# Patient Record
Sex: Female | Born: 1940 | Race: White | Hispanic: No | Marital: Married | Smoking: Former smoker
Health system: Southern US, Community
[De-identification: ages and names within clinical notes are randomized; demographics above are authoritative.]

## PROBLEM LIST (undated history)

## (undated) DIAGNOSIS — M810 Age-related osteoporosis without current pathological fracture: Secondary | ICD-10-CM

## (undated) DIAGNOSIS — R64 Cachexia: Secondary | ICD-10-CM

## (undated) DIAGNOSIS — I10 Essential (primary) hypertension: Secondary | ICD-10-CM

## (undated) DIAGNOSIS — E119 Type 2 diabetes mellitus without complications: Secondary | ICD-10-CM

## (undated) DIAGNOSIS — I639 Cerebral infarction, unspecified: Secondary | ICD-10-CM

## (undated) DIAGNOSIS — Z85818 Personal history of malignant neoplasm of other sites of lip, oral cavity, and pharynx: Secondary | ICD-10-CM

## (undated) HISTORY — PX: ABDOMINAL HYSTERECTOMY: SHX81

## (undated) HISTORY — PX: PAROTIDECTOMY: SUR1003

## (undated) HISTORY — PX: PEG PLACEMENT: SHX5437

## (undated) HISTORY — PX: CHOLECYSTECTOMY: SHX55

## (undated) HISTORY — DX: Age-related osteoporosis without current pathological fracture: M81.0

## (undated) HISTORY — DX: Type 2 diabetes mellitus without complications: E11.9

## (undated) HISTORY — DX: Essential (primary) hypertension: I10

---

## 2002-12-17 HISTORY — PX: ANKLE SURGERY: SHX546

## 2003-01-11 ENCOUNTER — Encounter: Payer: Self-pay | Admitting: Emergency Medicine

## 2003-01-11 ENCOUNTER — Encounter: Payer: Self-pay | Admitting: Orthopaedic Surgery

## 2003-01-11 ENCOUNTER — Inpatient Hospital Stay (HOSPITAL_COMMUNITY): Admission: EM | Admit: 2003-01-11 | Discharge: 2003-01-15 | Payer: Self-pay | Admitting: Emergency Medicine

## 2003-01-12 ENCOUNTER — Encounter: Payer: Self-pay | Admitting: Orthopaedic Surgery

## 2003-12-27 ENCOUNTER — Ambulatory Visit (HOSPITAL_COMMUNITY): Admission: RE | Admit: 2003-12-27 | Discharge: 2003-12-27 | Payer: Self-pay | Admitting: Family Medicine

## 2004-09-06 ENCOUNTER — Ambulatory Visit (HOSPITAL_COMMUNITY): Admission: RE | Admit: 2004-09-06 | Discharge: 2004-09-06 | Payer: Self-pay | Admitting: Family Medicine

## 2005-01-16 ENCOUNTER — Ambulatory Visit (HOSPITAL_COMMUNITY): Admission: RE | Admit: 2005-01-16 | Discharge: 2005-01-16 | Payer: Self-pay | Admitting: Family Medicine

## 2005-06-16 ENCOUNTER — Emergency Department (HOSPITAL_COMMUNITY): Admission: EM | Admit: 2005-06-16 | Discharge: 2005-06-16 | Payer: Self-pay | Admitting: *Deleted

## 2005-06-21 ENCOUNTER — Ambulatory Visit (HOSPITAL_COMMUNITY): Admission: RE | Admit: 2005-06-21 | Discharge: 2005-06-21 | Payer: Self-pay | Admitting: Family Medicine

## 2005-08-14 ENCOUNTER — Ambulatory Visit: Payer: Self-pay | Admitting: Family Medicine

## 2006-05-06 ENCOUNTER — Ambulatory Visit (HOSPITAL_COMMUNITY): Admission: RE | Admit: 2006-05-06 | Discharge: 2006-05-06 | Payer: Self-pay | Admitting: Family Medicine

## 2006-06-21 ENCOUNTER — Ambulatory Visit (HOSPITAL_COMMUNITY): Admission: RE | Admit: 2006-06-21 | Discharge: 2006-06-21 | Payer: Self-pay | Admitting: Gastroenterology

## 2006-06-21 ENCOUNTER — Ambulatory Visit: Payer: Self-pay | Admitting: Gastroenterology

## 2006-10-01 ENCOUNTER — Ambulatory Visit: Payer: Self-pay | Admitting: Family Medicine

## 2007-10-16 ENCOUNTER — Ambulatory Visit: Payer: Self-pay | Admitting: Family Medicine

## 2007-12-18 HISTORY — PX: COLONOSCOPY: SHX174

## 2008-06-16 DIAGNOSIS — M810 Age-related osteoporosis without current pathological fracture: Secondary | ICD-10-CM

## 2008-06-16 HISTORY — DX: Age-related osteoporosis without current pathological fracture: M81.0

## 2008-06-25 ENCOUNTER — Ambulatory Visit (HOSPITAL_COMMUNITY): Admission: RE | Admit: 2008-06-25 | Discharge: 2008-06-25 | Payer: Self-pay | Admitting: Family Medicine

## 2008-11-03 ENCOUNTER — Ambulatory Visit (HOSPITAL_COMMUNITY): Admission: RE | Admit: 2008-11-03 | Discharge: 2008-11-03 | Payer: Self-pay | Admitting: Family Medicine

## 2008-12-15 ENCOUNTER — Ambulatory Visit (HOSPITAL_COMMUNITY): Admission: RE | Admit: 2008-12-15 | Discharge: 2008-12-15 | Payer: Self-pay | Admitting: Family Medicine

## 2009-11-04 ENCOUNTER — Ambulatory Visit (HOSPITAL_COMMUNITY): Admission: RE | Admit: 2009-11-04 | Discharge: 2009-11-04 | Payer: Self-pay | Admitting: Family Medicine

## 2010-01-15 ENCOUNTER — Emergency Department (HOSPITAL_COMMUNITY): Admission: EM | Admit: 2010-01-15 | Discharge: 2010-01-15 | Payer: Self-pay | Admitting: Emergency Medicine

## 2010-11-14 ENCOUNTER — Ambulatory Visit (HOSPITAL_COMMUNITY)
Admission: RE | Admit: 2010-11-14 | Discharge: 2010-11-14 | Payer: Self-pay | Source: Home / Self Care | Admitting: Family Medicine

## 2010-11-28 ENCOUNTER — Ambulatory Visit (HOSPITAL_COMMUNITY)
Admission: RE | Admit: 2010-11-28 | Discharge: 2010-11-28 | Payer: Self-pay | Source: Home / Self Care | Attending: Family Medicine | Admitting: Family Medicine

## 2011-01-06 ENCOUNTER — Encounter: Payer: Self-pay | Admitting: Family Medicine

## 2011-05-04 NOTE — H&P (Signed)
NAME:  Laura Melendez, Laura Melendez                         ACCOUNT NO.:  [id number]   MEDICAL RECORD NO.:  [id number]                   PATIENT TYPE:  INP   LOCATION:  A329                                 FACILITY:  [facility]   PHYSICIAN:  J. Darreld Mclean, M.D.              DATE OF BIRTH:  02/08/1941   DATE OF ADMISSION:  01/11/2003  DATE OF DISCHARGE:                                HISTORY & PHYSICAL   CHIEF COMPLAINT:  I fell and hurt my ankle.   HISTORY:  The patient was at home vacuuming and tripped over her vacuum  cleaner hose and fell and twisted her ankle.  She heard a pop.  She is 70 years old.  No other injuries except for her ankle.  X-rays showed a  displaced, dislocated ankle with trimalleolar fracture on the left.   PAST SURGICAL HISTORY:  1. Cholecystectomy.  2. Appendectomy.  3. Breast surgery, biopsy on the right.  4. Partial hysterectomy.   MEDICATIONS:  She a medicine for hypertension, she does not know the name,  and she takes vitamins.   ALLERGIES:  None known.   PAST MEDICAL HISTORY:  Negative except for the history of hypertension.  She  has no serious medical problems, very active.   FAMILY DOCTOR:  Dr. Hyacinth Meeker in [place], [place] - local physician.   SOCIAL HISTORY:  The patient is married and has a son, and both are present.   PHYSICAL EXAMINATION:  GENERAL:  The patient is alert, cooperative and  oriented.  HEENT:  Negative.  NECK:  Supple.  LUNGS:  Clear to P&A.  HEART:  Regular rhythm without murmur.  ABDOMEN:  Soft, nontender, without masses.  EXTREMITIES:  Left ankle now in a posterior splint, previously relocated it  and closed reduction maneuver.  NEUROVASCULAR SYSTEM:  Intact.  LOWER EXTREMITIES:  Within normal limits.  CNS:  Intact.  SKIN:  Intact.   IMPRESSION:  1. Dislocation left ankle, now reduced.  2. Trimalleolar fracture left ankle for open reduction and internal     fixation.  3. History of hypertension.   PLAN:  Open reduction and  internal fixation of the left ankle.  I have  discussed with the patient the planned procedure, risks and imponderables  including infection, pulmonary embolism which leads to death, need for  physical therapy, use of a Johnstone, limited weightbearing, if any, and  anesthesia risks among other.  The patient appears to understand and agrees  to procedure as outlined.  Lab pending.  Will need a consult with  hospitalist.                                               [name], M.D.    JWK/MEDQ  D:  01/11/2003  T:  01/11/2003  Job:  [id number]

## 2011-05-04 NOTE — Op Note (Signed)
NAME:  [name], [name]                         ACCOUNT NO.:  [id number]   MEDICAL RECORD NO.:  [id number]                   PATIENT TYPE:  INP   LOCATION:  A329                                 FACILITY:  [facility]   PHYSICIAN:  J. Darreld Mclean, M.D.              DATE OF BIRTH:  10-May-1941   DATE OF PROCEDURE:  01/12/2003  DATE OF DISCHARGE:                                 OPERATIVE REPORT   PREOPERATIVE DIAGNOSIS:  Trimalleolar fracture of the left ankle.   POSTOPERATIVE DIAGNOSIS:  Trimalleolar fracture of the left ankle.   PROCEDURE:  Open treatment and internal fixation of left ankle trimalleolar  fracture (this patient has previously had relocation and dislocation of an  ankle done yesterday).   ANESTHESIA:  Spinal.   TOURNIQUET TIME:  30 minutes.   DRAINS:  None.   SPLINTS:  A posterior splint applied, plaster type.   SURGEON:  J. Darreld Mclean, M.D.   ASSISTANT:  Candace Cruise, P.A.   INDICATIONS:  The patient is a 70 year old female who fell over her vacuum  cleaner yesterday afternoon and injured her left ankle.  No other injury  sustained.  Risks and imponderables of the procedure have been discussed  with the patient preoperatively.  She and her husband have both had the  opportunity to ask appropriate questions concerning the procedure.  They  appear to understand.   DESCRIPTION OF PROCEDURE:  The patient was given spinal anesthesia and  placed supine on the operating table.  Tourniquet placed deflated on the  left upper thigh.  Sand bag placed under the padding of the table in the  left hip area.  The patient prepped and draped in the usual manner, leg  elevated, wrapped circumferentially with an Esmarch bandage, tourniquet  inflated to 300 mmHg, the Esmarch bandage removed.  We re-verified we were  doing this patient and the left ankle.  An incision was made medially and  the fracture site  was identified.  It was cleaned of hematoma.  The talus  appeared to  have no injury.  The fracture was anatomically reduced, held in  place with a clamp, and a 0.062 smooth Kirschner wire was placed and then a  3.2 drill was used and a 55 mm long malleolar screw was used.  Attention  directed to the lateral side.  The fracture was identified and debrided of  hematoma, held in place with a clamp, and a 2.5 drill bit was used and then  a 3.5 tap.  A 3.5 and 16 mm long cortical screw was then used.  This held  the fracture in good position.  X-rays taken in the [facility] and lateral views,  and these looked very good.  Showed reduction of the fracture and  restoration of the mortise.  The wound was reapproximated using a 2-0  chromic, 2-0 plain, and skin staples in both sides.  Sterile dressing  applied,  bulky dressing applied.  Tourniquet deflated for 30 minutes.  Posterior splint applied.  The patient tolerated the procedure well and went  to recovery in good condition.  She tolerated it well.                                                [name], M.D.    JWK/MEDQ  D:  01/12/2003  T:  01/12/2003  Job:  [id number]

## 2011-05-04 NOTE — Op Note (Signed)
NAMEHARTLYN, Laura Melendez               ACCOUNT NO.:  [id number]   MEDICAL RECORD NO.:  [id number]          PATIENT TYPE:  AMB   LOCATION:  DAY                           FACILITY:  [facility]   PHYSICIAN:  Kassie Mends, M.D.      DATE OF BIRTH:  [birth year]   DATE OF PROCEDURE:  06/21/2006  DATE OF DISCHARGE:                                 OPERATIVE REPORT   PROCEDURE:  Colonoscopy.   MEDICATIONS:  1.  Demerol 50 mg IV.  2.  Versed 3 mg IV.   ENDOSCOPIST:  Kassie Mends, M.D.   INDICATIONS FOR PROCEDURE:  Laura Melendez is a 70 year old female who presents  for average risk colon cancer screening.   FINDINGS:  1.  Sigmoid colon diverticulosis, mild.  Otherwise no evidence of polyps,      masses, inflammatory changes or vascular ectasias.  2.  Normal retroflexed view of the rectum.   RECOMMENDATIONS:  1.  High fiber diet.  2.  Repeat screening colonoscopy in 10 years.  3.  Recommend halving the dose of metoprolol and Norvasc on the day prior to      her colonoscopy because during the procedure she had episodes of      bradycardia into the 30's and systolic blood pressure 77, which      responded to fluid bolus and stimulation.  4.  Follow up with Dr. Lorin Picket A. Luking.   DESCRIPTION OF PROCEDURE:  The physical exam was performed and an informed  consent was obtained from the patient after explaining all risks,  (perforation, bleeding, infection and adverse effects to the medicines),  benefits and alternatives to the procedure, which the patient appeared to  understand and so stated.  The patient was connected to a monitoring device  and placed in the left lateral position.  Continuous oxygen was provided by  nasal cannula and IV medicine administered through an indwelling cannula.  After the administration of sedation as outlined above,  and a rectal exam, the patient was intubated and the scope was advanced  under direct visualization to the cecum.  The scope was subsequently removed  slowly  by careful exam of the colon texture, anatomy and integrity of the  mucosal layout.  The patient was recovered in the endoscopy suite and  discharged to home in satisfactory condition.      Kassie Mends, M.D.  Electronically Signed     SM/MEDQ  D:  06/21/2006  T:  06/21/2006  Job:  [id number]   cc:   Lorin Picket A. Gerda Diss, MD  Fax: [phone]

## 2011-05-04 NOTE — Discharge Summary (Signed)
   NAME:  Laura Melendez, Laura Melendez                         ACCOUNT NO.:  [id number]   MEDICAL RECORD NO.:  [id number]                   PATIENT TYPE:  INP   LOCATION:  A329                                 FACILITY:  [facility]   PHYSICIAN:  J. Darreld Mclean, M.D.              DATE OF BIRTH:  12-03-1941   DATE OF ADMISSION:  01/11/2003  DATE OF DISCHARGE:  01/15/2003                                 DISCHARGE SUMMARY   DISCHARGE DIAGNOSES:  Trimalleolar fracture of the left ankle.   PROCEDURE:  Open treatment, internal fixation left trimalleolar fracture.   OTHER PROCEDURE:  Relocation of dislocated left ankle.   OTHER DIAGNOSIS:  Dislocated left ankle.   DISCHARGE STATUS:  Improved.   PROGNOSIS:  Good.   DISPOSITION:  Home.   DISCHARGE MEDICATIONS:  Tylox, Lopressor, and hydrochlorothiazide.   DISCHARGE INSTRUCTIONS:  The patient to be seen in my office on January 29, 2003 at 10 a.m.  Home health has been arranged; a [facility] has been provided.   HOSPITAL COURSE:  The patient fell and sustained the above-mentioned injury.  She had a dislocated trimalleolar fracture of the ankle.  The ankle was  relocated in the ER.  The patient was admitted, seen in consultation by Dr.  Letitia Libra, found to be candidate for surgery.  She underwent the surgery on  the day after admission and she tolerated it well.  The next day she was  afebrile.  IVs were discontinued and physical therapy was begun.  The  patient did well in therapy on the first day and the second day she resumed  and did even better.  She was able to go home on January 30.  She was  afebrile, doing well, minimal pain, minimal difficulty.  She is to be seen  in the office as stated.   LABORATORY DATA:  Within normal limits.                                               [name], M.D.    JWK/MEDQ  D:  01/26/2003  T:  01/26/2003  Job:  [id number]

## 2011-11-15 ENCOUNTER — Other Ambulatory Visit: Payer: Self-pay | Admitting: Family Medicine

## 2011-11-15 DIAGNOSIS — Z139 Encounter for screening, unspecified: Secondary | ICD-10-CM

## 2011-11-20 ENCOUNTER — Ambulatory Visit (HOSPITAL_COMMUNITY)
Admission: RE | Admit: 2011-11-20 | Discharge: 2011-11-20 | Disposition: A | Discharge status: Home / Self Care | Payer: Medicare HMO | Source: Ambulatory Visit | Attending: Family Medicine | Admitting: Family Medicine

## 2011-11-20 DIAGNOSIS — Z139 Encounter for screening, unspecified: Secondary | ICD-10-CM

## 2011-11-20 DIAGNOSIS — Z1231 Encounter for screening mammogram for malignant neoplasm of breast: Secondary | ICD-10-CM | POA: Insufficient documentation

## 2012-11-04 ENCOUNTER — Other Ambulatory Visit: Payer: Self-pay | Admitting: Family Medicine

## 2012-11-04 DIAGNOSIS — Z139 Encounter for screening, unspecified: Secondary | ICD-10-CM

## 2012-11-25 ENCOUNTER — Ambulatory Visit (HOSPITAL_COMMUNITY)
Admission: RE | Admit: 2012-11-25 | Discharge: 2012-11-25 | Disposition: A | Discharge status: Home / Self Care | Payer: Medicare Other | Source: Ambulatory Visit | Attending: Family Medicine | Admitting: Family Medicine

## 2012-11-25 DIAGNOSIS — Z1231 Encounter for screening mammogram for malignant neoplasm of breast: Secondary | ICD-10-CM | POA: Insufficient documentation

## 2012-11-25 DIAGNOSIS — Z139 Encounter for screening, unspecified: Secondary | ICD-10-CM

## 2012-11-25 LAB — HM MAMMOGRAPHY

## 2013-01-06 ENCOUNTER — Other Ambulatory Visit: Payer: Self-pay | Admitting: Family Medicine

## 2013-01-06 DIAGNOSIS — M810 Age-related osteoporosis without current pathological fracture: Secondary | ICD-10-CM

## 2013-01-08 ENCOUNTER — Ambulatory Visit (HOSPITAL_COMMUNITY)
Admission: RE | Admit: 2013-01-08 | Discharge: 2013-01-08 | Disposition: A | Discharge status: Home / Self Care | Payer: Medicare Other | Source: Ambulatory Visit | Attending: Family Medicine | Admitting: Family Medicine

## 2013-01-08 DIAGNOSIS — M810 Age-related osteoporosis without current pathological fracture: Secondary | ICD-10-CM

## 2013-02-23 ENCOUNTER — Encounter (HOSPITAL_COMMUNITY): Payer: Self-pay

## 2013-03-04 ENCOUNTER — Encounter (HOSPITAL_COMMUNITY): Payer: Self-pay

## 2013-03-04 ENCOUNTER — Encounter (HOSPITAL_COMMUNITY)
Admission: RE | Admit: 2013-03-04 | Discharge: 2013-03-04 | Disposition: A | Discharge status: Home / Self Care | Payer: Medicare Other | Source: Ambulatory Visit | Attending: Obstetrics & Gynecology | Admitting: Obstetrics & Gynecology

## 2013-03-04 ENCOUNTER — Other Ambulatory Visit: Payer: Self-pay

## 2013-03-04 LAB — BASIC METABOLIC PANEL
BUN: 12 mg/dL (ref 6–23)
CO2: 27 mEq/L (ref 19–32)
Chloride: 101 mEq/L (ref 96–112)
GFR calc Af Amer: >90 mL/min (ref >90)

## 2013-03-04 LAB — CBC
HCT: 41.3 % (ref 36.0–46.0)
Hemoglobin: 13.9 g/dL (ref 12.0–15.0)
MCHC: 33.7 g/dL (ref 30.0–36.0)
Platelets: 237 10*3/uL (ref 150–400)

## 2013-03-04 NOTE — Pre-Procedure Instructions (Signed)
EKG of today (03/04/13) reviewed by Dr Rodman Pickle. More cardiac information needed. Called request to Dr Gerda Diss, [facility].

## 2013-03-04 NOTE — Patient Instructions (Addendum)
20 Laura Melendez  03/04/2013   Your procedure is scheduled on:  03/11/13  Enter through the Main Entrance of [facility] at 6 AM.  Pick up the phone at the desk and dial [phone].   Call this number if you have problems the morning of surgery: [phone]   Remember:   Do not eat food:After Midnight.  Do not drink clear liquids: After Midnight.  Take these medicines the morning of surgery with A SIP OF WATER: all blood pressure medications, hold Metformin for 24hrs prior to surgery   Do not wear jewelry, make-up or nail polish.  Do not wear lotions, powders, or perfumes. You may wear deodorant.  Do not shave 48 hours prior to surgery.  Do not bring valuables to the hospital.  Contacts, dentures or bridgework may not be worn into surgery.  Leave suitcase in the car. After surgery it may be brought to your room.  For patients admitted to the hospital, checkout time is 11:00 AM the day of discharge.   Patients discharged the day of surgery will not be allowed to drive home.  Name and phone number of your driver: NA  Special Instructions: Shower using CHG 2 nights before surgery and the night before surgery.  If you shower the day of surgery use CHG.  Use special wash - you have one bottle of CHG for all showers.  You should use approximately 1/3 of the bottle for each shower.   Please read over the following fact sheets that you were given: Surgical Site Infection Prevention

## 2013-03-05 NOTE — Pre-Procedure Instructions (Signed)
Medications list and EKGs of '07 and 03/04/13 reviewed by Cristela Blue, MD. No instructions or orders given.

## 2013-03-10 ENCOUNTER — Encounter (HOSPITAL_COMMUNITY): Payer: Self-pay | Admitting: Anesthesiology

## 2013-03-10 NOTE — Anesthesia Preprocedure Evaluation (Addendum)
Anesthesia Evaluation  Patient identified by MRN, date of birth, ID band Patient awake    Reviewed: Allergy & Precautions, H&P , NPO status , Patient's Chart, lab work & pertinent test results  Airway Mallampati: II TM Distance: >3 FB Neck ROM: Full    Dental no notable dental hx. (+) Upper Dentures and Partial Lower   Pulmonary neg pulmonary ROS, former smoker,  breath sounds clear to auscultation  Pulmonary exam normal       Cardiovascular hypertension, Pt. on medications and Pt. on home beta blockers Rhythm:Regular Rate:Bradycardia     Neuro/Psych negative neurological ROS  negative psych ROS   GI/Hepatic negative GI ROS, Neg liver ROS,   Endo/Other  diabetes, Well Controlled, Type 2, Oral Hypoglycemic Agents  Renal/GU negative Renal ROS Bladder dysfunction Female GU complaint Vaginal Prolapse Cystocele Rectocele    Musculoskeletal negative musculoskeletal ROS (+)   Abdominal   Peds  Hematology negative hematology ROS (+)   Anesthesia Other Findings   Reproductive/Obstetrics negative OB ROS                          Anesthesia Physical Anesthesia Plan  ASA: II  Anesthesia Plan: Spinal and Combined Spinal and Epidural   Post-op Pain Management:    Induction:   Airway Management Planned: Natural Airway  Additional Equipment:   Intra-op Plan:   Post-operative Plan:   Informed Consent: I have reviewed the patients History and Physical, chart, labs and discussed the procedure including the risks, benefits and alternatives for the proposed anesthesia with the patient or authorized representative who has indicated his/her understanding and acceptance.   Dental advisory given  Plan Discussed with: Anesthesiologist, Surgeon and CRNA  Anesthesia Plan Comments:        Anesthesia Quick Evaluation

## 2013-03-10 NOTE — H&P (Signed)
Laura Melendez is an 72 y.o. female with cystocele desiring definitive management.  Patient declines conservative measures.  UDS showed OAB; unable to prove stress incontinence.    Pertinent Gynecological History: Menses: post-menopausal Bleeding: n/a Contraception: status post hysterectomy DES exposure: unknown Blood transfusions: none Sexually transmitted diseases: no past history Previous GYN Procedures: hysterectomy  Last mammogram: normal Date: 2014 Last pap: normal Date: 2013 OB History: G3, P4  Menstrual History: Menarche age: n/a No LMP recorded. Patient has had a hysterectomy.    Past Medical History  Diagnosis Date  . Hypertension   . Diabetes mellitus without complication     Past Surgical History  Procedure Laterality Date  . Cholecystectomy    . Ankle surgery    . Abdominal hysterectomy      No family history on file.  Social History:  reports that she has quit smoking. She does not have any smokeless tobacco history on file. She reports that she does not drink alcohol or use illicit drugs.  Allergies: No Known Allergies  No prescriptions prior to admission    ROS  There were no vitals taken for this visit. Physical Exam  Constitutional: She is oriented to person, place, and time. She appears well-developed and well-nourished.  Cardiovascular: Normal rate and regular rhythm.   Respiratory: Effort normal and breath sounds normal.  GI: Soft. Bowel sounds are normal. There is no tenderness. There is no rebound and no guarding.  Neurological: She is alert and oriented to person, place, and time.  Skin: Skin is warm and dry.  Psychiatric: She has a normal mood and affect. Her behavior is normal.    No results found for this or any previous visit (from the past 24 hour(s)).  No results found.  Assessment/Plan: 71 yo with symptomatic pelvic organ prolapse -Anterior/posterior colporrhaphy, sacrospinous ligament fixation  Laura Melendez 03/10/2013,  7:52 PM

## 2013-03-11 ENCOUNTER — Observation Stay (HOSPITAL_COMMUNITY)
Admission: RE | Admit: 2013-03-11 | Discharge: 2013-03-12 | Disposition: A | Discharge status: Home / Self Care | Payer: Medicare Other | Source: Ambulatory Visit | Attending: Obstetrics & Gynecology | Admitting: Obstetrics & Gynecology

## 2013-03-11 ENCOUNTER — Encounter (HOSPITAL_COMMUNITY): Payer: Self-pay | Admitting: *Deleted

## 2013-03-11 ENCOUNTER — Encounter (HOSPITAL_COMMUNITY)
Admission: RE | Disposition: A | Discharge status: Home / Self Care | Payer: Self-pay | Source: Ambulatory Visit | Attending: Obstetrics & Gynecology

## 2013-03-11 ENCOUNTER — Encounter (HOSPITAL_COMMUNITY): Payer: Self-pay | Admitting: Anesthesiology

## 2013-03-11 ENCOUNTER — Ambulatory Visit (HOSPITAL_COMMUNITY): Payer: Medicare Other | Admitting: Anesthesiology

## 2013-03-11 DIAGNOSIS — I10 Essential (primary) hypertension: Secondary | ICD-10-CM | POA: Insufficient documentation

## 2013-03-11 DIAGNOSIS — N993 Prolapse of vaginal vault after hysterectomy: Principal | ICD-10-CM | POA: Insufficient documentation

## 2013-03-11 DIAGNOSIS — E119 Type 2 diabetes mellitus without complications: Secondary | ICD-10-CM | POA: Insufficient documentation

## 2013-03-11 HISTORY — PX: ANTERIOR AND POSTERIOR REPAIR: SHX5121

## 2013-03-11 LAB — BASIC METABOLIC PANEL
BUN: 9 mg/dL (ref 6–23)
Chloride: 103 mEq/L (ref 96–112)
Creatinine, Ser: 0.49 mg/dL — ABNORMAL LOW (ref 0.50–1.10)
GFR calc Af Amer: >90 mL/min (ref >90)

## 2013-03-11 LAB — CBC
HCT: 34.9 % — ABNORMAL LOW (ref 36.0–46.0)
MCHC: 33 g/dL (ref 30.0–36.0)
MCV: 82.1 fL (ref 78.0–100.0)
RDW: 14.2 % (ref 11.5–15.5)
WBC: 11.5 10*3/uL — ABNORMAL HIGH (ref 4.0–10.5)

## 2013-03-11 LAB — GLUCOSE, CAPILLARY: Glucose-Capillary: 134 mg/dL — ABNORMAL HIGH (ref 70–99)

## 2013-03-11 SURGERY — ANTERIOR (CYSTOCELE) AND POSTERIOR REPAIR (RECTOCELE)
Anesthesia: Spinal | Site: Vagina | Wound class: Clean Contaminated

## 2013-03-11 MED ORDER — NALOXONE HCL 0.4 MG/ML IJ SOLN: 0.4000 mg | INTRAMUSCULAR | Status: DC | PRN

## 2013-03-11 MED ORDER — ONDANSETRON HCL 4 MG/2ML IJ SOLN
4.0000 mg | Freq: Three times a day (TID) | INTRAMUSCULAR | Status: DC | PRN
  Filled 2013-03-11: qty 2

## 2013-03-11 MED ORDER — KCL-LACTATED RINGERS-D5W 20 MEQ/L IV SOLN
INTRAVENOUS | Status: DC
  Administered 2013-03-11 – 2013-03-12 (×3): via INTRAVENOUS
  Filled 2013-03-11 (×6): qty 1000

## 2013-03-11 MED ORDER — DOCUSATE SODIUM 100 MG PO CAPS
100.0000 mg | ORAL_CAPSULE | Freq: Two times a day (BID) | ORAL | Status: DC
  Administered 2013-03-11 – 2013-03-12 (×3): 100 mg via ORAL
  Filled 2013-03-11 (×3): qty 1

## 2013-03-11 MED ORDER — NALBUPHINE HCL 10 MG/ML IJ SOLN
5.0000 mg | INTRAMUSCULAR | Status: DC | PRN
  Filled 2013-03-11: qty 1

## 2013-03-11 MED ORDER — LACTATED RINGERS IV SOLN
INTRAVENOUS | Status: DC | PRN
  Administered 2013-03-11 (×2): via INTRAVENOUS

## 2013-03-11 MED ORDER — LIDOCAINE HCL (CARDIAC) 20 MG/ML IV SOLN
INTRAVENOUS | Status: AC
  Filled 2013-03-11: qty 5

## 2013-03-11 MED ORDER — METOCLOPRAMIDE HCL 5 MG/ML IJ SOLN: 10.0000 mg | Freq: Three times a day (TID) | INTRAMUSCULAR | Status: DC | PRN

## 2013-03-11 MED ORDER — DIPHENHYDRAMINE HCL 25 MG PO CAPS
25.0000 mg | ORAL_CAPSULE | ORAL | Status: DC | PRN
  Filled 2013-03-11: qty 1

## 2013-03-11 MED ORDER — PROMETHAZINE HCL 25 MG/ML IJ SOLN: 12.5000 mg | Freq: Four times a day (QID) | INTRAMUSCULAR | Status: DC | PRN

## 2013-03-11 MED ORDER — FENTANYL CITRATE 0.05 MG/ML IJ SOLN
INTRAMUSCULAR | Status: AC
  Filled 2013-03-11: qty 5

## 2013-03-11 MED ORDER — ONDANSETRON HCL 4 MG/2ML IJ SOLN
INTRAMUSCULAR | Status: AC
  Filled 2013-03-11: qty 2

## 2013-03-11 MED ORDER — MEPERIDINE HCL 25 MG/ML IJ SOLN: 6.2500 mg | INTRAMUSCULAR | Status: DC | PRN

## 2013-03-11 MED ORDER — FENTANYL CITRATE 0.05 MG/ML IJ SOLN: 25.0000 ug | INTRAMUSCULAR | Status: DC | PRN

## 2013-03-11 MED ORDER — NALOXONE HCL 1 MG/ML IJ SOLN
1.0000 ug/kg/h | INTRAVENOUS | Status: DC | PRN
  Filled 2013-03-11: qty 2

## 2013-03-11 MED ORDER — ONDANSETRON HCL 4 MG PO TABS: 4.0000 mg | ORAL_TABLET | Freq: Four times a day (QID) | ORAL | Status: DC | PRN

## 2013-03-11 MED ORDER — LIDOCAINE-EPINEPHRINE (PF) 1 %-1:200000 IJ SOLN
INTRAMUSCULAR | Status: DC | PRN
  Administered 2013-03-11: 6 mL

## 2013-03-11 MED ORDER — MIDAZOLAM HCL 2 MG/2ML IJ SOLN
INTRAMUSCULAR | Status: AC
  Filled 2013-03-11: qty 2

## 2013-03-11 MED ORDER — CEFAZOLIN SODIUM-DEXTROSE 2-3 GM-% IV SOLR
INTRAVENOUS | Status: AC
  Filled 2013-03-11: qty 50

## 2013-03-11 MED ORDER — LIDOCAINE-EPINEPHRINE (PF) 1 %-1:200000 IJ SOLN
INTRAMUSCULAR | Status: AC
  Filled 2013-03-11: qty 10

## 2013-03-11 MED ORDER — SCOPOLAMINE 1 MG/3DAYS TD PT72: 1.0000 | MEDICATED_PATCH | Freq: Once | TRANSDERMAL | Status: DC

## 2013-03-11 MED ORDER — CEFAZOLIN SODIUM-DEXTROSE 2-3 GM-% IV SOLR
2.0000 g | INTRAVENOUS | Status: AC
  Administered 2013-03-11: 2 g via INTRAVENOUS

## 2013-03-11 MED ORDER — ESTRADIOL 0.1 MG/GM VA CREA
TOPICAL_CREAM | VAGINAL | Status: AC
  Filled 2013-03-11: qty 42.5

## 2013-03-11 MED ORDER — 0.9 % SODIUM CHLORIDE (POUR BTL) OPTIME
TOPICAL | Status: DC | PRN
  Administered 2013-03-11: 1000 mL

## 2013-03-11 MED ORDER — MENTHOL 3 MG MT LOZG: 1.0000 | LOZENGE | OROMUCOSAL | Status: DC | PRN

## 2013-03-11 MED ORDER — OXYCODONE-ACETAMINOPHEN 5-325 MG PO TABS
1.0000 | ORAL_TABLET | ORAL | Status: DC | PRN
  Administered 2013-03-12: 1 via ORAL
  Filled 2013-03-11: qty 1

## 2013-03-11 MED ORDER — SODIUM CHLORIDE 0.9 % IJ SOLN: 3.0000 mL | INTRAMUSCULAR | Status: DC | PRN

## 2013-03-11 MED ORDER — METOPROLOL TARTRATE 50 MG PO TABS
50.0000 mg | ORAL_TABLET | Freq: Two times a day (BID) | ORAL | Status: DC
  Administered 2013-03-11 – 2013-03-12 (×2): 50 mg via ORAL
  Filled 2013-03-11 (×4): qty 1

## 2013-03-11 MED ORDER — KETOROLAC TROMETHAMINE 30 MG/ML IJ SOLN
30.0000 mg | Freq: Four times a day (QID) | INTRAMUSCULAR | Status: AC | PRN
  Administered 2013-03-11: 30 mg via INTRAVENOUS
  Filled 2013-03-11: qty 1

## 2013-03-11 MED ORDER — FENTANYL CITRATE 0.05 MG/ML IJ SOLN
INTRAMUSCULAR | Status: DC | PRN
  Administered 2013-03-11: 50 ug via INTRAVENOUS

## 2013-03-11 MED ORDER — DIPHENHYDRAMINE HCL 50 MG/ML IJ SOLN: 25.0000 mg | INTRAMUSCULAR | Status: DC | PRN

## 2013-03-11 MED ORDER — IBUPROFEN 400 MG PO TABS: 400.0000 mg | ORAL_TABLET | Freq: Four times a day (QID) | ORAL | Status: DC | PRN

## 2013-03-11 MED ORDER — GLYCOPYRROLATE 0.2 MG/ML IJ SOLN
INTRAMUSCULAR | Status: DC | PRN
  Administered 2013-03-11: 0.2 mg via INTRAVENOUS

## 2013-03-11 MED ORDER — IBUPROFEN 600 MG PO TABS
600.0000 mg | ORAL_TABLET | Freq: Four times a day (QID) | ORAL | Status: DC | PRN
  Administered 2013-03-12: 600 mg via ORAL
  Filled 2013-03-11: qty 1

## 2013-03-11 MED ORDER — PROPOFOL 10 MG/ML IV EMUL
INTRAVENOUS | Status: AC
  Filled 2013-03-11: qty 20

## 2013-03-11 MED ORDER — DIPHENHYDRAMINE HCL 50 MG/ML IJ SOLN: 12.5000 mg | INTRAMUSCULAR | Status: DC | PRN

## 2013-03-11 MED ORDER — KETOROLAC TROMETHAMINE 60 MG/2ML IM SOLN
60.0000 mg | Freq: Once | INTRAMUSCULAR | Status: AC | PRN
  Filled 2013-03-11: qty 2

## 2013-03-11 MED ORDER — GLYCOPYRROLATE 0.2 MG/ML IJ SOLN
INTRAMUSCULAR | Status: AC
  Filled 2013-03-11: qty 1

## 2013-03-11 MED ORDER — LACTATED RINGERS IV SOLN
INTRAVENOUS | Status: DC
  Administered 2013-03-11: 06:00:00 via INTRAVENOUS

## 2013-03-11 MED ORDER — DEXTROSE IN LACTATED RINGERS 5 % IV SOLN: INTRAVENOUS | Status: DC

## 2013-03-11 MED ORDER — PROPOFOL INFUSION 10 MG/ML OPTIME
INTRAVENOUS | Status: DC | PRN
  Administered 2013-03-11: 100 ug/kg/min via INTRAVENOUS

## 2013-03-11 MED ORDER — SIMETHICONE 80 MG PO CHEW: 80.0000 mg | CHEWABLE_TABLET | Freq: Four times a day (QID) | ORAL | Status: DC | PRN

## 2013-03-11 MED ORDER — KETOROLAC TROMETHAMINE 30 MG/ML IJ SOLN: 30.0000 mg | Freq: Four times a day (QID) | INTRAMUSCULAR | Status: AC | PRN

## 2013-03-11 MED ORDER — HYDROMORPHONE HCL PF 1 MG/ML IJ SOLN: 0.2000 mg | INTRAMUSCULAR | Status: DC | PRN

## 2013-03-11 MED ORDER — MORPHINE SULFATE 0.5 MG/ML IJ SOLN
INTRAMUSCULAR | Status: AC
  Filled 2013-03-11: qty 10

## 2013-03-11 MED ORDER — ONDANSETRON HCL 4 MG/2ML IJ SOLN
4.0000 mg | Freq: Four times a day (QID) | INTRAMUSCULAR | Status: DC | PRN
  Administered 2013-03-11: 4 mg via INTRAVENOUS

## 2013-03-11 MED ORDER — SCOPOLAMINE 1 MG/3DAYS TD PT72
MEDICATED_PATCH | TRANSDERMAL | Status: AC
  Administered 2013-03-11: 1.5 mg via TRANSDERMAL
  Filled 2013-03-11: qty 1

## 2013-03-11 SURGICAL SUPPLY — 32 items
BANDAGE GAUZE ELAST BULKY 4 IN (GAUZE/BANDAGES/DRESSINGS) IMPLANT
BLADE SURG 11 STRL SS (BLADE) ×2 IMPLANT
BLADE SURG 15 STRL LF C SS BP (BLADE) ×1 IMPLANT
BLADE SURG 15 STRL SS (BLADE) ×1
CATH ROBINSON RED A/P 16FR (CATHETERS) ×2 IMPLANT
CLOTH BEACON ORANGE TIMEOUT ST (SAFETY) ×2 IMPLANT
DECANTER SPIKE VIAL GLASS SM (MISCELLANEOUS) ×2 IMPLANT
DERMABOND ADVANCED (GAUZE/BANDAGES/DRESSINGS) ×1
DERMABOND ADVANCED .7 DNX12 (GAUZE/BANDAGES/DRESSINGS) ×1 IMPLANT
DEVICE CAPIO SLIM SINGLE (INSTRUMENTS) ×2 IMPLANT
GLOVE BIO SURGEON STRL SZ 6 (GLOVE) ×4 IMPLANT
GLOVE BIO SURGEON STRL SZ7 (GLOVE) ×2 IMPLANT
GLOVE BIOGEL PI IND STRL 6 (GLOVE) ×2 IMPLANT
GLOVE BIOGEL PI INDICATOR 6 (GLOVE) ×2
GLOVE ECLIPSE 6.5 STRL STRAW (GLOVE) ×2 IMPLANT
GLOVE INDICATOR 7.0 STRL GRN (GLOVE) ×4 IMPLANT
GOWN STRL REIN XL XLG (GOWN DISPOSABLE) ×8 IMPLANT
NEEDLE HYPO 22GX1.5 SAFETY (NEEDLE) ×2 IMPLANT
NEEDLE MAYO 6 CRC TAPER PT (NEEDLE) ×2 IMPLANT
NS IRRIG 1000ML POUR BTL (IV SOLUTION) ×2 IMPLANT
PACK VAGINAL WOMENS (CUSTOM PROCEDURE TRAY) ×2 IMPLANT
PENCIL BUTTON HOLSTER BLD 10FT (ELECTRODE) ×2 IMPLANT
SET CYSTO W/LG BORE CLAMP LF (SET/KITS/TRAYS/PACK) ×2 IMPLANT
SUT CAPIO POLYGLYCOLIC (SUTURE) ×2 IMPLANT
SUT MON AB 3-0 SH 27 (SUTURE)
SUT MON AB 3-0 SH27 (SUTURE) IMPLANT
SUT VIC AB 2-0 CT1 27 (SUTURE) ×3
SUT VIC AB 2-0 CT1 TAPERPNT 27 (SUTURE) ×3 IMPLANT
SUT VIC AB 2-0 CT2 27 (SUTURE) ×20 IMPLANT
TOWEL OR 17X24 6PK STRL BLUE (TOWEL DISPOSABLE) ×4 IMPLANT
TRAY FOLEY CATH 14FR (SET/KITS/TRAYS/PACK) ×2 IMPLANT
WATER STERILE IRR 1000ML POUR (IV SOLUTION) IMPLANT

## 2013-03-11 NOTE — Anesthesia Postprocedure Evaluation (Signed)
Anesthesia Post Note  Patient: Laura Melendez  Procedure(s) Performed: Procedure(s) (LRB): ANTERIOR (CYSTOCELE) AND POSTERIOR REPAIR (RECTOCELE) (N/A)  Anesthesia type: Spinal  Patient location: Womens Unit  Post pain: Pain level controlled  Post assessment: Post-op Vital signs reviewed  Last Vitals:  Filed Vitals:   03/11/13 1230  BP: 138/45  Pulse: 57  Temp: 36.4 C  Resp: 16    Post vital signs: Reviewed  Level of consciousness: awake  Complications: No apparent anesthesia complications

## 2013-03-11 NOTE — Progress Notes (Signed)
No changes to H&P.  Aundra Millet Yamilee Harmes DO

## 2013-03-11 NOTE — Transfer of Care (Signed)
Immediate Anesthesia Transfer of Care Note  Patient: Laura Melendez  Procedure(s) Performed: Procedure(s) with comments: ANTERIOR (CYSTOCELE) AND POSTERIOR REPAIR (RECTOCELE) (N/A) - with sacrospinous ligament fixation bilateral  Patient Location: PACU  Anesthesia Type:Spinal  Level of Consciousness: sedated  Airway & Oxygen Therapy: Patient Spontanous Breathing  Post-op Assessment: Report given to PACU RN  Post vital signs: Reviewed and stable  Complications: No apparent anesthesia complications

## 2013-03-11 NOTE — Preoperative (Signed)
Beta Blockers   Reason not to administer Beta Blockers:Not Applicable 

## 2013-03-11 NOTE — Anesthesia Postprocedure Evaluation (Signed)
  Anesthesia Post-op Note  Patient: Laura Melendez  Procedure(s) Performed: Procedure(s) with comments: ANTERIOR (CYSTOCELE) AND POSTERIOR REPAIR (RECTOCELE) (N/A) - with sacrospinous ligament fixation bilateral   Patient is awake, responsive, moving her legs, and has signs of resolution of her numbness. Pain and nausea are reasonably well controlled. Vital signs are stable and clinically acceptable. Oxygen saturation is clinically acceptable. There are no apparent anesthetic complications at this time. Patient is ready for discharge.

## 2013-03-11 NOTE — Anesthesia Procedure Notes (Signed)
Spinal  Patient location during procedure: OR Preanesthetic Checklist Completed: patient identified, site marked, surgical consent, pre-op evaluation, timeout performed, IV checked, risks and benefits discussed and monitors and equipment checked Spinal Block Patient position: sitting Prep: DuraPrep Patient monitoring: heart rate, cardiac monitor, continuous pulse ox and blood pressure Approach: midline Location: L3-4 Injection technique: single-shot Needle Needle type: Sprotte  Needle gauge: 24 G Needle length: 9 cm Assessment Sensory level: T12 Additional Notes Spinal Dosage in OR  Bupivicaine ml       1.3 PFMS04   mcg        100  (-) asp CSF at end of injection. Dr Rodman Pickle aware of ? Dose received by pt.

## 2013-03-11 NOTE — Progress Notes (Signed)
C/o nausea and vomiting with movement/activity.  No pain.  No CP/SOB.  Requesting eggs and toast. VSS.  AF.  fsbs all <200 Gen: A&Ox3 Abd: soft, ND/NT Ext: no c/c/e, SCDs 72yo POD#0 s/p A&P repair, SSLF -Improve nausea control.  Will allow bedrest until AM.  Add phenergan. -Continue pain control -D/C vag pack, Foley, IV in AM -DM-will monitor blood sugar and tx prn -Will order requested diet

## 2013-03-12 ENCOUNTER — Encounter (HOSPITAL_COMMUNITY): Payer: Self-pay | Admitting: Obstetrics & Gynecology

## 2013-03-12 LAB — BASIC METABOLIC PANEL
CO2: 27 mEq/L (ref 19–32)
Calcium: 8.9 mg/dL (ref 8.4–10.5)
Glucose, Bld: 156 mg/dL — ABNORMAL HIGH (ref 70–99)
Potassium: 4.1 mEq/L (ref 3.5–5.1)
Sodium: 138 mEq/L (ref 135–145)

## 2013-03-12 LAB — CBC
Hemoglobin: 10.8 g/dL — ABNORMAL LOW (ref 12.0–15.0)
MCH: 26.8 pg (ref 26.0–34.0)
RBC: 4.03 MIL/uL (ref 3.87–5.11)

## 2013-03-12 MED ORDER — DSS 100 MG PO CAPS: 100.0000 mg | ORAL_CAPSULE | Freq: Two times a day (BID) | ORAL | Status: DC

## 2013-03-12 MED ORDER — IBUPROFEN 600 MG PO TABS: 600.0000 mg | ORAL_TABLET | Freq: Four times a day (QID) | ORAL | Status: DC | PRN

## 2013-03-12 MED ORDER — OXYCODONE-ACETAMINOPHEN 5-325 MG PO TABS: 1.0000 | ORAL_TABLET | ORAL | Status: DC | PRN

## 2013-03-12 NOTE — Progress Notes (Signed)
Pain and nausea well-controlled. Tol po.  Foley out and voiding well.  Ambulating well.  Wants to go home. VSS.  AF.  Labs stable.   POD 1  -D/C home -F/U in 2 weeks -Encouraged bowel regimen

## 2013-03-12 NOTE — Discharge Summary (Signed)
Physician Discharge Summary  Patient ID: Laura Melendez MRN: [id number] DOB/AGE: 08/29/1941 72 y.o.  Admit date: 03/11/2013 Discharge date: 03/12/2013  Admission Diagnoses: Pelvic organ prolapse  Discharge Diagnoses: SAA Active Problems:   * No active hospital problems. *   Discharged Condition: good  Hospital Course: Admitted for Anterior and posterior colporrhaphy and sacrospinous ligament fixation.  Procedure was performed without complication and the patient tolerated the procedure well.  Postoperatively, the patient had some anesthesia-related nausea and vomiting which resolved by postop day 1.  By postop day1, the patient was meeting all goals and was discharged home in stable condition.   Consults: None  Significant Diagnostic Studies: none  Treatments: IV hydration, analgesia: Percocet and surgery: A&P repair, SSLF  Discharge Exam: Blood pressure 142/50, pulse 62, temperature 97.7 F (36.5 C), temperature source Oral, resp. rate 16, height 5' (1.524 m), weight 104 lb (47.174 kg), SpO2 95.00%. General appearance: alert, cooperative and appears stated age GI: soft, non-tender; bowel sounds normal; no masses,  no organomegaly Pelvic: well-healing repair, scant blood on peripad Extremities: extremities normal, atraumatic, no cyanosis or edema  Disposition:      Medication List    TAKE these medications       CALCIUM 600/VITAMIN D3 600-800 MG-UNIT Tabs  Generic drug:  Calcium Carb-Cholecalciferol  Take 1 tablet by mouth 2 (two) times daily with a meal.     CINNAMON PO  Take 1,000 mg by mouth daily.     CRANBERRY PO  Take 84 mg by mouth daily.     DSS 100 MG Caps  Take 100 mg by mouth 2 (two) times daily.     FISH OIL CONCENTRATE 300 MG Caps  Take 1 capsule by mouth daily.     ibuprofen 600 MG tablet  Commonly known as:  ADVIL,MOTRIN  Take 1 tablet (600 mg total) by mouth every 6 (six) hours as needed (mild pain).     lisinopril 2.5 MG tablet  Commonly  known as:  PRINIVIL,ZESTRIL  Take 2.5 mg by mouth daily.     metFORMIN 500 MG tablet  Commonly known as:  GLUCOPHAGE  Take 500 mg by mouth 2 (two) times daily with a meal.     metoprolol 50 MG tablet  Commonly known as:  LOPRESSOR  Take 50 mg by mouth 2 (two) times daily.     multivitamin with minerals Tabs  Take 1 tablet by mouth daily.     NIFEdipine 60 MG 24 hr tablet  Commonly known as:  PROCARDIA XL/ADALAT-CC  Take 60 mg by mouth daily.     oxyCODONE-acetaminophen 5-325 MG per tablet  Commonly known as:  PERCOCET/ROXICET  Take 1-2 tablets by mouth every 4 (four) hours as needed.     raloxifene 60 MG tablet  Commonly known as:  EVISTA  Take 60 mg by mouth daily.     Vitamin D3 400 UNITS Caps  Take 1 capsule by mouth 3 (three) times daily.         SignedMitchel Honour 03/12/2013, 1:38 PM

## 2013-03-12 NOTE — Op Note (Signed)
PREOPERATIVE DIAGNOSIS:  Pelvic organ prolapse s/p hysterectomy POSTOPERATIVE DIAGNOSIS: The same PROCEDURE: Anterior and posterior colporrhaphy, bilateral sacrospinous ligament fixation SURGEON:  Dr. Mitchel Honour ASSISTANT:  Dr. Richardean Chimera   INDICATIONS: 72 y.o. G3P4 here for scheduled surgery for A/P repair, bilateral sacrospinous ligament fixation.   Risks of surgery were discussed with the patient including but not limited to: bleeding which may require transfusion; infection which may require antibiotics; injury to surrounding organs; need for additional procedures including laparotomy or laparoscopy; failure of repair; and other postoperative/anesthesia complications. Written informed consent was obtained.    FINDINGS:  Stage III cystocele, Stage II rectocele, and poor apical support  ANESTHESIA:   General ESTIMATED BLOOD LOSS:  150cc SPECIMENS: None COMPLICATIONS:  None immediate  PROCEDURE DETAILS:  The patient received intravenous antibiotics while in the preoperative area.  She was then taken to the operating room where spinal anesthesia was administered and was found to be adequate.  After an adequate timeout was performed, she was placed in the dorsal lithotomy position and examined; then prepped and draped in the sterile manner.   Her bladder was catheterized for 75cc of clear, yellow urine. An Allis clamp was placed approximately 1 cm from the distal urethral meatus and a second Allis at the apex.  The vagina was then opened using an 11 blade. The bladder was then sharply dissected free from the overlying vaginal mucosa. Pubovesical fascia was identified and an initial suture at the urethrovesical angle was placed using 0 Monocryl in a transverse position through that fascial plane. The remainder of the cystocele was reduced with several interrupted 0 Monocryl. Redundant vaginal tissue was excised and the vagina closed in midline with running locked 0 Monocryl.   The bladder was then  retracted superiorly. The hymenal ring was grasped at the 4 and 8 o'clock positions. A triangular incision was made to within a centimeter of the anus. The intervening tissue was excised. The vagina was then opened in the midline to within a centimeter of the apex. The perineal body was freed and the rectum freed from the overlying vagina.  Blunt dissection was used to dissect down to the ischial spine bilaterally.  Perirectal fascia was then transversely sutured in the midline with several interrupted 0 Monocryl for support. Next, after palpation of the sacrospinous ligament was ensured bilaterally, the Capio device was opened. The Capio was advanced to the ligament, first on the right side, secured against the ligament, then deployed.  The left side was treated in the same fashion.  Good support was noted bilaterally when traction was applied to the suture.  Closure of the proximal half of the vaginal tissue was performed in a running locked fashion using 0 Monocryl.  Each side was tied down elevating the vaginal apex.  The remainder of the vaginal closure was performed including the perineal repair in an imbricating followed by subcuticular fashion.  Rectal exam was negative. Hemostasis was noted.  The patient tolerated the procedure well. A Foley catheter was placed. Saline soaked Kerlix was packed into the vagina. The patient was sent to recovery in good condition.

## 2013-03-12 NOTE — Progress Notes (Signed)
No nausea.  Tolerating po.  Minimal ambulation.  Poor pain control this am.  ?Flatus. VSS. AF. fsbs all <200 UOP adequate Gen: A&Ox3 Abd: soft, NT/ND Pelvic: vag pack removed; well healing repair 72yo POD#1 s/p A/P repair and bilateral SSLF -Improve pain control -Will d/c foley, IVF and ambulate -Continue to advance diet -DM-well controlled -Reassess midday and possible d/c home

## 2013-09-14 ENCOUNTER — Telehealth: Payer: Self-pay | Admitting: Family Medicine

## 2013-09-14 DIAGNOSIS — R7303 Prediabetes: Secondary | ICD-10-CM

## 2013-09-14 DIAGNOSIS — Z79899 Other long term (current) drug therapy: Secondary | ICD-10-CM

## 2013-09-14 DIAGNOSIS — E782 Mixed hyperlipidemia: Secondary | ICD-10-CM

## 2013-09-14 NOTE — Telephone Encounter (Signed)
Patient needs BW paperwork °

## 2013-09-14 NOTE — Telephone Encounter (Signed)
Please do met 7, HgA1C, lipid and urine micro, thanks

## 2013-09-14 NOTE — Telephone Encounter (Signed)
Per protocol Met 7- patient has hypertension and prediabetes

## 2013-09-15 NOTE — Telephone Encounter (Signed)
Blood work orders placed in Epic. Patient notified. 

## 2013-09-17 LAB — HEPATIC FUNCTION PANEL
ALT: 18 U/L (ref 0–35)
AST: 23 U/L (ref 0–37)
Albumin: 4.3 g/dL (ref 3.5–5.2)

## 2013-09-17 LAB — LIPID PANEL
HDL: 46 mg/dL (ref >39)
LDL Cholesterol: 82 mg/dL (ref 0–99)
Total CHOL/HDL Ratio: 3.7 Ratio
Triglycerides: 214 mg/dL — ABNORMAL HIGH (ref <150)
VLDL: 43 mg/dL — ABNORMAL HIGH (ref 0–40)

## 2013-09-17 LAB — BASIC METABOLIC PANEL
CO2: 27 mEq/L (ref 19–32)
Chloride: 104 mEq/L (ref 96–112)
Glucose, Bld: 119 mg/dL — ABNORMAL HIGH (ref 70–99)
Potassium: 4.2 mEq/L (ref 3.5–5.3)
Sodium: 142 mEq/L (ref 135–145)

## 2013-09-19 ENCOUNTER — Encounter: Payer: Self-pay | Admitting: *Deleted

## 2013-09-24 ENCOUNTER — Ambulatory Visit (INDEPENDENT_AMBULATORY_CARE_PROVIDER_SITE_OTHER): Payer: Medicare Other | Admitting: Family Medicine

## 2013-09-24 ENCOUNTER — Encounter: Payer: Self-pay | Admitting: Family Medicine

## 2013-09-24 VITALS — BP 118/72 | Ht 60.0 in | Wt 103.0 lb

## 2013-09-24 DIAGNOSIS — Z23 Encounter for immunization: Secondary | ICD-10-CM

## 2013-09-24 MED ORDER — RALOXIFENE HCL 60 MG PO TABS: 60.0000 mg | ORAL_TABLET | Freq: Every day | ORAL | Status: DC

## 2013-09-24 MED ORDER — NIFEDIPINE ER OSMOTIC RELEASE 60 MG PO TB24: 60.0000 mg | ORAL_TABLET | Freq: Every day | ORAL | Status: DC

## 2013-09-24 MED ORDER — ALENDRONATE SODIUM 70 MG PO TABS: 70.0000 mg | ORAL_TABLET | ORAL | Status: DC

## 2013-09-24 MED ORDER — LISINOPRIL 2.5 MG PO TABS: 2.5000 mg | ORAL_TABLET | Freq: Every day | ORAL | Status: DC

## 2013-09-24 MED ORDER — METOPROLOL TARTRATE 50 MG PO TABS: 50.0000 mg | ORAL_TABLET | Freq: Two times a day (BID) | ORAL | Status: DC

## 2013-09-24 MED ORDER — METFORMIN HCL 500 MG PO TABS: 500.0000 mg | ORAL_TABLET | Freq: Two times a day (BID) | ORAL | Status: DC

## 2013-09-24 NOTE — Progress Notes (Signed)
  Subjective:    Patient ID: Laura Melendez, female    DOB: 1941-04-16, 72 y.o.   MRN: [id number]  HPI Patient arrives for a medicare wellness exam.  Mini Cog- completed and pass Get up and Go test-completed and passed-no falls in last 6 mths  AWV- Annual Wellness Visit  The patient was seen for their annual wellness visit. The patient's past medical history, surgical history, and family history were reviewed. Pertinent vaccines were reviewed ( tetanus, pneumonia, shingles, flu) The patient's medication list was reviewed and updated. The height and weight were entered. The patient's current BMI is:  Cognitive screening was completed. Outcome of Mini - WUJ:[id number]  Falls within the past 6 months:none Current tobacco usage:none (All patients who use tobacco were given written and verbal information on quitting)  Recent listing of emergency department/hospitalizations over the past year were reviewed.  current specialist the patient sees on a regular basis:   Medicare annual wellness visit patient questionnaire was reviewed.  A written screening schedule for the patient for the next 5-10 years was given. Appropriate discussion of followup regarding next visit was discussed.       Review of Systems  Constitutional: Negative for activity change, appetite change and fatigue.  HENT: Negative for congestion, ear discharge and rhinorrhea.   Eyes: Negative for discharge.  Respiratory: Negative for cough, chest tightness and wheezing.   Cardiovascular: Negative for chest pain.  Gastrointestinal: Negative for vomiting and abdominal pain.  Genitourinary: Negative for frequency and difficulty urinating.  Musculoskeletal: Negative for neck pain.  Allergic/Immunologic: Negative for environmental allergies and food allergies.  Neurological: Negative for weakness and headaches.  Psychiatric/Behavioral: Negative for behavioral problems and agitation.       Objective:   Physical Exam   Constitutional: She is oriented to person, place, and time. She appears well-developed and well-nourished.  HENT:  Head: Normocephalic.  Right Ear: External ear normal.  Left Ear: External ear normal.  Eyes: Pupils are equal, round, and reactive to light.  Neck: Normal range of motion. No thyromegaly present.  Cardiovascular: Normal rate, regular rhythm, normal heart sounds and intact distal pulses.   No murmur heard. Pulmonary/Chest: Effort normal and breath sounds normal. No respiratory distress. She has no wheezes.  Abdominal: Soft. Bowel sounds are normal. She exhibits no distension and no mass. There is no tenderness.  Musculoskeletal: Normal range of motion. She exhibits no edema and no tenderness.  Lymphadenopathy:    She has no cervical adenopathy.  Neurological: She is alert and oriented to person, place, and time. She exhibits normal muscle tone.  Skin: Skin is warm and dry.  Psychiatric: She has a normal mood and affect. Her behavior is normal.          Assessment & Plan:  Need for prophylactic vaccination and inoculation against influenza  Wellness exam  Dm good control  htn good control  Recheck 6 months

## 2013-10-23 ENCOUNTER — Other Ambulatory Visit: Payer: Self-pay | Admitting: Family Medicine

## 2013-10-23 DIAGNOSIS — Z139 Encounter for screening, unspecified: Secondary | ICD-10-CM

## 2013-12-01 ENCOUNTER — Ambulatory Visit (HOSPITAL_COMMUNITY)
Admission: RE | Admit: 2013-12-01 | Discharge: 2013-12-01 | Disposition: A | Discharge status: Home / Self Care | Payer: Medicare Other | Source: Ambulatory Visit | Attending: Family Medicine | Admitting: Family Medicine

## 2013-12-01 DIAGNOSIS — Z139 Encounter for screening, unspecified: Secondary | ICD-10-CM

## 2013-12-01 DIAGNOSIS — Z1231 Encounter for screening mammogram for malignant neoplasm of breast: Secondary | ICD-10-CM | POA: Insufficient documentation

## 2013-12-23 ENCOUNTER — Encounter: Payer: Self-pay | Admitting: Family Medicine

## 2013-12-23 ENCOUNTER — Ambulatory Visit (INDEPENDENT_AMBULATORY_CARE_PROVIDER_SITE_OTHER): Payer: Medicare Other | Admitting: Family Medicine

## 2013-12-23 VITALS — BP 132/84 | HR 72 | Ht 60.0 in | Wt 104.0 lb

## 2013-12-23 DIAGNOSIS — R55 Syncope and collapse: Secondary | ICD-10-CM

## 2013-12-23 DIAGNOSIS — M2550 Pain in unspecified joint: Secondary | ICD-10-CM

## 2013-12-23 MED ORDER — MELOXICAM 15 MG PO TABS: 15.0000 mg | ORAL_TABLET | Freq: Every day | ORAL | Status: DC

## 2013-12-23 NOTE — Progress Notes (Signed)
Subjective:    Patient ID: Laura Melendez, female    DOB: 09-16-1941, 73 y.o.   MRN: [id number]  Hip Pain  The incident occurred more than 1 week ago. There was no injury mechanism. The pain is present in the right hip. The pain is at a severity of 8/10. The pain is moderate. The pain has been constant since onset. Associated symptoms include a loss of motion. Pertinent negatives include no muscle weakness, numbness or tingling. She reports no foreign bodies present. The symptoms are aggravated by movement. She has tried acetaminophen for the symptoms. The treatment provided mild relief.  Shoulder Pain  The pain is present in the right shoulder. This is a new problem. The current episode started 1 to 4 weeks ago. The problem has been unchanged. The pain is at a severity of 6/10. The pain is mild. Pertinent negatives include no fever, numbness or tingling. The symptoms are aggravated by activity. She has tried acetaminophen for the symptoms. The treatment provided mild relief.  Right arm and right hip pain. Started about 3 weeks ago. Taking tylenol.  Hip- worse with movement Patient also relates she has had passing out spells she states these go back to her childhood she relates anywhere between once or twice a year she passes out for no good reason she states recently she was in her bedroom felt lightheaded and suddenly found herself on the floor and found that her skin was sweaty. At that point in time she got up move around felt okay denied any chest pain and went about her weight she denies any seizure activity. She denies any angina but she is not overly physically active.  Past medical history family history review medicine list reviewed.   Review of Systems  Constitutional: Positive for activity change. Negative for fever, appetite change and fatigue.  HENT: Negative for congestion.   Respiratory: Negative for chest tightness.   Cardiovascular: Negative for chest pain.  Gastrointestinal:  Negative for abdominal pain.  Musculoskeletal: Positive for arthralgias and back pain.  Neurological: Negative for tingling and numbness.       Objective:   Physical Exam  Constitutional: She is oriented to person, place, and time. She appears well-developed and well-nourished.  HENT:  Head: Normocephalic.  Neck: Normal range of motion. No thyromegaly present.  Cardiovascular: Normal rate, regular rhythm, normal heart sounds and intact distal pulses.   No murmur heard. Pulmonary/Chest: Effort normal and breath sounds normal. No respiratory distress. She has no wheezes.  Abdominal: Soft. Bowel sounds are normal. She exhibits no distension and no mass. There is no tenderness.  Musculoskeletal: Normal range of motion. She exhibits no edema and no tenderness.  Lymphadenopathy:    She has no cervical adenopathy.  Neurological: She is alert and oriented to person, place, and time. She exhibits normal muscle tone.  Skin: Skin is warm and dry.  Psychiatric: She has a normal mood and affect. Her behavior is normal.   She does have some slight tenderness in the right shoulder with movement but no apparent weakness. Pulses are good strength is good. She also has some tenderness in the lower back region with negative straight leg raise.       Assessment & Plan:  Orthopedic arthralgias range of motion excised shown for the shoulder also gentle range of motion exercises for the hip I don't feel any x-rays indicated currently. If not improving over the next several weeks may need physical therapy or referral try anti-inflammatory it bothers  his stomach stop it  Syncope-no obvious orthostasis EKG overall looks good I believe this patient would benefit from a cardiology consultation is quite possible that no diagnosis will be found for this but I believe underlying arrhythmia needs to be ruled out.  Patient will followup within 3-4 months

## 2013-12-24 LAB — CBC WITH DIFFERENTIAL/PLATELET
BASOS PCT: 1 % (ref 0–1)
Basophils Absolute: 0.1 10*3/uL (ref 0.0–0.1)
EOS ABS: 0.1 10*3/uL (ref 0.0–0.7)
Eosinophils Relative: 1 % (ref 0–5)
HEMATOCRIT: 40.4 % (ref 36.0–46.0)
Hemoglobin: 13.7 g/dL (ref 12.0–15.0)
Lymphocytes Relative: 34 % (ref 12–46)
Lymphs Abs: 2.6 10*3/uL (ref 0.7–4.0)
MCH: 27.1 pg (ref 26.0–34.0)
MCHC: 33.9 g/dL (ref 30.0–36.0)
MCV: 80 fL (ref 78.0–100.0)
MONO ABS: 0.6 10*3/uL (ref 0.1–1.0)
Monocytes Relative: 8 % (ref 3–12)
NEUTROS ABS: 4.2 10*3/uL (ref 1.7–7.7)
NEUTROS PCT: 56 % (ref 43–77)
Platelets: 237 10*3/uL (ref 150–400)
RBC: 5.05 MIL/uL (ref 3.87–5.11)
RDW: 14.9 % (ref 11.5–15.5)
WBC: 7.6 10*3/uL (ref 4.0–10.5)

## 2013-12-24 LAB — BASIC METABOLIC PANEL
BUN: 12 mg/dL (ref 6–23)
CO2: 29 mEq/L (ref 19–32)
Calcium: 9.8 mg/dL (ref 8.4–10.5)
Chloride: 101 mEq/L (ref 96–112)
Creat: 0.57 mg/dL (ref 0.50–1.10)
Glucose, Bld: 140 mg/dL — ABNORMAL HIGH (ref 70–99)
POTASSIUM: 4.1 meq/L (ref 3.5–5.3)
Sodium: 139 mEq/L (ref 135–145)

## 2013-12-27 ENCOUNTER — Encounter: Payer: Self-pay | Admitting: Family Medicine

## 2013-12-28 ENCOUNTER — Ambulatory Visit: Payer: Medicare Other | Admitting: Cardiology

## 2014-01-01 ENCOUNTER — Encounter: Payer: Self-pay | Admitting: Cardiology

## 2014-01-01 ENCOUNTER — Ambulatory Visit (INDEPENDENT_AMBULATORY_CARE_PROVIDER_SITE_OTHER): Payer: Medicare Other | Admitting: Cardiology

## 2014-01-01 VITALS — BP 151/63 | HR 63 | Ht 60.0 in | Wt 102.0 lb

## 2014-01-01 DIAGNOSIS — R55 Syncope and collapse: Secondary | ICD-10-CM | POA: Insufficient documentation

## 2014-01-01 DIAGNOSIS — I10 Essential (primary) hypertension: Secondary | ICD-10-CM | POA: Insufficient documentation

## 2014-01-01 NOTE — Patient Instructions (Addendum)
Your physician wants you to follow-up in: ONE YEAR You will receive a reminder letter in the mail two months in advance. If you don't receive a letter, please call our office to schedule the follow-up appointment.  Your physician has requested that you have an echocardiogram. Echocardiography is a painless test that uses sound waves to create images of your heart. It provides your doctor with information about the size and shape of your heart and how well your heart's chambers and valves are working. This procedure takes approximately one hour. There are no restrictions for this procedure.  WE WILL CALL YOU WITH YOUR TEST RESULTS/INSTRUCTIONS/NEXT STEPS ONCE RECEIVED BY THE PROVIDER   

## 2014-01-01 NOTE — Progress Notes (Signed)
Clinical Summary Ms. Slagel is a 73 y.o.female referred for cardiology consultation by Dr.Luking. She has a history of syncope for many years. She tells me that she first noticed this when she was 73 years old. She has had one or 2 episodes each year, never any major injuries. She is not aware of any definite precipitant, does state that it only occurs when she is standing, most episodes have been when she is in the bathroom on the commode but not always. She has no other sense of chest pain, palpitations, or shortness of breath before it occurs, just feels "weird." After the events she feels nauseous and diaphoretic. Sometimes she is able to abort the episode by putting her head down or lying down, sometimes by using a cold washcloth.  ECG today shows normal sinus rhythm, decreased R wave progression.   No Known Allergies  Current Outpatient Prescriptions  Medication Sig Dispense Refill  . alendronate (FOSAMAX) 70 MG tablet Take 1 tablet (70 mg total) by mouth every 7 (seven) days.  12 tablet  3  . Calcium Carb-Cholecalciferol (CALCIUM 600/VITAMIN D3) 600-800 MG-UNIT TABS Take 1 tablet by mouth 2 (two) times daily with a meal.      . Cholecalciferol (VITAMIN D3) 400 UNITS CAPS Take 1 capsule by mouth 3 (three) times daily.      Marland Kitchen CINNAMON PO Take 1,000 mg by mouth daily.      Marland Kitchen CRANBERRY PO Take 84 mg by mouth daily.      Marland Kitchen lisinopril (PRINIVIL,ZESTRIL) 2.5 MG tablet Take 1 tablet (2.5 mg total) by mouth daily.  90 tablet  3  . metFORMIN (GLUCOPHAGE) 500 MG tablet Take 1 tablet (500 mg total) by mouth 2 (two) times daily with a meal.  180 tablet  3  . metoprolol (LOPRESSOR) 50 MG tablet Take 1 tablet (50 mg total) by mouth 2 (two) times daily.  180 tablet  3  . Multiple Vitamin (MULTIVITAMIN WITH MINERALS) TABS Take 1 tablet by mouth daily.      Marland Kitchen NIFEdipine (PROCARDIA XL/ADALAT-CC) 60 MG 24 hr tablet Take 1 tablet (60 mg total) by mouth daily.  90 tablet  3  . Omega-3 Fatty Acids (FISH OIL  CONCENTRATE) 300 MG CAPS Take 1 capsule by mouth daily.      . raloxifene (EVISTA) 60 MG tablet Take 1 tablet (60 mg total) by mouth daily.  90 tablet  3   No current facility-administered medications for this visit.    Past Medical History  Diagnosis Date  . Essential hypertension, benign   . Type 2 diabetes mellitus   . Osteoporosis 06/2008    Past Surgical History  Procedure Laterality Date  . Cholecystectomy    . Ankle surgery Left 2004  . Abdominal hysterectomy    . Anterior and posterior repair N/A 03/11/2013    Procedure: ANTERIOR (CYSTOCELE) AND POSTERIOR REPAIR (RECTOCELE);  Surgeon: Linda Hedges, DO;  Location: [facility];  Service: Gynecology;  Laterality: N/A;  with sacrospinous ligament fixation bilateral  . Colonoscopy  2009    negative    Family History  Problem Relation Age of Onset  . Cancer Father     Liver  . Cancer Sister     Breast  . Diabetes Brother     Social History Ms. Vanderweele reports that she has been smoking Cigarettes.  She has been smoking about 0.00 packs per day. She does not have any smokeless tobacco history on file. Ms. Mckinny reports that she does  not drink alcohol.  Review of Systems No exertional chest pain, NYHA class II dyspnea that is chronic. No orthopnea or PND. No leg edema.  Physical Examination Filed Vitals:   01/01/14 1020  BP: 151/63  Pulse: 63   Filed Weights   01/01/14 1020  Weight: 102 lb (46.267 kg)   Patient appears comfortable at rest. HEENT: Conjunctiva and lids normal, oropharynx clear. Neck: Supple, no elevated JVP or carotid bruits, no thyromegaly. Lungs: Clear to auscultation, nonlabored breathing at rest. Cardiac: Regular rate and rhythm, no S3 soft systolic murmur, no pericardial rub. Abdomen: Soft, nontender, bowel sounds present, no guarding or rebound. Extremities: No pitting edema, distal pulses 2+. Skin: Warm and dry. Musculoskeletal: No kyphosis. Neuropsychiatric: Alert and oriented x3, affect  grossly appropriate.   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 Longevity with recurrences would argue against a malignant cause, and would doubt arrhythmia at this point although not impossible. Baseline ECG reviewed. Would like to obtain an echocardiogram to rule out any obvious structural abnormalities that might require further workup. If this is not the case, would recommend observation for now given infrequent occurrences. If these escalate, I do think that cardiac monitoring would be a reasonable next step. Probably no strong indication for implantable loop recorder at this time. Followup arranged.  Essential hypertension, benign The patient is on combination therapy with ACE inhibitor, Lopressor, and nifedipine. Keep followup with Dr. Wolfgang Phoenix.    Satira Sark, M.D., F.A.C.C.

## 2014-01-01 NOTE — Assessment & Plan Note (Signed)
The patient is on combination therapy with ACE inhibitor, Lopressor, and nifedipine. Keep followup with Dr. Wolfgang Phoenix.

## 2014-01-01 NOTE — Assessment & Plan Note (Signed)
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 Longevity with recurrences would argue against a malignant cause, and would doubt arrhythmia at this point although not impossible. Baseline ECG reviewed. Would like to obtain an echocardiogram to rule out any obvious structural abnormalities that might require further workup. If this is not the case, would recommend observation for now given infrequent occurrences. If these escalate, I do think that cardiac monitoring would be a reasonable next step. Probably no strong indication for implantable loop recorder at this time. Followup arranged.

## 2014-01-04 ENCOUNTER — Ambulatory Visit (HOSPITAL_COMMUNITY)
Admission: RE | Admit: 2014-01-04 | Discharge: 2014-01-04 | Disposition: A | Discharge status: Home / Self Care | Payer: Medicare Other | Source: Ambulatory Visit | Attending: Cardiology | Admitting: Cardiology

## 2014-01-04 DIAGNOSIS — I517 Cardiomegaly: Secondary | ICD-10-CM

## 2014-01-04 DIAGNOSIS — E119 Type 2 diabetes mellitus without complications: Secondary | ICD-10-CM | POA: Insufficient documentation

## 2014-01-04 DIAGNOSIS — F17200 Nicotine dependence, unspecified, uncomplicated: Secondary | ICD-10-CM | POA: Insufficient documentation

## 2014-01-04 DIAGNOSIS — R55 Syncope and collapse: Secondary | ICD-10-CM | POA: Insufficient documentation

## 2014-01-04 DIAGNOSIS — I10 Essential (primary) hypertension: Secondary | ICD-10-CM | POA: Insufficient documentation

## 2014-01-04 NOTE — Progress Notes (Signed)
*  PRELIMINARY RESULTS* Echocardiogram 2D Echocardiogram has been performed.  Barnwell, [place] 01/04/2014, 3:17 PM

## 2014-01-19 ENCOUNTER — Encounter: Payer: Self-pay | Admitting: Family Medicine

## 2014-02-24 ENCOUNTER — Encounter: Payer: Self-pay | Admitting: Family Medicine

## 2014-02-24 ENCOUNTER — Ambulatory Visit (INDEPENDENT_AMBULATORY_CARE_PROVIDER_SITE_OTHER): Payer: Medicare Other | Admitting: Family Medicine

## 2014-02-24 VITALS — BP 144/82 | Ht 60.0 in | Wt 104.0 lb

## 2014-02-24 DIAGNOSIS — R300 Dysuria: Secondary | ICD-10-CM

## 2014-02-24 LAB — POCT URINALYSIS DIPSTICK
GLUCOSE UA: 50
Protein, UA: 30
Spec Grav, UA: 1.015
pH, UA: 5.5

## 2014-02-24 MED ORDER — CEFPROZIL 500 MG PO TABS: 500.0000 mg | ORAL_TABLET | Freq: Two times a day (BID) | ORAL | Status: DC

## 2014-02-24 NOTE — Progress Notes (Signed)
   Subjective:    Patient ID: Laura Melendez, female    DOB: 1941-11-05, 73 y.o.   MRN: [id number]  Dysuria  This is a new problem. The current episode started in the past 7 days. The problem occurs intermittently. The quality of the pain is described as burning. There has been no fever. Associated symptoms include flank pain, frequency and urgency. She has tried increased fluids for the symptoms. The treatment provided no relief.      Review of Systems  Genitourinary: Positive for dysuria, urgency, frequency and flank pain.       Objective:   Physical Exam  Lungs clear hearts regular flanks nontender abdomen soft UA with some wbc's      Assessment & Plan:  UTI antibiotics prescribed warning signs discussed followup if ongoing trouble

## 2014-03-23 ENCOUNTER — Encounter: Payer: Self-pay | Admitting: Family Medicine

## 2014-03-23 ENCOUNTER — Ambulatory Visit (INDEPENDENT_AMBULATORY_CARE_PROVIDER_SITE_OTHER): Payer: Medicare Other | Admitting: Family Medicine

## 2014-03-23 VITALS — BP 138/80 | Ht 60.0 in | Wt 103.0 lb

## 2014-03-23 DIAGNOSIS — E119 Type 2 diabetes mellitus without complications: Secondary | ICD-10-CM

## 2014-03-23 DIAGNOSIS — M810 Age-related osteoporosis without current pathological fracture: Secondary | ICD-10-CM | POA: Insufficient documentation

## 2014-03-23 DIAGNOSIS — E785 Hyperlipidemia, unspecified: Secondary | ICD-10-CM | POA: Insufficient documentation

## 2014-03-23 DIAGNOSIS — I10 Essential (primary) hypertension: Secondary | ICD-10-CM

## 2014-03-23 DIAGNOSIS — F17200 Nicotine dependence, unspecified, uncomplicated: Secondary | ICD-10-CM

## 2014-03-23 DIAGNOSIS — Z720 Tobacco use: Secondary | ICD-10-CM | POA: Insufficient documentation

## 2014-03-23 LAB — POCT GLYCOSYLATED HEMOGLOBIN (HGB A1C): HEMOGLOBIN A1C: 7.1

## 2014-03-23 NOTE — Progress Notes (Signed)
   Subjective:    Patient ID: Laura Melendez, female    DOB: [birth year], 73 y.o.   MRN: [id number]  Diabetes She presents for her follow-up diabetic visit. She has type 2 diabetes mellitus. There are no hypoglycemic associated symptoms. Pertinent negatives for hypoglycemia include no confusion. There are no diabetic associated symptoms. Pertinent negatives for diabetes include no chest pain, no fatigue, no polydipsia, no polyphagia and no weakness. Symptoms are stable. Current diabetic treatment includes oral agent (monotherapy). She is compliant with treatment all of the time. Frequency home blood tests: 3 x per month. Her overall blood glucose range is 110-130 mg/dl.   The patient was seen today as part of a comprehensive diabetic check up. The patient had the following elements completed: -Review of medication compliance -Review of glucose monitoring results -Review of any complications do to high or low sugars -Diabetic foot exam was completed as part of today's visit. The following was also discussed: -Importance of yearly eye exams -Importance of following diabetic/low sugar-starch diet -Importance of exercise and regular activity -Importance of regular followup visits. -Most recent hemoglobin A1c were reviewed with the patient along with goals regarding diabetes.    Review of Systems  Constitutional: Negative for activity change, appetite change and fatigue.  Respiratory: Negative for cough and chest tightness.   Cardiovascular: Negative for chest pain.  Endocrine: Negative for polydipsia and polyphagia.  Genitourinary: Negative for frequency.  Neurological: Negative for weakness.  Psychiatric/Behavioral: Negative for confusion.       Objective:   Physical Exam  Vitals reviewed. Constitutional: She appears well-nourished. No distress.  Cardiovascular: Normal rate, regular rhythm and normal heart sounds.   No murmur heard. Pulmonary/Chest: Effort normal and breath sounds  normal. No respiratory distress.  Musculoskeletal: She exhibits no edema.  Lymphadenopathy:    She has no cervical adenopathy.  Neurological: She is alert. She exhibits normal muscle tone.  Psychiatric: Her behavior is normal.          Assessment & Plan:  Diabetes good control encouraged dietary measures regular activity recheck A1c in approximately 4 months will need comprehensive lab work and a proximally 4 months as well.  Osteoporosis continue current medication. HTN good control continue current medication Patient counseled to only stay away from smoking

## 2014-07-14 ENCOUNTER — Other Ambulatory Visit: Payer: Self-pay | Admitting: *Deleted

## 2014-08-09 ENCOUNTER — Telehealth: Payer: Self-pay | Admitting: Family Medicine

## 2014-08-09 DIAGNOSIS — I10 Essential (primary) hypertension: Secondary | ICD-10-CM

## 2014-08-09 DIAGNOSIS — Z79899 Other long term (current) drug therapy: Secondary | ICD-10-CM

## 2014-08-09 DIAGNOSIS — M810 Age-related osteoporosis without current pathological fracture: Secondary | ICD-10-CM

## 2014-08-09 DIAGNOSIS — E119 Type 2 diabetes mellitus without complications: Secondary | ICD-10-CM

## 2014-08-09 NOTE — Telephone Encounter (Signed)
Urine Michael protein, hemoglobin P0L, metabolic 7, lipid, liver., Vitamin D

## 2014-08-09 NOTE — Telephone Encounter (Signed)
CBC, MET7 on 12/23/13

## 2014-08-09 NOTE — Telephone Encounter (Signed)
[facility] (bloodwork has been ordered in the system).

## 2014-08-09 NOTE — Telephone Encounter (Signed)
Patient needs order for blood work. °

## 2014-08-10 NOTE — Telephone Encounter (Signed)
Patient notified

## 2014-08-26 ENCOUNTER — Telehealth: Payer: Self-pay | Admitting: Family Medicine

## 2014-08-26 NOTE — Telephone Encounter (Signed)
Patient has appointment for physical 10/13 she wanting lab work done but just had some done 24th of august.

## 2014-08-27 NOTE — Telephone Encounter (Signed)
Please inform the patient that if she has orders in the system she needs to go get her lab work done including urine micro-protein

## 2014-08-27 NOTE — Telephone Encounter (Signed)
Pt's orders were put in on Aug 24th. Pt notified she can go over to [facility] to have blood drawn.

## 2014-09-07 LAB — BASIC METABOLIC PANEL WITH GFR
BUN: 16 mg/dL (ref 6–23)
CO2: 26 meq/L (ref 19–32)
Calcium: 10.2 mg/dL (ref 8.4–10.5)
Chloride: 103 meq/L (ref 96–112)
Creat: 0.66 mg/dL (ref 0.50–1.10)
Glucose, Bld: 125 mg/dL — ABNORMAL HIGH (ref 70–99)
Potassium: 4.1 meq/L (ref 3.5–5.3)
Sodium: 140 meq/L (ref 135–145)

## 2014-09-07 LAB — LIPID PANEL
CHOL/HDL RATIO: 3.2 ratio
Cholesterol: 178 mg/dL (ref 0–200)
HDL: 55 mg/dL (ref >39)
LDL Cholesterol: 86 mg/dL (ref 0–99)
Triglycerides: 185 mg/dL — ABNORMAL HIGH (ref <150)
VLDL: 37 mg/dL (ref 0–40)

## 2014-09-07 LAB — HEMOGLOBIN A1C
Hgb A1c MFr Bld: 7.5 % — ABNORMAL HIGH (ref <5.7)
Mean Plasma Glucose: 169 mg/dL — ABNORMAL HIGH (ref <117)

## 2014-09-07 LAB — MICROALBUMIN, URINE: Microalb, Ur: 1.42 mg/dL (ref 0.00–1.89)

## 2014-09-07 LAB — HEPATIC FUNCTION PANEL
ALT: 17 U/L (ref 0–35)
AST: 21 U/L (ref 0–37)
Albumin: 4.5 g/dL (ref 3.5–5.2)
Alkaline Phosphatase: 45 U/L (ref 39–117)
BILIRUBIN DIRECT: 0.1 mg/dL (ref 0.0–0.3)
BILIRUBIN TOTAL: 0.5 mg/dL (ref 0.2–1.2)
Indirect Bilirubin: 0.4 mg/dL (ref 0.2–1.2)
Total Protein: 7.2 g/dL (ref 6.0–8.3)

## 2014-09-07 LAB — VITAMIN D 25 HYDROXY (VIT D DEFICIENCY, FRACTURES): Vit D, 25-Hydroxy: 52 ng/mL (ref 30–89)

## 2014-09-28 ENCOUNTER — Ambulatory Visit (INDEPENDENT_AMBULATORY_CARE_PROVIDER_SITE_OTHER): Payer: Medicare Other | Admitting: Family Medicine

## 2014-09-28 ENCOUNTER — Encounter: Payer: Self-pay | Admitting: Family Medicine

## 2014-09-28 VITALS — BP 134/80 | Ht 60.0 in | Wt 102.0 lb

## 2014-09-28 DIAGNOSIS — Z0000 Encounter for general adult medical examination without abnormal findings: Secondary | ICD-10-CM

## 2014-09-28 DIAGNOSIS — E785 Hyperlipidemia, unspecified: Secondary | ICD-10-CM

## 2014-09-28 DIAGNOSIS — E119 Type 2 diabetes mellitus without complications: Secondary | ICD-10-CM

## 2014-09-28 DIAGNOSIS — Z23 Encounter for immunization: Secondary | ICD-10-CM

## 2014-09-28 DIAGNOSIS — I10 Essential (primary) hypertension: Secondary | ICD-10-CM

## 2014-09-28 NOTE — Progress Notes (Signed)
Subjective:    Patient ID: Laura Melendez, female    DOB: July 21, 1941, 73 y.o.   MRN: [id number]  HPI Patient here for annual exam & has no other concerns. AWV- Annual Wellness Visit  The patient was seen for their annual wellness visit. The patient's past medical history, surgical history, and family history were reviewed. Pertinent vaccines were reviewed ( tetanus, pneumonia, shingles, flu) The patient's medication list was reviewed and updated.  The height and weight were entered. The patient's current BMI is:19  Cognitive screening was completed. Outcome of Mini - Cog: passed  Falls within the past 6 months:none  Current tobacco usage: none (All patients who use tobacco were given written and verbal information on quitting)  Recent listing of emergency department/hospitalizations over the past year were reviewed.  current specialist the patient sees on a regular basis: none  The patient was seen today as part of a comprehensive diabetic check up. The patient had the following elements completed: -Review of medication compliance -Review of glucose monitoring results -Review of any complications do to high or low sugars -Diabetic foot exam was completed as part of today's visit. The following was also discussed: -Importance of yearly eye exams -Importance of following diabetic/low sugar-starch diet -Importance of exercise and regular activity -Importance of regular followup visits. -Most recent hemoglobin A1c were reviewed with the patient along with goals regarding diabetes.    Medicare annual wellness visit patient questionnaire was reviewed.  A written screening schedule for the patient for the next 5-10 years was given. Appropriate discussion of followup regarding next visit was discussed.      Review of Systems  Constitutional: Negative for activity change, appetite change and fatigue.  HENT: Negative for congestion, ear discharge and rhinorrhea.   Eyes:  Negative for discharge.  Respiratory: Negative for cough, chest tightness and wheezing.   Cardiovascular: Negative for chest pain.  Gastrointestinal: Negative for vomiting and abdominal pain.  Genitourinary: Negative for frequency and difficulty urinating.  Musculoskeletal: Negative for neck pain.  Allergic/Immunologic: Negative for environmental allergies and food allergies.  Neurological: Negative for weakness and headaches.  Psychiatric/Behavioral: Negative for behavioral problems and agitation.       Objective:   Physical Exam  Constitutional: She is oriented to person, place, and time. She appears well-developed and well-nourished.  HENT:  Head: Normocephalic.  Right Ear: External ear normal.  Left Ear: External ear normal.  Eyes: Pupils are equal, round, and reactive to light.  Neck: Normal range of motion. No thyromegaly present.  Cardiovascular: Normal rate, regular rhythm, normal heart sounds and intact distal pulses.   No murmur heard. Pulmonary/Chest: Effort normal and breath sounds normal. No respiratory distress. She has no wheezes.  Abdominal: Soft. Bowel sounds are normal. She exhibits no distension and no mass. There is no tenderness.  Musculoskeletal: Normal range of motion. She exhibits no edema and no tenderness.  Lymphadenopathy:    She has no cervical adenopathy.  Neurological: She is alert and oriented to person, place, and time. She exhibits normal muscle tone.  Skin: Skin is warm and dry.  Psychiatric: She has a normal mood and affect. Her behavior is normal.          Assessment & Plan:  Her overall exam is good. I reviewed over her diabetes is so poor control. She needs to do a better job with diet she does not want to be on additional medicines. She will watch diet recheck A1c in 3-4 months Safety dietary measures discussed.Patient  was also seen for other issues outside of wellness exam Blood pressure good.

## 2014-09-29 ENCOUNTER — Encounter: Payer: Medicare Other | Admitting: Family Medicine

## 2014-10-27 ENCOUNTER — Other Ambulatory Visit: Payer: Self-pay | Admitting: Family Medicine

## 2014-10-27 DIAGNOSIS — Z1231 Encounter for screening mammogram for malignant neoplasm of breast: Secondary | ICD-10-CM

## 2014-11-04 LAB — HM DIABETES EYE EXAM

## 2014-11-10 ENCOUNTER — Encounter: Payer: Self-pay | Admitting: Family Medicine

## 2014-12-03 ENCOUNTER — Ambulatory Visit (HOSPITAL_COMMUNITY)
Admission: RE | Admit: 2014-12-03 | Discharge: 2014-12-03 | Disposition: A | Discharge status: Home / Self Care | Payer: Medicare Other | Source: Ambulatory Visit | Attending: Family Medicine | Admitting: Family Medicine

## 2014-12-03 DIAGNOSIS — Z1231 Encounter for screening mammogram for malignant neoplasm of breast: Secondary | ICD-10-CM | POA: Diagnosis not present

## 2014-12-21 NOTE — Patient Instructions (Signed)
Laura Melendez  12/21/2014   Your procedure is scheduled on:  12/27/2014  Report to [facility] at 10:30 AM.  Call this number if you have problems the morning of surgery: [phone]   Remember:   Do not eat food or drink liquids after midnight.   Take these medicines the morning of surgery with A SIP OF WATER: Lisinopril, Metoprolol, Procardia, Evista   DO NOT TAKE DIABETIC MEDICATIONS AM OF PROCEDURE   Do not wear jewelry, make-up or nail polish.  Do not wear lotions, powders, or perfumes. You may wear deodorant.  Do not shave 48 hours prior to surgery. Men may shave face and neck.  Do not bring valuables to the hospital.  [facility] is not responsible for any belongings or valuables.               Contacts, dentures or bridgework may not be worn into surgery.  Leave suitcase in the car. After surgery it may be brought to your room.  For patients admitted to the hospital, discharge time is determined by your treatment team.               Patients discharged the day of surgery will not be allowed to drive home.  Name and phone number of your driver:   Special Instructions: N/A   Please read over the following fact sheets that you were given: Anesthesia Post-op Instructions   PATIENT INSTRUCTIONS POST-ANESTHESIA  IMMEDIATELY FOLLOWING SURGERY:  Do not drive or operate machinery for the first twenty four hours after surgery.  Do not make any important decisions for twenty four hours after surgery or while taking narcotic pain medications or sedatives.  If you develop intractable nausea and vomiting or a severe headache please notify your doctor immediately.  FOLLOW-UP:  Please make an appointment with your surgeon as instructed. You do not need to follow up with anesthesia unless specifically instructed to do so.  WOUND CARE INSTRUCTIONS (if applicable):  Keep a dry clean dressing on the anesthesia/puncture wound site if there is drainage.  Once the wound has quit draining you may  leave it open to air.  Generally you should leave the bandage intact for twenty four hours unless there is drainage.  If the epidural site drains for more than 36-48 hours please call the anesthesia department.  QUESTIONS?:  Please feel free to call your physician or the hospital operator if you have any questions, and they will be happy to assist you.      Cataract A cataract is a clouding of the lens of the eye. When a lens becomes cloudy, vision is reduced based on the degree and nature of the clouding. Many cataracts reduce vision to some degree. Some cataracts make people more near-sighted as they develop. Other cataracts increase glare. Cataracts that are ignored and become worse can sometimes look white. The white color can be seen through the pupil. CAUSES   Aging. However, cataracts may occur at any age, even in newborns.  Certain drugs.  Trauma to the eye.  Certain diseases such as diabetes.  Specific eye diseases such as chronic inflammation inside the eye or a sudden attack of a rare form of glaucoma.  Inherited or acquired medical problems. SYMPTOMS   Gradual, progressive drop in vision in the affected eye.  Severe, rapid visual loss. This most often happens when trauma is the cause. DIAGNOSIS  To detect a cataract, an eye doctor examines the lens. Cataracts are best diagnosed with an  exam of the eyes with the pupils enlarged (dilated) by drops.  TREATMENT  For an early cataract, vision may improve by using different eyeglasses or stronger lighting. If that does not help your vision, surgery is the only effective treatment. A cataract needs to be surgically removed when vision loss interferes with your everyday activities, such as driving, reading, or watching TV. A cataract may also have to be removed if it prevents examination or treatment of another eye problem. Surgery removes the cloudy lens and usually replaces it with a substitute lens (intraocular lens, IOL).  At a  time when both you and your doctor agree, the cataract will be surgically removed. If you have cataracts in both eyes, only one is usually removed at a time. This allows the operated eye to heal and be out of danger from any possible problems after surgery (such as infection or poor wound healing). In rare cases, a cataract may be doing damage to your eye. In these cases, your caregiver may advise surgical removal right away. The vast majority of people who have cataract surgery have better vision afterward. HOME CARE INSTRUCTIONS  If you are not planning surgery, you may be asked to do the following:  Use different eyeglasses.  Use stronger or brighter lighting.  Ask your eye doctor about reducing your medicine dose or changing medicines if it is thought that a medicine caused your cataract. Changing medicines does not make the cataract go away on its own.  Become familiar with your surroundings. Poor vision can lead to injury. Avoid bumping into things on the affected side. You are at a higher risk for tripping or falling.  Exercise extreme care when driving or operating machinery.  Wear sunglasses if you are sensitive to bright light or experiencing problems with glare. SEEK IMMEDIATE MEDICAL CARE IF:   You have a worsening or sudden vision loss.  You notice redness, swelling, or increasing pain in the eye.  You have a fever. Document Released: 12/03/2005 Document Revised: 02/25/2012 Document Reviewed: 07/27/2011 [facility] Patient Information 2015 [place], [place]. This information is not intended to replace advice given to you by your health care provider. Make sure you discuss any questions you have with your health care provider.

## 2014-12-22 ENCOUNTER — Encounter (HOSPITAL_COMMUNITY)
Admission: RE | Admit: 2014-12-22 | Discharge: 2014-12-22 | Disposition: A | Discharge status: Home / Self Care | Payer: Medicare HMO | Source: Ambulatory Visit | Attending: Ophthalmology | Admitting: Ophthalmology

## 2014-12-22 ENCOUNTER — Encounter (HOSPITAL_COMMUNITY): Payer: Self-pay

## 2014-12-22 ENCOUNTER — Other Ambulatory Visit: Payer: Self-pay

## 2014-12-22 DIAGNOSIS — R001 Bradycardia, unspecified: Secondary | ICD-10-CM | POA: Insufficient documentation

## 2014-12-22 DIAGNOSIS — R9431 Abnormal electrocardiogram [ECG] [EKG]: Secondary | ICD-10-CM | POA: Insufficient documentation

## 2014-12-22 DIAGNOSIS — Z01818 Encounter for other preprocedural examination: Secondary | ICD-10-CM | POA: Diagnosis present

## 2014-12-22 LAB — HEMOGLOBIN AND HEMATOCRIT, BLOOD
HCT: 40.8 % (ref 36.0–46.0)
Hemoglobin: 13 g/dL (ref 12.0–15.0)

## 2014-12-22 LAB — BASIC METABOLIC PANEL
Anion gap: 9 (ref 5–15)
BUN: 15 mg/dL (ref 6–23)
CHLORIDE: 106 meq/L (ref 96–112)
CO2: 25 mmol/L (ref 19–32)
CREATININE: 0.68 mg/dL (ref 0.50–1.10)
Calcium: 9.5 mg/dL (ref 8.4–10.5)
GFR calc non Af Amer: 85 mL/min — ABNORMAL LOW (ref >90)
Glucose, Bld: 143 mg/dL — ABNORMAL HIGH (ref 70–99)
Potassium: 4 mmol/L (ref 3.5–5.1)
Sodium: 140 mmol/L (ref 135–145)

## 2014-12-22 NOTE — Pre-Procedure Instructions (Signed)
Patient given information to sign up for my chart at home. 

## 2014-12-24 MED ORDER — CYCLOPENTOLATE-PHENYLEPHRINE OP SOLN OPTIME - NO CHARGE
OPHTHALMIC | Status: AC
  Filled 2014-12-24: qty 2

## 2014-12-24 MED ORDER — TETRACAINE HCL 0.5 % OP SOLN
OPHTHALMIC | Status: AC
  Filled 2014-12-24: qty 2

## 2014-12-24 MED ORDER — NEOMYCIN-POLYMYXIN-DEXAMETH 3.5-10000-0.1 OP SUSP
OPHTHALMIC | Status: AC
  Filled 2014-12-24: qty 5

## 2014-12-24 MED ORDER — PHENYLEPHRINE HCL 2.5 % OP SOLN
OPHTHALMIC | Status: AC
  Filled 2014-12-24: qty 15

## 2014-12-27 ENCOUNTER — Ambulatory Visit (HOSPITAL_COMMUNITY): Payer: Medicare HMO | Admitting: Anesthesiology

## 2014-12-27 ENCOUNTER — Encounter (HOSPITAL_COMMUNITY)
Admission: RE | Disposition: A | Discharge status: Home / Self Care | Payer: Self-pay | Source: Ambulatory Visit | Attending: Ophthalmology

## 2014-12-27 ENCOUNTER — Ambulatory Visit (HOSPITAL_COMMUNITY)
Admission: RE | Admit: 2014-12-27 | Discharge: 2014-12-27 | Disposition: A | Discharge status: Home / Self Care | Payer: Medicare HMO | Source: Ambulatory Visit | Attending: Ophthalmology | Admitting: Ophthalmology

## 2014-12-27 ENCOUNTER — Encounter (HOSPITAL_COMMUNITY): Payer: Self-pay | Admitting: Anesthesiology

## 2014-12-27 DIAGNOSIS — H2511 Age-related nuclear cataract, right eye: Secondary | ICD-10-CM | POA: Diagnosis not present

## 2014-12-27 HISTORY — PX: CATARACT EXTRACTION W/PHACO: SHX586

## 2014-12-27 LAB — GLUCOSE, CAPILLARY: GLUCOSE-CAPILLARY: 118 mg/dL — AB (ref 70–99)

## 2014-12-27 SURGERY — PHACOEMULSIFICATION, CATARACT, WITH IOL INSERTION
Anesthesia: Monitor Anesthesia Care | Site: Eye | Laterality: Right

## 2014-12-27 MED ORDER — MIDAZOLAM HCL 2 MG/2ML IJ SOLN
INTRAMUSCULAR | Status: AC
  Filled 2014-12-27: qty 2

## 2014-12-27 MED ORDER — FENTANYL CITRATE 0.05 MG/ML IJ SOLN
INTRAMUSCULAR | Status: AC
  Filled 2014-12-27: qty 2

## 2014-12-27 MED ORDER — ONDANSETRON HCL 4 MG/2ML IJ SOLN: 4.0000 mg | Freq: Once | INTRAMUSCULAR | Status: DC | PRN

## 2014-12-27 MED ORDER — CYCLOPENTOLATE-PHENYLEPHRINE 0.2-1 % OP SOLN
1.0000 [drp] | OPHTHALMIC | Status: AC
  Administered 2014-12-27 (×3): 1 [drp] via OPHTHALMIC

## 2014-12-27 MED ORDER — LACTATED RINGERS IV SOLN
INTRAVENOUS | Status: DC
  Administered 2014-12-27: 11:00:00 via INTRAVENOUS

## 2014-12-27 MED ORDER — NEOMYCIN-POLYMYXIN-DEXAMETH 3.5-10000-0.1 OP SUSP
OPHTHALMIC | Status: DC | PRN
  Administered 2014-12-27: 2 [drp] via OPHTHALMIC

## 2014-12-27 MED ORDER — POVIDONE-IODINE 5 % OP SOLN
OPHTHALMIC | Status: DC | PRN
  Administered 2014-12-27: 1 via OPHTHALMIC

## 2014-12-27 MED ORDER — EPINEPHRINE HCL 1 MG/ML IJ SOLN
INTRAMUSCULAR | Status: AC
  Filled 2014-12-27: qty 1

## 2014-12-27 MED ORDER — TETRACAINE HCL 0.5 % OP SOLN
1.0000 [drp] | OPHTHALMIC | Status: AC
  Administered 2014-12-27 (×3): 1 [drp] via OPHTHALMIC

## 2014-12-27 MED ORDER — NA HYALUR & NA CHOND-NA HYALUR 0.55-0.5 ML IO KIT
PACK | INTRAOCULAR | Status: DC | PRN
  Administered 2014-12-27: 1 via OPHTHALMIC

## 2014-12-27 MED ORDER — KETOROLAC TROMETHAMINE 0.5 % OP SOLN: 1.0000 [drp] | OPHTHALMIC | Status: AC

## 2014-12-27 MED ORDER — MIDAZOLAM HCL 2 MG/2ML IJ SOLN
1.0000 mg | INTRAMUSCULAR | Status: DC | PRN
  Administered 2014-12-27: 2 mg via INTRAVENOUS

## 2014-12-27 MED ORDER — EPINEPHRINE HCL 1 MG/ML IJ SOLN
INTRAOCULAR | Status: DC | PRN
  Administered 2014-12-27: 12:00:00

## 2014-12-27 MED ORDER — PHENYLEPHRINE HCL 2.5 % OP SOLN
1.0000 [drp] | OPHTHALMIC | Status: AC
  Administered 2014-12-27 (×3): 1 [drp] via OPHTHALMIC

## 2014-12-27 MED ORDER — FENTANYL CITRATE 0.05 MG/ML IJ SOLN: 25.0000 ug | INTRAMUSCULAR | Status: DC | PRN

## 2014-12-27 MED ORDER — LIDOCAINE HCL (PF) 1 % IJ SOLN
INTRAMUSCULAR | Status: DC | PRN
  Administered 2014-12-27: .5 mL

## 2014-12-27 MED ORDER — LIDOCAINE HCL 3.5 % OP GEL
1.0000 "application " | Freq: Once | OPHTHALMIC | Status: AC
  Administered 2014-12-27: 1 via OPHTHALMIC

## 2014-12-27 MED ORDER — FENTANYL CITRATE 0.05 MG/ML IJ SOLN
25.0000 ug | INTRAMUSCULAR | Status: AC
  Administered 2014-12-27 (×2): 25 ug via INTRAVENOUS

## 2014-12-27 MED ORDER — BSS IO SOLN
INTRAOCULAR | Status: DC | PRN
  Administered 2014-12-27: 15 mL via INTRAOCULAR

## 2014-12-27 SURGICAL SUPPLY — 34 items
CAPSULAR TENSION RING-AMO (OPHTHALMIC RELATED) IMPLANT
CLOTH BEACON ORANGE TIMEOUT ST (SAFETY) ×3 IMPLANT
EYE SHIELD UNIVERSAL CLEAR (GAUZE/BANDAGES/DRESSINGS) ×3 IMPLANT
GLOVE BIO SURGEON STRL SZ 6.5 (GLOVE) IMPLANT
GLOVE BIO SURGEONS STRL SZ 6.5 (GLOVE)
GLOVE BIOGEL PI IND STRL 6.5 (GLOVE) ×1 IMPLANT
GLOVE BIOGEL PI IND STRL 7.0 (GLOVE) IMPLANT
GLOVE BIOGEL PI IND STRL 7.5 (GLOVE) IMPLANT
GLOVE BIOGEL PI INDICATOR 6.5 (GLOVE) ×2
GLOVE BIOGEL PI INDICATOR 7.0 (GLOVE)
GLOVE BIOGEL PI INDICATOR 7.5 (GLOVE)
GLOVE ECLIPSE 6.5 STRL STRAW (GLOVE) IMPLANT
GLOVE ECLIPSE 7.0 STRL STRAW (GLOVE) IMPLANT
GLOVE ECLIPSE 7.5 STRL STRAW (GLOVE) IMPLANT
GLOVE EXAM NITRILE LRG STRL (GLOVE) ×3 IMPLANT
GLOVE EXAM NITRILE MD LF STRL (GLOVE) IMPLANT
GLOVE SKINSENSE NS SZ6.5 (GLOVE)
GLOVE SKINSENSE NS SZ7.0 (GLOVE)
GLOVE SKINSENSE STRL SZ6.5 (GLOVE) IMPLANT
GLOVE SKINSENSE STRL SZ7.0 (GLOVE) IMPLANT
KIT VITRECTOMY (OPHTHALMIC RELATED) IMPLANT
PAD ARMBOARD 7.5X6 YLW CONV (MISCELLANEOUS) ×3 IMPLANT
PROC W NO LENS (INTRAOCULAR LENS)
PROC W SPEC LENS (INTRAOCULAR LENS)
PROCESS W NO LENS (INTRAOCULAR LENS) IMPLANT
PROCESS W SPEC LENS (INTRAOCULAR LENS) IMPLANT
RETRACTOR IRIS SIGHTPATH (OPHTHALMIC RELATED) IMPLANT
RING MALYGIN (MISCELLANEOUS) IMPLANT
SIGHTPATH CAT PROC W REG LENS (Ophthalmic Related) ×3 IMPLANT
SYRINGE LUER LOK 1CC (MISCELLANEOUS) ×3 IMPLANT
TAPE SURG TRANSPARENT 2IN (GAUZE/BANDAGES/DRESSINGS) ×1 IMPLANT
TAPE TRANSPARENT 2IN (GAUZE/BANDAGES/DRESSINGS) ×2
VISCOELASTIC ADDITIONAL (OPHTHALMIC RELATED) IMPLANT
WATER STERILE IRR 250ML POUR (IV SOLUTION) ×3 IMPLANT

## 2014-12-27 NOTE — Op Note (Signed)
Date of Admission: 12/27/2014  Date of Surgery: 12/27/2014   Pre-Op Dx: Cataract Right Eye  Post-Op Dx: Senile Nuclear Cataract Right  Eye,  Dx Code H25.11  Surgeon: Tonny Branch, M.D.  Assistants: None  Anesthesia: Topical with MAC  Indications: Painless, progressive loss of vision with compromise of daily activities.  Surgery: Cataract Extraction with Intraocular lens Implant Right Eye  Discription: The patient had dilating drops and viscous lidocaine placed into the Right eye in the pre-op holding area. After transfer to the operating room, a time out was performed. The patient was then prepped and draped. Beginning with a 78 degree blade a paracentesis port was made at the surgeon's 2 o'clock position. The anterior chamber was then filled with 1% non-preserved lidocaine. This was followed by filling the anterior chamber with Provisc.  A 2.71mm keratome blade was used to make a clear corneal incision at the temporal limbus.  A bent cystatome needle was used to create a continuous tear capsulotomy. Hydrodissection was performed with balanced salt solution on a Fine canula. The lens nucleus was then removed using the phacoemulsification handpiece. Residual cortex was removed with the I&A handpiece. The anterior chamber and capsular bag were refilled with Provisc. A posterior chamber intraocular lens was placed into the capsular bag with it's injector. The implant was positioned with the Kuglan hook. The Provisc was then removed from the anterior chamber and capsular bag with the I&A handpiece. Stromal hydration of the main incision and paracentesis port was performed with BSS on a Fine canula. The wounds were tested for leak which was negative. The patient tolerated the procedure well. There were no operative complications. The patient was then transferred to the recovery room in stable condition.  Complications: None  Specimen: None  EBL: None  Prosthetic device: Hoya iSert 250, power 24.0 D,  SN [id number].

## 2014-12-27 NOTE — Transfer of Care (Signed)
Immediate Anesthesia Transfer of Care Note  Patient: Laura Melendez  Procedure(s) Performed: Procedure(s) with comments: CATARACT EXTRACTION PHACO AND INTRAOCULAR LENS PLACEMENT RIGHT EYE (Right) - CDE:10.63  Patient Location: Short Stay  Anesthesia Type:MAC  Level of Consciousness: awake  Airway & Oxygen Therapy: Patient Spontanous Breathing  Post-op Assessment: Report given to PACU RN  Post vital signs: Reviewed  Complications: No apparent anesthesia complications

## 2014-12-27 NOTE — Anesthesia Postprocedure Evaluation (Signed)
  Anesthesia Post-op Note  Patient: Laura Melendez  Procedure(s) Performed: Procedure(s) with comments: CATARACT EXTRACTION PHACO AND INTRAOCULAR LENS PLACEMENT RIGHT EYE (Right) - CDE:10.63  Patient Location: Short Stay  Anesthesia Type:MAC  Level of Consciousness: awake, alert  and oriented  Airway and Oxygen Therapy: Patient Spontanous Breathing  Post-op Pain: none  Post-op Assessment: Post-op Vital signs reviewed, Patient's Cardiovascular Status Stable, Respiratory Function Stable, Patent Airway and No signs of Nausea or vomiting  Post-op Vital Signs: Reviewed and stable  Last Vitals:  Filed Vitals:   12/27/14 1100  BP: 107/57  Pulse:   Temp:   Resp: 15    Complications: No apparent anesthesia complications

## 2014-12-27 NOTE — Discharge Instructions (Signed)

## 2014-12-27 NOTE — Anesthesia Preprocedure Evaluation (Signed)
Anesthesia Evaluation  Patient identified by MRN, date of birth, ID band Patient awake    Reviewed: Allergy & Precautions, H&P , NPO status , Patient's Chart, lab work & pertinent test results  Airway Mallampati: II  TM Distance: >3 FB Neck ROM: Full    Dental no notable dental hx. (+) Upper Dentures, Partial Lower   Pulmonary neg pulmonary ROS, Current Smoker, former smoker,  breath sounds clear to auscultation  Pulmonary exam normal       Cardiovascular hypertension, Pt. on medications and Pt. on home beta blockers Rhythm:Regular Rate:Bradycardia     Neuro/Psych negative neurological ROS  negative psych ROS   GI/Hepatic negative GI ROS, Neg liver ROS,   Endo/Other  diabetes, Well Controlled, Type 2, Oral Hypoglycemic Agents  Renal/GU negative Renal ROS   Vaginal Prolapse Cystocele Rectocele    Musculoskeletal negative musculoskeletal ROS (+)   Abdominal   Peds  Hematology negative hematology ROS (+)   Anesthesia Other Findings   Reproductive/Obstetrics negative OB ROS                             Anesthesia Physical Anesthesia Plan  ASA: II  Anesthesia Plan: MAC   Post-op Pain Management:    Induction: Intravenous  Airway Management Planned: Nasal Cannula  Additional Equipment:   Intra-op Plan:   Post-operative Plan:   Informed Consent: I have reviewed the patients History and Physical, chart, labs and discussed the procedure including the risks, benefits and alternatives for the proposed anesthesia with the patient or authorized representative who has indicated his/her understanding and acceptance.     Plan Discussed with:   Anesthesia Plan Comments:         Anesthesia Quick Evaluation

## 2014-12-27 NOTE — H&P (Signed)
I have reviewed the H&P, the patient was re-examined, and I have identified no interval changes in medical condition and plan of care since the history and physical of record  

## 2014-12-28 ENCOUNTER — Encounter (HOSPITAL_COMMUNITY): Payer: Self-pay | Admitting: Ophthalmology

## 2015-01-05 ENCOUNTER — Encounter (HOSPITAL_COMMUNITY)
Admission: RE | Admit: 2015-01-05 | Discharge: 2015-01-05 | Disposition: A | Discharge status: Home / Self Care | Payer: Medicare HMO | Source: Ambulatory Visit | Attending: Ophthalmology | Admitting: Ophthalmology

## 2015-01-10 ENCOUNTER — Encounter (HOSPITAL_COMMUNITY): Payer: Self-pay | Admitting: *Deleted

## 2015-01-12 MED ORDER — PHENYLEPHRINE HCL 2.5 % OP SOLN
OPHTHALMIC | Status: AC
  Filled 2015-01-12: qty 15

## 2015-01-12 MED ORDER — CYCLOPENTOLATE-PHENYLEPHRINE OP SOLN OPTIME - NO CHARGE
OPHTHALMIC | Status: AC
  Filled 2015-01-12: qty 2

## 2015-01-12 MED ORDER — LIDOCAINE HCL 3.5 % OP GEL
OPHTHALMIC | Status: AC
  Filled 2015-01-12: qty 1

## 2015-01-12 MED ORDER — LIDOCAINE HCL (PF) 1 % IJ SOLN
INTRAMUSCULAR | Status: AC
  Filled 2015-01-12: qty 2

## 2015-01-12 MED ORDER — NEOMYCIN-POLYMYXIN-DEXAMETH 3.5-10000-0.1 OP SUSP
OPHTHALMIC | Status: AC
Start: 2015-01-12 — End: 2015-01-12
  Filled 2015-01-12: qty 5

## 2015-01-12 MED ORDER — TETRACAINE HCL 0.5 % OP SOLN
OPHTHALMIC | Status: AC
  Filled 2015-01-12: qty 2

## 2015-01-13 ENCOUNTER — Encounter (HOSPITAL_COMMUNITY)
Admission: RE | Disposition: A | Discharge status: Home / Self Care | Payer: Self-pay | Source: Ambulatory Visit | Attending: Ophthalmology

## 2015-01-13 ENCOUNTER — Ambulatory Visit (HOSPITAL_COMMUNITY): Payer: Medicare HMO | Admitting: Anesthesiology

## 2015-01-13 ENCOUNTER — Encounter (HOSPITAL_COMMUNITY): Payer: Self-pay | Admitting: *Deleted

## 2015-01-13 ENCOUNTER — Ambulatory Visit (HOSPITAL_COMMUNITY)
Admission: RE | Admit: 2015-01-13 | Discharge: 2015-01-13 | Disposition: A | Discharge status: Home / Self Care | Payer: Medicare HMO | Source: Ambulatory Visit | Attending: Ophthalmology | Admitting: Ophthalmology

## 2015-01-13 DIAGNOSIS — H2512 Age-related nuclear cataract, left eye: Secondary | ICD-10-CM | POA: Diagnosis not present

## 2015-01-13 HISTORY — PX: CATARACT EXTRACTION W/PHACO: SHX586

## 2015-01-13 LAB — GLUCOSE, CAPILLARY: GLUCOSE-CAPILLARY: 116 mg/dL — AB (ref 70–99)

## 2015-01-13 SURGERY — PHACOEMULSIFICATION, CATARACT, WITH IOL INSERTION
Anesthesia: Monitor Anesthesia Care | Site: Eye | Laterality: Left

## 2015-01-13 MED ORDER — FENTANYL CITRATE 0.05 MG/ML IJ SOLN
25.0000 ug | INTRAMUSCULAR | Status: AC
  Administered 2015-01-13 (×2): 25 ug via INTRAVENOUS

## 2015-01-13 MED ORDER — PHENYLEPHRINE HCL 2.5 % OP SOLN
1.0000 [drp] | OPHTHALMIC | Status: AC | PRN
  Administered 2015-01-13 (×3): 1 [drp] via OPHTHALMIC

## 2015-01-13 MED ORDER — LIDOCAINE HCL 3.5 % OP GEL
1.0000 "application " | Freq: Once | OPHTHALMIC | Status: AC
  Administered 2015-01-13: 1 via OPHTHALMIC

## 2015-01-13 MED ORDER — PROVISC 10 MG/ML IO SOLN
INTRAOCULAR | Status: DC | PRN
  Administered 2015-01-13: 0.85 mL via INTRAOCULAR

## 2015-01-13 MED ORDER — FENTANYL CITRATE 0.05 MG/ML IJ SOLN
INTRAMUSCULAR | Status: AC
  Filled 2015-01-13: qty 2

## 2015-01-13 MED ORDER — NEOMYCIN-POLYMYXIN-DEXAMETH 3.5-10000-0.1 OP SUSP
OPHTHALMIC | Status: DC | PRN
  Administered 2015-01-13: 1 [drp] via OPHTHALMIC

## 2015-01-13 MED ORDER — LACTATED RINGERS IV SOLN
INTRAVENOUS | Status: DC
  Administered 2015-01-13: 1000 mL via INTRAVENOUS

## 2015-01-13 MED ORDER — MIDAZOLAM HCL 2 MG/2ML IJ SOLN
1.0000 mg | INTRAMUSCULAR | Status: DC | PRN
  Administered 2015-01-13: 2 mg via INTRAVENOUS

## 2015-01-13 MED ORDER — POVIDONE-IODINE 5 % OP SOLN
OPHTHALMIC | Status: DC | PRN
  Administered 2015-01-13: 1 via OPHTHALMIC

## 2015-01-13 MED ORDER — BSS IO SOLN
INTRAOCULAR | Status: DC | PRN
  Administered 2015-01-13: 15 mL via INTRAOCULAR

## 2015-01-13 MED ORDER — LIDOCAINE HCL (PF) 1 % IJ SOLN
INTRAMUSCULAR | Status: DC | PRN
  Administered 2015-01-13: .5 mL

## 2015-01-13 MED ORDER — EPINEPHRINE HCL 1 MG/ML IJ SOLN
INTRAMUSCULAR | Status: AC
  Filled 2015-01-13: qty 1

## 2015-01-13 MED ORDER — LIDOCAINE 3.5 % OP GEL OPTIME - NO CHARGE
OPHTHALMIC | Status: DC | PRN
  Administered 2015-01-13: 1 [drp] via OPHTHALMIC

## 2015-01-13 MED ORDER — EPINEPHRINE HCL 1 MG/ML IJ SOLN
INTRAMUSCULAR | Status: DC | PRN
  Administered 2015-01-13: 10:00:00

## 2015-01-13 MED ORDER — MIDAZOLAM HCL 2 MG/2ML IJ SOLN
INTRAMUSCULAR | Status: AC
  Filled 2015-01-13: qty 2

## 2015-01-13 MED ORDER — TETRACAINE HCL 0.5 % OP SOLN
1.0000 [drp] | OPHTHALMIC | Status: AC | PRN
  Administered 2015-01-13 (×3): 1 [drp] via OPHTHALMIC

## 2015-01-13 MED ORDER — CYCLOPENTOLATE-PHENYLEPHRINE 0.2-1 % OP SOLN
1.0000 [drp] | OPHTHALMIC | Status: AC
  Administered 2015-01-13 (×3): 1 [drp] via OPHTHALMIC

## 2015-01-13 SURGICAL SUPPLY — 11 items
CLOTH BEACON ORANGE TIMEOUT ST (SAFETY) ×3 IMPLANT
EYE SHIELD UNIVERSAL CLEAR (GAUZE/BANDAGES/DRESSINGS) ×3 IMPLANT
GLOVE BIOGEL PI IND STRL 7.0 (GLOVE) ×1 IMPLANT
GLOVE BIOGEL PI INDICATOR 7.0 (GLOVE) ×2
GLOVE EXAM NITRILE LRG STRL (GLOVE) ×3 IMPLANT
PAD ARMBOARD 7.5X6 YLW CONV (MISCELLANEOUS) ×3 IMPLANT
SIGHTPATH CAT PROC W REG LENS (Ophthalmic Related) ×3 IMPLANT
SYRINGE LUER LOK 1CC (MISCELLANEOUS) ×3 IMPLANT
TAPE SURG TRANSPORE 1 IN (GAUZE/BANDAGES/DRESSINGS) ×1 IMPLANT
TAPE SURGICAL TRANSPORE 1 IN (GAUZE/BANDAGES/DRESSINGS) ×2
WATER STERILE IRR 250ML POUR (IV SOLUTION) ×3 IMPLANT

## 2015-01-13 NOTE — Anesthesia Postprocedure Evaluation (Signed)
  Anesthesia Post-op Note  Patient: Laura Melendez  Procedure(s) Performed: Procedure(s) with comments: CATARACT EXTRACTION PHACO AND INTRAOCULAR LENS PLACEMENT LEFT EYE (Left) - CDE:18.87  Patient Location: Short Stay  Anesthesia Type:MAC  Level of Consciousness: awake, alert  and oriented  Airway and Oxygen Therapy: Patient Spontanous Breathing  Post-op Pain: none  Post-op Assessment: Post-op Vital signs reviewed  Post-op Vital Signs: Reviewed and stable  Last Vitals:  Filed Vitals:   01/13/15 1010  BP: 128/55  Temp:   Resp: 11    Complications: No apparent anesthesia complications

## 2015-01-13 NOTE — Op Note (Signed)
Date of Admission: 01/13/2015  Date of Surgery: 01/13/2015   Pre-Op Dx: Cataract Left Eye  Post-Op Dx: Senile Nuclear Cataract Left  Eye,  Dx Code H25.12  Surgeon: Tonny Branch, M.D.  Assistants: None  Anesthesia: Topical with MAC  Indications: Painless, progressive loss of vision with compromise of daily activities.  Surgery: Cataract Extraction with Intraocular lens Implant Left Eye  Discription: The patient had dilating drops and viscous lidocaine placed into the Left eye in the pre-op holding area. After transfer to the operating room, a time out was performed. The patient was then prepped and draped. Beginning with a 43 degree blade a paracentesis port was made at the surgeon's 2 o'clock position. The anterior chamber was then filled with 1% non-preserved lidocaine. This was followed by filling the anterior chamber with Provisc.  A 2.10mm keratome blade was used to make a clear corneal incision at the temporal limbus.  A bent cystatome needle was used to create a continuous tear capsulotomy. Hydrodissection was performed with balanced salt solution on a Fine canula. The lens nucleus was then removed using the phacoemulsification handpiece. Residual cortex was removed with the I&A handpiece. The anterior chamber and capsular bag were refilled with Provisc. A posterior chamber intraocular lens was placed into the capsular bag with it's injector. The implant was positioned with the Kuglan hook. The Provisc was then removed from the anterior chamber and capsular bag with the I&A handpiece. Stromal hydration of the main incision and paracentesis port was performed with BSS on a Fine canula. The wounds were tested for leak which was negative. The patient tolerated the procedure well. There were no operative complications. The patient was then transferred to the recovery room in stable condition.  Complications: None  Specimen: None  EBL: None  Prosthetic device: Hoya iSert 250, power 24.0 D, SN  [id number].

## 2015-01-13 NOTE — H&P (Signed)
I have reviewed the H&P, the patient was re-examined, and I have identified no interval changes in medical condition and plan of care since the history and physical of record  

## 2015-01-13 NOTE — Transfer of Care (Signed)
Immediate Anesthesia Transfer of Care Note  Patient: Laura Melendez  Procedure(s) Performed: Procedure(s) with comments: CATARACT EXTRACTION PHACO AND INTRAOCULAR LENS PLACEMENT LEFT EYE (Left) - CDE:18.87  Patient Location: Short Stay  Anesthesia Type:MAC  Level of Consciousness: awake  Airway & Oxygen Therapy: Patient Spontanous Breathing  Post-op Assessment: Report given to PACU RN  Post vital signs: Reviewed  Last Vitals:  Filed Vitals:   01/13/15 1010  BP: 128/55  Temp:   Resp: 11    Complications: No apparent anesthesia complications

## 2015-01-13 NOTE — Anesthesia Preprocedure Evaluation (Signed)
Anesthesia Evaluation  Patient identified by MRN, date of birth, ID band Patient awake    Reviewed: Allergy & Precautions, H&P , NPO status , Patient's Chart, lab work & pertinent test results  Airway Mallampati: II  TM Distance: >3 FB Neck ROM: Full    Dental no notable dental hx. (+) Upper Dentures, Partial Lower   Pulmonary neg pulmonary ROS, Current Smoker, former smoker,  breath sounds clear to auscultation  Pulmonary exam normal       Cardiovascular hypertension, Pt. on medications and Pt. on home beta blockers Rhythm:Regular Rate:Bradycardia     Neuro/Psych negative neurological ROS  negative psych ROS   GI/Hepatic negative GI ROS, Neg liver ROS,   Endo/Other  diabetes, Well Controlled, Type 2, Oral Hypoglycemic Agents  Renal/GU negative Renal ROS   Vaginal Prolapse Cystocele Rectocele    Musculoskeletal negative musculoskeletal ROS (+)   Abdominal   Peds  Hematology negative hematology ROS (+)   Anesthesia Other Findings   Reproductive/Obstetrics negative OB ROS                             Anesthesia Physical Anesthesia Plan  ASA: II  Anesthesia Plan: MAC   Post-op Pain Management:    Induction: Intravenous  Airway Management Planned: Nasal Cannula  Additional Equipment:   Intra-op Plan:   Post-operative Plan:   Informed Consent: I have reviewed the patients History and Physical, chart, labs and discussed the procedure including the risks, benefits and alternatives for the proposed anesthesia with the patient or authorized representative who has indicated his/her understanding and acceptance.     Plan Discussed with:   Anesthesia Plan Comments:         Anesthesia Quick Evaluation

## 2015-01-13 NOTE — Discharge Instructions (Signed)

## 2015-01-14 ENCOUNTER — Encounter (HOSPITAL_COMMUNITY): Payer: Self-pay | Admitting: Ophthalmology

## 2015-01-28 ENCOUNTER — Inpatient Hospital Stay (HOSPITAL_COMMUNITY)
Admission: EM | Admit: 2015-01-28 | Discharge: 2015-02-01 | DRG: 871 | Disposition: A | Discharge status: Home / Self Care | Payer: Medicare HMO | Attending: Internal Medicine | Admitting: Internal Medicine

## 2015-01-28 ENCOUNTER — Emergency Department (HOSPITAL_COMMUNITY): Payer: Medicare HMO

## 2015-01-28 ENCOUNTER — Encounter (HOSPITAL_COMMUNITY): Payer: Self-pay | Admitting: *Deleted

## 2015-01-28 DIAGNOSIS — Z79899 Other long term (current) drug therapy: Secondary | ICD-10-CM

## 2015-01-28 DIAGNOSIS — Z7983 Long term (current) use of bisphosphonates: Secondary | ICD-10-CM

## 2015-01-28 DIAGNOSIS — Z8673 Personal history of transient ischemic attack (TIA), and cerebral infarction without residual deficits: Secondary | ICD-10-CM

## 2015-01-28 DIAGNOSIS — A419 Sepsis, unspecified organism: Secondary | ICD-10-CM | POA: Diagnosis not present

## 2015-01-28 DIAGNOSIS — J189 Pneumonia, unspecified organism: Secondary | ICD-10-CM | POA: Diagnosis not present

## 2015-01-28 DIAGNOSIS — Z720 Tobacco use: Secondary | ICD-10-CM | POA: Diagnosis present

## 2015-01-28 DIAGNOSIS — Z90710 Acquired absence of both cervix and uterus: Secondary | ICD-10-CM

## 2015-01-28 DIAGNOSIS — M810 Age-related osteoporosis without current pathological fracture: Secondary | ICD-10-CM | POA: Diagnosis present

## 2015-01-28 DIAGNOSIS — Z681 Body mass index (BMI) 19 or less, adult: Secondary | ICD-10-CM

## 2015-01-28 DIAGNOSIS — E785 Hyperlipidemia, unspecified: Secondary | ICD-10-CM | POA: Diagnosis present

## 2015-01-28 DIAGNOSIS — R509 Fever, unspecified: Secondary | ICD-10-CM

## 2015-01-28 DIAGNOSIS — F17210 Nicotine dependence, cigarettes, uncomplicated: Secondary | ICD-10-CM | POA: Diagnosis present

## 2015-01-28 DIAGNOSIS — Z9841 Cataract extraction status, right eye: Secondary | ICD-10-CM

## 2015-01-28 DIAGNOSIS — E119 Type 2 diabetes mellitus without complications: Secondary | ICD-10-CM

## 2015-01-28 DIAGNOSIS — G9340 Encephalopathy, unspecified: Secondary | ICD-10-CM | POA: Diagnosis present

## 2015-01-28 DIAGNOSIS — I10 Essential (primary) hypertension: Secondary | ICD-10-CM | POA: Diagnosis present

## 2015-01-28 DIAGNOSIS — Z961 Presence of intraocular lens: Secondary | ICD-10-CM | POA: Diagnosis present

## 2015-01-28 DIAGNOSIS — R109 Unspecified abdominal pain: Secondary | ICD-10-CM

## 2015-01-28 LAB — COMPREHENSIVE METABOLIC PANEL
ALT: 17 U/L (ref 0–35)
AST: 24 U/L (ref 0–37)
Albumin: 4.1 g/dL (ref 3.5–5.2)
Alkaline Phosphatase: 44 U/L (ref 39–117)
Anion gap: 10 (ref 5–15)
BUN: 14 mg/dL (ref 6–23)
CALCIUM: 9.8 mg/dL (ref 8.4–10.5)
CHLORIDE: 102 mmol/L (ref 96–112)
CO2: 25 mmol/L (ref 19–32)
CREATININE: 0.72 mg/dL (ref 0.50–1.10)
GFR, EST NON AFRICAN AMERICAN: 83 mL/min — AB (ref >90)
Glucose, Bld: 141 mg/dL — ABNORMAL HIGH (ref 70–99)
Potassium: 3.8 mmol/L (ref 3.5–5.1)
Sodium: 137 mmol/L (ref 135–145)
Total Bilirubin: 0.9 mg/dL (ref 0.3–1.2)
Total Protein: 7.1 g/dL (ref 6.0–8.3)

## 2015-01-28 LAB — URINE MICROSCOPIC-ADD ON

## 2015-01-28 LAB — URINALYSIS, ROUTINE W REFLEX MICROSCOPIC
Bilirubin Urine: NEGATIVE
Glucose, UA: NEGATIVE mg/dL
HGB URINE DIPSTICK: NEGATIVE
Ketones, ur: 15 mg/dL — AB
Nitrite: NEGATIVE
PROTEIN: NEGATIVE mg/dL
Specific Gravity, Urine: 1.026 (ref 1.005–1.030)
UROBILINOGEN UA: 0.2 mg/dL (ref 0.0–1.0)
pH: 8 (ref 5.0–8.0)

## 2015-01-28 LAB — CBC WITH DIFFERENTIAL/PLATELET
BASOS ABS: 0 10*3/uL (ref 0.0–0.1)
BASOS PCT: 0 % (ref 0–1)
EOS PCT: 0 % (ref 0–5)
Eosinophils Absolute: 0 10*3/uL (ref 0.0–0.7)
HCT: 38.9 % (ref 36.0–46.0)
Hemoglobin: 12.8 g/dL (ref 12.0–15.0)
LYMPHS PCT: 11 % — AB (ref 12–46)
Lymphs Abs: 1.3 10*3/uL (ref 0.7–4.0)
MCH: 27.1 pg (ref 26.0–34.0)
MCHC: 32.9 g/dL (ref 30.0–36.0)
MCV: 82.4 fL (ref 78.0–100.0)
Monocytes Absolute: 0.6 10*3/uL (ref 0.1–1.0)
Monocytes Relative: 5 % (ref 3–12)
Neutro Abs: 10.4 10*3/uL — ABNORMAL HIGH (ref 1.7–7.7)
Neutrophils Relative %: 84 % — ABNORMAL HIGH (ref 43–77)
Platelets: 192 10*3/uL (ref 150–400)
RBC: 4.72 MIL/uL (ref 3.87–5.11)
RDW: 14.6 % (ref 11.5–15.5)
WBC: 12.4 10*3/uL — ABNORMAL HIGH (ref 4.0–10.5)

## 2015-01-28 LAB — CBG MONITORING, ED: GLUCOSE-CAPILLARY: 135 mg/dL — AB (ref 70–99)

## 2015-01-28 MED ORDER — ACETAMINOPHEN 325 MG PO TABS
ORAL_TABLET | ORAL | Status: AC
  Filled 2015-01-28: qty 2

## 2015-01-28 MED ORDER — ACETAMINOPHEN 325 MG PO TABS
650.0000 mg | ORAL_TABLET | Freq: Once | ORAL | Status: AC
  Administered 2015-01-28: 650 mg via ORAL

## 2015-01-28 NOTE — ED Notes (Signed)
The pt was ok yesterday but  Today she has been confused.  At present she knows her name but not her address does not know the day of the week month or year.  She knows the president.  Nom pain anywhere.  Temp now

## 2015-01-28 NOTE — ED Notes (Signed)
Recent cataract surgery

## 2015-01-28 NOTE — ED Notes (Signed)
CBG at triage is 135.

## 2015-01-29 ENCOUNTER — Emergency Department (HOSPITAL_COMMUNITY): Payer: Medicare HMO

## 2015-01-29 ENCOUNTER — Inpatient Hospital Stay (HOSPITAL_COMMUNITY): Payer: Medicare HMO

## 2015-01-29 DIAGNOSIS — E118 Type 2 diabetes mellitus with unspecified complications: Secondary | ICD-10-CM

## 2015-01-29 DIAGNOSIS — J189 Pneumonia, unspecified organism: Secondary | ICD-10-CM | POA: Diagnosis present

## 2015-01-29 DIAGNOSIS — Z681 Body mass index (BMI) 19 or less, adult: Secondary | ICD-10-CM | POA: Diagnosis not present

## 2015-01-29 DIAGNOSIS — E119 Type 2 diabetes mellitus without complications: Secondary | ICD-10-CM | POA: Diagnosis present

## 2015-01-29 DIAGNOSIS — Z9841 Cataract extraction status, right eye: Secondary | ICD-10-CM | POA: Diagnosis not present

## 2015-01-29 DIAGNOSIS — Z90710 Acquired absence of both cervix and uterus: Secondary | ICD-10-CM | POA: Diagnosis not present

## 2015-01-29 DIAGNOSIS — F17210 Nicotine dependence, cigarettes, uncomplicated: Secondary | ICD-10-CM | POA: Diagnosis present

## 2015-01-29 DIAGNOSIS — Z7983 Long term (current) use of bisphosphonates: Secondary | ICD-10-CM | POA: Diagnosis not present

## 2015-01-29 DIAGNOSIS — Z79899 Other long term (current) drug therapy: Secondary | ICD-10-CM | POA: Diagnosis not present

## 2015-01-29 DIAGNOSIS — I10 Essential (primary) hypertension: Secondary | ICD-10-CM | POA: Diagnosis present

## 2015-01-29 DIAGNOSIS — E785 Hyperlipidemia, unspecified: Secondary | ICD-10-CM | POA: Diagnosis present

## 2015-01-29 DIAGNOSIS — G9340 Encephalopathy, unspecified: Secondary | ICD-10-CM | POA: Diagnosis present

## 2015-01-29 DIAGNOSIS — A419 Sepsis, unspecified organism: Secondary | ICD-10-CM | POA: Diagnosis present

## 2015-01-29 DIAGNOSIS — Z8673 Personal history of transient ischemic attack (TIA), and cerebral infarction without residual deficits: Secondary | ICD-10-CM | POA: Diagnosis not present

## 2015-01-29 DIAGNOSIS — M810 Age-related osteoporosis without current pathological fracture: Secondary | ICD-10-CM | POA: Diagnosis present

## 2015-01-29 DIAGNOSIS — R509 Fever, unspecified: Secondary | ICD-10-CM

## 2015-01-29 DIAGNOSIS — Z961 Presence of intraocular lens: Secondary | ICD-10-CM | POA: Diagnosis present

## 2015-01-29 DIAGNOSIS — Z720 Tobacco use: Secondary | ICD-10-CM

## 2015-01-29 LAB — GLUCOSE, CAPILLARY: Glucose-Capillary: 155 mg/dL — ABNORMAL HIGH (ref 70–99)

## 2015-01-29 LAB — CSF CELL COUNT WITH DIFFERENTIAL
EOS CSF: 0 % (ref 0–1)
Eosinophils, CSF: 0 % (ref 0–1)
Lymphs, CSF: 0 % — ABNORMAL LOW (ref 40–80)
Lymphs, CSF: 0 % — ABNORMAL LOW (ref 40–80)
MONOCYTE-MACROPHAGE-SPINAL FLUID: 1 % — AB (ref 15–45)
Monocyte-Macrophage-Spinal Fluid: 0 % — ABNORMAL LOW (ref 15–45)
OTHER CELLS CSF: 0
Other Cells, CSF: 0
RBC COUNT CSF: 1 /mm3 — AB
RBC Count, CSF: 0 /mm3
SEGMENTED NEUTROPHILS-CSF: 0 % (ref 0–6)
SEGMENTED NEUTROPHILS-CSF: 0 % (ref 0–6)
TUBE #: 1
TUBE #: 4
WBC, CSF: 0 /mm3 (ref 0–5)
WBC, CSF: 1 /mm3 (ref 0–5)

## 2015-01-29 LAB — GRAM STAIN: Special Requests: NORMAL

## 2015-01-29 LAB — LACTIC ACID, PLASMA
LACTIC ACID, VENOUS: 1.4 mmol/L (ref 0.5–2.0)
Lactic Acid, Venous: 2.9 mmol/L (ref 0.5–2.0)

## 2015-01-29 LAB — GLUCOSE, CSF: Glucose, CSF: 83 mg/dL — ABNORMAL HIGH (ref 43–76)

## 2015-01-29 LAB — PROTEIN, CSF: Total  Protein, CSF: 21 mg/dL (ref 15–45)

## 2015-01-29 MED ORDER — IOHEXOL 300 MG/ML  SOLN
25.0000 mL | INTRAMUSCULAR | Status: AC
  Administered 2015-01-29 (×2): 25 mL via ORAL

## 2015-01-29 MED ORDER — DEXTROSE 5 % IV SOLN
1.0000 g | Freq: Once | INTRAVENOUS | Status: AC
  Administered 2015-01-29: 1 g via INTRAVENOUS
  Filled 2015-01-29: qty 1

## 2015-01-29 MED ORDER — ONDANSETRON HCL 4 MG PO TABS: 4.0000 mg | ORAL_TABLET | Freq: Four times a day (QID) | ORAL | Status: DC | PRN

## 2015-01-29 MED ORDER — DIFLUPREDNATE 0.05 % OP EMUL
1.0000 [drp] | Freq: Three times a day (TID) | OPHTHALMIC | Status: DC
  Administered 2015-01-29 – 2015-01-31 (×8): 1 [drp] via OPHTHALMIC

## 2015-01-29 MED ORDER — ONDANSETRON HCL 4 MG/2ML IJ SOLN: 4.0000 mg | Freq: Four times a day (QID) | INTRAMUSCULAR | Status: DC | PRN

## 2015-01-29 MED ORDER — OXYCODONE HCL 5 MG PO TABS: 5.0000 mg | ORAL_TABLET | ORAL | Status: DC | PRN

## 2015-01-29 MED ORDER — DEXTROSE 5 % IV SOLN
1.0000 g | INTRAVENOUS | Status: DC
  Administered 2015-01-30 – 2015-02-01 (×3): 1 g via INTRAVENOUS
  Filled 2015-01-29 (×3): qty 1

## 2015-01-29 MED ORDER — SODIUM CHLORIDE 0.9 % IV SOLN
1500.0000 mg | Freq: Once | INTRAVENOUS | Status: AC
  Administered 2015-01-29: 1500 mg via INTRAVENOUS
  Filled 2015-01-29: qty 1500

## 2015-01-29 MED ORDER — HYDROMORPHONE HCL 1 MG/ML IJ SOLN: 0.5000 mg | INTRAMUSCULAR | Status: DC | PRN

## 2015-01-29 MED ORDER — VANCOMYCIN HCL IN DEXTROSE 750-5 MG/150ML-% IV SOLN
750.0000 mg | INTRAVENOUS | Status: DC
  Administered 2015-01-30 – 2015-01-31 (×2): 750 mg via INTRAVENOUS
  Filled 2015-01-29 (×3): qty 150

## 2015-01-29 MED ORDER — ALUM & MAG HYDROXIDE-SIMETH 200-200-20 MG/5ML PO SUSP: 30.0000 mL | Freq: Four times a day (QID) | ORAL | Status: DC | PRN

## 2015-01-29 MED ORDER — ENOXAPARIN SODIUM 40 MG/0.4ML ~~LOC~~ SOLN
40.0000 mg | SUBCUTANEOUS | Status: DC
  Administered 2015-01-29 – 2015-02-01 (×4): 40 mg via SUBCUTANEOUS
  Filled 2015-01-29 (×6): qty 0.4

## 2015-01-29 MED ORDER — DEXTROSE 5 % IV SOLN
1.0000 g | Freq: Two times a day (BID) | INTRAVENOUS | Status: DC
Start: 2015-01-29 — End: 2015-01-29

## 2015-01-29 MED ORDER — SODIUM CHLORIDE 0.9 % IV SOLN
INTRAVENOUS | Status: DC
  Administered 2015-01-29 (×2): via INTRAVENOUS

## 2015-01-29 MED ORDER — BROMFENAC SODIUM 0.07 % OP SOLN
1.0000 [drp] | Freq: Every day | OPHTHALMIC | Status: DC
  Administered 2015-01-29 – 2015-01-31 (×3): 1 [drp] via OPHTHALMIC
  Filled 2015-01-29 (×3): qty 1.6

## 2015-01-29 MED ORDER — ALBUTEROL SULFATE (2.5 MG/3ML) 0.083% IN NEBU
2.5000 mg | INHALATION_SOLUTION | Freq: Four times a day (QID) | RESPIRATORY_TRACT | Status: DC
Start: 2015-01-29 — End: 2015-01-29
  Administered 2015-01-29 (×3): 2.5 mg via RESPIRATORY_TRACT
  Filled 2015-01-29 (×3): qty 3

## 2015-01-29 MED ORDER — SODIUM CHLORIDE 0.9 % IV SOLN
Freq: Once | INTRAVENOUS | Status: AC
  Administered 2015-01-29: via INTRAVENOUS

## 2015-01-29 MED ORDER — ALBUTEROL SULFATE (2.5 MG/3ML) 0.083% IN NEBU: 2.5000 mg | INHALATION_SOLUTION | Freq: Four times a day (QID) | RESPIRATORY_TRACT | Status: DC | PRN

## 2015-01-29 NOTE — Progress Notes (Signed)
New Admission Note:   Arrival Method: Via Stretcher Mental Orientation: Alert and oriented x 4 Telemetry: N/A Assessment:  Skin: Intact IV:  Pain: Denies Tubes: None Safety Measures: Safety Fall Prevention Plan has been discussed  Admission: to be completed [address] [place] Orientation: Patient has been orientated to the room, unit and staff.  Family:   Orders have been reviewed. Will continue to monitor the patient. Call light has been placed within reach and bed alarm has been activated.   Mady Gemma, RN-BC Phone: [phone]

## 2015-01-29 NOTE — Consult Note (Addendum)
ANTIBIOTIC CONSULT NOTE - INITIAL  Pharmacy Consult for Vancomycin Indication: rule out sepsis  No Known Allergies  Patient Measurements: Height: 5' 1.02" (155 cm) Weight: 100 lb 15.5 oz (45.8 kg) IBW/kg (Calculated) : 47.86  Vital Signs: Temp: 103.1 F (39.5 C) (02/12 2221) Temp Source: Oral (02/12 2221) BP: 133/52 mmHg (02/13 0430) Pulse Rate: 81 (02/13 0430) Intake/Output from previous day: 02/12 0701 - 02/13 0700 In: -  Out: 100 [Urine:100] Intake/Output from this shift: Total I/O In: -  Out: 100 [Urine:100]  Labs:  Recent Labs  01/28/15 2229  WBC 12.4*  HGB 12.8  PLT 192  CREATININE 0.72   Estimated Creatinine Clearance: 45.3 mL/min (by C-G formula based on Cr of 0.72).   Microbiology: Recent Results (from the past 720 hour(s))  Gram stain     Status: None   Collection Time: 01/29/15  1:25 AM  Result Value Ref Range Status   Specimen Description CSF  Final   Special Requests Normal  Final   Gram Stain CYTOSPUN NO ORGANISMS SEEN NO WBC SEEN   Final   Report Status 01/29/2015 FINAL  Final    Medical History: Past Medical History  Diagnosis Date  . Essential hypertension, benign   . Type 2 diabetes mellitus   . Osteoporosis 06/2008   Assessment: 73yof presents to the ED with AMS and a fever. She will begin empiric vancomycin and cefepime for possible sepsis. Renal function wnl.  Vancomycin 1.5g and Cefepime 1g to be given in the ED.  Goal of Therapy:  Vancomycin trough level 15-20 mcg/ml  Plan:  1) Vancomycin 750mg  IV q24 2) Change cefepime to 1g IV q24 3) Follow renal function, cultures, LOT, level if needed  Deboraha Sprang 01/29/2015,5:41 AM

## 2015-01-29 NOTE — ED Provider Notes (Signed)
CSN: [id number]     Arrival date & time 01/28/15  2217 History   None    This chart was scribed for Everlene Balls, MD by Laura Melendez, ED Scribe. This patient was seen in room B16C/B16C and the patient's care was started 12:17 AM.   Chief Complaint  Patient presents with  . Altered Mental Status   The history is provided by the patient. No language interpreter was used.    HPI Comments: Laura Melendez here with her husband and son is a 75 y.o. female with a PMHx of DM and osteoporosis who presents to the Emergency Department complaining of altered mental status onset 5 PM this evening. Son states pt has been sluggish, unsteady on her feet, disoriented, stating "i just don't feel well", and confused. Last known well yesterday. Husband states she sustained an unwitnessed fall yesterday after slipping on the porch. [name] admits to hitting her head but denies any LOC. No new rashes. She denies any fever, chills, vomiting, diarrhea, dysuria, HA, or diarrhea. No history of diverticulitis. Pt recently had cataract surgery outpatient 1/28 and went for follow up 2/11. No known allegies to medications.  Past Medical History  Diagnosis Date  . Essential hypertension, benign   . Type 2 diabetes mellitus   . Osteoporosis 06/2008   Past Surgical History  Procedure Laterality Date  . Cholecystectomy    . Ankle surgery Left 2004    otif  . Abdominal hysterectomy    . Anterior and posterior repair N/A 03/11/2013    Procedure: ANTERIOR (CYSTOCELE) AND POSTERIOR REPAIR (RECTOCELE);  Surgeon: Linda Hedges, DO;  Location: [facility];  Service: Gynecology;  Laterality: N/A;  with sacrospinous ligament fixation bilateral  . Colonoscopy  2009    negative  . Cataract extraction w/phaco Right 12/27/2014    Procedure: CATARACT EXTRACTION PHACO AND INTRAOCULAR LENS PLACEMENT RIGHT EYE;  Surgeon: Tonny Branch, MD;  Location: [facility];  Service: Ophthalmology;  Laterality: Right;  CDE:10.63  . Cataract extraction  w/phaco Left 01/13/2015    Procedure: CATARACT EXTRACTION PHACO AND INTRAOCULAR LENS PLACEMENT LEFT EYE;  Surgeon: Tonny Branch, MD;  Location: [facility];  Service: Ophthalmology;  Laterality: Left;  CDE:18.87   Family History  Problem Relation Age of Onset  . Cancer Father     Liver  . Cancer Sister     Breast  . Diabetes Brother    History  Substance Use Topics  . Smoking status: Light Tobacco Smoker -- 0.25 packs/day for 30 years    Types: Cigarettes  . Smokeless tobacco: Not on file  . Alcohol Use: No   OB History    No data available     Review of Systems  Constitutional: Negative for fever and chills.  Gastrointestinal: Negative for nausea, vomiting, abdominal pain and diarrhea.  Musculoskeletal: Positive for neck pain.  Skin: Negative for rash.  Neurological: Negative for headaches.  Psychiatric/Behavioral: Positive for confusion.  All other systems reviewed and are negative.     Allergies  Review of patient's allergies indicates no known allergies.  Home Medications   Prior to Admission medications   Medication Sig Start Date End Date Taking? Authorizing Provider  alendronate (FOSAMAX) 70 MG tablet Take 1 tablet (70 mg total) by mouth every 7 (seven) days. 09/24/13   [name], MD  Calcium Carb-Cholecalciferol (CALCIUM 600/VITAMIN D3) 600-800 MG-UNIT TABS Take 1 tablet by mouth 2 (two) times daily with a meal.    Historical Provider, MD  Cholecalciferol (VITAMIN D3)  400 UNITS CAPS Take 1 capsule by mouth 3 (three) times daily.    Historical Provider, MD  CINNAMON PO Take 1,000 mg by mouth daily.    Historical Provider, MD  CRANBERRY PO Take 84 mg by mouth daily.    Historical Provider, MD  lisinopril (PRINIVIL,ZESTRIL) 2.5 MG tablet Take 1 tablet (2.5 mg total) by mouth daily. 09/24/13   [name], MD  metFORMIN (GLUCOPHAGE) 500 MG tablet Take 1 tablet (500 mg total) by mouth 2 (two) times daily with a meal. 09/24/13   [name], MD  metoprolol  (LOPRESSOR) 50 MG tablet Take 1 tablet (50 mg total) by mouth 2 (two) times daily. 09/24/13   [name], MD  Multiple Vitamin (MULTIVITAMIN WITH MINERALS) TABS Take 1 tablet by mouth daily.    Historical Provider, MD  naproxen sodium (ANAPROX) 220 MG tablet Take 220 mg by mouth daily as needed (pain).    Historical Provider, MD  NIFEdipine (PROCARDIA XL/ADALAT-CC) 60 MG 24 hr tablet Take 1 tablet (60 mg total) by mouth daily. 09/24/13   [name], MD  Omega-3 Fatty Acids (FISH OIL CONCENTRATE) 300 MG CAPS Take 1 capsule by mouth daily.    Historical Provider, MD  raloxifene (EVISTA) 60 MG tablet Take 1 tablet (60 mg total) by mouth daily. 09/24/13   [name], MD   Triage Vitals: BP 139/58 mmHg  Pulse 80  Temp(Src) 103.1 F (39.5 C) (Oral)  Resp 24  SpO2 93%   Physical Exam  Constitutional: She is oriented to person, place, and time. She appears well-developed and well-nourished. No distress.  HENT:  Head: Normocephalic and atraumatic.  Nose: Nose normal.  Mouth/Throat: Oropharynx is clear and moist. No oropharyngeal exudate.  Eyes: Conjunctivae and EOM are normal. Pupils are equal, round, and reactive to light. No scleral icterus.  Neck: Normal range of motion. Neck supple. No JVD present. No tracheal deviation present. No thyromegaly present.  Cardiovascular: Normal rate, regular rhythm and normal heart sounds.  Exam reveals no gallop and no friction rub.   No murmur heard. Pulmonary/Chest: Effort normal and breath sounds normal. No respiratory distress. She has no wheezes. She exhibits no tenderness.  Abdominal: Soft. Bowel sounds are normal. She exhibits no distension and no mass. There is no tenderness. There is no rebound and no guarding.  Musculoskeletal: Normal range of motion. She exhibits no edema or tenderness.  Lymphadenopathy:    She has no cervical adenopathy.  Neurological: She is alert and oriented to person, place, and time. No cranial nerve deficit. She  exhibits normal muscle tone.  Skin: Skin is warm and dry. No rash noted. No erythema. No pallor.  Nursing note and vitals reviewed.   ED Course  LUMBAR PUNCTURE Date/Time: 01/29/2015 4:57 AM Performed by: Everlene Balls Authorized by: Everlene Balls Consent: Verbal consent obtained. Written consent obtained. Risks and benefits: risks, benefits and alternatives were discussed Consent given by: patient Patient understanding: patient states understanding of the procedure being performed Patient consent: the patient's understanding of the procedure matches consent given Procedure consent: procedure consent matches procedure scheduled Relevant documents: relevant documents present and verified Test results: test results available and properly labeled Site marked: the operative site was marked Imaging studies: imaging studies available Patient identity confirmed: arm band, verbally with patient and hospital-assigned identification number Time out: Immediately prior to procedure a "time out" was called to verify the correct patient, procedure, equipment, support staff and site/side marked as required. Indications: evaluation for  infection and evaluation for altered mental status Anesthesia: local infiltration Local anesthetic: lidocaine 1% with epinephrine Anesthetic total: 5 ml Patient sedated: no Preparation: Patient was prepped and draped in the usual sterile fashion. Lumbar space: L3-L4 interspace Patient's position: left lateral decubitus Needle gauge: 20 Needle type: spinal needle - Quincke tip Needle length: 2.5 in Number of attempts: 1 Opening pressure: 12 cm H2O Fluid appearance: clear Tubes of fluid: 4 Total volume: 4 ml Post-procedure: site cleaned, pressure dressing applied and adhesive bandage applied Patient tolerance: Patient tolerated the procedure well with no immediate complications   (including critical care time)  DIAGNOSTIC STUDIES: Oxygen Saturation is 93% on RA,  adequate by my interpretation.    COORDINATION OF CARE: 12:36 AM- Will order blood work, CT head without contrast, urinalysis. Will give fluids, Tylenol, and DG chest 2 view. Discussed treatment plan with pt at bedside and pt agreed to plan.  a   Labs Review Labs Reviewed  CBC WITH DIFFERENTIAL/PLATELET - Abnormal; Notable for the following:    WBC 12.4 (*)    Neutrophils Relative % 84 (*)    Neutro Abs 10.4 (*)    Lymphocytes Relative 11 (*)    All other components within normal limits  COMPREHENSIVE METABOLIC PANEL - Abnormal; Notable for the following:    Glucose, Bld 141 (*)    GFR calc non Af Amer 83 (*)    All other components within normal limits  URINALYSIS, ROUTINE W REFLEX MICROSCOPIC - Abnormal; Notable for the following:    Ketones, ur 15 (*)    Leukocytes, UA MODERATE (*)    All other components within normal limits  URINE MICROSCOPIC-ADD ON - Abnormal; Notable for the following:    Squamous Epithelial / LPF FEW (*)    All other components within normal limits  CSF CELL COUNT WITH DIFFERENTIAL - Abnormal; Notable for the following:    Appearance, CSF CLEAR (*)    RBC Count, CSF 1 (*)    Lymphs, CSF 0 (*)    Monocyte-Macrophage-Spinal Fluid 0 (*)    All other components within normal limits  CSF CELL COUNT WITH DIFFERENTIAL - Abnormal; Notable for the following:    Appearance, CSF CLEAR (*)    Lymphs, CSF 0 (*)    Monocyte-Macrophage-Spinal Fluid 1 (*)    All other components within normal limits  GLUCOSE, CSF - Abnormal; Notable for the following:    Glucose, CSF 83 (*)    All other components within normal limits  CBG MONITORING, ED - Abnormal; Notable for the following:    Glucose-Capillary 135 (*)    All other components within normal limits  GRAM STAIN  URINE CULTURE  CSF CULTURE  HERPES SIMPLEX VIRUS CULTURE  CULTURE, BLOOD (ROUTINE X 2)  CULTURE, BLOOD (ROUTINE X 2)  PROTEIN, CSF  HERPES SIMPLEX VIRUS(HSV) DNA BY PCR  LACTIC ACID, PLASMA  LACTIC  ACID, PLASMA    Imaging Review Dg Chest 2 View  01/28/2015   CLINICAL DATA:  Acute onset of confusion.  Initial encounter.  EXAM: CHEST  2 VIEW  COMPARISON:  Chest radiograph performed 01/15/2010  FINDINGS: The lungs are well-aerated. Minimal left basilar opacity may reflect atelectasis or scarring. There is no evidence of pleural effusion or pneumothorax.  The heart is borderline normal in size. No acute osseous abnormalities are seen. Scattered calcification is seen along the descending thoracic aorta and proximal abdominal aorta.  IMPRESSION: Minimal left basilar opacity again may reflect atelectasis or scarring. Lungs otherwise clear.  Electronically Signed   By: Garald Balding M.D.   On: 01/28/2015 23:18   Ct Head Wo Contrast  01/29/2015   CLINICAL DATA:  Confusion beginning today, dizziness and syncope yesterday. History of hypertension diabetes.  EXAM: CT HEAD WITHOUT CONTRAST  TECHNIQUE: Contiguous axial images were obtained from the base of the skull through the vertex without intravenous contrast.  COMPARISON:  CT of the head June 16, 2005  FINDINGS: The ventricles and sulci are normal for age. No intraparenchymal hemorrhage, mass effect nor midline shift. Confluent supratentorial white matter hypodensities are within normal range for patient's age and though non-specific suggest sequelae of chronic small vessel ischemic disease. No acute large vascular territory infarcts. Chronic appearing RIGHT thalamic infarcts are new from prior study. Remote small LEFT cerebellar infarct.  No abnormal extra-axial fluid collections. Basal cisterns are patent. Moderate calcific atherosclerosis of the carotid siphons.  No skull fracture. The included ocular globes and orbital contents are non-suspicious. Status post bilateral ocular lens implants. Frothy secretions RIGHT posterior ethmoid air cell without air-fluid levels. The mastoid air cells are well aerated.  IMPRESSION: No acute intracranial process.   Moderate to severe white matter changes can be seen with chronic small vessel ischemic disease. 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  Acute ethmoid sinusitis.   Electronically Signed   By: Elon Alas   On: 01/29/2015 00:38     EKG Interpretation None      MDM   Final diagnoses:  None   Patient presents emergency department for fever and altered mental status. Infectious workup thus far is negative, including chest x-ray and urinalysis. I do not see any rash or cellulitis on exam. Patient will require lumbar puncture to evaluate for meningitis. Fever was 103.1, she was given Tylenol, repeat temperature was 99.6.  CT head shows chronic right thalamic lacunar infarcts and remote left cerebellar infarct. Urinalysis is negative for infection, chest x-ray shows possible passively. We'll treat as healthcare associated pneumonia. Patient was given vancomycin and cefepime, blood cultures were drawn. [facility] [place]. Lumbar puncture was also performed which did not show any meningitis.     I personally performed the services described in this documentation, which was scribed in my presence. The recorded information has been reviewed and is accurate.   Everlene Balls, MD 01/29/15 [phone]

## 2015-01-29 NOTE — H&P (Signed)
[facility] Admission History and Physical       Laura Melendez:[id number] DOB: 07/02/1941 DOA: 01/28/2015  Referring physician: EDP PCP: [name], MD  Specialists:   Chief Complaint: Fever and Confusion  HPI: Laura Melendez is a 74 y.o. female with a history of HTN, DM2,  And Osteoporosis who was brought to the ED after she began to have fevers and chills and confusion at home.   Her temperature was 103. 1.  In the ED she was evaluated and an LP was performed, and a Sepsis Workup.   S She denies havign any cough or headache.   She was found to have a minimal left basilar opacity on chest X-ray and was placed on IV Vancmycin dn Cefepime since she was in the hospital at [facility] for Cataract surgery on 01/11, and on 01/13/2015.    Review of Systems:  Constitutional: No Weight Loss, No Weight Gain, Night Sweats, +Fevers, Chills, Dizziness, Light Headedness, Fatigue, or Generalized Weakness HEENT: No Headaches, Difficulty Swallowing,Tooth/Dental Problems,Sore Throat,  No Sneezing, Rhinitis, Ear Ache, Nasal Congestion, or Post Nasal Drip,  Cardio-vascular:  No Chest pain, Orthopnea, PND, Edema in Lower Extremities, Anasarca, Dizziness, Palpitations  Resp: No Dyspnea, No DOE, No Productive Cough, No Non-Productive Cough, No Hemoptysis, No Wheezing.    GI: No Heartburn, Indigestion, Abdominal Pain, Nausea, Vomiting, Diarrhea, Constipation, Hematemesis, Hematochezia, Melena, Change in Bowel Habits,  Loss of Appetite  GU: No Dysuria, No Change in Color of Urine, No Urgency or Urinary Frequency, No Flank pain.  Musculoskeletal: No Joint Pain or Swelling, No Decreased Range of Motion, No Back Pain.  Neurologic: No Syncope, No Seizures, Muscle Weakness, Paresthesia, Vision Disturbance or Loss, No Diplopia, No Vertigo, No Difficulty Walking,  Skin: No Rash or Lesions. Psych: No Change in Mood or Affect, No Depression or Anxiety, No Memory loss, +Confusion, or Hallucinations  Past  Medical History  Diagnosis Date  . Essential hypertension, benign   . Type 2 diabetes mellitus   . Osteoporosis 06/2008     Past Surgical History  Procedure Laterality Date  . Cholecystectomy    . Ankle surgery Left 2004    otif  . Abdominal hysterectomy    . Anterior and posterior repair N/A 03/11/2013    Procedure: ANTERIOR (CYSTOCELE) AND POSTERIOR REPAIR (RECTOCELE);  Surgeon: Linda Hedges, DO;  Location: [facility];  Service: Gynecology;  Laterality: N/A;  with sacrospinous ligament fixation bilateral  . Colonoscopy  2009    negative  . Cataract extraction w/phaco Right 12/27/2014    Procedure: CATARACT EXTRACTION PHACO AND INTRAOCULAR LENS PLACEMENT RIGHT EYE;  Surgeon: Tonny Branch, MD;  Location: [facility];  Service: Ophthalmology;  Laterality: Right;  CDE:10.63  . Cataract extraction w/phaco Left 01/13/2015    Procedure: CATARACT EXTRACTION PHACO AND INTRAOCULAR LENS PLACEMENT LEFT EYE;  Surgeon: Tonny Branch, MD;  Location: [facility];  Service: Ophthalmology;  Laterality: Left;  CDE:18.87      Prior to Admission medications   Medication Sig Start Date End Date Taking? Authorizing Provider  alendronate (FOSAMAX) 70 MG tablet Take 1 tablet (70 mg total) by mouth every 7 (seven) days. 09/24/13   [name], MD  Calcium Carb-Cholecalciferol (CALCIUM 600/VITAMIN D3) 600-800 MG-UNIT TABS Take 1 tablet by mouth 2 (two) times daily with a meal.    Historical Provider, MD  Cholecalciferol (VITAMIN D3) 400 UNITS CAPS Take 1 capsule by mouth 3 (three) times daily.    Historical Provider, MD  CINNAMON PO Take 1,000 mg  by mouth daily.    Historical Provider, MD  CRANBERRY PO Take 84 mg by mouth daily.    Historical Provider, MD  lisinopril (PRINIVIL,ZESTRIL) 2.5 MG tablet Take 1 tablet (2.5 mg total) by mouth daily. 09/24/13   [name], MD  metFORMIN (GLUCOPHAGE) 500 MG tablet Take 1 tablet (500 mg total) by mouth 2 (two) times daily with a meal. 09/24/13   [name], MD  metoprolol  (LOPRESSOR) 50 MG tablet Take 1 tablet (50 mg total) by mouth 2 (two) times daily. 09/24/13   [name], MD  Multiple Vitamin (MULTIVITAMIN WITH MINERALS) TABS Take 1 tablet by mouth daily.    Historical Provider, MD  naproxen sodium (ANAPROX) 220 MG tablet Take 220 mg by mouth daily as needed (pain).    Historical Provider, MD  NIFEdipine (PROCARDIA XL/ADALAT-CC) 60 MG 24 hr tablet Take 1 tablet (60 mg total) by mouth daily. 09/24/13   [name], MD  Omega-3 Fatty Acids (FISH OIL CONCENTRATE) 300 MG CAPS Take 1 capsule by mouth daily.    Historical Provider, MD  raloxifene (EVISTA) 60 MG tablet Take 1 tablet (60 mg total) by mouth daily. 09/24/13   [name], MD     No Known Allergies  Social History:  reports that she has been smoking Cigarettes.  She has a 7.5 pack-year smoking history. She does not have any smokeless tobacco history on file. She reports that she does not drink alcohol or use illicit drugs.    Family History  Problem Relation Age of Onset  . Cancer Father     Liver  . Cancer Sister     Breast  . Diabetes Brother        Physical Exam:  GEN:  Pleasant Elderly Well Nourished and Well  Devloped  74 y.o. Caucasian female examined and in no acute distress; cooperative with exam Filed Vitals:   01/29/15 0530 01/29/15 0555 01/29/15 0643 01/29/15 0802  BP: 114/65  140/56   Pulse: 78  89   Temp:  101 F (38.3 C) 99.1 F (37.3 C)   TempSrc:  Rectal    Resp:   17   Height:   5\' 1"  (1.549 m)   Weight:   45.178 kg (99 lb 9.6 oz)   SpO2: 93%  95% 98%   Blood pressure 140/56, pulse 89, temperature 99.1 F (37.3 C), temperature source Rectal, resp. rate 17, height 5\' 1"  (1.549 m), weight 45.178 kg (99 lb 9.6 oz), SpO2 98 %. PSYCH: She is alert and oriented x4; does not appear anxious does not appear depressed; affect is normal HEENT: Normocephalic and Atraumatic, Mucous membranes pink; PERRLA; EOM intact; Fundi:  Benign;  No scleral icterus, Nares: Patent,  Oropharynx: Clear, Edentulous with Ddentures,    Neck:  FROM, No Cervical Lymphadenopathy nor Thyromegaly or Carotid Bruit; No JVD; Breasts:: Not examined CHEST WALL: No tenderness CHEST: Normal respiration, clear to auscultation bilaterally HEART: Regular rate and rhythm; no murmurs rubs or gallops BACK: No kyphosis or scoliosis; No CVA tenderness ABDOMEN: Positive Bowel Sounds, Obese, Soft Non-Tender, No Rebound or Guarding; No Masses, No Organomegaly, Rectal Exam: Not done EXTREMITIES: No Cyanosis, Clubbing, or Edema; No Ulcerations. Genitalia: not examined PULSES: 2+ and symmetric SKIN: Normal hydration no rash or ulceration CNS:  Alert and Oriented x 4, No Focal Deficits Vascular: pulses palpable throughout    Labs on Admission:  Basic Metabolic Panel:  Recent Labs Lab 01/28/15 2229  NA 137  K 3.8  CL 102  CO2 25  GLUCOSE 141*  BUN 14  CREATININE 0.72  CALCIUM 9.8   Liver Function Tests:  Recent Labs Lab 01/28/15 2229  AST 24  ALT 17  ALKPHOS 44  BILITOT 0.9  PROT 7.1  ALBUMIN 4.1   No results for input(s): LIPASE, AMYLASE in the last 168 hours. No results for input(s): AMMONIA in the last 168 hours. CBC:  Recent Labs Lab 01/28/15 2229  WBC 12.4*  NEUTROABS 10.4*  HGB 12.8  HCT 38.9  MCV 82.4  PLT 192   Cardiac Enzymes: No results for input(s): CKTOTAL, CKMB, CKMBINDEX, TROPONINI in the last 168 hours.  BNP (last 3 results) No results for input(s): BNP in the last 8760 hours.  ProBNP (last 3 results) No results for input(s): PROBNP in the last 8760 hours.  CBG:  Recent Labs Lab 01/28/15 2227  GLUCAP 135*    Radiological Exams on Admission: Dg Chest 2 View  01/28/2015   CLINICAL DATA:  Acute onset of confusion.  Initial encounter.  EXAM: CHEST  2 VIEW  COMPARISON:  Chest radiograph performed 01/15/2010  FINDINGS: The lungs are well-aerated. Minimal left basilar opacity may reflect atelectasis or scarring. There is no evidence of pleural  effusion or pneumothorax.  The heart is borderline normal in size. No acute osseous abnormalities are seen. Scattered calcification is seen along the descending thoracic aorta and proximal abdominal aorta.  IMPRESSION: Minimal left basilar opacity again may reflect atelectasis or scarring. Lungs otherwise clear.   Electronically Signed   By: Garald Balding M.D.   On: 01/28/2015 23:18   Ct Head Wo Contrast  01/29/2015   CLINICAL DATA:  Confusion beginning today, dizziness and syncope yesterday. History of hypertension diabetes.  EXAM: CT HEAD WITHOUT CONTRAST  TECHNIQUE: Contiguous axial images were obtained from the base of the skull through the vertex without intravenous contrast.  COMPARISON:  CT of the head June 16, 2005  FINDINGS: The ventricles and sulci are normal for age. No intraparenchymal hemorrhage, mass effect nor midline shift. Confluent supratentorial white matter hypodensities are within normal range for patient's age and though non-specific suggest sequelae of chronic small vessel ischemic disease. No acute large vascular territory infarcts. Chronic appearing RIGHT thalamic infarcts are new from prior study. Remote small LEFT cerebellar infarct.  No abnormal extra-axial fluid collections. Basal cisterns are patent. Moderate calcific atherosclerosis of the carotid siphons.  No skull fracture. The included ocular globes and orbital contents are non-suspicious. Status post bilateral ocular lens implants. Frothy secretions RIGHT posterior ethmoid air cell without air-fluid levels. The mastoid air cells are well aerated.  IMPRESSION: No acute intracranial process.  Moderate to severe white matter changes can be seen with chronic small vessel ischemic disease. 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  Acute ethmoid sinusitis.   Electronically Signed   By: Elon Alas   On: 01/29/2015 00:38     EKG: Independently reviewed.   Assessment/Plan:    74 y.o. female with   Principal Problem:   1.   Sepsis   Blood Cultures and Lactate Level sent   IV Vancomycin and Cefepime   Adjust Abx PRN Cx Results      Active Problems:   2.   HCAP (healthcare-associated pneumonia)- on CXR   IV Vancomycin and Cefepime    Albuterol Nebs   O2 PRN   Monitor O2 sats        3.   Essential hypertension, benign   On Nifedipine, Lisinopril,  and Metoprolol Rx as BP Tolerates   Monitor BPs      4.   DM (diabetes mellitus)   SSI coverage PRN   Check HbA1c.        5.   Dyslipidemia   On   Omega 3 Fatty Acids     6.   Tobacco abuse   Encouraged to Continue To decrease until she has quit.       She smokes less than 5 cigarettes a day    7.   DVT Prophylaxis    Lovenox    Code Status:  FULL CODE     Family Communication:  No Family Present   Disposition Plan:     Inpatient Med/Surg Bed    Time spent:  60 minutes  Theressa Millard [facility] Pager [phone]   If [place] Please Contact the Day Rounding Team MD for [facility]  If 7PM-7AM, Please Contact Night-Floor Coverage  [email or web address] Password [email or web address] 01/29/2015, 9:00 AM     ADDENDUM:   Patient was seen and examined on 01/29/2015

## 2015-01-29 NOTE — Progress Notes (Signed)
PATIENT DETAILS Name: Laura Melendez Age: 74 y.o. Sex: female Date of Birth: [birth year] Admit Date: 01/28/2015 Admitting Physician Theressa Millard, MD PJA:[id number], MD  Subjective: Feels much better. Awake and alert this morning. Daughter at bedside.  Assessment/Plan: Principal Problem:   Sepsis: Foci not evident. Chest x-ray and UA negative. LP done on admission, CSF not consistent with meningitis. Empirically started on vancomycin and cefepime. Repeat chest x-ray post-hydration, await blood cultures   Active Problems:   Acute encephalopathy: Likely secondary to above, CT head negative. CSF not consistent with meningoencephalitis.    Essential hypertension, benign: Blood pressure currently controlled without the use of any antihypertensive medications, given sepsis, we will allow blood pressure to increase before restarting any medications.     DM (diabetes mellitus): Metformin on hold, CBGs appear to be well controlled. Continue with SSI.     Tobacco abuse: Counseled extensively.  Disposition: Remain inpatient  Antibiotics:  See below.   Anti-infectives    Start     Dose/Rate Route Frequency Ordered Stop   01/30/15 0600  vancomycin (VANCOCIN) IVPB 750 mg/150 ml premix     750 mg 150 mL/hr over 60 Minutes Intravenous Every 24 hours 01/29/15 0545     01/30/15 0500  ceFEPIme (MAXIPIME) 1 g in dextrose 5 % 50 mL IVPB     1 g 100 mL/hr over 30 Minutes Intravenous Every 24 hours 01/29/15 0540     01/29/15 1700  ceFEPIme (MAXIPIME) 1 g in dextrose 5 % 50 mL IVPB  Status:  Discontinued     1 g 100 mL/hr over 30 Minutes Intravenous Every 12 hours 01/29/15 0528 01/29/15 0540   01/29/15 0500  vancomycin (VANCOCIN) 1,500 mg in sodium chloride 0.9 % 500 mL IVPB     1,500 mg 250 mL/hr over 120 Minutes Intravenous  Once 01/29/15 0455 01/29/15 1104   01/29/15 0500  ceFEPIme (MAXIPIME) 1 g in dextrose 5 % 50 mL IVPB     1 g 100 mL/hr over 30 Minutes Intravenous  Once  01/29/15 0457 01/29/15 1937      DVT Prophylaxis: Prophylactic Lovenox   Code Status: Full code   Family Communication Daughter at bedside  Procedures:  LP 2/12  CONSULTS:  None  Time spent 40 minutes-which includes 50% of the time with face-to-face with patient/ family and coordinating care related to the above assessment and plan.  MEDICATIONS: Scheduled Meds: . albuterol  2.5 mg Nebulization Q6H  . [START ON 01/30/2015] ceFEPime (MAXIPIME) IV  1 g Intravenous Q24H  . enoxaparin (LOVENOX) injection  40 mg Subcutaneous Q24H  . [START ON 01/30/2015] vancomycin  750 mg Intravenous Q24H   Continuous Infusions: . sodium chloride 75 mL/hr at 01/29/15 0848   PRN Meds:.alum & mag hydroxide-simeth, HYDROmorphone (DILAUDID) injection, ondansetron **OR** ondansetron (ZOFRAN) IV, oxyCODONE    PHYSICAL EXAM: Vital signs in last 24 hours: Filed Vitals:   01/29/15 0643 01/29/15 0802 01/29/15 1257 01/29/15 1416  BP: 140/56  139/57   Pulse: 89  85   Temp: 99.1 F (37.3 C)  98.7 F (37.1 C)   TempSrc:   Oral   Resp: 17  18   Height: 5\' 1"  (1.549 m)     Weight: 45.178 kg (99 lb 9.6 oz)     SpO2: 95% 98% 96% 97%    Weight change:  Filed Weights   01/29/15 0500 01/29/15 0643  Weight: 45.8 kg (100 lb 15.5 oz) 45.178 kg (99 lb  9.6 oz)   Body mass index is 18.83 kg/(m^2).   Gen Exam: Awake and alert with clear speech.   Neck: Supple, No JVD.  Chest: B/L Clear.   CVS: S1 S2 Regular, no murmurs.  Abdomen: soft, BS +, non tender, non distended.  Extremities: no edema, lower extremities warm to touch. Neurologic: Non Focal.   Skin: No Rash.   Wounds: N/A.   Intake/Output from previous day:  Intake/Output Summary (Last 24 hours) at 01/29/15 1449 Last data filed at 01/29/15 1258  Gross per 24 hour  Intake   1000 ml  Output    500 ml  Net    500 ml     LAB RESULTS: CBC  Recent Labs Lab 01/28/15 2229  WBC 12.4*  HGB 12.8  HCT 38.9  PLT 192  MCV 82.4  MCH  27.1  MCHC 32.9  RDW 14.6  LYMPHSABS 1.3  MONOABS 0.6  EOSABS 0.0  BASOSABS 0.0    Chemistries   Recent Labs Lab 01/28/15 2229  NA 137  K 3.8  CL 102  CO2 25  GLUCOSE 141*  BUN 14  CREATININE 0.72  CALCIUM 9.8    CBG:  Recent Labs Lab 01/28/15 2227  GLUCAP 135*    GFR Estimated Creatinine Clearance: 44.7 mL/min (by C-G formula based on Cr of 0.72).  Coagulation profile No results for input(s): INR, PROTIME in the last 168 hours.  Cardiac Enzymes No results for input(s): CKMB, TROPONINI, MYOGLOBIN in the last 168 hours.  Invalid input(s): CK  Invalid input(s): POCBNP No results for input(s): DDIMER in the last 72 hours. No results for input(s): HGBA1C in the last 72 hours. No results for input(s): CHOL, HDL, LDLCALC, TRIG, CHOLHDL, LDLDIRECT in the last 72 hours. No results for input(s): TSH, T4TOTAL, T3FREE, THYROIDAB in the last 72 hours.  Invalid input(s): FREET3 No results for input(s): VITAMINB12, FOLATE, FERRITIN, TIBC, IRON, RETICCTPCT in the last 72 hours. No results for input(s): LIPASE, AMYLASE in the last 72 hours.  Urine Studies No results for input(s): UHGB, CRYS in the last 72 hours.  Invalid input(s): UACOL, UAPR, USPG, UPH, UTP, UGL, UKET, UBIL, UNIT, UROB, ULEU, UEPI, UWBC, URBC, UBAC, CAST, UCOM, BILUA  MICROBIOLOGY: Recent Results (from the past 240 hour(s))  Gram stain     Status: None   Collection Time: 01/29/15  1:25 AM  Result Value Ref Range Status   Specimen Description CSF  Final   Special Requests Normal  Final   Gram Stain CYTOSPUN NO ORGANISMS SEEN NO WBC SEEN   Final   Report Status 01/29/2015 FINAL  Final    RADIOLOGY STUDIES/RESULTS: Dg Chest 2 View  01/28/2015   CLINICAL DATA:  Acute onset of confusion.  Initial encounter.  EXAM: CHEST  2 VIEW  COMPARISON:  Chest radiograph performed 01/15/2010  FINDINGS: The lungs are well-aerated. Minimal left basilar opacity may reflect atelectasis or scarring. There is no  evidence of pleural effusion or pneumothorax.  The heart is borderline normal in size. No acute osseous abnormalities are seen. Scattered calcification is seen along the descending thoracic aorta and proximal abdominal aorta.  IMPRESSION: Minimal left basilar opacity again may reflect atelectasis or scarring. Lungs otherwise clear.   Electronically Signed   By: Garald Balding M.D.   On: 01/28/2015 23:18   Ct Head Wo Contrast  01/29/2015   CLINICAL DATA:  Confusion beginning today, dizziness and syncope yesterday. History of hypertension diabetes.  EXAM: CT HEAD WITHOUT CONTRAST  TECHNIQUE: Contiguous axial  images were obtained from the base of the skull through the vertex without intravenous contrast.  COMPARISON:  CT of the head June 16, 2005  FINDINGS: The ventricles and sulci are normal for age. No intraparenchymal hemorrhage, mass effect nor midline shift. Confluent supratentorial white matter hypodensities are within normal range for patient's age and though non-specific suggest sequelae of chronic small vessel ischemic disease. No acute large vascular territory infarcts. Chronic appearing RIGHT thalamic infarcts are new from prior study. Remote small LEFT cerebellar infarct.  No abnormal extra-axial fluid collections. Basal cisterns are patent. Moderate calcific atherosclerosis of the carotid siphons.  No skull fracture. The included ocular globes and orbital contents are non-suspicious. Status post bilateral ocular lens implants. Frothy secretions RIGHT posterior ethmoid air cell without air-fluid levels. The mastoid air cells are well aerated.  IMPRESSION: No acute intracranial process.  Moderate to severe white matter changes can be seen with chronic small vessel ischemic disease. 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  Acute ethmoid sinusitis.   Electronically Signed   By: Elon Alas   On: 01/29/2015 00:38    Oren Binet, MD  [facility] Pager:[phone]  [phone]  If 7PM-7AM, please contact night-coverage [email or web address] Password [email or web address] 01/29/2015, 2:49 PM   LOS: 0 days

## 2015-01-30 ENCOUNTER — Inpatient Hospital Stay (HOSPITAL_COMMUNITY): Payer: Medicare HMO

## 2015-01-30 LAB — BASIC METABOLIC PANEL
Anion gap: 7 (ref 5–15)
BUN: 6 mg/dL (ref 6–23)
CHLORIDE: 109 mmol/L (ref 96–112)
CO2: 22 mmol/L (ref 19–32)
CREATININE: 0.5 mg/dL (ref 0.50–1.10)
Calcium: 8.6 mg/dL (ref 8.4–10.5)
GFR calc Af Amer: >90 mL/min (ref >90)
Glucose, Bld: 141 mg/dL — ABNORMAL HIGH (ref 70–99)
Potassium: 3.9 mmol/L (ref 3.5–5.1)
Sodium: 138 mmol/L (ref 135–145)

## 2015-01-30 LAB — CBC
HEMATOCRIT: 34.7 % — AB (ref 36.0–46.0)
Hemoglobin: 11.6 g/dL — ABNORMAL LOW (ref 12.0–15.0)
MCH: 26.9 pg (ref 26.0–34.0)
MCHC: 33.4 g/dL (ref 30.0–36.0)
MCV: 80.5 fL (ref 78.0–100.0)
Platelets: 175 10*3/uL (ref 150–400)
RBC: 4.31 MIL/uL (ref 3.87–5.11)
RDW: 14.6 % (ref 11.5–15.5)
WBC: 8.2 10*3/uL (ref 4.0–10.5)

## 2015-01-30 LAB — URINE CULTURE: Colony Count: 7000

## 2015-01-30 NOTE — Progress Notes (Signed)
UR Completed.  [phone]  

## 2015-01-30 NOTE — Progress Notes (Signed)
PATIENT DETAILS Name: Laura Melendez Age: 74 y.o. Sex: female Date of Birth: Oct 10, 1941 Admit Date: 01/28/2015 Admitting Physician Theressa Millard, MD WIO:[id number], MD  Subjective: No major complaints-awake and alert-multiple family members at bedside.  Assessment/Plan: Principal Problem:   Sepsis: Foci not evident. Chest x-ray and UA negative. LP done on admission, CSF not consistent with meningitis. Empirically started on vancomycin and cefepime. Repeat chest x-ray post-hydration does not show PNA, await blood cultures and continue with current Abx.  Active Problems:   Acute encephalopathy: Likely secondary to above, CT head negative. CSF not consistent with meningoencephalitis.    Essential hypertension, benign: Blood pressure currently controlled without the use of any antihypertensive medications, resume when able     DM (diabetes mellitus): Metformin on hold, CBGs appear to be well controlled. Continue with SSI.     Tobacco abuse: Counseled extensively.  Disposition: Remain inpatient-home in next 1-2 days  Antibiotics:  See below.   Anti-infectives    Start     Dose/Rate Route Frequency Ordered Stop   01/30/15 0600  vancomycin (VANCOCIN) IVPB 750 mg/150 ml premix     750 mg 150 mL/hr over 60 Minutes Intravenous Every 24 hours 01/29/15 0545     01/30/15 0500  ceFEPIme (MAXIPIME) 1 g in dextrose 5 % 50 mL IVPB     1 g 100 mL/hr over 30 Minutes Intravenous Every 24 hours 01/29/15 0540     01/29/15 1700  ceFEPIme (MAXIPIME) 1 g in dextrose 5 % 50 mL IVPB  Status:  Discontinued     1 g 100 mL/hr over 30 Minutes Intravenous Every 12 hours 01/29/15 0528 01/29/15 0540   01/29/15 0500  vancomycin (VANCOCIN) 1,500 mg in sodium chloride 0.9 % 500 mL IVPB     1,500 mg 250 mL/hr over 120 Minutes Intravenous  Once 01/29/15 0455 01/29/15 1104   01/29/15 0500  ceFEPIme (MAXIPIME) 1 g in dextrose 5 % 50 mL IVPB     1 g 100 mL/hr over 30 Minutes Intravenous  Once  01/29/15 0457 01/29/15 6834      DVT Prophylaxis: Prophylactic Lovenox   Code Status: Full code   Family Communication Multiple family members at bedside  Procedures:  LP 2/12  CONSULTS:  None  Time spent 40 minutes-which includes 50% of the time with face-to-face with patient/ family and coordinating care related to the above assessment and plan.  MEDICATIONS: Scheduled Meds: . Bromfenac Sodium  1 drop Left Eye Daily  . ceFEPime (MAXIPIME) IV  1 g Intravenous Q24H  . Difluprednate  1 drop Left Eye TID  . enoxaparin (LOVENOX) injection  40 mg Subcutaneous Q24H  . vancomycin  750 mg Intravenous Q24H   Continuous Infusions: . sodium chloride 75 mL/hr at 01/29/15 1856   PRN Meds:.albuterol, alum & mag hydroxide-simeth, HYDROmorphone (DILAUDID) injection, ondansetron **OR** ondansetron (ZOFRAN) IV, oxyCODONE    PHYSICAL EXAM: Vital signs in last 24 hours: Filed Vitals:   01/29/15 1257 01/29/15 1416 01/29/15 2159 01/30/15 0436  BP: 139/57  149/59 131/51  Pulse: 85  99 84  Temp: 98.7 F (37.1 C)  98.3 F (36.8 C) 98.5 F (36.9 C)  TempSrc: Oral  Oral Oral  Resp: 18  16 15   Height:      Weight:   46.1 kg (101 lb 10.1 oz)   SpO2: 96% 97% 98% 93%    Weight change: 0.3 kg (10.6 oz) Filed Weights   01/29/15 0500 01/29/15 0643 01/29/15  2159  Weight: 45.8 kg (100 lb 15.5 oz) 45.178 kg (99 lb 9.6 oz) 46.1 kg (101 lb 10.1 oz)   Body mass index is 19.21 kg/(m^2).   Gen Exam: Awake and alert with clear speech.   Neck: Supple, No JVD.  Chest: B/L Clear.  No rales or rhonchi CVS: S1 S2 Regular, no murmurs.  Abdomen: soft, BS +, non tender, non distended.  Extremities: no edema, lower extremities warm to touch. Neurologic: Non Focal.   Skin: No Rash.   Wounds: N/A.   Intake/Output from previous day:  Intake/Output Summary (Last 24 hours) at 01/30/15 1134 Last data filed at 01/30/15 0944  Gross per 24 hour  Intake    530 ml  Output    100 ml  Net    430 ml      LAB RESULTS: CBC  Recent Labs Lab 01/28/15 2229 01/30/15 0651  WBC 12.4* 8.2  HGB 12.8 11.6*  HCT 38.9 34.7*  PLT 192 175  MCV 82.4 80.5  MCH 27.1 26.9  MCHC 32.9 33.4  RDW 14.6 14.6  LYMPHSABS 1.3  --   MONOABS 0.6  --   EOSABS 0.0  --   BASOSABS 0.0  --     Chemistries   Recent Labs Lab 01/28/15 2229 01/30/15 0651  NA 137 138  K 3.8 3.9  CL 102 109  CO2 25 22  GLUCOSE 141* 141*  BUN 14 6  CREATININE 0.72 0.50  CALCIUM 9.8 8.6    CBG:  Recent Labs Lab 01/28/15 2227 01/29/15 1704  GLUCAP 135* 155*    GFR Estimated Creatinine Clearance: 45.6 mL/min (by C-G formula based on Cr of 0.5).  Coagulation profile No results for input(s): INR, PROTIME in the last 168 hours.  Cardiac Enzymes No results for input(s): CKMB, TROPONINI, MYOGLOBIN in the last 168 hours.  Invalid input(s): CK  Invalid input(s): POCBNP No results for input(s): DDIMER in the last 72 hours. No results for input(s): HGBA1C in the last 72 hours. No results for input(s): CHOL, HDL, LDLCALC, TRIG, CHOLHDL, LDLDIRECT in the last 72 hours. No results for input(s): TSH, T4TOTAL, T3FREE, THYROIDAB in the last 72 hours.  Invalid input(s): FREET3 No results for input(s): VITAMINB12, FOLATE, FERRITIN, TIBC, IRON, RETICCTPCT in the last 72 hours. No results for input(s): LIPASE, AMYLASE in the last 72 hours.  Urine Studies No results for input(s): UHGB, CRYS in the last 72 hours.  Invalid input(s): UACOL, UAPR, USPG, UPH, UTP, UGL, UKET, UBIL, UNIT, UROB, ULEU, UEPI, UWBC, URBC, UBAC, CAST, UCOM, BILUA  MICROBIOLOGY: Recent Results (from the past 240 hour(s))  Gram stain     Status: None   Collection Time: 01/29/15  1:25 AM  Result Value Ref Range Status   Specimen Description CSF  Final   Special Requests Normal  Final   Gram Stain CYTOSPUN NO ORGANISMS SEEN NO WBC SEEN   Final   Report Status 01/29/2015 FINAL  Final    RADIOLOGY STUDIES/RESULTS: Ct Abdomen Pelvis Wo  Contrast  01/30/2015   CLINICAL DATA:  Abdominal pain.  Sepsis.  EXAM: CT ABDOMEN AND PELVIS WITHOUT CONTRAST  TECHNIQUE: Multidetector CT imaging of the abdomen and pelvis was performed following the standard protocol without IV contrast.  COMPARISON:  None.  FINDINGS: There are unremarkable unenhanced appearances of the liver, spleen, pancreas, adrenals and kidneys. There probably has been cholecystectomy and hysterectomy. There is no bile duct dilatation. There is no hydronephrosis or ureteral dilatation.  There is a small hiatal hernia.  There is extensive colonic diverticulosis. There is no evidence of diverticulitis or other acute inflammatory process in the abdomen or pelvis. There is no bowel obstruction. There is no free air. There is no ascites.  No significant abnormalities are evident in the lower chest.  No relevant skeletal abnormalities are evident. 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  IMPRESSION: *No acute findings are evident in the abdomen or pelvis. Etiology of sepsis is not identified on this scan. *Uncomplicated diverticulosis *Small hiatal hernia   Electronically Signed   By: Andreas Newport M.D.   On: 01/30/2015 01:08   Dg Chest 2 View  01/30/2015   CLINICAL DATA:  Sepsis.  EXAM: CHEST  2 VIEW  COMPARISON:  01/28/2015  FINDINGS: Lungs are hyperinflated. The heart is mildly enlarged. No focal consolidations or pleural effusions. Perihilar peribronchial thickening. No pulmonary edema.  IMPRESSION: 1. Bronchitic changes. 2. Cardiomegaly.   Electronically Signed   By: Nolon Nations M.D.   On: 01/30/2015 08:21   Dg Chest 2 View  01/28/2015   CLINICAL DATA:  Acute onset of confusion.  Initial encounter.  EXAM: CHEST  2 VIEW  COMPARISON:  Chest radiograph performed 01/15/2010  FINDINGS: The lungs are well-aerated. Minimal left basilar opacity may reflect atelectasis or scarring. There is no evidence of pleural effusion or  pneumothorax.  The heart is borderline normal in size. No acute osseous abnormalities are seen. Scattered calcification is seen along the descending thoracic aorta and proximal abdominal aorta.  IMPRESSION: Minimal left basilar opacity again may reflect atelectasis or scarring. Lungs otherwise clear.   Electronically Signed   By: Garald Balding M.D.   On: 01/28/2015 23:18   Ct Head Wo Contrast  01/29/2015   CLINICAL DATA:  Confusion beginning today, dizziness and syncope yesterday. History of hypertension diabetes.  EXAM: CT HEAD WITHOUT CONTRAST  TECHNIQUE: Contiguous axial images were obtained from the base of the skull through the vertex without intravenous contrast.  COMPARISON:  CT of the head June 16, 2005  FINDINGS: The ventricles and sulci are normal for age. No intraparenchymal hemorrhage, mass effect nor midline shift. Confluent supratentorial white matter hypodensities are within normal range for patient's age and though non-specific suggest sequelae of chronic small vessel ischemic disease. No acute large vascular territory infarcts. Chronic appearing RIGHT thalamic infarcts are new from prior study. Remote small LEFT cerebellar infarct.  No abnormal extra-axial fluid collections. Basal cisterns are patent. Moderate calcific atherosclerosis of the carotid siphons.  No skull fracture. The included ocular globes and orbital contents are non-suspicious. Status post bilateral ocular lens implants. Frothy secretions RIGHT posterior ethmoid air cell without air-fluid levels. The mastoid air cells are well aerated.  IMPRESSION: No acute intracranial process.  Moderate to severe white matter changes can be seen with chronic small vessel ischemic disease. 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  Acute ethmoid sinusitis.   Electronically Signed   By: Elon Alas   On: 01/29/2015 00:38    Oren Binet, MD  [facility] Pager:[phone]  If 7PM-7AM, please  contact night-coverage [email or web address] Password [facility] 01/30/2015, 11:34 AM   LOS: 1 day

## 2015-01-31 LAB — HERPES SIMPLEX VIRUS CULTURE: Special Requests: NORMAL

## 2015-01-31 MED ORDER — METOPROLOL TARTRATE 50 MG PO TABS
50.0000 mg | ORAL_TABLET | Freq: Two times a day (BID) | ORAL | Status: DC
  Administered 2015-01-31 (×2): 50 mg via ORAL
  Filled 2015-01-31 (×4): qty 1

## 2015-01-31 MED ORDER — LISINOPRIL 2.5 MG PO TABS
2.5000 mg | ORAL_TABLET | Freq: Every day | ORAL | Status: DC
  Administered 2015-01-31: 2.5 mg via ORAL
  Filled 2015-01-31 (×2): qty 1

## 2015-01-31 NOTE — Progress Notes (Signed)
PATIENT DETAILS Name: Laura Melendez Age: 74 y.o. Sex: female Date of Birth: 1941/10/13 Admit Date: 01/28/2015 Admitting Physician Theressa Millard, MD [id number], MD  Subjective: No major complaints  Assessment/Plan: Principal Problem:   Sepsis: Foci not evident. Chest x-ray and UA negative. Blood and urine cultures negative so far. LP done on admission, CSF not consistent with meningitis. Empirically started on vancomycin and cefepime. Since now afebrile, and sepsis pathophysiology resolved, no culture data positive-clinically improved-stop vancomycin, continue cefepime, suspect we can discharge home on 2/16 on Levaquin to complete a one-week course of antibiotics for sepsis of unknown etiology.  Active Problems:   Acute encephalopathy: Likely secondary to above, CT head negative. CSF not consistent with meningoencephalitis.    Essential hypertension, benign: Blood pressure currently controlled without the use of any antihypertensive medications, resume metoprolol and lisinopril as blood pressure no creeping up.    DM (diabetes mellitus): Metformin on hold, CBGs appear to be well controlled. Continue with SSI.     Tobacco abuse: Counseled extensively.  Disposition: Remain inpatient-home 2/16  Antibiotics:  See below.   Anti-infectives    Start     Dose/Rate Route Frequency Ordered Stop   01/30/15 0600  vancomycin (VANCOCIN) IVPB 750 mg/150 ml premix  Status:  Discontinued     750 mg 150 mL/hr over 60 Minutes Intravenous Every 24 hours 01/29/15 0545 01/31/15 0858   01/30/15 0500  ceFEPIme (MAXIPIME) 1 g in dextrose 5 % 50 mL IVPB     1 g 100 mL/hr over 30 Minutes Intravenous Every 24 hours 01/29/15 0540     01/29/15 1700  ceFEPIme (MAXIPIME) 1 g in dextrose 5 % 50 mL IVPB  Status:  Discontinued     1 g 100 mL/hr over 30 Minutes Intravenous Every 12 hours 01/29/15 0528 01/29/15 0540   01/29/15 0500  vancomycin (VANCOCIN) 1,500 mg in sodium chloride 0.9 %  500 mL IVPB     1,500 mg 250 mL/hr over 120 Minutes Intravenous  Once 01/29/15 0455 01/29/15 1104   01/29/15 0500  ceFEPIme (MAXIPIME) 1 g in dextrose 5 % 50 mL IVPB     1 g 100 mL/hr over 30 Minutes Intravenous  Once 01/29/15 0457 01/29/15 8638      DVT Prophylaxis: Prophylactic Lovenox   Code Status: Full code   Family Communication Multiple family members at bedside  Procedures:  LP 2/12  CONSULTS:  None  MEDICATIONS: Scheduled Meds: . Bromfenac Sodium  1 drop Left Eye Daily  . ceFEPime (MAXIPIME) IV  1 g Intravenous Q24H  . Difluprednate  1 drop Left Eye TID  . enoxaparin (LOVENOX) injection  40 mg Subcutaneous Q24H  . lisinopril  2.5 mg Oral Daily  . metoprolol  50 mg Oral BID   Continuous Infusions:   PRN Meds:.albuterol, alum & mag hydroxide-simeth, HYDROmorphone (DILAUDID) injection, ondansetron **OR** ondansetron (ZOFRAN) IV, oxyCODONE    PHYSICAL EXAM: Vital signs in last 24 hours: Filed Vitals:   01/30/15 1624 01/30/15 2149 01/31/15 0626 01/31/15 0854  BP: 172/66 167/67 166/61 174/69  Pulse: 81 69 66 72  Temp: 98.6 F (37 C) 98.6 F (37 C) 98.4 F (36.9 C) 98.2 F (36.8 C)  TempSrc: Oral Oral Oral Oral  Resp: 16 16 18 18   Height:      Weight:  44.9 kg (98 lb 15.8 oz)    SpO2: 95% 95% 95% 98%    Weight change: -1.2 kg (-2 lb 10.3 oz) Filed  Weights   01/29/15 0643 01/29/15 2159 01/30/15 2149  Weight: 45.178 kg (99 lb 9.6 oz) 46.1 kg (101 lb 10.1 oz) 44.9 kg (98 lb 15.8 oz)   Body mass index is 18.71 kg/(m^2).   Gen Exam: Awake and alert with clear speech.   Neck: Supple, No JVD.  Chest: B/L Clear.  No rales or rhonchi CVS: S1 S2 Regular, no murmurs.  Abdomen: soft, BS +, non tender, non distended.  Extremities: no edema, lower extremities warm to touch. Neurologic: Non Focal.   Skin: No Rash.   Wounds: N/A.   Intake/Output from previous day:  Intake/Output Summary (Last 24 hours) at 01/31/15 1111 Last data filed at 01/31/15 0600   Gross per 24 hour  Intake    440 ml  Output      0 ml  Net    440 ml     LAB RESULTS: CBC  Recent Labs Lab 01/28/15 2229 01/30/15 0651  WBC 12.4* 8.2  HGB 12.8 11.6*  HCT 38.9 34.7*  PLT 192 175  MCV 82.4 80.5  MCH 27.1 26.9  MCHC 32.9 33.4  RDW 14.6 14.6  LYMPHSABS 1.3  --   MONOABS 0.6  --   EOSABS 0.0  --   BASOSABS 0.0  --     Chemistries   Recent Labs Lab 01/28/15 2229 01/30/15 0651  NA 137 138  K 3.8 3.9  CL 102 109  CO2 25 22  GLUCOSE 141* 141*  BUN 14 6  CREATININE 0.72 0.50  CALCIUM 9.8 8.6    CBG:  Recent Labs Lab 01/28/15 2227 01/29/15 1704  GLUCAP 135* 155*    GFR Estimated Creatinine Clearance: 44.4 mL/min (by C-G formula based on Cr of 0.5).  Coagulation profile No results for input(s): INR, PROTIME in the last 168 hours.  Cardiac Enzymes No results for input(s): CKMB, TROPONINI, MYOGLOBIN in the last 168 hours.  Invalid input(s): CK  Invalid input(s): POCBNP No results for input(s): DDIMER in the last 72 hours. No results for input(s): HGBA1C in the last 72 hours. No results for input(s): CHOL, HDL, LDLCALC, TRIG, CHOLHDL, LDLDIRECT in the last 72 hours. No results for input(s): TSH, T4TOTAL, T3FREE, THYROIDAB in the last 72 hours.  Invalid input(s): FREET3 No results for input(s): VITAMINB12, FOLATE, FERRITIN, TIBC, IRON, RETICCTPCT in the last 72 hours. No results for input(s): LIPASE, AMYLASE in the last 72 hours.  Urine Studies No results for input(s): UHGB, CRYS in the last 72 hours.  Invalid input(s): UACOL, UAPR, USPG, UPH, UTP, UGL, UKET, UBIL, UNIT, UROB, ULEU, UEPI, UWBC, URBC, UBAC, CAST, UCOM, BILUA  MICROBIOLOGY: Recent Results (from the past 240 hour(s))  Urine culture     Status: None   Collection Time: 01/28/15 10:51 PM  Result Value Ref Range Status   Specimen Description URINE, RANDOM  Final   Special Requests NONE  Final   Colony Count   Final    7,000 COLONIES/ML Performed at [facility]    Culture   Final    INSIGNIFICANT GROWTH Performed at [facility]    Report Status 01/30/2015 FINAL  Final  CSF culture     Status: None (Preliminary result)   Collection Time: 01/29/15  1:25 AM  Result Value Ref Range Status   Specimen Description BACK  Final   Special Requests Normal  Final   Gram Stain   Final    NO WBC SEEN NO ORGANISMS SEEN CYTOSPIN Performed at [facility] Performed at  [facility]   Final    NO GROWTH 1 DAY Performed at [facility]    Report Status PENDING  Incomplete  Gram stain     Status: None   Collection Time: 01/29/15  1:25 AM  Result Value Ref Range Status   Specimen Description CSF  Final   Special Requests Normal  Final   Gram Stain CYTOSPUN NO ORGANISMS SEEN NO WBC SEEN   Final   Report Status 01/29/2015 FINAL  Final  Herpes simplex virus culture     Status: None   Collection Time: 01/29/15  1:25 AM  Result Value Ref Range Status   Specimen Description BACK  Final   Special Requests Normal  Final   Culture   Final    THIS TEST WAS ORDERED IN ERROR AND HAS BEEN CREDITED.   Report Status 01/31/2015 FINAL  Final  Culture, blood (routine x 2)     Status: None (Preliminary result)   Collection Time: 01/29/15  5:20 AM  Result Value Ref Range Status   Specimen Description BLOOD LEFT ARM  Final   Special Requests BOTTLES DRAWN AEROBIC AND ANAEROBIC 5CC EACH  Final   Culture   Final           BLOOD CULTURE RECEIVED NO GROWTH TO DATE CULTURE WILL BE HELD FOR 5 DAYS BEFORE ISSUING A FINAL NEGATIVE REPORT Performed at [facility]    Report Status PENDING  Incomplete  Culture, blood (routine x 2)     Status: None (Preliminary result)   Collection Time: 01/29/15  5:25 AM  Result Value Ref Range Status   Specimen Description BLOOD LEFT FOREARM  Final   Special Requests BOTTLES DRAWN AEROBIC AND ANAEROBIC 5CC EACH  Final   Culture   Final           BLOOD CULTURE RECEIVED NO  GROWTH TO DATE CULTURE WILL BE HELD FOR 5 DAYS BEFORE ISSUING A FINAL NEGATIVE REPORT Performed at [facility]    Report Status PENDING  Incomplete    RADIOLOGY STUDIES/RESULTS: Ct Abdomen Pelvis Wo Contrast  01/30/2015   CLINICAL DATA:  Abdominal pain.  Sepsis.  EXAM: CT ABDOMEN AND PELVIS WITHOUT CONTRAST  TECHNIQUE: Multidetector CT imaging of the abdomen and pelvis was performed following the standard protocol without IV contrast.  COMPARISON:  None.  FINDINGS: There are unremarkable unenhanced appearances of the liver, spleen, pancreas, adrenals and kidneys. There probably has been cholecystectomy and hysterectomy. There is no bile duct dilatation. There is no hydronephrosis or ureteral dilatation.  There is a small hiatal hernia. There is extensive colonic diverticulosis. There is no evidence of diverticulitis or other acute inflammatory process in the abdomen or pelvis. There is no bowel obstruction. There is no free air. There is no ascites.  No significant abnormalities are evident in the lower chest.  No relevant skeletal abnormalities are evident. 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  IMPRESSION: *No acute findings are evident in the abdomen or pelvis. Etiology of sepsis is not identified on this scan. *Uncomplicated diverticulosis *Small hiatal hernia   Electronically Signed   By: Andreas Newport M.D.   On: 01/30/2015 01:08   Dg Chest 2 View  01/30/2015   CLINICAL DATA:  Sepsis.  EXAM: CHEST  2 VIEW  COMPARISON:  01/28/2015  FINDINGS: Lungs are hyperinflated. The heart is mildly enlarged. No focal consolidations or pleural  effusions. Perihilar peribronchial thickening. No pulmonary edema.  IMPRESSION: 1. Bronchitic changes. 2. Cardiomegaly.   Electronically Signed   By: Nolon Nations M.D.   On: 01/30/2015 08:21   Dg Chest 2 View  01/28/2015   CLINICAL DATA:  Acute onset of confusion.  Initial encounter.  EXAM:  CHEST  2 VIEW  COMPARISON:  Chest radiograph performed 01/15/2010  FINDINGS: The lungs are well-aerated. Minimal left basilar opacity may reflect atelectasis or scarring. There is no evidence of pleural effusion or pneumothorax.  The heart is borderline normal in size. No acute osseous abnormalities are seen. Scattered calcification is seen along the descending thoracic aorta and proximal abdominal aorta.  IMPRESSION: Minimal left basilar opacity again may reflect atelectasis or scarring. Lungs otherwise clear.   Electronically Signed   By: Garald Balding M.D.   On: 01/28/2015 23:18   Ct Head Wo Contrast  01/29/2015   CLINICAL DATA:  Confusion beginning today, dizziness and syncope yesterday. History of hypertension diabetes.  EXAM: CT HEAD WITHOUT CONTRAST  TECHNIQUE: Contiguous axial images were obtained from the base of the skull through the vertex without intravenous contrast.  COMPARISON:  CT of the head June 16, 2005  FINDINGS: The ventricles and sulci are normal for age. No intraparenchymal hemorrhage, mass effect nor midline shift. Confluent supratentorial white matter hypodensities are within normal range for patient's age and though non-specific suggest sequelae of chronic small vessel ischemic disease. No acute large vascular territory infarcts. Chronic appearing RIGHT thalamic infarcts are new from prior study. Remote small LEFT cerebellar infarct.  No abnormal extra-axial fluid collections. Basal cisterns are patent. Moderate calcific atherosclerosis of the carotid siphons.  No skull fracture. The included ocular globes and orbital contents are non-suspicious. Status post bilateral ocular lens implants. Frothy secretions RIGHT posterior ethmoid air cell without air-fluid levels. The mastoid air cells are well aerated.  IMPRESSION: No acute intracranial process.  Moderate to severe white matter changes can be seen with chronic small vessel ischemic disease. 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  Acute ethmoid sinusitis.   Electronically Signed   By: Elon Alas   On: 01/29/2015 00:38    Oren Binet, MD  [facility] Pager:[phone]  If 7PM-7AM, please contact night-coverage [email or web address] Password [email or web address] 01/31/2015, 11:11 AM   LOS: 2 days

## 2015-01-31 NOTE — Evaluation (Signed)
Physical Therapy Evaluation Patient Details Name: GEAN LAURSEN MRN: [id number] DOB: 02-May-1941 Today's Date: 01/31/2015   History of Present Illness  Pt is a 74 y.o. female with a history of HTN, DM2, and Osteoporosis who was brought to the ED after she began to have fevers and chills and confusion at home.Her temperature was 103.1. In the ED she was evaluated and a Sepsis Workup and LP was performed. She was found to have a minimal left basilar opacity on chest X-ray; she was in the hospital at [facility] for Cataract surgery on 01/11, and on 01/13/2015.   Clinical Impression  Pt admitted with above diagnosis. Pt currently with functional limitations due to the deficits listed below (see PT Problem List). At the time of PT eval pt was able to perform transfers and ambulation with mod I. Will keep on PT caseload for further gait training as pt was unsteady at times. Pt will benefit from skilled PT to increase their independence and safety with mobility to allow discharge to the venue listed below. Do not anticipate that pt will require any therapy follow up at d/c.      Follow Up Recommendations No PT follow up    Equipment Recommendations  None recommended by PT    Recommendations for Other Services       Precautions / Restrictions Precautions Precautions: Fall Restrictions Weight Bearing Restrictions: No      Mobility  Bed Mobility Overal bed mobility: Modified Independent Bed Mobility: Supine to Sit           General bed mobility comments: No assist necessary with HOB flat  Transfers Overall transfer level: Modified independent Equipment used: None             General transfer comment: No unsteadiness or LOB noted.   Ambulation/Gait Ambulation/Gait assistance: Supervision Ambulation Distance (Feet): 340 Feet Assistive device: None Gait Pattern/deviations: Step-through pattern;Decreased stride length;Trunk flexed Gait velocity: Decreased slightly Gait  velocity interpretation: Below normal speed for age/gender General Gait Details: Pt ambulating well without assistance. As pt fatigued, appeared somewhat unsteady at times, stepping to the side to regain balance. No assist was required, however supervision for safety was provided. Pt reports she does not have any balance deficits at baseline.   Stairs            Wheelchair Mobility    Modified Rankin (Stroke Patients Only)       Balance Overall balance assessment: Needs assistance Sitting-balance support: Feet supported;No upper extremity supported Sitting balance-Leahy Scale: Good     Standing balance support: No upper extremity supported Standing balance-Leahy Scale: Fair                               Pertinent Vitals/Pain Pain Assessment: No/denies pain    Home Living Family/patient expects to be discharged to:: Private residence Living Arrangements: Spouse/significant other Available Help at Discharge: Family;Available 24 hours/day Type of Home: House Home Access: Level entry     Home Layout: One level Home Equipment: [place] - 2 wheels      Prior Function Level of Independence: Independent         Comments: Still drives, independent with IADL's     Hand Dominance   Dominant Hand: Right    Extremity/Trunk Assessment   Upper Extremity Assessment: Defer to OT evaluation           Lower Extremity Assessment: Overall WFL for tasks assessed  Cervical / Trunk Assessment: Kyphotic  Communication   Communication: No difficulties  Cognition Arousal/Alertness: Awake/alert Behavior During Therapy: WFL for tasks assessed/performed Overall Cognitive Status: Within Functional Limits for tasks assessed                      General Comments      Exercises        Assessment/Plan    PT Assessment Patient needs continued PT services  PT Diagnosis Difficulty walking   PT Problem List Decreased strength;Decreased range of  motion;Decreased activity tolerance;Decreased balance;Decreased mobility;Decreased knowledge of use of DME;Decreased safety awareness;Decreased knowledge of precautions  PT Treatment Interventions DME instruction;Gait training;Functional mobility training;Stair training;Therapeutic activities;Therapeutic exercise;Neuromuscular re-education;Patient/family education   PT Goals (Current goals can be found in the Care Plan section) Acute Rehab PT Goals Patient Stated Goal: Home asap PT Goal Formulation: With patient Time For Goal Achievement: 02/07/15 Potential to Achieve Goals: Good    Frequency Min 3X/week   Barriers to discharge        Co-evaluation               End of Session Equipment Utilized During Treatment: Gait belt Activity Tolerance: Patient tolerated treatment well Patient left: in chair;with chair alarm set;with call bell/phone within reach Nurse Communication: Mobility status         Time: 0803-0830 PT Time Calculation (min) (ACUTE ONLY): 27 min   Charges:   PT Evaluation $Initial PT Evaluation Tier I: 1 Procedure PT Treatments $Gait Training: 8-22 mins   PT G Codes:        Rolinda Roan 02-19-2015, 8:48 AM   Rolinda Roan, PT, DPT Acute Rehabilitation Services Pager: [phone]

## 2015-01-31 NOTE — Progress Notes (Signed)
MD wrote order to schedule follow up appointment with PCP in 1 week. Pt stated she did not want to make appointment today; she has to check her schedule. Will follow up with pt.   Carole Civil, RN

## 2015-01-31 NOTE — Progress Notes (Signed)
Utilization review complete. Dymphna Wadley RN CCM Case Mgmt phone [phone] 

## 2015-02-01 DIAGNOSIS — J189 Pneumonia, unspecified organism: Secondary | ICD-10-CM

## 2015-02-01 LAB — CSF CULTURE W GRAM STAIN: Culture: NO GROWTH

## 2015-02-01 LAB — CSF CULTURE
GRAM STAIN: NONE SEEN
SPECIAL REQUESTS: NORMAL

## 2015-02-01 LAB — HERPES SIMPLEX VIRUS(HSV) DNA BY PCR
HSV 1 DNA: NOT DETECTED
HSV 2 DNA: NOT DETECTED

## 2015-02-01 MED ORDER — LEVOFLOXACIN 500 MG PO TABS: 500.0000 mg | ORAL_TABLET | Freq: Every day | ORAL | Status: DC

## 2015-02-01 NOTE — Progress Notes (Signed)
Pt provided with discharge information including instruction on follow up appointments, medications, activity levels, and diet recommendations. Pt verbalized understanding of all information. IV was dc'd without complication. VSS. Pt wheeled out via volunteer.   Tyna Jaksch, RN

## 2015-02-01 NOTE — Discharge Summary (Signed)
PATIENT DETAILS Name: Laura Melendez Age: 74 y.o. Sex: female Date of Birth: May 21, 1941 MRN: [id number]. Admitting Physician: Theressa Millard, MD GUY:[id number],QVZDG, MD  Admit Date: 01/28/2015 Discharge date: 02/01/2015  Recommendations for Outpatient Follow-up:  1. Please follow Blood/CSF culture till final 2. Repeat CBC and BMET at next visit.  PRIMARY DISCHARGE DIAGNOSIS:  Principal Problem:   Sepsis Active Problems:   Essential hypertension, benign   DM (diabetes mellitus)   Dyslipidemia   Tobacco abuse   HCAP (healthcare-associated pneumonia)      PAST MEDICAL HISTORY: Past Medical History  Diagnosis Date  . Essential hypertension, benign   . Type 2 diabetes mellitus   . Osteoporosis 06/2008    DISCHARGE MEDICATIONS: Current Discharge Medication List    START taking these medications   Details  levofloxacin (LEVAQUIN) 500 MG tablet Take 1 tablet (500 mg total) by mouth daily. Qty: 3 tablet, Refills: 0      CONTINUE these medications which have NOT CHANGED   Details  alendronate (FOSAMAX) 70 MG tablet Take 1 tablet (70 mg total) by mouth every 7 (seven) days. Qty: 12 tablet, Refills: 3    aspirin EC 81 MG tablet Take 81 mg by mouth daily.    Bromfenac Sodium 0.07 % SOLN Place 1 drop into the left eye daily. 1 drop, left eye, To be used until 02-09-2015    Calcium Carb-Cholecalciferol (CALCIUM 600/VITAMIN D3) 600-800 MG-UNIT TABS Take 1 tablet by mouth 2 (two) times daily with a meal.    Cholecalciferol (VITAMIN D3) 400 UNITS CAPS Take 1 capsule by mouth 3 (three) times daily.    CINNAMON PO Take 1,000 mg by mouth daily.    CRANBERRY PO Take 84 mg by mouth daily.    Difluprednate 0.05 % EMUL Place 1 drop into the left eye 3 (three) times daily. 1 drop, left eye, TID, To be used until 02-09-2015    lisinopril (PRINIVIL,ZESTRIL) 2.5 MG tablet Take 1 tablet (2.5 mg total) by mouth daily. Qty: 90 tablet, Refills: 3    metFORMIN (GLUCOPHAGE) 500 MG  tablet Take 1 tablet (500 mg total) by mouth 2 (two) times daily with a meal. Qty: 180 tablet, Refills: 3    metoprolol (LOPRESSOR) 50 MG tablet Take 1 tablet (50 mg total) by mouth 2 (two) times daily. Qty: 180 tablet, Refills: 3    Multiple Vitamin (MULTIVITAMIN WITH MINERALS) TABS Take 1 tablet by mouth daily.    naproxen sodium (ANAPROX) 220 MG tablet Take 220 mg by mouth daily as needed (pain).    NIFEdipine (PROCARDIA XL/ADALAT-CC) 60 MG 24 hr tablet Take 1 tablet (60 mg total) by mouth daily. Qty: 90 tablet, Refills: 3    Omega-3 Fatty Acids (FISH OIL CONCENTRATE) 300 MG CAPS Take 1 capsule by mouth daily.    raloxifene (EVISTA) 60 MG tablet Take 1 tablet (60 mg total) by mouth daily. Qty: 90 tablet, Refills: 3        ALLERGIES:  No Known Allergies  BRIEF HPI:  See H&P, Labs, Consult and Test reports for all details in brief, patient was admitted is a 74 y.o. female with a history of HTN, DM2, And Osteoporosis admitted to the hospital for evaluation of fever and confusion.  CONSULTATIONS:   None  PERTINENT RADIOLOGIC STUDIES: Ct Abdomen Pelvis Wo Contrast  01/30/2015   CLINICAL DATA:  Abdominal pain.  Sepsis.  EXAM: CT ABDOMEN AND PELVIS WITHOUT CONTRAST  TECHNIQUE: Multidetector CT imaging of the abdomen and pelvis was performed following  the standard protocol without IV contrast.  COMPARISON:  None.  FINDINGS: There are unremarkable unenhanced appearances of the liver, spleen, pancreas, adrenals and kidneys. There probably has been cholecystectomy and hysterectomy. There is no bile duct dilatation. There is no hydronephrosis or ureteral dilatation.  There is a small hiatal hernia. There is extensive colonic diverticulosis. There is no evidence of diverticulitis or other acute inflammatory process in the abdomen or pelvis. There is no bowel obstruction. There is no free air. There is no ascites.  No significant abnormalities are evident in the lower chest.  No relevant  skeletal abnormalities are evident. 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  IMPRESSION: *No acute findings are evident in the abdomen or pelvis. Etiology of sepsis is not identified on this scan. *Uncomplicated diverticulosis *Small hiatal hernia   Electronically Signed   By: Andreas Newport M.D.   On: 01/30/2015 01:08   Dg Chest 2 View  01/30/2015   CLINICAL DATA:  Sepsis.  EXAM: CHEST  2 VIEW  COMPARISON:  01/28/2015  FINDINGS: Lungs are hyperinflated. The heart is mildly enlarged. No focal consolidations or pleural effusions. Perihilar peribronchial thickening. No pulmonary edema.  IMPRESSION: 1. Bronchitic changes. 2. Cardiomegaly.   Electronically Signed   By: Nolon Nations M.D.   On: 01/30/2015 08:21   Dg Chest 2 View  01/28/2015   CLINICAL DATA:  Acute onset of confusion.  Initial encounter.  EXAM: CHEST  2 VIEW  COMPARISON:  Chest radiograph performed 01/15/2010  FINDINGS: The lungs are well-aerated. Minimal left basilar opacity may reflect atelectasis or scarring. There is no evidence of pleural effusion or pneumothorax.  The heart is borderline normal in size. No acute osseous abnormalities are seen. Scattered calcification is seen along the descending thoracic aorta and proximal abdominal aorta.  IMPRESSION: Minimal left basilar opacity again may reflect atelectasis or scarring. Lungs otherwise clear.   Electronically Signed   By: Garald Balding M.D.   On: 01/28/2015 23:18   Ct Head Wo Contrast  01/29/2015   CLINICAL DATA:  Confusion beginning today, dizziness and syncope yesterday. History of hypertension diabetes.  EXAM: CT HEAD WITHOUT CONTRAST  TECHNIQUE: Contiguous axial images were obtained from the base of the skull through the vertex without intravenous contrast.  COMPARISON:  CT of the head June 16, 2005  FINDINGS: The ventricles and sulci are normal for age. No intraparenchymal hemorrhage, mass effect nor midline  shift. Confluent supratentorial white matter hypodensities are within normal range for patient's age and though non-specific suggest sequelae of chronic small vessel ischemic disease. No acute large vascular territory infarcts. Chronic appearing RIGHT thalamic infarcts are new from prior study. Remote small LEFT cerebellar infarct.  No abnormal extra-axial fluid collections. Basal cisterns are patent. Moderate calcific atherosclerosis of the carotid siphons.  No skull fracture. The included ocular globes and orbital contents are non-suspicious. Status post bilateral ocular lens implants. Frothy secretions RIGHT posterior ethmoid air cell without air-fluid levels. The mastoid air cells are well aerated.  IMPRESSION: No acute intracranial process.  Moderate to severe white matter changes can be seen with chronic small vessel ischemic disease. 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  Acute ethmoid sinusitis.   Electronically Signed   By: Elon Alas   On: 01/29/2015 00:38     PERTINENT LAB RESULTS: CBC:  Recent Labs  01/30/15 0651  WBC 8.2  HGB 11.6*  HCT 34.7*  PLT  175   CMET CMP     Component Value Date/Time   NA 138 01/30/2015 0651   K 3.9 01/30/2015 0651   CL 109 01/30/2015 0651   CO2 22 01/30/2015 0651   GLUCOSE 141* 01/30/2015 0651   BUN 6 01/30/2015 0651   CREATININE 0.50 01/30/2015 0651   CREATININE 0.66 09/06/2014 0925   CALCIUM 8.6 01/30/2015 0651   PROT 7.1 01/28/2015 2229   ALBUMIN 4.1 01/28/2015 2229   AST 24 01/28/2015 2229   ALT 17 01/28/2015 2229   ALKPHOS 44 01/28/2015 2229   BILITOT 0.9 01/28/2015 2229   GFRNONAA >90 01/30/2015 0651   GFRAA >90 01/30/2015 0651    GFR Estimated Creatinine Clearance: 44.2 mL/min (by C-G formula based on Cr of 0.5). No results for input(s): LIPASE, AMYLASE in the last 72 hours. No results for input(s): CKTOTAL, CKMB, CKMBINDEX, TROPONINI in the last 72 hours. Invalid input(s): POCBNP No  results for input(s): DDIMER in the last 72 hours. No results for input(s): HGBA1C in the last 72 hours. No results for input(s): CHOL, HDL, LDLCALC, TRIG, CHOLHDL, LDLDIRECT in the last 72 hours. No results for input(s): TSH, T4TOTAL, T3FREE, THYROIDAB in the last 72 hours.  Invalid input(s): FREET3 No results for input(s): VITAMINB12, FOLATE, FERRITIN, TIBC, IRON, RETICCTPCT in the last 72 hours. Coags: No results for input(s): INR in the last 72 hours.  Invalid input(s): PT Microbiology: Recent Results (from the past 240 hour(s))  Urine culture     Status: None   Collection Time: 01/28/15 10:51 PM  Result Value Ref Range Status   Specimen Description URINE, RANDOM  Final   Special Requests NONE  Final   Colony Count   Final    7,000 COLONIES/ML Performed at [facility]    Culture   Final    INSIGNIFICANT GROWTH Performed at [facility]    Report Status 01/30/2015 FINAL  Final  CSF culture     Status: None   Collection Time: 01/29/15  1:25 AM  Result Value Ref Range Status   Specimen Description BACK  Final   Special Requests Normal  Final   Gram Stain   Final    NO WBC SEEN NO ORGANISMS SEEN CYTOSPIN Performed at [facility] Performed at [facility]    Culture   Final    NO GROWTH 3 DAYS Performed at [facility]    Report Status 02/01/2015 FINAL  Final  Gram stain     Status: None   Collection Time: 01/29/15  1:25 AM  Result Value Ref Range Status   Specimen Description CSF  Final   Special Requests Normal  Final   Gram Stain CYTOSPUN NO ORGANISMS SEEN NO WBC SEEN   Final   Report Status 01/29/2015 FINAL  Final  Herpes simplex virus culture     Status: None   Collection Time: 01/29/15  1:25 AM  Result Value Ref Range Status   Specimen Description BACK  Final   Special Requests Normal  Final   Culture   Final    THIS TEST WAS ORDERED IN ERROR AND HAS BEEN CREDITED.   Report Status 01/31/2015 FINAL  Final    Culture, blood (routine x 2)     Status: None (Preliminary result)   Collection Time: 01/29/15  5:20 AM  Result Value Ref Range Status   Specimen Description BLOOD LEFT ARM  Final   Special Requests BOTTLES DRAWN AEROBIC AND ANAEROBIC [facility] EACH  Final  Culture   Final           BLOOD CULTURE RECEIVED NO GROWTH TO DATE CULTURE WILL BE HELD FOR 5 DAYS BEFORE ISSUING A FINAL NEGATIVE REPORT Performed at [facility]    Report Status PENDING  Incomplete  Culture, blood (routine x 2)     Status: None (Preliminary result)   Collection Time: 01/29/15  5:25 AM  Result Value Ref Range Status   Specimen Description BLOOD LEFT FOREARM  Final   Special Requests BOTTLES DRAWN AEROBIC AND ANAEROBIC 5CC EACH  Final   Culture   Final           BLOOD CULTURE RECEIVED NO GROWTH TO DATE CULTURE WILL BE HELD FOR 5 DAYS BEFORE ISSUING A FINAL NEGATIVE REPORT Performed at [facility]    Report Status PENDING  Incomplete     BRIEF HOSPITAL COURSE:  Sepsis: Foci not evident. Chest x-ray and UA negative. Blood and urine cultures negative so far. LP done on admission, CSF not consistent with meningitis. Empirically started on vancomycin and cefepime. Improved significantly, with complete resolution of sepsis pathophysiology. All blood and CSF cultures are negative to date. Since patient rapidly improved she has been requesting to be discharged home. She is afebrile and without any leukocytosis. She has a completely nontoxic appearance. She is ambulating in the hallway. We will transition to Levaquin for 3 more days to complete a seven-day course of empiric antibiotics. I asked her to follow-up with her PCP in one week, she will need final CSF and blood cultures follow up at PCPs office.   Active Problems:  Acute encephalopathy: Likely secondary to above, CT head negative. CSF not consistent with meningoencephalitis.   Essential hypertension, benign: Blood pressure initially held because of  soft blood pressure on admission, we'll resume all antihypertensive medications on discharge   DM (diabetes mellitus): Metformin was on hold, CBGs controlledwith SSI. Resume metformin on discharge   Tobacco abuse: Counseled extensively.   TODAY-DAY OF DISCHARGE:  Subjective:   Laura Melendez today has no headache,no chest abdominal pain,no new weakness tingling or numbness, feels much better wants to go home today.   Objective:   Blood pressure 148/71, pulse 59, temperature 98.3 F (36.8 C), temperature source Axillary, resp. rate 16, height 5\' 1"  (1.549 m), weight 44.7 kg (98 lb 8.7 oz), SpO2 94 %.  Intake/Output Summary (Last 24 hours) at 02/01/15 0915 Last data filed at 01/31/15 1700  Gross per 24 hour  Intake    480 ml  Output      0 ml  Net    480 ml   Filed Weights   01/29/15 2159 01/30/15 2149 01/31/15 2141  Weight: 46.1 kg (101 lb 10.1 oz) 44.9 kg (98 lb 15.8 oz) 44.7 kg (98 lb 8.7 oz)    Exam Awake Alert, Oriented *3, No new F.N deficits, Normal affect [place].AT,PERRAL Supple Neck,No JVD, No cervical lymphadenopathy appriciated.  Symmetrical Chest wall movement, Good air movement bilaterally, CTAB RRR,No Gallops,Rubs or new Murmurs, No Parasternal Heave +ve B.Sounds, Abd Soft, Non tender, No organomegaly appriciated, No rebound -guarding or rigidity. No Cyanosis, Clubbing or edema, No new Rash or bruise  DISCHARGE CONDITION: Stable  DISPOSITION: Home  DISCHARGE INSTRUCTIONS:    Activity:  As tolerated   Diet recommendation: Diabetic Diet Heart Healthy diet  Discharge Instructions    Call MD for:  difficulty breathing, headache or visual disturbances    Complete by:  As directed  Call MD for:  temperature >100.4    Complete by:  As directed      Diet - low sodium heart healthy    Complete by:  As directed      Diet Carb Modified    Complete by:  As directed      Increase activity slowly    Complete by:  As directed            Follow-up  Information    Follow up with LUKING,SCOTT, MD. Schedule an appointment as soon as possible for a visit in 1 week.   Specialty:  Family Medicine   Contact information:   [place] [address] [phone]       Total Time spent on discharge equals 45 minutes.  SignedOren Binet 02/01/2015 9:15 AM

## 2015-02-02 NOTE — Progress Notes (Signed)
CARE MANAGEMENT NOTE 02/02/2015  Patient:  Laura Melendez, Laura Melendez   Account Number:  [id number]  Date Initiated:  01/31/2015  Documentation initiated by:  [facility]  Subjective/Objective Assessment:   PNA     Action/Plan:   Anticipated DC Date:  02/01/2015   Anticipated DC Plan:  [place]  CM consult      Choice offered to / List presented to:             Status of service:  Completed, signed off Medicare Important Message given?  YES (If response is "NO", the following Medicare IM given date fields will be blank) Date Medicare IM given:  01/31/2015 Medicare IM given by:  [facility] Date Additional Medicare IM given:   Additional Medicare IM given by:    Discharge Disposition:  HOME/SELF CARE  Per UR Regulation:  Reviewed for med. necessity/level of care/duration of stay  If discussed at [place] of Stay Meetings, dates discussed:    Comments:  late entry  02/01/2015 1100 Chart reviewed and no NCM needs identified. Jonnie Finner RN CCM Case Mgmt phone [phone]

## 2015-02-04 LAB — CULTURE, BLOOD (ROUTINE X 2)
CULTURE: NO GROWTH
Culture: NO GROWTH

## 2015-02-14 DIAGNOSIS — R509 Fever, unspecified: Secondary | ICD-10-CM | POA: Insufficient documentation

## 2015-02-15 ENCOUNTER — Encounter: Payer: Self-pay | Admitting: Family Medicine

## 2015-02-15 ENCOUNTER — Ambulatory Visit (INDEPENDENT_AMBULATORY_CARE_PROVIDER_SITE_OTHER): Payer: Medicare HMO | Admitting: Family Medicine

## 2015-02-15 VITALS — BP 132/76 | Temp 98.3°F | Ht 61.0 in | Wt 101.0 lb

## 2015-02-15 DIAGNOSIS — E119 Type 2 diabetes mellitus without complications: Secondary | ICD-10-CM | POA: Diagnosis not present

## 2015-02-15 DIAGNOSIS — E785 Hyperlipidemia, unspecified: Secondary | ICD-10-CM

## 2015-02-15 DIAGNOSIS — I10 Essential (primary) hypertension: Secondary | ICD-10-CM

## 2015-02-15 DIAGNOSIS — I63239 Cerebral infarction due to unspecified occlusion or stenosis of unspecified carotid arteries: Secondary | ICD-10-CM

## 2015-02-15 DIAGNOSIS — I639 Cerebral infarction, unspecified: Secondary | ICD-10-CM | POA: Insufficient documentation

## 2015-02-15 LAB — POCT GLYCOSYLATED HEMOGLOBIN (HGB A1C): Hemoglobin A1C: 6.7

## 2015-02-15 NOTE — Progress Notes (Signed)
   Subjective:    Patient ID: Laura Melendez, female    DOB: 1941/06/03, 74 y.o.   MRN: [id number]  HPIFollow up hospital stay. Was confused and had high fever. Finished antibiotics.  Pt states she is feeling much better. No concerns today.  A1C today 6.7. Pt states she has not been checking blood sugars at home.   Hospital notes were reviewed Culture final results were reviewed for urine blood and spinal fluid CT was reviewed with family Recommendation of a carotid ultrasound was discussed with family Importance of staying away from smoking and keeping risk factors under good control was discussed. Review of Systems  Constitutional: Negative for activity change, appetite change and fatigue.  HENT: Negative for congestion.   Respiratory: Negative for cough.   Cardiovascular: Negative for chest pain.  Gastrointestinal: Negative for abdominal pain.  Endocrine: Negative for polydipsia and polyphagia.  Neurological: Negative for weakness.  Psychiatric/Behavioral: Negative for confusion.       Objective:   Physical Exam  Lungs clear heart regular no unilateral weakness noted extremities no edema skin warm dry HEENT benign Even though this was under the transitional care aspect her visit falls just inside the 14 day parameter. I did assess her home care needs her husband is caring for her and she does not have any need for home health physical therapy occupational therapy. Additional testing was set up. Results of the testing from the hospitalization was followed up on.     Assessment & Plan:  Diabetes decent control she is taking her medication she is try to watch her diet she is trying to become more active  She recently quit smoking which is great I encouraged her to stay away from smoking  No recent infection since being out of the hospital no source was then a 5 blood cultures urine culture and spinal fluid culture all negative. I did review over hospital findings with the  family  Underlying issues with silent stroke will need carotid ultrasound and check lipid as well.  To take 81 mg aspirin.

## 2015-02-21 ENCOUNTER — Ambulatory Visit (HOSPITAL_COMMUNITY)
Admission: RE | Admit: 2015-02-21 | Discharge: 2015-02-21 | Disposition: A | Discharge status: Home / Self Care | Payer: Medicare HMO | Source: Ambulatory Visit | Attending: Family Medicine | Admitting: Family Medicine

## 2015-02-21 DIAGNOSIS — I63239 Cerebral infarction due to unspecified occlusion or stenosis of unspecified carotid arteries: Secondary | ICD-10-CM | POA: Insufficient documentation

## 2015-02-22 ENCOUNTER — Other Ambulatory Visit: Payer: Self-pay

## 2015-02-22 LAB — LIPID PANEL
CHOL/HDL RATIO: 4.1 ratio (ref 0.0–4.4)
Cholesterol, Total: 211 mg/dL — ABNORMAL HIGH (ref 100–199)
HDL: 52 mg/dL (ref >39)
LDL CALC: 124 mg/dL — AB (ref 0–99)
Triglycerides: 175 mg/dL — ABNORMAL HIGH (ref 0–149)
VLDL Cholesterol Cal: 35 mg/dL (ref 5–40)

## 2015-02-22 LAB — BASIC METABOLIC PANEL
BUN/Creatinine Ratio: 24 (ref 11–26)
BUN: 16 mg/dL (ref 8–27)
CO2: 21 mmol/L (ref 18–29)
Calcium: 10.4 mg/dL — ABNORMAL HIGH (ref 8.7–10.3)
Chloride: 100 mmol/L (ref 97–108)
Creatinine, Ser: 0.66 mg/dL (ref 0.57–1.00)
GFR calc Af Amer: 101 mL/min/{1.73_m2} (ref >59)
GFR, EST NON AFRICAN AMERICAN: 88 mL/min/{1.73_m2} (ref >59)
Glucose: 130 mg/dL — ABNORMAL HIGH (ref 65–99)
POTASSIUM: 4.4 mmol/L (ref 3.5–5.2)
SODIUM: 142 mmol/L (ref 134–144)

## 2015-02-22 MED ORDER — PRAVASTATIN SODIUM 20 MG PO TABS: 20.0000 mg | ORAL_TABLET | Freq: Every day | ORAL | Status: DC

## 2015-03-18 DIAGNOSIS — I639 Cerebral infarction, unspecified: Secondary | ICD-10-CM

## 2015-03-18 HISTORY — DX: Cerebral infarction, unspecified: I63.9

## 2015-03-30 ENCOUNTER — Encounter (HOSPITAL_COMMUNITY): Payer: Self-pay | Admitting: Emergency Medicine

## 2015-03-30 ENCOUNTER — Inpatient Hospital Stay (HOSPITAL_COMMUNITY): Payer: Medicare HMO

## 2015-03-30 ENCOUNTER — Emergency Department (HOSPITAL_COMMUNITY): Payer: Medicare HMO

## 2015-03-30 ENCOUNTER — Inpatient Hospital Stay (HOSPITAL_COMMUNITY)
Admission: EM | Admit: 2015-03-30 | Discharge: 2015-04-02 | DRG: 065 | Payer: Medicare HMO | Attending: Internal Medicine | Admitting: Internal Medicine

## 2015-03-30 DIAGNOSIS — I6789 Other cerebrovascular disease: Secondary | ICD-10-CM | POA: Diagnosis not present

## 2015-03-30 DIAGNOSIS — I6340 Cerebral infarction due to embolism of unspecified cerebral artery: Principal | ICD-10-CM | POA: Diagnosis present

## 2015-03-30 DIAGNOSIS — I63422 Cerebral infarction due to embolism of left anterior cerebral artery: Secondary | ICD-10-CM | POA: Diagnosis not present

## 2015-03-30 DIAGNOSIS — I240 Acute coronary thrombosis not resulting in myocardial infarction: Secondary | ICD-10-CM | POA: Diagnosis present

## 2015-03-30 DIAGNOSIS — Z7982 Long term (current) use of aspirin: Secondary | ICD-10-CM | POA: Diagnosis not present

## 2015-03-30 DIAGNOSIS — I10 Essential (primary) hypertension: Secondary | ICD-10-CM | POA: Diagnosis present

## 2015-03-30 DIAGNOSIS — I639 Cerebral infarction, unspecified: Secondary | ICD-10-CM | POA: Diagnosis not present

## 2015-03-30 DIAGNOSIS — I7410 Embolism and thrombosis of unspecified parts of aorta: Secondary | ICD-10-CM

## 2015-03-30 DIAGNOSIS — G8191 Hemiplegia, unspecified affecting right dominant side: Secondary | ICD-10-CM | POA: Diagnosis present

## 2015-03-30 DIAGNOSIS — E785 Hyperlipidemia, unspecified: Secondary | ICD-10-CM | POA: Diagnosis not present

## 2015-03-30 DIAGNOSIS — Z90710 Acquired absence of both cervix and uterus: Secondary | ICD-10-CM

## 2015-03-30 DIAGNOSIS — I6349 Cerebral infarction due to embolism of other cerebral artery: Secondary | ICD-10-CM | POA: Diagnosis not present

## 2015-03-30 DIAGNOSIS — Z9842 Cataract extraction status, left eye: Secondary | ICD-10-CM | POA: Diagnosis not present

## 2015-03-30 DIAGNOSIS — I63412 Cerebral infarction due to embolism of left middle cerebral artery: Secondary | ICD-10-CM | POA: Diagnosis not present

## 2015-03-30 DIAGNOSIS — R1314 Dysphagia, pharyngoesophageal phase: Secondary | ICD-10-CM | POA: Diagnosis not present

## 2015-03-30 DIAGNOSIS — I700 Atherosclerosis of aorta: Secondary | ICD-10-CM | POA: Diagnosis present

## 2015-03-30 DIAGNOSIS — Z720 Tobacco use: Secondary | ICD-10-CM

## 2015-03-30 DIAGNOSIS — Z961 Presence of intraocular lens: Secondary | ICD-10-CM | POA: Diagnosis present

## 2015-03-30 DIAGNOSIS — Z87891 Personal history of nicotine dependence: Secondary | ICD-10-CM

## 2015-03-30 DIAGNOSIS — M810 Age-related osteoporosis without current pathological fracture: Secondary | ICD-10-CM | POA: Diagnosis present

## 2015-03-30 DIAGNOSIS — E119 Type 2 diabetes mellitus without complications: Secondary | ICD-10-CM | POA: Diagnosis present

## 2015-03-30 DIAGNOSIS — I69351 Hemiplegia and hemiparesis following cerebral infarction affecting right dominant side: Secondary | ICD-10-CM | POA: Diagnosis not present

## 2015-03-30 DIAGNOSIS — E1159 Type 2 diabetes mellitus with other circulatory complications: Secondary | ICD-10-CM | POA: Diagnosis not present

## 2015-03-30 DIAGNOSIS — Z9841 Cataract extraction status, right eye: Secondary | ICD-10-CM

## 2015-03-30 DIAGNOSIS — E118 Type 2 diabetes mellitus with unspecified complications: Secondary | ICD-10-CM

## 2015-03-30 DIAGNOSIS — Z9049 Acquired absence of other specified parts of digestive tract: Secondary | ICD-10-CM | POA: Diagnosis present

## 2015-03-30 DIAGNOSIS — I7411 Embolism and thrombosis of thoracic aorta: Secondary | ICD-10-CM | POA: Diagnosis not present

## 2015-03-30 DIAGNOSIS — F329 Major depressive disorder, single episode, unspecified: Secondary | ICD-10-CM | POA: Diagnosis present

## 2015-03-30 DIAGNOSIS — R1311 Dysphagia, oral phase: Secondary | ICD-10-CM | POA: Diagnosis not present

## 2015-03-30 DIAGNOSIS — R531 Weakness: Secondary | ICD-10-CM | POA: Diagnosis present

## 2015-03-30 DIAGNOSIS — B370 Candidal stomatitis: Secondary | ICD-10-CM | POA: Diagnosis not present

## 2015-03-30 HISTORY — DX: Cerebral infarction, unspecified: I63.9

## 2015-03-30 LAB — COMPREHENSIVE METABOLIC PANEL
ALBUMIN: 4 g/dL (ref 3.5–5.2)
ALK PHOS: 40 U/L (ref 39–117)
ALT: 16 U/L (ref 0–35)
ANION GAP: 11 (ref 5–15)
AST: 22 U/L (ref 0–37)
BUN: 13 mg/dL (ref 6–23)
CO2: 26 mmol/L (ref 19–32)
CREATININE: 0.58 mg/dL (ref 0.50–1.10)
Calcium: 9.7 mg/dL (ref 8.4–10.5)
Chloride: 105 mmol/L (ref 96–112)
GFR, EST NON AFRICAN AMERICAN: 89 mL/min — AB (ref >90)
Glucose, Bld: 133 mg/dL — ABNORMAL HIGH (ref 70–99)
Potassium: 3.9 mmol/L (ref 3.5–5.1)
Sodium: 142 mmol/L (ref 135–145)
TOTAL PROTEIN: 7.4 g/dL (ref 6.0–8.3)
Total Bilirubin: 0.5 mg/dL (ref 0.3–1.2)

## 2015-03-30 LAB — I-STAT CHEM 8, ED
BUN: 15 mg/dL (ref 6–23)
CREATININE: 0.6 mg/dL (ref 0.50–1.10)
Calcium, Ion: 1.12 mmol/L — ABNORMAL LOW (ref 1.13–1.30)
Chloride: 105 mmol/L (ref 96–112)
Glucose, Bld: 132 mg/dL — ABNORMAL HIGH (ref 70–99)
HEMATOCRIT: 43 % (ref 36.0–46.0)
HEMOGLOBIN: 14.6 g/dL (ref 12.0–15.0)
POTASSIUM: 3.9 mmol/L (ref 3.5–5.1)
Sodium: 142 mmol/L (ref 135–145)
TCO2: 21 mmol/L (ref 0–100)

## 2015-03-30 LAB — I-STAT TROPONIN, ED: TROPONIN I, POC: 0 ng/mL (ref 0.00–0.08)

## 2015-03-30 LAB — APTT: APTT: 30 s (ref 24–37)

## 2015-03-30 LAB — CBC
HCT: 40.9 % (ref 36.0–46.0)
HEMOGLOBIN: 13.4 g/dL (ref 12.0–15.0)
MCH: 26.7 pg (ref 26.0–34.0)
MCHC: 32.8 g/dL (ref 30.0–36.0)
MCV: 81.5 fL (ref 78.0–100.0)
PLATELETS: 204 10*3/uL (ref 150–400)
RBC: 5.02 MIL/uL (ref 3.87–5.11)
RDW: 14.4 % (ref 11.5–15.5)
WBC: 6.2 10*3/uL (ref 4.0–10.5)

## 2015-03-30 LAB — RAPID URINE DRUG SCREEN, HOSP PERFORMED
AMPHETAMINES: NOT DETECTED
Barbiturates: NOT DETECTED
Benzodiazepines: NOT DETECTED
Cocaine: NOT DETECTED
Opiates: NOT DETECTED
Tetrahydrocannabinol: NOT DETECTED

## 2015-03-30 LAB — DIFFERENTIAL
BASOS PCT: 1 % (ref 0–1)
Basophils Absolute: 0.1 10*3/uL (ref 0.0–0.1)
EOS ABS: 0.1 10*3/uL (ref 0.0–0.7)
Eosinophils Relative: 1 % (ref 0–5)
Lymphocytes Relative: 33 % (ref 12–46)
Lymphs Abs: 2.1 10*3/uL (ref 0.7–4.0)
Monocytes Absolute: 0.4 10*3/uL (ref 0.1–1.0)
Monocytes Relative: 7 % (ref 3–12)
NEUTROS ABS: 3.6 10*3/uL (ref 1.7–7.7)
Neutrophils Relative %: 58 % (ref 43–77)

## 2015-03-30 LAB — URINALYSIS, ROUTINE W REFLEX MICROSCOPIC
BILIRUBIN URINE: NEGATIVE
Glucose, UA: NEGATIVE mg/dL
HGB URINE DIPSTICK: NEGATIVE
Ketones, ur: NEGATIVE mg/dL
Leukocytes, UA: NEGATIVE
Nitrite: NEGATIVE
Protein, ur: NEGATIVE mg/dL
SPECIFIC GRAVITY, URINE: 1.015 (ref 1.005–1.030)
UROBILINOGEN UA: 0.2 mg/dL (ref 0.0–1.0)
pH: 8 (ref 5.0–8.0)

## 2015-03-30 LAB — GLUCOSE, CAPILLARY: Glucose-Capillary: 124 mg/dL — ABNORMAL HIGH (ref 70–99)

## 2015-03-30 LAB — ETHANOL

## 2015-03-30 LAB — PROTIME-INR
INR: 1.03 (ref 0.00–1.49)
Prothrombin Time: 13.6 seconds (ref 11.6–15.2)

## 2015-03-30 LAB — CBG MONITORING, ED: Glucose-Capillary: 112 mg/dL — ABNORMAL HIGH (ref 70–99)

## 2015-03-30 MED ORDER — NIFEDIPINE ER 60 MG PO TB24
60.0000 mg | ORAL_TABLET | Freq: Every day | ORAL | Status: DC
  Administered 2015-03-31 – 2015-04-02 (×3): 60 mg via ORAL
  Filled 2015-03-30 (×4): qty 1

## 2015-03-30 MED ORDER — ASPIRIN 325 MG PO TABS
325.0000 mg | ORAL_TABLET | Freq: Every day | ORAL | Status: DC
  Administered 2015-03-30: 325 mg via ORAL
  Filled 2015-03-30: qty 1

## 2015-03-30 MED ORDER — LISINOPRIL 2.5 MG PO TABS
2.5000 mg | ORAL_TABLET | Freq: Every day | ORAL | Status: DC
  Administered 2015-03-31 – 2015-04-02 (×3): 2.5 mg via ORAL
  Filled 2015-03-30 (×3): qty 1

## 2015-03-30 MED ORDER — CALCIUM CARB-CHOLECALCIFEROL 600-800 MG-UNIT PO TABS: 1.0000 | ORAL_TABLET | Freq: Two times a day (BID) | ORAL | Status: DC

## 2015-03-30 MED ORDER — IOHEXOL 350 MG/ML SOLN
80.0000 mL | Freq: Once | INTRAVENOUS | Status: AC | PRN
Start: 2015-03-30 — End: 2015-03-30
  Administered 2015-03-30: 80 mL via INTRAVENOUS

## 2015-03-30 MED ORDER — STROKE: EARLY STAGES OF RECOVERY BOOK: Freq: Once | Status: DC

## 2015-03-30 MED ORDER — METOPROLOL TARTRATE 50 MG PO TABS
50.0000 mg | ORAL_TABLET | Freq: Two times a day (BID) | ORAL | Status: DC
  Administered 2015-03-31 – 2015-04-02 (×5): 50 mg via ORAL
  Filled 2015-03-30 (×6): qty 1

## 2015-03-30 MED ORDER — INSULIN ASPART 100 UNIT/ML ~~LOC~~ SOLN: 0.0000 [IU] | SUBCUTANEOUS | Status: DC

## 2015-03-30 MED ORDER — ASPIRIN EC 325 MG PO TBEC
325.0000 mg | DELAYED_RELEASE_TABLET | Freq: Every day | ORAL | Status: DC
  Administered 2015-03-31 – 2015-04-02 (×3): 325 mg via ORAL
  Filled 2015-03-30 (×3): qty 1

## 2015-03-30 MED ORDER — CALCIUM CARBONATE-VITAMIN D 500-200 MG-UNIT PO TABS
1.0000 | ORAL_TABLET | Freq: Two times a day (BID) | ORAL | Status: DC
  Administered 2015-03-31 – 2015-04-02 (×5): 1 via ORAL
  Filled 2015-03-30 (×5): qty 1

## 2015-03-30 MED ORDER — ADULT MULTIVITAMIN W/MINERALS CH
1.0000 | ORAL_TABLET | Freq: Every day | ORAL | Status: DC
  Administered 2015-03-31 – 2015-04-02 (×3): 1 via ORAL
  Filled 2015-03-30 (×3): qty 1

## 2015-03-30 MED ORDER — SENNOSIDES-DOCUSATE SODIUM 8.6-50 MG PO TABS: 1.0000 | ORAL_TABLET | Freq: Every evening | ORAL | Status: DC | PRN

## 2015-03-30 MED ORDER — ASPIRIN 300 MG RE SUPP: 300.0000 mg | Freq: Every day | RECTAL | Status: DC

## 2015-03-30 MED ORDER — RALOXIFENE HCL 60 MG PO TABS
60.0000 mg | ORAL_TABLET | Freq: Every day | ORAL | Status: DC
  Administered 2015-03-30 – 2015-04-02 (×4): 60 mg via ORAL
  Filled 2015-03-30 (×5): qty 1

## 2015-03-30 MED ORDER — OMEGA-3-ACID ETHYL ESTERS 1 G PO CAPS
1.0000 g | ORAL_CAPSULE | Freq: Two times a day (BID) | ORAL | Status: DC
  Administered 2015-03-30 – 2015-04-02 (×5): 1 g via ORAL
  Filled 2015-03-30 (×6): qty 1

## 2015-03-30 MED ORDER — ASPIRIN EC 81 MG PO TBEC: 81.0000 mg | DELAYED_RELEASE_TABLET | Freq: Every day | ORAL | Status: DC

## 2015-03-30 MED ORDER — PRAVASTATIN SODIUM 20 MG PO TABS: 20.0000 mg | ORAL_TABLET | Freq: Every day | ORAL | Status: DC

## 2015-03-30 MED ORDER — HEPARIN SODIUM (PORCINE) 5000 UNIT/ML IJ SOLN
5000.0000 [IU] | Freq: Three times a day (TID) | INTRAMUSCULAR | Status: DC
  Administered 2015-03-30 – 2015-04-02 (×9): 5000 [IU] via SUBCUTANEOUS
  Filled 2015-03-30 (×9): qty 1

## 2015-03-30 NOTE — H&P (Signed)
[facility] History and Physical                                                                                    Laura Melendez, is a 74 y.o. female  MRN: [id number]   DOB - 05-26-1941  Admit Date - 03/30/2015  Outpatient Primary MD for the patient is [name], MD  With History of -  Past Medical History  Diagnosis Date  . Essential hypertension, benign   . Type 2 diabetes mellitus   . Osteoporosis 06/2008      Past Surgical History  Procedure Laterality Date  . Cholecystectomy    . Ankle surgery Left 2004    otif  . Abdominal hysterectomy    . Anterior and posterior repair N/A 03/11/2013    Procedure: ANTERIOR (CYSTOCELE) AND POSTERIOR REPAIR (RECTOCELE);  Surgeon: Linda Hedges, DO;  Location: [facility];  Service: Gynecology;  Laterality: N/A;  with sacrospinous ligament fixation bilateral  . Colonoscopy  2009    negative  . Cataract extraction w/phaco Right 12/27/2014    Procedure: CATARACT EXTRACTION PHACO AND INTRAOCULAR LENS PLACEMENT RIGHT EYE;  Surgeon: Tonny Branch, MD;  Location: [facility];  Service: Ophthalmology;  Laterality: Right;  CDE:10.63  . Cataract extraction w/phaco Left 01/13/2015    Procedure: CATARACT EXTRACTION PHACO AND INTRAOCULAR LENS PLACEMENT LEFT EYE;  Surgeon: Tonny Branch, MD;  Location: [facility];  Service: Ophthalmology;  Laterality: Left;  CDE:18.87    in for   Chief Complaint  Patient presents with  . Extremity Weakness     HPI This is a 74 year old female patient with known history of diabetes, hypertension, osteoporosis who at baseline is ambulatory and active. On 4/12 family noticed the patient was not acting like herself. This morning her husband was awakened noticing the patient had urinated in the bed. The husband had the patient stand up by the side of the bed when he changed she eats noting no apparent neurological deficits at that time. They subsequently went back to sleep and when they awaken around 9 AM the patient was noticed to  have weakness in her right leg more than usual. EMS was called and the patient was transported to the ER.  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 On exam in the ER the only neurological deficit the patient had was distant weakness in the right lower extremity. Her vital signs were stable she was afebrile. She is maintaining O2 sats of 98% on room air. Her electrolyte panel was normal except for mildly elevated glucose of 132. Urinalysis was within normal limits except for cloudy appearance. Alcohol level was less than 5, urine drug screen was negative. Patient has been evaluated by the neurologist and a MRI of the brain is pending.  During our exam pt noted to be mildly confused and somewhat slow to respond to questions asked. Pt thought it was 1964. She was having difficulty following commands and since her earlier exam with the neurologist seemed to have new RUE weakness with not using right arm (neglect). RN made aware and has paged  Dr. Aram Beecham. No family at bedside to assist with history. She did not receive sedation for her MRI.  Review of Systems   Pt confused therefore ROS unreliable  *A full 10 point Review of Systems was done, except as stated above, all other Review of Systems were negative.  Social History History  Substance Use Topics  . Smoking status: Former Smoker -- 0.25 packs/day for 30 years    Types: Cigarettes    Quit date: 02/27/2015  . Smokeless tobacco: Not on file  . Alcohol Use: No    Family History Family History  Problem Relation Age of Onset  . Cancer Father     Liver  . Cancer Sister     Breast  . Diabetes Brother     Prior to Admission medications   Medication Sig Start Date End Date Taking? Authorizing Provider  aspirin EC 81 MG tablet Take 81 mg by mouth daily.   Yes Historical Provider, MD  Calcium Carb-Cholecalciferol (CALCIUM 600/VITAMIN D3) 600-800  MG-UNIT TABS Take 1 tablet by mouth 2 (two) times daily with a meal.   Yes Historical Provider, MD  Cholecalciferol (VITAMIN D3) 400 UNITS CAPS Take 1 capsule by mouth 3 (three) times daily.   Yes Historical Provider, MD  CINNAMON PO Take 1,000 mg by mouth daily.   Yes Historical Provider, MD  CRANBERRY PO Take 84 mg by mouth daily.   Yes Historical Provider, MD  Multiple Vitamin (MULTIVITAMIN WITH MINERALS) TABS Take 1 tablet by mouth daily.   Yes Historical Provider, MD  Omega-3 Fatty Acids (FISH OIL CONCENTRATE) 300 MG CAPS Take 1 capsule by mouth daily.   Yes Historical Provider, MD  pravastatin (PRAVACHOL) 20 MG tablet Take 1 tablet (20 mg total) by mouth daily. 02/22/15  Yes [name], MD  alendronate (FOSAMAX) 70 MG tablet Take 1 tablet (70 mg total) by mouth every 7 (seven) days. 09/24/13   [name], MD  lisinopril (PRINIVIL,ZESTRIL) 2.5 MG tablet Take 1 tablet (2.5 mg total) by mouth daily. 09/24/13   [name], MD  metFORMIN (GLUCOPHAGE) 500 MG tablet Take 1 tablet (500 mg total) by mouth 2 (two) times daily with a meal. 09/24/13   [name], MD  metoprolol (LOPRESSOR) 50 MG tablet Take 1 tablet (50 mg total) by mouth 2 (two) times daily. 09/24/13   [name], MD  naproxen sodium (ANAPROX) 220 MG tablet Take 220 mg by mouth daily as needed (pain).    Historical Provider, MD  NIFEdipine (PROCARDIA XL/ADALAT-CC) 60 MG 24 hr tablet Take 1 tablet (60 mg total) by mouth daily. 09/24/13   [name], MD  raloxifene (EVISTA) 60 MG tablet Take 1 tablet (60 mg total) by mouth daily. 09/24/13   [name], MD    No Known Allergies  Physical Exam  Vitals  Blood pressure 121/56, pulse 66, temperature 98.7 F (37.1 C), temperature source Oral, resp. rate 17, height 5\' 1"  (1.549 m), weight 100 lb (45.36 kg), SpO2 96 %.   General:  In no acute distress, appears healthy and well nourished  Psych:  Flat affect, Awake , Oriented X name only. Speech soft spoken  quality  Neuro:  CN II through XII intact, Strength 5/5 all left side extremities, right arm and leg quite weak at 2-3/5, seems to demonstrate right side neglect, Sensation intact all 4 extremities. No apparent hemianopsia  ENT:  Ears and Eyes appear Normal, Conjunctivae clear, PER. Moist oral mucosa without erythema or  exudates.  Neck:  Supple, No lymphadenopathy appreciated  Respiratory:  Symmetrical chest wall movement, Good air movement bilaterally, CTAB. Room Air  Cardiac:  RRR, No Murmurs, no LE edema noted, no JVD, No carotid bruits, peripheral pulses palpable at 2+  Abdomen:  Positive bowel sounds, Soft, Non tender, Non distended,  No masses appreciated, no obvious hepatosplenomegaly  Skin:  No Cyanosis, Normal Skin Turgor, No Skin Rash or Bruise.  Extremities: Symmetrical without obvious trauma or injury,  no effusions.  Data Review  CBC  Recent Labs Lab 03/30/15 1212 03/30/15 1217  WBC 6.2  --   HGB 13.4 14.6  HCT 40.9 43.0  PLT 204  --   MCV 81.5  --   MCH 26.7  --   MCHC 32.8  --   RDW 14.4  --   LYMPHSABS 2.1  --   MONOABS 0.4  --   EOSABS 0.1  --   BASOSABS 0.1  --     Chemistries   Recent Labs Lab 03/30/15 1212 03/30/15 1217  NA 142 142  K 3.9 3.9  CL 105 105  CO2 26  --   GLUCOSE 133* 132*  BUN 13 15  CREATININE 0.58 0.60  CALCIUM 9.7  --   AST 22  --   ALT 16  --   ALKPHOS 40  --   BILITOT 0.5  --     estimated creatinine clearance is 44.2 mL/min (by C-G formula based on Cr of 0.6).  No results for input(s): TSH, T4TOTAL, T3FREE, THYROIDAB in the last 72 hours.  Invalid input(s): FREET3  Coagulation profile  Recent Labs Lab 03/30/15 1212  INR 1.03    No results for input(s): DDIMER in the last 72 hours.  Cardiac Enzymes No results for input(s): CKMB, TROPONINI, MYOGLOBIN in the last 168 hours.  Invalid input(s): CK  Invalid input(s): POCBNP  Urinalysis    Component Value Date/Time   COLORURINE YELLOW 03/30/2015  1520   APPEARANCEUR CLOUDY* 03/30/2015 1520   LABSPEC 1.015 03/30/2015 1520   PHURINE 8.0 03/30/2015 1520   GLUCOSEU NEGATIVE 03/30/2015 1520   HGBUR NEGATIVE 03/30/2015 1520   [place] 03/30/2015 1520   KETONESUR NEGATIVE 03/30/2015 1520   PROTEINUR NEGATIVE 03/30/2015 1520   PROTEINUR 30 02/24/2014 1000   UROBILINOGEN 0.2 03/30/2015 1520   NITRITE NEGATIVE 03/30/2015 1520   LEUKOCYTESUR NEGATIVE 03/30/2015 1520    Imaging results:   Ct Head Wo Contrast  03/30/2015   ADDENDUM REPORT: 03/30/2015 15:35  ADDENDUM: Critical Value/emergent results were called by telephone at the time of interpretation on 03/30/2015 at 3:35 pm to Dr. Aram Beecham, neurology , who verbally acknowledged these results.   Electronically Signed   By: Lowella Grip III M.D.   On: 03/30/2015 15:35   03/30/2015   CLINICAL DATA:  Acute onset right lower extremity weakness  EXAM: CT HEAD WITHOUT CONTRAST  TECHNIQUE: Contiguous axial images were obtained from the base of the skull through the vertex without intravenous contrast.  COMPARISON:  January 29, 2015  FINDINGS: There is mild diffuse atrophy. There is no intracranial mass hemorrhage, extra-axial fluid collection, or midline shift. There is evidence of a prior small infarct in the mid left cerebellum, stable. There is small vessel disease throughout the periventricular white matter bilaterally. There is a new area of decreased attenuation in the posterior right corona radiata which may represent an acute/recent small white matter infarct in this area. No other new gray-white compartment lesion identified. There is small vessel disease  in the anterior limb of the left internal capsule, stable. There is also a prior small infarct in the right thalamus, stable. Bony calvarium appears intact. The mastoid air cells are clear. The middle cerebral arteries have symmetric attenuation and appear stable bilaterally.  IMPRESSION: Suspect new small white matter infarct in  the right corona radiata. Extensive supratentorial/periventricular small vessel disease remains. Prior small infarct in the anterior limb of the left internal capsule as well as in the right thalamus and left cerebellum remains stable. No hemorrhage or mass effect.  Electronically Signed: By: Lowella Grip III M.D. On: 03/30/2015 15:25     EKG: Sinus rhythm, old antero-septal infarct   Assessment & Plan  Principal Problem:   CVA (cerebral infarction) -Admit to telemetry -Appreciate neurology assistance -Follow up on stat MRI/MRA of brain -Check 2-D echocardiogram and carotid duplex -Check Hemoglobin A1c and lipid panel -No evidence of acute hemorrhagic infarct on CT so appropriate to utilize pharmacological DVT prophylaxis -PT/OT evaluation -Continue pravastatin -Advance diet after bedside RN swallow evaluation  ** Dr Aram Beecham made aware of neuro changes-tiny corona radiata CVA -ok to admit to telemetry floor  Active Problems:   Essential hypertension, benign -Continue home medications of Lopressor, Procardia and lisinopril but avoid overcorrection of blood pressure percent ischemic stroke    DM (diabetes mellitus) -Hold home Glucophage in acute setting  -Check CBGs and provide sliding scale insulin    Dyslipidemia -Statin as above    Former smoker    DVT Prophylaxis: Subcutaneous Heparin  Family Communication: No family at bedside  Code Status: Previously full code-no family at bedside to confirm if status has changed   Condition: Guarded  Time spent in minutes : 60   ELLIS,ALLISON L. ANP on 03/30/2015 at 4:01 PM  Between 7am to 7pm - Pager - [phone]  After 7pm go to [email or web address] - password [email or web address]  And look for the night coverage person covering me after hours  [facility]

## 2015-03-30 NOTE — ED Notes (Signed)
Patient transported to CT 

## 2015-03-30 NOTE — Consult Note (Addendum)
Referring Physician: Jeneen Rinks    Chief Complaint: Code stroke  HPI:                                                                                                                                         Laura Melendez is an 74 y.o. female with HTN, DM, who was noted yesterday to be acting different than usual (more quite and not very active).  This AM she was noted to have urinated in her bed. Her husband had her stand up and changed the sheets.  They went back to sleep.  She awoke around 0900 and was noted to have weakness in her right leg more than usual. EMS was called and she was brought to ED. While in ED she continued to show only right leg weakness. 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   Denies HA, vertigo, double vision, difficulty swallowing, slurred speech, language or vision impairment.  Date last known well: Date: 03/30/2015 Time last known well: Time: 03:00 tPA Given: No: out of window Modified Rankin: Rankin Score=1    Past Medical History  Diagnosis Date  . Essential hypertension, benign   . Type 2 diabetes mellitus   . Osteoporosis 06/2008    Past Surgical History  Procedure Laterality Date  . Cholecystectomy    . Ankle surgery Left 2004    otif  . Abdominal hysterectomy    . Anterior and posterior repair N/A 03/11/2013    Procedure: ANTERIOR (CYSTOCELE) AND POSTERIOR REPAIR (RECTOCELE);  Surgeon: Linda Hedges, DO;  Location: [facility];  Service: Gynecology;  Laterality: N/A;  with sacrospinous ligament fixation bilateral  . Colonoscopy  2009    negative  . Cataract extraction w/phaco Right 12/27/2014    Procedure: CATARACT EXTRACTION PHACO AND INTRAOCULAR LENS PLACEMENT RIGHT EYE;  Surgeon: Tonny Branch, MD;  Location: [facility];  Service: Ophthalmology;  Laterality: Right;  CDE:10.63  . Cataract extraction w/phaco Left 01/13/2015    Procedure: CATARACT EXTRACTION PHACO AND INTRAOCULAR LENS  PLACEMENT LEFT EYE;  Surgeon: Tonny Branch, MD;  Location: [facility];  Service: Ophthalmology;  Laterality: Left;  CDE:18.87    Family History  Problem Relation Age of Onset  . Cancer Father     Liver  . Cancer Sister     Breast  . Diabetes Brother    Social History:  reports that she quit smoking about 4 weeks ago. Her smoking use included Cigarettes. She has a 7.5 pack-year smoking history. She does not have any smokeless tobacco history on file. She reports that she does not drink alcohol or use illicit drugs. Family history: no epilepsy, brain tumors, or brain aneurysms. Allergies: No Known Allergies  Medications:  No current facility-administered medications for this encounter.   Current Outpatient Prescriptions  Medication Sig Dispense Refill  . alendronate (FOSAMAX) 70 MG tablet Take 1 tablet (70 mg total) by mouth every 7 (seven) days. 12 tablet 3  . aspirin EC 81 MG tablet Take 81 mg by mouth daily.    . Calcium Carb-Cholecalciferol (CALCIUM 600/VITAMIN D3) 600-800 MG-UNIT TABS Take 1 tablet by mouth 2 (two) times daily with a meal.    . Cholecalciferol (VITAMIN D3) 400 UNITS CAPS Take 1 capsule by mouth 3 (three) times daily.    Marland Kitchen CINNAMON PO Take 1,000 mg by mouth daily.    Marland Kitchen CRANBERRY PO Take 84 mg by mouth daily.    Marland Kitchen lisinopril (PRINIVIL,ZESTRIL) 2.5 MG tablet Take 1 tablet (2.5 mg total) by mouth daily. 90 tablet 3  . metFORMIN (GLUCOPHAGE) 500 MG tablet Take 1 tablet (500 mg total) by mouth 2 (two) times daily with a meal. 180 tablet 3  . metoprolol (LOPRESSOR) 50 MG tablet Take 1 tablet (50 mg total) by mouth 2 (two) times daily. 180 tablet 3  . Multiple Vitamin (MULTIVITAMIN WITH MINERALS) TABS Take 1 tablet by mouth daily.    . naproxen sodium (ANAPROX) 220 MG tablet Take 220 mg by mouth daily as needed (pain).    Marland Kitchen NIFEdipine (PROCARDIA XL/ADALAT-CC)  60 MG 24 hr tablet Take 1 tablet (60 mg total) by mouth daily. 90 tablet 3  . Omega-3 Fatty Acids (FISH OIL CONCENTRATE) 300 MG CAPS Take 1 capsule by mouth daily.    . pravastatin (PRAVACHOL) 20 MG tablet Take 1 tablet (20 mg total) by mouth daily. 30 tablet 2  . raloxifene (EVISTA) 60 MG tablet Take 1 tablet (60 mg total) by mouth daily. 90 tablet 3     ROS:                                                                                                                                       History obtained from the patient  General ROS: negative for - chills, fatigue, fever, night sweats, weight gain or weight loss Psychological ROS: negative for - behavioral disorder, hallucinations, memory difficulties, mood swings or suicidal ideation Ophthalmic ROS: negative for - blurry vision, double vision, eye pain or loss of vision ENT ROS: negative for - epistaxis, nasal discharge, oral lesions, sore throat, tinnitus or vertigo Allergy and Immunology ROS: negative for - hives or itchy/watery eyes Hematological and Lymphatic ROS: negative for - bleeding problems, bruising or swollen lymph nodes Endocrine ROS: negative for - galactorrhea, hair pattern changes, polydipsia/polyuria or temperature intolerance Respiratory ROS: negative for - cough, hemoptysis, shortness of breath or wheezing Cardiovascular ROS: negative for - chest pain, dyspnea on exertion, edema or irregular heartbeat Gastrointestinal ROS: negative for - abdominal pain, diarrhea, hematemesis, nausea/vomiting or stool incontinence Genito-Urinary ROS: negative for - dysuria, hematuria, incontinence or urinary frequency/urgency Musculoskeletal ROS: negative for -  joint swelling or muscular weakness Neurological ROS: as noted in HPI Dermatological ROS: negative for rash and skin lesion changes  Physical Examination:                                                                                                      Blood pressure  143/58, pulse 76, temperature 98.7 F (37.1 C), temperature source Oral, resp. rate 14, height 5\' 1"  (1.549 m), weight 45.36 kg (100 lb), SpO2 98 %.  HEENT-  Normocephalic, no lesions, without obvious abnormality.  Normal external eye and conjunctiva.  Normal TM's bilaterally.  Normal auditory canals and external ears. Normal external nose, mucus membranes and septum.  Normal pharynx. Cardiovascular- S1, S2 normal, pulses palpable throughout   Lungs- chest clear, no wheezing, rales, normal symmetric air entry, Heart exam - S1, S2 normal, no murmur, no gallop, rate regular Abdomen- normal findings: bowel sounds normal Extremities- no edema Lymph-no adenopathy palpable Musculoskeletal-no joint tenderness, deformity or swelling Skin-warm and dry, no hyperpigmentation, vitiligo, or suspicious lesions  Neurological Examination Mental Status: Alert, oriented, thought content appropriate.  Speech fluent without evidence of aphasia.  Able to follow 3 step commands without difficulty. Cranial Nerves: II: Discs flat bilaterally; Visual fields grossly normal, pupils equal, round, reactive to light and accommodation III,IV, VI: ptosis not present, extra-ocular motions intact bilaterally V,VII: smile symmetric, facial light touch sensation normal bilaterally VIII: hearing normal bilaterally IX,X: uvula rises symmetrically XI: bilateral shoulder shrug XII: midline tongue extension Motor: Right : Upper extremity   5/5    Left:     Upper extremity   5/5  Lower extremity   3/5     Lower extremity   5/5 Tone and bulk:normal tone throughout; no atrophy noted Sensory: Pinprick and light touch intact throughout, bilaterally Deep Tendon Reflexes: 2+ and symmetric throughout Plantars: Right: downgoing   Left: downgoing Cerebellar: normal finger-to-nose,  and normal heel-to-shin test on the left --right unable to obtain due to weakness.  Gait: not tested.       Lab Results: Basic Metabolic  Panel:  Recent Labs Lab 03/30/15 1217  NA 142  K 3.9  CL 105  GLUCOSE 132*  BUN 15  CREATININE 0.60    Liver Function Tests: No results for input(s): AST, ALT, ALKPHOS, BILITOT, PROT, ALBUMIN in the last 168 hours. No results for input(s): LIPASE, AMYLASE in the last 168 hours. No results for input(s): AMMONIA in the last 168 hours.  CBC:  Recent Labs Lab 03/30/15 1212 03/30/15 1217  WBC 6.2  --   NEUTROABS 3.6  --   HGB 13.4 14.6  HCT 40.9 43.0  MCV 81.5  --   PLT 204  --     Cardiac Enzymes: No results for input(s): CKTOTAL, CKMB, CKMBINDEX, TROPONINI in the last 168 hours.  Lipid Panel: No results for input(s): CHOL, TRIG, HDL, CHOLHDL, VLDL, LDLCALC in the last 168 hours.  CBG: No results for input(s): GLUCAP in the last 168 hours.  Microbiology: Results for orders placed or performed during the hospital encounter of 01/28/15  Urine culture  Status: None   Collection Time: 01/28/15 10:51 PM  Result Value Ref Range Status   Specimen Description URINE, RANDOM  Final   Special Requests NONE  Final   Colony Count   Final    7,000 COLONIES/ML Performed at [facility]    Culture   Final    INSIGNIFICANT GROWTH Performed at [facility]    Report Status 01/30/2015 FINAL  Final  CSF culture     Status: None   Collection Time: 01/29/15  1:25 AM  Result Value Ref Range Status   Specimen Description BACK  Final   Special Requests Normal  Final   Gram Stain   Final    NO WBC SEEN NO ORGANISMS SEEN CYTOSPIN Performed at [facility] Performed at [facility]    Culture   Final    NO GROWTH 3 DAYS Performed at [facility]    Report Status 02/01/2015 FINAL  Final  Gram stain     Status: None   Collection Time: 01/29/15  1:25 AM  Result Value Ref Range Status   Specimen Description CSF  Final   Special Requests Normal  Final   Gram Stain CYTOSPUN NO ORGANISMS SEEN NO WBC SEEN   Final   Report Status  01/29/2015 FINAL  Final  Herpes simplex virus culture     Status: None   Collection Time: 01/29/15  1:25 AM  Result Value Ref Range Status   Specimen Description BACK  Final   Special Requests Normal  Final   Culture   Final    THIS TEST WAS ORDERED IN ERROR AND HAS BEEN CREDITED.   Report Status 01/31/2015 FINAL  Final  Culture, blood (routine x 2)     Status: None   Collection Time: 01/29/15  5:20 AM  Result Value Ref Range Status   Specimen Description BLOOD LEFT ARM  Final   Special Requests BOTTLES DRAWN AEROBIC AND ANAEROBIC 5CC EACH  Final   Culture   Final    NO GROWTH 5 DAYS Performed at [facility]    Report Status 02/04/2015 FINAL  Final  Culture, blood (routine x 2)     Status: None   Collection Time: 01/29/15  5:25 AM  Result Value Ref Range Status   Specimen Description BLOOD LEFT FOREARM  Final   Special Requests BOTTLES DRAWN AEROBIC AND ANAEROBIC 5CC EACH  Final   Culture   Final    NO GROWTH 5 DAYS Performed at [facility]    Report Status 02/04/2015 FINAL  Final    Coagulation Studies: No results for input(s): LABPROT, INR in the last 72 hours.  Imaging: No results found.     Assessment and plan discussed with with attending physician and they are in agreement.    Etta Quill PA-C [facility] [phone]  03/30/2015, 12:45 PM   Assessment: 74 y.o. female presenting with right leg weakness however LNW was 0300 hours and patient is out of window for tPA.  Cannot rule out a left brain ischemic CVA in the ACA territory, unlikely a cord syndrome.    Stroke Risk Factors - diabetes mellitus  Recommend: 1. HgbA1c, fasting lipid panel 2. MRI, MRA  of the brain without contrast 3. PT consult, OT consult, Speech consult 4. Echocardiogram 5. Carotid dopplers 6. Prophylactic therapy-Antiplatelet med: Aspirin - dose 325 daily 7. Risk factor modification 8. Telemetry monitoring 9. Frequent neuro checks 10 NPO until passes  stroke swallow screen  Patient  seen and examined together with physician assistant and I concur with the assessment and plan.  Dorian Pod, MD

## 2015-03-30 NOTE — ED Notes (Signed)
Pt placed in gown and in bed. Pt monitored by pulse ox, bp cuff, and 12-lead. 

## 2015-03-30 NOTE — ED Notes (Signed)
CBG 112.  

## 2015-03-30 NOTE — Progress Notes (Signed)
Pt arrived to 4N11 via sdtretcher.  A/O x 3, VSS, telemetry applied( BOx 09).  No c/o pain or discomfort noted.  Pt family at bedside.  Passed swallow screen in ED, diet placed.  Will continue to monitor. Cori Razor, RN

## 2015-03-30 NOTE — ED Notes (Signed)
Per EMS- pt was at home lying in bed when she noticed that she could not raise her right leg at 1000. Pt reports another episode of this in the past- for which she was prescribed antibiotics. Pt is a/o x 4 with notable right lg drift and weakness. Plantar and dorsiflexion notably weaker on right side. Face symmetrical, minor right arm drift noted.

## 2015-03-31 ENCOUNTER — Inpatient Hospital Stay (HOSPITAL_COMMUNITY): Payer: Medicare HMO

## 2015-03-31 DIAGNOSIS — E1159 Type 2 diabetes mellitus with other circulatory complications: Secondary | ICD-10-CM

## 2015-03-31 DIAGNOSIS — I7410 Embolism and thrombosis of unspecified parts of aorta: Secondary | ICD-10-CM | POA: Insufficient documentation

## 2015-03-31 DIAGNOSIS — I6789 Other cerebrovascular disease: Secondary | ICD-10-CM

## 2015-03-31 DIAGNOSIS — I639 Cerebral infarction, unspecified: Secondary | ICD-10-CM | POA: Insufficient documentation

## 2015-03-31 LAB — GLUCOSE, CAPILLARY
GLUCOSE-CAPILLARY: 112 mg/dL — AB (ref 70–99)
Glucose-Capillary: 109 mg/dL — ABNORMAL HIGH (ref 70–99)
Glucose-Capillary: 96 mg/dL (ref 70–99)
Glucose-Capillary: 122 mg/dL — ABNORMAL HIGH (ref 70–99)

## 2015-03-31 LAB — LIPID PANEL
CHOLESTEROL: 166 mg/dL (ref 0–200)
HDL: 41 mg/dL (ref >39)
LDL Cholesterol: 91 mg/dL (ref 0–99)
Total CHOL/HDL Ratio: 4 RATIO
Triglycerides: 170 mg/dL — ABNORMAL HIGH (ref <150)
VLDL: 34 mg/dL (ref 0–40)

## 2015-03-31 LAB — PROTIME-INR
INR: 1.07 (ref 0.00–1.49)
Prothrombin Time: 14 seconds (ref 11.6–15.2)

## 2015-03-31 MED ORDER — PRAVASTATIN SODIUM 40 MG PO TABS
40.0000 mg | ORAL_TABLET | Freq: Every day | ORAL | Status: DC
  Administered 2015-03-31 – 2015-04-01 (×2): 40 mg via ORAL
  Filled 2015-03-31 (×2): qty 1

## 2015-03-31 MED ORDER — INSULIN ASPART 100 UNIT/ML ~~LOC~~ SOLN
0.0000 [IU] | Freq: Three times a day (TID) | SUBCUTANEOUS | Status: DC
  Administered 2015-04-01: 1 [IU] via SUBCUTANEOUS

## 2015-03-31 MED ORDER — IOHEXOL 300 MG/ML  SOLN
100.0000 mL | Freq: Once | INTRAMUSCULAR | Status: AC | PRN
  Administered 2015-03-31: 100 mL via INTRAVENOUS

## 2015-03-31 NOTE — Progress Notes (Signed)
PT Cancellation Note  Patient Details Name: Laura Melendez MRN: [id number] DOB: 07/23/1941   Cancelled Treatment:    Reason Eval/Treat Not Completed: Patient at procedure or test/unavailable Patient off unit for ECHO. Will follow up as schedule/time allows.  Ellouise Newer 03/31/2015, 3:43 PM Camille Bal [place], [place]

## 2015-03-31 NOTE — Progress Notes (Signed)
STROKE TEAM PROGRESS NOTE   HISTORY Laura Melendez is an 74 y.o. female with HTN, DM, who was noted yesterday to be acting different than usual (more quite and not very active). This AM she was noted to have urinated in her bed. Her husband had her stand up and changed the sheets (LKW 03/30/2015 at 64). They went back to sleep. She awoke around 0900 and was noted to have weakness in her right leg more than usual. EMS was called and she was brought to ED. While in ED she continued to show only right leg weakness. 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 Denies HA, vertigo, double vision, difficulty swallowing, slurred speech, language or vision impairment. Patient was not administered TPA secondary to delay in arrival. She was admitted for further evaluation and treatment.   SUBJECTIVE (INTERVAL HISTORY) Her husband and daughter are at the bedside.  Overall she feels her condition is unchanged. Husband reports that Tues she was laying around a lot, walking real slow Tues afternoon arount 7p. Afterwards, was able to get a bath and go to bed, just took longer than normal. Patient tearful, upset with her new diagnosis. Pt still smoking.   OBJECTIVE Temp:  [97.6 F (36.4 C)-99 F (37.2 C)] 98.6 F (37 C) (04/14 1345) Pulse Rate:  [61-83] 83 (04/14 1345) Cardiac Rhythm:  [-] Normal sinus rhythm (04/13 1959) Resp:  [16-18] 18 (04/14 1345) BP: (127-171)/(48-96) 142/60 mmHg (04/14 1345) SpO2:  [95 %-98 %] 96 % (04/14 1345)   Recent Labs Lab 03/30/15 1646 03/30/15 2133 03/31/15 0736 03/31/15 1205 03/31/15 1705  GLUCAP 112* 124* 109* 112* 96    Recent Labs Lab 03/30/15 1212 03/30/15 1217  NA 142 142  K 3.9 3.9  CL 105 105  CO2 26  --   GLUCOSE 133* 132*  BUN 13 15  CREATININE 0.58 0.60  CALCIUM 9.7  --     Recent Labs Lab 03/30/15 1212  AST 22  ALT 16  ALKPHOS 40  BILITOT 0.5  PROT 7.4   ALBUMIN 4.0    Recent Labs Lab 03/30/15 1212 03/30/15 1217  WBC 6.2  --   NEUTROABS 3.6  --   HGB 13.4 14.6  HCT 40.9 43.0  MCV 81.5  --   PLT 204  --    No results for input(s): CKTOTAL, CKMB, CKMBINDEX, TROPONINI in the last 168 hours.  Recent Labs  03/30/15 1212 03/31/15 1224  LABPROT 13.6 14.0  INR 1.03 1.07    Recent Labs  03/30/15 1520  COLORURINE YELLOW  LABSPEC 1.015  PHURINE 8.0  GLUCOSEU NEGATIVE  HGBUR NEGATIVE  BILIRUBINUR NEGATIVE  KETONESUR NEGATIVE  PROTEINUR NEGATIVE  UROBILINOGEN 0.2  NITRITE NEGATIVE  LEUKOCYTESUR NEGATIVE       Component Value Date/Time   CHOL 166 03/31/2015 0625   CHOL 211* 02/21/2015 0810   TRIG 170* 03/31/2015 0625   HDL 41 03/31/2015 0625   HDL 52 02/21/2015 0810   CHOLHDL 4.0 03/31/2015 0625   CHOLHDL 4.1 02/21/2015 0810   VLDL 34 03/31/2015 0625   LDLCALC 91 03/31/2015 0625   LDLCALC 124* 02/21/2015 0810   Lab Results  Component Value Date   HGBA1C 6.7 02/15/2015      Component Value Date/Time   LABOPIA NONE DETECTED 03/30/2015 1520   COCAINSCRNUR NONE DETECTED 03/30/2015 1520   LABBENZ NONE DETECTED 03/30/2015 1520   AMPHETMU NONE DETECTED 03/30/2015 1520   THCU  NONE DETECTED 03/30/2015 1520   LABBARB NONE DETECTED 03/30/2015 1520     Recent Labs Lab 03/30/15 1212  [place] <5   I have personally reviewed the radiological images below and agree with the radiology interpretations.  Ct Head Wo Contrast 03/30/2015   Suspect new small white matter infarct in the right corona radiata. Extensive supratentorial/periventricular small vessel disease remains. Prior small infarct in the anterior limb of the left internal capsule as well as in the right thalamus and left cerebellum remains stable. No hemorrhage or mass effect.    Ct Angio Neck W/cm &/or Wo/cm 03/31/2015    1. 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 No  other Hemodynamically significant stenosis of the carotid or vertebral arteries in the neck. 2. 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     Mr Brain Wo Contrast 03/30/2015    1. Punctate acute left frontal lobe infarcts. 2. Moderate chronic small vessel ischemic disease and chronic lacunar infarcts.  Mr Jodene Nam Head Wo Contrast 03/30/2015   3. No major intracranial arterial occlusion or significant proximal anterior circulation stenosis. Moderate proximal right PCA stenosis.     2D echo - pending  TEE - pending  CTA chest - pending   PHYSICAL EXAM  Temp:  [97.6 F (36.4 C)-99 F (37.2 C)] 98.6 F (37 C) (04/14 1345) Pulse Rate:  [61-83] 83 (04/14 1345) Resp:  [16-18] 18 (04/14 1345) BP: (127-171)/(48-96) 142/60 mmHg (04/14 1345) SpO2:  [95 %-98 %] 96 % (04/14 1345)  General - Well nourished, well developed, depressed mood, flat affect, tearing.  Ophthalmologic - not cooperative on exam.  Cardiovascular - Regular rate and rhythm.  Mental Status -  Level of arousal and orientation to time, place, and person were intact. Language including expression, naming, repetition, comprehension was assessed and found intact. However, pt demonstrated psychomotor slowing.  Cranial Nerves II - XII - II - Visual field intact OU. III, IV, VI - Extraocular movements intact. V - Facial sensation intact bilaterally. VII - Facial movement intact bilaterally. VIII - Hearing & vestibular intact bilaterally. X - Palate elevates symmetrically. XI - Chin turning & shoulder shrug intact bilaterally. XII - Tongue protrusion intact.  Motor Strength - The patient's strength was RUE 3/5 and RLE 2/5 and pronator drift was present on the right. LUE and LLE 4/5. Bulk was normal and fasciculations were absent.   Motor Tone - Muscle tone was assessed at the neck and appendages and was normal.  Reflexes - The patient's reflexes were 1+ in all extremities and she had no pathological  reflexes.  Sensory - Light touch, temperature/pinprick were assessed and were symmetrical.    Coordination - The patient had normal movements in the right hand with no ataxia or dysmetria.  Tremor was absent.  Gait and Station - not tested due to weakness.   ASSESSMENT/PLAN Laura Melendez is a 74 y.o. female with history of HTN, DM presenting who woke after urinating in the bed with right leg weakness. She did not receive IV t-PA due to delay in arrival.   Stroke:  Dominant left punctate ACA territory infarcts, etiology unclear, possibly embolic, in setting of recent admission for ? Sepsis and descending aortic thrombus/plaque  Resultant  Right hemiparesis, depression/sadness  MRI  puctate L ACA acute infarcts  MRA  No anterior circulation significant stenosis; mod R PCA stenosis  CTA  neck no significant large vessel stenosis, however found to have possible descending aorta thrombus.  2D Echo  pending   TEE to rule out embolic source, eval aortic thrombus  LDL 124 in 02/2015, not at goal  HgbA1c 6.7 in March 2016  Heparin 5000 units sq tid for VTE prophylaxis Diet heart healthy/carb modified Room service appropriate?: Yes; Fluid consistency:: Thin Diet NPO time specified  aspirin 81 mg orally every day prior to admission, increased to  aspirin 325 mg orally every day. Pending test results, may consider dual antiplatelets x 3 months.  Ongoing aggressive stroke risk factor management  Therapy recommendations:  pending   Disposition:  pending   ? 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 sepsis one week ago as per husband.  May consider anticoagulation if indicated.  Hypertension  Home meds:   Lisinopril, lopressor  Stable  Permissive hypertension (OK if < 220/120) but gradually  normalize in 5-7 days   Hyperlipidemia  Home meds:  Omega 3 fish oil, pravachol 20 (newly added since March by primary care), resumed in hospital  LDL 124, goal < 70  Continue statin at discharge  Diabetes  HgbA1c 6.21 February 2015, at goal < 7.0  Controlled  Other Stroke Risk Factors  Advanced age  Cigarette smoker, patient reports she quit smoking 4 weeks ago but husband said he had caught her sneaking cigarettes  Other Pertinent History  osteoporois  recent adm for toxic metabolic encephalopathy, ? Sepsis  Dr. Erlinda Hong discussed diagnosis, prognosis,  treatment options and plan of care with patient and family as well as with Dr. Candiss Norse.  Hospital day # [place] for Pager information 03/31/2015 11:12 AM   I, the attending vascular neurologist, have personally obtained a history, examined the patient, evaluated laboratory data, individually viewed imaging studies and agree with radiology interpretations. I also obtained additional history from pt's husband and daughter at bedside. I also discussed with Dr. Candiss Norse regarding her care plan. Together with the NP/PA, we formulated the assessment and plan of care which reflects our mutual decision.  I have made any additions or clarifications directly to the above note and agree with the findings and plan as currently documented.   74 yo F with history of hypertension, diabetes, smoker presented with left ACA punctate infarcts. MRA head unremarkable, however CTA of the neck showed possible descending aortic thrombus. Vascular surgery consulted, recommended CTA chest to evaluate aorta. We also recommend TEE to evaluate aortic thrombus, and to rule out endocarditis as source of possible stroke. LDL 124, continue statin. Currently on aspirin, depending on results of workup, may consider dural antiplatelet versus anticoagulation.  Rosalin Hawking, MD PhD Stroke Neurology 03/31/2015 5:50 PM       To  contact Stroke Continuity provider, please refer to [email or web address] After hours, contact General Neurology

## 2015-03-31 NOTE — Progress Notes (Signed)
    [facility] has been requested to perform a transesophageal echocardiogram on Laura Melendez for CVA.  After careful review of history and examination, the risks and benefits of transesophageal echocardiogram have been explained including risks of esophageal damage, perforation (1:10,000 risk), bleeding, pharyngeal hematoma as well as other potential complications associated with conscious sedation including aspiration, arrhythmia, respiratory failure and death. Alternatives to treatment were discussed, questions were answered. Patient is willing to proceed.   Erlene Quan, PA 03/31/2015 5:10 PM

## 2015-03-31 NOTE — Progress Notes (Signed)
UR complete.  Jonnell Hentges RN, MSN 

## 2015-03-31 NOTE — Evaluation (Signed)
Physical Therapy Evaluation Patient Details Name: Laura Melendez MRN: [id number] DOB: Dec 22, 1940 Today's Date: 03/31/2015   History of Present Illness  This 74 y.o. female amitted with MS changes incontinence and Rt LE weakness.  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  MRI of head showed subcortical punctate acute infarcts high medial Lt frontal lobe.  PMH includes:  DM; HTN; DDD lumbar spine  Clinical Impression  Pt admitted with the above diagnosis. Pt currently with functional limitations due to the deficits listed below (see PT Problem List). Demonstrating apraxic type movement disorder, especially with tasks involving Rt side. Delayed response with all motor commands requiring frequent facilitation.Tolerated transfer with +2 mod assist. Bears weight through RLE without buckling however, requires physical assist to advance RLE for pivotal steps during transfer. Very supportive family. Would greatly benefit from CIR to improve functional independence prior to returning home. Pt will benefit from skilled PT to increase their independence and safety with mobility to allow discharge to the venue listed below.       Follow Up Recommendations CIR    Equipment Recommendations   (TBD by next venue of care)    Recommendations for Other Services Rehab consult     Precautions / Restrictions Precautions Precautions: Fall Restrictions Weight Bearing Restrictions: No      Mobility  Bed Mobility Overal bed mobility: Needs Assistance Bed Mobility: Supine to Sit     Supine to sit: Max assist;HOB elevated     General bed mobility comments: Max assist for sequencing. Able to move RLE with facilatory techniques. Poor ability to follow commands with use of LUE. Requires signifcant assistance for truncal support  and to scoot to edge of bed with bed pad. Loses balance posteriorly at times.  Transfers Overall transfer level: Needs assistance Equipment used: 2 person  hand held assist Transfers: Sit to/from [facility] Sit to Stand: Mod assist Stand pivot transfers: Mod assist;+2 physical assistance       General transfer comment: Mod assist for boost with tactile facilitation to rise and bring hips anteriorly upon standing. Tolerated this position for 2 minutes while practicing weight shift. Rt knee block initially until pt standing fully upright then able to bend Rt knee and bear full weight. Mod assist +2 for small pivotal steps to chair. Requires assistance to advance RLE, Max cues for sequencing, and moderately delayed but capable of brining LLE forward and backwards.  Ambulation/Gait                Stairs            Wheelchair Mobility    Modified Rankin (Stroke Patients Only) Modified Rankin (Stroke Patients Only) Pre-Morbid Rankin Score: No symptoms Modified Rankin: Moderately severe disability     Balance Overall balance assessment: Needs assistance Sitting-balance support: Feet supported;Single extremity supported Sitting balance-Leahy Scale: Poor Sitting balance - Comments: Sat edge of bed for several minutes focusing on trunk control with intermittent loss of balance posteriorly requiring assist to correct. Also tipping towards left at times but may be due to attempt at using LUE for support. Postural control: Posterior lean;Left lateral lean Standing balance support: Single extremity supported Standing balance-Leahy Scale: Poor                               Pertinent Vitals/Pain Pain Assessment: No/denies pain    Home Living Family/patient expects to be discharged to:: Private residence Living  Arrangements: Spouse/significant other Available Help at Discharge: Family;Available 24 hours/day Type of Home: House Home Access: Stairs to enter Entrance Stairs-Rails: Chemical engineer of Steps: 5 Home Layout: One level Home Equipment: [place] - 2 wheels;Grab bars -  toilet;Grab bars - tub/shower      Prior Function Level of Independence: Independent         Comments: Still drives, Independent     Hand Dominance   Dominant Hand: Right    Extremity/Trunk Assessment   Upper Extremity Assessment: Defer to OT evaluation           Lower Extremity Assessment: RLE deficits/detail RLE Deficits / Details: Limited assessment due to apraxia and decreased ability to follow commands. Rt knee extension 3/5 with MMT, but held weight against gravity functionally.       Communication   Communication: No difficulties  Cognition Arousal/Alertness: Awake/alert Behavior During Therapy: Flat affect Overall Cognitive Status: Impaired/Different from baseline Area of Impairment: Attention;Following commands;Problem solving;Awareness;Safety/judgement   Current Attention Level: Sustained   Following Commands: Follows one step commands with increased time;Follows one step commands inconsistently Safety/Judgement: Decreased awareness of safety;Decreased awareness of deficits Awareness: Intellectual Problem Solving: Decreased initiation;Slow processing;Difficulty sequencing;Requires verbal cues;Requires tactile cues General Comments: Pt is very slow to initiate movement and conversation    General Comments General comments (skin integrity, edema, etc.): husband present during evaluation. Daughter and grandchilden arrived at end and patient became more engaged with with speaking.    Exercises        Assessment/Plan    PT Assessment Patient needs continued PT services  PT Diagnosis Difficulty walking;Hemiplegia dominant side;Altered mental status   PT Problem List Decreased strength;Decreased range of motion;Decreased activity tolerance;Decreased balance;Decreased mobility;Decreased coordination;Decreased cognition;Decreased knowledge of use of DME;Decreased safety awareness;Decreased knowledge of precautions  PT Treatment Interventions DME  instruction;Gait training;Functional mobility training;Therapeutic activities;Therapeutic exercise;Balance training;Neuromuscular re-education;Cognitive remediation;Patient/family education   PT Goals (Current goals can be found in the Care Plan section) Acute Rehab PT Goals Patient Stated Goal: Go home PT Goal Formulation: With patient/family Time For Goal Achievement: 04/14/15 Potential to Achieve Goals: Good    Frequency Min 4X/week   Barriers to discharge        Co-evaluation               End of Session Equipment Utilized During Treatment: Gait belt Activity Tolerance: Patient tolerated treatment well Patient left: in chair;with call bell/phone within reach;with chair alarm set;with family/visitor present Nurse Communication: Mobility status         Time: 8453-6468 PT Time Calculation (min) (ACUTE ONLY): 19 min   Charges:   PT Evaluation $Initial PT Evaluation Tier I: 1 Procedure     PT G CodesEllouise Newer 03/31/2015, 7:21 PM Camille Bal [place], [place]

## 2015-03-31 NOTE — Consult Note (Signed)
Physical Medicine and Rehabilitation Consult   Reason for Consult: Right sided weakness Referring Physician: Dr. Candiss Norse   HPI: Laura Melendez is a 74 y.o. RH-female with history of DM type 2, HTN, DDD lumbar spine, recent febrile illness; who was admitted on 03/30/15 with MS changes, incontinence and weakness RLE. 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 MRI brain done revealing subcortical punctate acute infarcts in the high, medial left frontal lobe and MRA without major intracranial occlusion. Dr. Aram Beecham consulted and stroke workup ongoing. Patient with resultant right hemiparesis with impairments in mobility. CIR recommended by MD in anticipation for extensive rehab needs.     Review of Systems  HENT: Negative for hearing loss.   Eyes: Negative for blurred vision and double vision.  Respiratory: Negative for cough and hemoptysis.   Cardiovascular: Negative for chest pain and palpitations.  Gastrointestinal: Negative for heartburn and nausea.  Genitourinary: Positive for urgency and frequency.  Musculoskeletal: Positive for joint pain (left shoulder with limited ROM).  Neurological: Positive for sensory change and focal weakness. Negative for headaches.  Psychiatric/Behavioral: The patient is nervous/anxious.       Past Medical History  Diagnosis Date  . Essential hypertension, benign   . Type 2 diabetes mellitus   . Osteoporosis 06/2008    Past Surgical History  Procedure Laterality Date  . Cholecystectomy    . Ankle surgery Left 2004    otif  . Abdominal hysterectomy    . Anterior and posterior repair N/A 03/11/2013    Procedure: ANTERIOR (CYSTOCELE) AND POSTERIOR REPAIR (RECTOCELE);  Surgeon: Linda Hedges, DO;  Location: [facility];  Service: Gynecology;  Laterality: N/A;  with sacrospinous ligament fixation bilateral  . Colonoscopy  2009    negative  . Cataract extraction w/phaco Right 12/27/2014    Procedure: CATARACT EXTRACTION PHACO  AND INTRAOCULAR LENS PLACEMENT RIGHT EYE;  Surgeon: Tonny Branch, MD;  Location: [facility];  Service: Ophthalmology;  Laterality: Right;  CDE:10.63  . Cataract extraction w/phaco Left 01/13/2015    Procedure: CATARACT EXTRACTION PHACO AND INTRAOCULAR LENS PLACEMENT LEFT EYE;  Surgeon: Tonny Branch, MD;  Location: [facility];  Service: Ophthalmology;  Laterality: Left;  CDE:18.87    Family History  Problem Relation Age of Onset  . Cancer Father     Liver  . Cancer Sister     Breast  . Diabetes Brother     Social History:   Married--husband retired and supportive.  Independent PTA without AD.  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 She reports that she quit smoking about 4 weeks ago. Her smoking use included Cigarettes. She has a 7.5 pack-year smoking history. She does not have any smokeless tobacco history on file. She reports that she does not drink alcohol or use illicit drugs.    Allergies: No Known Allergies    Medications Prior to Admission  Medication Sig Dispense Refill  . aspirin EC 81 MG tablet Take 81 mg by mouth daily.    . Calcium Carb-Cholecalciferol (CALCIUM 600/VITAMIN D3) 600-800 MG-UNIT TABS Take 1 tablet by mouth 2 (two) times daily with a meal.    . CINNAMON PO Take 1,000 mg by mouth daily.    Marland Kitchen CRANBERRY PO Take 84 mg by mouth daily.    Marland Kitchen lisinopril (PRINIVIL,ZESTRIL) 2.5 MG tablet Take 1 tablet (2.5 mg total) by mouth daily. 90 tablet 3  . metFORMIN (GLUCOPHAGE) 500 MG tablet Take 1 tablet (500 mg total) by mouth  2 (two) times daily with a meal. 180 tablet 3  . metoprolol (LOPRESSOR) 50 MG tablet Take 1 tablet (50 mg total) by mouth 2 (two) times daily. 180 tablet 3  . Multiple Vitamin (MULTIVITAMIN WITH MINERALS) TABS Take 1 tablet by mouth daily.    . naproxen sodium (ANAPROX) 220 MG tablet Take 220 mg by mouth daily as needed (pain).    Marland Kitchen NIFEdipine (PROCARDIA XL/ADALAT-CC) 60 MG 24 hr tablet Take 1 tablet (60 mg total) by mouth daily. 90 tablet 3  . Omega-3  Fatty Acids (FISH OIL CONCENTRATE) 300 MG CAPS Take 1 capsule by mouth daily.    . pravastatin (PRAVACHOL) 20 MG tablet Take 1 tablet (20 mg total) by mouth daily. (Patient taking differently: Take 20 mg by mouth at bedtime. ) 30 tablet 2  . raloxifene (EVISTA) 60 MG tablet Take 1 tablet (60 mg total) by mouth daily. 90 tablet 3  . alendronate (FOSAMAX) 70 MG tablet Take 1 tablet (70 mg total) by mouth every 7 (seven) days. 12 tablet 3    Home:    Functional History:   Functional Status:  Mobility:          ADL:    Cognition: Cognition Orientation Level: Oriented to person, Oriented to place, Oriented to time, Disoriented to situation    Blood pressure 147/54, pulse 61, temperature 98.2 F (36.8 C), temperature source Oral, resp. rate 18, height 5\' 1"  (1.549 m), weight 45.36 kg (100 lb), SpO2 95 %. Physical Exam  Nursing note and vitals reviewed. Constitutional: She is oriented to person, place, and time. She appears well-developed and well-nourished.  Thin elderly female, distraught and labile throughout the exam.   HENT:  Head: Normocephalic and atraumatic.  Eyes: Conjunctivae are normal. Pupils are equal, round, and reactive to light.  Neck: Normal range of motion. Neck supple.  Cardiovascular: Normal rate and regular rhythm.   Respiratory: Effort normal and breath sounds normal.  GI: Soft. Bowel sounds are normal. She exhibits no distension. There is no tenderness.  Musculoskeletal: She exhibits no edema or tenderness.  Limited ROM left shoulder with pain with active and PROM.   Neurological: She is alert and oriented to person, place, and time.  Soft voice but able to answer questions without difficulty. Inattention to RUE.  Right hemiparesis with ataxic movements.   Skin: Skin is warm and dry.  Psychiatric: Her mood appears anxious.    Results for orders placed or performed during the hospital encounter of 03/30/15 (from the past 24 hour(s))  Ethanol     Status:  None   Collection Time: 03/30/15 12:12 PM  Result Value Ref Range   Alcohol, Ethyl (B) <5 0 - 9 mg/dL  Protime-INR     Status: None   Collection Time: 03/30/15 12:12 PM  Result Value Ref Range   Prothrombin Time 13.6 11.6 - 15.2 seconds   INR 1.03 0.00 - 1.49  APTT     Status: None   Collection Time: 03/30/15 12:12 PM  Result Value Ref Range   aPTT 30 24 - 37 seconds  CBC     Status: None   Collection Time: 03/30/15 12:12 PM  Result Value Ref Range   WBC 6.2 4.0 - 10.5 K/uL   RBC 5.02 3.87 - 5.11 MIL/uL   Hemoglobin 13.4 12.0 - 15.0 g/dL   HCT 40.9 36.0 - 46.0 %   MCV 81.5 78.0 - 100.0 fL   MCH 26.7 26.0 - 34.0 pg   MCHC 32.8  30.0 - 36.0 g/dL   RDW 14.4 11.5 - 15.5 %   Platelets 204 150 - 400 K/uL  Differential     Status: None   Collection Time: 03/30/15 12:12 PM  Result Value Ref Range   Neutrophils Relative % 58 43 - 77 %   Neutro Abs 3.6 1.7 - 7.7 K/uL   Lymphocytes Relative 33 12 - 46 %   Lymphs Abs 2.1 0.7 - 4.0 K/uL   Monocytes Relative 7 3 - 12 %   Monocytes Absolute 0.4 0.1 - 1.0 K/uL   Eosinophils Relative 1 0 - 5 %   Eosinophils Absolute 0.1 0.0 - 0.7 K/uL   Basophils Relative 1 0 - 1 %   Basophils Absolute 0.1 0.0 - 0.1 K/uL  Comprehensive metabolic panel     Status: Abnormal   Collection Time: 03/30/15 12:12 PM  Result Value Ref Range   Sodium 142 135 - 145 mmol/L   Potassium 3.9 3.5 - 5.1 mmol/L   Chloride 105 96 - 112 mmol/L   CO2 26 19 - 32 mmol/L   Glucose, Bld 133 (H) 70 - 99 mg/dL   BUN 13 6 - 23 mg/dL   Creatinine, Ser 0.58 0.50 - 1.10 mg/dL   Calcium 9.7 8.4 - 10.5 mg/dL   Total Protein 7.4 6.0 - 8.3 g/dL   Albumin 4.0 3.5 - 5.2 g/dL   AST 22 0 - 37 U/L   ALT 16 0 - 35 U/L   Alkaline Phosphatase 40 39 - 117 U/L   Total Bilirubin 0.5 0.3 - 1.2 mg/dL   GFR calc non Af Amer 89 (L) >90 mL/min   GFR calc Af Amer >90 >90 mL/min   Anion gap 11 5 - 15  I-stat troponin, ED (not at [facility], [facility])     Status: None   Collection Time: 03/30/15 12:15 PM    Result Value Ref Range   Troponin i, poc 0.00 0.00 - 0.08 ng/mL   Comment 3          I-Stat Chem 8, ED     Status: Abnormal   Collection Time: 03/30/15 12:17 PM  Result Value Ref Range   Sodium 142 135 - 145 mmol/L   Potassium 3.9 3.5 - 5.1 mmol/L   Chloride 105 96 - 112 mmol/L   BUN 15 6 - 23 mg/dL   Creatinine, Ser 0.60 0.50 - 1.10 mg/dL   Glucose, Bld 132 (H) 70 - 99 mg/dL   Calcium, Ion 1.12 (L) 1.13 - 1.30 mmol/L   TCO2 21 0 - 100 mmol/L   Hemoglobin 14.6 12.0 - 15.0 g/dL   HCT 43.0 36.0 - 46.0 %  Urine Drug Screen     Status: None   Collection Time: 03/30/15  3:20 PM  Result Value Ref Range   Opiates NONE DETECTED NONE DETECTED   Cocaine NONE DETECTED NONE DETECTED   Benzodiazepines NONE DETECTED NONE DETECTED   Amphetamines NONE DETECTED NONE DETECTED   Tetrahydrocannabinol NONE DETECTED NONE DETECTED   Barbiturates NONE DETECTED NONE DETECTED  Urinalysis, Routine w reflex microscopic     Status: Abnormal   Collection Time: 03/30/15  3:20 PM  Result Value Ref Range   Color, Urine YELLOW YELLOW   APPearance CLOUDY (A) CLEAR   Specific Gravity, Urine 1.015 1.005 - 1.030   pH 8.0 5.0 - 8.0   Glucose, UA NEGATIVE NEGATIVE mg/dL   Hgb urine dipstick NEGATIVE NEGATIVE   Bilirubin Urine NEGATIVE NEGATIVE   Ketones, ur NEGATIVE  NEGATIVE mg/dL   Protein, ur NEGATIVE NEGATIVE mg/dL   Urobilinogen, UA 0.2 0.0 - 1.0 mg/dL   Nitrite NEGATIVE NEGATIVE   Leukocytes, UA NEGATIVE NEGATIVE  CBG monitoring, ED     Status: Abnormal   Collection Time: 03/30/15  4:46 PM  Result Value Ref Range   Glucose-Capillary 112 (H) 70 - 99 mg/dL  Glucose, capillary     Status: Abnormal   Collection Time: 03/30/15  9:33 PM  Result Value Ref Range   Glucose-Capillary 124 (H) 70 - 99 mg/dL  Glucose, capillary     Status: Abnormal   Collection Time: 03/31/15  7:36 AM  Result Value Ref Range   Glucose-Capillary 109 (H) 70 - 99 mg/dL   Ct Head Wo Contrast  03/30/2015   ADDENDUM REPORT:  03/30/2015 15:35  ADDENDUM: Critical Value/emergent results were called by telephone at the time of interpretation on 03/30/2015 at 3:35 pm to Dr. Aram Beecham, neurology , who verbally acknowledged these results.   Electronically Signed   By: Lowella Grip III M.D.   On: 03/30/2015 15:35   03/30/2015   CLINICAL DATA:  Acute onset right lower extremity weakness  EXAM: CT HEAD WITHOUT CONTRAST  TECHNIQUE: Contiguous axial images were obtained from the base of the skull through the vertex without intravenous contrast.  COMPARISON:  January 29, 2015  FINDINGS: There is mild diffuse atrophy. There is no intracranial mass hemorrhage, extra-axial fluid collection, or midline shift. There is evidence of a prior small infarct in the mid left cerebellum, stable. There is small vessel disease throughout the periventricular white matter bilaterally. There is a new area of decreased attenuation in the posterior right corona radiata which may represent an acute/recent small white matter infarct in this area. No other new gray-white compartment lesion identified. There is small vessel disease in the anterior limb of the left internal capsule, stable. There is also a prior small infarct in the right thalamus, stable. Bony calvarium appears intact. The mastoid air cells are clear. The middle cerebral arteries have symmetric attenuation and appear stable bilaterally.  IMPRESSION: Suspect new small white matter infarct in the right corona radiata. Extensive supratentorial/periventricular small vessel disease remains. Prior small infarct in the anterior limb of the left internal capsule as well as in the right thalamus and left cerebellum remains stable. No hemorrhage or mass effect.  Electronically Signed: By: Lowella Grip III M.D. On: 03/30/2015 15:25   Ct Angio Neck W/cm &/or Wo/cm  03/31/2015   CLINICAL DATA:  74 year old female with small acute left frontal lobe infarcts diagnosed on MRI during evaluation of right lower  extremity weakness. Initial encounter.  EXAM: CT ANGIOGRAPHY NECK  TECHNIQUE: Multidetector CT imaging of the neck was performed using the standard protocol during bolus administration of intravenous contrast. Multiplanar CT image reconstructions and MIPs were obtained to evaluate the vascular anatomy. Carotid stenosis measurements (when applicable) are obtained utilizing NASCET criteria, using the distal internal carotid diameter as the denominator.  CONTRAST:  92mL OMNIPAQUE IOHEXOL 350 MG/ML SOLN  COMPARISON:  Brain MRI and intracranial MRA 03/30/2015, and earlier  FINDINGS: Aortic arch: The aortic arch is not entirely included on these images. There is a bovine arch configuration. There is moderate to severe arch atherosclerosis including mural plaque or thrombus up to 8 mm in the distal arch.  Right carotid system: No brachiocephalic or right CCA origin stenosis. Mildly tortuous right CCA with a partially retropharyngeal course. Soft and calcified plaque at the right carotid bifurcation but no right  ICA origin stenosis. Tortuous right ICA with a partially retropharyngeal course, otherwise negative to the skullbase. Visible right ICA siphon is patent with calcified plaque.  Left carotid system: Calcified plaque at the left CCA origin resulting in up to 50 % stenosis with respect to the distal vessel (coronal image 144). Beyond its origin the left CCA is mildly tortuous. There is calcified plaque at the left carotid bifurcation and involving the posterior left ICA bulb but not resulting in left ICA origin stenosis. Tortuous left ICA just beyond the bulb with a mildly kinked appearance. Otherwise negative to the skullbase. Visible left ICA siphon is patent with calcified plaque.  Vertebral arteries:  Calcified plaque at the right subclavian artery origin resulting in stenosis of less than 50 % with respect to the distal vessel . Calcified plaque at the right vertebral artery origin with mild stenosis. Otherwise  negative right vertebral artery to the vertebrobasilar junction. Normal right PICA origin.  Calcified plaque in the left subclavian artery origin with stenosis up to 60 % with respect to the distal vessel. Mild calcified plaque at the left vertebral artery origin with mild stenosis. The proximal left vertebral artery is tortuous. The vertebral arteries are codominant. The left is otherwise negative to the vertebrobasilar junction. Dominant appearing left AICA.  Skeleton: Largely absent dentition. Degenerative changes in the cervical spine. Thoracic scoliosis. No acute osseous abnormality identified. Mild bubbly opacity in the right posterior ethmoid air cells. Other Visualized paranasal sinuses and mastoids are clear.  Other neck: Thyromegaly with bilateral less than 15 mm hypodense thyroid nodules. Larynx, pharynx, parapharyngeal spaces and sublingual space are within normal limits. Negative retropharyngeal space aside from a retropharyngeal course of the carotids, more so the right. Submandibular and parotid glands are within normal limits. Negative orbits soft tissues.  No cervical lymphadenopathy.  Dependent atelectasis in the left upper lobe. No superior mediastinal lymphadenopathy.  IMPRESSION: 1. 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 No other Hemodynamically significant stenosis of the carotid or vertebral arteries in the neck. 2. 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   Electronically Signed   By: Genevie Ann M.D.   On: 03/31/2015 07:21   Mr Jodene Nam Head Wo Contrast  03/30/2015   CLINICAL DATA:  Right leg weakness.  EXAM: MRI HEAD WITHOUT CONTRAST  MRA HEAD WITHOUT CONTRAST  TECHNIQUE: Multiplanar, multiecho pulse sequences of the brain and surrounding structures were obtained without intravenous contrast. Angiographic images of the head were obtained using MRA technique without contrast.  COMPARISON:  Head  CT 03/30/2015 and MRI 06/21/2005  FINDINGS: MRI HEAD FINDINGS  There are several punctate acute infarcts in the high, medial left frontal lobe which are predominantly subcortical in location. There is no evidence of intracranial hemorrhage, mass, midline shift, or extra-axial fluid collection. There is mild generalized cerebral atrophy. Patchy and confluent T2 hyperintensities in the periventricular greater than subcortical cerebral white matter have increased from the prior MRI and are nonspecific but compatible with moderate chronic small vessel ischemic disease. Chronic lacunar infarcts are noted in the left corona radiata and right greater than left thalami. A small, chronic left cerebellar infarct is also noted.  Prior bilateral cataract extraction is noted. Paranasal sinuses mastoid air cells are clear. Major intracranial vascular flow voids are preserved.  MRA HEAD FINDINGS  Images are mildly to moderately motion degraded. Visualized distal vertebral arteries are patent  and codominant without stenosis. Right PICA origin is patent. Left PICA is not identified. AICA and SCA origins are patent. Basilar artery is patent without stenosis. There is a fetal origin of the left PCA. There is moderate right P1 segment stenosis.  Internal carotid arteries are patent from skullbase to carotid termini without evidence of significant stenosis. M1 and A1 segments are patent without stenosis. A2 segments and MCA branch vessels are suboptimally evaluated due to motion artifact but appear patent without significant proximal stenosis identified. No intracranial aneurysm is identified.  IMPRESSION: 1. Punctate acute left frontal lobe infarcts. 2. Moderate chronic small vessel ischemic disease and chronic lacunar infarcts as above. 3. No major intracranial arterial occlusion or significant proximal anterior circulation stenosis. Moderate proximal right PCA stenosis.   Electronically Signed   By: Logan Bores   On: 03/30/2015  16:52   Mr Brain Wo Contrast  03/30/2015   CLINICAL DATA:  Right leg weakness.  EXAM: MRI HEAD WITHOUT CONTRAST  MRA HEAD WITHOUT CONTRAST  TECHNIQUE: Multiplanar, multiecho pulse sequences of the brain and surrounding structures were obtained without intravenous contrast. Angiographic images of the head were obtained using MRA technique without contrast.  COMPARISON:  Head CT 03/30/2015 and MRI 06/21/2005  FINDINGS: MRI HEAD FINDINGS  There are several punctate acute infarcts in the high, medial left frontal lobe which are predominantly subcortical in location. There is no evidence of intracranial hemorrhage, mass, midline shift, or extra-axial fluid collection. There is mild generalized cerebral atrophy. Patchy and confluent T2 hyperintensities in the periventricular greater than subcortical cerebral white matter have increased from the prior MRI and are nonspecific but compatible with moderate chronic small vessel ischemic disease. Chronic lacunar infarcts are noted in the left corona radiata and right greater than left thalami. A small, chronic left cerebellar infarct is also noted.  Prior bilateral cataract extraction is noted. Paranasal sinuses mastoid air cells are clear. Major intracranial vascular flow voids are preserved.  MRA HEAD FINDINGS  Images are mildly to moderately motion degraded. Visualized distal vertebral arteries are patent and codominant without stenosis. Right PICA origin is patent. Left PICA is not identified. AICA and SCA origins are patent. Basilar artery is patent without stenosis. There is a fetal origin of the left PCA. There is moderate right P1 segment stenosis.  Internal carotid arteries are patent from skullbase to carotid termini without evidence of significant stenosis. M1 and A1 segments are patent without stenosis. A2 segments and MCA branch vessels are suboptimally evaluated due to motion artifact but appear patent without significant proximal stenosis identified. No  intracranial aneurysm is identified.  IMPRESSION: 1. Punctate acute left frontal lobe infarcts. 2. Moderate chronic small vessel ischemic disease and chronic lacunar infarcts as above. 3. No major intracranial arterial occlusion or significant proximal anterior circulation stenosis. Moderate proximal right PCA stenosis.   Electronically Signed   By: Logan Bores   On: 03/30/2015 16:52    Assessment/Plan: Diagnosis: left frontal infarct 1. Does the need for close, 24 hr/day medical supervision in concert with the patient's rehab needs make it unreasonable for this patient to be served in a less intensive setting? Yes 2. Co-Morbidities requiring supervision/potential complications: htn, dm,  3. Due to bladder management, bowel management, safety, skin/wound care, disease management, medication administration, pain management and patient education, does the patient require 24 hr/day rehab nursing? Yes 4. Does the patient require coordinated care of a physician, rehab nurse, PT (1-2 hrs/day, 5 days/week), OT (1-2 hrs/day, 5 days/week) and SLP (  1-2 hrs/day, 5 days/week) to address physical and functional deficits in the context of the above medical diagnosis(es)? Yes Addressing deficits in the following areas: balance, endurance, locomotion, strength, transferring, bowel/bladder control, bathing, dressing, feeding, grooming, toileting, cognition and psychosocial support 5. Can the patient actively participate in an intensive therapy program of at least 3 hrs of therapy per day at least 5 days per week? Yes 6. The potential for patient to make measurable gains while on inpatient rehab is good 7. Anticipated functional outcomes upon discharge from inpatient rehab are supervision and min assist  with PT, supervision and min assist with OT, modified independent, supervision and min assist with SLP. 8. Estimated rehab length of stay to reach the above functional goals is: potentially 10-20 days 9. Does the  patient have adequate social supports and living environment to accommodate these discharge functional goals? Yes 10. Anticipated D/C setting: Home 11. Anticipated post D/C treatments: HH therapy and Outpatient therapy 12. Overall Rehab/Functional Prognosis: excellent  RECOMMENDATIONS: This patient's condition is appropriate for continued rehabilitative care in the following setting: CIR Patient has agreed to participate in recommended program. Yes Note that insurance prior authorization may be required for reimbursement for recommended care.  Comment: We are still early in admission, but given neuro deficits, it is likely the patient would benefit from an inpatient rehab stay. Rehab Admissions Coordinator to follow up.  Thanks,  Meredith Staggers, MD, Mellody Drown     03/31/2015

## 2015-03-31 NOTE — Evaluation (Signed)
Occupational Therapy Evaluation Patient Details Name: Laura Melendez MRN: [id number] DOB: [birth year] Today's Date: 03/31/2015    History of Present Illness This 74 y.o. female amitted with MS changes incontinence and Rt LE weakness.  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  MRI of head showed subcortical punctate acute infarcts high medial Lt frontal lobe.  PMH includes:  DM; HTN; DDD lumbar spine   Clinical Impression   Pt admitted with above. She demonstrates the below listed deficits and will benefit from continued OT to maximize safety and independence with BADLs.    Pt presents with Rt hemiplegia, motor planning deficits, cognitive deficits, impaired balance and decreased trunk control.  She currently, requires mod A overall for ADLs and mod A for functional transfers.  Recommend CIR to allow her to regain max independence with ADLs and improve Rt UE functional use       Follow Up Recommendations  CIR    Equipment Recommendations  None recommended by OT (defer to CIR )    Recommendations for Other Services       Precautions / Restrictions Precautions Precautions: Fall      Mobility Bed Mobility Overal bed mobility: Needs Assistance Bed Mobility: Supine to Sit;Sit to Supine     Supine to sit: Max assist Sit to supine: Max assist   General bed mobility comments: Pt requires assist with moving Rt LE off bed and with lifting trunk from bed.  Assist to lower trunk and move LEs onto bed   Transfers Overall transfer level: Needs assistance   Transfers: Sit to/from [employer] Transfers Sit to Stand: Mod assist   Squat pivot transfers: Mod assist     General transfer comment: facilitation at pelvis and assist to pivot Rt LE     Balance Overall balance assessment: Needs assistance Sitting-balance support: Feet supported Sitting balance-Leahy Scale: Poor Sitting balance - Comments: requires min A for static sitting balance.  Loses  balance to Rt  Postural control: Right lateral lean Standing balance support: Single extremity supported Standing balance-Leahy Scale: Poor                              ADL Overall ADL's : Needs assistance/impaired Eating/Feeding: Sitting;Bed level   Grooming: Wash/dry hands;Wash/dry face;Oral care;Brushing hair;Minimal assistance;Sitting   Upper Body Bathing: Moderate assistance;Sitting   Lower Body Bathing: Maximal assistance;Sit to/from stand   Upper Body Dressing : Maximal assistance;Sitting   Lower Body Dressing: Total assistance;Sit to/from stand   Toilet Transfer: Moderate assistance;Stand-pivot;BSC   Toileting- Clothing Manipulation and Hygiene: Total assistance;Sit to/from stand       Functional mobility during ADLs: Moderate assistance General ADL Comments: Pt incontinent of urine.  Assisted her with peri care     Vision Additional Comments: Pt reports she used to wear glasses until she had cataracts removed.  Unable to complete visual assessment due to eval abbreviated due to MD interruptions and pt taken for testing    Perception     Praxis Praxis Praxis tested?: Deficits Deficits: Initiation;Ideomotor    Pertinent Vitals/Pain Pain Assessment: No/denies pain     Hand Dominance Right   Extremity/Trunk Assessment Upper Extremity Assessment Upper Extremity Assessment: RUE deficits/detail;LUE deficits/detail RUE Deficits / Details: Pt with minimal ability to activate movement all muscle groups, but unable to sustain.   Able to perform ~80* elbow flexion with increased time.  Appears to have motor planning deficits  RUE Coordination: decreased  fine motor;decreased gross motor LUE Deficits / Details: Pt reports pain at times Lt shoulder.  AROM WFL, but delayed responses when asking her to move    Lower Extremity Assessment Lower Extremity Assessment: Defer to PT evaluation   Cervical / Trunk Assessment Cervical / Trunk Assessment: Other  exceptions Cervical / Trunk Exceptions: Pt with trunk weakness,  Maintains posterior pelvic tilt with thoracic flexion    Communication Communication Communication: No difficulties   Cognition Arousal/Alertness: Awake/alert Behavior During Therapy: Flat affect Overall Cognitive Status: Impaired/Different from baseline Area of Impairment: Attention;Following commands;Problem solving;Awareness;Safety/judgement   Current Attention Level: Sustained   Following Commands: Follows one step commands consistently;Follows one step commands with increased time Safety/Judgement: Decreased awareness of safety;Decreased awareness of deficits Awareness: Intellectual Problem Solving: Decreased initiation;Slow processing;Difficulty sequencing;Requires verbal cues;Requires tactile cues General Comments: Pt is very slow to initiate movement and conversation   General Comments       Exercises       Shoulder Instructions      Home Living Family/patient expects to be discharged to:: Private residence Living Arrangements: Spouse/significant other Available Help at Discharge: Family;Available 24 hours/day Type of Home: House Home Access: Stairs to enter [employer] of Steps: 2 Entrance Stairs-Rails: Left;Right Home Layout: One level     Bathroom Shower/Tub: Occupational psychologist: Handicapped height     Home Equipment: Environmental consultant - 2 wheels;Grab bars - toilet;Grab bars - tub/shower      Lives With: Spouse    Prior Functioning/Environment Level of Independence: Independent        Comments: Still drives, independent with IADL's    OT Diagnosis: Generalized weakness;Cognitive deficits;Hemiplegia dominant side;Apraxia   OT Problem List: Decreased strength;Decreased activity tolerance;Impaired balance (sitting and/or standing);Decreased coordination;Decreased cognition;Decreased safety awareness;Decreased knowledge of use of DME or AE;Impaired tone;Impaired UE functional  use   OT Treatment/Interventions: Self-care/ADL training;Neuromuscular education;DME and/or AE instruction;Therapeutic activities;Cognitive remediation/compensation;Patient/family education;Balance training    OT Goals(Current goals can be found in the care plan section) Acute Rehab OT Goals Patient Stated Goal: to go home  OT Goal Formulation: With patient/family Time For Goal Achievement: 04/14/15 Potential to Achieve Goals: Good ADL Goals Pt Will Perform Eating: with modified independence;sitting Pt Will Perform Grooming: with set-up;sitting Pt Will Perform Upper Body Bathing: with supervision;sitting Pt Will Perform Lower Body Bathing: with min assist;sit to/from stand Pt Will Transfer to Toilet: with min assist;ambulating;regular height toilet;grab bars Pt Will Perform Toileting - Clothing Manipulation and hygiene: with min assist;sit to/from stand Pt/caregiver will Perform Home Exercise Program: Increased ROM;Right Upper extremity;With Supervision;With written HEP provided Additional ADL Goal #1: Pt will maintain EOB sitting with supervision while performing simple ADL tasks  Additional ADL Goal #2: Pt will participate in further visual assessment   OT Frequency: Min 3X/week   Barriers to D/C:            Co-evaluation              End of Session Nurse Communication: Mobility status  Activity Tolerance: Patient tolerated treatment well Patient left: in bed;with call bell/phone within reach;Other (comment) (transporters in room )   Time: 1438-1510 OT Time Calculation (min): 32 min Charges:  OT General Charges $OT Visit: 1 Procedure OT Evaluation $Initial OT Evaluation Tier I: 1 Procedure OT Treatments $Self Care/Home Management : 8-22 mins G-Codes:    Destini Cambre M 2015/04/18, 3:32 PM

## 2015-03-31 NOTE — Progress Notes (Signed)
Rehab admissions - I met with pt and her dtr in follow up to rehab MD consult to explain the possibility of inpatient rehab. Pt was quiet throughout the discussion but agreeable to consider inpatient rehab. Pt's dtr is in support of CIR as well. I explained that pt has [employer] and we would need insurance authorization. I also shared that there will be copays associated with inpatient rehab. Pt was hesitant about the financial aspect of this and I will share the specific details of her insurance plan later. Further questions were answered and informational brochures were given.  I noted that therapy evals have been ordered. I will need completed OT and PT evaluations to submit to insurance for pre-approval.  I will open pt's case with [facility] and will need therapy evaluations to submit as well.  I will share update tomorrow. Thanks.  Nanetta Batty, PT Rehabilitation Admissions Coordinator [phone]

## 2015-03-31 NOTE — Progress Notes (Signed)
  Echocardiogram 2D Echocardiogram has been performed.  Milica Gully FRANCES 03/31/2015, 4:07 PM

## 2015-03-31 NOTE — Consult Note (Addendum)
CONSULT NOTE   MRN : [id number]  Reason for Consult: stroke   History of Present Illness: 74 y/o female symptoms started 03/29/2015 family stated "she wasn't acting like herself being more quite.  He husband noted she urinated in the bed.  When she got up the next morning she noticed right leg and arm weakness.   On 02/01/2015 she was discharged on Levaquin PO s/p acute encephalopathy with negative blood cultures and negative CSF.  Past medical history includes: DM, hypertension, and osteoporosis.   She takes a daily statin, beta blocker and 81 mg aspirin.       Current Facility-Administered Medications  Medication Dose Route Frequency Provider Last Rate Last Dose  .  stroke: mapping our early stages of recovery book   Does not apply Once Samella Parr, NP      . aspirin EC tablet 325 mg  325 mg Oral Daily Nita Sells, MD   325 mg at 03/31/15 1119  . calcium-vitamin D (OSCAL WITH D) 500-200 MG-UNIT per tablet 1 tablet  1 tablet Oral BID WC Samella Parr, NP   1 tablet at 03/31/15 0759  . heparin injection 5,000 Units  5,000 Units Subcutaneous 3 times per day Samella Parr, NP   5,000 Units at 03/31/15 0520  . insulin aspart (novoLOG) injection 0-9 Units  0-9 Units Subcutaneous TID WC Jeryl Columbia, NP   0 Units at 03/31/15 0754  . lisinopril (PRINIVIL,ZESTRIL) tablet 2.5 mg  2.5 mg Oral Daily Samella Parr, NP   2.5 mg at 03/31/15 1119  . metoprolol (LOPRESSOR) tablet 50 mg  50 mg Oral BID Samella Parr, NP   50 mg at 03/31/15 1118  . multivitamin with minerals tablet 1 tablet  1 tablet Oral Daily Samella Parr, NP   1 tablet at 03/31/15 1118  . NIFEdipine (PROCARDIA-XL/ADALAT CC) 24 hr tablet 60 mg  60 mg Oral Daily Samella Parr, NP   60 mg at 03/31/15 1118  . omega-3 acid ethyl esters (LOVAZA) capsule 1 g  1 g Oral BID Samella Parr, NP   1 g at 03/31/15 1118  . pravastatin (PRAVACHOL) tablet 40 mg  40 mg Oral q1800 Thurnell Lose, MD      . raloxifene  (EVISTA) tablet 60 mg  60 mg Oral Daily Samella Parr, NP   60 mg at 03/31/15 1118  . senna-docusate (Senokot-S) tablet 1 tablet  1 tablet Oral QHS PRN Samella Parr, NP        Pt meds include: Statin :Yes Betablocker: Yes ASA: Yes Other anticoagulants/antiplatelets: none  Past Medical History  Diagnosis Date  . Essential hypertension, benign   . Type 2 diabetes mellitus   . Osteoporosis 06/2008    Past Surgical History  Procedure Laterality Date  . Cholecystectomy    . Ankle surgery Left 2004    otif  . Abdominal hysterectomy    . Anterior and posterior repair N/A 03/11/2013    Procedure: ANTERIOR (CYSTOCELE) AND POSTERIOR REPAIR (RECTOCELE);  Surgeon: Linda Hedges, DO;  Location: [facility];  Service: Gynecology;  Laterality: N/A;  with sacrospinous ligament fixation bilateral  . Colonoscopy  2009    negative  . Cataract extraction w/phaco Right 12/27/2014    Procedure: CATARACT EXTRACTION PHACO AND INTRAOCULAR LENS PLACEMENT RIGHT EYE;  Surgeon: Tonny Branch, MD;  Location: [facility];  Service: Ophthalmology;  Laterality: Right;  CDE:10.63  . Cataract extraction w/phaco Left 01/13/2015  Procedure: CATARACT EXTRACTION PHACO AND INTRAOCULAR LENS PLACEMENT LEFT EYE;  Surgeon: Tonny Branch, MD;  Location: [facility];  Service: Ophthalmology;  Laterality: Left;  CDE:18.87    Social History History  Substance Use Topics  . Smoking status: Former Smoker -- 0.25 packs/day for 30 years    Types: Cigarettes    Quit date: 02/27/2015  . Smokeless tobacco: Not on file  . Alcohol Use: No    Family History Family History  Problem Relation Age of Onset  . Cancer Father     Liver  . Cancer Sister     Breast  . Diabetes Brother     No Known Allergies   REVIEW OF SYSTEMS  General: [ ]  Weight loss, [ ]  Fever, [ ]  chills Neurologic: [ ]  Dizziness, [ ]  Blackouts, [ ]  Seizure [ ]  Stroke, [ ]  "Mini stroke", [ ]  Slurred speech, [ ]  Temporary blindness; [x ] weakness in arms or legs, [ ]   Hoarseness [ ]  Dysphagia Cardiac: [ ]  Chest pain/pressure, [ ]  Shortness of breath at rest [ ]  Shortness of breath with exertion, [ ]  Atrial fibrillation or irregular heartbeat  Vascular: [ ]  Pain in legs with walking, [ ]  Pain in legs at rest, [ ]  Pain in legs at night,  [ ]  Non-healing ulcer, [ ]  Blood clot in vein/DVT,   Pulmonary: [ ]  Home oxygen, [ ]  Productive cough, [ ]  Coughing up blood, [ ]  Asthma,  [ ]  Wheezing [ ]  COPD Musculoskeletal:  [ ]  Arthritis, [ ]  Low back pain, [ ]  Joint pain Hematologic: [ ]  Easy Bruising, [ ]  Anemia; [ ]  Hepatitis Gastrointestinal: [ ]  Blood in stool, [ ]  Gastroesophageal Reflux/heartburn, Urinary: [ ]  chronic Kidney disease, [ ]  on HD - [ ]  MWF or [ ]  TTHS, [ ]  Burning with urination, [ ]  Difficulty urinating Skin: [ ]  Rashes, [ ]  Wounds Psychological: [ ]  Anxiety, [x ] Depression  Physical Examination Filed Vitals:   03/31/15 0225 03/31/15 0400 03/31/15 0600 03/31/15 1000  BP: 156/51 145/71 147/54 127/62  Pulse: 61 62 61 80  Temp: 97.9 F (36.6 C) 97.9 F (36.6 C) 98.2 F (36.8 C) 99 F (37.2 C)  TempSrc: Oral Oral Oral Oral  Resp: 18 18 18 18   Height:      Weight:      SpO2: 95% 98% 95% 96%   Body mass index is 18.9 kg/(m^2).  General:  WDWN in NAD Gait: not tested as pt can't stand unassisted HENT: WNL, dentures Eyes: Pupils equal Pulmonary: normal non-labored breathing , without Rales, rhonchi,  wheezing Cardiac: RRR, without  Murmurs, rubs or gallops; No carotid bruits Abdomen: soft, NT, no masses Skin: no rashes, ulcers noted;  no Gangrene , no cellulitis; no open wounds;  Vascular Exam/Pulses:Palpable radial, femoral, popliteal and DP/PT pulses Musculoskeletal: no muscle wasting or atrophy; no edema  Neurologic: A&O X 3; Appropriate Affect ; SENSATION: normal; MOTOR FUNCTION: 5/5 Left upper and lower extremity. Trace right upper and lower extremity muscle activity; Speech is fluent/normal Psychiatric: Judgment intact, Mood &  affect appropriate for pt's clinical situation Lymph : No Axillary, or Inguinal lymphadenopathy , mild L cerv LAD   Significant Diagnostic Studies: CBC Lab Results  Component Value Date   WBC 6.2 03/30/2015   HGB 14.6 03/30/2015   HCT 43.0 03/30/2015   MCV 81.5 03/30/2015   PLT 204 03/30/2015    BMET    Component Value Date/Time   NA 142 03/30/2015 1217  NA 142 02/21/2015 0810   K 3.9 03/30/2015 1217   CL 105 03/30/2015 1217   CO2 26 03/30/2015 1212   GLUCOSE 132* 03/30/2015 1217   GLUCOSE 130* 02/21/2015 0810   BUN 15 03/30/2015 1217   BUN 16 02/21/2015 0810   CREATININE 0.60 03/30/2015 1217   CREATININE 0.66 09/06/2014 0925   CALCIUM 9.7 03/30/2015 1212   GFRNONAA 89* 03/30/2015 1212   GFRAA >90 03/30/2015 1212   Estimated Creatinine Clearance: 44.2 mL/min (by C-G formula based on Cr of 0.6).  COAG Lab Results  Component Value Date   INR 1.07 03/31/2015   INR 1.03 03/30/2015   Radiology: Ct Head Wo Contrast  03/30/2015   ADDENDUM REPORT: 03/30/2015 15:35  ADDENDUM: Critical Value/emergent results were called by telephone at the time of interpretation on 03/30/2015 at 3:35 pm to Dr. Aram Beecham, neurology , who verbally acknowledged these results.   Electronically Signed   By: Lowella Grip III M.D.   On: 03/30/2015 15:35   03/30/2015   CLINICAL DATA:  Acute onset right lower extremity weakness  EXAM: CT HEAD WITHOUT CONTRAST  TECHNIQUE: Contiguous axial images were obtained from the base of the skull through the vertex without intravenous contrast.  COMPARISON:  January 29, 2015  FINDINGS: There is mild diffuse atrophy. There is no intracranial mass hemorrhage, extra-axial fluid collection, or midline shift. There is evidence of a prior small infarct in the mid left cerebellum, stable. There is small vessel disease throughout the periventricular white matter bilaterally. There is a new area of decreased attenuation in the posterior right corona radiata which may  represent an acute/recent small white matter infarct in this area. No other new gray-white compartment lesion identified. There is small vessel disease in the anterior limb of the left internal capsule, stable. There is also a prior small infarct in the right thalamus, stable. Bony calvarium appears intact. The mastoid air cells are clear. The middle cerebral arteries have symmetric attenuation and appear stable bilaterally.  IMPRESSION: Suspect new small white matter infarct in the right corona radiata. Extensive supratentorial/periventricular small vessel disease remains. Prior small infarct in the anterior limb of the left internal capsule as well as in the right thalamus and left cerebellum remains stable. No hemorrhage or mass effect.  Electronically Signed: By: Lowella Grip III M.D. On: 03/30/2015 15:25   Ct Angio Neck W/cm &/or Wo/cm  03/31/2015   CLINICAL DATA:  74 year old female with small acute left frontal lobe infarcts diagnosed on MRI during evaluation of right lower extremity weakness. Initial encounter.  EXAM: CT ANGIOGRAPHY NECK  TECHNIQUE: Multidetector CT imaging of the neck was performed using the standard protocol during bolus administration of intravenous contrast. Multiplanar CT image reconstructions and MIPs were obtained to evaluate the vascular anatomy. Carotid stenosis measurements (when applicable) are obtained utilizing NASCET criteria, using the distal internal carotid diameter as the denominator.  CONTRAST:  59mL OMNIPAQUE IOHEXOL 350 MG/ML SOLN  COMPARISON:  Brain MRI and intracranial MRA 03/30/2015, and earlier  FINDINGS: Aortic arch: The aortic arch is not entirely included on these images. There is a bovine arch configuration. There is moderate to severe arch atherosclerosis including mural plaque or thrombus up to 8 mm in the distal arch.  Right carotid system: No brachiocephalic or right CCA origin stenosis. Mildly tortuous right CCA with a partially retropharyngeal  course. Soft and calcified plaque at the right carotid bifurcation but no right ICA origin stenosis. Tortuous right ICA with a partially retropharyngeal course, otherwise negative  to the skullbase. Visible right ICA siphon is patent with calcified plaque.  Left carotid system: Calcified plaque at the left CCA origin resulting in up to 50 % stenosis with respect to the distal vessel (coronal image 144). Beyond its origin the left CCA is mildly tortuous. There is calcified plaque at the left carotid bifurcation and involving the posterior left ICA bulb but not resulting in left ICA origin stenosis. Tortuous left ICA just beyond the bulb with a mildly kinked appearance. Otherwise negative to the skullbase. Visible left ICA siphon is patent with calcified plaque.  Vertebral arteries:  Calcified plaque at the right subclavian artery origin resulting in stenosis of less than 50 % with respect to the distal vessel . Calcified plaque at the right vertebral artery origin with mild stenosis. Otherwise negative right vertebral artery to the vertebrobasilar junction. Normal right PICA origin.  Calcified plaque in the left subclavian artery origin with stenosis up to 60 % with respect to the distal vessel. Mild calcified plaque at the left vertebral artery origin with mild stenosis. The proximal left vertebral artery is tortuous. The vertebral arteries are codominant. The left is otherwise negative to the vertebrobasilar junction. Dominant appearing left AICA.  Skeleton: Largely absent dentition. Degenerative changes in the cervical spine. Thoracic scoliosis. No acute osseous abnormality identified. Mild bubbly opacity in the right posterior ethmoid air cells. Other Visualized paranasal sinuses and mastoids are clear.  Other neck: Thyromegaly with bilateral less than 15 mm hypodense thyroid nodules. Larynx, pharynx, parapharyngeal spaces and sublingual space are within normal limits. Negative retropharyngeal space aside from a  retropharyngeal course of the carotids, more so the right. Submandibular and parotid glands are within normal limits. Negative orbits soft tissues.  No cervical lymphadenopathy.  Dependent atelectasis in the left upper lobe. No superior mediastinal lymphadenopathy.  IMPRESSION: 1. 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 No other Hemodynamically significant stenosis of the carotid or vertebral arteries in the neck. 2. 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   Electronically Signed   By: Genevie Ann M.D.   On: 03/31/2015 07:21   Mr Jodene Nam Head Wo Contrast  03/30/2015   CLINICAL DATA:  Right leg weakness.  EXAM: MRI HEAD WITHOUT CONTRAST  MRA HEAD WITHOUT CONTRAST  TECHNIQUE: Multiplanar, multiecho pulse sequences of the brain and surrounding structures were obtained without intravenous contrast. Angiographic images of the head were obtained using MRA technique without contrast.  COMPARISON:  Head CT 03/30/2015 and MRI 06/21/2005  FINDINGS: MRI HEAD FINDINGS  There are several punctate acute infarcts in the high, medial left frontal lobe which are predominantly subcortical in location. There is no evidence of intracranial hemorrhage, mass, midline shift, or extra-axial fluid collection. There is mild generalized cerebral atrophy. Patchy and confluent T2 hyperintensities in the periventricular greater than subcortical cerebral white matter have increased from the prior MRI and are nonspecific but compatible with moderate chronic small vessel ischemic disease. Chronic lacunar infarcts are noted in the left corona radiata and right greater than left thalami. A small, chronic left cerebellar infarct is also noted.  Prior bilateral cataract extraction is noted. Paranasal sinuses mastoid air cells are clear. Major intracranial vascular flow voids are preserved.  MRA HEAD FINDINGS  Images are mildly to moderately motion  degraded. Visualized distal vertebral arteries are patent and codominant without stenosis. Right PICA origin is patent. Left PICA is not  identified. AICA and SCA origins are patent. Basilar artery is patent without stenosis. There is a fetal origin of the left PCA. There is moderate right P1 segment stenosis.  Internal carotid arteries are patent from skullbase to carotid termini without evidence of significant stenosis. M1 and A1 segments are patent without stenosis. A2 segments and MCA branch vessels are suboptimally evaluated due to motion artifact but appear patent without significant proximal stenosis identified. No intracranial aneurysm is identified.  IMPRESSION: 1. Punctate acute left frontal lobe infarcts. 2. Moderate chronic small vessel ischemic disease and chronic lacunar infarcts as above. 3. No major intracranial arterial occlusion or significant proximal anterior circulation stenosis. Moderate proximal right PCA stenosis.   Electronically Signed   By: Logan Bores   On: 03/30/2015 16:52   Mr Brain Wo Contrast  03/30/2015   CLINICAL DATA:  Right leg weakness.  EXAM: MRI HEAD WITHOUT CONTRAST  MRA HEAD WITHOUT CONTRAST  TECHNIQUE: Multiplanar, multiecho pulse sequences of the brain and surrounding structures were obtained without intravenous contrast. Angiographic images of the head were obtained using MRA technique without contrast.  COMPARISON:  Head CT 03/30/2015 and MRI 06/21/2005  FINDINGS: MRI HEAD FINDINGS  There are several punctate acute infarcts in the high, medial left frontal lobe which are predominantly subcortical in location. There is no evidence of intracranial hemorrhage, mass, midline shift, or extra-axial fluid collection. There is mild generalized cerebral atrophy. Patchy and confluent T2 hyperintensities in the periventricular greater than subcortical cerebral white matter have increased from the prior MRI and are nonspecific but compatible with moderate chronic small vessel  ischemic disease. Chronic lacunar infarcts are noted in the left corona radiata and right greater than left thalami. A small, chronic left cerebellar infarct is also noted.  Prior bilateral cataract extraction is noted. Paranasal sinuses mastoid air cells are clear. Major intracranial vascular flow voids are preserved.  MRA HEAD FINDINGS  Images are mildly to moderately motion degraded. Visualized distal vertebral arteries are patent and codominant without stenosis. Right PICA origin is patent. Left PICA is not identified. AICA and SCA origins are patent. Basilar artery is patent without stenosis. There is a fetal origin of the left PCA. There is moderate right P1 segment stenosis.  Internal carotid arteries are patent from skullbase to carotid termini without evidence of significant stenosis. M1 and A1 segments are patent without stenosis. A2 segments and MCA branch vessels are suboptimally evaluated due to motion artifact but appear patent without significant proximal stenosis identified. No intracranial aneurysm is identified.  IMPRESSION: 1. Punctate acute left frontal lobe infarcts. 2. Moderate chronic small vessel ischemic disease and chronic lacunar infarcts as above. 3. No major intracranial arterial occlusion or significant proximal anterior circulation stenosis. Moderate proximal right PCA stenosis.   Electronically Signed   By: Logan Bores   On: 03/30/2015 16:52    Non-Invasive Vascular Imaging: 02/21/2015 Carotid duplex  Syncopal episode about a month previously was the reason for ordering Carotid duplex.  IMPRESSION: Scattered atherosclerotic plaque throughout the carotid arteries without significant stenosis. Estimated degree of stenosis in the internal carotid arteries is less than 50% bilaterally. Concern for plaque ulcerations, particularly at the left carotid bulb.  Patent vertebral arteries.  ASSESSMENT/PLAN:  1.  CVA with residual neurologic deficits: left punctate frontal lobe  infarcts  2.  Atherosclerotic aortic arch  - TEE pending to r/o embolic event - Maximal medical mgmt: pravachol, aspirin - PT/OT/ST  Theda Sers, EMMA [facility] 03/31/2015 1:42 PM   Addendum  I have  independently interviewed and examined the patient, and I agree with the physician assistant's findings.  Pt continues to have neurologic losses from her CVA.  Her CTA Neck does NOT extend enough into the chest to make any comment on the aortic arch's overall atherosclerosis.  I also don't see any obvious evidence of thrombus.  Regardless, plaque or thrombus there is no indication for aortic arch thrombectomy.  If there is concern for arch thrombus, I would obtain a full CTA chest.  If there is thrombus, I would try to manage with anticoagulation.  I doubt CT surgery would offer her any surgical intervention in this setting.  In regards to the L carotid bulb, there is evidence of some atherosclerosis in that bulb, whether or not this is ulcerated is another question.  At <50% stenosis, I doubt anyone would offer L CEA as the risk-benefit analysis would NOT favor such.  At this point, I would continue the work-up for the etiology.  Maximal medical mgmt would be best for now.  Adele Barthel, MD [facility] Office: [phone] Pager: [phone]  03/31/2015, 3:23 PM

## 2015-03-31 NOTE — Evaluation (Signed)
Speech Language Pathology Evaluation Patient Details Name: Laura Melendez MRN: [id number] DOB: Mar 30, 1941 Today's Date: 03/31/2015 Time: 1207-1222 SLP Time Calculation (min) (ACUTE ONLY): 15 min  Problem List:  Patient Active Problem List   Diagnosis Date Noted  . CVA (cerebral infarction) 03/30/2015  . 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 Fever   . HCAP (healthcare-associated pneumonia) 01/29/2015  . Sepsis 01/29/2015  . DM (diabetes mellitus) 03/23/2014  . Osteoporosis, unspecified 03/23/2014  . Dyslipidemia 03/23/2014  . Tobacco abuse 03/23/2014  . Syncope 01/01/2014  . Essential hypertension, benign 01/01/2014   Past Medical History:  Past Medical History  Diagnosis Date  . Essential hypertension, benign   . Type 2 diabetes mellitus   . Osteoporosis 06/2008   Past Surgical History:  Past Surgical History  Procedure Laterality Date  . Cholecystectomy    . Ankle surgery Left 2004    otif  . Abdominal hysterectomy    . Anterior and posterior repair N/A 03/11/2013    Procedure: ANTERIOR (CYSTOCELE) AND POSTERIOR REPAIR (RECTOCELE);  Surgeon: Laura Hedges, DO;  Location: [facility];  Service: Gynecology;  Laterality: N/A;  with sacrospinous ligament fixation bilateral  . Colonoscopy  2009    negative  . Cataract extraction w/phaco Right 12/27/2014    Procedure: CATARACT EXTRACTION PHACO AND INTRAOCULAR LENS PLACEMENT RIGHT EYE;  Surgeon: Laura Branch, MD;  Location: [facility];  Service: Ophthalmology;  Laterality: Right;  CDE:10.63  . Cataract extraction w/phaco Left 01/13/2015    Procedure: CATARACT EXTRACTION PHACO AND INTRAOCULAR LENS PLACEMENT LEFT EYE;  Surgeon: Laura Branch, MD;  Location: [facility];  Service: Ophthalmology;  Laterality: Left;  CDE:18.87   HPI:  74 y.o. female with HTN, DM admitted with altered mental status and weakness in her right leg more than usual. MRI punctate acute left frontal lobe infarcts, moderate chronic small vessel ischemic  disease and chronic lacunar infarcts (left cerebellar and right thalamic)   Assessment / Plan / Recommendation Clinical Impression  Speech-language-cognitve assessment brief due to pt initiating lunch meal (daugther present). Pt exhibited intelligible speech, however decreasd vocal intesity present. Basic expression and comprehension appeared functional. Overall awareness of deficits decreased; suspect difficulty in complex problem solving and executive functioning. Recommend continued ST with further diagnostic treatment of cognitive-linguistic abilities.     SLP Assessment  Patient needs continued Speech Lanaguage Pathology Services    Follow Up Recommendations  Inpatient Rehab    Frequency and Duration min 2x/week  2 weeks   Pertinent Vitals/Pain Pain Assessment: No/denies pain   SLP Goals  Potential to Achieve Goals (ACUTE ONLY): Good  SLP Evaluation Prior Functioning  Cognitive/Linguistic Baseline: Within functional limits Type of Home: House  Lives With: Spouse Available Help at Discharge: Family;Available 24 hours/day   Cognition  Overall Cognitive Status: Impaired/Different from baseline Arousal/Alertness: Awake/alert Orientation Level: Oriented to person;Oriented to place;Oriented to time Attention: Sustained Sustained Attention: Appears intact Awareness: Impaired Awareness Impairment: Intellectual impairment;Emergent impairment;Anticipatory impairment Problem Solving:  (intact for basic) Safety/Judgment:  (to be further determined)    Comprehension  Auditory Comprehension Overall Auditory Comprehension: Appears within functional limits for tasks assessed (may have difficulty with higher level, ? if cognitive based) Yes/No Questions: Within Functional Limits Commands: Impaired Two Step Basic Commands: 75-100% accurate Interfering Components:  (decreased motivation/depressed?) Visual Recognition/Discrimination Discrimination: Not tested Reading  Comprehension Reading Status:  (TBA)    Expression Expression Primary Mode of Expression: Verbal Verbal Expression Overall Verbal Expression: Appears within functional limits for tasks assessed Initiation: No impairment  Level of Generative/Spontaneous Verbalization: Sentence Repetition: No impairment Naming:  (not present at sentence level) Pragmatics: No impairment Written Expression Dominant Hand: Right Written Expression:  (TBA)   Oral / Motor Oral Motor/Sensory Function Overall Oral Motor/Sensory Function:  (TBA) Motor Speech Overall Motor Speech: Impaired Respiration: Impaired Level of Impairment: Conversation Phonation: Low vocal intensity Resonance: Within functional limits Articulation: Within functional limitis Intelligibility: Intelligible Motor Planning: Witnin functional limits   GO     Laura Melendez 03/31/2015, 2:04 PM  Laura Melendez M.Ed [employer] [phone]

## 2015-03-31 NOTE — PMR Pre-admission (Signed)
PMR Admission Coordinator Pre-Admission Assessment  Patient: Laura Melendez is an 74 y.o., female MRN: [id number] DOB: [birth year] Height: 5\' 1"  (154.9 cm) Weight: 45.36 kg (100 lb)              Insurance Information HMO:     PPO: yes     PCP:      IPA:      80/20:      OTHER:  PRIMARY: [employer] Medicare HMO      Policy#: [id number]      Subscriber: self CM Name: Su Hoff, RN      Phone#: [phone], ext. [phone]     Fax#: [phone] Approval given on 04-01-15 and pt was admitted to [facility] on 04-02-15 due to medical issues. Authorization given for seven days from 04-02-15 through 04-08-15 with updates due to [place] on 04-07-15 to above fax.  Pre-Cert#: [id number]      Employer: retired Benefits:  Phone #: [phone]      Name: automated Eff. Date: 12-17-14     Deduct: $1288      Out of Pocket Max: none      Life Max: unlimited CIR: 100%, pre-auth needed      SNF: 100% days 1-20; 80% days 21-100 (100 days visit limit) Outpatient: 80%     Co-Pay: 20% Home Health: 100%      Co-Pay: none DME: 80%     Co-Pay: 20% Providers: in network  Emergency Contact Information Contact Information    Name Relation Home Work Mobile   [place] Spouse [phone]  [phone]   Peters,April Daughter   [phone]     Current Medical History  Patient Admitting Diagnosis: left frontal infarct  History of Present Illness: Laura Melendez is a 74 y.o. RH-female with history of DM type 2, HTN, DDD lumbar spine, recent febrile illness; who was admitted on 03/30/15 with MS changes, incontinence and weakness RLE. 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 MRI brain done revealing subcortical punctate acute infarcts in the high, medial left frontal lobe and MRA without major intracranial occlusion. Dr. Aram Beecham consulted and stroke workup ongoing. Patient with resultant right hemiparesis with impairments in mobility. CIR recommended by MD in anticipation for extensive rehab  needs.  NIH Total: 9  Past Medical History  Past Medical History  Diagnosis Date  . Essential hypertension, benign   . Type 2 diabetes mellitus   . Osteoporosis 06/2008  . Stroke     Family History  family history includes Cancer in her father and sister; Diabetes in her brother.  Prior Rehab/Hospitalizations: pt had L ankle fx and received outpt therapy.   Current Medications   Current facility-administered medications:  .   stroke: mapping our early stages of recovery book, , Does not apply, Once, Samella Parr, NP .  0.9 %  sodium chloride infusion, , Intravenous, Continuous, Mihai Croitoru, MD, Last Rate: 20 mL/hr at 04/01/15 1246, 500 mL at 04/01/15 1246 .  aspirin EC tablet 325 mg, 325 mg, Oral, Daily, Nita Sells, MD, 325 mg at 04/01/15 0923 .  calcium-vitamin D (OSCAL WITH D) 500-200 MG-UNIT per tablet 1 tablet, 1 tablet, Oral, BID WC, Samella Parr, NP, 1 tablet at 04/01/15 [phone] .  coumadin book, , Does not apply, Once, ADALEA HANDLER, RPH .  heparin injection 5,000 Units, 5,000 Units, Subcutaneous, 3 times per day, Samella Parr, NP, 5,000 Units at 04/01/15 1434 .  insulin aspart (novoLOG) injection 0-9 Units, 0-9 Units, Subcutaneous, TID  WC, Rhetta Mura Schorr, NP, 1 Units at 04/01/15 [phone] .  lisinopril (PRINIVIL,ZESTRIL) tablet 2.5 mg, 2.5 mg, Oral, Daily, Samella Parr, NP, 2.5 mg at 04/01/15 0924 .  metoprolol (LOPRESSOR) tablet 50 mg, 50 mg, Oral, BID, Samella Parr, NP, 50 mg at 04/01/15 4818 .  multivitamin with minerals tablet 1 tablet, 1 tablet, Oral, Daily, Samella Parr, NP, 1 tablet at 04/01/15 [phone] .  NIFEdipine (PROCARDIA-XL/ADALAT CC) 24 hr tablet 60 mg, 60 mg, Oral, Daily, Samella Parr, NP, 60 mg at 04/01/15 0923 .  omega-3 acid ethyl esters (LOVAZA) capsule 1 g, 1 g, Oral, BID, Samella Parr, NP, 1 g at 04/01/15 4970 .  pravastatin (PRAVACHOL) tablet 40 mg, 40 mg, Oral, q1800, Thurnell Lose, MD, 40 mg at 03/31/15 1752 .   raloxifene (EVISTA) tablet 60 mg, 60 mg, Oral, Daily, Samella Parr, NP, 60 mg at 04/01/15 1204 .  senna-docusate (Senokot-S) tablet 1 tablet, 1 tablet, Oral, QHS PRN, Samella Parr, NP .  warfarin (COUMADIN) tablet 5 mg, 5 mg, Oral, ONCE-1800, Arleen L Rumbarger, RPH .  warfarin (COUMADIN) video, , Does not apply, Once, SHERAN NEWSTROM, RPH .  Warfarin - Pharmacist Dosing Inpatient, , Does not apply, q1800, Para March, RPH  Patients Current Diet: Diet NPO time specified for TEE  Precautions / Restrictions Precautions Precautions: Fall Restrictions Weight Bearing Restrictions: No   Prior Activity Level Limited Community (1-2x/wk): Pt got out on limited errands and mainly only drove to the grocery store. Her husband does most of the driving and she accompanies him on some errands. She enjoys her soap operas ([employer] and the Restless) and enjoys cleaning the house and being with her grandkids.   Home Assistive Devices / Equipment Home Assistive Devices/Equipment: None Home Equipment: Darin - 2 wheels, Grab bars - toilet, Grab bars - tub/shower  Prior Functional Level Prior Function Level of Independence: Independent Comments: Still drives, Independent  Current Functional Level Cognition  Arousal/Alertness: Awake/alert Overall Cognitive Status: Impaired/Different from baseline Current Attention Level: Sustained Orientation Level: Oriented to person Following Commands: Follows one step commands with increased time, Follows one step commands inconsistently Safety/Judgement: Decreased awareness of safety, Decreased awareness of deficits General Comments: Pt requires increased time to initiate movement and does not follow all commands Attention: Sustained Sustained Attention: Appears intact Awareness: Impaired Awareness Impairment: Intellectual impairment, Emergent impairment, Anticipatory impairment Problem Solving:  (intact for basic) Safety/Judgment:  (to be further  determined)    Extremity Assessment (includes Sensation/Coordination)  Upper Extremity Assessment: Defer to OT evaluation RUE Deficits / Details: Pt with minimal ability to activate movement all muscle groups, but unable to sustain.   Able to perform ~80* elbow flexion with increased time.  Appears to have motor planning deficits  RUE Coordination: decreased fine motor, decreased gross motor LUE Deficits / Details: Pt reports pain at times Lt shoulder.  AROM WFL, but delayed responses when asking her to move   Lower Extremity Assessment: RLE deficits/detail RLE Deficits / Details: Limited assessment due to apraxia and decreased ability to follow commands. Rt knee extension 3/5 with MMT, but held weight against gravity functionally. RLE:  (poor cognition/apraxia)    ADLs  Overall ADL's : Needs assistance/impaired Eating/Feeding: Sitting, Bed level Grooming: Wash/dry hands, Wash/dry face, Oral care, Brushing hair, Minimal assistance, Sitting Upper Body Bathing: Moderate assistance, Sitting Lower Body Bathing: Maximal assistance, Sit to/from stand Upper Body Dressing : Maximal assistance, Sitting Lower Body Dressing: Total assistance, Sit to/from stand  Toilet Transfer: Moderate assistance, Stand-pivot, BSC Toileting- Clothing Manipulation and Hygiene: Total assistance, Sit to/from stand Functional mobility during ADLs: Moderate assistance General ADL Comments: Pt incontinent of urine.  Assisted her with peri care    Mobility  Overal bed mobility: Needs Assistance, +2 for physical assistance Bed Mobility: Sidelying to Sit, Rolling Rolling: Mod assist, +2 for physical assistance Sidelying to sit: Max assist, +2 for physical assistance, HOB elevated Supine to sit: Max assist, HOB elevated Sit to supine: Max assist General bed mobility comments: Max assist for sequencing and tactile cues for hand placement.  Pt requires assist using bed pad to roll to R side and max assist w/ BLEs and trunk  to sit upright EOB.    Transfers  Overall transfer level: Needs assistance Equipment used: None Transfers: Sit to/from Stand, Stand Pivot Transfers Sit to Stand: Max assist, +2 safety/equipment Stand pivot transfers: Max assist, +2 safety/equipment Squat pivot transfers: Mod assist General transfer comment: Max assist w/ R knee block for sit>stand and stand pivot.  Pt leaning posteriorly sitting EOB and standing.  No evidence of RLE muscle recruitment thorughout session but shuffles LLE during stand pivot.  Max cues for hand placement for safety.    Ambulation / Gait / Stairs / Wheelchair Mobility  Ambulation/Gait General Gait Details: Pivotal shuffle w/ max assist only    Posture / Balance Dynamic Sitting Balance Sitting balance - Comments: Sat EOB for several minutes and performed L LAQ w/ max assist w/ trunk posteriorly.  Pt unable to maintain balance despite verbal and tactile cues for hand placement and cues to lean anteriorly. Balance Overall balance assessment: Needs assistance Sitting-balance support: Bilateral upper extremity supported, Feet supported Sitting balance-Leahy Scale: Zero Sitting balance - Comments: Sat EOB for several minutes and performed L LAQ w/ max assist w/ trunk posteriorly.  Pt unable to maintain balance despite verbal and tactile cues for hand placement and cues to lean anteriorly. Postural control: Posterior lean, Right lateral lean Standing balance support: Bilateral upper extremity supported, During functional activity Standing balance-Leahy Scale: Zero Standing balance comment: Posterior lean and requires max assist to maintain upright    Special needs/care consideration BiPAP/CPAP no  CPM no  Continuous Drip IV no  Dialysis no         Life Vest no  Oxygen no  Special Bed no Trach Size no Wound Vac (area) no       Skin - no current skin issues                               Bowel mgmt: last BM on 03-29-15 Bladder mgmt: urinary  incontinence Diabetic mgmt - yes, managed at home with medications Pt has been emotionally labile.   Previous Home Environment Living Arrangements: Spouse/significant other  Lives With: Spouse Available Help at Discharge: Family Type of Home: House Home Layout: One level Home Access: Stairs to enter Entrance Stairs-Rails: Left, Right Entrance Stairs-Number of Steps: 5 Bathroom Shower/Tub: Multimedia programmer: Handicapped height [place]: No  Discharge Living Setting Plans for Discharge Living Setting: Patient's home Type of Home at Discharge: House Discharge Home Layout: One level Discharge Home Access: Stairs to enter Entrance Stairs-Rails: Right, Left Entrance Stairs-Number of Steps: 2 Discharge Bathroom Shower/Tub: Walk-in shower Discharge Bathroom Toilet: Handicapped height Does the patient have any problems obtaining your medications?: No  Social/Family/Support Systems Patient Roles: Spouse Contact Information: husband and dtr April are primary contacts  Anticipated Caregiver: husband and supportive family Anticipated Caregiver's Contact Information: see above Ability/Limitations of Caregiver: no limitations Caregiver Availability: 24/7 Discharge Plan Discussed with Primary Caregiver: Yes (discussed with husband, dtr and other family) Is Caregiver In Agreement with Plan?: Yes Does Caregiver/Family have Issues with Lodging/Transportation while Pt is in Rehab?: No (husband may spend the night w/pt)  Goals/Additional Needs Patient/Family Goal for Rehab: Supervision to [place] assist with PT/OT; Mod ind to supervision to [place] assist with SLP Expected length of stay: 10-20 days Cultural Considerations: [employer] Dietary Needs: currently NPO for TEE Equipment Needs: to be determined Pt/Family Agrees to Admission and willing to participate: Yes Program Orientation Provided & Reviewed with Pt/Caregiver Including Roles  & Responsibilities: Yes   Decrease  burden of Care through IP rehab admission: NA   Possible need for SNF placement upon discharge: not anticipated   Patient Condition: This patient's medical and functional status has changed since the consult dated: 03-31-15 in which the Rehabilitation Physician determined and documented that the patient's condition is appropriate for intensive rehabilitative care in an inpatient rehabilitation facility. See "History of Present Illness" (above) for medical update. Functional changes are: max assist x 2 for limited stand pivot transfers and moderate to maximal assistance with self care skills. Patient's medical and functional status update has been discussed with the Rehabilitation physician and patient remains appropriate for inpatient rehabilitation. Will admit to inpatient rehab tomorrow, Saturday 04-02-15.   Preadmission Screen Completed By:  Nanetta Batty, PT, 04/01/2015 3:19 PM ______________________________________________________________________   Discussed status with Dr. Naaman Plummer on 04-01-15 at 1520 and received telephone approval for admission tomorrow.  Admission Coordinator:  Nanetta Batty PT, time 1520/Date 04-01-15

## 2015-03-31 NOTE — Progress Notes (Signed)
CARE MANAGEMENT NOTE 03/31/2015  Patient:  Laura Melendez, Laura Melendez   Account Number:  [id number]  Date Initiated:  03/31/2015  Documentation initiated by:  Lorne Skeens  Subjective/Objective Assessment:   Patient was admitted with CVA.     Action/Plan:   Will follow for discharge needs pending PT/OT evals and physician orders.   Anticipated DC Date:  04/01/2015   Anticipated DC Plan:  IP REHAB FACILITY  In-house referral  Clinical Social Worker         Choice offered to / List presented to:             Status of service:  In process, will continue to follow Medicare Important Message given?   (If response is "NO", the following Medicare IM given date fields will be blank) Date Medicare IM given:   Medicare IM given by:   Date Additional Medicare IM given:   Additional Medicare IM given by:    Discharge Disposition:    Per UR Regulation:  Reviewed for med. necessity/level of care/duration of stay  If discussed at [place] of Stay Meetings, dates discussed:    Comments:

## 2015-03-31 NOTE — Progress Notes (Addendum)
Patient Demographics  Laura Melendez, is a 74 y.o. female, DOB - 1941-02-17, KXF:[id number]  Admit date - 03/30/2015   Admitting Physician Costin Karlyne Greenspan, MD  Outpatient Primary MD for the patient is LUKING,SCOTT, MD  LOS - 1   Chief Complaint  Patient presents with  . Extremity Weakness        Subjective:   Laura Melendez today has, No headache, No chest pain, No abdominal pain - No Nausea, No new weakness tingling or numbness, No Cough - SOB.  Continues to have extreme right-sided weakness. Speech has improved a little bit.  Assessment & Plan    1. Left frontal lobe multiple infarcts with dense right-sided weakness and some aphasia/dysarthria. Currently on full dose aspirin along with increased statin dose. Full stroke workup underway. Neurology following. Will be seen by PT-OT and speech. A1c is pending. Echo pending. TEE requested as well by Neuro likely in am.   2. CT angiogram of the neck - showing possible plaque or thrombus in the aortic arch worse is some left CCA stenosis. Discussed with neurology continue aspirin for now, have requested vascular surgery to give formal opinion. Continue on full dose aspirin along with statin dose which has been increased for better LDL control. Counseled to quit smoking.   3. Dyslipidemia. Increased statin for better LDL control.   4. Hypertension on beta blocker and low-dose ACE inhibitor continue.   5. Smoking. Counseled to quit. She apparently stopped around 6-7 months ago.   6.DM2 - latest A1c pending, on sliding scale for now  CBG (last 3)   Recent Labs  03/30/15 1646 03/30/15 2133 03/31/15 0736  GLUCAP 112* 124* 109*     Lab Results  Component Value Date   HGBA1C 6.7 02/15/2015    Code Status: Full  Family Communication:  husband  Disposition Plan: SNF/CIR   Procedures   MRI/MRA brain, CT brain. Echogram. CTA Neck , TEE   Consults  Neuro, vascular surgery   Medications  Scheduled Meds: .  stroke: mapping our early stages of recovery book   Does not apply Once  . aspirin EC  325 mg Oral Daily  . calcium-vitamin D  1 tablet Oral BID WC  . heparin  5,000 Units Subcutaneous 3 times per day  . insulin aspart  0-9 Units Subcutaneous TID WC  . lisinopril  2.5 mg Oral Daily  . metoprolol  50 mg Oral BID  . multivitamin with minerals  1 tablet Oral Daily  . NIFEdipine  60 mg Oral Daily  . omega-3 acid ethyl esters  1 g Oral BID  . pravastatin  40 mg Oral q1800  . raloxifene  60 mg Oral Daily   Continuous Infusions:  PRN Meds:.senna-docusate  DVT Prophylaxis    Heparin   Lab Results  Component Value Date   PLT 204 03/30/2015    Antibiotics    Anti-infectives    None          Objective:   Filed Vitals:   03/31/15 0225 03/31/15 0400 03/31/15 0600 03/31/15 1000  BP: 156/51 145/71 147/54 127/62  Pulse: 61 62 61 80  Temp: 97.9 F (36.6 C) 97.9 F (36.6 C) 98.2 F (36.8 C) 99 F (37.2 C)  TempSrc: Oral  Oral Oral Oral  Resp: 18 18 18 18   Height:      Weight:      SpO2: 95% 98% 95% 96%    Wt Readings from Last 3 Encounters:  03/30/15 45.36 kg (100 lb)  02/15/15 45.813 kg (101 lb)  01/31/15 44.7 kg (98 lb 8.7 oz)     Intake/Output Summary (Last 24 hours) at 03/31/15 1048 Last data filed at 03/31/15 0758  Gross per 24 hour  Intake    360 ml  Output      0 ml  Net    360 ml     Physical Exam  Awake Alert, Oriented X 3, No new F.N deficits, Normal affect, Dense right-sided hemiparesis with mild expressive aphasia [place].AT,PERRAL Supple Neck,No JVD, No cervical lymphadenopathy appriciated.  Symmetrical Chest wall movement, Good air movement bilaterally, CTAB RRR,No Gallops,Rubs or new Murmurs, No Parasternal Heave +ve B.Sounds, Abd Soft, No tenderness, No organomegaly  appriciated, No rebound - guarding or rigidity. No Cyanosis, Clubbing or edema, No new Rash or bruise      Data Review   Micro Results No results found for this or any previous visit (from the past 240 hour(s)).  Radiology Reports Ct Head Wo Contrast  03/30/2015   ADDENDUM REPORT: 03/30/2015 15:35  ADDENDUM: Critical Value/emergent results were called by telephone at the time of interpretation on 03/30/2015 at 3:35 pm to Dr. Aram Beecham, neurology , who verbally acknowledged these results.   Electronically Signed   By: Lowella Grip III M.D.   On: 03/30/2015 15:35   03/30/2015   CLINICAL DATA:  Acute onset right lower extremity weakness  EXAM: CT HEAD WITHOUT CONTRAST  TECHNIQUE: Contiguous axial images were obtained from the base of the skull through the vertex without intravenous contrast.  COMPARISON:  January 29, 2015  FINDINGS: There is mild diffuse atrophy. There is no intracranial mass hemorrhage, extra-axial fluid collection, or midline shift. There is evidence of a prior small infarct in the mid left cerebellum, stable. There is small vessel disease throughout the periventricular white matter bilaterally. There is a new area of decreased attenuation in the posterior right corona radiata which may represent an acute/recent small white matter infarct in this area. No other new gray-white compartment lesion identified. There is small vessel disease in the anterior limb of the left internal capsule, stable. There is also a prior small infarct in the right thalamus, stable. Bony calvarium appears intact. The mastoid air cells are clear. The middle cerebral arteries have symmetric attenuation and appear stable bilaterally.  IMPRESSION: Suspect new small white matter infarct in the right corona radiata. Extensive supratentorial/periventricular small vessel disease remains. Prior small infarct in the anterior limb of the left internal capsule as well as in the right thalamus and left cerebellum remains  stable. No hemorrhage or mass effect.  Electronically Signed: By: Lowella Grip III M.D. On: 03/30/2015 15:25   Ct Angio Neck W/cm &/or Wo/cm  03/31/2015   CLINICAL DATA:  74 year old female with small acute left frontal lobe infarcts diagnosed on MRI during evaluation of right lower extremity weakness. Initial encounter.  EXAM: CT ANGIOGRAPHY NECK  TECHNIQUE: Multidetector CT imaging of the neck was performed using the standard protocol during bolus administration of intravenous contrast. Multiplanar CT image reconstructions and MIPs were obtained to evaluate the vascular anatomy. Carotid stenosis measurements (when applicable) are obtained utilizing NASCET criteria, using the distal internal carotid diameter as the denominator.  CONTRAST:  10mL OMNIPAQUE IOHEXOL 350 MG/ML SOLN  COMPARISON:  Brain MRI and intracranial MRA 03/30/2015, and earlier  FINDINGS: Aortic arch: The aortic arch is not entirely included on these images. There is a bovine arch configuration. There is moderate to severe arch atherosclerosis including mural plaque or thrombus up to 8 mm in the distal arch.  Right carotid system: No brachiocephalic or right CCA origin stenosis. Mildly tortuous right CCA with a partially retropharyngeal course. Soft and calcified plaque at the right carotid bifurcation but no right ICA origin stenosis. Tortuous right ICA with a partially retropharyngeal course, otherwise negative to the skullbase. Visible right ICA siphon is patent with calcified plaque.  Left carotid system: Calcified plaque at the left CCA origin resulting in up to 50 % stenosis with respect to the distal vessel (coronal image 144). Beyond its origin the left CCA is mildly tortuous. There is calcified plaque at the left carotid bifurcation and involving the posterior left ICA bulb but not resulting in left ICA origin stenosis. Tortuous left ICA just beyond the bulb with a mildly kinked appearance. Otherwise negative to the skullbase.  Visible left ICA siphon is patent with calcified plaque.  Vertebral arteries:  Calcified plaque at the right subclavian artery origin resulting in stenosis of less than 50 % with respect to the distal vessel . Calcified plaque at the right vertebral artery origin with mild stenosis. Otherwise negative right vertebral artery to the vertebrobasilar junction. Normal right PICA origin.  Calcified plaque in the left subclavian artery origin with stenosis up to 60 % with respect to the distal vessel. Mild calcified plaque at the left vertebral artery origin with mild stenosis. The proximal left vertebral artery is tortuous. The vertebral arteries are codominant. The left is otherwise negative to the vertebrobasilar junction. Dominant appearing left AICA.  Skeleton: Largely absent dentition. Degenerative changes in the cervical spine. Thoracic scoliosis. No acute osseous abnormality identified. Mild bubbly opacity in the right posterior ethmoid air cells. Other Visualized paranasal sinuses and mastoids are clear.  Other neck: Thyromegaly with bilateral less than 15 mm hypodense thyroid nodules. Larynx, pharynx, parapharyngeal spaces and sublingual space are within normal limits. Negative retropharyngeal space aside from a retropharyngeal course of the carotids, more so the right. Submandibular and parotid glands are within normal limits. Negative orbits soft tissues.  No cervical lymphadenopathy.  Dependent atelectasis in the left upper lobe. No superior mediastinal lymphadenopathy.  IMPRESSION: 1. 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 No other Hemodynamically significant stenosis of the carotid or vertebral arteries in the neck. 2. 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   Electronically Signed   By: Genevie Ann M.D.   On: 03/31/2015 07:21   Mr Jodene Nam Head Wo Contrast  03/30/2015   CLINICAL DATA:  Right leg weakness.   EXAM: MRI HEAD WITHOUT CONTRAST  MRA HEAD WITHOUT CONTRAST  TECHNIQUE: Multiplanar, multiecho pulse sequences of the brain and surrounding structures were obtained without intravenous contrast. Angiographic images of the head were obtained using MRA technique without contrast.  COMPARISON:  Head CT 03/30/2015 and MRI 06/21/2005  FINDINGS: MRI HEAD FINDINGS  There are several punctate acute infarcts in the high, medial left frontal lobe which are predominantly subcortical in location. There is no evidence of intracranial hemorrhage, mass, midline shift, or extra-axial fluid collection. There is mild generalized cerebral atrophy. Patchy and confluent T2 hyperintensities in the periventricular greater than subcortical cerebral white matter have increased  from the prior MRI and are nonspecific but compatible with moderate chronic small vessel ischemic disease. Chronic lacunar infarcts are noted in the left corona radiata and right greater than left thalami. A small, chronic left cerebellar infarct is also noted.  Prior bilateral cataract extraction is noted. Paranasal sinuses mastoid air cells are clear. Major intracranial vascular flow voids are preserved.  MRA HEAD FINDINGS  Images are mildly to moderately motion degraded. Visualized distal vertebral arteries are patent and codominant without stenosis. Right PICA origin is patent. Left PICA is not identified. AICA and SCA origins are patent. Basilar artery is patent without stenosis. There is a fetal origin of the left PCA. There is moderate right P1 segment stenosis.  Internal carotid arteries are patent from skullbase to carotid termini without evidence of significant stenosis. M1 and A1 segments are patent without stenosis. A2 segments and MCA branch vessels are suboptimally evaluated due to motion artifact but appear patent without significant proximal stenosis identified. No intracranial aneurysm is identified.  IMPRESSION: 1. Punctate acute left frontal lobe  infarcts. 2. Moderate chronic small vessel ischemic disease and chronic lacunar infarcts as above. 3. No major intracranial arterial occlusion or significant proximal anterior circulation stenosis. Moderate proximal right PCA stenosis.   Electronically Signed   By: Logan Bores   On: 03/30/2015 16:52   Mr Brain Wo Contrast  03/30/2015   CLINICAL DATA:  Right leg weakness.  EXAM: MRI HEAD WITHOUT CONTRAST  MRA HEAD WITHOUT CONTRAST  TECHNIQUE: Multiplanar, multiecho pulse sequences of the brain and surrounding structures were obtained without intravenous contrast. Angiographic images of the head were obtained using MRA technique without contrast.  COMPARISON:  Head CT 03/30/2015 and MRI 06/21/2005  FINDINGS: MRI HEAD FINDINGS  There are several punctate acute infarcts in the high, medial left frontal lobe which are predominantly subcortical in location. There is no evidence of intracranial hemorrhage, mass, midline shift, or extra-axial fluid collection. There is mild generalized cerebral atrophy. Patchy and confluent T2 hyperintensities in the periventricular greater than subcortical cerebral white matter have increased from the prior MRI and are nonspecific but compatible with moderate chronic small vessel ischemic disease. Chronic lacunar infarcts are noted in the left corona radiata and right greater than left thalami. A small, chronic left cerebellar infarct is also noted.  Prior bilateral cataract extraction is noted. Paranasal sinuses mastoid air cells are clear. Major intracranial vascular flow voids are preserved.  MRA HEAD FINDINGS  Images are mildly to moderately motion degraded. Visualized distal vertebral arteries are patent and codominant without stenosis. Right PICA origin is patent. Left PICA is not identified. AICA and SCA origins are patent. Basilar artery is patent without stenosis. There is a fetal origin of the left PCA. There is moderate right P1 segment stenosis.  Internal carotid arteries  are patent from skullbase to carotid termini without evidence of significant stenosis. M1 and A1 segments are patent without stenosis. A2 segments and MCA branch vessels are suboptimally evaluated due to motion artifact but appear patent without significant proximal stenosis identified. No intracranial aneurysm is identified.  IMPRESSION: 1. Punctate acute left frontal lobe infarcts. 2. Moderate chronic small vessel ischemic disease and chronic lacunar infarcts as above. 3. No major intracranial arterial occlusion or significant proximal anterior circulation stenosis. Moderate proximal right PCA stenosis.   Electronically Signed   By: Logan Bores   On: 03/30/2015 16:52     CBC  Recent Labs Lab 03/30/15 1212 03/30/15 1217  WBC 6.2  --  HGB 13.4 14.6  HCT 40.9 43.0  PLT 204  --   MCV 81.5  --   MCH 26.7  --   MCHC 32.8  --   RDW 14.4  --   LYMPHSABS 2.1  --   MONOABS 0.4  --   EOSABS 0.1  --   BASOSABS 0.1  --     Chemistries   Recent Labs Lab 03/30/15 1212 03/30/15 1217  NA 142 142  K 3.9 3.9  CL 105 105  CO2 26  --   GLUCOSE 133* 132*  BUN 13 15  CREATININE 0.58 0.60  CALCIUM 9.7  --   AST 22  --   ALT 16  --   ALKPHOS 40  --   BILITOT 0.5  --    ------------------------------------------------------------------------------------------------------------------ estimated creatinine clearance is 44.2 mL/min (by C-G formula based on Cr of 0.6). ------------------------------------------------------------------------------------------------------------------ No results for input(s): HGBA1C in the last 72 hours. ------------------------------------------------------------------------------------------------------------------  Recent Labs  03/31/15 0625  CHOL 166  HDL 41  LDLCALC 91  TRIG 170*  CHOLHDL 4.0   ------------------------------------------------------------------------------------------------------------------ No results for input(s): TSH, T4TOTAL,  T3FREE, THYROIDAB in the last 72 hours.  Invalid input(s): FREET3 ------------------------------------------------------------------------------------------------------------------ No results for input(s): VITAMINB12, FOLATE, FERRITIN, TIBC, IRON, RETICCTPCT in the last 72 hours.  Coagulation profile  Recent Labs Lab 03/30/15 1212  INR 1.03    No results for input(s): DDIMER in the last 72 hours.  Cardiac Enzymes No results for input(s): CKMB, TROPONINI, MYOGLOBIN in the last 168 hours.  Invalid input(s): CK ------------------------------------------------------------------------------------------------------------------ Invalid input(s): POCBNP     Time Spent in minutes  35   Lala Lund K M.D on 03/31/2015 at 10:48 AM  Between 7am to 7pm - Pager - [phone]  After 7pm go to [email or web address] - password [email or web address]  [facility]   Office  [phone]

## 2015-04-01 ENCOUNTER — Encounter (HOSPITAL_COMMUNITY)
Admission: EM | Disposition: A | Discharge status: Other Acute Inpatient Hospital | Payer: Self-pay | Source: Home / Self Care | Attending: Internal Medicine

## 2015-04-01 ENCOUNTER — Encounter (HOSPITAL_COMMUNITY): Payer: Self-pay | Admitting: *Deleted

## 2015-04-01 DIAGNOSIS — I63412 Cerebral infarction due to embolism of left middle cerebral artery: Secondary | ICD-10-CM | POA: Insufficient documentation

## 2015-04-01 DIAGNOSIS — I63422 Cerebral infarction due to embolism of left anterior cerebral artery: Secondary | ICD-10-CM

## 2015-04-01 HISTORY — PX: TEE WITHOUT CARDIOVERSION: SHX5443

## 2015-04-01 LAB — HEMOGLOBIN A1C
HEMOGLOBIN A1C: 6.9 % — AB (ref 4.8–5.6)
Hgb A1c MFr Bld: 7 % — ABNORMAL HIGH (ref 4.8–5.6)
Mean Plasma Glucose: 154 mg/dL
Mean Plasma Glucose: 151 mg/dL

## 2015-04-01 LAB — GLUCOSE, CAPILLARY
GLUCOSE-CAPILLARY: 107 mg/dL — AB (ref 70–99)
Glucose-Capillary: 129 mg/dL — ABNORMAL HIGH (ref 70–99)
Glucose-Capillary: 124 mg/dL — ABNORMAL HIGH (ref 70–99)
Glucose-Capillary: 93 mg/dL (ref 70–99)

## 2015-04-01 SURGERY — ECHOCARDIOGRAM, TRANSESOPHAGEAL
Anesthesia: Moderate Sedation

## 2015-04-01 MED ORDER — SODIUM CHLORIDE 0.9 % IV SOLN
INTRAVENOUS | Status: DC
  Administered 2015-04-01: 500 mL via INTRAVENOUS

## 2015-04-01 MED ORDER — WARFARIN - PHARMACIST DOSING INPATIENT
Freq: Every day | Status: DC
  Administered 2015-04-01: 18:00:00

## 2015-04-01 MED ORDER — WARFARIN VIDEO
Freq: Once | Status: AC
  Administered 2015-04-01: 18:00:00

## 2015-04-01 MED ORDER — MIDAZOLAM HCL 10 MG/2ML IJ SOLN
INTRAMUSCULAR | Status: DC | PRN
  Administered 2015-04-01 (×2): 1 mg via INTRAVENOUS

## 2015-04-01 MED ORDER — BUTAMBEN-TETRACAINE-BENZOCAINE 2-2-14 % EX AERO
INHALATION_SPRAY | CUTANEOUS | Status: DC | PRN
  Administered 2015-04-01: 2 via TOPICAL

## 2015-04-01 MED ORDER — FENTANYL CITRATE (PF) 100 MCG/2ML IJ SOLN
INTRAMUSCULAR | Status: DC | PRN
  Administered 2015-04-01: 25 ug via INTRAVENOUS

## 2015-04-01 MED ORDER — FENTANYL CITRATE (PF) 100 MCG/2ML IJ SOLN
INTRAMUSCULAR | Status: AC
  Filled 2015-04-01: qty 2

## 2015-04-01 MED ORDER — MIDAZOLAM HCL 5 MG/ML IJ SOLN
INTRAMUSCULAR | Status: AC
  Filled 2015-04-01: qty 2

## 2015-04-01 MED ORDER — COUMADIN BOOK
Freq: Once | Status: AC
  Administered 2015-04-01: 16:00:00
  Filled 2015-04-01: qty 1

## 2015-04-01 MED ORDER — WARFARIN SODIUM 5 MG PO TABS
5.0000 mg | ORAL_TABLET | Freq: Once | ORAL | Status: AC
Start: 2015-04-01 — End: 2015-04-01
  Administered 2015-04-01: 5 mg via ORAL
  Filled 2015-04-01: qty 1

## 2015-04-01 NOTE — Interval H&P Note (Signed)
History and Physical Interval Note:  04/01/2015 12:51 PM  Laura Melendez  has presented today for surgery, with the diagnosis of stroke  The various methods of treatment have been discussed with the patient and family. After consideration of risks, benefits and other options for treatment, the patient has consented to  Procedure(s): TRANSESOPHAGEAL ECHOCARDIOGRAM (TEE) (N/A) as a surgical intervention .  The patient's history has been reviewed, patient examined, no change in status, stable for surgery.  I have reviewed the patient's chart and labs.  Questions were answered to the patient's satisfaction.     Wilhelm Ganaway

## 2015-04-01 NOTE — Progress Notes (Signed)
  Echocardiogram Echocardiogram Transesophageal has been performed.  Nayara Taplin FRANCES 04/01/2015, 1:51 PM

## 2015-04-01 NOTE — H&P (View-Only) (Signed)
Referring Physician: Jeneen Rinks    Chief Complaint: Code stroke  HPI:                                                                                                                                         Laura Melendez is an 74 y.o. female with HTN, DM, who was noted yesterday to be acting different than usual (more quite and not very active).  This AM she was noted to have urinated in her bed. Her husband had her stand up and changed the sheets.  They went back to sleep.  She awoke around 0900 and was noted to have weakness in her right leg more than usual. EMS was called and she was brought to ED. While in ED she continued to show only right leg weakness. 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   Denies HA, vertigo, double vision, difficulty swallowing, slurred speech, language or vision impairment.  Date last known well: Date: 03/30/2015 Time last known well: Time: 03:00 tPA Given: No: out of window Modified Rankin: Rankin Score=1    Past Medical History  Diagnosis Date  . Essential hypertension, benign   . Type 2 diabetes mellitus   . Osteoporosis 06/2008    Past Surgical History  Procedure Laterality Date  . Cholecystectomy    . Ankle surgery Left 2004    otif  . Abdominal hysterectomy    . Anterior and posterior repair N/A 03/11/2013    Procedure: ANTERIOR (CYSTOCELE) AND POSTERIOR REPAIR (RECTOCELE);  Surgeon: Linda Hedges, DO;  Location: [facility];  Service: Gynecology;  Laterality: N/A;  with sacrospinous ligament fixation bilateral  . Colonoscopy  2009    negative  . Cataract extraction w/phaco Right 12/27/2014    Procedure: CATARACT EXTRACTION PHACO AND INTRAOCULAR LENS PLACEMENT RIGHT EYE;  Surgeon: Tonny Branch, MD;  Location: [facility];  Service: Ophthalmology;  Laterality: Right;  CDE:10.63  . Cataract extraction w/phaco Left 01/13/2015    Procedure: CATARACT EXTRACTION PHACO AND INTRAOCULAR LENS  PLACEMENT LEFT EYE;  Surgeon: Tonny Branch, MD;  Location: [facility];  Service: Ophthalmology;  Laterality: Left;  CDE:18.87    Family History  Problem Relation Age of Onset  . Cancer Father     Liver  . Cancer Sister     Breast  . Diabetes Brother    Social History:  reports that she quit smoking about 4 weeks ago. Her smoking use included Cigarettes. She has a 7.5 pack-year smoking history. She does not have any smokeless tobacco history on file. She reports that she does not drink alcohol or use illicit drugs. Family history: no epilepsy, brain tumors, or brain aneurysms. Allergies: No Known Allergies  Medications:  No current facility-administered medications for this encounter.   Current Outpatient Prescriptions  Medication Sig Dispense Refill  . alendronate (FOSAMAX) 70 MG tablet Take 1 tablet (70 mg total) by mouth every 7 (seven) days. 12 tablet 3  . aspirin EC 81 MG tablet Take 81 mg by mouth daily.    . Calcium Carb-Cholecalciferol (CALCIUM 600/VITAMIN D3) 600-800 MG-UNIT TABS Take 1 tablet by mouth 2 (two) times daily with a meal.    . Cholecalciferol (VITAMIN D3) 400 UNITS CAPS Take 1 capsule by mouth 3 (three) times daily.    Marland Kitchen CINNAMON PO Take 1,000 mg by mouth daily.    Marland Kitchen CRANBERRY PO Take 84 mg by mouth daily.    Marland Kitchen lisinopril (PRINIVIL,ZESTRIL) 2.5 MG tablet Take 1 tablet (2.5 mg total) by mouth daily. 90 tablet 3  . metFORMIN (GLUCOPHAGE) 500 MG tablet Take 1 tablet (500 mg total) by mouth 2 (two) times daily with a meal. 180 tablet 3  . metoprolol (LOPRESSOR) 50 MG tablet Take 1 tablet (50 mg total) by mouth 2 (two) times daily. 180 tablet 3  . Multiple Vitamin (MULTIVITAMIN WITH MINERALS) TABS Take 1 tablet by mouth daily.    . naproxen sodium (ANAPROX) 220 MG tablet Take 220 mg by mouth daily as needed (pain).    Marland Kitchen NIFEdipine (PROCARDIA XL/ADALAT-CC)  60 MG 24 hr tablet Take 1 tablet (60 mg total) by mouth daily. 90 tablet 3  . Omega-3 Fatty Acids (FISH OIL CONCENTRATE) 300 MG CAPS Take 1 capsule by mouth daily.    . pravastatin (PRAVACHOL) 20 MG tablet Take 1 tablet (20 mg total) by mouth daily. 30 tablet 2  . raloxifene (EVISTA) 60 MG tablet Take 1 tablet (60 mg total) by mouth daily. 90 tablet 3     ROS:                                                                                                                                       History obtained from the patient  General ROS: negative for - chills, fatigue, fever, night sweats, weight gain or weight loss Psychological ROS: negative for - behavioral disorder, hallucinations, memory difficulties, mood swings or suicidal ideation Ophthalmic ROS: negative for - blurry vision, double vision, eye pain or loss of vision ENT ROS: negative for - epistaxis, nasal discharge, oral lesions, sore throat, tinnitus or vertigo Allergy and Immunology ROS: negative for - hives or itchy/watery eyes Hematological and Lymphatic ROS: negative for - bleeding problems, bruising or swollen lymph nodes Endocrine ROS: negative for - galactorrhea, hair pattern changes, polydipsia/polyuria or temperature intolerance Respiratory ROS: negative for - cough, hemoptysis, shortness of breath or wheezing Cardiovascular ROS: negative for - chest pain, dyspnea on exertion, edema or irregular heartbeat Gastrointestinal ROS: negative for - abdominal pain, diarrhea, hematemesis, nausea/vomiting or stool incontinence Genito-Urinary ROS: negative for - dysuria, hematuria, incontinence or urinary frequency/urgency Musculoskeletal ROS: negative for -  joint swelling or muscular weakness Neurological ROS: as noted in HPI Dermatological ROS: negative for rash and skin lesion changes  Physical Examination:                                                                                                      Blood pressure  143/58, pulse 76, temperature 98.7 F (37.1 C), temperature source Oral, resp. rate 14, height 5\' 1"  (1.549 m), weight 45.36 kg (100 lb), SpO2 98 %.  HEENT-  Normocephalic, no lesions, without obvious abnormality.  Normal external eye and conjunctiva.  Normal TM's bilaterally.  Normal auditory canals and external ears. Normal external nose, mucus membranes and septum.  Normal pharynx. Cardiovascular- S1, S2 normal, pulses palpable throughout   Lungs- chest clear, no wheezing, rales, normal symmetric air entry, Heart exam - S1, S2 normal, no murmur, no gallop, rate regular Abdomen- normal findings: bowel sounds normal Extremities- no edema Lymph-no adenopathy palpable Musculoskeletal-no joint tenderness, deformity or swelling Skin-warm and dry, no hyperpigmentation, vitiligo, or suspicious lesions  Neurological Examination Mental Status: Alert, oriented, thought content appropriate.  Speech fluent without evidence of aphasia.  Able to follow 3 step commands without difficulty. Cranial Nerves: II: Discs flat bilaterally; Visual fields grossly normal, pupils equal, round, reactive to light and accommodation III,IV, VI: ptosis not present, extra-ocular motions intact bilaterally V,VII: smile symmetric, facial light touch sensation normal bilaterally VIII: hearing normal bilaterally IX,X: uvula rises symmetrically XI: bilateral shoulder shrug XII: midline tongue extension Motor: Right : Upper extremity   5/5    Left:     Upper extremity   5/5  Lower extremity   3/5     Lower extremity   5/5 Tone and bulk:normal tone throughout; no atrophy noted Sensory: Pinprick and light touch intact throughout, bilaterally Deep Tendon Reflexes: 2+ and symmetric throughout Plantars: Right: downgoing   Left: downgoing Cerebellar: normal finger-to-nose,  and normal heel-to-shin test on the left --right unable to obtain due to weakness.  Gait: not tested.       Lab Results: Basic Metabolic  Panel:  Recent Labs Lab 03/30/15 1217  NA 142  K 3.9  CL 105  GLUCOSE 132*  BUN 15  CREATININE 0.60    Liver Function Tests: No results for input(s): AST, ALT, ALKPHOS, BILITOT, PROT, ALBUMIN in the last 168 hours. No results for input(s): LIPASE, AMYLASE in the last 168 hours. No results for input(s): AMMONIA in the last 168 hours.  CBC:  Recent Labs Lab 03/30/15 1212 03/30/15 1217  WBC 6.2  --   NEUTROABS 3.6  --   HGB 13.4 14.6  HCT 40.9 43.0  MCV 81.5  --   PLT 204  --     Cardiac Enzymes: No results for input(s): CKTOTAL, CKMB, CKMBINDEX, TROPONINI in the last 168 hours.  Lipid Panel: No results for input(s): CHOL, TRIG, HDL, CHOLHDL, VLDL, LDLCALC in the last 168 hours.  CBG: No results for input(s): GLUCAP in the last 168 hours.  Microbiology: Results for orders placed or performed during the hospital encounter of 01/28/15  Urine culture  Status: None   Collection Time: 01/28/15 10:51 PM  Result Value Ref Range Status   Specimen Description URINE, RANDOM  Final   Special Requests NONE  Final   Colony Count   Final    7,000 COLONIES/ML Performed at [facility]    Culture   Final    INSIGNIFICANT GROWTH Performed at [facility]    Report Status 01/30/2015 FINAL  Final  CSF culture     Status: None   Collection Time: 01/29/15  1:25 AM  Result Value Ref Range Status   Specimen Description BACK  Final   Special Requests Normal  Final   Gram Stain   Final    NO WBC SEEN NO ORGANISMS SEEN CYTOSPIN Performed at [facility] Performed at [facility]    Culture   Final    NO GROWTH 3 DAYS Performed at [facility]    Report Status 02/01/2015 FINAL  Final  Gram stain     Status: None   Collection Time: 01/29/15  1:25 AM  Result Value Ref Range Status   Specimen Description CSF  Final   Special Requests Normal  Final   Gram Stain CYTOSPUN NO ORGANISMS SEEN NO WBC SEEN   Final   Report Status  01/29/2015 FINAL  Final  Herpes simplex virus culture     Status: None   Collection Time: 01/29/15  1:25 AM  Result Value Ref Range Status   Specimen Description BACK  Final   Special Requests Normal  Final   Culture   Final    THIS TEST WAS ORDERED IN ERROR AND HAS BEEN CREDITED.   Report Status 01/31/2015 FINAL  Final  Culture, blood (routine x 2)     Status: None   Collection Time: 01/29/15  5:20 AM  Result Value Ref Range Status   Specimen Description BLOOD LEFT ARM  Final   Special Requests BOTTLES DRAWN AEROBIC AND ANAEROBIC 5CC EACH  Final   Culture   Final    NO GROWTH 5 DAYS Performed at [facility]    Report Status 02/04/2015 FINAL  Final  Culture, blood (routine x 2)     Status: None   Collection Time: 01/29/15  5:25 AM  Result Value Ref Range Status   Specimen Description BLOOD LEFT FOREARM  Final   Special Requests BOTTLES DRAWN AEROBIC AND ANAEROBIC 5CC EACH  Final   Culture   Final    NO GROWTH 5 DAYS Performed at [facility]    Report Status 02/04/2015 FINAL  Final    Coagulation Studies: No results for input(s): LABPROT, INR in the last 72 hours.  Imaging: No results found.     Assessment and plan discussed with with attending physician and they are in agreement.    Etta Quill PA-C [facility] [phone]  03/30/2015, 12:45 PM   Assessment: 74 y.o. female presenting with right leg weakness however LNW was 0300 hours and patient is out of window for tPA.  Cannot rule out a left brain ischemic CVA in the ACA territory, unlikely a cord syndrome.    Stroke Risk Factors - diabetes mellitus  Recommend: 1. HgbA1c, fasting lipid panel 2. MRI, MRA  of the brain without contrast 3. PT consult, OT consult, Speech consult 4. Echocardiogram 5. Carotid dopplers 6. Prophylactic therapy-Antiplatelet med: Aspirin - dose 325 daily 7. Risk factor modification 8. Telemetry monitoring 9. Frequent neuro checks 10 NPO until passes  stroke swallow screen  Patient  seen and examined together with physician assistant and I concur with the assessment and plan.  Dorian Pod, MD

## 2015-04-01 NOTE — Progress Notes (Signed)
Rehab admissions - I am following pt's case and have submitted PT/OT evaluations to [facility] for their review. I met with pt's family this am (pt was busy working with PT at that time) to share that we are waiting on response from insurance. Pt's husband and family gave further baseline details about pt's function and they are in full support of pt coming to CIR. Husband is aware of copays and in support of pt coming to CIR.  I spoke with Dr. Candiss Norse and he stated pt is planned to have TEE today and may be medically for CIR tomorrow (Saturday) pending insurance approval. Family is aware of the possible plan.  I will share update later today as able. Please call me with any questions. Thanks.  Nanetta Batty, PT Rehabilitation Admissions Coordinator [phone]

## 2015-04-01 NOTE — Op Note (Signed)
INDICATIONS: aortic atherosclerosis and embolic stroke  PROCEDURE:   Informed consent was obtained prior to the procedure. The risks, benefits and alternatives for the procedure were discussed and the patient comprehended these risks.  Risks include, but are not limited to, cough, sore throat, vomiting, nausea, somnolence, esophageal and stomach trauma or perforation, bleeding, low blood pressure, aspiration, pneumonia, infection, trauma to the teeth and death.    After a procedural time-out, the oropharynx was anesthetized with 20% benzocaine spray. The patient was given 2 mg versed and 25 mcg fentanyl for moderate sedation.   The transesophageal probe was inserted in the esophagus and stomach without difficulty and multiple views were obtained.  The patient was kept under observation until the patient left the procedure room.  The patient left the procedure room in stable condition.   Agitated microbubble saline contrast was administered.  COMPLICATIONS:    There were no immediate complications.  FINDINGS:  No cardiac source of embolism. 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  RECOMMENDATIONS:    Statin and antiplatelet therapy  Time Spent Directly with the Patient:  60 minutes   Vrinda Heckstall 04/01/2015, 1:51 PM

## 2015-04-01 NOTE — Progress Notes (Signed)
CARE MANAGEMENT NOTE 04/01/2015  Patient:  Laura Melendez, Laura Melendez   Account Number:  [id number]  Date Initiated:  03/31/2015  Documentation initiated by:  Lorne Skeens  Subjective/Objective Assessment:   Patient was admitted with CVA.     Action/Plan:   Will follow for discharge needs pending PT/OT evals and physician orders.   Anticipated DC Date:  04/01/2015   Anticipated DC Plan:  IP REHAB FACILITY  In-house referral  Clinical Social Worker         Choice offered to / List presented to:             Status of service:  In process, will continue to follow Medicare Important Message given?  YES (If response is "NO", the following Medicare IM given date fields will be blank) Date Medicare IM given:  04/01/2015 Medicare IM given by:  Lorne Skeens Date Additional Medicare IM given:   Additional Medicare IM given by:    Discharge Disposition:    Per UR Regulation:  Reviewed for med. necessity/level of care/duration of stay  If discussed at [place] of Stay Meetings, dates discussed:    Comments:  04/01/15 [place], MSN, CM- Medicare IM letter provided

## 2015-04-01 NOTE — Clinical Social Work Note (Signed)
Clinical Social Worker noted patient has been accepted and received [facility] [facility] approval by CIR/ IP REHAB. Patient to be admitted into IP [facility] tomorrow- Saturday, 4/16.  Clinical Social Worker will sign off for now as social work intervention is no longer needed. Please consult [place] again if new need arises.  Glendon Axe, MSW, LCSWA [phone] 04/01/2015 3:53 PM

## 2015-04-01 NOTE — Progress Notes (Signed)
Physical Therapy Treatment Patient Details Name: Laura Melendez MRN: [id number] DOB: [birth year] Today's Date: 04/01/2015    History of Present Illness This 74 y.o. female amitted with MS changes incontinence and Rt LE weakness.  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  MRI of head showed subcortical punctate acute infarcts high medial Lt frontal lobe.  PMH includes:  DM; HTN; DDD lumbar spine    PT Comments    No evidence of activation of RUE or RLE throughout session.  Pt w/ minimal verbalization and appears fatigued toward end of session.  Max assist required for all mobility for safety and physical assist.  PT recommending CIR placement 2/2 pt's willingness to participate in therapy and pt will benefit from intensive physical therapy to increase functional independence.   Follow Up Recommendations  CIR;Supervision/Assistance - 24 hour     Equipment Recommendations   (TBD by next venue of care)    Recommendations for Other Services       Precautions / Restrictions Precautions Precautions: Fall Restrictions Weight Bearing Restrictions: No    Mobility  Bed Mobility Overal bed mobility: Needs Assistance;+2 for physical assistance Bed Mobility: Sidelying to Sit;Rolling Rolling: Mod assist;+2 for physical assistance Sidelying to sit: Max assist;+2 for physical assistance;HOB elevated       General bed mobility comments: Max assist for sequencing and tactile cues for hand placement.  Pt requires assist using bed pad to roll to R side and max assist w/ BLEs and trunk to sit upright EOB.  Transfers Overall transfer level: Needs assistance Equipment used: None Transfers: Sit to/from [facility] Sit to Stand: Max assist;+2 safety/equipment Stand pivot transfers: Max assist;+2 safety/equipment       General transfer comment: Max assist w/ R knee block for sit>stand and stand pivot.  Pt leaning posteriorly sitting EOB and standing.  No  evidence of RLE muscle recruitment thorughout session but shuffles LLE during stand pivot.  Max cues for hand placement for safety.  Ambulation/Gait             General Gait Details: Pivotal shuffle w/ max assist only   Stairs            Wheelchair Mobility    Modified Rankin (Stroke Patients Only) Modified Rankin (Stroke Patients Only) Pre-Morbid Rankin Score: No symptoms Modified Rankin: Severe disability     Balance Overall balance assessment: Needs assistance Sitting-balance support: Bilateral upper extremity supported;Feet supported Sitting balance-Leahy Scale: Zero Sitting balance - Comments: Sat EOB for several minutes and performed L LAQ w/ max assist w/ trunk posteriorly.  Pt unable to maintain balance despite verbal and tactile cues for hand placement and cues to lean anteriorly. Postural control: Posterior lean;Right lateral lean Standing balance support: Bilateral upper extremity supported;During functional activity Standing balance-Leahy Scale: Zero Standing balance comment: Posterior lean and requires max assist to maintain upright                    Cognition Arousal/Alertness: Lethargic Behavior During Therapy: Flat affect Overall Cognitive Status: Impaired/Different from baseline Area of Impairment: Attention;Following commands;Problem solving;Awareness;Safety/judgement   Current Attention Level: Sustained   Following Commands: Follows one step commands with increased time;Follows one step commands inconsistently Safety/Judgement: Decreased awareness of safety;Decreased awareness of deficits Awareness: Intellectual Problem Solving: Slow processing;Decreased initiation;Difficulty sequencing;Requires verbal cues;Requires tactile cues General Comments: Pt requires increased time to initiate movement and does not follow all commands    Exercises General Exercises - Upper Extremity Elbow Flexion:  AAROM;Left;Seated;Limitations Elbow Flexion  Limitations: total assist w/ bringing RUE to rest on PT's shoulders, Max assist to brining LUE to rest on PT's shoulders w/ shoulder flexion.  Pt demonstrated ability to lightly push LUE into PT's shoulder w/ verbal instruction.  No evidence of muscle contraction on RUE throughout session. General Exercises - Lower Extremity Long Arc Quad: Left;AROM;5 reps;Seated Toe Raises: AROM;Left;5 reps;Seated    General Comments General comments (skin integrity, edema, etc.): Pt w/ minimal vocalization throughout session but rather acknowledges yes and no questions w/ nodding/shaking head.      Pertinent Vitals/Pain Pain Assessment: No/denies pain    Home Living Family/patient expects to be discharged to:: Private residence Living Arrangements: Spouse/significant other Available Help at Discharge: Family                Prior Function            PT Goals (current goals can now be found in the care plan section) Acute Rehab PT Goals Patient Stated Goal: none stated Progress towards PT goals: Progressing toward goals    Frequency  Min 4X/week    PT Plan Current plan remains appropriate    Co-evaluation             End of Session Equipment Utilized During Treatment: Gait belt Activity Tolerance: Patient limited by fatigue;Patient limited by lethargy Patient left: in chair;with call bell/phone within reach;with chair alarm set     Time: 1572-6203 PT Time Calculation (min) (ACUTE ONLY): 37 min  Charges:  $Therapeutic Exercise: 8-22 mins $Therapeutic Activity: 8-22 mins                    G Codes:      Joslyn Hy PT, DPT [phone] Pager: [phone]  04/01/2015, 12:43 PM

## 2015-04-01 NOTE — Progress Notes (Addendum)
Patient Demographics  Laura Melendez, is a 74 y.o. female, DOB - 10-26-1941, BJY:[id number]  Admit date - 03/30/2015   Admitting Physician Costin Karlyne Greenspan, MD  Outpatient Primary MD for the patient is LUKING,SCOTT, MD  LOS - 2   Chief Complaint  Patient presents with  . Extremity Weakness      Summary  74 year old Caucasian female with history of smoking, dyslipidemia, type 2 diabetes and essential hypertension admitted to the hospital with dense right-sided hemiparesis which is her dominant side along with specific aphasia. Secondary to left frontal lobe multiple infarcts. There is a question of descending aorta clot, TEE has been ordered. Neurology-vascular surgery has been consulted. Cardiothoracic surgery has been called away to hear back their opinion. She most likely will require placement. Continues to have dense right-sided hemiparesis. For now on aspirin and statin.   Subjective:   Laura Melendez today has, No headache, No chest pain, No abdominal pain - No Nausea, No new weakness tingling or numbness, No Cough - SOB.  Continues to have extreme right-sided weakness. Speech has improved a little bit.  Assessment & Plan    1. Left frontal lobe multiple infarcts with dense right-sided weakness and some aphasia/dysarthria. Currently on full dose aspirin along with increased statin dose. Full stroke workup underway. Neurology following. Currently on aspirin full dose and statin which was increased for better LDL control. Kindly see #2 below. TTE is stable. TEE pending.  Addendum. Was called by a vascular surgeon Dr. Roxan Hockey. Patient likely does have a clot in her aorta, recommended to start on Coumadin. Discussed with neurology. Okay for Coumadin. Pharmacy requested to dose Coumadin. Stop  aspirin once INR 2.   2. CT angiogram of the neck and chest noted- showing possible plaque or thrombus in the descending aorta close to the arch along with some left CCA stenosis. Discussed with neurology continue aspirin for now full dose along with statin dose which has been increased for better LDL control. Counseled to quit smoking.  Vascular surgery has been consulted, and no surgical intervention or changes suggested by vascular surgery. Have also requested cardiac thoracic surgery to evaluate her CT chest and call back. Dr. Roxan Hockey in over Will call.   3. Dyslipidemia. Increased statin for better LDL control.   4. Hypertension on beta blocker and low-dose ACE inhibitor continue.   5. Smoking. Counseled to quit. She apparently stopped around 6-7 months ago.   6.DM2 - A1c 6.9, on sliding scale for now, was on metformin only at home will likely require addition of oral hypoglycemic agent upon discharge to home. For now continue sliding scale  CBG (last 3)   Recent Labs  03/31/15 1705 03/31/15 2223 04/01/15 0549  GLUCAP 96 122* 129*     Lab Results  Component Value Date   HGBA1C 6.9* 03/31/2015    Code Status: Full  Family Communication: husband  Disposition Plan: SNF/CIR   Procedures   MRI/MRA brain, CT brain. Echogram. CTA Neck , TEE pending  TTE  - Left ventricle: The cavity size was normal. Wall thickness was normal. Systolic function was normal. The estimated ejection fraction was in the range of 50% to 55%. Doppler parameters are consistent with abnormal left ventricular relaxation (grade 1 diastolic dysfunction). 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   Consults  Neuro, vascular surgery, cardiology for TEE, consulted cardiothoracic surgeon Dr. Roxan Hockey over the phone he will call back   Medications  Scheduled Meds: .  stroke: mapping our early stages of recovery book   Does not apply Once  .  aspirin EC  325 mg Oral Daily  . calcium-vitamin D  1 tablet Oral BID WC  . heparin  5,000 Units Subcutaneous 3 times per day  . insulin aspart  0-9 Units Subcutaneous TID WC  . lisinopril  2.5 mg Oral Daily  . metoprolol  50 mg Oral BID  . multivitamin with minerals  1 tablet Oral Daily  . NIFEdipine  60 mg Oral Daily  . omega-3 acid ethyl esters  1 g Oral BID  . pravastatin  40 mg Oral q1800  . raloxifene  60 mg Oral Daily   Continuous Infusions:  PRN Meds:.senna-docusate  DVT Prophylaxis    Heparin   Lab Results  Component Value Date   PLT 204 03/30/2015    Antibiotics    Anti-infectives    None          Objective:   Filed Vitals:   03/31/15 2134 04/01/15 0058 04/01/15 0550 04/01/15 0943  BP: 146/57 127/76 152/56 143/55  Pulse: 81 61 60 70  Temp: 98.5 F (36.9 C) 97.7 F (36.5 C) 97.9 F (36.6 C) 98.3 F (36.8 C)  TempSrc: Oral Oral Oral Oral  Resp: 20 18 16 16   Height:      Weight:      SpO2: 96% 97% 98% 97%    Wt Readings from Last 3 Encounters:  03/30/15 45.36 kg (100 lb)  02/15/15 45.813 kg (101 lb)  01/31/15 44.7 kg (98 lb 8.7 oz)     Intake/Output Summary (Last 24 hours) at 04/01/15 1054 Last data filed at 03/31/15 1700  Gross per 24 hour  Intake    440 ml  Output      0 ml  Net    440 ml     Physical Exam  Awake Alert, Oriented X 3, No new F.N deficits, Normal affect, Dense right-sided hemiparesis with mild expressive aphasia Nash.AT,PERRAL Supple Neck,No JVD, No cervical lymphadenopathy appriciated.  Symmetrical Chest wall movement, Good air movement bilaterally, CTAB RRR,No Gallops,Rubs or new Murmurs, No Parasternal Heave +ve B.Sounds, Abd Soft, No tenderness, No organomegaly appriciated, No rebound - guarding or rigidity. No Cyanosis, Clubbing or edema, No new Rash or bruise      Data Review   Micro Results No results found for this or any previous visit (from the past 240 hour(s)).  Radiology Reports Ct Head Wo  Contrast  03/30/2015   ADDENDUM REPORT: 03/30/2015 15:35  ADDENDUM: Critical Value/emergent results were called by telephone at the time of interpretation on 03/30/2015 at 3:35 pm to Dr. Aram Beecham, neurology , who verbally acknowledged these results.   Electronically Signed   By: Lowella Grip III M.D.   On: 03/30/2015 15:35   03/30/2015   CLINICAL DATA:  Acute onset right lower extremity weakness  EXAM: CT HEAD WITHOUT CONTRAST  TECHNIQUE: Contiguous axial images were obtained from the base of the skull through the vertex without intravenous contrast.  COMPARISON:  January 29, 2015  FINDINGS: There is mild diffuse atrophy. There is no intracranial mass hemorrhage, extra-axial fluid collection, or midline shift. There is evidence of a prior small infarct in the mid left cerebellum, stable. There is small vessel  disease throughout the periventricular white matter bilaterally. There is a new area of decreased attenuation in the posterior right corona radiata which may represent an acute/recent small white matter infarct in this area. No other new gray-white compartment lesion identified. There is small vessel disease in the anterior limb of the left internal capsule, stable. There is also a prior small infarct in the right thalamus, stable. Bony calvarium appears intact. The mastoid air cells are clear. The middle cerebral arteries have symmetric attenuation and appear stable bilaterally.  IMPRESSION: Suspect new small white matter infarct in the right corona radiata. Extensive supratentorial/periventricular small vessel disease remains. Prior small infarct in the anterior limb of the left internal capsule as well as in the right thalamus and left cerebellum remains stable. No hemorrhage or mass effect.  Electronically Signed: By: Lowella Grip III M.D. On: 03/30/2015 15:25   Ct Angio Neck W/cm &/or Wo/cm  03/31/2015   CLINICAL DATA:  74 year old female with small acute left frontal lobe infarcts diagnosed  on MRI during evaluation of right lower extremity weakness. Initial encounter.  EXAM: CT ANGIOGRAPHY NECK  TECHNIQUE: Multidetector CT imaging of the neck was performed using the standard protocol during bolus administration of intravenous contrast. Multiplanar CT image reconstructions and MIPs were obtained to evaluate the vascular anatomy. Carotid stenosis measurements (when applicable) are obtained utilizing NASCET criteria, using the distal internal carotid diameter as the denominator.  CONTRAST:  15mL OMNIPAQUE IOHEXOL 350 MG/ML SOLN  COMPARISON:  Brain MRI and intracranial MRA 03/30/2015, and earlier  FINDINGS: Aortic arch: The aortic arch is not entirely included on these images. There is a bovine arch configuration. There is moderate to severe arch atherosclerosis including mural plaque or thrombus up to 8 mm in the distal arch.  Right carotid system: No brachiocephalic or right CCA origin stenosis. Mildly tortuous right CCA with a partially retropharyngeal course. Soft and calcified plaque at the right carotid bifurcation but no right ICA origin stenosis. Tortuous right ICA with a partially retropharyngeal course, otherwise negative to the skullbase. Visible right ICA siphon is patent with calcified plaque.  Left carotid system: Calcified plaque at the left CCA origin resulting in up to 50 % stenosis with respect to the distal vessel (coronal image 144). Beyond its origin the left CCA is mildly tortuous. There is calcified plaque at the left carotid bifurcation and involving the posterior left ICA bulb but not resulting in left ICA origin stenosis. Tortuous left ICA just beyond the bulb with a mildly kinked appearance. Otherwise negative to the skullbase. Visible left ICA siphon is patent with calcified plaque.  Vertebral arteries:  Calcified plaque at the right subclavian artery origin resulting in stenosis of less than 50 % with respect to the distal vessel . Calcified plaque at the right vertebral artery  origin with mild stenosis. Otherwise negative right vertebral artery to the vertebrobasilar junction. Normal right PICA origin.  Calcified plaque in the left subclavian artery origin with stenosis up to 60 % with respect to the distal vessel. Mild calcified plaque at the left vertebral artery origin with mild stenosis. The proximal left vertebral artery is tortuous. The vertebral arteries are codominant. The left is otherwise negative to the vertebrobasilar junction. Dominant appearing left AICA.  Skeleton: Largely absent dentition. Degenerative changes in the cervical spine. Thoracic scoliosis. No acute osseous abnormality identified. Mild bubbly opacity in the right posterior ethmoid air cells. Other Visualized paranasal sinuses and mastoids are clear.  Other neck: Thyromegaly with bilateral less than 15  mm hypodense thyroid nodules. Larynx, pharynx, parapharyngeal spaces and sublingual space are within normal limits. Negative retropharyngeal space aside from a retropharyngeal course of the carotids, more so the right. Submandibular and parotid glands are within normal limits. Negative orbits soft tissues.  No cervical lymphadenopathy.  Dependent atelectasis in the left upper lobe. No superior mediastinal lymphadenopathy.  IMPRESSION: 1. 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 No other Hemodynamically significant stenosis of the carotid or vertebral arteries in the neck. 2. 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   Electronically Signed   By: Genevie Ann M.D.   On: 03/31/2015 07:21   Mr Jodene Nam Head Wo Contrast  03/30/2015   CLINICAL DATA:  Right leg weakness.  EXAM: MRI HEAD WITHOUT CONTRAST  MRA HEAD WITHOUT CONTRAST  TECHNIQUE: Multiplanar, multiecho pulse sequences of the brain and surrounding structures were obtained without intravenous contrast. Angiographic images of the head were obtained using MRA technique  without contrast.  COMPARISON:  Head CT 03/30/2015 and MRI 06/21/2005  FINDINGS: MRI HEAD FINDINGS  There are several punctate acute infarcts in the high, medial left frontal lobe which are predominantly subcortical in location. There is no evidence of intracranial hemorrhage, mass, midline shift, or extra-axial fluid collection. There is mild generalized cerebral atrophy. Patchy and confluent T2 hyperintensities in the periventricular greater than subcortical cerebral white matter have increased from the prior MRI and are nonspecific but compatible with moderate chronic small vessel ischemic disease. Chronic lacunar infarcts are noted in the left corona radiata and right greater than left thalami. A small, chronic left cerebellar infarct is also noted.  Prior bilateral cataract extraction is noted. Paranasal sinuses mastoid air cells are clear. Major intracranial vascular flow voids are preserved.  MRA HEAD FINDINGS  Images are mildly to moderately motion degraded. Visualized distal vertebral arteries are patent and codominant without stenosis. Right PICA origin is patent. Left PICA is not identified. AICA and SCA origins are patent. Basilar artery is patent without stenosis. There is a fetal origin of the left PCA. There is moderate right P1 segment stenosis.  Internal carotid arteries are patent from skullbase to carotid termini without evidence of significant stenosis. M1 and A1 segments are patent without stenosis. A2 segments and MCA branch vessels are suboptimally evaluated due to motion artifact but appear patent without significant proximal stenosis identified. No intracranial aneurysm is identified.  IMPRESSION: 1. Punctate acute left frontal lobe infarcts. 2. Moderate chronic small vessel ischemic disease and chronic lacunar infarcts as above. 3. No major intracranial arterial occlusion or significant proximal anterior circulation stenosis. Moderate proximal right PCA stenosis.   Electronically Signed    By: Logan Bores   On: 03/30/2015 16:52   Mr Brain Wo Contrast  03/30/2015   CLINICAL DATA:  Right leg weakness.  EXAM: MRI HEAD WITHOUT CONTRAST  MRA HEAD WITHOUT CONTRAST  TECHNIQUE: Multiplanar, multiecho pulse sequences of the brain and surrounding structures were obtained without intravenous contrast. Angiographic images of the head were obtained using MRA technique without contrast.  COMPARISON:  Head CT 03/30/2015 and MRI 06/21/2005  FINDINGS: MRI HEAD FINDINGS  There are several punctate acute infarcts in the high, medial left frontal lobe which are predominantly subcortical in location. There is no evidence of intracranial hemorrhage, mass, midline shift, or extra-axial fluid collection. There is mild generalized cerebral atrophy. Patchy and confluent T2 hyperintensities in the periventricular greater  than subcortical cerebral white matter have increased from the prior MRI and are nonspecific but compatible with moderate chronic small vessel ischemic disease. Chronic lacunar infarcts are noted in the left corona radiata and right greater than left thalami. A small, chronic left cerebellar infarct is also noted.  Prior bilateral cataract extraction is noted. Paranasal sinuses mastoid air cells are clear. Major intracranial vascular flow voids are preserved.  MRA HEAD FINDINGS  Images are mildly to moderately motion degraded. Visualized distal vertebral arteries are patent and codominant without stenosis. Right PICA origin is patent. Left PICA is not identified. AICA and SCA origins are patent. Basilar artery is patent without stenosis. There is a fetal origin of the left PCA. There is moderate right P1 segment stenosis.  Internal carotid arteries are patent from skullbase to carotid termini without evidence of significant stenosis. M1 and A1 segments are patent without stenosis. A2 segments and MCA branch vessels are suboptimally evaluated due to motion artifact but appear patent without significant  proximal stenosis identified. No intracranial aneurysm is identified.  IMPRESSION: 1. Punctate acute left frontal lobe infarcts. 2. Moderate chronic small vessel ischemic disease and chronic lacunar infarcts as above. 3. No major intracranial arterial occlusion or significant proximal anterior circulation stenosis. Moderate proximal right PCA stenosis.   Electronically Signed   By: Logan Bores   On: 03/30/2015 16:52   Ct Angio Chest Aorta W/cm &/or Wo/cm  03/31/2015   CLINICAL DATA:  Altered mental status, evaluate for aortic thrombus  EXAM: CT ANGIOGRAPHY CHEST WITH CONTRAST  TECHNIQUE: Multidetector CT imaging of the chest was performed using the standard protocol during bolus administration of intravenous contrast. Multiplanar CT image reconstructions and MIPs were obtained to evaluate the vascular anatomy.  CONTRAST:  161mL OMNIPAQUE IOHEXOL 300 MG/ML  SOLN  COMPARISON:  None.  FINDINGS: Unenhanced CT, there is no evidence of mediastinal hematoma.  No evidence of thoracic aortic aneurysm or dissection.  Atherosclerotic calcifications of the aortic arch/descending thoracic aorta. Eccentric mural thrombus along the proximal descending thoracic aorta (series 4/image 23) and along the distal descending thoracic aorta (series 4/ image 78).  Although not tailored for evaluation of the pulmonary arteries, there is no evidence of pulmonary embolism to the segmental level.  Mediastinum/Nodes: Heart is top-normal in size. No pericardial effusion.  No suspicious mediastinal, hilar, or axillary lymphadenopathy.  Visualized thyroid is enlarged/ nodular.  Lungs/Pleura: Mild dependent atelectasis in the bilateral upper lobes.  No suspicious pulmonary nodules.  No focal consolidation.  No pleural effusion or pneumothorax.  Upper abdomen: Visualized upper abdomen is within normal limits.  Musculoskeletal: Mild thoracic dextroscoliosis.  Review of the MIP images confirms the above findings.  IMPRESSION: No overt cardioembolic  source identified. 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  No evidence of thoracic aortic aneurysm or dissection.  No evidence of pulmonary embolism.   Electronically Signed   By: Julian Hy M.D.   On: 03/31/2015 21:35     CBC  Recent Labs Lab 03/30/15 1212 03/30/15 1217  WBC 6.2  --   HGB 13.4 14.6  HCT 40.9 43.0  PLT 204  --   MCV 81.5  --   MCH 26.7  --   MCHC 32.8  --   RDW 14.4  --   LYMPHSABS 2.1  --   MONOABS 0.4  --   EOSABS 0.1  --   BASOSABS 0.1  --  Chemistries   Recent Labs Lab 03/30/15 1212 03/30/15 1217  NA 142 142  K 3.9 3.9  CL 105 105  CO2 26  --   GLUCOSE 133* 132*  BUN 13 15  CREATININE 0.58 0.60  CALCIUM 9.7  --   AST 22  --   ALT 16  --   ALKPHOS 40  --   BILITOT 0.5  --    ------------------------------------------------------------------------------------------------------------------ estimated creatinine clearance is 44.2 mL/min (by C-G formula based on Cr of 0.6). ------------------------------------------------------------------------------------------------------------------  Recent Labs  03/30/15 1805 03/31/15 0625  HGBA1C 7.0* 6.9*   ------------------------------------------------------------------------------------------------------------------  Recent Labs  03/31/15 0625  CHOL 166  HDL 41  LDLCALC 91  TRIG 170*  CHOLHDL 4.0   ------------------------------------------------------------------------------------------------------------------ No results for input(s): TSH, T4TOTAL, T3FREE, THYROIDAB in the last 72 hours.  Invalid input(s): FREET3 ------------------------------------------------------------------------------------------------------------------ No results for input(s): VITAMINB12, FOLATE, FERRITIN, TIBC, IRON, RETICCTPCT in the last 72 hours.  Coagulation profile  Recent Labs Lab 03/30/15 1212 03/31/15 1224   INR 1.03 1.07    No results for input(s): DDIMER in the last 72 hours.  Cardiac Enzymes No results for input(s): CKMB, TROPONINI, MYOGLOBIN in the last 168 hours.  Invalid input(s): CK ------------------------------------------------------------------------------------------------------------------ Invalid input(s): POCBNP     Time Spent in minutes  35   Lala Lund K M.D on 04/01/2015 at 10:54 AM  Between 7am to 7pm - Pager - [phone]  After 7pm go to [email or web address] - password [facility]  [facility]   Office  [phone]

## 2015-04-01 NOTE — Progress Notes (Signed)
Rehab admissions - I am following pt's case and we did receive insurance approval from [facility] for inpatient rehab. I had spoken with Dr. Candiss Norse earlier today and he stated pt would be medically ready for CIR tomorrow. We have an available rehab bed tomorrow and will plan to admit pt tomorrow (Saturday 04-02-15) pending her medical clearance from MD tomorrow.  I called and updated pt's dtr and will meet with pt/husband shortly to complete admission paperwork soon. I updated Loma Sousa, case Freight forwarder and she will update Teacher, English as a foreign language, Education officer, museum.  Please call me with any questions. Thanks.  Nanetta Batty, PT Rehabilitation Admissions Coordinator [phone]

## 2015-04-01 NOTE — Progress Notes (Signed)
ANTICOAGULATION CONSULT NOTE - Initial Consult  Pharmacy Consult for warfarin Indication: aortic clot  No Known Allergies  Patient Measurements: Height: 5\' 1"  (154.9 cm) Weight: 100 lb (45.36 kg) IBW/kg (Calculated) : 47.8 Heparin Dosing Weight:   Vital Signs: Temp: 99.7 F (37.6 C) (04/15 1234) Temp Source: Oral (04/15 1234) BP: 159/90 mmHg (04/15 1340) Pulse Rate: 56 (04/15 1340)  Labs:  Recent Labs  03/30/15 1212 03/30/15 1217 03/31/15 1224  HGB 13.4 14.6  --   HCT 40.9 43.0  --   PLT 204  --   --   APTT 30  --   --   LABPROT 13.6  --  14.0  INR 1.03  --  1.07  CREATININE 0.58 0.60  --     Estimated Creatinine Clearance: 44.2 mL/min (by C-G formula based on Cr of 0.6).   Medical History: Past Medical History  Diagnosis Date  . Essential hypertension, benign   . Type 2 diabetes mellitus   . Osteoporosis 06/2008  . Stroke     Medications:  Prescriptions prior to admission  Medication Sig Dispense Refill Last Dose  . aspirin EC 81 MG tablet Take 81 mg by mouth daily.   03/29/2015 at Unknown time  . Calcium Carb-Cholecalciferol (CALCIUM 600/VITAMIN D3) 600-800 MG-UNIT TABS Take 1 tablet by mouth 2 (two) times daily with a meal.   03/29/2015 at Unknown time  . CINNAMON PO Take 1,000 mg by mouth daily.   03/29/2015 at Unknown time  . CRANBERRY PO Take 84 mg by mouth daily.   03/29/2015 at Unknown time  . lisinopril (PRINIVIL,ZESTRIL) 2.5 MG tablet Take 1 tablet (2.5 mg total) by mouth daily. 90 tablet 3 03/29/2015 at Unknown time  . metFORMIN (GLUCOPHAGE) 500 MG tablet Take 1 tablet (500 mg total) by mouth 2 (two) times daily with a meal. 180 tablet 3 03/29/2015 at Unknown time  . metoprolol (LOPRESSOR) 50 MG tablet Take 1 tablet (50 mg total) by mouth 2 (two) times daily. 180 tablet 3 03/29/2015 at 0900  . Multiple Vitamin (MULTIVITAMIN WITH MINERALS) TABS Take 1 tablet by mouth daily.   03/29/2015 at Unknown time  . naproxen sodium (ANAPROX) 220 MG tablet Take 220 mg  by mouth daily as needed (pain).   Past Month at Unknown time  . NIFEdipine (PROCARDIA XL/ADALAT-CC) 60 MG 24 hr tablet Take 1 tablet (60 mg total) by mouth daily. 90 tablet 3 03/29/2015 at Unknown time  . Omega-3 Fatty Acids (FISH OIL CONCENTRATE) 300 MG CAPS Take 1 capsule by mouth daily.   03/29/2015 at Unknown time  . pravastatin (PRAVACHOL) 20 MG tablet Take 1 tablet (20 mg total) by mouth daily. (Patient taking differently: Take 20 mg by mouth at bedtime. ) 30 tablet 2 03/29/2015 at Unknown time  . raloxifene (EVISTA) 60 MG tablet Take 1 tablet (60 mg total) by mouth daily. 90 tablet 3 03/29/2015 at Unknown time  . alendronate (FOSAMAX) 70 MG tablet Take 1 tablet (70 mg total) by mouth every 7 (seven) days. 12 tablet 3 03/22/2015    Assessment: 24 yof presented to the hospital with extremity weakness. Found to have multiple L frontal lobe infarcts. Also likely an aortic clot. To start warfarin for anticoagulation. Baseline INR and CBC are WNL. She is also on full dose aspirin and heparin SQ.  Goal of Therapy:  INR 2-3 Monitor platelets by anticoagulation protocol: Yes   Plan:  - Warfarin 5mg  PO x 1 tonight - Daily INR - Warfarin book +  video to patient - DC heparin SQ and aspirin when INR is therapeutic  Altair Appenzeller, Rande Lawman 04/01/2015,1:46 PM

## 2015-04-01 NOTE — Progress Notes (Signed)
   I reviewed the CTA chest ordered over night.  Demonstrates bovine arch with no evidence of thrombus until distal to the L SCA takeover.  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  There is also atherosclerosis and thrombus in the supraceliac aorta.  - In general, I recommend anticoagulation in patient with extensive atherosclerosis with associated thrombus to try to assist the patient's innate thrombolytic system.  - If she develops acute embolism from any of her thrombus, that will need to be managed as indicated.  - The only question is whether the patient is at acceptable risk for intracranial hemorrhage on anticoagulation given her residual weakness in her right side.  I will defer to the primary team and neurology that question.  - There is no surgical indication at this point.  - Patient can follow up me in the office after 6 months of anticoagulation for a repeat CTA chest/abd/pelvis to check the progress of the thrombus in her aorta.   Adele Barthel, MD [facility] Office: [phone] Pager: [phone]  04/01/2015, 3:03 PM

## 2015-04-01 NOTE — ED Provider Notes (Signed)
CSN: [id number]     Arrival date & time 03/30/15  1137 History   First MD Initiated Contact with Patient 03/30/15 1153     Chief Complaint  Patient presents with  . Extremity Weakness      HPI  Asian presents for evaluation with right leg weakness. She waking this morning and realized she could not lift her leg from the bed. Has some dementia. Ultimately were able to talk with her husband. She was last seen upright last night. Awakened with the symptoms this morning.  Prior history of stroke. Does have history of hypertension diabetes.    Denies headache. Denies upper extremity weakness. No episodes of coughing or choking per her report her husband.  Past Medical History  Diagnosis Date  . Essential hypertension, benign   . Type 2 diabetes mellitus   . Osteoporosis 06/2008   Past Surgical History  Procedure Laterality Date  . Cholecystectomy    . Ankle surgery Left 2004    otif  . Abdominal hysterectomy    . Anterior and posterior repair N/A 03/11/2013    Procedure: ANTERIOR (CYSTOCELE) AND POSTERIOR REPAIR (RECTOCELE);  Surgeon: Linda Hedges, DO;  Location: [facility];  Service: Gynecology;  Laterality: N/A;  with sacrospinous ligament fixation bilateral  . Colonoscopy  2009    negative  . Cataract extraction w/phaco Right 12/27/2014    Procedure: CATARACT EXTRACTION PHACO AND INTRAOCULAR LENS PLACEMENT RIGHT EYE;  Surgeon: Tonny Branch, MD;  Location: [facility];  Service: Ophthalmology;  Laterality: Right;  CDE:10.63  . Cataract extraction w/phaco Left 01/13/2015    Procedure: CATARACT EXTRACTION PHACO AND INTRAOCULAR LENS PLACEMENT LEFT EYE;  Surgeon: Tonny Branch, MD;  Location: [facility];  Service: Ophthalmology;  Laterality: Left;  CDE:18.87   Family History  Problem Relation Age of Onset  . Cancer Father     Liver  . Cancer Sister     Breast  . Diabetes Brother    History  Substance Use Topics  . Smoking status: Former Smoker -- 0.25 packs/day for 30 years    Types: Cigarettes     Quit date: 02/27/2015  . Smokeless tobacco: Not on file  . Alcohol Use: No   OB History    No data available     Review of Systems  Constitutional: Negative for fever, chills, diaphoresis, appetite change and fatigue.  HENT: Negative for mouth sores, sore throat and trouble swallowing.   Eyes: Negative for visual disturbance.  Respiratory: Negative for cough, chest tightness, shortness of breath and wheezing.   Cardiovascular: Negative for chest pain.  Gastrointestinal: Negative for nausea, vomiting, abdominal pain, diarrhea and abdominal distention.  Endocrine: Negative for polydipsia, polyphagia and polyuria.  Genitourinary: Negative for dysuria, frequency and hematuria.  Musculoskeletal: Negative for gait problem.  Skin: Negative for color change, pallor and rash.  Neurological: Positive for weakness. Negative for dizziness, syncope, light-headedness and headaches.  Hematological: Does not bruise/bleed easily.  Psychiatric/Behavioral: Negative for behavioral problems and confusion.      Allergies  Review of patient's allergies indicates no known allergies.  Home Medications   Prior to Admission medications   Medication Sig Start Date End Date Taking? Authorizing Provider  aspirin EC 81 MG tablet Take 81 mg by mouth daily.   Yes Historical Provider, MD  Calcium Carb-Cholecalciferol (CALCIUM 600/VITAMIN D3) 600-800 MG-UNIT TABS Take 1 tablet by mouth 2 (two) times daily with a meal.   Yes Historical Provider, MD  CINNAMON PO Take 1,000 mg by mouth daily.  Yes Historical Provider, MD  CRANBERRY PO Take 84 mg by mouth daily.   Yes Historical Provider, MD  lisinopril (PRINIVIL,ZESTRIL) 2.5 MG tablet Take 1 tablet (2.5 mg total) by mouth daily. 09/24/13  Yes [name], MD  metFORMIN (GLUCOPHAGE) 500 MG tablet Take 1 tablet (500 mg total) by mouth 2 (two) times daily with a meal. 09/24/13  Yes [name], MD  metoprolol (LOPRESSOR) 50 MG tablet Take 1 tablet (50 mg  total) by mouth 2 (two) times daily. 09/24/13  Yes [name], MD  Multiple Vitamin (MULTIVITAMIN WITH MINERALS) TABS Take 1 tablet by mouth daily.   Yes Historical Provider, MD  naproxen sodium (ANAPROX) 220 MG tablet Take 220 mg by mouth daily as needed (pain).   Yes Historical Provider, MD  NIFEdipine (PROCARDIA XL/ADALAT-CC) 60 MG 24 hr tablet Take 1 tablet (60 mg total) by mouth daily. 09/24/13  Yes [name], MD  Omega-3 Fatty Acids (FISH OIL CONCENTRATE) 300 MG CAPS Take 1 capsule by mouth daily.   Yes Historical Provider, MD  pravastatin (PRAVACHOL) 20 MG tablet Take 1 tablet (20 mg total) by mouth daily. Patient taking differently: Take 20 mg by mouth at bedtime.  02/22/15  Yes [name], MD  raloxifene (EVISTA) 60 MG tablet Take 1 tablet (60 mg total) by mouth daily. 09/24/13  Yes [name], MD  alendronate (FOSAMAX) 70 MG tablet Take 1 tablet (70 mg total) by mouth every 7 (seven) days. 09/24/13   [name], MD   BP 152/56 mmHg  Pulse 60  Temp(Src) 97.9 F (36.6 C) (Oral)  Resp 16  Ht 5\' 1"  (1.549 m)  Wt 100 lb (45.36 kg)  BMI 18.90 kg/m2  SpO2 98% Physical Exam  Constitutional: She is oriented to person, place, and time. She appears well-developed and well-nourished. No distress.  HENT:  Head: Normocephalic.  Eyes: Conjunctivae are normal. Pupils are equal, round, and reactive to light. No scleral icterus.  Neck: Normal range of motion. Neck supple. No thyromegaly present.  Cardiovascular: Normal rate and regular rhythm.  Exam reveals no gallop and no friction rub.   No murmur heard. Pulmonary/Chest: Effort normal and breath sounds normal. No respiratory distress. She has no wheezes. She has no rales.  Abdominal: Soft. Bowel sounds are normal. She exhibits no distension. There is no tenderness. There is no rebound.  Musculoskeletal: Normal range of motion.  Neurological: She is alert and oriented to person, place, and time.  Minimally verbal, liver no  slurring. No drift of her upper extremities. No facial droop no drooling. No abnormalities on swallow screen. Right leg weakness 1 out of 5. Normal left leg strength.  Skin: Skin is warm and dry. No rash noted.  Psychiatric: She has a normal mood and affect. Her behavior is normal.    ED Course  Procedures (including critical care time) Labs Review Labs Reviewed  COMPREHENSIVE METABOLIC PANEL - Abnormal; Notable for the following:    Glucose, Bld 133 (*)    GFR calc non Af Amer 89 (*)    All other components within normal limits  URINALYSIS, ROUTINE W REFLEX MICROSCOPIC - Abnormal; Notable for the following:    APPearance CLOUDY (*)    All other components within normal limits  HEMOGLOBIN A1C - Abnormal; Notable for the following:    Hgb A1c MFr Bld 7.0 (*)    All other components within normal limits  HEMOGLOBIN A1C - Abnormal; Notable for the following:  Hgb A1c MFr Bld 6.9 (*)    All other components within normal limits  LIPID PANEL - Abnormal; Notable for the following:    Triglycerides 170 (*)    All other components within normal limits  GLUCOSE, CAPILLARY - Abnormal; Notable for the following:    Glucose-Capillary 124 (*)    All other components within normal limits  GLUCOSE, CAPILLARY - Abnormal; Notable for the following:    Glucose-Capillary 109 (*)    All other components within normal limits  GLUCOSE, CAPILLARY - Abnormal; Notable for the following:    Glucose-Capillary 112 (*)    All other components within normal limits  GLUCOSE, CAPILLARY - Abnormal; Notable for the following:    Glucose-Capillary 122 (*)    All other components within normal limits  GLUCOSE, CAPILLARY - Abnormal; Notable for the following:    Glucose-Capillary 129 (*)    All other components within normal limits  I-STAT CHEM 8, ED - Abnormal; Notable for the following:    Glucose, Bld 132 (*)    Calcium, Ion 1.12 (*)    All other components within normal limits  CBG MONITORING, ED -  Abnormal; Notable for the following:    Glucose-Capillary 112 (*)    All other components within normal limits  ETHANOL  PROTIME-INR  APTT  CBC  DIFFERENTIAL  URINE RAPID DRUG SCREEN (HOSP PERFORMED)  PROTIME-INR  GLUCOSE, CAPILLARY  I-STAT TROPOININ, ED  I-STAT TROPOININ, ED    Imaging Review Ct Head Wo Contrast  03/30/2015   ADDENDUM REPORT: 03/30/2015 15:35  ADDENDUM: Critical Value/emergent results were called by telephone at the time of interpretation on 03/30/2015 at 3:35 pm to Dr. Aram Beecham, neurology , who verbally acknowledged these results.   Electronically Signed   By: Lowella Grip III M.D.   On: 03/30/2015 15:35   03/30/2015   CLINICAL DATA:  Acute onset right lower extremity weakness  EXAM: CT HEAD WITHOUT CONTRAST  TECHNIQUE: Contiguous axial images were obtained from the base of the skull through the vertex without intravenous contrast.  COMPARISON:  January 29, 2015  FINDINGS: There is mild diffuse atrophy. There is no intracranial mass hemorrhage, extra-axial fluid collection, or midline shift. There is evidence of a prior small infarct in the mid left cerebellum, stable. There is small vessel disease throughout the periventricular white matter bilaterally. There is a new area of decreased attenuation in the posterior right corona radiata which may represent an acute/recent small white matter infarct in this area. No other new gray-white compartment lesion identified. There is small vessel disease in the anterior limb of the left internal capsule, stable. There is also a prior small infarct in the right thalamus, stable. Bony calvarium appears intact. The mastoid air cells are clear. The middle cerebral arteries have symmetric attenuation and appear stable bilaterally.  IMPRESSION: Suspect new small white matter infarct in the right corona radiata. Extensive supratentorial/periventricular small vessel disease remains. Prior small infarct in the anterior limb of the left internal  capsule as well as in the right thalamus and left cerebellum remains stable. No hemorrhage or mass effect.  Electronically Signed: By: Lowella Grip III M.D. On: 03/30/2015 15:25   Ct Angio Neck W/cm &/or Wo/cm  03/31/2015   CLINICAL DATA:  75 year old female with small acute left frontal lobe infarcts diagnosed on MRI during evaluation of right lower extremity weakness. Initial encounter.  EXAM: CT ANGIOGRAPHY NECK  TECHNIQUE: Multidetector CT imaging of the neck was performed using the standard protocol during bolus  administration of intravenous contrast. Multiplanar CT image reconstructions and MIPs were obtained to evaluate the vascular anatomy. Carotid stenosis measurements (when applicable) are obtained utilizing NASCET criteria, using the distal internal carotid diameter as the denominator.  CONTRAST:  35mL OMNIPAQUE IOHEXOL 350 MG/ML SOLN  COMPARISON:  Brain MRI and intracranial MRA 03/30/2015, and earlier  FINDINGS: Aortic arch: The aortic arch is not entirely included on these images. There is a bovine arch configuration. There is moderate to severe arch atherosclerosis including mural plaque or thrombus up to 8 mm in the distal arch.  Right carotid system: No brachiocephalic or right CCA origin stenosis. Mildly tortuous right CCA with a partially retropharyngeal course. Soft and calcified plaque at the right carotid bifurcation but no right ICA origin stenosis. Tortuous right ICA with a partially retropharyngeal course, otherwise negative to the skullbase. Visible right ICA siphon is patent with calcified plaque.  Left carotid system: Calcified plaque at the left CCA origin resulting in up to 50 % stenosis with respect to the distal vessel (coronal image 144). Beyond its origin the left CCA is mildly tortuous. There is calcified plaque at the left carotid bifurcation and involving the posterior left ICA bulb but not resulting in left ICA origin stenosis. Tortuous left ICA just beyond the bulb with  a mildly kinked appearance. Otherwise negative to the skullbase. Visible left ICA siphon is patent with calcified plaque.  Vertebral arteries:  Calcified plaque at the right subclavian artery origin resulting in stenosis of less than 50 % with respect to the distal vessel . Calcified plaque at the right vertebral artery origin with mild stenosis. Otherwise negative right vertebral artery to the vertebrobasilar junction. Normal right PICA origin.  Calcified plaque in the left subclavian artery origin with stenosis up to 60 % with respect to the distal vessel. Mild calcified plaque at the left vertebral artery origin with mild stenosis. The proximal left vertebral artery is tortuous. The vertebral arteries are codominant. The left is otherwise negative to the vertebrobasilar junction. Dominant appearing left AICA.  Skeleton: Largely absent dentition. Degenerative changes in the cervical spine. Thoracic scoliosis. No acute osseous abnormality identified. Mild bubbly opacity in the right posterior ethmoid air cells. Other Visualized paranasal sinuses and mastoids are clear.  Other neck: Thyromegaly with bilateral less than 15 mm hypodense thyroid nodules. Larynx, pharynx, parapharyngeal spaces and sublingual space are within normal limits. Negative retropharyngeal space aside from a retropharyngeal course of the carotids, more so the right. Submandibular and parotid glands are within normal limits. Negative orbits soft tissues.  No cervical lymphadenopathy.  Dependent atelectasis in the left upper lobe. No superior mediastinal lymphadenopathy.  IMPRESSION: 1. 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 No other Hemodynamically significant stenosis of the carotid or vertebral arteries in the neck. 2. 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   Electronically Signed   By: Genevie Ann M.D.   On: 03/31/2015 07:21   Mr Jodene Nam Head Wo  Contrast  03/30/2015   CLINICAL DATA:  Right leg weakness.  EXAM: MRI HEAD WITHOUT CONTRAST  MRA HEAD WITHOUT CONTRAST  TECHNIQUE: Multiplanar, multiecho pulse sequences of the brain and surrounding structures were obtained without intravenous contrast. Angiographic images of the head were obtained using MRA technique without contrast.  COMPARISON:  Head CT 03/30/2015 and MRI 06/21/2005  FINDINGS: MRI HEAD FINDINGS  There are several punctate acute infarcts in  the high, medial left frontal lobe which are predominantly subcortical in location. There is no evidence of intracranial hemorrhage, mass, midline shift, or extra-axial fluid collection. There is mild generalized cerebral atrophy. Patchy and confluent T2 hyperintensities in the periventricular greater than subcortical cerebral white matter have increased from the prior MRI and are nonspecific but compatible with moderate chronic small vessel ischemic disease. Chronic lacunar infarcts are noted in the left corona radiata and right greater than left thalami. A small, chronic left cerebellar infarct is also noted.  Prior bilateral cataract extraction is noted. Paranasal sinuses mastoid air cells are clear. Major intracranial vascular flow voids are preserved.  MRA HEAD FINDINGS  Images are mildly to moderately motion degraded. Visualized distal vertebral arteries are patent and codominant without stenosis. Right PICA origin is patent. Left PICA is not identified. AICA and SCA origins are patent. Basilar artery is patent without stenosis. There is a fetal origin of the left PCA. There is moderate right P1 segment stenosis.  Internal carotid arteries are patent from skullbase to carotid termini without evidence of significant stenosis. M1 and A1 segments are patent without stenosis. A2 segments and MCA branch vessels are suboptimally evaluated due to motion artifact but appear patent without significant proximal stenosis identified. No intracranial aneurysm is  identified.  IMPRESSION: 1. Punctate acute left frontal lobe infarcts. 2. Moderate chronic small vessel ischemic disease and chronic lacunar infarcts as above. 3. No major intracranial arterial occlusion or significant proximal anterior circulation stenosis. Moderate proximal right PCA stenosis.   Electronically Signed   By: Logan Bores   On: 03/30/2015 16:52   Mr Brain Wo Contrast  03/30/2015   CLINICAL DATA:  Right leg weakness.  EXAM: MRI HEAD WITHOUT CONTRAST  MRA HEAD WITHOUT CONTRAST  TECHNIQUE: Multiplanar, multiecho pulse sequences of the brain and surrounding structures were obtained without intravenous contrast. Angiographic images of the head were obtained using MRA technique without contrast.  COMPARISON:  Head CT 03/30/2015 and MRI 06/21/2005  FINDINGS: MRI HEAD FINDINGS  There are several punctate acute infarcts in the high, medial left frontal lobe which are predominantly subcortical in location. There is no evidence of intracranial hemorrhage, mass, midline shift, or extra-axial fluid collection. There is mild generalized cerebral atrophy. Patchy and confluent T2 hyperintensities in the periventricular greater than subcortical cerebral white matter have increased from the prior MRI and are nonspecific but compatible with moderate chronic small vessel ischemic disease. Chronic lacunar infarcts are noted in the left corona radiata and right greater than left thalami. A small, chronic left cerebellar infarct is also noted.  Prior bilateral cataract extraction is noted. Paranasal sinuses mastoid air cells are clear. Major intracranial vascular flow voids are preserved.  MRA HEAD FINDINGS  Images are mildly to moderately motion degraded. Visualized distal vertebral arteries are patent and codominant without stenosis. Right PICA origin is patent. Left PICA is not identified. AICA and SCA origins are patent. Basilar artery is patent without stenosis. There is a fetal origin of the left PCA. There is  moderate right P1 segment stenosis.  Internal carotid arteries are patent from skullbase to carotid termini without evidence of significant stenosis. M1 and A1 segments are patent without stenosis. A2 segments and MCA branch vessels are suboptimally evaluated due to motion artifact but appear patent without significant proximal stenosis identified. No intracranial aneurysm is identified.  IMPRESSION: 1. Punctate acute left frontal lobe infarcts. 2. Moderate chronic small vessel ischemic disease and chronic lacunar infarcts as above. 3. No major intracranial  arterial occlusion or significant proximal anterior circulation stenosis. Moderate proximal right PCA stenosis.   Electronically Signed   By: Logan Bores   On: 03/30/2015 16:52   Ct Angio Chest Aorta W/cm &/or Wo/cm  03/31/2015   CLINICAL DATA:  Altered mental status, evaluate for aortic thrombus  EXAM: CT ANGIOGRAPHY CHEST WITH CONTRAST  TECHNIQUE: Multidetector CT imaging of the chest was performed using the standard protocol during bolus administration of intravenous contrast. Multiplanar CT image reconstructions and MIPs were obtained to evaluate the vascular anatomy.  CONTRAST:  161mL OMNIPAQUE IOHEXOL 300 MG/ML  SOLN  COMPARISON:  None.  FINDINGS: Unenhanced CT, there is no evidence of mediastinal hematoma.  No evidence of thoracic aortic aneurysm or dissection.  Atherosclerotic calcifications of the aortic arch/descending thoracic aorta. Eccentric mural thrombus along the proximal descending thoracic aorta (series 4/image 23) and along the distal descending thoracic aorta (series 4/ image 78).  Although not tailored for evaluation of the pulmonary arteries, there is no evidence of pulmonary embolism to the segmental level.  Mediastinum/Nodes: Heart is top-normal in size. No pericardial effusion.  No suspicious mediastinal, hilar, or axillary lymphadenopathy.  Visualized thyroid is enlarged/ nodular.  Lungs/Pleura: Mild dependent atelectasis in the  bilateral upper lobes.  No suspicious pulmonary nodules.  No focal consolidation.  No pleural effusion or pneumothorax.  Upper abdomen: Visualized upper abdomen is within normal limits.  Musculoskeletal: Mild thoracic dextroscoliosis.  Review of the MIP images confirms the above findings.  IMPRESSION: No overt cardioembolic source identified. 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  No evidence of thoracic aortic aneurysm or dissection.  No evidence of pulmonary embolism.   Electronically Signed   By: Julian Hy M.D.   On: 03/31/2015 21:35     EKG Interpretation None      MDM   Final diagnoses:  Cerebral infarction due to unspecified mechanism    Patient admitted by hospitalist. CTs shows suspect new white matter infarct right corona radiata. Left internal capsule prior small infarct. MRI pending.    Tanna Furry, MD 04/01/15 [phone]

## 2015-04-01 NOTE — Progress Notes (Signed)
STROKE TEAM PROGRESS NOTE   HISTORY Laura Melendez is an 74 y.o. female with HTN, DM, who was noted yesterday to be acting different than usual (more quite and not very active). This AM she was noted to have urinated in her bed. Her husband had her stand up and changed the sheets (LKW 03/30/2015 at 49). They went back to sleep. She awoke around 0900 and was noted to have weakness in her right leg more than usual. EMS was called and she was brought to ED. While in ED she continued to show only right leg weakness. 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 Denies HA, vertigo, double vision, difficulty swallowing, slurred speech, language or vision impairment. Patient was not administered TPA secondary to delay in arrival. She was admitted for further evaluation and treatment.   SUBJECTIVE (INTERVAL HISTORY) Patient's husband is at the bedside. Patient still has depressed mood and crying spells. Vascular surgery and cardio vessel surgery consulted. TEE today.   OBJECTIVE Temp:  [97.7 F (36.5 C)-99.7 F (37.6 C)] 98.5 F (36.9 C) (04/15 1446) Pulse Rate:  [53-81] 57 (04/15 1446) Cardiac Rhythm:  [-] Normal sinus rhythm (04/15 1215) Resp:  [13-20] 18 (04/15 1446) BP: (127-176)/(48-90) 151/52 mmHg (04/15 1446) SpO2:  [91 %-100 %] 96 % (04/15 1446)   Recent Labs Lab 03/31/15 1205 03/31/15 1705 03/31/15 2223 04/01/15 0549 04/01/15 1134  GLUCAP 112* 96 122* 129* 124*    Recent Labs Lab 03/30/15 1212 03/30/15 1217  NA 142 142  K 3.9 3.9  CL 105 105  CO2 26  --   GLUCOSE 133* 132*  BUN 13 15  CREATININE 0.58 0.60  CALCIUM 9.7  --     Recent Labs Lab 03/30/15 1212  AST 22  ALT 16  ALKPHOS 40  BILITOT 0.5  PROT 7.4  ALBUMIN 4.0    Recent Labs Lab 03/30/15 1212 03/30/15 1217  WBC 6.2  --   NEUTROABS 3.6  --   HGB 13.4 14.6  HCT 40.9 43.0  MCV 81.5  --   PLT 204  --    No results  for input(s): CKTOTAL, CKMB, CKMBINDEX, TROPONINI in the last 168 hours.  Recent Labs  03/30/15 1212 03/31/15 1224  LABPROT 13.6 14.0  INR 1.03 1.07    Recent Labs  03/30/15 1520  COLORURINE YELLOW  LABSPEC 1.015  PHURINE 8.0  GLUCOSEU NEGATIVE  HGBUR NEGATIVE  BILIRUBINUR NEGATIVE  KETONESUR NEGATIVE  PROTEINUR NEGATIVE  UROBILINOGEN 0.2  NITRITE NEGATIVE  LEUKOCYTESUR NEGATIVE       Component Value Date/Time   CHOL 166 03/31/2015 0625   CHOL 211* 02/21/2015 0810   TRIG 170* 03/31/2015 0625   HDL 41 03/31/2015 0625   HDL 52 02/21/2015 0810   CHOLHDL 4.0 03/31/2015 0625   CHOLHDL 4.1 02/21/2015 0810   VLDL 34 03/31/2015 0625   LDLCALC 91 03/31/2015 0625   LDLCALC 124* 02/21/2015 0810   Lab Results  Component Value Date   HGBA1C 6.9* 03/31/2015      Component Value Date/Time   LABOPIA NONE DETECTED 03/30/2015 1520   COCAINSCRNUR NONE DETECTED 03/30/2015 1520   LABBENZ NONE DETECTED 03/30/2015 1520   AMPHETMU NONE DETECTED 03/30/2015 1520   THCU NONE DETECTED 03/30/2015 1520   LABBARB NONE DETECTED 03/30/2015 1520     Recent Labs Lab 03/30/15 1212  ETH <5   I have personally reviewed the radiological images below and agree with  the radiology interpretations.  Ct Head Wo Contrast 03/30/2015   Suspect new small white matter infarct in the right corona radiata. Extensive supratentorial/periventricular small vessel disease remains. Prior small infarct in the anterior limb of the left internal capsule as well as in the right thalamus and left cerebellum remains stable. No hemorrhage or mass effect.    Ct Angio Neck W/cm &/or Wo/cm 03/31/2015    1. 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 No other Hemodynamically significant stenosis of the carotid or vertebral arteries in the neck. 2. 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     Mr Brain Wo Contrast 03/30/2015     1. Punctate acute left frontal lobe infarcts. 2. Moderate chronic small vessel ischemic disease and chronic lacunar infarcts.  Mr Jodene Nam Head Wo Contrast 03/30/2015   3. No major intracranial arterial occlusion or significant proximal anterior circulation stenosis. Moderate proximal right PCA stenosis.     2D echo  03/31/2015 Study Conclusions - Left ventricle: The cavity size was normal. Wall thickness was normal. Systolic function was normal. The estimated ejection fraction was in the range of 50% to 55%. Doppler parameters are consistent with abnormal left ventricular relaxation (grade 1 diastolic dysfunction). 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  TEE  04/01/2015 No cardiac source of embolism. 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  CTA chest  03/31/2015 No overt cardioembolic source identified.  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 No evidence of thoracic aortic aneurysm or dissection. No evidence of pulmonary embolism.   PHYSICAL EXAM  Temp:  [97.7 F (36.5 C)-99.7 F (37.6 C)] 98.5 F (36.9 C) (04/15 1446) Pulse Rate:  [53-81] 57 (04/15 1446) Resp:  [13-20] 18 (04/15 1446) BP: (127-176)/(48-90) 151/52 mmHg (04/15 1446) SpO2:  [91 %-100 %] 96 % (04/15 1446)  General - Well nourished, well developed, depressed mood, flat affect, crying spells.  Ophthalmologic - not cooperative on exam.  Cardiovascular - Regular rate and rhythm.  Mental Status -  Level of arousal and orientation to time, place, and person were intact. Language including expression, naming, repetition, comprehension was assessed and found intact. However, pt demonstrated psychomotor slowing.  Cranial Nerves II - XII - II - Visual field intact OU. III, IV, VI - Extraocular movements intact. V - Facial sensation intact  bilaterally. VII - Facial movement intact bilaterally. VIII - Hearing & vestibular intact bilaterally. X - Palate elevates symmetrically. XI - Chin turning & shoulder shrug intact bilaterally. XII - Tongue protrusion intact.  Motor Strength - The patient's strength was RUE 3/5 and RLE 2/5 and pronator drift was present on the right. LUE and LLE 4/5. Bulk was normal and fasciculations were absent.   Motor Tone - Muscle tone was assessed at the neck and appendages and was normal.  Reflexes - The patient's reflexes were 1+ in all extremities and she had no pathological reflexes.  Sensory - Light touch, temperature/pinprick were assessed and were symmetrical.    Coordination - The patient had normal movements in the right hand with no ataxia or dysmetria.  Tremor was absent.  Gait and Station - not tested due to weakness.   ASSESSMENT/PLAN Laura Melendez is a 74 y.o. female with history of HTN, DM presenting who woke  after urinating in the bed with right leg weakness. She did not receive IV t-PA due to delay in arrival.   Stroke:  Dominant left punctate ACA territory infarcts, etiology unclear, possibly embolic, in setting of recent admission for proximal descending aortic thrombus. Endocarditis ruled out by TEE, malignancy less likely due to severe atherosclerosis throughout big vessels and patient has no symptoms and signs of malignancy.  Resultant  Right hemiparesis, depression/sadness  MRI  puctate L ACA acute infarcts  MRA  No anterior circulation significant stenosis; mod R PCA stenosis  CTA neck no significant large vessel stenosis, however found to have possible descending aorta thrombus.  2D Echo unremarkable as above.  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  LDL 124 in 02/2015, not at goal  HgbA1c 6.9  Heparin 5000 units sq tid for VTE  prophylaxis Diet NPO time specified  aspirin 81 mg orally every day prior to admission, currently on Coumadin due to mural thrombus. Once INR 2-3, DC aspirin.    Ongoing aggressive stroke risk factor management  Therapy recommendations:  CIR planned.  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  On Coumadin bridged with aspirin. As per Dr. Bridgett Larsson, repeat CTA chest/abdomen/pelvis in 6 months  Hypertension  Home meds:   Lisinopril, lopressor  Stable  Permissive hypertension (OK if < 220/120) but gradually normalize in 5-7 days   Hyperlipidemia  Home meds:  Omega 3 fish oil, pravachol 20 (newly added since March by primary care), resumed in hospital  LDL 124, goal < 70  Continue statin at discharge  Diabetes  HgbA1c 6.9, at goal < 7.0  Controlled  Tobacco abuse  Current smoker  patient reports she quit smoking 4 weeks ago but husband said he had caught her sneaking cigarettes  Smoking cessation counseling provided  Pt is willing to quit  Other Stroke Risk Factors  Advanced age  Other Pertinent History  osteoporois  recent adm for toxic metabolic encephalopathy, ? Sepsis   Hospital day # 2  Mikey Bussing PA-C [facility] Pager [phone] 04/01/2015, 2:55 PM    I, the attending vascular neurologist, have personally obtained a history, examined the patient, evaluated laboratory data, individually viewed imaging studies and agree with radiology interpretations. I also obtained additional history from pt's husband and daughter at bedside. I also discussed with Dr. Candiss Norse regarding her care plan. Together with the NP/PA, we formulated the assessment and plan of care which reflects our mutual decision.   I have made any additions or clarifications directly to the above note and agree with the findings and plan as currently documented.   74 yo F with history of hypertension, diabetes, smoker presented with left ACA punctate infarcts. MRA head unremarkable, however CTA of the neck showed possible descending aortic thrombus. CTA chest to evaluate aorta showed mural thrombus in the descending aorta. TEEshowed no endocarditis but aortic atheroma. LDL 124, continue statin. Agree with anticoagulation with Coumadin bridged with aspirin.  Neurology will sign off. Please call with questions. Pt will follow up with Dr. Erlinda Hong at [facility] in about 2 months. Thanks for the consult.  Rosalin Hawking, MD PhD Stroke Neurology 04/01/2015  5:02 PM    To contact Stroke Continuity provider, please refer to [email or web address] After hours, contact General Neurology

## 2015-04-02 ENCOUNTER — Inpatient Hospital Stay (HOSPITAL_COMMUNITY)
Admission: RE | Admit: 2015-04-02 | Discharge: 2015-04-22 | DRG: 057 | Disposition: A | Discharge status: Home Health | Payer: Medicare HMO | Source: Intra-hospital | Attending: Physical Medicine & Rehabilitation | Admitting: Physical Medicine & Rehabilitation

## 2015-04-02 ENCOUNTER — Encounter (HOSPITAL_COMMUNITY): Payer: Self-pay | Admitting: *Deleted

## 2015-04-02 DIAGNOSIS — I63412 Cerebral infarction due to embolism of left middle cerebral artery: Secondary | ICD-10-CM | POA: Diagnosis present

## 2015-04-02 DIAGNOSIS — I69351 Hemiplegia and hemiparesis following cerebral infarction affecting right dominant side: Principal | ICD-10-CM

## 2015-04-02 DIAGNOSIS — I6349 Cerebral infarction due to embolism of other cerebral artery: Secondary | ICD-10-CM

## 2015-04-02 DIAGNOSIS — F329 Major depressive disorder, single episode, unspecified: Secondary | ICD-10-CM | POA: Diagnosis present

## 2015-04-02 DIAGNOSIS — I69391 Dysphagia following cerebral infarction: Secondary | ICD-10-CM | POA: Diagnosis not present

## 2015-04-02 DIAGNOSIS — M810 Age-related osteoporosis without current pathological fracture: Secondary | ICD-10-CM | POA: Diagnosis present

## 2015-04-02 DIAGNOSIS — R1311 Dysphagia, oral phase: Secondary | ICD-10-CM | POA: Diagnosis present

## 2015-04-02 DIAGNOSIS — E119 Type 2 diabetes mellitus without complications: Secondary | ICD-10-CM

## 2015-04-02 DIAGNOSIS — I69320 Aphasia following cerebral infarction: Secondary | ICD-10-CM | POA: Diagnosis not present

## 2015-04-02 DIAGNOSIS — Z87891 Personal history of nicotine dependence: Secondary | ICD-10-CM

## 2015-04-02 DIAGNOSIS — I10 Essential (primary) hypertension: Secondary | ICD-10-CM | POA: Diagnosis present

## 2015-04-02 DIAGNOSIS — I7410 Embolism and thrombosis of unspecified parts of aorta: Secondary | ICD-10-CM | POA: Diagnosis present

## 2015-04-02 DIAGNOSIS — IMO0002 Reserved for concepts with insufficient information to code with codable children: Secondary | ICD-10-CM | POA: Diagnosis present

## 2015-04-02 DIAGNOSIS — I69921 Dysphasia following unspecified cerebrovascular disease: Secondary | ICD-10-CM | POA: Diagnosis not present

## 2015-04-02 DIAGNOSIS — I7411 Embolism and thrombosis of thoracic aorta: Secondary | ICD-10-CM | POA: Diagnosis present

## 2015-04-02 DIAGNOSIS — K5900 Constipation, unspecified: Secondary | ICD-10-CM | POA: Diagnosis not present

## 2015-04-02 DIAGNOSIS — B370 Candidal stomatitis: Secondary | ICD-10-CM | POA: Diagnosis not present

## 2015-04-02 DIAGNOSIS — R32 Unspecified urinary incontinence: Secondary | ICD-10-CM | POA: Diagnosis present

## 2015-04-02 DIAGNOSIS — I69328 Other speech and language deficits following cerebral infarction: Secondary | ICD-10-CM | POA: Diagnosis not present

## 2015-04-02 DIAGNOSIS — R1314 Dysphagia, pharyngoesophageal phase: Secondary | ICD-10-CM | POA: Diagnosis not present

## 2015-04-02 DIAGNOSIS — E08311 Diabetes mellitus due to underlying condition with unspecified diabetic retinopathy with macular edema: Secondary | ICD-10-CM | POA: Diagnosis not present

## 2015-04-02 DIAGNOSIS — G8190 Hemiplegia, unspecified affecting unspecified side: Secondary | ICD-10-CM | POA: Diagnosis not present

## 2015-04-02 DIAGNOSIS — E785 Hyperlipidemia, unspecified: Secondary | ICD-10-CM | POA: Diagnosis present

## 2015-04-02 DIAGNOSIS — G8191 Hemiplegia, unspecified affecting right dominant side: Secondary | ICD-10-CM

## 2015-04-02 LAB — GLUCOSE, CAPILLARY
GLUCOSE-CAPILLARY: 100 mg/dL — AB (ref 70–99)
GLUCOSE-CAPILLARY: 114 mg/dL — AB (ref 70–99)
Glucose-Capillary: 111 mg/dL — ABNORMAL HIGH (ref 70–99)
Glucose-Capillary: 121 mg/dL — ABNORMAL HIGH (ref 70–99)

## 2015-04-02 LAB — CBC WITH DIFFERENTIAL/PLATELET
Basophils Absolute: 0.1 10*3/uL (ref 0.0–0.1)
Basophils Relative: 1 % (ref 0–1)
Eosinophils Absolute: 0.1 10*3/uL (ref 0.0–0.7)
Eosinophils Relative: 1 % (ref 0–5)
HEMATOCRIT: 40 % (ref 36.0–46.0)
HEMOGLOBIN: 13.3 g/dL (ref 12.0–15.0)
LYMPHS ABS: 2.4 10*3/uL (ref 0.7–4.0)
Lymphocytes Relative: 32 % (ref 12–46)
MCH: 26.8 pg (ref 26.0–34.0)
MCHC: 33.3 g/dL (ref 30.0–36.0)
MCV: 80.5 fL (ref 78.0–100.0)
MONO ABS: 0.5 10*3/uL (ref 0.1–1.0)
Monocytes Relative: 7 % (ref 3–12)
Neutro Abs: 4.6 10*3/uL (ref 1.7–7.7)
Neutrophils Relative %: 59 % (ref 43–77)
Platelets: 200 10*3/uL (ref 150–400)
RBC: 4.97 MIL/uL (ref 3.87–5.11)
RDW: 14.3 % (ref 11.5–15.5)
WBC: 7.7 10*3/uL (ref 4.0–10.5)

## 2015-04-02 LAB — COMPREHENSIVE METABOLIC PANEL
ALK PHOS: 38 U/L — AB (ref 39–117)
ALT: 21 U/L (ref 0–35)
AST: 24 U/L (ref 0–37)
Albumin: 3.5 g/dL (ref 3.5–5.2)
Anion gap: 14 (ref 5–15)
BILIRUBIN TOTAL: 0.5 mg/dL (ref 0.3–1.2)
BUN: 20 mg/dL (ref 6–23)
CALCIUM: 9.4 mg/dL (ref 8.4–10.5)
CO2: 21 mmol/L (ref 19–32)
Chloride: 103 mmol/L (ref 96–112)
Creatinine, Ser: 0.59 mg/dL (ref 0.50–1.10)
GFR calc Af Amer: >90 mL/min (ref >90)
GFR calc non Af Amer: 88 mL/min — ABNORMAL LOW (ref >90)
Glucose, Bld: 107 mg/dL — ABNORMAL HIGH (ref 70–99)
Potassium: 4.1 mmol/L (ref 3.5–5.1)
Sodium: 138 mmol/L (ref 135–145)
Total Protein: 6.9 g/dL (ref 6.0–8.3)

## 2015-04-02 LAB — PROTIME-INR
INR: 1.13 (ref 0.00–1.49)
PROTHROMBIN TIME: 14.6 s (ref 11.6–15.2)

## 2015-04-02 MED ORDER — ADULT MULTIVITAMIN W/MINERALS CH
1.0000 | ORAL_TABLET | Freq: Every day | ORAL | Status: DC
  Administered 2015-04-03 – 2015-04-22 (×18): 1 via ORAL
  Filled 2015-04-02 (×24): qty 1

## 2015-04-02 MED ORDER — FLEET ENEMA 7-19 GM/118ML RE ENEM: 1.0000 | ENEMA | Freq: Once | RECTAL | Status: AC | PRN

## 2015-04-02 MED ORDER — BISACODYL 10 MG RE SUPP
10.0000 mg | Freq: Every day | RECTAL | Status: DC | PRN
  Administered 2015-04-14: 10 mg via RECTAL
  Filled 2015-04-02: qty 1

## 2015-04-02 MED ORDER — PRAVASTATIN SODIUM 40 MG PO TABS
40.0000 mg | ORAL_TABLET | Freq: Every day | ORAL | Status: DC
  Administered 2015-04-02 – 2015-04-21 (×20): 40 mg via ORAL
  Filled 2015-04-02 (×22): qty 1

## 2015-04-02 MED ORDER — TRAZODONE HCL 50 MG PO TABS
25.0000 mg | ORAL_TABLET | Freq: Every evening | ORAL | Status: DC | PRN
Start: 2015-04-02 — End: 2015-04-22

## 2015-04-02 MED ORDER — SENNOSIDES-DOCUSATE SODIUM 8.6-50 MG PO TABS: 1.0000 | ORAL_TABLET | Freq: Every evening | ORAL | Status: DC | PRN

## 2015-04-02 MED ORDER — WARFARIN SODIUM 5 MG PO TABS: 5.0000 mg | ORAL_TABLET | Freq: Once | ORAL | Status: DC

## 2015-04-02 MED ORDER — ASPIRIN EC 325 MG PO TBEC
325.0000 mg | DELAYED_RELEASE_TABLET | Freq: Every day | ORAL | Status: DC
  Administered 2015-04-03: 325 mg via ORAL
  Filled 2015-04-02 (×3): qty 1

## 2015-04-02 MED ORDER — METOPROLOL TARTRATE 50 MG PO TABS
50.0000 mg | ORAL_TABLET | Freq: Two times a day (BID) | ORAL | Status: DC
  Administered 2015-04-02 – 2015-04-18 (×31): 50 mg via ORAL
  Filled 2015-04-02 (×35): qty 1

## 2015-04-02 MED ORDER — WARFARIN - PHARMACIST DOSING INPATIENT
Freq: Every day | Status: DC
Start: 2015-04-02 — End: 2015-04-22
  Administered 2015-04-03 – 2015-04-20 (×4)

## 2015-04-02 MED ORDER — METFORMIN HCL 500 MG PO TABS
500.0000 mg | ORAL_TABLET | Freq: Two times a day (BID) | ORAL | Status: DC
  Administered 2015-04-02 – 2015-04-03 (×2): 500 mg via ORAL
  Filled 2015-04-02 (×4): qty 1

## 2015-04-02 MED ORDER — PROCHLORPERAZINE EDISYLATE 5 MG/ML IJ SOLN: 5.0000 mg | Freq: Four times a day (QID) | INTRAMUSCULAR | Status: DC | PRN

## 2015-04-02 MED ORDER — ASPIRIN 325 MG PO TBEC: 325.0000 mg | DELAYED_RELEASE_TABLET | Freq: Every day | ORAL | Status: DC

## 2015-04-02 MED ORDER — INSULIN ASPART 100 UNIT/ML ~~LOC~~ SOLN: SUBCUTANEOUS | Status: DC

## 2015-04-02 MED ORDER — RALOXIFENE HCL 60 MG PO TABS
60.0000 mg | ORAL_TABLET | Freq: Every day | ORAL | Status: DC
  Administered 2015-04-03 – 2015-04-22 (×20): 60 mg via ORAL
  Filled 2015-04-02 (×24): qty 1

## 2015-04-02 MED ORDER — PRAVASTATIN SODIUM 20 MG PO TABS: 40.0000 mg | ORAL_TABLET | Freq: Every day | ORAL | Status: DC

## 2015-04-02 MED ORDER — GUAIFENESIN-DM 100-10 MG/5ML PO SYRP: 5.0000 mL | ORAL_SOLUTION | Freq: Four times a day (QID) | ORAL | Status: DC | PRN

## 2015-04-02 MED ORDER — INSULIN ASPART 100 UNIT/ML ~~LOC~~ SOLN
0.0000 [IU] | Freq: Three times a day (TID) | SUBCUTANEOUS | Status: DC
  Administered 2015-04-03 – 2015-04-05 (×3): 1 [IU] via SUBCUTANEOUS
  Administered 2015-04-06: 2 [IU] via SUBCUTANEOUS
  Administered 2015-04-08: 1 [IU] via SUBCUTANEOUS
  Administered 2015-04-09: 2 [IU] via SUBCUTANEOUS
  Administered 2015-04-09: 1 [IU] via SUBCUTANEOUS
  Administered 2015-04-10: 2 [IU] via SUBCUTANEOUS
  Administered 2015-04-10: 1 [IU] via SUBCUTANEOUS
  Administered 2015-04-11: 2 [IU] via SUBCUTANEOUS
  Administered 2015-04-11 – 2015-04-13 (×2): 1 [IU] via SUBCUTANEOUS
  Administered 2015-04-13: 2 [IU] via SUBCUTANEOUS
  Administered 2015-04-14: 1 [IU] via SUBCUTANEOUS
  Administered 2015-04-15: 2 [IU] via SUBCUTANEOUS
  Administered 2015-04-16: 1 [IU] via SUBCUTANEOUS
  Administered 2015-04-17: 2 [IU] via SUBCUTANEOUS
  Administered 2015-04-17 – 2015-04-21 (×6): 1 [IU] via SUBCUTANEOUS

## 2015-04-02 MED ORDER — SENNOSIDES-DOCUSATE SODIUM 8.6-50 MG PO TABS
1.0000 | ORAL_TABLET | Freq: Every evening | ORAL | Status: DC | PRN
  Administered 2015-04-14: 1 via ORAL
  Filled 2015-04-02: qty 1

## 2015-04-02 MED ORDER — ENOXAPARIN SODIUM 40 MG/0.4ML ~~LOC~~ SOLN
40.0000 mg | SUBCUTANEOUS | Status: DC
  Administered 2015-04-02 – 2015-04-03 (×2): 40 mg via SUBCUTANEOUS
  Filled 2015-04-02 (×3): qty 0.4

## 2015-04-02 MED ORDER — NIFEDIPINE ER 60 MG PO TB24
60.0000 mg | ORAL_TABLET | Freq: Every day | ORAL | Status: DC
  Administered 2015-04-04 – 2015-04-22 (×19): 60 mg via ORAL
  Filled 2015-04-02 (×25): qty 1

## 2015-04-02 MED ORDER — ALUM & MAG HYDROXIDE-SIMETH 200-200-20 MG/5ML PO SUSP: 30.0000 mL | ORAL | Status: DC | PRN

## 2015-04-02 MED ORDER — PROCHLORPERAZINE 25 MG RE SUPP: 12.5000 mg | Freq: Four times a day (QID) | RECTAL | Status: DC | PRN

## 2015-04-02 MED ORDER — LISINOPRIL 2.5 MG PO TABS
2.5000 mg | ORAL_TABLET | Freq: Every day | ORAL | Status: DC
  Administered 2015-04-03 – 2015-04-22 (×20): 2.5 mg via ORAL
  Filled 2015-04-02 (×24): qty 1

## 2015-04-02 MED ORDER — CALCIUM CARBONATE-VITAMIN D 500-200 MG-UNIT PO TABS
1.0000 | ORAL_TABLET | Freq: Two times a day (BID) | ORAL | Status: DC
  Administered 2015-04-03 – 2015-04-22 (×34): 1 via ORAL
  Filled 2015-04-02 (×44): qty 1

## 2015-04-02 MED ORDER — OMEGA-3-ACID ETHYL ESTERS 1 G PO CAPS
1.0000 g | ORAL_CAPSULE | Freq: Two times a day (BID) | ORAL | Status: DC
  Administered 2015-04-02 – 2015-04-12 (×13): 1 g via ORAL
  Filled 2015-04-02 (×25): qty 1

## 2015-04-02 MED ORDER — WARFARIN SODIUM 5 MG PO TABS: 5.0000 mg | ORAL_TABLET | Freq: Every day | ORAL | Status: DC

## 2015-04-02 MED ORDER — FLUOXETINE HCL 10 MG PO CAPS
10.0000 mg | ORAL_CAPSULE | Freq: Every day | ORAL | Status: DC
  Administered 2015-04-03 – 2015-04-22 (×20): 10 mg via ORAL
  Filled 2015-04-02 (×24): qty 1

## 2015-04-02 MED ORDER — WARFARIN SODIUM 5 MG PO TABS
5.0000 mg | ORAL_TABLET | Freq: Once | ORAL | Status: AC
  Administered 2015-04-02: 5 mg via ORAL
  Filled 2015-04-02: qty 1

## 2015-04-02 MED ORDER — ACETAMINOPHEN 325 MG PO TABS
325.0000 mg | ORAL_TABLET | ORAL | Status: DC | PRN
  Administered 2015-04-14: 650 mg via ORAL
  Filled 2015-04-02: qty 2

## 2015-04-02 MED ORDER — PROCHLORPERAZINE MALEATE 5 MG PO TABS
5.0000 mg | ORAL_TABLET | Freq: Four times a day (QID) | ORAL | Status: DC | PRN
  Filled 2015-04-02: qty 2

## 2015-04-02 NOTE — H&P (Signed)
Expand All Collapse All      Physical Medicine and Rehabilitation Admission H&P   Chief Complaint  Patient presents with  . Right sided weakness, slurred speech,    HPI: Laura Melendez is a 74 y.o. RH-female with history of DM type 2, HTN, DDD lumbar spine, recent febrile illness with negative workup; who was admitted on 03/30/15 with MS changes, incontinence and weakness RLE. 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 MRI brain done revealing subcortical punctate acute infarcts in the high, medial left frontal lobe and MRA without major intracranial occlusion. 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 CTA chest recommended for work up per Dr. Bridgett Larsson and anticoagulation if positive for thrombus. CTA chest revealed eccentric mural thrombus along proximal and descending aorta and patient was cleared to initiate coumadin for likely embolic stroke. TEE done revealing EF 55-60% with grade 1 diastolic dysfunction, severe atheromatous plaque in aortic arch with extensive and deep ulceration but no thrombus. Patient with resultant right hemiparesis, decreased vocal intensity with decreased awareness of deficits and delayed processing. CIR recommended by MD and Rehab team and patient cleared for admission today.    Review of Systems  Unable to perform ROS: other  Respiratory: Negative for shortness of breath.  Cardiovascular: Negative for chest pain.  Genitourinary:   Gets up 3-4 times at night.  Musculoskeletal: Positive for joint pain (Pain with limited ROM left shoulder ).  Neurological: Positive for speech change and focal weakness.  Psychiatric/Behavioral: Positive for depression. The patient is nervous/anxious.      Past Medical History  Diagnosis Date  . Essential hypertension, benign   . Type 2 diabetes mellitus   . Osteoporosis 06/2008  . Stroke     Past  Surgical History  Procedure Laterality Date  . Cholecystectomy    . Ankle surgery Left 2004    otif  . Abdominal hysterectomy    . Anterior and posterior repair N/A 03/11/2013    Procedure: ANTERIOR (CYSTOCELE) AND POSTERIOR REPAIR (RECTOCELE); Surgeon: Linda Hedges, DO; Location: [facility]; Service: Gynecology; Laterality: N/A; with sacrospinous ligament fixation bilateral  . Colonoscopy  2009    negative  . Cataract extraction w/phaco Right 12/27/2014    Procedure: CATARACT EXTRACTION PHACO AND INTRAOCULAR LENS PLACEMENT RIGHT EYE; Surgeon: Tonny Branch, MD; Location: [facility]; Service: Ophthalmology; Laterality: Right; CDE:10.63  . Cataract extraction w/phaco Left 01/13/2015    Procedure: CATARACT EXTRACTION PHACO AND INTRAOCULAR LENS PLACEMENT LEFT EYE; Surgeon: Tonny Branch, MD; Location: [facility]; Service: Ophthalmology; Laterality: Left; CDE:18.87    Family History  Problem Relation Age of Onset  . Cancer Father     Liver  . Cancer Sister     Breast  . Diabetes Brother     Social History: Married--husband retired and supportive. Independent PTA without AD. 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 She reports that she quit smoking about 4 weeks ago. Her smoking use included Cigarettes. She has a 7.5 pack-year smoking history. She does not have any smokeless tobacco history on file. She reports that she does not drink alcohol or use illicit drugs.    Allergies: No Known Allergies    Medications Prior to Admission  Medication Sig Dispense Refill  . aspirin EC 81 MG tablet Take 81 mg by mouth daily.    . Calcium Carb-Cholecalciferol (CALCIUM 600/VITAMIN D3) 600-800 MG-UNIT TABS Take 1 tablet by mouth 2 (two)  times daily with a meal.    . CINNAMON PO Take 1,000 mg by mouth daily.    Marland Kitchen CRANBERRY PO Take 84 mg by mouth daily.    Marland Kitchen lisinopril (PRINIVIL,ZESTRIL) 2.5  MG tablet Take 1 tablet (2.5 mg total) by mouth daily. 90 tablet 3  . metFORMIN (GLUCOPHAGE) 500 MG tablet Take 1 tablet (500 mg total) by mouth 2 (two) times daily with a meal. 180 tablet 3  . metoprolol (LOPRESSOR) 50 MG tablet Take 1 tablet (50 mg total) by mouth 2 (two) times daily. 180 tablet 3  . Multiple Vitamin (MULTIVITAMIN WITH MINERALS) TABS Take 1 tablet by mouth daily.    . naproxen sodium (ANAPROX) 220 MG tablet Take 220 mg by mouth daily as needed (pain).    Marland Kitchen NIFEdipine (PROCARDIA XL/ADALAT-CC) 60 MG 24 hr tablet Take 1 tablet (60 mg total) by mouth daily. 90 tablet 3  . Omega-3 Fatty Acids (FISH OIL CONCENTRATE) 300 MG CAPS Take 1 capsule by mouth daily.    . pravastatin (PRAVACHOL) 20 MG tablet Take 1 tablet (20 mg total) by mouth daily. (Patient taking differently: Take 20 mg by mouth at bedtime. ) 30 tablet 2  . raloxifene (EVISTA) 60 MG tablet Take 1 tablet (60 mg total) by mouth daily. 90 tablet 3  . alendronate (FOSAMAX) 70 MG tablet Take 1 tablet (70 mg total) by mouth every 7 (seven) days. 12 tablet 3    Home: Home Living Family/patient expects to be discharged to:: Private residence Living Arrangements: Spouse/significant other Available Help at Discharge: Family Type of Home: House Home Access: Stairs to enter Technical brewer of Steps: 5 Entrance Stairs-Rails: Left, Right Home Layout: One level Home Equipment: Environmental consultant - 2 wheels, Grab bars - toilet, Grab bars - tub/shower Lives With: Spouse  Functional History: Prior Function Level of Independence: Independent Comments: Still drives, Independent  Functional Status:  Mobility: Bed Mobility Overal bed mobility: Needs Assistance, +2 for physical assistance Bed Mobility: Sidelying to Sit, Rolling Rolling: Mod assist, +2 for physical assistance Sidelying to sit: Max assist, +2 for physical assistance, HOB elevated Supine to sit: Max assist, HOB  elevated Sit to supine: Max assist General bed mobility comments: Max assist for sequencing and tactile cues for hand placement. Pt requires assist using bed pad to roll to R side and max assist w/ BLEs and trunk to sit upright EOB. Transfers Overall transfer level: Needs assistance Equipment used: None Transfers: Sit to/from Stand, Stand Pivot Transfers Sit to Stand: Max assist, +2 safety/equipment Stand pivot transfers: Max assist, +2 safety/equipment Squat pivot transfers: Mod assist General transfer comment: Max assist w/ R knee block for sit>stand and stand pivot. Pt leaning posteriorly sitting EOB and standing. No evidence of RLE muscle recruitment thorughout session but shuffles LLE during stand pivot. Max cues for hand placement for safety. Ambulation/Gait General Gait Details: Pivotal shuffle w/ max assist only    ADL: ADL Overall ADL's : Needs assistance/impaired Eating/Feeding: Sitting, Bed level Grooming: Wash/dry hands, Wash/dry face, Oral care, Brushing hair, Minimal assistance, Sitting Upper Body Bathing: Moderate assistance, Sitting Lower Body Bathing: Maximal assistance, Sit to/from stand Upper Body Dressing : Maximal assistance, Sitting Lower Body Dressing: Total assistance, Sit to/from stand Toilet Transfer: Moderate assistance, Stand-pivot, BSC Toileting- Clothing Manipulation and Hygiene: Total assistance, Sit to/from stand Functional mobility during ADLs: Moderate assistance General ADL Comments: Pt incontinent of urine. Assisted her with peri care  Cognition: Cognition Overall Cognitive Status: Impaired/Different from baseline Arousal/Alertness: Awake/alert Orientation Level: Oriented to  person, Oriented to place, Oriented to time, Disoriented to situation Attention: Sustained Sustained Attention: Appears intact Awareness: Impaired Awareness Impairment: Intellectual impairment, Emergent impairment, Anticipatory impairment Problem Solving: (intact for  basic) Safety/Judgment: (to be further determined) Cognition Arousal/Alertness: Lethargic Behavior During Therapy: Flat affect Overall Cognitive Status: Impaired/Different from baseline Area of Impairment: Attention, Following commands, Problem solving, Awareness, Safety/judgement Current Attention Level: Sustained Following Commands: Follows one step commands with increased time, Follows one step commands inconsistently Safety/Judgement: Decreased awareness of safety, Decreased awareness of deficits Awareness: Intellectual Problem Solving: Slow processing, Decreased initiation, Difficulty sequencing, Requires verbal cues, Requires tactile cues General Comments: Pt requires increased time to initiate movement and does not follow all commands    Blood pressure 151/52, pulse 57, temperature 98.5 F (36.9 C), temperature source Oral, resp. rate 18, height 5\' 1"  (1.549 m), weight 45.36 kg (100 lb), SpO2 96 %. Physical Exam  Nursing note and vitals reviewed. Constitutional: She appears alert, no distress. HENT: oral mucosa moist/pink Head: Normocephalic and atraumatic.  Eyes: Conjunctivae are normal. Pupils are equal, round, and reactive to light.  Neck: Normal range of motion. Neck supple.  Cardiovascular: Normal rate and regular rhythm.  Respiratory: Effort normal and breath sounds normal. No respiratory distress. She has no wheezes.  GI: Soft. Bowel sounds are normal.  Musculoskeletal: She exhibits no edema or tenderness.  Neurological: She is alert. Aphasic/expressive. RUE: trace movement bicep, wrist in flexor synergy patter. RUE in extensor pattern--no volitional movement seen in leg. DTR's 3+. Right central 7 and tongue deviation. Slow to initiate movements of tongue. No withdraw to pain RUE and RLE.  M/S: left shoulder tender with active and PROM.  Psych: flat Skin: Skin is warm and dry.      Lab Results Last 48 Hours    Results for orders placed or performed during the  hospital encounter of 03/30/15 (from the past 48 hour(s))  CBG monitoring, ED Status: Abnormal   Collection Time: 03/30/15 4:46 PM  Result Value Ref Range   Glucose-Capillary 112 (H) 70 - 99 mg/dL  Hemoglobin A1c Status: Abnormal   Collection Time: 03/30/15 6:05 PM  Result Value Ref Range   Hgb A1c MFr Bld 7.0 (H) 4.8 - 5.6 %    Comment: (NOTE)  Pre-diabetes: 5.7 - 6.4  Diabetes: >6.4  Glycemic control for adults with diabetes: <7.0    Mean Plasma Glucose 154 mg/dL    Comment: (NOTE) Performed At: [facility] [place], [place] [id number] Lindon Romp MD VQ:[id number]   Glucose, capillary Status: Abnormal   Collection Time: 03/30/15 9:33 PM  Result Value Ref Range   Glucose-Capillary 124 (H) 70 - 99 mg/dL  Hemoglobin A1c Status: Abnormal   Collection Time: 03/31/15 6:25 AM  Result Value Ref Range   Hgb A1c MFr Bld 6.9 (H) 4.8 - 5.6 %    Comment: (NOTE)  Pre-diabetes: 5.7 - 6.4  Diabetes: >6.4  Glycemic control for adults with diabetes: <7.0    Mean Plasma Glucose 151 mg/dL    Comment: (NOTE) Performed At: [facility] [place], [place] [id number] Lindon Romp MD YK:[id number]   Lipid panel Status: Abnormal   Collection Time: 03/31/15 6:25 AM  Result Value Ref Range   Cholesterol 166 0 - 200 mg/dL   Triglycerides 170 (H) <150 mg/dL   HDL 41 >39 mg/dL   Total CHOL/HDL Ratio 4.0 RATIO   VLDL 34 0 - 40 mg/dL   LDL Cholesterol 91 0 - 99 mg/dL  Comment:   Total Cholesterol/HDL:CHD Risk Coronary Heart Disease Risk Table  Men Women 1/2 Average Risk 3.4 3.3 Average Risk 5.0 4.4 2 X Average Risk 9.6 7.1 3 X Average Risk 23.4 11.0   Use the calculated Patient Ratio above and the CHD Risk Table to  determine the patient's CHD Risk.   ATP III CLASSIFICATION (LDL): <100 mg/dL Optimal 100-129 mg/dL Near or Above  Optimal 130-159 mg/dL Borderline 160-189 mg/dL High >190 mg/dL Very High   Glucose, capillary Status: Abnormal   Collection Time: 03/31/15 7:36 AM  Result Value Ref Range   Glucose-Capillary 109 (H) 70 - 99 mg/dL  Glucose, capillary Status: Abnormal   Collection Time: 03/31/15 12:05 PM  Result Value Ref Range   Glucose-Capillary 112 (H) 70 - 99 mg/dL  Protime-INR Status: None   Collection Time: 03/31/15 12:24 PM  Result Value Ref Range   Prothrombin Time 14.0 11.6 - 15.2 seconds   INR 1.07 0.00 - 1.49  Glucose, capillary Status: None   Collection Time: 03/31/15 5:05 PM  Result Value Ref Range   Glucose-Capillary 96 70 - 99 mg/dL  Glucose, capillary Status: Abnormal   Collection Time: 03/31/15 10:23 PM  Result Value Ref Range   Glucose-Capillary 122 (H) 70 - 99 mg/dL   Comment 1 Notify RN    Comment 2 Document in Chart   Glucose, capillary Status: Abnormal   Collection Time: 04/01/15 5:49 AM  Result Value Ref Range   Glucose-Capillary 129 (H) 70 - 99 mg/dL   Comment 1 Notify RN   Glucose, capillary Status: Abnormal   Collection Time: 04/01/15 11:34 AM  Result Value Ref Range   Glucose-Capillary 124 (H) 70 - 99 mg/dL  Glucose, capillary Status: None   Collection Time: 04/01/15 4:25 PM  Result Value Ref Range   Glucose-Capillary 93 70 - 99 mg/dL       Ct Angio Neck W/cm &/or Wo/cm  03/31/2015 CLINICAL DATA: 74 year old female with small acute left frontal lobe infarcts diagnosed on MRI during evaluation of right lower extremity weakness. Initial encounter. EXAM: CT ANGIOGRAPHY NECK TECHNIQUE: Multidetector CT imaging of the neck was performed using the standard protocol  during bolus administration of intravenous contrast. Multiplanar CT image reconstructions and MIPs were obtained to evaluate the vascular anatomy. Carotid stenosis measurements (when applicable) are obtained utilizing NASCET criteria, using the distal internal carotid diameter as the denominator. CONTRAST: 89mL OMNIPAQUE IOHEXOL 350 MG/ML SOLN COMPARISON: Brain MRI and intracranial MRA 03/30/2015, and earlier FINDINGS: Aortic arch: The aortic arch is not entirely included on these images. There is a bovine arch configuration. There is moderate to severe arch atherosclerosis including mural plaque or thrombus up to 8 mm in the distal arch. Right carotid system: No brachiocephalic or right CCA origin stenosis. Mildly tortuous right CCA with a partially retropharyngeal course. Soft and calcified plaque at the right carotid bifurcation but no right ICA origin stenosis. Tortuous right ICA with a partially retropharyngeal course, otherwise negative to the skullbase. Visible right ICA siphon is patent with calcified plaque. Left carotid system: Calcified plaque at the left CCA origin resulting in up to 50 % stenosis with respect to the distal vessel (coronal image 144). Beyond its origin the left CCA is mildly tortuous. There is calcified plaque at the left carotid bifurcation and involving the posterior left ICA bulb but not resulting in left ICA origin stenosis. Tortuous left ICA just beyond the bulb with a mildly kinked appearance. Otherwise negative to the skullbase. Visible left ICA siphon  is patent with calcified plaque. Vertebral arteries: Calcified plaque at the right subclavian artery origin resulting in stenosis of less than 50 % with respect to the distal vessel . Calcified plaque at the right vertebral artery origin with mild stenosis. Otherwise negative right vertebral artery to the vertebrobasilar junction. Normal right PICA origin. Calcified plaque in the left subclavian artery origin with stenosis  up to 60 % with respect to the distal vessel. Mild calcified plaque at the left vertebral artery origin with mild stenosis. The proximal left vertebral artery is tortuous. The vertebral arteries are codominant. The left is otherwise negative to the vertebrobasilar junction. Dominant appearing left AICA. Skeleton: Largely absent dentition. Degenerative changes in the cervical spine. Thoracic scoliosis. No acute osseous abnormality identified. Mild bubbly opacity in the right posterior ethmoid air cells. Other Visualized paranasal sinuses and mastoids are clear. Other neck: Thyromegaly with bilateral less than 15 mm hypodense thyroid nodules. Larynx, pharynx, parapharyngeal spaces and sublingual space are within normal limits. Negative retropharyngeal space aside from a retropharyngeal course of the carotids, more so the right. Submandibular and parotid glands are within normal limits. Negative orbits soft tissues. No cervical lymphadenopathy. Dependent atelectasis in the left upper lobe. No superior mediastinal lymphadenopathy. IMPRESSION: 1. 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 No other Hemodynamically significant stenosis of the carotid or vertebral arteries in the neck. 2. 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 Electronically Signed By: Genevie Ann M.D. On: 03/31/2015 07:21    Mr Brain Wo Contrast  03/30/2015 CLINICAL DATA: Right leg weakness. EXAM: MRI HEAD WITHOUT CONTRAST MRA HEAD WITHOUT CONTRAST TECHNIQUE: Multiplanar, multiecho pulse sequences of the brain and surrounding structures were obtained without intravenous contrast. Angiographic images of the head were obtained using MRA technique without contrast. COMPARISON: Head CT 03/30/2015 and MRI 06/21/2005 FINDINGS: MRI HEAD FINDINGS There are several punctate acute infarcts in the high, medial left frontal lobe which are  predominantly subcortical in location. There is no evidence of intracranial hemorrhage, mass, midline shift, or extra-axial fluid collection. There is mild generalized cerebral atrophy. Patchy and confluent T2 hyperintensities in the periventricular greater than subcortical cerebral white matter have increased from the prior MRI and are nonspecific but compatible with moderate chronic small vessel ischemic disease. Chronic lacunar infarcts are noted in the left corona radiata and right greater than left thalami. A small, chronic left cerebellar infarct is also noted. Prior bilateral cataract extraction is noted. Paranasal sinuses mastoid air cells are clear. Major intracranial vascular flow voids are preserved. MRA HEAD FINDINGS Images are mildly to moderately motion degraded. Visualized distal vertebral arteries are patent and codominant without stenosis. Right PICA origin is patent. Left PICA is not identified. AICA and SCA origins are patent. Basilar artery is patent without stenosis. There is a fetal origin of the left PCA. There is moderate right P1 segment stenosis. Internal carotid arteries are patent from skullbase to carotid termini without evidence of significant stenosis. M1 and A1 segments are patent without stenosis. A2 segments and MCA branch vessels are suboptimally evaluated due to motion artifact but appear patent without significant proximal stenosis identified. No intracranial aneurysm is identified. IMPRESSION: 1. Punctate acute left frontal lobe infarcts. 2. Moderate chronic small vessel ischemic disease and chronic lacunar infarcts as above. 3. No major intracranial arterial occlusion or significant proximal anterior circulation stenosis. Moderate proximal right PCA stenosis. Electronically Signed By: Logan Bores  On: 03/30/2015 16:52    Ct Angio Chest Aorta W/cm &/or Wo/cm  03/31/2015 CLINICAL DATA: Altered mental status, evaluate for aortic thrombus EXAM: CT ANGIOGRAPHY  CHEST WITH CONTRAST TECHNIQUE: Multidetector CT imaging of the chest was performed using the standard protocol during bolus administration of intravenous contrast. Multiplanar CT image reconstructions and MIPs were obtained to evaluate the vascular anatomy. CONTRAST: 158mL OMNIPAQUE IOHEXOL 300 MG/ML SOLN COMPARISON: None. FINDINGS: Unenhanced CT, there is no evidence of mediastinal hematoma. No evidence of thoracic aortic aneurysm or dissection. Atherosclerotic calcifications of the aortic arch/descending thoracic aorta. Eccentric mural thrombus along the proximal descending thoracic aorta (series 4/image 23) and along the distal descending thoracic aorta (series 4/ image 78). Although not tailored for evaluation of the pulmonary arteries, there is no evidence of pulmonary embolism to the segmental level. Mediastinum/Nodes: Heart is top-normal in size. No pericardial effusion. No suspicious mediastinal, hilar, or axillary lymphadenopathy. Visualized thyroid is enlarged/ nodular. Lungs/Pleura: Mild dependent atelectasis in the bilateral upper lobes. No suspicious pulmonary nodules. No focal consolidation. No pleural effusion or pneumothorax. Upper abdomen: Visualized upper abdomen is within normal limits. Musculoskeletal: Mild thoracic dextroscoliosis. Review of the MIP images confirms the above findings. IMPRESSION: No overt cardioembolic source identified. 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 No evidence of thoracic aortic aneurysm or dissection. No evidence of pulmonary embolism. Electronically Signed By: Julian Hy M.D. On: 03/31/2015 21:35       Medical Problem List and Plan: 1. 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 Thoracic aortic and supraceliac aortic thrombus/Anticoagulation: Pharmaceutical: Coumadin. To follow up with  Dr. Bridgett Larsson in 6 month for repeat CTA chest/abd/pelvis.  3. Pain Management: Tylenol prn 4. Mood: Will start antidepressant to help with mood. Team to provide ego support. LCSW to follow for evaluation and support.  5. Neuropsych: This patient is capable of making decisions on his own behalf. 6. Skin/Wound Care: Routine pressure relief measures. Maintain adequate nutrition and hydration status.  7. Fluids/Electrolytes/Nutrition: Monitor I/O. Check lytes in am and serially. Offer supplement prn po intake.  8. HTN: Monitor BP every 8 hours. Continue Lopressor bid, and procardia XL. Avoid hypotension.  9. Dyslipidemia: On lovaza and Pravachol.  10 DM type 2: Hgb A1c- 6.9. Was on metformin at home. Will resume as 48 hours post procedure. Monitor BS ac/hs and use SSI for elevated BS.  11. Osteoporosis: Continue Evista. Resume fosamax at discharge.      Post Admission Physician Evaluation: 1. Functional deficits secondary to embolic Left frontal infarct.  2. Patient is admitted to receive collaborative, interdisciplinary care between the physiatrist, rehab nursing staff, and therapy team. 3. Patient's level of medical complexity and substantial therapy needs in context of that medical necessity cannot be provided at a lesser intensity of care such as a SNF. 4. Patient has experienced substantial functional loss from his/her baseline which was documented above under the "Functional History" and "Functional Status" headings. Judging by the patient's diagnosis, physical exam, and functional history, the patient has potential for functional progress which will result in measurable gains while on inpatient rehab. These gains will be of substantial and practical use upon discharge in facilitating mobility and self-care at the household level. 5. Physiatrist will provide 24 hour management of medical needs as well as oversight of the therapy plan/treatment and provide guidance as appropriate  regarding the interaction of the two. 6. 24 hour  rehab nursing will assist with bladder management, bowel management, safety, skin/wound care, disease management, medication administration, pain management and patient education and help integrate therapy concepts, techniques,education, etc. 7. PT will assess and treat for/with: Lower extremity strength, range of motion, stamina, balance, functional mobility, safety, adaptive techniques and equipment, NMR, cognitive perceptual rx, stroke education, family education. Goals are: min to mod assist. 8. OT will assess and treat for/with: ADL's, functional mobility, safety, upper extremity strength, adaptive techniques and equipment, NMR, cognitive perceptual rx, family ed, ego support. Goals are: min to max assist. Therapy may not yet proceed with showering this patient. 9. SLP will assess and treat for/with: language, swallowing, cognition, communication. Goals are: mod assist. 10. Case Management and Social Worker will assess and treat for psychological issues and discharge planning. 11. Team conference will be held weekly to assess progress toward goals and to determine barriers to discharge. 12. Patient will receive at least 3 hours of therapy per day at least 5 days per week. 13. ELOS: 20-30 dayus  14. Prognosis: good     Meredith Staggers, MD, [facility] 04/02/2015

## 2015-04-02 NOTE — Progress Notes (Signed)
ANTICOAGULATION CONSULT NOTE - Follow-up  Pharmacy Consult for warfarin Indication: aortic clot  No Known Allergies  Patient Measurements: Height: 5\' 1"  (154.9 cm) Weight: 100 lb (45.36 kg) IBW/kg (Calculated) : 47.8 Heparin Dosing Weight:   Vital Signs: Temp: 98 F (36.7 C) (04/16 0515) Temp Source: Oral (04/16 0515) BP: 145/63 mmHg (04/16 0515) Pulse Rate: 71 (04/16 0515)  Labs:  Recent Labs  03/30/15 1212 03/30/15 1217 03/31/15 1224 04/02/15 0649  HGB 13.4 14.6  --   --   HCT 40.9 43.0  --   --   PLT 204  --   --   --   APTT 30  --   --   --   LABPROT 13.6  --  14.0 14.6  INR 1.03  --  1.07 1.13  CREATININE 0.58 0.60  --   --     Estimated Creatinine Clearance: 44.2 mL/min (by C-G formula based on Cr of 0.6).  Assessment: 38 yof presented to the hospital with extremity weakness. Found to have multiple L frontal lobe infarcts. Also likely an aortic clot. She continues on warfarin for anticoagulation. INR is subtherapeutic as expected after only one dose of warfarin. She is also on full dose aspirin and heparin SQ. These will be discontinued when INR is therapeutic. Pt and husband were educated today.   Goal of Therapy:  INR 2-3 Monitor platelets by anticoagulation protocol: Yes   Plan:  - Repeat Warfarin 5mg  PO x 1 tonight - Daily INR - DC heparin SQ and aspirin when INR is therapeutic  Sharayah Renfrow, Rande Lawman 04/02/2015,9:33 AM

## 2015-04-02 NOTE — Discharge Instructions (Signed)

## 2015-04-02 NOTE — Discharge Summary (Signed)
PATIENT DETAILS Name: Laura Melendez Age: 74 y.o. Sex: female Date of Birth: [birth year] MRN: [id number]. Admitting Physician: Caren Griffins, MD XBM:[id number],MWNUU, MD  Admit Date: 03/30/2015 Discharge date: 04/02/2015  Recommendations for Outpatient Follow-up:  1. Please stop Aspirin once INR is >2 and just maintain on coumadin. 2. New Medication:coumadin for mural aortic thrombus 3. Recheck lipid panel in 3-6 months.    PRIMARY DISCHARGE DIAGNOSIS:  Principal Problem:   CVA (cerebral infarction) Active Problems:   Essential hypertension, benign   DM (diabetes mellitus)   Dyslipidemia   Tobacco abuse   Aortic thrombus   Stroke with cerebral ischemia   Cerebral infarction due to unspecified mechanism      PAST MEDICAL HISTORY: Past Medical History  Diagnosis Date  . Essential hypertension, benign   . Type 2 diabetes mellitus   . Osteoporosis 06/2008  . Stroke     DISCHARGE MEDICATIONS: Current Discharge Medication List    START taking these medications   Details  insulin aspart (NOVOLOG) 100 UNIT/ML injection PrescribeDon't Prescribe    insulin aspart (novoLOG) injection 0-9 Units 0-9 Units, Subcutaneous, 3 times daily with meals CBG < 70: implement hypoglycemia protocol CBG 70 - 120: 0 units CBG 121 - 150: 1 unit CBG 151 - 200: 2 units CBG 201 - 250: 3 units CBG 251 - 300: 5 units CBG 301 - 350: 7 units CBG 351 - 400: 9 units CBG > 400: call MD Qty: 10 mL, Refills: 11    warfarin (COUMADIN) 5 MG tablet Take 1 tablet (5 mg total) by mouth daily.      CONTINUE these medications which have CHANGED   Details  aspirin EC 325 MG EC tablet Take 1 tablet (325 mg total) by mouth daily. STOP ONCE INR IS MORE THAN 2 Qty: 30 tablet, Refills: 0    pravastatin (PRAVACHOL) 20 MG tablet Take 2 tablets (40 mg total) by mouth daily. Qty: 30 tablet, Refills: 2      CONTINUE these medications which have NOT CHANGED   Details  Calcium Carb-Cholecalciferol (CALCIUM  600/VITAMIN D3) 600-800 MG-UNIT TABS Take 1 tablet by mouth 2 (two) times daily with a meal.    CINNAMON PO Take 1,000 mg by mouth daily.    CRANBERRY PO Take 84 mg by mouth daily.    lisinopril (PRINIVIL,ZESTRIL) 2.5 MG tablet Take 1 tablet (2.5 mg total) by mouth daily. Qty: 90 tablet, Refills: 3    metFORMIN (GLUCOPHAGE) 500 MG tablet Take 1 tablet (500 mg total) by mouth 2 (two) times daily with a meal. Qty: 180 tablet, Refills: 3    metoprolol (LOPRESSOR) 50 MG tablet Take 1 tablet (50 mg total) by mouth 2 (two) times daily. Qty: 180 tablet, Refills: 3    Multiple Vitamin (MULTIVITAMIN WITH MINERALS) TABS Take 1 tablet by mouth daily.    naproxen sodium (ANAPROX) 220 MG tablet Take 220 mg by mouth daily as needed (pain).    NIFEdipine (PROCARDIA XL/ADALAT-CC) 60 MG 24 hr tablet Take 1 tablet (60 mg total) by mouth daily. Qty: 90 tablet, Refills: 3    Omega-3 Fatty Acids (FISH OIL CONCENTRATE) 300 MG CAPS Take 1 capsule by mouth daily.    raloxifene (EVISTA) 60 MG tablet Take 1 tablet (60 mg total) by mouth daily. Qty: 90 tablet, Refills: 3    alendronate (FOSAMAX) 70 MG tablet Take 1 tablet (70 mg total) by mouth every 7 (seven) days. Qty: 12 tablet, Refills: 3  ALLERGIES:  No Known Allergies  BRIEF HPI:  See H&P, Labs, Consult and Test reports for all details in brief, patient is a 74 year old Caucasian female with history of smoking, dyslipidemia, type 2 diabetes and essential hypertension admitted to the hospital with dense right-sided hemiparesis.MRI brain demonstrated acute left frontal lobe infarcts.   CONSULTATIONS:   cardiology, neurology, rehabilitation medicine and vascular surgery  PERTINENT RADIOLOGIC STUDIES: Ct Head Wo Contrast  03/30/2015   ADDENDUM REPORT: 03/30/2015 15:35  ADDENDUM: Critical Value/emergent results were called by telephone at the time of interpretation on 03/30/2015 at 3:35 pm to Dr. Aram Beecham, neurology , who verbally acknowledged  these results.   Electronically Signed   By: Lowella Grip III M.D.   On: 03/30/2015 15:35   03/30/2015   CLINICAL DATA:  Acute onset right lower extremity weakness  EXAM: CT HEAD WITHOUT CONTRAST  TECHNIQUE: Contiguous axial images were obtained from the base of the skull through the vertex without intravenous contrast.  COMPARISON:  January 29, 2015  FINDINGS: There is mild diffuse atrophy. There is no intracranial mass hemorrhage, extra-axial fluid collection, or midline shift. There is evidence of a prior small infarct in the mid left cerebellum, stable. There is small vessel disease throughout the periventricular white matter bilaterally. There is a new area of decreased attenuation in the posterior right corona radiata which may represent an acute/recent small white matter infarct in this area. No other new gray-white compartment lesion identified. There is small vessel disease in the anterior limb of the left internal capsule, stable. There is also a prior small infarct in the right thalamus, stable. Bony calvarium appears intact. The mastoid air cells are clear. The middle cerebral arteries have symmetric attenuation and appear stable bilaterally.  IMPRESSION: Suspect new small white matter infarct in the right corona radiata. Extensive supratentorial/periventricular small vessel disease remains. Prior small infarct in the anterior limb of the left internal capsule as well as in the right thalamus and left cerebellum remains stable. No hemorrhage or mass effect.  Electronically Signed: By: Lowella Grip III M.D. On: 03/30/2015 15:25   Ct Angio Neck W/cm &/or Wo/cm  03/31/2015   CLINICAL DATA:  74 year old female with small acute left frontal lobe infarcts diagnosed on MRI during evaluation of right lower extremity weakness. Initial encounter.  EXAM: CT ANGIOGRAPHY NECK  TECHNIQUE: Multidetector CT imaging of the neck was performed using the standard protocol during bolus administration of  intravenous contrast. Multiplanar CT image reconstructions and MIPs were obtained to evaluate the vascular anatomy. Carotid stenosis measurements (when applicable) are obtained utilizing NASCET criteria, using the distal internal carotid diameter as the denominator.  CONTRAST:  105mL OMNIPAQUE IOHEXOL 350 MG/ML SOLN  COMPARISON:  Brain MRI and intracranial MRA 03/30/2015, and earlier  FINDINGS: Aortic arch: The aortic arch is not entirely included on these images. There is a bovine arch configuration. There is moderate to severe arch atherosclerosis including mural plaque or thrombus up to 8 mm in the distal arch.  Right carotid system: No brachiocephalic or right CCA origin stenosis. Mildly tortuous right CCA with a partially retropharyngeal course. Soft and calcified plaque at the right carotid bifurcation but no right ICA origin stenosis. Tortuous right ICA with a partially retropharyngeal course, otherwise negative to the skullbase. Visible right ICA siphon is patent with calcified plaque.  Left carotid system: Calcified plaque at the left CCA origin resulting in up to 50 % stenosis with respect to the distal vessel (coronal image 144). Beyond its origin the  left CCA is mildly tortuous. There is calcified plaque at the left carotid bifurcation and involving the posterior left ICA bulb but not resulting in left ICA origin stenosis. Tortuous left ICA just beyond the bulb with a mildly kinked appearance. Otherwise negative to the skullbase. Visible left ICA siphon is patent with calcified plaque.  Vertebral arteries:  Calcified plaque at the right subclavian artery origin resulting in stenosis of less than 50 % with respect to the distal vessel . Calcified plaque at the right vertebral artery origin with mild stenosis. Otherwise negative right vertebral artery to the vertebrobasilar junction. Normal right PICA origin.  Calcified plaque in the left subclavian artery origin with stenosis up to 60 % with respect to the  distal vessel. Mild calcified plaque at the left vertebral artery origin with mild stenosis. The proximal left vertebral artery is tortuous. The vertebral arteries are codominant. The left is otherwise negative to the vertebrobasilar junction. Dominant appearing left AICA.  Skeleton: Largely absent dentition. Degenerative changes in the cervical spine. Thoracic scoliosis. No acute osseous abnormality identified. Mild bubbly opacity in the right posterior ethmoid air cells. Other Visualized paranasal sinuses and mastoids are clear.  Other neck: Thyromegaly with bilateral less than 15 mm hypodense thyroid nodules. Larynx, pharynx, parapharyngeal spaces and sublingual space are within normal limits. Negative retropharyngeal space aside from a retropharyngeal course of the carotids, more so the right. Submandibular and parotid glands are within normal limits. Negative orbits soft tissues.  No cervical lymphadenopathy.  Dependent atelectasis in the left upper lobe. No superior mediastinal lymphadenopathy.  IMPRESSION: 1. 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 No other Hemodynamically significant stenosis of the carotid or vertebral arteries in the neck. 2. 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   Electronically Signed   By: Genevie Ann M.D.   On: 03/31/2015 07:21   Mr Jodene Nam Head Wo Contrast  03/30/2015   CLINICAL DATA:  Right leg weakness.  EXAM: MRI HEAD WITHOUT CONTRAST  MRA HEAD WITHOUT CONTRAST  TECHNIQUE: Multiplanar, multiecho pulse sequences of the brain and surrounding structures were obtained without intravenous contrast. Angiographic images of the head were obtained using MRA technique without contrast.  COMPARISON:  Head CT 03/30/2015 and MRI 06/21/2005  FINDINGS: MRI HEAD FINDINGS  There are several punctate acute infarcts in the high, medial left frontal lobe which are predominantly subcortical in location.  There is no evidence of intracranial hemorrhage, mass, midline shift, or extra-axial fluid collection. There is mild generalized cerebral atrophy. Patchy and confluent T2 hyperintensities in the periventricular greater than subcortical cerebral white matter have increased from the prior MRI and are nonspecific but compatible with moderate chronic small vessel ischemic disease. Chronic lacunar infarcts are noted in the left corona radiata and right greater than left thalami. A small, chronic left cerebellar infarct is also noted.  Prior bilateral cataract extraction is noted. Paranasal sinuses mastoid air cells are clear. Major intracranial vascular flow voids are preserved.  MRA HEAD FINDINGS  Images are mildly to moderately motion degraded. Visualized distal vertebral arteries are patent and codominant without stenosis. Right PICA origin is patent. Left PICA is not identified. AICA and SCA origins are patent. Basilar artery is patent without stenosis. There is a fetal origin of the left PCA. There is moderate right P1 segment stenosis.  Internal carotid arteries are patent from skullbase to carotid termini without evidence of  significant stenosis. M1 and A1 segments are patent without stenosis. A2 segments and MCA branch vessels are suboptimally evaluated due to motion artifact but appear patent without significant proximal stenosis identified. No intracranial aneurysm is identified.  IMPRESSION: 1. Punctate acute left frontal lobe infarcts. 2. Moderate chronic small vessel ischemic disease and chronic lacunar infarcts as above. 3. No major intracranial arterial occlusion or significant proximal anterior circulation stenosis. Moderate proximal right PCA stenosis.   Electronically Signed   By: Logan Bores   On: 03/30/2015 16:52   Mr Brain Wo Contrast  03/30/2015   CLINICAL DATA:  Right leg weakness.  EXAM: MRI HEAD WITHOUT CONTRAST  MRA HEAD WITHOUT CONTRAST  TECHNIQUE: Multiplanar, multiecho pulse sequences of  the brain and surrounding structures were obtained without intravenous contrast. Angiographic images of the head were obtained using MRA technique without contrast.  COMPARISON:  Head CT 03/30/2015 and MRI 06/21/2005  FINDINGS: MRI HEAD FINDINGS  There are several punctate acute infarcts in the high, medial left frontal lobe which are predominantly subcortical in location. There is no evidence of intracranial hemorrhage, mass, midline shift, or extra-axial fluid collection. There is mild generalized cerebral atrophy. Patchy and confluent T2 hyperintensities in the periventricular greater than subcortical cerebral white matter have increased from the prior MRI and are nonspecific but compatible with moderate chronic small vessel ischemic disease. Chronic lacunar infarcts are noted in the left corona radiata and right greater than left thalami. A small, chronic left cerebellar infarct is also noted.  Prior bilateral cataract extraction is noted. Paranasal sinuses mastoid air cells are clear. Major intracranial vascular flow voids are preserved.  MRA HEAD FINDINGS  Images are mildly to moderately motion degraded. Visualized distal vertebral arteries are patent and codominant without stenosis. Right PICA origin is patent. Left PICA is not identified. AICA and SCA origins are patent. Basilar artery is patent without stenosis. There is a fetal origin of the left PCA. There is moderate right P1 segment stenosis.  Internal carotid arteries are patent from skullbase to carotid termini without evidence of significant stenosis. M1 and A1 segments are patent without stenosis. A2 segments and MCA branch vessels are suboptimally evaluated due to motion artifact but appear patent without significant proximal stenosis identified. No intracranial aneurysm is identified.  IMPRESSION: 1. Punctate acute left frontal lobe infarcts. 2. Moderate chronic small vessel ischemic disease and chronic lacunar infarcts as above. 3. No major  intracranial arterial occlusion or significant proximal anterior circulation stenosis. Moderate proximal right PCA stenosis.   Electronically Signed   By: Logan Bores   On: 03/30/2015 16:52   Ct Angio Chest Aorta W/cm &/or Wo/cm  03/31/2015   CLINICAL DATA:  Altered mental status, evaluate for aortic thrombus  EXAM: CT ANGIOGRAPHY CHEST WITH CONTRAST  TECHNIQUE: Multidetector CT imaging of the chest was performed using the standard protocol during bolus administration of intravenous contrast. Multiplanar CT image reconstructions and MIPs were obtained to evaluate the vascular anatomy.  CONTRAST:  127mL OMNIPAQUE IOHEXOL 300 MG/ML  SOLN  COMPARISON:  None.  FINDINGS: Unenhanced CT, there is no evidence of mediastinal hematoma.  No evidence of thoracic aortic aneurysm or dissection.  Atherosclerotic calcifications of the aortic arch/descending thoracic aorta. Eccentric mural thrombus along the proximal descending thoracic aorta (series 4/image 23) and along the distal descending thoracic aorta (series 4/ image 78).  Although not tailored for evaluation of the pulmonary arteries, there is no evidence of pulmonary embolism to the segmental level.  Mediastinum/Nodes: Heart is top-normal in  size. No pericardial effusion.  No suspicious mediastinal, hilar, or axillary lymphadenopathy.  Visualized thyroid is enlarged/ nodular.  Lungs/Pleura: Mild dependent atelectasis in the bilateral upper lobes.  No suspicious pulmonary nodules.  No focal consolidation.  No pleural effusion or pneumothorax.  Upper abdomen: Visualized upper abdomen is within normal limits.  Musculoskeletal: Mild thoracic dextroscoliosis.  Review of the MIP images confirms the above findings.  IMPRESSION: No overt cardioembolic source identified. 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  No evidence of thoracic aortic aneurysm or dissection.  No evidence of pulmonary  embolism.   Electronically Signed   By: Julian Hy M.D.   On: 03/31/2015 21:35     PERTINENT LAB RESULTS: CBC:  Recent Labs  03/30/15 1212 03/30/15 1217  WBC 6.2  --   HGB 13.4 14.6  HCT 40.9 43.0  PLT 204  --    CMET CMP     Component Value Date/Time   NA 142 03/30/2015 1217   NA 142 02/21/2015 0810   K 3.9 03/30/2015 1217   CL 105 03/30/2015 1217   CO2 26 03/30/2015 1212   GLUCOSE 132* 03/30/2015 1217   GLUCOSE 130* 02/21/2015 0810   BUN 15 03/30/2015 1217   BUN 16 02/21/2015 0810   CREATININE 0.60 03/30/2015 1217   CREATININE 0.66 09/06/2014 0925   CALCIUM 9.7 03/30/2015 1212   PROT 7.4 03/30/2015 1212   ALBUMIN 4.0 03/30/2015 1212   AST 22 03/30/2015 1212   ALT 16 03/30/2015 1212   ALKPHOS 40 03/30/2015 1212   BILITOT 0.5 03/30/2015 1212   GFRNONAA 89* 03/30/2015 1212   GFRAA >90 03/30/2015 1212    GFR Estimated Creatinine Clearance: 44.2 mL/min (by C-G formula based on Cr of 0.6). No results for input(s): LIPASE, AMYLASE in the last 72 hours. No results for input(s): CKTOTAL, CKMB, CKMBINDEX, TROPONINI in the last 72 hours. Invalid input(s): POCBNP No results for input(s): DDIMER in the last 72 hours.  Recent Labs  03/30/15 1805 03/31/15 0625  HGBA1C 7.0* 6.9*    Recent Labs  03/31/15 0625  CHOL 166  HDL 41  LDLCALC 91  TRIG 170*  CHOLHDL 4.0   No results for input(s): TSH, T4TOTAL, T3FREE, THYROIDAB in the last 72 hours.  Invalid input(s): FREET3 No results for input(s): VITAMINB12, FOLATE, FERRITIN, TIBC, IRON, RETICCTPCT in the last 72 hours. Coags:  Recent Labs  03/31/15 1224 04/02/15 0649  INR 1.07 1.13   Microbiology: No results found for this or any previous visit (from the past 240 hour(s)).   BRIEF HOSPITAL COURSE:   Principal Problem:   Acute CVA (cerebral infarction):admitted with right sided weakness. Further work up with a MRI Brain demon started multiple left punctate ACA territory infarcts, suspected to be  embolic. Underwent CTA neck which showed no large vessel stenosis, however found to have possible descending aorta thrombus. 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 LDL not at goal, hence Pravachol increased to 40 mg.  Recommendations from Neurology are to continue Aspirin, till INR is >2 and then stop.  Active Problems:   Aortic mural thrombus:see above. As per Dr. Bridgett Larsson, repeat CTA chest/abdomen/pelvis in 6 months. Please ensure follow up with Dr Bridgett Larsson on discharge.     Dyslipidemia: Increased statin for better LDL control.Recheck lipid panel in 3-6 months.     Hypertension:controlled,  continue Nifedipine,  beta blocker and low-dose ACE inhibitor continue.    Smoking: Counseled to quit    DM2: A1c 6.9,resume Metformin on discharge.    TODAY-DAY OF DISCHARGE:  Subjective:   Fatina Sprankle today has no headache,no chest abdominal pain,no new weakness tingling or numbness.Continues to have right sided weakness.   Objective:   Blood pressure 145/63, pulse 71, temperature 98 F (36.7 C), temperature source Oral, resp. rate 18, height 5\' 1"  (1.549 m), weight 45.36 kg (100 lb), SpO2 98 %. No intake or output data in the 24 hours ending 04/02/15 0838 Filed Weights   03/30/15 1144  Weight: 45.36 kg (100 lb)    Exam Awake Alert, Oriented *3 [place].AT,PERRAL Supple Neck,No JVD, No cervical lymphadenopathy appriciated.  Symmetrical Chest wall movement, Good air movement bilaterally, CTAB RRR,No Gallops,Rubs or new Murmurs, No Parasternal Heave Dense Right sided weakness  DISCHARGE CONDITION: Stable  DISPOSITION: CIR  DISCHARGE INSTRUCTIONS:    Activity:  As tolerated with Full fall precautions use Cake/cane & assistance as needed  Diet recommendation: Diabetic Diet Heart Healthy diet Dysphagia 3 diet  Discharge Instructions    Ambulatory referral  to Neurology    Complete by:  As directed   Pt will follow up with Dr. Erlinda Hong at [facility] in about 2 months. Thanks.     Diet - low sodium heart healthy    Complete by:  As directed   Dysphagia 3 diet with thin liquids     Diet Carb Modified    Complete by:  As directed      Increase activity slowly    Complete by:  As directed            Follow-up Information    Follow up with Xu,Jindong, MD. Schedule an appointment as soon as possible for a visit in 2 months.   Specialty:  Neurology   Why:  stroke clinic   Contact information:   [place] [id number] [phone]       Follow up with [name], MD. Schedule an appointment as soon as possible for a visit in 1 week.   Specialty:  Family Medicine   Why:  after discharge from [place] information:   [place] [id number] [phone]       Follow up with Hinda Lenis, MD. Schedule an appointment as soon as possible for a visit in 3 months.   Specialty:  Vascular Surgery   Contact information:   [address] [place] [address] [phone]       Total Time spent on discharge equals 45 minutes.  SignedOren Binet 04/02/2015 8:38 AM

## 2015-04-03 ENCOUNTER — Inpatient Hospital Stay (HOSPITAL_COMMUNITY): Payer: Medicare HMO | Admitting: Occupational Therapy

## 2015-04-03 ENCOUNTER — Inpatient Hospital Stay (HOSPITAL_COMMUNITY): Payer: Medicare HMO

## 2015-04-03 LAB — GLUCOSE, CAPILLARY
GLUCOSE-CAPILLARY: 133 mg/dL — AB (ref 70–99)
Glucose-Capillary: 123 mg/dL — ABNORMAL HIGH (ref 70–99)
Glucose-Capillary: 149 mg/dL — ABNORMAL HIGH (ref 70–99)
Glucose-Capillary: 120 mg/dL — ABNORMAL HIGH (ref 70–99)

## 2015-04-03 LAB — PROTIME-INR
INR: 1.71 — ABNORMAL HIGH (ref 0.00–1.49)
Prothrombin Time: 20.2 seconds — ABNORMAL HIGH (ref 11.6–15.2)

## 2015-04-03 MED ORDER — WARFARIN SODIUM 2.5 MG PO TABS
2.5000 mg | ORAL_TABLET | Freq: Once | ORAL | Status: AC
  Administered 2015-04-03: 2.5 mg via ORAL
  Filled 2015-04-03: qty 1

## 2015-04-03 MED ORDER — SODIUM CHLORIDE 0.45 % IV SOLN
Freq: Two times a day (BID) | INTRAVENOUS | Status: DC
  Administered 2015-04-03: 19:00:00 via INTRAVENOUS

## 2015-04-03 NOTE — Progress Notes (Signed)
Nursing Note: Pt holds both solids and liquids in her mouth for extended lengths of time,despite repeated cues to swallow.Crushed meds and gave in puree.It took a very long time to get meds in as pt held each bite and required many cues to swalllow.Pt did fine when she did swallow her meds but was just very delayed.It took a very long time just to get a few meds in.Attempted to give applesauce for a bedtime snack but pt only took 1/3 of container due to [employer] bolus in her mouth.wbb

## 2015-04-03 NOTE — Progress Notes (Signed)
ANTICOAGULATION CONSULT NOTE - Follow-up  Pharmacy Consult for warfarin Indication: aortic clot  No Known Allergies  Patient Measurements:   Heparin Dosing Weight:   Vital Signs: Temp: 98.2 F (36.8 C) (04/17 0515) Temp Source: Oral (04/17 0515) BP: 163/71 mmHg (04/17 0515) Pulse Rate: 61 (04/17 0515)  Labs:  Recent Labs  04/02/15 0649 04/02/15 1810 04/03/15 1142  HGB  --  13.3  --   HCT  --  40.0  --   PLT  --  200  --   LABPROT 14.6  --  20.2*  INR 1.13  --  1.71*  CREATININE  --  0.59  --     Estimated Creatinine Clearance: 44.2 mL/min (by C-G formula based on Cr of 0.59).  Assessment: 45 yof presented to the hospital with extremity weakness. Found to have multiple L frontal lobe infarcts. Also likely an aortic clot. She continues on warfarin for anticoagulation. INR is subtherapeutic as expected after only two doses of warfarin but with a large increase. She is also on full dose aspirin and enoxaparin. These will be discontinued when INR is therapeutic.   Goal of Therapy:  INR 2-3 Monitor platelets by anticoagulation protocol: Yes   Plan:  - Decrease Warfarin to 2.5mg  PO x 1 tonight - Daily INR - DC enoxaparin and aspirin when INR is therapeutic  Legrand Como, Pharm.D., BCPS, AAHIVP Clinical Pharmacist Phone: [phone] or [phone] 04/03/2015, 1:03 PM

## 2015-04-03 NOTE — Evaluation (Signed)
Physical Therapy Assessment and Plan  Patient Details  Name: Laura Melendez MRN: [id number] Date of Birth: 08/04/1941  PT Diagnosis: Abnormal posture, Abnormality of gait, Cognitive deficits, Coordination disorder, Difficulty walking, Hemiparesis dominant, Hypotonia and Impaired sensation Rehab Potential: Fair ELOS: 3 weeks   Today's Date: 04/03/2015 PT Individual Time: 1520-1620 PT Individual Time Calculation (min): 60 min    Problem List:  Patient Active Problem List   Diagnosis Date Noted  . 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 Aortic thrombus   . Stroke with cerebral ischemia   . CVA (cerebral infarction) 03/30/2015  . 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 Fever   . HCAP (healthcare-associated pneumonia) 01/29/2015  . Sepsis 01/29/2015  . DM (diabetes mellitus) 03/23/2014  . Osteoporosis, unspecified 03/23/2014  . Dyslipidemia 03/23/2014  . Tobacco abuse 03/23/2014  . Syncope 01/01/2014  . Essential hypertension, benign 01/01/2014    Past Medical History:  Past Medical History  Diagnosis Date  . Essential hypertension, benign   . Type 2 diabetes mellitus   . Osteoporosis 06/2008  . Stroke    Past Surgical History:  Past Surgical History  Procedure Laterality Date  . Ankle surgery Left 2004    otif  . Abdominal hysterectomy    . Anterior and posterior repair N/A 03/11/2013    Procedure: ANTERIOR (CYSTOCELE) AND POSTERIOR REPAIR (RECTOCELE);  Surgeon: Linda Hedges, DO;  Location: [facility];  Service: Gynecology;  Laterality: N/A;  with sacrospinous ligament fixation bilateral  . Colonoscopy  2009    negative  . Cataract extraction w/phaco Right 12/27/2014    Procedure: CATARACT EXTRACTION PHACO AND INTRAOCULAR LENS PLACEMENT RIGHT EYE;  Surgeon: Tonny Branch, MD;  Location: [facility];  Service: Ophthalmology;  Laterality: Right;  CDE:10.63  . Cataract extraction w/phaco Left 01/13/2015    Procedure: CATARACT EXTRACTION PHACO AND  INTRAOCULAR LENS PLACEMENT LEFT EYE;  Surgeon: Tonny Branch, MD;  Location: [facility];  Service: Ophthalmology;  Laterality: Left;  CDE:18.87  . Cholecystectomy      Assessment & Plan Clinical Impression: Laura Melendez is a 74 y.o. RH-female with history of DM type 2, HTN, DDD lumbar spine, recent febrile illness with negative workup; who was admitted on 03/30/15 with MS changes, incontinence and weakness RLE. 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 MRI brain done revealing subcortical punctate acute infarcts in the high, medial left frontal lobe and MRA without major intracranial occlusion. 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 CTA chest recommended for work up per Dr. Bridgett Larsson and anticoagulation if positive for thrombus. CTA chest revealed eccentric mural thrombus along proximal and descending aorta and patient was cleared to initiate coumadin for likely embolic stroke. TEE done revealing EF 55-60% with grade 1 diastolic dysfunction, severe atheromatous plaque in aortic arch with extensive and deep ulceration but no thrombus. Patient with resultant right hemiparesis, decreased vocal intensity with decreased awareness of deficits and delayed processing. CIR recommended by MD and Rehab team and patient cleared for admission today.  Patient transferred to CIR on 04/02/2015 .   Patient currently requires total with mobility secondary to muscle weakness, decreased cardiorespiratoy endurance, impaired timing and sequencing, abnormal tone, motor apraxia and decreased coordination and decreased motor planning and ideational apraxia.  Prior to hospitalization, patient was independent  with mobility and lived with Spouse in a House home.  Home access is 2Stairs to enter.  Patient will benefit from skilled PT intervention to maximize safe functional mobility, minimize fall risk and decrease caregiver burden for planned  discharge home with 24 hour assist vs SNF pending progress.  Anticipate patient will benefit from follow up [place] at discharge.     Skilled Therapeutic Intervention PT eval completed. Pt overall max-total for all mobility due to significantly decreased initiation, decreased balance reactions, motor perseverations, and flaccid RUE/RLE. Only initiation pt demonstrated was pulling up on rail to move sit>stand and stepping bilaterally when up and walking. Anticipate that pt may be SNF candidate at discharge, pending progress with functional mobility and self care.   PT Evaluation Precautions/Restrictions Precautions Precautions: Fall General Chart Reviewed: Yes Vital SignsTherapy Vitals Temp: 97.9 F (36.6 C) Temp Source: Oral Pulse Rate: 69 Resp: 18 BP: (!) 155/61 mmHg (reported to [employer]) Patient Position (if appropriate): Lying Oxygen Therapy SpO2: 95 % O2 Device: Not Delivered Pain   Home Living/Prior Functioning Home Living Available Help at Discharge: Family;Available 24 hours/day Type of Home: House Home Access: Stairs to enter [employer] of Steps: 2 Entrance Stairs-Rails: Left Home Layout: One level  Lives With: Spouse Prior Function Level of Independence: Independent with basic ADLs;Independent with homemaking with ambulation;Independent with gait  Able to Take Stairs?: Yes Driving: Yes Vocation: Retired Comments: Still drives, Independent Vision/Perception     Cognition Overall Cognitive Status: Impaired/Different from baseline Arousal/Alertness: Lethargic Orientation Level: Oriented to person;Oriented to place Awareness: Impaired Awareness Impairment: Intellectual impairment;Emergent impairment;Anticipatory impairment Behaviors: Lability Sensation Sensation Light Touch: Impaired by gross assessment Coordination Gross Motor Movements are Fluid and Coordinated: No Coordination and Movement Description: decreased initiation  Motor   Motor Motor: Hemiplegia Motor - Skilled Clinical Observations: flaccid RUE/RLE  Mobility Bed Mobility Bed Mobility: Supine to Sit;Sit to Supine Supine to Sit: 1: +1 Total assist Sit to Supine: 1: +1 Total assist Transfers Sit to Stand: 2: Max assist (pulling on rail) Squat Pivot Transfers: 1: +1 Total assist;2: Max assist Locomotion  Ambulation Ambulation: Yes Ambulation/Gait Assistance: 1: +2 Total assist;2: Max assist (+2 for w/c follow) Ambulation Distance (Feet): 15 Feet Assistive device:  (rail on L) Ambulation/Gait Assistance Details: Tactile cues for placement;Tactile cues for weight shifting;Manual facilitation for weight shifting;Manual facilitation for placement Stairs / Additional Locomotion Stairs: No  Trunk/Postural Assessment  Cervical Assessment Cervical Assessment: Within Functional Limits Thoracic Assessment Thoracic Assessment: Within Functional Limits Lumbar Assessment Lumbar Assessment: Within Functional Limits Postural Control Postural Control: Deficits on evaluation Righting Reactions: required HOH assist to correct seated LOB  Balance Balance Balance Assessed: Yes Static Sitting Balance Static Sitting - Level of Assistance: 2: Max assist Dynamic Sitting Balance Dynamic Sitting - Level of Assistance: 1: +1 Total assist Sitting balance - Comments: unable to maintain static seated balance due to constant posterior LOB Extremity Assessment      RLE Assessment RLE Assessment: Exceptions to [facility] RLE Tone RLE Tone: Flaccid LLE Assessment LLE Assessment: Within Functional Limits  FIM:  FIM - Bed/Chair Transfer Bed/Chair Transfer: 1: Supine > Sit: Total A (helper does all/Pt. < 25%);1: Sit > Supine: Total A (helper does all/Pt. < 25%);1: Bed > Chair or W/C: Total A (helper does all/Pt. < 25%);1: Chair or W/C > Bed: Total A (helper does all/Pt. < 25%) FIM - Locomotion: Wheelchair Locomotion: Wheelchair: 1: Total Assistance/staff pushes wheelchair  (Pt<25%) FIM - Locomotion: Ambulation Locomotion: Ambulation Assistive Devices:  (rail on L) Ambulation/Gait Assistance: 1: +2 Total assist;2: Max assist (+2 for w/c follow) Locomotion: Ambulation: 1: Two helpers  FIM - Locomotion: Stairs Locomotion: Stairs: 0: Activity did not occur   Refer to Care Plan for Long Term Goals  Recommendations for other services: Neuropsych  Discharge Criteria: Patient will be discharged from PT if patient refuses treatment 3 consecutive times without medical reason, if treatment goals not met, if there is a change in medical status, if patient makes no progress towards goals or if patient is discharged from hospital.  The above assessment, treatment plan, treatment alternatives and goals were discussed and mutually agreed upon: by patient and by family  Rada Hay 04/03/2015, 7:09 PM

## 2015-04-03 NOTE — Progress Notes (Signed)
Nursing Note: Pt resting quietly in bed.Pt slow to respond and requires multiple cues to get her to  verbally respond.Pt just looks at you and eventually nods after multiple requests.Pt finally spoke when asked what her name was and was able to state it clearly.Pt did not remember her date of birth.Oriented pt's husband to Rehabilitation  Process and obtained health hx information from Husband.Stroke booklet,Pt Handbook and Rehab.booklet given to Husband.Reviewed with Husband-stroke,warning signs,tia and will need further education.Husband asked to read booklet and that he will be given more education.All questions answered for now.wbb

## 2015-04-03 NOTE — Evaluation (Signed)
Occupational Therapy Assessment and Plan  Patient Details  Name: Laura Melendez MRN: [id number] Date of Birth: [birth year]  OT Diagnosis: abnormal posture, altered mental status, flaccid hemiplegia and hemiparesis, hemiplegia affecting dominant side and muscle weakness (generalized) Rehab Potential: Rehab Potential (ACUTE ONLY): Good ELOS: 3 weeks   Today's Date: 04/03/2015 OT Individual Time: 1100-1230 OT Individual Time Calculation (min): 90 min     Problem List:  Patient Active Problem List   Diagnosis Date Noted  . 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 Aortic thrombus   . Stroke with cerebral ischemia   . CVA (cerebral infarction) 03/30/2015  . 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 Fever   . HCAP (healthcare-associated pneumonia) 01/29/2015  . Sepsis 01/29/2015  . DM (diabetes mellitus) 03/23/2014  . Osteoporosis, unspecified 03/23/2014  . Dyslipidemia 03/23/2014  . Tobacco abuse 03/23/2014  . Syncope 01/01/2014  . Essential hypertension, benign 01/01/2014    Past Medical History:  Past Medical History  Diagnosis Date  . Essential hypertension, benign   . Type 2 diabetes mellitus   . Osteoporosis 06/2008  . Stroke    Past Surgical History:  Past Surgical History  Procedure Laterality Date  . Ankle surgery Left 2004    otif  . Abdominal hysterectomy    . Anterior and posterior repair N/A 03/11/2013    Procedure: ANTERIOR (CYSTOCELE) AND POSTERIOR REPAIR (RECTOCELE);  Surgeon: Linda Hedges, DO;  Location: [facility];  Service: Gynecology;  Laterality: N/A;  with sacrospinous ligament fixation bilateral  . Colonoscopy  2009    negative  . Cataract extraction w/phaco Right 12/27/2014    Procedure: CATARACT EXTRACTION PHACO AND INTRAOCULAR LENS PLACEMENT RIGHT EYE;  Surgeon: Tonny Branch, MD;  Location: [facility];  Service: Ophthalmology;  Laterality: Right;  CDE:10.63  . Cataract extraction w/phaco Left 01/13/2015    Procedure: CATARACT  EXTRACTION PHACO AND INTRAOCULAR LENS PLACEMENT LEFT EYE;  Surgeon: Tonny Branch, MD;  Location: [facility];  Service: Ophthalmology;  Laterality: Left;  CDE:18.87  . Cholecystectomy      Assessment & Plan Clinical Impression:C Tolsma is a 74 y.o. RH-female with history of DM type 2, HTN, DDD lumbar spine, recent febrile illness with negative workup; who was admitted on 03/30/15 with MS changes, incontinence and weakness RLE. 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 MRI brain done revealing subcortical punctate acute infarcts in the high, medial left frontal lobe and MRA without major intracranial occlusion. 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 CTA chest recommended for work up per Dr. Bridgett Larsson and anticoagulation if positive for thrombus. CTA chest revealed eccentric mural thrombus along proximal and descending aorta and patient was cleared to initiate coumadin for likely embolic stroke. TEE done revealing EF 55-60% with grade 1 diastolic dysfunction, severe atheromatous plaque in aortic arch with extensive and deep ulceration but no thrombus. Patient with resultant right hemiparesis, decreased vocal intensity with decreased awareness of deficits and delayed processing. CIR recommended by MD and Rehab team and patient cleared for admission today.    Patient transferred to CIR on 04/02/2015 .    Patient currently requires total with basic self-care skills secondary to muscle weakness and muscle paralysis, impaired timing and sequencing, abnormal tone, unbalanced muscle activation, ataxia and decreased motor planning, decreased midline orientation and decreased initiation, decreased awareness, decreased problem solving, decreased safety awareness, decreased memory and delayed processing.  Prior to  hospitalization, patient could complete  BADL and IADL with independent .  Patient will benefit from skilled intervention to  decrease level of assist with basic self-care skills and increase independence with basic self-care skills prior to discharge home with care partner.  Anticipate patient will require 24 hour supervision and follow up home health.  OT - End of Session Activity Tolerance: Tolerates 30+ min activity with multiple rests Endurance Deficit: Yes OT Assessment Rehab Potential (ACUTE ONLY): Good Barriers to Discharge: Inaccessible home environment (5 steps to enter) OT Patient demonstrates impairments in the following area(s): Balance;Cognition;Edema;Endurance;Motor;Pain;Perception;Safety OT Basic ADL's Functional Problem(s): Eating;Grooming;Bathing;Dressing;Toileting OT Advanced ADL's Functional Problem(s): Simple Meal Preparation;Laundry;Light Housekeeping OT Transfers Functional Problem(s): Toilet;Tub/Shower OT Additional Impairment(s): Fuctional Use of Upper Extremity OT Plan OT Intensity: Minimum of 1-2 x/day, 45 to 90 minutes OT Frequency: 5 out of 7 days OT Duration/Estimated Length of Stay: 3 weeks OT Treatment/Interventions: Balance/vestibular training;Cognitive remediation/compensation;Discharge planning;DME/adaptive equipment instruction;Functional electrical stimulation;Functional mobility training;Neuromuscular re-education;Pain management;Patient/family education;Self Care/advanced ADL retraining;Splinting/orthotics;Therapeutic Activities;Therapeutic Exercise;UE/LE Strength taining/ROM;UE/LE Coordination activities;Wheelchair propulsion/positioning OT Self Feeding Anticipated Outcome(s): SBA  OT Basic Self-Care Anticipated Outcome(s): minimal assist OT Toileting Anticipated Outcome(s): minimal assist OT Bathroom Transfers Anticipated Outcome(s): minimal assist OT Recommendation Recommendations for Other Services: Speech consult Patient destination: Home Follow Up Recommendations: Home health OT Equipment Recommended: Tub/shower bench;3 in 1 bedside comode   Skilled Therapeutic  Intervention OT addressed bed mobility, Sitting balance, transfers, sit to stand during functional tasks.  Pt. Was total assist with rolling to left and supine to sit.  Sat EOB with max assist.  Transferred from bed to [facility] with total +2.  Pt very quiet during session.  She responded to 50 % of yes/no questions and told her son "Loved him".    Husband not present but son reports he is in good health.     OT Evaluation Precautions/Restrictions  Precautions Precautions: Fall Restrictions Weight Bearing Restrictions: No      Pain   PMH of Both rotator cuff pain in shoulders with left being worse than right.     Home Living/Prior Functioning Home Living Available Help at Discharge: Family Type of Home: House Home Access: Stairs to enter [employer] of Steps: 5 Entrance Stairs-Rails: Left, Right Home Layout: One level  Lives With: Spouse IADL History Homemaking Responsibilities: Yes Meal Prep Responsibility: Primary Laundry Responsibility: Primary Cleaning Responsibility: Primary Bill Paying/Finance Responsibility: No Shopping Responsibility: Secondary Current License: Yes Mode of Transportation: Car Leisure and Hobbies:  (takes care of Roxie, dog.  PPt feeds her; husb walks) Prior Function Level of Independence: Independent with basic ADLs, Independent with homemaking with ambulation, Independent with gait  Able to Take Stairs?: Reciprically Driving: Yes Vocation: Retired Comments:  (husband does all driving; pt was independent) ADL   Vision/Perception  Vision- Assessment Eye Alignment: Within Functional Limits Perception Perception: Within Functional Limits Praxis Praxis: Impaired Praxis Impairment Details: Initiation  Cognition Overall Cognitive Status: Impaired/Different from baseline Arousal/Alertness: Lethargic Orientation Level: Oriented to person;Oriented to place Attention: Sustained Sustained Attention: Appears intact Awareness:  Impaired Awareness Impairment: Intellectual impairment;Emergent impairment;Anticipatory impairment Behaviors: Lability Safety/Judgment: Impaired Sensation Sensation Light Touch: Impaired by gross assessment Coordination Gross Motor Movements are Fluid and Coordinated: No Fine Motor Movements are Fluid and Coordinated: No Coordination and Movement Description: decreased initiation  Motor  Motor Motor: Hemiplegia Motor - Skilled Clinical Observations: flaccid RUE/RLE Mobility  Bed Mobility Bed Mobility: Rolling Left;Left Sidelying to Sit Transfers Sit to Stand: From bed;From chair/3-in-1;1: +1 Total assist Sit to Stand Details: Manual  facilitation for weight shifting;Manual facilitation for weight bearing  Trunk/Postural Assessment  Cervical Assessment Cervical Assessment: Within Functional Limits Thoracic Assessment Thoracic Assessment: Within Functional Limits Lumbar Assessment Lumbar Assessment: Within Functional Limits Postural Control Postural Control: Deficits on evaluation Righting Reactions: slow to respond  Balance Balance Balance Assessed: Yes Dynamic Sitting Balance Sitting balance - Comments: Sat EOB for several minutes and performed L LAQ w/ max assist w/ trunk posteriorly.  Pt unable to maintain balance despite verbal and tactile cues for hand placement and cues to lean anteriorly. Extremity/Trunk Assessment RUE Assessment RUE Assessment: Exceptions to [facility] RUE Tone RUE Tone: Flaccid (rotator cuff pain with Left worse than right) LUE Assessment LUE Assessment: Exceptions to WFL (hx of Rotator cuff pain with left worse than right) LUE AROM (degrees) Overall AROM Left Upper Extremity: Due to pain (hx of left rotator cuff pain and issues)  FIM:  FIM - Grooming Grooming Steps: Wash, rinse, dry face;Wash, rinse, dry hands Grooming: 3: Patient completes 2 of 4 or 3 of 5 steps FIM - Bathing Bathing Steps Patient Completed: Chest;Left Arm Bathing: 2: Max-Patient  completes 3-4 35f10 parts or 25-49% FIM - Upper Body Dressing/Undressing Upper body dressing/undressing: 1: Total-Patient completed less than 25% of tasks FIM - Lower Body Dressing/Undressing Lower body dressing/undressing: 1: Two helpers FIM - Toileting Toileting: 1: Two helpers FIM - BIT sales professionalTransfer: 1: Supine > Sit: Total A (helper does all/Pt. < 25%);1: Sit > Supine: Total A (helper does all/Pt. < 25%);1: Bed > Chair or W/C: Total A (helper does all/Pt. < 25%);1: Chair or W/C > Bed: Total A (helper does all/Pt. < 25%) FIM - TRadio producerDevices: Bedside commode Toilet Transfers: 1-Two helpers FIM - TCamera operatorTransfers: 0-Activity did not occur or was simulated   Refer to Care Plan for Long Term Goals  Recommendations for other services: Neuropsych  Discharge Criteria: Patient will be discharged from OT if patient refuses treatment 3 consecutive times without medical reason, if treatment goals not met, if there is a change in medical status, if patient makes no progress towards goals or if patient is discharged from hospital.  The above assessment, treatment plan, treatment alternatives and goals were discussed and mutually agreed upon: by family  ELisa Roca4/17/2016, 7:59 PM

## 2015-04-03 NOTE — Progress Notes (Signed)
  [place] PHYSICAL MEDICINE & REHABILITATION     PROGRESS NOTE    Subjective/Complaints: Had a good night. No new issues. Ate a little yesterday. Appears comfortable. ROS limited due to language. Husband at bedside  Objective: Vital Signs: Blood pressure 163/71, pulse 61, temperature 98.2 F (36.8 C), temperature source Oral, resp. rate 16, SpO2 98 %. No results found.  Recent Labs  04/02/15 1810  WBC 7.7  HGB 13.3  HCT 40.0  PLT 200    Recent Labs  04/02/15 1810  NA 138  K 4.1  CL 103  GLUCOSE 107*  BUN 20  CREATININE 0.59  CALCIUM 9.4   CBG (last 3)   Recent Labs  04/02/15 1613 04/02/15 2042 04/03/15 0716  GLUCAP 114* 121* 123*    Wt Readings from Last 3 Encounters:  02/15/15 45.813 kg (101 lb)  01/31/15 44.7 kg (98 lb 8.7 oz)  12/27/14 45.813 kg (101 lb)    Physical Exam:  Constitutional: She appears alert, no distress. HENT: oral mucosa moist/pink Head: Normocephalic and atraumatic.  Eyes: Conjunctivae are normal. Pupils are equal, round, and reactive to light.  Neck: Normal range of motion. Neck supple.  Cardiovascular: Normal rate and regular rhythm.  Respiratory: Effort normal and breath sounds normal. No respiratory distress. She has no wheezes.  GI: Soft. Bowel sounds are normal.  Musculoskeletal: She exhibits no edema or tenderness.  Neurological: She is alert. Aphasic/expressive. RUE: trace movement bicep, wrist in flexor synergy patter. RUE in extensor pattern--no volitional movement seen in leg. DTR's 3+. Right central 7 and tongue deviation. Slow to initiate movements of tongue. No withdraw to pain RUE and RLE.  M/S: left shoulder tender with active and PROM.  Psych: flat Skin: Skin is warm and dry.   Assessment/Plan: 1. 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 Physiatrist is providing close team supervision  and 24 hour management of active medical problems listed below. Physiatrist and rehab team continue to assess barriers to discharge/monitor patient progress toward functional and medical goals. FIM:                                  Medical Problem List and Plan: 1. 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 Thoracic aortic and supraceliac aortic thrombus/Anticoagulation: Pharmaceutical: Coumadin. To follow up with Dr. Bridgett Larsson in 6 month for repeat CTA chest/abd/pelvis.  3. Pain Management: Tylenol prn 4. Mood: Will start antidepressant to help with mood. Team to provide ego support. LCSW to follow for evaluation and support.  5. Neuropsych: This patient is capable of making decisions on his own behalf. 6. Skin/Wound Care: Routine pressure relief measures. Maintain adequate nutrition and hydration status.  7. Fluids/Electrolytes/Nutrition: Monitor I/O. Check lytes again tomorrow. Offer supplement prn po intake.   -encourage po fluids 8. HTN: Monitor BP every 8 hours. Continue Lopressor bid, and procardia XL. Avoid hypotension.  9. Dyslipidemia: On lovaza and Pravachol.  10 DM type 2: Hgb A1c- 6.9. Was on metformin  500mg  bid at home. will hold until po intake better.  - Monitor BS ac/hs and use SSI for elevated BS.  11. Osteoporosis: Continue Evista. Resume fosamax at discharge.    LOS (Days) 1 A FACE TO FACE EVALUATION WAS PERFORMED  Bobie Caris T 04/03/2015 8:50 AM

## 2015-04-03 NOTE — Plan of Care (Signed)
Problem: RH BOWEL ELIMINATION Goal: RH STG MANAGE BOWEL W/MEDICATION W/ASSISTANCE STG Manage Bowel with Medication independent[Husband able to ]  Outcome: Not Progressing Per report in acute floor last bowel movement 4/12

## 2015-04-03 NOTE — Progress Notes (Signed)
Nursing Note: Pt admitted last evening.Pt cva w/ R side flaccid.Recieved report that r side was immobile.Assessed pt to be immobile,flaccid on r arm and r  leg.Assesed chart and noted previous notes.A: Paged on-call and talked w/ Dr.Swartz who confirms that pt's R side has been flaccid as pt's Husband who was at the bedside also states that she has been flaccid.Pt resting quietly in bed.wbb

## 2015-04-04 ENCOUNTER — Inpatient Hospital Stay (HOSPITAL_COMMUNITY): Payer: Medicare HMO

## 2015-04-04 ENCOUNTER — Inpatient Hospital Stay (HOSPITAL_COMMUNITY): Payer: Medicare HMO | Admitting: Occupational Therapy

## 2015-04-04 ENCOUNTER — Inpatient Hospital Stay (HOSPITAL_COMMUNITY): Payer: Medicare HMO | Admitting: Speech Pathology

## 2015-04-04 ENCOUNTER — Encounter (HOSPITAL_COMMUNITY): Payer: Self-pay | Admitting: Cardiovascular Disease

## 2015-04-04 DIAGNOSIS — R1314 Dysphagia, pharyngoesophageal phase: Secondary | ICD-10-CM

## 2015-04-04 DIAGNOSIS — G8191 Hemiplegia, unspecified affecting right dominant side: Secondary | ICD-10-CM

## 2015-04-04 DIAGNOSIS — I69391 Dysphagia following cerebral infarction: Secondary | ICD-10-CM

## 2015-04-04 LAB — BASIC METABOLIC PANEL
ANION GAP: 13 (ref 5–15)
BUN: 19 mg/dL (ref 6–23)
CHLORIDE: 106 mmol/L (ref 96–112)
CO2: 21 mmol/L (ref 19–32)
Calcium: 9.1 mg/dL (ref 8.4–10.5)
Creatinine, Ser: 0.49 mg/dL — ABNORMAL LOW (ref 0.50–1.10)
GFR calc Af Amer: >90 mL/min (ref >90)
GFR calc non Af Amer: >90 mL/min (ref >90)
Glucose, Bld: 112 mg/dL — ABNORMAL HIGH (ref 70–99)
Potassium: 4 mmol/L (ref 3.5–5.1)
SODIUM: 140 mmol/L (ref 135–145)

## 2015-04-04 LAB — GLUCOSE, CAPILLARY
GLUCOSE-CAPILLARY: 118 mg/dL — AB (ref 70–99)
Glucose-Capillary: 108 mg/dL — ABNORMAL HIGH (ref 70–99)
Glucose-Capillary: 98 mg/dL (ref 70–99)

## 2015-04-04 LAB — PROTIME-INR
INR: 2.65 — AB (ref 0.00–1.49)
Prothrombin Time: 28.5 seconds — ABNORMAL HIGH (ref 11.6–15.2)

## 2015-04-04 MED ORDER — WARFARIN SODIUM 1 MG PO TABS
1.0000 mg | ORAL_TABLET | Freq: Once | ORAL | Status: AC
  Administered 2015-04-04: 1 mg via ORAL
  Filled 2015-04-04: qty 1

## 2015-04-04 MED ORDER — SODIUM CHLORIDE 0.45 % IV SOLN
INTRAVENOUS | Status: DC
  Administered 2015-04-04 – 2015-04-05 (×2): via INTRAVENOUS

## 2015-04-04 NOTE — Progress Notes (Signed)
Occupational Therapy Session Note  Patient Details  Name: Laura Melendez MRN: [id number] Date of Birth: 1941-07-16  Today's Date: 04/04/2015 OT Individual Time: 1400-1430 OT Individual Time Calculation (min): 30 min    Short Term Goals: Week 1:  OT Short Term Goal 1 (Week 1): Pt will feed self with LUE with moderate assist OT Short Term Goal 2 (Week 1): Pt. will sit EOB for 10 minutes with no LOB OT Short Term Goal 3 (Week 1): Pt will bathe UB/LB with mod assist OT Short Term Goal 4 (Week 1): Pt. will dress UB/:LB with mod assist OT Short Term Goal 5 (Week 1): Pt. will dress UB with mod assist  Skilled Therapeutic Interventions/Progress Updates:    Pt seen for OT session focusing on self- feeding, initiation to functional tasks, and cognitive retraining. Pt in supine upon arrival, agreeable to tx. Pt set-up with meal tray. Verbal and tactile cues provided for self-feeding with non-dominant hand. Pt able to self feed with significantly increased time, eating ~10% of meal and declining to eat anymore. Pt observed to be easily distracted by external environment, requiring cues to maintain attention to task. Pt required increased cuing to verbalize needs and communicate, however, was able to have simple conversation about family with therapist. Pt then completed table top task of sorting playing cards. Pt demonstrated ability to sort cards by color with 100% accuracy. She was asked to verbalize the number on each card prior to sorting, however, required max cues to initiate verbalization with each trial. At end of session, pt left in supine with all needs in reach and bed alarm on.   Therapy Documentation Precautions:  Precautions Precautions: Fall Restrictions Weight Bearing Restrictions: No Pain: Pain Assessment Pain Assessment: No/denies pain Pain Score: 0-No pain  See FIM for current functional status  Therapy/Group: Individual Therapy  Lewis, Pansey Pinheiro C 04/04/2015, 3:46 PM

## 2015-04-04 NOTE — Progress Notes (Signed)
[facility] PHYSICAL MEDICINE & REHABILITATION     PROGRESS NOTE    Subjective/Complaints: No difficulties overnight. Struggled with d3 and thins yesterday per RN ROS limited due to language. Husband at bedside  Objective: Vital Signs: Blood pressure 172/56, pulse 59, temperature 97.8 F (36.6 C), temperature source Oral, resp. rate 19, SpO2 95 %. No results found.  Recent Labs  04/02/15 1810  WBC 7.7  HGB 13.3  HCT 40.0  PLT 200    Recent Labs  04/02/15 1810  NA 138  K 4.1  CL 103  GLUCOSE 107*  BUN 20  CREATININE 0.59  CALCIUM 9.4   CBG (last 3)   Recent Labs  04/03/15 1630 04/03/15 2140 04/04/15 0657  GLUCAP 120* 133* 108*    Wt Readings from Last 3 Encounters:  02/15/15 45.813 kg (101 lb)  01/31/15 44.7 kg (98 lb 8.7 oz)  12/27/14 45.813 kg (101 lb)    Physical Exam:  Constitutional: She appears alert, no distress. HENT: oral mucosa moist/pink Head: Normocephalic and atraumatic.  Eyes: Conjunctivae are normal. Pupils are equal, round, and reactive to light.  Neck: Normal range of motion. Neck supple.  Cardiovascular: Normal rate and regular rhythm.  Respiratory: Effort normal and breath sounds normal. No respiratory distress. She has no wheezes.  GI: Soft. Bowel sounds are normal.  Musculoskeletal: She exhibits no edema or tenderness.  Neurological: She is alert. Aphasic/expressive. RUE: trace movement bicep, wrist in flexor synergy patter. RUE in extensor pattern--no volitional movement seen in leg. DTR's 3+. Right central 7 and tongue deviation. Slow to initiate movements of tongue. No withdraw to pain RUE and RLE.  M/S: left shoulder tender with active and PROM.  Psych: flat Skin: Skin is warm and dry.   Assessment/Plan: 1. 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 Physiatrist is providing close team supervision and 24  hour management of active medical problems listed below. Physiatrist and rehab team continue to assess barriers to discharge/monitor patient progress toward functional and medical goals. FIM: FIM - Bathing Bathing Steps Patient Completed: Chest, Left Arm Bathing: 2: Max-Patient completes 3-4 18f 10 parts or 25-49%  FIM - Upper Body Dressing/Undressing Upper body dressing/undressing: 1: Total-Patient completed less than 25% of tasks FIM - Lower Body Dressing/Undressing Lower body dressing/undressing: 1: Two helpers  FIM - Toileting Toileting: 1: Two helpers  FIM - Radio producer Devices: Recruitment consultant Transfers: 1-Two helpers  FIM - IT sales professional Transfer: 1: Supine > Sit: Total A (helper does all/Pt. < 25%), 1: Sit > Supine: Total A (helper does all/Pt. < 25%), 1: Bed > Chair or W/C: Total A (helper does all/Pt. < 25%), 1: Chair or W/C > Bed: Total A (helper does all/Pt. < 25%)  FIM - Locomotion: Wheelchair Locomotion: Wheelchair: 1: Total Assistance/staff pushes wheelchair (Pt<25%) FIM - Locomotion: Ambulation Locomotion: Ambulation Assistive Devices:  (rail on L) Ambulation/Gait Assistance: 1: +2 Total assist, 2: Max assist (+2 for w/c follow) Locomotion: Ambulation: 1: Two helpers  Comprehension Comprehension Mode: Auditory Comprehension: 3-Understands basic 50 - 74% of the time/requires cueing 25 - 50%  of the time  Expression Expression Mode: Verbal Expression: 1-Expresses basis less than 25% of the time/requires cueing greater than 75% of the time.  Social Interaction Social Interaction: 2-Interacts appropriately 25 - 49% of time - Needs frequent redirection.  Problem Solving Problem Solving: 1-Solves basic less than 25% of the  time - needs direction nearly all the time or does not effectively solve problems and may need a restraint for safety  Memory Memory: 2-Recognizes or recalls 25 - 49% of the time/requires  cueing 51 - 75% of the time  Medical Problem List and Plan: 1. 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 Thoracic aortic and supraceliac aortic thrombus/Anticoagulation: Pharmaceutical: Coumadin. To follow up with Dr. Bridgett Larsson in 6 month for repeat CTA chest/abd/pelvis.  3. Pain Management: Tylenol prn 4. Mood: Will start antidepressant to help with mood. Team to provide ego support. LCSW to follow for evaluation and support.  5. Neuropsych: This patient is capable of making decisions on his own behalf. 6. Skin/Wound Care: Routine pressure relief measures. Maintain adequate nutrition and hydration status.  7. Fluids/Electrolytes/Nutrition: down graded to D2/honeys yesterday after speaking with nurse.   -encourage po fluids as possible  -added HS IVF  -follow up labs today 8. HTN: Monitor BP every 8 hours. Continue Lopressor bid, and procardia XL. Avoid hypotension.  9. Dyslipidemia: On lovaza and Pravachol.  10 DM type 2: Hgb A1c- 6.9. Was on metformin  500mg  bid at home. will hold until po intake better.  - Monitor BS ac/hs and use SSI for elevated BS.  11. Osteoporosis: Continue Evista. Resume fosamax at discharge.  12. Dysphagia: swallowing evaluation per SLP.  -D1/honey for now  LOS (Days) 2 A FACE TO FACE EVALUATION WAS PERFORMED  Gio Janoski T 04/04/2015 7:01 AM

## 2015-04-04 NOTE — Progress Notes (Signed)
Patient information reviewed and entered into eRehab system by Quinlee Sciarra, RN, CRRN, PPS Coordinator.  Information including medical coding and functional independence measure will be reviewed and updated through discharge.     Per nursing patient was given "Data Collection Information Summary for Patients in Inpatient Rehabilitation Facilities with attached "Privacy Act Statement-Health Care Records" upon admission.  

## 2015-04-04 NOTE — Progress Notes (Signed)
ANTICOAGULATION CONSULT NOTE - Follow Up Consult  Pharmacy Consult for coumadin Indication: aortic clot  No Known Allergies  Patient Measurements:   Heparin Dosing Weight:   Vital Signs: Temp: 97.8 F (36.6 C) (04/18 0636) Temp Source: Oral (04/18 0636) BP: 172/56 mmHg (04/18 0636) Pulse Rate: 59 (04/18 0636)  Labs:  Recent Labs  04/02/15 0649 04/02/15 1810 04/03/15 1142 04/04/15 0620  HGB  --  13.3  --   --   HCT  --  40.0  --   --   PLT  --  200  --   --   LABPROT 14.6  --  20.2* 28.5*  INR 1.13  --  1.71* 2.65*  CREATININE  --  0.59  --  0.49*    Estimated Creatinine Clearance: 44.2 mL/min (by C-G formula based on Cr of 0.49).   Medications:  Scheduled:  . sodium chloride   Intravenous BID  . aspirin EC  325 mg Oral Daily  . calcium-vitamin D  1 tablet Oral BID WC  . enoxaparin (LOVENOX) injection  40 mg Subcutaneous Q24H  . FLUoxetine  10 mg Oral Daily  . insulin aspart  0-9 Units Subcutaneous TID WC  . lisinopril  2.5 mg Oral Daily  . metoprolol  50 mg Oral BID  . multivitamin with minerals  1 tablet Oral Daily  . NIFEdipine  60 mg Oral Daily  . omega-3 acid ethyl esters  1 g Oral BID  . pravastatin  40 mg Oral q1800  . raloxifene  60 mg Oral Daily  . 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  INR, however, jumped from 1.71 to 2.65.  Patient is on lovenox and aspirin.  Goal of Therapy:  INR 2-3 Monitor platelets by anticoagulation protocol: Yes   Plan:  - coumadin 1 mg po x1 - Daily INR - DC Lovenox and aspirin since INR is therapeutic  Laquana Villari, [name] 04/04/2015,8:21 AM

## 2015-04-04 NOTE — Progress Notes (Signed)
Social Work Assessment and Plan  Patient Details  Name: Laura Melendez MRN: [id number] Date of Birth: [birth year]  Today's Date: 04/04/2015  Problem List:  Patient Active Problem List   Diagnosis Date Noted  . 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 Right hemiparesis 04/04/2015  . 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 Aortic thrombus   . Stroke with cerebral ischemia   . CVA (cerebral infarction) 03/30/2015  . 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 Fever   . HCAP (healthcare-associated pneumonia) 01/29/2015  . Sepsis 01/29/2015  . DM (diabetes mellitus) 03/23/2014  . Osteoporosis, unspecified 03/23/2014  . Dyslipidemia 03/23/2014  . Tobacco abuse 03/23/2014  . Syncope 01/01/2014  . Essential hypertension, benign 01/01/2014   Past Medical History:  Past Medical History  Diagnosis Date  . Essential hypertension, benign   . Type 2 diabetes mellitus   . Osteoporosis 06/2008  . Stroke    Past Surgical History:  Past Surgical History  Procedure Laterality Date  . Ankle surgery Left 2004    otif  . Abdominal hysterectomy    . Anterior and posterior repair N/A 03/11/2013    Procedure: ANTERIOR (CYSTOCELE) AND POSTERIOR REPAIR (RECTOCELE);  Surgeon: Linda Hedges, DO;  Location: [facility];  Service: Gynecology;  Laterality: N/A;  with sacrospinous ligament fixation bilateral  . Colonoscopy  2009    negative  . Cataract extraction w/phaco Right 12/27/2014    Procedure: CATARACT EXTRACTION PHACO AND INTRAOCULAR LENS PLACEMENT RIGHT EYE;  Surgeon: Tonny Branch, MD;  Location: [facility];  Service: Ophthalmology;  Laterality: Right;  CDE:10.63  . Cataract extraction w/phaco Left 01/13/2015    Procedure: CATARACT EXTRACTION PHACO AND INTRAOCULAR LENS PLACEMENT LEFT EYE;  Surgeon: Tonny Branch, MD;  Location: [facility];  Service: Ophthalmology;  Laterality: Left;  CDE:18.87  . Cholecystectomy    . Tee without cardioversion N/A 04/01/2015    Procedure:  TRANSESOPHAGEAL ECHOCARDIOGRAM (TEE);  Surgeon: Sanda Klein, MD;  Location: [facility];  Service: Cardiovascular;  Laterality: N/A;   Social History:  reports that she quit smoking about 5 weeks ago. Her smoking use included Cigarettes. She has a 7.5 pack-year smoking history. She does not have any smokeless tobacco history on file. She reports that she does not drink alcohol or use illicit drugs.  Family / Support Systems Marital Status: Married How Long?: 41 years Patient Roles: Spouse Spouse/Significant Other: Shaylie Eklund - husband - [phone] (h);  [phone] (c) Children: April Peters - dtr - lives nearby - [phone] Anticipated Caregiver: husband and supportive family Ability/Limitations of Caregiver: no limitations Caregiver Availability: 24/7 Family Dynamics: close, supportive family  Social History Preferred language: English Religion: Baptist Read: Yes Write: Yes Employment Status: Retired Date Retired/Disabled/Unemployed: 2000 Age Retired: 89 Public relations account executive Issues: none reported Guardian/Conservator: N/A   Abuse/Neglect Physical Abuse: Denies Verbal Abuse: Denies Sexual Abuse: Denies Exploitation of patient/patient's resources: Denies Self-Neglect: Denies  Emotional Status Pt's affect, behavior and adjustment status: Pt was listening to [place] and husband talk, but did not initiate any conversation.  She would nod at times and was politely engaged, but did not fully participate.  Husband answered CSW's questions.  He feels she is more down than she normally would be, but feels it is at level you would expect given what she's going through.  He will let CSW know if he has concerns. Recent Psychosocial Issues: none reported Psychiatric History: none reported Substance Abuse History: none reported  Patient /  Family Perceptions, Expectations & Goals Pt/Family understanding of illness & functional limitations: Pt's husband seems to have  a good understanding of pt's condition and limitaitons. Premorbid pt/family roles/activities: Pt likes to clean her home and spend time with her family, especially her grandchildren. Anticipated changes in roles/activities/participation: Pt will need someone to assist with housecleaning initially upon returning home.   Pt/family expectations/goals: Pt's husband wants her to get stronger and be able to manage her care at home.  Community Resources [employer]: None Premorbid Home Care/DME Agencies: None (has a rolling [employer] at home from her ankle fracture recovery) Transportation available at discharge: husband Resource referrals recommended: Neuropsychology, Support group (specify)  Discharge Planning Living Arrangements: Spouse/significant other Support Systems: Spouse/significant other, Children, Other relatives, Friends/neighbors Type of Residence: Private residence Insurance Resources: Multimedia programmer (specify) Scientist, clinical (histocompatibility and immunogenetics) Medicare) Financial Resources: Radio broadcast assistant Screen Referred: No Money Management: Spouse Does the patient have any problems obtaining your medications?: No Home Management: Pt was taking care of the inside of their home.  Husband would run errands. Patient/Family Preliminary Plans: Pt's husband plans to take pt home and provide 24/7 care at d/c.  He will attend family education sessions with therapists. Barriers to Discharge: Steps Social Work Anticipated Follow Up Needs: HH/OP, Support Group Expected length of stay: 3 weeks  Clinical Impression CSW met with pt and her husband to introduce self and role of CSW, as well as to complete assessment.  Pt was quiet during visit and it was difficult to determine if she was processing CSW's questions/conversation.  She allowed husband to answer for her.  She was engaged and nodded at times in agreement.  Pt has good family support.  Husband plans to care for pt at home and their children are involved and  will support him as they are able.  He is retired and does not have any limitations.  CSW explained team conference and will update them afterwards.  No current questions/concerns/needs.  CSW will continue to follow and assist as needed.  Elis Sauber, Silvestre Mesi 04/04/2015, 1:09 PM

## 2015-04-04 NOTE — Progress Notes (Signed)
Meredith Staggers, MD Physician Signed Physical Medicine and Rehabilitation Consult Note 03/31/2015 8:32 AM  Related encounter: ED to Hosp-Admission (Discharged) from 03/30/2015 in [facility]        Physical Medicine and Rehabilitation Consult   Reason for Consult: Right sided weakness Referring Physician: Dr. Candiss Norse   HPI: Laura Melendez is a 74 y.o. RH-female with history of DM type 2, HTN, DDD lumbar spine, recent febrile illness; who was admitted on 03/30/15 with MS changes, incontinence and weakness RLE. 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 MRI brain done revealing subcortical punctate acute infarcts in the high, medial left frontal lobe and MRA without major intracranial occlusion. Dr. Aram Beecham consulted and stroke workup ongoing. Patient with resultant right hemiparesis with impairments in mobility. CIR recommended by MD in anticipation for extensive rehab needs.     Review of Systems  HENT: Negative for hearing loss.  Eyes: Negative for blurred vision and double vision.  Respiratory: Negative for cough and hemoptysis.  Cardiovascular: Negative for chest pain and palpitations.  Gastrointestinal: Negative for heartburn and nausea.  Genitourinary: Positive for urgency and frequency.  Musculoskeletal: Positive for joint pain (left shoulder with limited ROM).  Neurological: Positive for sensory change and focal weakness. Negative for headaches.  Psychiatric/Behavioral: The patient is nervous/anxious.      Past Medical History  Diagnosis Date  . Essential hypertension, benign   . Type 2 diabetes mellitus   . Osteoporosis 06/2008    Past Surgical History  Procedure Laterality Date  . Cholecystectomy    . Ankle surgery Left 2004    otif  . Abdominal hysterectomy    . Anterior and posterior repair N/A 03/11/2013    Procedure: ANTERIOR  (CYSTOCELE) AND POSTERIOR REPAIR (RECTOCELE); Surgeon: Linda Hedges, DO; Location: [facility]; Service: Gynecology; Laterality: N/A; with sacrospinous ligament fixation bilateral  . Colonoscopy  2009    negative  . Cataract extraction w/phaco Right 12/27/2014    Procedure: CATARACT EXTRACTION PHACO AND INTRAOCULAR LENS PLACEMENT RIGHT EYE; Surgeon: Tonny Branch, MD; Location: [facility]; Service: Ophthalmology; Laterality: Right; CDE:10.63  . Cataract extraction w/phaco Left 01/13/2015    Procedure: CATARACT EXTRACTION PHACO AND INTRAOCULAR LENS PLACEMENT LEFT EYE; Surgeon: Tonny Branch, MD; Location: [facility]; Service: Ophthalmology; Laterality: Left; CDE:18.87    Family History  Problem Relation Age of Onset  . Cancer Father     Liver  . Cancer Sister     Breast  . Diabetes Brother     Social History: Married--husband retired and supportive. Independent PTA without AD. 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 She reports that she quit smoking about 4 weeks ago. Her smoking use included Cigarettes. She has a 7.5 pack-year smoking history. She does not have any smokeless tobacco history on file. She reports that she does not drink alcohol or use illicit drugs.    Allergies: No Known Allergies    Medications Prior to Admission  Medication Sig Dispense Refill  . aspirin EC 81 MG tablet Take 81 mg by mouth daily.    . Calcium Carb-Cholecalciferol (CALCIUM 600/VITAMIN D3) 600-800 MG-UNIT TABS Take 1 tablet by mouth 2 (two) times daily with a meal.    . CINNAMON PO Take 1,000 mg by mouth daily.    Marland Kitchen CRANBERRY PO Take 84 mg by mouth daily.    Marland Kitchen lisinopril (PRINIVIL,ZESTRIL) 2.5 MG tablet Take 1 tablet (2.5 mg total) by  mouth daily. 90 tablet 3  . metFORMIN (GLUCOPHAGE) 500 MG tablet Take 1 tablet (500 mg total) by mouth 2 (two) times daily with a meal. 180 tablet 3  . metoprolol (LOPRESSOR)  50 MG tablet Take 1 tablet (50 mg total) by mouth 2 (two) times daily. 180 tablet 3  . Multiple Vitamin (MULTIVITAMIN WITH MINERALS) TABS Take 1 tablet by mouth daily.    . naproxen sodium (ANAPROX) 220 MG tablet Take 220 mg by mouth daily as needed (pain).    Marland Kitchen NIFEdipine (PROCARDIA XL/ADALAT-CC) 60 MG 24 hr tablet Take 1 tablet (60 mg total) by mouth daily. 90 tablet 3  . Omega-3 Fatty Acids (FISH OIL CONCENTRATE) 300 MG CAPS Take 1 capsule by mouth daily.    . pravastatin (PRAVACHOL) 20 MG tablet Take 1 tablet (20 mg total) by mouth daily. (Patient taking differently: Take 20 mg by mouth at bedtime. ) 30 tablet 2  . raloxifene (EVISTA) 60 MG tablet Take 1 tablet (60 mg total) by mouth daily. 90 tablet 3  . alendronate (FOSAMAX) 70 MG tablet Take 1 tablet (70 mg total) by mouth every 7 (seven) days. 12 tablet 3    Home:    Functional History:   Functional Status:  Mobility:          ADL:    Cognition: Cognition Orientation Level: Oriented to person, Oriented to place, Oriented to time, Disoriented to situation    Blood pressure 147/54, pulse 61, temperature 98.2 F (36.8 C), temperature source Oral, resp. rate 18, height 5\' 1"  (1.549 m), weight 45.36 kg (100 lb), SpO2 95 %. Physical Exam  Nursing note and vitals reviewed. Constitutional: She is oriented to person, place, and time. She appears well-developed and well-nourished.  Thin elderly female, distraught and labile throughout the exam.  HENT:  Head: Normocephalic and atraumatic.  Eyes: Conjunctivae are normal. Pupils are equal, round, and reactive to light.  Neck: Normal range of motion. Neck supple.  Cardiovascular: Normal rate and regular rhythm.  Respiratory: Effort normal and breath sounds normal.  GI: Soft. Bowel sounds are normal. She exhibits no distension. There is no tenderness.  Musculoskeletal: She exhibits no edema or tenderness.  Limited ROM left shoulder with  pain with active and PROM.  Neurological: She is alert and oriented to person, place, and time.  Soft voice but able to answer questions without difficulty. Inattention to RUE. Right hemiparesis with ataxic movements.  Skin: Skin is warm and dry.  Psychiatric: Her mood appears anxious.     Lab Results Last 24 Hours    Results for orders placed or performed during the hospital encounter of 03/30/15 (from the past 24 hour(s))  Ethanol Status: None   Collection Time: 03/30/15 12:12 PM  Result Value Ref Range   Alcohol, Ethyl (B) <5 0 - 9 mg/dL  Protime-INR Status: None   Collection Time: 03/30/15 12:12 PM  Result Value Ref Range   Prothrombin Time 13.6 11.6 - 15.2 seconds   INR 1.03 0.00 - 1.49  APTT Status: None   Collection Time: 03/30/15 12:12 PM  Result Value Ref Range   aPTT 30 24 - 37 seconds  CBC Status: None   Collection Time: 03/30/15 12:12 PM  Result Value Ref Range   WBC 6.2 4.0 - 10.5 K/uL   RBC 5.02 3.87 - 5.11 MIL/uL   Hemoglobin 13.4 12.0 - 15.0 g/dL   HCT 40.9 36.0 - 46.0 %   MCV 81.5 78.0 - 100.0 fL   MCH 26.7  26.0 - 34.0 pg   MCHC 32.8 30.0 - 36.0 g/dL   RDW 14.4 11.5 - 15.5 %   Platelets 204 150 - 400 K/uL  Differential Status: None   Collection Time: 03/30/15 12:12 PM  Result Value Ref Range   Neutrophils Relative % 58 43 - 77 %   Neutro Abs 3.6 1.7 - 7.7 K/uL   Lymphocytes Relative 33 12 - 46 %   Lymphs Abs 2.1 0.7 - 4.0 K/uL   Monocytes Relative 7 3 - 12 %   Monocytes Absolute 0.4 0.1 - 1.0 K/uL   Eosinophils Relative 1 0 - 5 %   Eosinophils Absolute 0.1 0.0 - 0.7 K/uL   Basophils Relative 1 0 - 1 %   Basophils Absolute 0.1 0.0 - 0.1 K/uL  Comprehensive metabolic panel Status: Abnormal   Collection Time: 03/30/15 12:12 PM  Result Value Ref Range   Sodium 142 135 - 145 mmol/L   Potassium  3.9 3.5 - 5.1 mmol/L   Chloride 105 96 - 112 mmol/L   CO2 26 19 - 32 mmol/L   Glucose, Bld 133 (H) 70 - 99 mg/dL   BUN 13 6 - 23 mg/dL   Creatinine, Ser 0.58 0.50 - 1.10 mg/dL   Calcium 9.7 8.4 - 10.5 mg/dL   Total Protein 7.4 6.0 - 8.3 g/dL   Albumin 4.0 3.5 - 5.2 g/dL   AST 22 0 - 37 U/L   ALT 16 0 - 35 U/L   Alkaline Phosphatase 40 39 - 117 U/L   Total Bilirubin 0.5 0.3 - 1.2 mg/dL   GFR calc non Af Amer 89 (L) >90 mL/min   GFR calc Af Amer >90 >90 mL/min   Anion gap 11 5 - 15  I-stat troponin, ED (not at [facility], [facility]) Status: None   Collection Time: 03/30/15 12:15 PM  Result Value Ref Range   Troponin i, poc 0.00 0.00 - 0.08 ng/mL   Comment 3     I-Stat Chem 8, ED Status: Abnormal   Collection Time: 03/30/15 12:17 PM  Result Value Ref Range   Sodium 142 135 - 145 mmol/L   Potassium 3.9 3.5 - 5.1 mmol/L   Chloride 105 96 - 112 mmol/L   BUN 15 6 - 23 mg/dL   Creatinine, Ser 0.60 0.50 - 1.10 mg/dL   Glucose, Bld 132 (H) 70 - 99 mg/dL   Calcium, Ion 1.12 (L) 1.13 - 1.30 mmol/L   TCO2 21 0 - 100 mmol/L   Hemoglobin 14.6 12.0 - 15.0 g/dL   HCT 43.0 36.0 - 46.0 %  Urine Drug Screen Status: None   Collection Time: 03/30/15 3:20 PM  Result Value Ref Range   Opiates NONE DETECTED NONE DETECTED   Cocaine NONE DETECTED NONE DETECTED   Benzodiazepines NONE DETECTED NONE DETECTED   Amphetamines NONE DETECTED NONE DETECTED   Tetrahydrocannabinol NONE DETECTED NONE DETECTED   Barbiturates NONE DETECTED NONE DETECTED  Urinalysis, Routine w reflex microscopic Status: Abnormal   Collection Time: 03/30/15 3:20 PM  Result Value Ref Range   Color, Urine YELLOW YELLOW   APPearance CLOUDY (A) CLEAR   Specific Gravity, Urine 1.015 1.005 - 1.030   pH 8.0 5.0 - 8.0   Glucose, UA NEGATIVE NEGATIVE  mg/dL   Hgb urine dipstick NEGATIVE NEGATIVE   Bilirubin Urine NEGATIVE NEGATIVE   Ketones, ur NEGATIVE NEGATIVE mg/dL   Protein, ur NEGATIVE NEGATIVE mg/dL   Urobilinogen, UA 0.2 0.0 - 1.0 mg/dL   Nitrite NEGATIVE NEGATIVE  Leukocytes, UA NEGATIVE NEGATIVE  CBG monitoring, ED Status: Abnormal   Collection Time: 03/30/15 4:46 PM  Result Value Ref Range   Glucose-Capillary 112 (H) 70 - 99 mg/dL  Glucose, capillary Status: Abnormal   Collection Time: 03/30/15 9:33 PM  Result Value Ref Range   Glucose-Capillary 124 (H) 70 - 99 mg/dL  Glucose, capillary Status: Abnormal   Collection Time: 03/31/15 7:36 AM  Result Value Ref Range   Glucose-Capillary 109 (H) 70 - 99 mg/dL      Imaging Results (Last 48 hours)    Ct Head Wo Contrast  03/30/2015 ADDENDUM REPORT: 03/30/2015 15:35 ADDENDUM: Critical Value/emergent results were called by telephone at the time of interpretation on 03/30/2015 at 3:35 pm to Dr. Aram Beecham, neurology , who verbally acknowledged these results. Electronically Signed By: Lowella Grip III M.D. On: 03/30/2015 15:35   03/30/2015 CLINICAL DATA: Acute onset right lower extremity weakness EXAM: CT HEAD WITHOUT CONTRAST TECHNIQUE: Contiguous axial images were obtained from the base of the skull through the vertex without intravenous contrast. COMPARISON: January 29, 2015 FINDINGS: There is mild diffuse atrophy. There is no intracranial mass hemorrhage, extra-axial fluid collection, or midline shift. There is evidence of a prior small infarct in the mid left cerebellum, stable. There is small vessel disease throughout the periventricular white matter bilaterally. There is a new area of decreased attenuation in the posterior right corona radiata which may represent an acute/recent small white matter infarct in this area. No other new gray-white compartment lesion identified. There is small  vessel disease in the anterior limb of the left internal capsule, stable. There is also a prior small infarct in the right thalamus, stable. Bony calvarium appears intact. The mastoid air cells are clear. The middle cerebral arteries have symmetric attenuation and appear stable bilaterally. IMPRESSION: Suspect new small white matter infarct in the right corona radiata. Extensive supratentorial/periventricular small vessel disease remains. Prior small infarct in the anterior limb of the left internal capsule as well as in the right thalamus and left cerebellum remains stable. No hemorrhage or mass effect. Electronically Signed: By: Lowella Grip III M.D. On: 03/30/2015 15:25   Ct Angio Neck W/cm &/or Wo/cm  03/31/2015 CLINICAL DATA: 74 year old female with small acute left frontal lobe infarcts diagnosed on MRI during evaluation of right lower extremity weakness. Initial encounter. EXAM: CT ANGIOGRAPHY NECK TECHNIQUE: Multidetector CT imaging of the neck was performed using the standard protocol during bolus administration of intravenous contrast. Multiplanar CT image reconstructions and MIPs were obtained to evaluate the vascular anatomy. Carotid stenosis measurements (when applicable) are obtained utilizing NASCET criteria, using the distal internal carotid diameter as the denominator. CONTRAST: 59mL OMNIPAQUE IOHEXOL 350 MG/ML SOLN COMPARISON: Brain MRI and intracranial MRA 03/30/2015, and earlier FINDINGS: Aortic arch: The aortic arch is not entirely included on these images. There is a bovine arch configuration. There is moderate to severe arch atherosclerosis including mural plaque or thrombus up to 8 mm in the distal arch. Right carotid system: No brachiocephalic or right CCA origin stenosis. Mildly tortuous right CCA with a partially retropharyngeal course. Soft and calcified plaque at the right carotid bifurcation but no right ICA origin stenosis. Tortuous right ICA with a partially  retropharyngeal course, otherwise negative to the skullbase. Visible right ICA siphon is patent with calcified plaque. Left carotid system: Calcified plaque at the left CCA origin resulting in up to 50 % stenosis with respect to the distal vessel (coronal image 144). Beyond its origin the left CCA is mildly tortuous. There  is calcified plaque at the left carotid bifurcation and involving the posterior left ICA bulb but not resulting in left ICA origin stenosis. Tortuous left ICA just beyond the bulb with a mildly kinked appearance. Otherwise negative to the skullbase. Visible left ICA siphon is patent with calcified plaque. Vertebral arteries: Calcified plaque at the right subclavian artery origin resulting in stenosis of less than 50 % with respect to the distal vessel . Calcified plaque at the right vertebral artery origin with mild stenosis. Otherwise negative right vertebral artery to the vertebrobasilar junction. Normal right PICA origin. Calcified plaque in the left subclavian artery origin with stenosis up to 60 % with respect to the distal vessel. Mild calcified plaque at the left vertebral artery origin with mild stenosis. The proximal left vertebral artery is tortuous. The vertebral arteries are codominant. The left is otherwise negative to the vertebrobasilar junction. Dominant appearing left AICA. Skeleton: Largely absent dentition. Degenerative changes in the cervical spine. Thoracic scoliosis. No acute osseous abnormality identified. Mild bubbly opacity in the right posterior ethmoid air cells. Other Visualized paranasal sinuses and mastoids are clear. Other neck: Thyromegaly with bilateral less than 15 mm hypodense thyroid nodules. Larynx, pharynx, parapharyngeal spaces and sublingual space are within normal limits. Negative retropharyngeal space aside from a retropharyngeal course of the carotids, more so the right. Submandibular and parotid glands are within normal limits. Negative orbits soft  tissues. No cervical lymphadenopathy. Dependent atelectasis in the left upper lobe. No superior mediastinal lymphadenopathy. IMPRESSION: 1. 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 No other Hemodynamically significant stenosis of the carotid or vertebral arteries in the neck. 2. 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 Electronically Signed By: Genevie Ann M.D. On: 03/31/2015 07:21   Mr Jodene Nam Head Wo Contrast  03/30/2015 CLINICAL DATA: Right leg weakness. EXAM: MRI HEAD WITHOUT CONTRAST MRA HEAD WITHOUT CONTRAST TECHNIQUE: Multiplanar, multiecho pulse sequences of the brain and surrounding structures were obtained without intravenous contrast. Angiographic images of the head were obtained using MRA technique without contrast. COMPARISON: Head CT 03/30/2015 and MRI 06/21/2005 FINDINGS: MRI HEAD FINDINGS There are several punctate acute infarcts in the high, medial left frontal lobe which are predominantly subcortical in location. There is no evidence of intracranial hemorrhage, mass, midline shift, or extra-axial fluid collection. There is mild generalized cerebral atrophy. Patchy and confluent T2 hyperintensities in the periventricular greater than subcortical cerebral white matter have increased from the prior MRI and are nonspecific but compatible with moderate chronic small vessel ischemic disease. Chronic lacunar infarcts are noted in the left corona radiata and right greater than left thalami. A small, chronic left cerebellar infarct is also noted. Prior bilateral cataract extraction is noted. Paranasal sinuses mastoid air cells are clear. Major intracranial vascular flow voids are preserved. MRA HEAD FINDINGS Images are mildly to moderately motion degraded. Visualized distal vertebral arteries are patent and codominant without stenosis. Right PICA origin is patent. Left PICA is not  identified. AICA and SCA origins are patent. Basilar artery is patent without stenosis. There is a fetal origin of the left PCA. There is moderate right P1 segment stenosis. Internal carotid arteries are patent from skullbase to carotid termini without evidence of significant stenosis. M1 and A1 segments are patent without stenosis. A2 segments and MCA branch vessels are suboptimally evaluated due to motion artifact but appear patent without significant proximal stenosis identified. No intracranial aneurysm is  identified. IMPRESSION: 1. Punctate acute left frontal lobe infarcts. 2. Moderate chronic small vessel ischemic disease and chronic lacunar infarcts as above. 3. No major intracranial arterial occlusion or significant proximal anterior circulation stenosis. Moderate proximal right PCA stenosis. Electronically Signed By: Logan Bores On: 03/30/2015 16:52   Mr Brain Wo Contrast  03/30/2015 CLINICAL DATA: Right leg weakness. EXAM: MRI HEAD WITHOUT CONTRAST MRA HEAD WITHOUT CONTRAST TECHNIQUE: Multiplanar, multiecho pulse sequences of the brain and surrounding structures were obtained without intravenous contrast. Angiographic images of the head were obtained using MRA technique without contrast. COMPARISON: Head CT 03/30/2015 and MRI 06/21/2005 FINDINGS: MRI HEAD FINDINGS There are several punctate acute infarcts in the high, medial left frontal lobe which are predominantly subcortical in location. There is no evidence of intracranial hemorrhage, mass, midline shift, or extra-axial fluid collection. There is mild generalized cerebral atrophy. Patchy and confluent T2 hyperintensities in the periventricular greater than subcortical cerebral white matter have increased from the prior MRI and are nonspecific but compatible with moderate chronic small vessel ischemic disease. Chronic lacunar infarcts are noted in the left corona radiata and right greater than left thalami. A small, chronic left  cerebellar infarct is also noted. Prior bilateral cataract extraction is noted. Paranasal sinuses mastoid air cells are clear. Major intracranial vascular flow voids are preserved. MRA HEAD FINDINGS Images are mildly to moderately motion degraded. Visualized distal vertebral arteries are patent and codominant without stenosis. Right PICA origin is patent. Left PICA is not identified. AICA and SCA origins are patent. Basilar artery is patent without stenosis. There is a fetal origin of the left PCA. There is moderate right P1 segment stenosis. Internal carotid arteries are patent from skullbase to carotid termini without evidence of significant stenosis. M1 and A1 segments are patent without stenosis. A2 segments and MCA branch vessels are suboptimally evaluated due to motion artifact but appear patent without significant proximal stenosis identified. No intracranial aneurysm is identified. IMPRESSION: 1. Punctate acute left frontal lobe infarcts. 2. Moderate chronic small vessel ischemic disease and chronic lacunar infarcts as above. 3. No major intracranial arterial occlusion or significant proximal anterior circulation stenosis. Moderate proximal right PCA stenosis. Electronically Signed By: Logan Bores On: 03/30/2015 16:52     Assessment/Plan: Diagnosis: left frontal infarct 1. Does the need for close, 24 hr/day medical supervision in concert with the patient's rehab needs make it unreasonable for this patient to be served in a less intensive setting? Yes 2. Co-Morbidities requiring supervision/potential complications: htn, dm,  3. Due to bladder management, bowel management, safety, skin/wound care, disease management, medication administration, pain management and patient education, does the patient require 24 hr/day rehab nursing? Yes 4. Does the patient require coordinated care of a physician, rehab nurse, PT (1-2 hrs/day, 5 days/week), OT (1-2 hrs/day, 5 days/week) and SLP (1-2 hrs/day,  5 days/week) to address physical and functional deficits in the context of the above medical diagnosis(es)? Yes Addressing deficits in the following areas: balance, endurance, locomotion, strength, transferring, bowel/bladder control, bathing, dressing, feeding, grooming, toileting, cognition and psychosocial support 5. Can the patient actively participate in an intensive therapy program of at least 3 hrs of therapy per day at least 5 days per week? Yes 6. The potential for patient to make measurable gains while on inpatient rehab is good 7. Anticipated functional outcomes upon discharge from inpatient rehab are supervision and min assist with PT, supervision and min assist with OT, modified independent, supervision and min assist with SLP. 8. Estimated rehab length of stay to  reach the above functional goals is: potentially 10-20 days 9. Does the patient have adequate social supports and living environment to accommodate these discharge functional goals? Yes 10. Anticipated D/C setting: Home 11. Anticipated post D/C treatments: HH therapy and Outpatient therapy 12. Overall Rehab/Functional Prognosis: excellent  RECOMMENDATIONS: This patient's condition is appropriate for continued rehabilitative care in the following setting: CIR Patient has agreed to participate in recommended program. Yes Note that insurance prior authorization may be required for reimbursement for recommended care.  Comment: We are still early in admission, but given neuro deficits, it is likely the patient would benefit from an inpatient rehab stay. Rehab Admissions Coordinator to follow up.  Thanks,  Meredith Staggers, MD, [facility]     03/31/2015       Revision History     Date/Time User Provider Type Action   03/31/2015 11:06 AM Meredith Staggers, MD Physician Sign   03/31/2015 9:13 AM Bary Leriche, PA-C Physician Assistant Share   View Details Report       Routing History     Date/Time From To Method     03/31/2015 11:06 AM Meredith Staggers, MD Meredith Staggers, MD In Basket   03/31/2015 11:06 AM Meredith Staggers, MD [name], MD In Basket

## 2015-04-04 NOTE — Progress Notes (Signed)
[facility] Individual Statement of Services  Patient Name:  LIBBI TOWNER  Date:  04/04/2015  Welcome to the [facility].  Our goal is to provide you with an individualized program based on your diagnosis and situation, designed to meet your specific needs.  With this comprehensive rehabilitation program, you will be expected to participate in at least 3 hours of rehabilitation therapies Monday-Friday, with modified therapy programming on the weekends.  Your rehabilitation program will include the following services:  Physical Therapy (PT), Occupational Therapy (OT), Speech Therapy (ST), 24 hour per day rehabilitation nursing, Neuropsychology, Case Management (Social Worker), Rehabilitation Medicine, Nutrition Services and Pharmacy Services  Weekly team conferences will be held on Tuesdays to discuss your progress.  Your Social Worker will talk with you frequently to get your input and to update you on team discussions.  Team conferences with you and your family in attendance may also be held.  Expected length of stay:  3 weeks  Overall anticipated outcome:  Minimal Assistance  Depending on your progress and recovery, your program may change. Your Social Worker will coordinate services and will keep you informed of any changes. Your Social Worker's name and contact numbers are listed  below.  The following services may also be recommended but are not provided by the [facility] will be made to provide these services after discharge if needed.  Arrangements include referral to agencies that provide these services.  Your insurance has been verified to be:  [employer] Your primary doctor is:  Dr. [name]  Pertinent information will be shared with your doctor and your insurance company.  Social Worker:  Alfonse Alpers, LCSW  [phone] or [phone]  Information discussed with and copy given to patient by: Trey Sailors, 04/04/2015, 1:14 PM

## 2015-04-04 NOTE — Progress Notes (Signed)
[facility] Admission Coordinator Signed Physical Medicine and Rehabilitation PMR Pre-admission 03/31/2015 3:51 PM  Related encounter: ED to Hosp-Admission (Discharged) from 03/30/2015 in [facility]   PMR Admission Coordinator Pre-Admission Assessment  Patient: Laura Melendez is an 74 y.o., female MRN: [id number] DOB: [birth year] Height: 5\' 1"  (154.9 cm) Weight: 45.36 kg (100 lb)  Insurance Information HMO: PPO: yes PCP: IPA: 80/20: OTHER:  PRIMARY: [employer] Medicare HMO Policy#: [id number] Subscriber: self CM Name: Su Hoff, RN Phone#: [phone], ext. [phone] Fax#: [phone] Approval given on 04-01-15 and pt was admitted to [facility] on 04-02-15 due to medical issues. Authorization given for seven days from 04-02-15 through 04-08-15 with updates due to [facility] on 04-07-15 to above fax.  Pre-Cert#: [id number] Employer: retired Benefits: Phone #: [phone] Name: automated Eff. Date: 12-17-14 Deduct: $1288 Out of Pocket Max: none Life Max: unlimited CIR: 100%, pre-auth needed SNF: 100% days 1-20; 80% days 21-100 (100 days visit limit) Outpatient: 80% Co-Pay: 20% Home Health: 100% Co-Pay: none DME: 80% Co-Pay: 20% Providers: in network  Emergency Contact Information Contact Information    Name Relation Home Work Mobile   [place] Spouse [phone]  [phone]   Peters,April Daughter   [phone]     Current Medical History  Patient Admitting Diagnosis: left frontal infarct  History of Present Illness: Laura Melendez is a 74 y.o. RH-female with history of DM type 2, HTN, DDD lumbar spine, recent febrile illness; who was admitted on 03/30/15 with MS  changes, incontinence and weakness RLE. 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 MRI brain done revealing subcortical punctate acute infarcts in the high, medial left frontal lobe and MRA without major intracranial occlusion. Dr. Aram Beecham consulted and stroke workup ongoing. Patient with resultant right hemiparesis with impairments in mobility. CIR recommended by MD in anticipation for extensive rehab needs.  NIH Total: 9  Past Medical History  Past Medical History  Diagnosis Date  . Essential hypertension, benign   . Type 2 diabetes mellitus   . Osteoporosis 06/2008  . Stroke     Family History  family history includes Cancer in her father and sister; Diabetes in her brother.  Prior Rehab/Hospitalizations: pt had L ankle fx and received outpt therapy.  Current Medications   Current facility-administered medications:  . stroke: mapping our early stages of recovery book, , Does not apply, Once, Samella Parr, NP . 0.9 % sodium chloride infusion, , Intravenous, Continuous, Mihai Croitoru, MD, Last Rate: 20 mL/hr at 04/01/15 1246, 500 mL at 04/01/15 1246 . aspirin EC tablet 325 mg, 325 mg, Oral, Daily, Nita Sells, MD, 325 mg at 04/01/15 0923 . calcium-vitamin D (OSCAL WITH D) 500-200 MG-UNIT per tablet 1 tablet, 1 tablet, Oral, BID WC, Samella Parr, NP, 1 tablet at 04/01/15 [phone] . coumadin book, , Does not apply, Once, JASKIRAN PATA, RPH . heparin injection 5,000 Units, 5,000 Units, Subcutaneous, 3 times per day, Samella Parr, NP, 5,000 Units at 04/01/15 1434 . insulin aspart (novoLOG) injection 0-9 Units, 0-9 Units, Subcutaneous, TID WC, Jeryl Columbia, NP, 1 Units at 04/01/15 0631 . lisinopril (PRINIVIL,ZESTRIL) tablet 2.5 mg, 2.5 mg, Oral, Daily, Samella Parr, NP, 2.5 mg at 04/01/15 0924 . metoprolol (LOPRESSOR) tablet 50 mg, 50 mg, Oral, BID, Samella Parr, NP, 50 mg at 04/01/15 1607 .  multivitamin with minerals tablet 1 tablet, 1 tablet, Oral, Daily, Samella Parr, NP, 1  tablet at 04/01/15 0924 . NIFEdipine (PROCARDIA-XL/ADALAT CC) 24 hr tablet 60 mg, 60 mg, Oral, Daily, Samella Parr, NP, 60 mg at 04/01/15 0923 . omega-3 acid ethyl esters (LOVAZA) capsule 1 g, 1 g, Oral, BID, Samella Parr, NP, 1 g at 04/01/15 3329 . pravastatin (PRAVACHOL) tablet 40 mg, 40 mg, Oral, q1800, Thurnell Lose, MD, 40 mg at 03/31/15 1752 . raloxifene (EVISTA) tablet 60 mg, 60 mg, Oral, Daily, Samella Parr, NP, 60 mg at 04/01/15 1204 . senna-docusate (Senokot-S) tablet 1 tablet, 1 tablet, Oral, QHS PRN, Samella Parr, NP . warfarin (COUMADIN) tablet 5 mg, 5 mg, Oral, ONCE-1800, Sherlene L Rumbarger, RPH . warfarin (COUMADIN) video, , Does not apply, Once, EARNIE ROCKHOLD, RPH . Warfarin - Pharmacist Dosing Inpatient, , Does not apply, q1800, Para March, RPH  Patients Current Diet: Diet NPO time specified for TEE  Precautions / Restrictions Precautions Precautions: Fall Restrictions Weight Bearing Restrictions: No   Prior Activity Level Limited Community (1-2x/wk): Pt got out on limited errands and mainly only drove to the grocery store. Her husband does most of the driving and she accompanies him on some errands. She enjoys her soap operas (Young and the Restless) and enjoys cleaning the house and being with her grandkids.   Home Assistive Devices / Equipment Home Assistive Devices/Equipment: None Home Equipment: Beach - 2 wheels, Grab bars - toilet, Grab bars - tub/shower  Prior Functional Level Prior Function Level of Independence: Independent Comments: Still drives, Independent  Current Functional Level Cognition  Arousal/Alertness: Awake/alert Overall Cognitive Status: Impaired/Different from baseline Current Attention Level: Sustained Orientation Level: Oriented to person Following Commands: Follows one step commands with increased time, Follows  one step commands inconsistently Safety/Judgement: Decreased awareness of safety, Decreased awareness of deficits General Comments: Pt requires increased time to initiate movement and does not follow all commands Attention: Sustained Sustained Attention: Appears intact Awareness: Impaired Awareness Impairment: Intellectual impairment, Emergent impairment, Anticipatory impairment Problem Solving: (intact for basic) Safety/Judgment: (to be further determined)   Extremity Assessment (includes Sensation/Coordination)  Upper Extremity Assessment: Defer to OT evaluation RUE Deficits / Details: Pt with minimal ability to activate movement all muscle groups, but unable to sustain. Able to perform ~80* elbow flexion with increased time. Appears to have motor planning deficits  RUE Coordination: decreased fine motor, decreased gross motor LUE Deficits / Details: Pt reports pain at times Lt shoulder. AROM WFL, but delayed responses when asking her to move   Lower Extremity Assessment: RLE deficits/detail RLE Deficits / Details: Limited assessment due to apraxia and decreased ability to follow commands. Rt knee extension 3/5 with MMT, but held weight against gravity functionally. RLE: (poor cognition/apraxia)    ADLs  Overall ADL's : Needs assistance/impaired Eating/Feeding: Sitting, Bed level Grooming: Wash/dry hands, Wash/dry face, Oral care, Brushing hair, Minimal assistance, Sitting Upper Body Bathing: Moderate assistance, Sitting Lower Body Bathing: Maximal assistance, Sit to/from stand Upper Body Dressing : Maximal assistance, Sitting Lower Body Dressing: Total assistance, Sit to/from stand Toilet Transfer: Moderate assistance, Stand-pivot, BSC Toileting- Clothing Manipulation and Hygiene: Total assistance, Sit to/from stand Functional mobility during ADLs: Moderate assistance General ADL Comments: Pt incontinent of urine. Assisted her with peri care    Mobility  Overal  bed mobility: Needs Assistance, +2 for physical assistance Bed Mobility: Sidelying to Sit, Rolling Rolling: Mod assist, +2 for physical assistance Sidelying to sit: Max assist, +2 for physical assistance, HOB elevated Supine to sit: Max assist, HOB elevated Sit to supine: Max  assist General bed mobility comments: Max assist for sequencing and tactile cues for hand placement. Pt requires assist using bed pad to roll to R side and max assist w/ BLEs and trunk to sit upright EOB.    Transfers  Overall transfer level: Needs assistance Equipment used: None Transfers: Sit to/from Stand, Stand Pivot Transfers Sit to Stand: Max assist, +2 safety/equipment Stand pivot transfers: Max assist, +2 safety/equipment Squat pivot transfers: Mod assist General transfer comment: Max assist w/ R knee block for sit>stand and stand pivot. Pt leaning posteriorly sitting EOB and standing. No evidence of RLE muscle recruitment thorughout session but shuffles LLE during stand pivot. Max cues for hand placement for safety.    Ambulation / Gait / Stairs / Wheelchair Mobility  Ambulation/Gait General Gait Details: Pivotal shuffle w/ max assist only    Posture / Balance Dynamic Sitting Balance Sitting balance - Comments: Sat EOB for several minutes and performed L LAQ w/ max assist w/ trunk posteriorly. Pt unable to maintain balance despite verbal and tactile cues for hand placement and cues to lean anteriorly. Balance Overall balance assessment: Needs assistance Sitting-balance support: Bilateral upper extremity supported, Feet supported Sitting balance-Leahy Scale: Zero Sitting balance - Comments: Sat EOB for several minutes and performed L LAQ w/ max assist w/ trunk posteriorly. Pt unable to maintain balance despite verbal and tactile cues for hand placement and cues to lean anteriorly. Postural control: Posterior lean, Right lateral lean Standing balance support: Bilateral upper extremity  supported, During functional activity Standing balance-Leahy Scale: Zero Standing balance comment: Posterior lean and requires max assist to maintain upright    Special needs/care consideration BiPAP/CPAP no  CPM no  Continuous Drip IV no  Dialysis no  Life Vest no  Oxygen no  Special Bed no Trach Size no Wound Vac (area) no  Skin - no current skin issues  Bowel mgmt: last BM on 03-29-15 Bladder mgmt: urinary incontinence Diabetic mgmt - yes, managed at home with medications Pt has been emotionally labile.   Previous Home Environment Living Arrangements: Spouse/significant other Lives With: Spouse Available Help at Discharge: Family Type of Home: House Home Layout: One level Home Access: Stairs to enter Entrance Stairs-Rails: Left, Right Entrance Stairs-Number of Steps: 5 Bathroom Shower/Tub: Multimedia programmer: Handicapped height Irena: No  Discharge Living Setting Plans for Discharge Living Setting: Patient's home Type of Home at Discharge: House Discharge Home Layout: One level Discharge Home Access: Stairs to enter Entrance Stairs-Rails: Right, Left Entrance Stairs-Number of Steps: 2 Discharge Bathroom Shower/Tub: Walk-in shower Discharge Bathroom Toilet: Handicapped height Does the patient have any problems obtaining your medications?: No  Social/Family/Support Systems Patient Roles: Spouse Contact Information: husband and dtr April are primary contacts Anticipated Caregiver: husband and supportive family Anticipated Ambulance person Information: see above Ability/Limitations of Caregiver: no limitations Caregiver Availability: 24/7 Discharge Plan Discussed with Primary Caregiver: Yes (discussed with husband, dtr and other family) Is Caregiver In Agreement with Plan?: Yes Does Caregiver/Family have Issues with Lodging/Transportation while Pt is in Rehab?: No (husband may spend the  night w/pt)  Goals/Additional Needs Patient/Family Goal for Rehab: Supervision to [place] assist with PT/OT; Mod ind to supervision to [place] assist with SLP Expected length of stay: 10-20 days Cultural Considerations: [facility] Dietary Needs: currently NPO for TEE Equipment Needs: to be determined Pt/Family Agrees to Admission and willing to participate: Yes Program Orientation Provided & Reviewed with Pt/Caregiver Including Roles & Responsibilities: Yes   Decrease burden of Care through IP rehab admission:  NA   Possible need for SNF placement upon discharge: not anticipated   Patient Condition: This patient's medical and functional status has changed since the consult dated: 03-31-15 in which the Rehabilitation Physician determined and documented that the patient's condition is appropriate for intensive rehabilitative care in an inpatient rehabilitation facility. See "History of Present Illness" (above) for medical update. Functional changes are: max assist x 2 for limited stand pivot transfers and moderate to maximal assistance with self care skills. Patient's medical and functional status update has been discussed with the Rehabilitation physician and patient remains appropriate for inpatient rehabilitation. Will admit to inpatient rehab tomorrow, Saturday 04-02-15.   Preadmission Screen Completed By: Nanetta Batty, PT, 04/01/2015 3:19 PM ______________________________________________________________________  Discussed status with Dr. Naaman Plummer on 04-01-15 at 1520 and received telephone approval for admission tomorrow.  Admission Coordinator: Nanetta Batty PT, time 1520/Date 04-01-15          Cosigned by: Meredith Staggers, MD at 04/01/2015 3:29 PM  Revision History     Date/Time User Provider Type Action   04/01/2015 3:29 PM Meredith Staggers, MD Physician Cosign   04/01/2015 3:27 PM [facility] Admission Coordinator Sign

## 2015-04-04 NOTE — IPOC Note (Addendum)
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   Right hemiparesis     Functional Problem List: Nursing Bladder, Bowel, Endurance, Medication Management, Nutrition, Perception, Safety, Motor  PT Balance, Behavior, Endurance, Motor, Perception, Pain, Safety, Sensory  OT Balance, Cognition, Edema, Endurance, Motor, Pain, Perception, Safety  SLP    TR Activity tolerance, functional mobility, balance, cognition, safety, pain, anxiety/stress        Basic ADL's: OT Eating, Grooming, Bathing, Dressing, Toileting     Advanced  ADL's: OT Simple Meal Preparation, Laundry, Light Housekeeping     Transfers: PT Bed Mobility, Bed to Chair, Car, Manufacturing systems engineer, Metallurgist: PT Ambulation, Emergency planning/management officer, Stairs     Additional Impairments: OT Fuctional Use of Upper Extremity  SLP        TR      Anticipated Outcomes Item Anticipated Outcome  Self Feeding SBA   Swallowing      Basic self-care  minimal assist  Toileting  minimal assist   Bathroom Transfers minimal assist  Bowel/Bladder  decresed incont.,regular BM Q1-2 days,managed by caregiver by d/c.  Transfers  MinA  Locomotion  S from w/c level, MinA short distance ambulation  Communication     Cognition     Pain     Safety/Judgment  no injury,safe mobility,d/c to safe environment by d/c.,improved safety awareness   Therapy Plan: PT Intensity: Minimum of 1-2 x/day ,45 to 90 minutes PT Frequency: 5 out of 7 days PT Duration Estimated Length of Stay: 3 weeks OT Intensity: Minimum of 1-2 x/day, 45 to 90 minutes OT Frequency: 5 out of 7 days OT Duration/Estimated Length of Stay: 3 weeks  TR Duration/ELOS:  3 weeks TR Frequency:   Min 1 time per week >20 minutes      Team Interventions: Nursing Interventions Patient/Family Education, Bladder Management, Bowel Management, Medication Management, Disease Management/Prevention, Psychosocial Support, Skin Care/Wound Management, Discharge Planning  PT interventions Ambulation/gait training, Cognitive remediation/compensation, Balance/vestibular training, Discharge planning, DME/adaptive equipment instruction, Functional electrical stimulation, Disease management/prevention, Neuromuscular re-education, Functional mobility training, Pain management, Patient/family education, Skin care/wound management, Psychosocial support, Splinting/orthotics, Stair training, Therapeutic Exercise, Therapeutic Activities, UE/LE Strength taining/ROM, UE/LE Coordination activities, Wheelchair propulsion/positioning, Visual/perceptual remediation/compensation  OT Interventions Balance/vestibular training, Cognitive remediation/compensation, Discharge planning, DME/adaptive equipment instruction, Functional electrical stimulation, Functional mobility training, Neuromuscular re-education, Pain management, Patient/family education, Self Care/advanced ADL retraining, Splinting/orthotics, Therapeutic Activities, Therapeutic Exercise, UE/LE Strength taining/ROM, UE/LE Coordination activities, Wheelchair propulsion/positioning  SLP Interventions    TR Interventions Recreation/leisure participation, Balance/Vestibular training, functional mobility, therapeutic activities, UE/LE strength/coordination, w/c mobility, cognitive retraining/compensation, community reintegration, pt/family education, adaptive equipment instruction/use, discharge planning, psychosocial support   SW/CM Interventions Discharge Planning, Psychosocial Support, Patient/Family Education    Team Discharge Planning: Destination: PT-Home (vs SNF, pending progress) ,OT- Home , SLP-  Projected Follow-up: PT-Home health PT, Skilled nursing facility  (pending progress), OT-  Home health OT, SLP-  Projected Equipment Needs: PT-Wheelchair cushion (measurements), Wheelchair (measurements), Rolling Grieser with 5" wheels, OT- Tub/shower bench, 3 in 1 bedside comode, SLP-  Equipment Details: PT- , OT-  Patient/family involved in discharge planning: PT- Family member/caregiver,  OT-Family member/caregiver, Patient, SLP-   MD ELOS: 21 days Medical Rehab Prognosis:  Excellent Assessment: The patient has been admitted for  CIR therapies with the diagnosis of left MCA infarct. The team will be addressing functional mobility, strength, stamina, balance, safety, adaptive techniques and equipment, self-care, bowel and bladder mgt, patient and caregiver education, NMR, swallowing, language, cognition, communication. Goals have been set at min assist for basic mobility and self-care, and min to mod + assist with swallowing, language. Husband is very supportive.    Meredith Staggers, MD, FAAPMR      See Team Conference Notes for weekly updates to the plan of care

## 2015-04-04 NOTE — Progress Notes (Signed)
Occupational Therapy Session Note  Patient Details  Name: Laura Melendez MRN: [id number] Date of Birth: 1941/12/15  Today's Date: 04/04/2015 OT Individual Time: 1100-1200 OT Individual Time Calculation (min): 60 min   Short Term Goals: Week 1:  OT Short Term Goal 1 (Week 1): Pt will feed self with LUE with moderate assist OT Short Term Goal 2 (Week 1): Pt. will sit EOB for 10 minutes with no LOB OT Short Term Goal 3 (Week 1): Pt will bathe UB/LB with mod assist OT Short Term Goal 4 (Week 1): Pt. will dress UB/:LB with mod assist OT Short Term Goal 5 (Week 1): Pt. will dress UB with mod assist  Skilled Therapeutic Interventions/Progress Updates: ADL-retraining with focus on transfers at shower level, adapted bathing/dressing skills, dynamic sitting balance, and family ed with husband present.   Pt received seated in w/c, tearful but receptive for shower level bathing to wash her hair.   Pt minimally expressive but able to follow 1-step commands consistently.   With facilitation to weight-shift and hand guidance for placement of hands and feet, pt completed SPT transfer from w/c to tub bench with overall max A x1 (pt = 50% of lift and 70% of lower).   Pt remained seated for showering and attempted use of wash mit as directed during bathing, with intermittent mod assist to maintain postural control d/t right lateral leaning.   Pt returned to w/c and dressed at sink side (see FIM for details).    Pt's husband maintained engagement with assisting pt throughout task with min vc required to inhibit his assistance to allow pt to attempt task.   Pt was returned to bed at end of session with overall max assist X1 to transfer and bring both legs into bed.     Therapy Documentation Precautions:  Precautions Precautions: Fall Restrictions Weight Bearing Restrictions: No  Vital Signs: Therapy Vitals Pulse Rate: 65 BP: (!) 155/57 mmHg  Pain: Pain Assessment Pain Assessment: No/denies pain  See  FIM for current functional status  Therapy/Group: Individual Therapy  [place] 04/04/2015, 12:23 PM

## 2015-04-04 NOTE — Progress Notes (Signed)
Physical Therapy Session Note  Patient Details  Name: Laura Melendez MRN: [id number] Date of Birth: 08/01/1941  Today's Date: 04/04/2015 PT Individual Time: 0800-0900 PT Individual Time Calculation (min): 60 min   Session 2 Time: 1600-1700 Time Calculation (min): 60 min  Short Term Goals: Week 1:  PT Short Term Goal 1 (Week 1): Pt will move supine<>sit w/ ModA consistently PT Short Term Goal 2 (Week 1): Pt will transfer bed<>w/c w/ ModA consistently PT Short Term Goal 3 (Week 1): Pt will initiate w/c propulsion w/ hemi technique PT Short Term Goal 4 (Week 1): Pt will sit<>stand w/ ModA PT Short Term Goal 5 (Week 1): Pt will ambulate w/ assist of 1  Skilled Therapeutic Interventions/Progress Updates:  Session 1: Pt received supine in bed, agreeable to participate in therapy. Session focused on gait training, w/c propulsion, bed mobility, stand pivot transfers. Pt continues to demonstrate poor initiation, significantly delayed processing impacting ability to assist with functional mobility. Pt moved supine>sit w/ TotalA to roll to L/R and MaxA to move from SL<>sit. Instructed pt in gait training w/ rail on L and +2 for close w/c follow, 10' then 25'. O nsecond bout pt with improved reciprocal gait pattern w/ wash cloth under hand on L to improve attention to L hand sliding forward on rail and shoe cover on R foot to improve advancement of R foot. Instructed pt in multiple stand pivot transfers w/ overall MaxA w/ max cueing for weight shifting and sequencing, though noted initiation of reciprocal stepping while in standing. Adjusted pt's w/c to hemi-height to initiate w/c propulsion w/ hemi technique. Pt propelled 15' w/ Mod HOH A to use LUE/LLE for propulsion. Session ended in pt's room, where pt was left seated in w/c w/ husband present w/ all needs within reach.    Session 2: Pt received supine in bed, agreeable to participate in therapy. Session focused on orientation, sustained attention,  w/c propulsion. Upon arrival to room therapist limited distracting stimuli (turned off TV, closed door) and pt noted to be much more verbal than during AM session, oriented to person, place, situation, and time (with external aid of calendar). Additionally, pt given two items of information to recall later in session, and pt recalled information with 100% accuracy 40 minutes later and 60 minutes later. Noted that pt very accurate with recall of biographical facts or information given to her (time of PT session tomorrow), but pt struggled with higher level cognitive tasks when asked to form abstract thought (i.e., "what did you think about that?"). Instructed pt in LUE functional task with clothespins to address sustained attention, LUE motor perseveration. Pt required mod-max cueing to maintain focus on task and continue to next clothespin. Therapist placed apple board in pt's wheelchair, noted improved posture though pt's pelvis still significantly posteriorly tilted, will continue to address positioning and posture in future sessions. Pt's husband present for latter part of session, educated pt and husband on setting up environment to limit distractions to improve pt's attention and focus to tasks. Session ended in pt's room, where pt was left seated in w/c w/ husband present w/ all needs within reach.     Therapy Documentation Precautions:  Precautions Precautions: Fall Restrictions Weight Bearing Restrictions: No Pain:  No/denies pain    See FIM for current functional status  Therapy/Group: Individual Therapy  Rada Hay 04/04/2015, 7:17 AM

## 2015-04-04 NOTE — Evaluation (Signed)
Speech Language Pathology Assessment and Plan  Patient Details  Name: Laura Melendez MRN: [id number] Date of Birth: 01-20-1941  SLP Diagnosis: Cognitive Impairments;Dysphagia;Apraxia;Speech and Language deficits  Rehab Potential: Good ELOS: 3 weeks    Today's Date: 04/04/2015 SLP Individual Time: 0900-1000 SLP Time Calculated (Minutes): 60 minutes   Problem List:  Patient Active Problem List   Diagnosis Date Noted  . 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 Right hemiparesis 04/04/2015  . 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 Aortic thrombus   . Stroke with cerebral ischemia   . CVA (cerebral infarction) 03/30/2015  . 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 Fever   . HCAP (healthcare-associated pneumonia) 01/29/2015  . Sepsis 01/29/2015  . DM (diabetes mellitus) 03/23/2014  . Osteoporosis, unspecified 03/23/2014  . Dyslipidemia 03/23/2014  . Tobacco abuse 03/23/2014  . Syncope 01/01/2014  . Essential hypertension, benign 01/01/2014   Past Medical History:  Past Medical History  Diagnosis Date  . Essential hypertension, benign   . Type 2 diabetes mellitus   . Osteoporosis 06/2008  . Stroke    Past Surgical History:  Past Surgical History  Procedure Laterality Date  . Ankle surgery Left 2004    otif  . Abdominal hysterectomy    . Anterior and posterior repair N/A 03/11/2013    Procedure: ANTERIOR (CYSTOCELE) AND POSTERIOR REPAIR (RECTOCELE);  Surgeon: Linda Hedges, DO;  Location: [facility];  Service: Gynecology;  Laterality: N/A;  with sacrospinous ligament fixation bilateral  . Colonoscopy  2009    negative  . Cataract extraction w/phaco Right 12/27/2014    Procedure: CATARACT EXTRACTION PHACO AND INTRAOCULAR LENS PLACEMENT RIGHT EYE;  Surgeon: Tonny Branch, MD;  Location: [facility];  Service: Ophthalmology;  Laterality: Right;  CDE:10.63  . Cataract extraction w/phaco Left 01/13/2015    Procedure: CATARACT EXTRACTION PHACO AND  INTRAOCULAR LENS PLACEMENT LEFT EYE;  Surgeon: Tonny Branch, MD;  Location: [facility];  Service: Ophthalmology;  Laterality: Left;  CDE:18.87  . Cholecystectomy    . Tee without cardioversion N/A 04/01/2015    Procedure: TRANSESOPHAGEAL ECHOCARDIOGRAM (TEE);  Surgeon: Sanda Klein, MD;  Location: [facility];  Service: Cardiovascular;  Laterality: N/A;    Assessment / Plan / Recommendation Clinical Impression Patient is a 74 y.o. RH-female with history of DM type 2, HTN, DDD lumbar spine, recent febrile illness with negative workup; who was admitted on 03/30/15 with MS changes, incontinence and weakness RLE. 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 MRI brain done revealing subcortical punctate acute infarcts in the high, medial left frontal lobe and MRA without major intracranial occlusion. 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 CTA chest recommended for work up per Dr. Bridgett Larsson and anticoagulation if positive for thrombus. CTA chest revealed eccentric mural thrombus along proximal and descending aorta and patient was cleared to initiate coumadin for likely embolic stroke. TEE done revealing EF 55-60% with grade 1 diastolic dysfunction, severe atheromatous plaque in aortic arch with extensive and deep ulceration but no thrombus. Patient with resultant right hemiparesis, decreased vocal intensity with decreased awareness of deficits and delayed processing. CIR recommended by MD and Rehab team and patient cleared for admission 04/02/15.  Patient was administered a BSE and cognitive-linguistic assessment. Patient presents with a moderate cognitive-based oral dysphagia characterized by increased oral transit time, oral holding, and suspected delayed swallow initiation. Patient consumed trials of ice chips via  spoon with overt s/s of aspiration x1 characterized by very subtle immediate throat clear, trials of thin liquid via  spoon, cup, and straw with overt s/s of aspiration characterized by wet vocal quality x1, and consumed additional trials of Dys. 1 textures with no overt s/s of aspiration. Patient was intermittently unable to initiate a swallow despite Max A verbal, visual, and tactile, most notedly with thin liquids via straw and cup. Recommend MBS to further objectively assess patient's swallow function. Patient furthermore presents with severe cognitive deficits characterized by deficits in initiation, sustained attention, intellectual awareness, functional problem-solving, and recall, with episodes of lability noted throughout evaluation. Patient intermittently followed 1- and 2-step directions given Max A cueing; difficult to determine if due to apraxia or initiation deficits; further assessment is ongoing. Due to limited patient verbal output despite Max A verbal and visual cues, assessment of patient's expressive language abilities is ongoing, however suspect limited verbal expression is due to cognitive deficits. The aforementioned deficits in swallowing, cognition, and functional communication impact patient's ability to complete familiar and functional tasks safely, therefore recommend patient receive skilled SLP intervention to increase swallowing and cognitive-linguistic function to enhance functional independence and reduce caregiver burden prior to discharge.  Skilled Therapeutic Interventions          Patient was administered a BSE and cognitive-linguistic evaluation; see above for further information. At this time recommend patient continue with Dys. 1 textures and honey-thick liquids until further objective swallow assessment can be completed.  SLP Assessment  Patient will need skilled Speech Lanaguage Pathology Services during CIR admission    Recommendations  Recommended Consults: MBS Diet Recommendations: Honey-thick liquid;Dysphagia 1 (Puree) Liquid Administration via: Cup Medication Administration:  Crushed with puree Supervision: Full supervision/cueing for compensatory strategies;Staff to assist with self feeding Compensations: Slow rate;Small sips/bites;Check for pocketing Postural Changes and/or Swallow Maneuvers: Seated upright 90 degrees;Upright 30-60 min after meal Oral Care Recommendations: Oral care BID Recommendations for Other Services: Neuropsych consult Patient destination: Home (TBD) Follow up Recommendations: 24 hour supervision/assistance;Home Health SLP (TBD) Equipment Recommended: To be determined    SLP Frequency 3 to 5 out of 7 days   SLP Treatment/Interventions Environmental controls;Dysphagia/aspiration precaution training;Cueing hierarchy;Cognitive remediation/compensation;Functional tasks;Internal/external aids;Therapeutic Exercise;Therapeutic Activities;Speech/Language facilitation;Patient/family education    Pain Pain Assessment Pain Assessment: No/denies pain   Short Term Goals: Week 1: SLP Short Term Goal 1 (Week 1): Patient will consume least restrictive diet without overt s/s of aspiration and while demonstrating a timely swallow initiation given Mod A verbal, visual, and tactile in 75% of opportunities. SLP Short Term Goal 2 (Week 1): Patient will verbally express wants and needs in functional communication opportunities given Mod A verbal cues in 75% of opportunities. SLP Short Term Goal 3 (Week 1): Patient will demonstrate basic problem-solving with functional tasks given Max A verbal, visual, and tactile cues in 75% of opportunities. SLP Short Term Goal 4 (Week 1): Patient will demonstrate selective attention in a mildly distracting environment for ~5 minutes given Max A verbal, visual, and tactile cues. SLP Short Term Goal 5 (Week 1): Patient will identify 2 new cognitive deficits and 2 new physical deficits given Mod A question cues. SLP Short Term Goal 6 (Week 1): Patient will demonstrate initiation with functional tasks given Mod A verbal, visual,  and tactile cues in 75% of opportunities.  See FIM for current functional status Refer to Care Plan for Long Term Goals  Recommendations for other services: Neuropsych  Discharge Criteria: Patient will be discharged from SLP if patient  refuses treatment 3 consecutive times without medical reason, if treatment goals not met, if there is a change in medical status, if patient makes no progress towards goals or if patient is discharged from hospital.  The above assessment, treatment plan, treatment alternatives and goals were discussed and mutually agreed upon: by patient and by family  Servando Snare 04/04/2015, 4:28 PM

## 2015-04-05 ENCOUNTER — Inpatient Hospital Stay (HOSPITAL_COMMUNITY): Payer: Medicare HMO

## 2015-04-05 ENCOUNTER — Inpatient Hospital Stay (HOSPITAL_COMMUNITY): Payer: Medicare HMO | Admitting: Occupational Therapy

## 2015-04-05 ENCOUNTER — Inpatient Hospital Stay (HOSPITAL_COMMUNITY): Payer: Medicare HMO | Admitting: *Deleted

## 2015-04-05 ENCOUNTER — Inpatient Hospital Stay (HOSPITAL_COMMUNITY): Payer: Medicare HMO | Admitting: Speech Pathology

## 2015-04-05 DIAGNOSIS — I69921 Dysphasia following unspecified cerebrovascular disease: Secondary | ICD-10-CM

## 2015-04-05 DIAGNOSIS — G8190 Hemiplegia, unspecified affecting unspecified side: Secondary | ICD-10-CM

## 2015-04-05 LAB — GLUCOSE, CAPILLARY
GLUCOSE-CAPILLARY: 87 mg/dL (ref 70–99)
GLUCOSE-CAPILLARY: 147 mg/dL — AB (ref 70–99)
Glucose-Capillary: 107 mg/dL — ABNORMAL HIGH (ref 70–99)
Glucose-Capillary: 136 mg/dL — ABNORMAL HIGH (ref 70–99)

## 2015-04-05 LAB — PROTIME-INR
INR: 2.62 — ABNORMAL HIGH (ref 0.00–1.49)
Prothrombin Time: 28.2 seconds — ABNORMAL HIGH (ref 11.6–15.2)

## 2015-04-05 MED ORDER — WARFARIN SODIUM 2.5 MG PO TABS
2.5000 mg | ORAL_TABLET | Freq: Once | ORAL | Status: AC
  Administered 2015-04-05: 2.5 mg via ORAL
  Filled 2015-04-05 (×2): qty 1

## 2015-04-05 NOTE — Progress Notes (Signed)
[facility]     PROGRESS NOTE    Subjective/Complaints: Had a good day yesterday. Improved activity tolerance.  ROS limited due to language. Husband remains at bedside  Objective: Vital Signs: Blood pressure 146/68, pulse 61, temperature 97.6 F (36.4 C), temperature source Oral, resp. rate 17, weight 46.1 kg (101 lb 10.1 oz), SpO2 98 %. No results found.  Recent Labs  04/02/15 1810  WBC 7.7  HGB 13.3  HCT 40.0  PLT 200    Recent Labs  04/02/15 1810 04/04/15 0620  NA 138 140  K 4.1 4.0  CL 103 106  GLUCOSE 107* 112*  BUN 20 19  CREATININE 0.59 0.49*  CALCIUM 9.4 9.1   CBG (last 3)   Recent Labs  04/04/15 1228 04/04/15 2104 04/05/15 0703  GLUCAP 98 118* 107*    Wt Readings from Last 3 Encounters:  04/05/15 46.1 kg (101 lb 10.1 oz)  02/15/15 45.813 kg (101 lb)  01/31/15 44.7 kg (98 lb 8.7 oz)    Physical Exam:  Constitutional: She appears alert, no distress. HENT: oral mucosa moist/pink Head: Normocephalic and atraumatic.  Eyes: Conjunctivae are normal. Pupils are equal, round, and reactive to light.  Neck: Normal range of motion. Neck supple.  Cardiovascular: Normal rate and regular rhythm.  Respiratory: Effort normal and breath sounds normal. No respiratory distress. She has no wheezes.  GI: Soft. Bowel sounds are normal.  Musculoskeletal: She exhibits no edema or tenderness.  Neurological: She is alert. Language improving---identified "watch", "ring". Told me it was April followed simple commands. Speech clearer. RUE: trace movement bicep, wrist in flexor synergy patter. RUE in extensor pattern--no volitional movement seen in leg. DTR's 3+. Right central 7 and tongue deviation.   No withdraw to pain RUE and RLE.  M/S: left shoulder tender with active and PROM.  Psych: flat Skin: Skin is warm and dry.   Assessment/Plan: 1. Functional deficits secondary to left frontal infarct which require 3+ hours per day  of interdisciplinary therapy in a comprehensive inpatient rehab setting. Physiatrist is providing close team supervision and 24 hour management of active medical problems listed below. Physiatrist and rehab team continue to assess barriers to discharge/monitor patient progress toward functional and medical goals. FIM: FIM - Bathing Bathing Steps Patient Completed: Chest, Right upper leg, Left upper leg, Abdomen Bathing: 2: Max-Patient completes 3-4 70f 10 parts or 25-49%  FIM - Upper Body Dressing/Undressing Upper body dressing/undressing steps patient completed: Thread/unthread left sleeve of pullover shirt/dress Upper body dressing/undressing: 1: Total-Patient completed less than 25% of tasks FIM - Lower Body Dressing/Undressing Lower body dressing/undressing: 1: Total-Patient completed less than 25% of tasks  FIM - Toileting Toileting: 1: Two helpers  FIM - Radio producer Devices: Recruitment consultant Transfers: 1-Two helpers  FIM - Control and instrumentation engineer Devices: Arm rests Bed/Chair Transfer: 1: Supine > Sit: Total A (helper does all/Pt. < 25%), 1: Sit > Supine: Total A (helper does all/Pt. < 25%), 2: Bed > Chair or W/C: Max A (lift and lower assist), 2: Chair or W/C > Bed: Max A (lift and lower assist)  FIM - Locomotion: Wheelchair Locomotion: Wheelchair: 1: Travels less than 50 ft with moderate assistance (Pt: 50 - 74%) FIM - Locomotion: Ambulation Locomotion: Ambulation Assistive Devices: Other (comment) (rail on L) Ambulation/Gait Assistance: 1: +2 Total assist, 2: Max assist (+2 for w/c follow) Locomotion: Ambulation: 1: Two helpers  Comprehension Comprehension Mode: Auditory Comprehension: 3-Understands basic 50 - 74%  of the time/requires cueing 25 - 50%  of the time  Expression Expression Mode: Verbal Expression: 2-Expresses basic 25 - 49% of the time/requires cueing 50 - 75% of the time. Uses single  words/gestures.  Social Interaction Social Interaction: 2-Interacts appropriately 25 - 49% of time - Needs frequent redirection.  Problem Solving Problem Solving: 1-Solves basic less than 25% of the time - needs direction nearly all the time or does not effectively solve problems and may need a restraint for safety  Memory Memory: 2-Recognizes or recalls 25 - 49% of the time/requires cueing 51 - 75% of the time  Medical Problem List and Plan: 1. 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 Thoracic aortic and supraceliac aortic thrombus/Anticoagulation: Pharmaceutical: Coumadin. To follow up with Dr. Bridgett Larsson in 6 month for repeat CTA chest/abd/pelvis.  3. Pain Management: Tylenol prn 4. Mood: Will start antidepressant to help with mood. Team to provide ego support. LCSW to follow for evaluation and support.  5. Neuropsych: This patient is capable of making decisions on his own behalf. 6. Skin/Wound Care: Routine pressure relief measures. Maintain adequate nutrition and hydration status.  7. Fluids/Electrolytes/Nutrition:     -encourage po fluids as possible  -continue HS IVF while on honeys 8. HTN: Monitor BP every 8 hours. Continue Lopressor bid, and procardia XL. Avoid hypotension.  9. Dyslipidemia: On lovaza and Pravachol.  10 DM type 2: Hgb A1c- 6.9. Was on metformin  500mg  bid at home. will hold until po intake better.  - Monitor BS ac/hs and use SSI for elevated BS.  11. Osteoporosis: Continue Evista. Resume fosamax at discharge.  12. Dysphagia: swallowing evaluation per SLP.  -D1/honey for now--MBS per SLP  LOS (Days) 3 A FACE TO FACE EVALUATION WAS PERFORMED  SWARTZ,ZACHARY T 04/05/2015 7:55 AM

## 2015-04-05 NOTE — Progress Notes (Signed)
Occupational Therapy Session Note  Patient Details  Name: Laura Melendez MRN: [id number] Date of Birth: 1941/05/30  Today's Date: 04/05/2015 OT Individual Time: 1445-1515 OT Individual Time Calculation (min): 30 min    Short Term Goals: Week 1:  OT Short Term Goal 1 (Week 1): Pt will feed self with LUE with moderate assist OT Short Term Goal 2 (Week 1): Pt. will sit EOB for 10 minutes with no LOB OT Short Term Goal 3 (Week 1): Pt will bathe UB/LB with mod assist OT Short Term Goal 4 (Week 1): Pt. will dress UB/:LB with mod assist OT Short Term Goal 5 (Week 1): Pt. will dress UB with mod assist  Skilled Therapeutic Interventions/Progress Updates:    Pt seen for OT session focusing on functional sitting balance and cognitive retraining. Pt asleep in supine upon arrival, agreeable to tx. Pt agreed to transfer to EOB, requiring total A with HOB elevated and cues for sequencing. Pt able to tolerate sitting EOB for 30 minutes session with B LEs supported on floor and overall min A with occasional SBA and mod cues to return to midline as pt has R leaning tendencies. Seated EOB, pt completed cognitive task of finding and naming objects amongst stimulating back ground. With max cuing, pt able to verbalize needs and name common objects found in picture. Pt requires min A with word finding for naming complex items such as "anchor". Pt became tearful during session when talking about her pet dog. Information provided regarding ability to bring personal pet onto unit. At end of session, pt transferred EOB> supine with max A for management of B Les and total A for posterior scooting in bed. Pt's R arm positioned on pillow. Pt left in supine with all needs in reach, educated on use of call bell and bed alarm on.   Therapy Documentation Precautions:  Precautions Precautions: Fall Restrictions Weight Bearing Restrictions: No Pain: Pain Assessment Pain Assessment: No/denies pain  See FIM for current  functional status  Therapy/Group: Individual Therapy  Lewis, Elizer Bostic C 04/05/2015, 3:24 PM

## 2015-04-05 NOTE — Progress Notes (Signed)
Occupational Therapy Session Note  Patient Details  Name: Laura Melendez MRN: [id number] Date of Birth: July 10, 1941  Today's Date: 04/05/2015 OT Individual Time: 1030-1200 OT Individual Time Calculation (min): 90 min    Short Term Goals: Week 1:  OT Short Term Goal 1 (Week 1): Pt will feed self with LUE with moderate assist OT Short Term Goal 2 (Week 1): Pt. will sit EOB for 10 minutes with no LOB OT Short Term Goal 3 (Week 1): Pt will bathe UB/LB with mod assist OT Short Term Goal 4 (Week 1): Pt. will dress UB/:LB with mod assist OT Short Term Goal 5 (Week 1): Pt. will dress UB with mod assist  Skilled Therapeutic Interventions/Progress Updates: ADL-retraining (60 min) with focus on improved sustained attention and awareness, functional transfers, and adapted bathing and dressing skills.   Pt received supine in bed with husband and son present.  Pt was received supine in bed with gown and brief on.   With max vc and mod assist to lift trunk of bed (HOB elevated), pt rose to sit at edge of bed and maintained postural control using bed rail.   Pt then required  manual facilitation to weight shift to scoot EOB and max assist for SPT to w/c.   Pt requires continuous vc, 1-step commnads with occasional hand-over-hand guidance, to progress through transfers and bathing session with intermittent manual facilitation for improved postural control d/t right anterior-lateral lean.   Pt able to perform sit>stand during bath but requires max assist to achieve full extension during static standing.    Pt used wash mit initially during bath but became distracted by mit and improved attention to bathing after mit was replaced with wash cloth, feasible this date d/t change in IV access to right hand.   Pt required total assist to dress at sink while seated in w/c and was unable to initiate any lower body dressing.   Pt was able to lace her left hand through arm hole and lift arm to place shirt over her head but  could not manage control of shirt d/t right hemiplegia and chronic bil RTC pain.    After dressing, pt was escorted to gym for therapeutic activity (30 min) to facilitate improved reach across midline and weight shifting from left to right hip using AROM arc.   Pt sustained attention to task but reported pain in left shoulder as restricting movements despite repeated readjustment of surfaces to reduce shoulder flexion to less than 90 deg.   Pt was returned to her husband and son who escorted her back to her room for continued visit.    Family alerted to safety precaution with use of safety belt if they leave room.     Therapy Documentation Precautions:  Precautions Precautions: Fall Restrictions Weight Bearing Restrictions: No  Pain: Pain Assessment Pain Assessment: Faces Faces Pain Scale: Hurts even more Pain Type: Chronic pain Pain Location: Shoulder Pain Orientation: Left Pain Descriptors / Indicators: Guarding Pain Frequency: Intermittent Pain Onset: With Activity Patients Stated Pain Goal: 3 Pain Intervention(s): Repositioned;Therapeutic touch Multiple Pain Sites: No  See FIM for current functional status  Therapy/Group: Individual Therapy  [place] 04/05/2015, 12:19 PM

## 2015-04-05 NOTE — Progress Notes (Signed)
Speech Language Pathology Note  Patient Details  Name: Laura Melendez MRN: [id number] Date of Birth: 11/04/1941 Today's Date: 04/05/2015  MBSS complete. Full report located under chart review in imaging section.    [name] 04/05/2015, 10:29 AM

## 2015-04-05 NOTE — Progress Notes (Signed)
ANTICOAGULATION CONSULT NOTE - Follow Up Consult  Pharmacy Consult for coumadin Indication: aortic clot  No Known Allergies  Patient Measurements: Weight: 101 lb 10.1 oz (46.1 kg) Heparin Dosing Weight:   Vital Signs: Temp: 97.6 F (36.4 C) (04/19 0552) Temp Source: Oral (04/19 0552) BP: 146/68 mmHg (04/19 0552) Pulse Rate: 61 (04/19 0552)  Labs:  Recent Labs  04/02/15 1810 04/03/15 1142 04/04/15 0620 04/05/15 0610  HGB 13.3  --   --   --   HCT 40.0  --   --   --   PLT 200  --   --   --   LABPROT  --  20.2* 28.5* 28.2*  INR  --  1.71* 2.65* 2.62*  CREATININE 0.59  --  0.49*  --     Estimated Creatinine Clearance: 44.9 mL/min (by C-G formula based on Cr of 0.49).   Medications:  Scheduled:  . sodium chloride   Intravenous UD  . calcium-vitamin D  1 tablet Oral BID WC  . FLUoxetine  10 mg Oral Daily  . insulin aspart  0-9 Units Subcutaneous TID WC  . lisinopril  2.5 mg Oral Daily  . metoprolol  50 mg Oral BID  . multivitamin with minerals  1 tablet Oral Daily  . NIFEdipine  60 mg Oral Daily  . omega-3 acid ethyl esters  1 g Oral BID  . pravastatin  40 mg Oral q1800  . raloxifene  60 mg Oral Daily  . 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  INR has stabilized @ 2.62 after 1 mg dose yesterday.  Goal of Therapy:  INR 2-3 Monitor platelets by anticoagulation protocol: Yes   Plan:  - coumadin 2.5 mg po x1 - INR in am  Kean Gautreau, [name] 04/05/2015,8:22 AM

## 2015-04-05 NOTE — Progress Notes (Signed)
Physical Therapy Session Note  Patient Details  Name: Laura Melendez MRN: [id number] Date of Birth: 07/24/1941  Today's Date: 04/05/2015 PT Individual Time: 1315-1400 45 min  Short Term Goals: Week 1:  PT Short Term Goal 1 (Week 1): Pt will move supine<>sit w/ ModA consistently PT Short Term Goal 2 (Week 1): Pt will transfer bed<>w/c w/ ModA consistently PT Short Term Goal 3 (Week 1): Pt will initiate w/c propulsion w/ hemi technique PT Short Term Goal 4 (Week 1): Pt will sit<>stand w/ ModA PT Short Term Goal 5 (Week 1): Pt will ambulate w/ assist of 1  Skilled Therapeutic Interventions/Progress Updates:    Pt received seated in w/c, agreeable to participate in therapy after finishing lunch. Pt allowed 15 minutes to finish lunch as SLP had just upgraded diet. On therapist return NT was preparing to assist pt back to bed, as pt was reporting significant fatigue. Pt agreed to stay up for PT session and get back to bed at end of session. Pt transported to/from rehab gym w/ [employer] for time management. Transferred w/c<>mat table w/ stand pivot and Max-TotalA. Worked on postural control with pt straddling bench while sitting on firm foam mat to increase anterior pelvic tilt, thoracic/lumbar extension, and hip flexion with reaching activity with LUE to anterior and L. Pt initially able to attend to task with min cueing, however with fatigue pt unable to initiate movement with LUE. Instead worked on facilitated weight shifting to anterior with therapist providing 80% of facilitation. Pt tolerated session fair, fatigued at end. Session ended in pt's room, where pt was left supine in bed w/ all needs within reach.   Therapy Documentation Precautions:  Precautions Precautions: Fall Restrictions Weight Bearing Restrictions: No General:   Vital Signs: Therapy Vitals Temp: 97.6 F (36.4 C) Temp Source: Oral Pulse Rate: 61 Resp: 17 BP: (!) 146/68 mmHg Patient Position (if appropriate):  Lying Oxygen Therapy SpO2: 98 % O2 Device: Not Delivered Pain:   Mobility:   Locomotion :    Trunk/Postural Assessment :    Balance:   Exercises:   Other Treatments:    See FIM for current functional status  Therapy/Group: Individual Therapy  Rada Hay  Rada Hay, PT, DPT 04/05/2015, 7:44 AM

## 2015-04-06 ENCOUNTER — Inpatient Hospital Stay (HOSPITAL_COMMUNITY): Payer: Medicare HMO | Admitting: Speech Pathology

## 2015-04-06 ENCOUNTER — Inpatient Hospital Stay (HOSPITAL_COMMUNITY): Payer: Medicare HMO | Admitting: Physical Therapy

## 2015-04-06 ENCOUNTER — Inpatient Hospital Stay (HOSPITAL_COMMUNITY): Payer: Medicare HMO | Admitting: Occupational Therapy

## 2015-04-06 LAB — GLUCOSE, CAPILLARY
GLUCOSE-CAPILLARY: 112 mg/dL — AB (ref 70–99)
GLUCOSE-CAPILLARY: 117 mg/dL — AB (ref 70–99)
GLUCOSE-CAPILLARY: 194 mg/dL — AB (ref 70–99)
Glucose-Capillary: 156 mg/dL — ABNORMAL HIGH (ref 70–99)
Glucose-Capillary: 101 mg/dL — ABNORMAL HIGH (ref 70–99)

## 2015-04-06 LAB — PROTIME-INR
INR: 2.52 — ABNORMAL HIGH (ref 0.00–1.49)
PROTHROMBIN TIME: 27.4 s — AB (ref 11.6–15.2)

## 2015-04-06 MED ORDER — WARFARIN SODIUM 2.5 MG PO TABS
2.5000 mg | ORAL_TABLET | Freq: Once | ORAL | Status: AC
  Administered 2015-04-06: 2.5 mg via ORAL
  Filled 2015-04-06 (×2): qty 1

## 2015-04-06 NOTE — Progress Notes (Signed)
Speech Language Pathology Daily Session Note  Patient Details  Name: Laura Melendez MRN: [id number] Date of Birth: Jul 31, 1941  Today's Date: 04/06/2015 SLP Individual Time: 0800-0900 SLP Individual Time Calculation (min): 60 min  Short Term Goals: Week 1: SLP Short Term Goal 1 (Week 1): Patient will consume least restrictive diet without overt s/s of aspiration and while demonstrating a timely swallow initiation given Mod A verbal, visual, and tactile in 75% of opportunities. SLP Short Term Goal 2 (Week 1): Patient will verbally express wants and needs in functional communication opportunities given Mod A verbal cues in 75% of opportunities. SLP Short Term Goal 3 (Week 1): Patient will demonstrate basic problem-solving with functional tasks given Max A verbal, visual, and tactile cues in 75% of opportunities. SLP Short Term Goal 4 (Week 1): Patient will demonstrate selective attention in a mildly distracting environment for ~5 minutes given Max A verbal, visual, and tactile cues. SLP Short Term Goal 5 (Week 1): Patient will identify 2 new cognitive deficits and 2 new physical deficits given Mod A question cues. SLP Short Term Goal 6 (Week 1): Patient will demonstrate initiation with functional tasks given Mod A verbal, visual, and tactile cues in 75% of opportunities.  Skilled Therapeutic Interventions: Skilled treatment session focused on dysphagia and cognitive-linguistic goals. Upon arrival, patient was upright in bed, with husband assisting with consumption of breakfast meal of Dys. 1 textures and thin liquids. Student educated husband on benefits of patient self-feeding; he verbalized understanding but will likely require reinforcement. Patient consumed breakfast meal with no overt s/s of aspiration and markedly decreased holding of boluses this session. Patient independently utilized small bites/sips and a slow rate of feeding and required Supervision A verbal cues for swallowing boluses  before taking another bite/sip. Patient continues to present with decreased initiation in regards to functional tasks (i.e. eating) but initiation was noted to be greater this session as compared to previous sessions. Student further facilitated session with simple verbal description task, which patient required Supervision A verbal cues for recall of rules to task and Mod A verbal and visual cues for initiation and verbal expression associated with task. Patient required Min A question cues to demonstrate use of callbell and Max A verbal cues for recall of mildly complex biographical information and of previous therapy sessions. Patient left upright in bed with all needs within reach and husband present. Continue with current plan of care.   FIM:  Comprehension Comprehension Mode: Auditory Comprehension: 3-Understands basic 50 - 74% of the time/requires cueing 25 - 50%  of the time Expression Expression Mode: Verbal Expression: 3-Expresses basic 50 - 74% of the time/requires cueing 25 - 50% of the time. Needs to repeat parts of sentences. Social Interaction Social Interaction: 3-Interacts appropriately 50 - 74% of the time - May be physically or verbally inappropriate. Problem Solving Problem Solving: 1-Solves basic less than 25% of the time - needs direction nearly all the time or does not effectively solve problems and may need a restraint for safety Memory Memory: 2-Recognizes or recalls 25 - 49% of the time/requires cueing 51 - 75% of the time FIM - Eating Eating Activity: 5: Supervision/cues;6: More than reasonable amount of time  Pain Pain Assessment Pain Assessment: No/denies pain  Therapy/Group: Individual Therapy  Servando Snare 04/06/2015, 10:35 AM

## 2015-04-06 NOTE — Progress Notes (Signed)
Husband very upset that wife was left at nurses station after therapy. Explained the importance of sitting up for a while and that she needed to be at the desk for safety. Husband stated "he won't be leaving again that's for sure". Will continue to educate. Jimmie Molly, RN

## 2015-04-06 NOTE — Progress Notes (Signed)
Physical Therapy Session Note  Patient Details  Name: Laura Melendez MRN: [id number] Date of Birth: 1941-05-19  Today's Date: 04/06/2015 PT Individual Time: 1000-1100 PT Individual Time Calculation (min): 60 min   Short Term Goals: Week 1:  PT Short Term Goal 1 (Week 1): Pt will move supine<>sit w/ ModA consistently PT Short Term Goal 2 (Week 1): Pt will transfer bed<>w/c w/ ModA consistently PT Short Term Goal 3 (Week 1): Pt will initiate w/c propulsion w/ hemi technique PT Short Term Goal 4 (Week 1): Pt will sit<>stand w/ ModA PT Short Term Goal 5 (Week 1): Pt will ambulate w/ assist of 1  Skilled Therapeutic Interventions/Progress Updates:    Therapeutic Activity: Pt received in bed with HOB elevated and alarm on - leaning strongly to L in bed. PT lays bed flat and instructs pt in rolling L in bed req max-tot A, L side lie to sit transfer req max A.  PT instructs pt in sit to stand with L bedrail req mod-max A and +2 assist stand-step transfer with second assist providing SBA for safety and mod-max A for balance.   Neuromuscular Reeducation: PT instructs pt in static sit balance eob - able to achieve and maintain SBA for only a few seconds, then becomes retropulsive to the L and req max-tot A for sit balance. Pt req +2 assist for dynamic sit balance while donning second hospital gown around back to provide modesty.   Gait Training: PT instructs pt in ambulation with L wall rail, progressing to L HHA of PT req mod-max A x 70' total - PT initially facilitates R foot clearance, then changes facilitation technique to emphasizing weight shift side to side and pt demonstrates ability to progress R LE to at least a step-to gait pattern, 2nd assist for w/c follow from husband for safety.  PT instructs pt in ascending/descending a single 7" height stair with L rail req mod-max A x 2 reps, x 2 sets - one step sequencing verbal cues and physical assist to place/remove R foot during stairs (up with  L, down backwards with R).   W/C Management: PT instructs pt in w/c propulsion with L UE req max-tot A x 75' - trying single step cues and emphasis of achieving end point - pt has significant difficulty translating these commands to w/c propulsion.   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 PT suggests to husband to start thinking about having a ramp installed, if not for now, for the future. Continue with all aspects of functional mobility training.   Therapy Documentation Precautions:  Precautions Precautions: Fall Restrictions Weight Bearing Restrictions: No Pain: Pain Assessment Pain Assessment: No/denies pain  See FIM for current functional status  Therapy/Group: Individual Therapy  Darin Redmann M 04/06/2015, 8:56 AM

## 2015-04-06 NOTE — Progress Notes (Signed)
PHYSICAL MEDICINE & REHABILITATION     PROGRESS NOTE    Subjective/Complaints: Had a good day yesterday although she wore out in the afternoon. Had a very good evening.  ROS limited due to language. Husband remains at bedside  Objective: Vital Signs: Blood pressure 127/53, pulse 64, temperature 98.1 F (36.7 C), temperature source Oral, resp. rate 17, weight 46.1 kg (101 lb 10.1 oz), SpO2 93 %. Dg Swallowing Func-speech Pathology  04/05/2015    Objective Swallowing Evaluation:    Patient Details  Name: Laura Melendez MRN: [id number] Date of Birth: 1941-12-06  Today's Date: 04/05/2015 Time: SLP Start Time (ACUTE ONLY): 0900-SLP Stop Time (ACUTE ONLY): 0930 SLP Time Calculation (min) (ACUTE ONLY): 30 min  Past Medical History:  Past Medical History  Diagnosis Date  . Essential hypertension, benign   . Type 2 diabetes mellitus   . Osteoporosis 06/2008  . Stroke    Past Surgical History:  Past Surgical History  Procedure Laterality Date  . Ankle surgery Left 2004    otif  . Abdominal hysterectomy    . Anterior and posterior repair N/A 03/11/2013    Procedure: ANTERIOR (CYSTOCELE) AND POSTERIOR REPAIR (RECTOCELE);   Surgeon: Linda Hedges, DO;  Location: [facility];  Service: Gynecology;   Laterality: N/A;  with sacrospinous ligament fixation bilateral  . Colonoscopy  2009    negative  . Cataract extraction w/phaco Right 12/27/2014    Procedure: CATARACT EXTRACTION PHACO AND INTRAOCULAR LENS PLACEMENT  RIGHT EYE;  Surgeon: Tonny Branch, MD;  Location: [facility];  Service:  Ophthalmology;  Laterality: Right;  CDE:10.63  . Cataract extraction w/phaco Left 01/13/2015    Procedure: CATARACT EXTRACTION PHACO AND INTRAOCULAR LENS PLACEMENT LEFT  EYE;  Surgeon: Tonny Branch, MD;  Location: [facility];  Service: Ophthalmology;   Laterality: Left;  CDE:18.87  . Cholecystectomy    . Tee without cardioversion N/A 04/01/2015    Procedure: TRANSESOPHAGEAL ECHOCARDIOGRAM (TEE);  Surgeon: Sanda Klein, MD;  Location: [facility];  Service: Cardiovascular;   Laterality: N/A;   HPI:  HPI: 74 y.o. female with HTN, DM admitted with altered mental status and  weakness in her right leg more than usual. 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 Patient has on a  Carb Modified diet with thin liquids, however, patient downgraded on 4/17  to Dys. 1 textures with honey-thick liquids due to oral holding. MBS today  to assess dysphagia and possible upgrade.   No Data Recorded  Assessment / Plan / Recommendation CHL IP CLINICAL IMPRESSIONS 04/05/2015  Dysphagia Diagnosis Mild oral phase dysphagia  Clinical impression Patient demonstrates an oral dysphagia due to severe  cognitive impairments characterized by decreased initiation and attention  causing oral holding across all consistencies, however, patient  demonstrates containment of all liquids and textures within the oral  cavity. Oral holding of consistencies is consistent despite modality of  administration. Patient's overall pharyngeal function appears grossly  intact but patient demonstrates an intermittent delayed swallow initiation  to the pyiform sinuses with adequate airway protection with all  liquids/solids.  No penetration or aspiration was noted throughout the  study. Recommend Dys. 1 textures with thin liquids with full supervision  from staff. RN made aware of recommendations. Continue with current plan  of care.       CHL IP TREATMENT RECOMMENDATION 04/05/2015  Treatment Plan Recommendations Therapy as outlined in treatment plan  below      CHL IP DIET RECOMMENDATION 04/05/2015  Diet Recommendations Dysphagia 1 (Puree);Thin liquid  Liquid Administration via Cup;Straw  Medication Administration Crushed with puree  Compensations Slow rate;Small sips/bites;Check for pocketing  Postural Changes and/or Swallow Maneuvers Seated upright 90  degrees;Upright 30-60 min  after meal     CHL IP OTHER RECOMMENDATIONS 04/05/2015  Recommended Consults (None)  Oral Care Recommendations Oral care BID  Other Recommendations (None)     CHL IP FOLLOW UP RECOMMENDATIONS 04/05/2015  Follow up Recommendations Home health SLP;24 hour supervision/assistance     CHL IP FREQUENCY AND DURATION 03/31/2015  Speech Therapy Frequency (ACUTE ONLY) min 2x/week  Treatment Duration (None)     Pertinent Vitals/Pain N/A    SLP Swallow Goals No flowsheet data found.  No flowsheet data found.    No flowsheet data found.   CHL IP ORAL PHASE 04/05/2015  Lips (None)  Tongue (None)  Mucous membranes (None)  Nutritional status (None)  Other (None)  Oxygen therapy (None)  Oral Phase Impaired  Oral - Pudding Teaspoon (None)  Oral - Pudding Cup (None)  Oral - Honey Teaspoon (None)  Oral - Honey Cup (None)  Oral - Honey Syringe (None)  Oral - Nectar Teaspoon Holding of bolus  Oral - Nectar Cup Holding of bolus  Oral - Nectar Straw (None)  Oral - Nectar Syringe (None)  Oral - Ice Chips (None)  Oral - Thin Teaspoon Holding of bolus  Oral - Thin Cup Holding of bolus  Oral - Thin Straw Holding of bolus  Oral - Thin Syringe (None)  Oral - Puree Holding of bolus  Oral - Mechanical Soft (None)  Oral - Regular (None)  Oral - Multi-consistency (None)  Oral - Pill (None)  Oral Phase - Comment (None)      CHL IP PHARYNGEAL PHASE 04/05/2015  Pharyngeal Phase Impaired  Pharyngeal - Pudding Teaspoon (None)  Penetration/Aspiration details (pudding teaspoon) (None)  Pharyngeal - Pudding Cup (None)  Penetration/Aspiration details (pudding cup) (None)  Pharyngeal - Honey Teaspoon (None)  Penetration/Aspiration details (honey teaspoon) (None)  Pharyngeal - Honey Cup (None)  Penetration/Aspiration details (honey cup) (None)  Pharyngeal - Honey Syringe (None)  Penetration/Aspiration details (honey syringe) (None)  Pharyngeal - Nectar Teaspoon Delayed swallow initiation  Penetration/Aspiration details (nectar teaspoon) (None)  Pharyngeal - Nectar  Cup Delayed swallow initiation  Penetration/Aspiration details (nectar cup) (None)  Pharyngeal - Nectar Straw (None)  Penetration/Aspiration details (nectar straw) (None)  Pharyngeal - Nectar Syringe (None)  Penetration/Aspiration details (nectar syringe) (None)  Pharyngeal - Ice Chips (None)  Penetration/Aspiration details (ice chips) (None)  Pharyngeal - Thin Teaspoon Delayed swallow initiation  Penetration/Aspiration details (thin teaspoon) (None)  Pharyngeal - Thin Cup Delayed swallow initiation  Penetration/Aspiration details (thin cup) (None)  Pharyngeal - Thin Straw Delayed swallow initiation  Penetration/Aspiration details (thin straw) (None)  Pharyngeal - Thin Syringe (None)  Penetration/Aspiration details (thin syringe') (None)  Pharyngeal - Puree Delayed swallow initiation  Penetration/Aspiration details (puree) (None)  Pharyngeal - Mechanical Soft (None)  Penetration/Aspiration details (mechanical soft) (None)  Pharyngeal - Regular (None)  Penetration/Aspiration details (regular) (None)  Pharyngeal - Multi-consistency (None)  Penetration/Aspiration details (multi-consistency) (None)  Pharyngeal - Pill (None)  Penetration/Aspiration details (pill) (None)  Pharyngeal Comment (None)     CHL IP CERVICAL ESOPHAGEAL PHASE 04/05/2015  Cervical Esophageal Phase WFL  Pudding Teaspoon (None)  Pudding Cup (None)  Honey Teaspoon (None)  Honey Cup (None)  Honey Syringe (None)  Nectar Teaspoon (None)  Nectar Cup (None)  Nectar Straw (None)  Nectar Syringe (None)  Thin Teaspoon (None)  Thin Cup (None)  Thin Straw (None)  Thin Syringe (None)  Cervical Esophageal Comment (None)    No flowsheet data found.         PAYNE, [place] 04/05/2015, 10:26 AM    No results for input(s): WBC, HGB, HCT, PLT in the last 72 hours.  Recent Labs  04/04/15 0620  NA 140  K 4.0  CL 106  GLUCOSE 112*  BUN 19  CREATININE 0.49*  CALCIUM 9.1   CBG (last 3)   Recent Labs  04/05/15 1634 04/05/15 2049 04/06/15 0645  GLUCAP 87  147* 112*    Wt Readings from Last 3 Encounters:  04/05/15 46.1 kg (101 lb 10.1 oz)  02/15/15 45.813 kg (101 lb)  01/31/15 44.7 kg (98 lb 8.7 oz)    Physical Exam:  Constitutional: She appears alert, no distress. HENT: oral mucosa moist/pink Head: Normocephalic and atraumatic.  Eyes: Conjunctivae are normal. Pupils are equal, round, and reactive to light.  Neck: Normal range of motion. Neck supple.  Cardiovascular: Normal rate and regular rhythm.  Respiratory: Effort normal and breath sounds normal. No respiratory distress. She has no wheezes.  GI: Soft. Bowel sounds are normal.  Musculoskeletal: She exhibits no edema or tenderness.  Neurological: She is alert. RUE: trace movement bicep, wrist in flexor synergy patter. RUE in extensor pattern--no volitional movement seen in leg. DTR's 3+. Right central 7 and tongue deviation.   No withdraw to pain RUE and RLE.  M/S: left shoulder tender with active and PROM.  Psych: flat Skin: Skin is warm and dry.   Assessment/Plan: 1. 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 Physiatrist is providing close team supervision and 24 hour management of active medical problems listed below. Physiatrist and rehab team continue to assess barriers to discharge/monitor patient progress toward functional and medical goals. FIM: FIM - Bathing Bathing Steps Patient Completed: Chest, Right upper leg, Left upper leg, Abdomen Bathing: 2: Max-Patient completes 3-4 62f 10 parts or 25-49%  FIM - Upper Body Dressing/Undressing Upper body dressing/undressing steps patient completed: Thread/unthread left sleeve of pullover shirt/dress Upper body dressing/undressing: 1: Total-Patient completed less than 25% of tasks FIM - Lower Body Dressing/Undressing Lower body dressing/undressing: 1: Total-Patient completed less than 25% of tasks  FIM -  Toileting Toileting: 1: Two helpers  FIM - Radio producer Devices: Recruitment consultant Transfers: 1-Two helpers  FIM - Control and instrumentation engineer Devices: HOB elevated Bed/Chair Transfer: 1: Supine > Sit: Total A (helper does all/Pt. < 25%), 1: Sit > Supine: Total A (helper does all/Pt. < 25%)  FIM - Locomotion: Wheelchair Locomotion: Wheelchair: 1: Travels less than 50 ft with moderate assistance (Pt: 50 - 74%) FIM - Locomotion: Ambulation Locomotion: Ambulation Assistive Devices: Other (comment) (rail on L) Ambulation/Gait Assistance: 1: +2 Total assist, 2: Max assist (+2 for w/c follow) Locomotion: Ambulation: 1: Two helpers  Comprehension Comprehension Mode: Auditory Comprehension: 3-Understands basic 50 - 74% of the time/requires cueing 25 - 50%  of the time  Expression Expression Mode: Verbal Expression: 2-Expresses basic 25 - 49% of the time/requires cueing 50 - 75% of the time. Uses single words/gestures.  Social Interaction Social Interaction: 2-Interacts appropriately 25 - 49% of time - Needs frequent redirection.  Problem Solving Problem Solving: 1-Solves basic less than 25% of the time - needs direction nearly all  the time or does not effectively solve problems and may need a restraint for safety  Memory Memory: 2-Recognizes or recalls 25 - 49% of the time/requires cueing 51 - 75% of the time  Medical Problem List and Plan: 1. 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 Thoracic aortic and supraceliac aortic thrombus/Anticoagulation: Pharmaceutical: Coumadin. To follow up with Dr. Bridgett Larsson in 6 month for repeat CTA chest/abd/pelvis.  3. Pain Management: Tylenol prn 4. Mood: Will start antidepressant to help with mood. Team to provide ego support. LCSW to follow for evaluation and support.  5. Neuropsych: This patient is capable of making decisions on his own  behalf. 6. Skin/Wound Care: Routine pressure relief measures. Maintain adequate nutrition and hydration status.  7. Fluids/Electrolytes/Nutrition:     -encourage po fluids as possible  -dc IVF as on D1 now---encourage PO 8. HTN: Monitor BP every 8 hours. Continue Lopressor bid, and procardia XL. Avoid hypotension.  9. Dyslipidemia: On lovaza and Pravachol.  10 DM type 2: Hgb A1c- 6.9. Was on metformin  500mg  bid at home. will hold until po intake better.  - Monitor BS ac/hs and use SSI for elevated BS.  11. Osteoporosis: Continue Evista. Resume fosamax at discharge.  12. Dysphagia:  .  -D1/thins per SLP  LOS (Days) 4 A FACE TO FACE EVALUATION WAS PERFORMED  SWARTZ,ZACHARY T 04/06/2015 8:12 AM

## 2015-04-06 NOTE — Progress Notes (Signed)
Occupational Therapy Session Note  Patient Details  Name: Laura Melendez MRN: [id number] Date of Birth: 12-14-1941  Today's Date: 04/06/2015 OT Individual Time: 1115-1200 OT Individual Time Calculation (min): 45 min    Short Term Goals: Week 1:  OT Short Term Goal 1 (Week 1): Pt will feed self with LUE with moderate assist OT Short Term Goal 2 (Week 1): Pt. will sit EOB for 10 minutes with no LOB OT Short Term Goal 3 (Week 1): Pt will bathe UB/LB with mod assist OT Short Term Goal 4 (Week 1): Pt. will dress UB/:LB with mod assist OT Short Term Goal 5 (Week 1): Pt. will dress UB with mod assist  Skilled Therapeutic Interventions/Progress Updates:    Pt performed bathing and dressing during session.  Husband was present and eager to assist as well.  He was able to provide verbal encouragement to help pt maintain attention and at times attempted to help pt.  Therapist gave him instruction to let the pt try first and to help cue her along but not to do any tasks for her as it could impede her motor learning.  Pt needed mod facilitation to maintain static sitting EOC during bathing.  Max demonstrational cueing to maintain sustained attention and sequence through bathing.  Decreased initiation to verbal instruction, majority of the time she needed demonstrational hand over hand cueing to wash a part of her body and sequence to the next.  Increased lean to the left with posterior pelvic tilt and thoracic rounding.  Max assist for sit to stand and standing balance during peri care and pulling pants over hips.  Pt was able to help with all aspects of dressing but still needs overall total assist.  Pt left in wheelchair with 1/2 lap tray and husband present.  Educated her husband on applying the safety belt if he is not in direct sight of her.    Therapy Documentation Precautions:  Precautions Precautions: Fall Precaution Comments: right hemiparesis, delayed initiation Restrictions Weight Bearing  Restrictions: No  Pain: Pain Assessment Pain Assessment: No/denies pain ADL: See FIM for current functional status  Therapy/Group: Individual Therapy  Karlisha Mathena OTR/L 04/06/2015, 12:18 PM

## 2015-04-06 NOTE — Progress Notes (Signed)
Occupational Therapy Session Note  Patient Details  Name: Laura Melendez MRN: [id number] Date of Birth: 02-24-1941  Today's Date: 04/06/2015 OT Individual Time: 0017-4944 OT Individual Time Calculation (min): 31 min    Skilled Therapeutic Interventions/Progress Updates:    Pt worked on supine to sit EOB with max demonstrational cueing for all sequencing.  Pt with extremely delayed initiation with any of the tasks to assist with rolling.  Once in sitting she was able to maintain balance initially with min assist.  She demonstrates increased trunk tightness and tightness in her pelvis when attempting PROM in the trunk.  Balance decreased after ROM with pt needing mod facilitation to keep from falling to the right side in sitting.  Progressed session with focus on rotational movements in sitting with forward trunk flexion for reaching items on her bedside table, with the LUE.  She needed max demonstrational cueing to initiate reaching for objects placed on the right side and then repositioning them on the bedside table in front of her.  Attempted to keep pt moving to place objects in hopes of increasing her initiation.  By end of session her ability to initiate and complete the task had improved, but she still needed mod facilitation for initiation.  Performed stand pivot transfer to the wheelchair with max assist.  She needed max facilitation to advance the RLE but then she was able to initiate moving the LLE with slight weightshift facilitation to the right side to unweight.  Pt placed at nurses station for safety.    Therapy Documentation Precautions:  Precautions Precautions: Fall Precaution Comments: right hemiparesis, delayed initiation Restrictions Weight Bearing Restrictions: No  Pain: Pain Assessment Pain Assessment: No/denies pain ADL: See FIM for current functional status  Therapy/Group: Individual Therapy  Mareli Antunes OTR/L 04/06/2015, 4:22 PM

## 2015-04-06 NOTE — Progress Notes (Signed)
ANTICOAGULATION CONSULT NOTE - Follow Up Consult  Pharmacy Consult for coumadin Indication: aortic clot  No Known Allergies  Patient Measurements: Weight: 101 lb 10.1 oz (46.1 kg) Heparin Dosing Weight:   Vital Signs: Temp: 98.1 F (36.7 C) (04/20 0557) Temp Source: Oral (04/20 0557) BP: 127/53 mmHg (04/20 0557) Pulse Rate: 64 (04/20 0557)  Labs:  Recent Labs  04/04/15 0620 04/05/15 0610 04/06/15 0702  LABPROT 28.5* 28.2* 27.4*  INR 2.65* 2.62* 2.52*  CREATININE 0.49*  --   --     Estimated Creatinine Clearance: 44.9 mL/min (by C-G formula based on Cr of 0.49).   Medications:  Scheduled:  . calcium-vitamin D  1 tablet Oral BID WC  . FLUoxetine  10 mg Oral Daily  . insulin aspart  0-9 Units Subcutaneous TID WC  . lisinopril  2.5 mg Oral Daily  . metoprolol  50 mg Oral BID  . multivitamin with minerals  1 tablet Oral Daily  . NIFEdipine  60 mg Oral Daily  . omega-3 acid ethyl esters  1 g Oral BID  . pravastatin  40 mg Oral q1800  . raloxifene  60 mg Oral Daily  . 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  INR today is 2.52  Goal of Therapy:  INR 2-3 Monitor platelets by anticoagulation protocol: Yes   Plan:  - coumadin 2.5 mg po x1 - INR in am  Earnestine Shipp, [name] 04/06/2015,8:23 AM

## 2015-04-07 ENCOUNTER — Inpatient Hospital Stay (HOSPITAL_COMMUNITY): Payer: Medicare HMO | Admitting: Occupational Therapy

## 2015-04-07 ENCOUNTER — Inpatient Hospital Stay (HOSPITAL_COMMUNITY): Payer: Medicare HMO | Admitting: Physical Therapy

## 2015-04-07 ENCOUNTER — Inpatient Hospital Stay (HOSPITAL_COMMUNITY): Payer: Medicare HMO | Admitting: Speech Pathology

## 2015-04-07 ENCOUNTER — Inpatient Hospital Stay (HOSPITAL_COMMUNITY): Payer: Medicare HMO

## 2015-04-07 LAB — BASIC METABOLIC PANEL
Anion gap: 11 (ref 5–15)
BUN: 12 mg/dL (ref 6–23)
CALCIUM: 9.3 mg/dL (ref 8.4–10.5)
CO2: 26 mmol/L (ref 19–32)
CREATININE: 0.55 mg/dL (ref 0.50–1.10)
Chloride: 104 mmol/L (ref 96–112)
GFR calc Af Amer: >90 mL/min (ref >90)
GFR calc non Af Amer: >90 mL/min (ref >90)
GLUCOSE: 125 mg/dL — AB (ref 70–99)
Potassium: 4.2 mmol/L (ref 3.5–5.1)
Sodium: 141 mmol/L (ref 135–145)

## 2015-04-07 LAB — GLUCOSE, CAPILLARY
GLUCOSE-CAPILLARY: 123 mg/dL — AB (ref 70–99)
Glucose-Capillary: 118 mg/dL — ABNORMAL HIGH (ref 70–99)
Glucose-Capillary: 116 mg/dL — ABNORMAL HIGH (ref 70–99)
Glucose-Capillary: 117 mg/dL — ABNORMAL HIGH (ref 70–99)

## 2015-04-07 LAB — PROTIME-INR
INR: 3.36 — ABNORMAL HIGH (ref 0.00–1.49)
PROTHROMBIN TIME: 34.3 s — AB (ref 11.6–15.2)

## 2015-04-07 MED ORDER — WARFARIN 0.5 MG HALF TABLET
0.5000 mg | ORAL_TABLET | Freq: Once | ORAL | Status: AC
  Administered 2015-04-07: 0.5 mg via ORAL
  Filled 2015-04-07: qty 1

## 2015-04-07 NOTE — Progress Notes (Signed)
Occupational Therapy Session Note  Patient Details  Name: Laura Melendez MRN: [id number] Date of Birth: January 25, 1941  Today's Date: 04/07/2015 OT Individual Time: 1100-1200 OT Individual Time Calculation (min): 60 min    Short Term Goals: Week 1:  OT Short Term Goal 1 (Week 1): Pt will feed self with LUE with moderate assist OT Short Term Goal 2 (Week 1): Pt. will sit EOB for 10 minutes with no LOB OT Short Term Goal 3 (Week 1): Pt will bathe UB/LB with mod assist OT Short Term Goal 4 (Week 1): Pt. will dress UB/:LB with mod assist OT Short Term Goal 5 (Week 1): Pt. will dress UB with mod assist  Skilled Therapeutic Interventions/Progress Updates:  Pt transitioning easily from PT session with no c/o pain. Her husband and daughter present to observe session. Pt agreeable to washing at sink side and dressing per family request. Pt required [facility] assistance to incorporate R UE into functional tasks. Pt required mod verbal cues to look and and pick up R UE during session. Perseveration on washing face while seated at sink. STS with max A and manual assist for safety awareness of R UE when standing. Total A for LB clothing management and pt able to clean peri area while in stand position but with R lateral lean noted. Manual facilitation for upright posture while in standing. Pt working on forward trunk flexion while seated on EOB to reach for various items with L hand on sink. Pt required HOH assist to comb hair and brush teeth this session. Call bell within reach with pt demonstrating ability to press button. Caregivers remain present in room.   Therapy Documentation Precautions:  Precautions Precautions: Fall Precaution Comments: right hemiparesis, delayed initiation Restrictions Weight Bearing Restrictions: No Vital Signs: Therapy Vitals Temp: 97.5 F (36.4 C) Temp Source: Oral Pulse Rate: 72 Resp: 16 BP: (!) 142/62 mmHg Patient Position (if appropriate): Lying Oxygen Therapy SpO2: 98  % O2 Device: Not Delivered  See FIM for current functional status  Therapy/Group: Individual Therapy  Phineas Semen 04/07/2015, 5:32 PM

## 2015-04-07 NOTE — Progress Notes (Signed)
Social Work Patient ID: Laura Melendez, female   DOB: September 05, 1941, 74 y.o.   MRN: [id number]   CSW met with pt and her husband to update them on team conference discussion.  Husband seemed comfortable with d/c date and feels he can provide minimal assistance required at home.  CSW explained that he would receive family education prior to d/c and he was appreciative of this, although he often times attends therapies with pt at this time.  CSW will continue to follow and assist as needed.

## 2015-04-07 NOTE — Progress Notes (Signed)
ANTICOAGULATION CONSULT NOTE - Follow Up Consult  Pharmacy Consult for coumadin Indication: aortic clot  No Known Allergies  Patient Measurements: Weight: 101 lb 10.1 oz (46.1 kg) Heparin Dosing Weight:   Vital Signs: Temp: 97.9 F (36.6 C) (04/21 0506) Temp Source: Oral (04/21 0506) BP: 156/54 mmHg (04/21 0506) Pulse Rate: 64 (04/21 0506)  Labs:  Recent Labs  04/05/15 0610 04/06/15 0702 04/07/15 0718  LABPROT 28.2* 27.4* 34.3*  INR 2.62* 2.52* 3.36*    Estimated Creatinine Clearance: 44.9 mL/min (by C-G formula based on Cr of 0.49).   Medications:  Scheduled:  . calcium-vitamin D  1 tablet Oral BID WC  . FLUoxetine  10 mg Oral Daily  . insulin aspart  0-9 Units Subcutaneous TID WC  . lisinopril  2.5 mg Oral Daily  . metoprolol  50 mg Oral BID  . multivitamin with minerals  1 tablet Oral Daily  . NIFEdipine  60 mg Oral Daily  . omega-3 acid ethyl esters  1 g Oral BID  . pravastatin  40 mg Oral q1800  . raloxifene  60 mg Oral Daily  . warfarin  0.5 mg Oral ONCE-1800  . 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  INR today is 3.36 from 2.52 (don't know why the big jump)  Goal of Therapy:  INR 2-3 Monitor platelets by anticoagulation protocol: Yes   Plan:  - coumadin 0.5 mg po x - INR in am  Nylani Michetti, [name] 04/07/2015,8:24 AM

## 2015-04-07 NOTE — Progress Notes (Signed)
Physical Therapy Session Note  Patient Details  Name: Laura Melendez MRN: [id number] Date of Birth: 10-05-1941  Today's Date: 04/07/2015 PT Individual Time: 1000-1100 PT Individual Time Calculation (min): 60 min   Short Term Goals: Week 1:  PT Short Term Goal 1 (Week 1): Pt will move supine<>sit w/ ModA consistently PT Short Term Goal 2 (Week 1): Pt will transfer bed<>w/c w/ ModA consistently PT Short Term Goal 3 (Week 1): Pt will initiate w/c propulsion w/ hemi technique PT Short Term Goal 4 (Week 1): Pt will sit<>stand w/ ModA PT Short Term Goal 5 (Week 1): Pt will ambulate w/ assist of 1  Skilled Therapeutic Interventions/Progress Updates:    Therapeutic Activity: Pt received in bed with alarm on - PT instructs pt in rolling L in bed without rail req max A, L side lie to sit transfer req max A. PT instructs pt in sit to stand req mod-max A and mod A for balance stand-step transfer bed to w/c.   Gait training: PT instructs pt in ambulation with HHA and physical assist for lateral weight shifting x 180' req up to mod A for balance with w/c follow for safety.  PT instructs pt in ascending 2 standard steps (7") forward/descending backwards with L rail req mod A for balance, single step sequencing cues, and assist with R foot placement.  PT instructs pt in ascending/descending 5 steps (5-7" height) with L rail req mod-max A for sequencing and R foot placement.   W/C Management: PT instructs pt in w/c propulsion with B UEs req hand over hand assist with R UE x 100' req mod A overall and verbal cues for large stroke with L UE.   Pt ended session up in w/c with husband present, who agrees to ask RN to either place quick release safety belt of help pt back to bed when he leaves. Husband and daughter present for tx, providing encouragement throughout PT session. Pt continues to have significant difficulty with sequencing, as well as balance. 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 Continue per PT POC.   Therapy Documentation Precautions:  Precautions Precautions: Fall Precaution Comments: right hemiparesis, delayed initiation Restrictions Weight Bearing Restrictions: No Pain: Pain Assessment Pain Assessment: 0-10 Pain Score: 10-Worst pain ever Faces Pain Scale: No hurt Pain Type: Chronic pain Pain Location: Shoulder Pain Orientation: Left Pain Descriptors / Indicators: Aching Pain Onset: With Activity Pain Intervention(s): Rest Multiple Pain Sites: No See FIM for current functional status  Therapy/Group: Individual Therapy  [name] M 04/07/2015, 8:49 AM

## 2015-04-07 NOTE — Progress Notes (Signed)
Speech Language Pathology Daily Session Note  Patient Details  Name: Laura Melendez MRN: [id number] Date of Birth: 28-Feb-1941  Today's Date: 04/07/2015 SLP Individual Time: 0800-0900 SLP Individual Treatment Time: 60 min  Short Term Goals: Week 1: SLP Short Term Goal 1 (Week 1): Patient will consume least restrictive diet without overt s/s of aspiration and while demonstrating a timely swallow initiation given Mod A verbal, visual, and tactile in 75% of opportunities. SLP Short Term Goal 2 (Week 1): Patient will verbally express wants and needs in functional communication opportunities given Mod A verbal cues in 75% of opportunities. SLP Short Term Goal 3 (Week 1): Patient will demonstrate basic problem-solving with functional tasks given Max A verbal, visual, and tactile cues in 75% of opportunities. SLP Short Term Goal 4 (Week 1): Patient will demonstrate selective attention in a mildly distracting environment for ~5 minutes given Max A verbal, visual, and tactile cues. SLP Short Term Goal 5 (Week 1): Patient will identify 2 new cognitive deficits and 2 new physical deficits given Mod A question cues. SLP Short Term Goal 6 (Week 1): Patient will demonstrate initiation with functional tasks given Mod A verbal, visual, and tactile cues in 75% of opportunities.  Skilled Therapeutic Interventions: Skilled treatment session focused on dysphagia and cognitive goals. SLP facilitated session by providing Min A verbal cues for initiation with self-feeding with breakfast meal of Dys. 1 textures with thin liquids.  Patient demonstrated increased oral holding for an extraordinary amount of time and Max A multimodal cues to initiate swallow, however, patient did not demonstrate any overt s/s of aspiration.  Patient also demonstrated decreased verbal expression and required Mod A question cues for utilization and reading comprehension of her daily schedule.  Patient left supine in bed with all needs within  reach. Continue with current plan of care.    Pain No/Denies Pain   Therapy/Group: Individual Therapy  Daphnie Venturini 04/07/2015, 4:40 PM

## 2015-04-07 NOTE — Progress Notes (Addendum)
[facility]     PROGRESS NOTE    Subjective/Complaints: Wife in bed. No complaints. Saw that husband was upset about wife being left up at RN station  ROS limited due to language. Husband remains at bedside  Objective: Vital Signs: Blood pressure 156/54, pulse 64, temperature 97.9 F (36.6 C), temperature source Oral, resp. rate 16, weight 46.1 kg (101 lb 10.1 oz), SpO2 97 %. Dg Swallowing Func-speech Pathology  04/05/2015    Objective Swallowing Evaluation:    Patient Details  Name: Laura Melendez MRN: [id number] Date of Birth: Aug 12, 1941  Today's Date: 04/05/2015 Time: SLP Start Time (ACUTE ONLY): 0900-SLP Stop Time (ACUTE ONLY): 0930 SLP Time Calculation (min) (ACUTE ONLY): 30 min  Past Medical History:  Past Medical History  Diagnosis Date  . Essential hypertension, benign   . Type 2 diabetes mellitus   . Osteoporosis 06/2008  . Stroke    Past Surgical History:  Past Surgical History  Procedure Laterality Date  . Ankle surgery Left 2004    otif  . Abdominal hysterectomy    . Anterior and posterior repair N/A 03/11/2013    Procedure: ANTERIOR (CYSTOCELE) AND POSTERIOR REPAIR (RECTOCELE);   Surgeon: Linda Hedges, DO;  Location: [facility];  Service: Gynecology;   Laterality: N/A;  with sacrospinous ligament fixation bilateral  . Colonoscopy  2009    negative  . Cataract extraction w/phaco Right 12/27/2014    Procedure: CATARACT EXTRACTION PHACO AND INTRAOCULAR LENS PLACEMENT  RIGHT EYE;  Surgeon: Tonny Branch, MD;  Location: [facility];  Service:  Ophthalmology;  Laterality: Right;  CDE:10.63  . Cataract extraction w/phaco Left 01/13/2015    Procedure: CATARACT EXTRACTION PHACO AND INTRAOCULAR LENS PLACEMENT LEFT  EYE;  Surgeon: Tonny Branch, MD;  Location: [facility];  Service: Ophthalmology;   Laterality: Left;  CDE:18.87  . Cholecystectomy    . Tee without cardioversion N/A 04/01/2015    Procedure: TRANSESOPHAGEAL ECHOCARDIOGRAM (TEE);  Surgeon: Sanda Klein, MD;  Location: [facility]  ENDOSCOPY;  Service: Cardiovascular;   Laterality: N/A;   HPI:  HPI: 74 y.o. female with HTN, DM admitted with altered mental status and  weakness in her right leg more than usual. 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 Patient has on a  Carb Modified diet with thin liquids, however, patient downgraded on 4/17  to Dys. 1 textures with honey-thick liquids due to oral holding. MBS today  to assess dysphagia and possible upgrade.   No Data Recorded  Assessment / Plan / Recommendation CHL IP CLINICAL IMPRESSIONS 04/05/2015  Dysphagia Diagnosis Mild oral phase dysphagia  Clinical impression Patient demonstrates an oral dysphagia due to severe  cognitive impairments characterized by decreased initiation and attention  causing oral holding across all consistencies, however, patient  demonstrates containment of all liquids and textures within the oral  cavity. Oral holding of consistencies is consistent despite modality of  administration. Patient's overall pharyngeal function appears grossly  intact but patient demonstrates an intermittent delayed swallow initiation  to the pyiform sinuses with adequate airway protection with all  liquids/solids.  No penetration or aspiration was noted throughout the  study. Recommend Dys. 1 textures with thin liquids with full supervision  from staff. RN made aware of recommendations. Continue with current plan  of care.       CHL IP TREATMENT RECOMMENDATION 04/05/2015  Treatment Plan Recommendations Therapy as outlined in treatment  plan below      CHL IP DIET RECOMMENDATION 04/05/2015  Diet Recommendations Dysphagia 1 (Puree);Thin liquid  Liquid Administration via Cup;Straw  Medication Administration Crushed with puree  Compensations Slow rate;Small sips/bites;Check for pocketing  Postural Changes and/or Swallow Maneuvers Seated upright 90  degrees;Upright 30-60 min  after meal     CHL IP OTHER RECOMMENDATIONS 04/05/2015  Recommended Consults (None)  Oral Care Recommendations Oral care BID  Other Recommendations (None)     CHL IP FOLLOW UP RECOMMENDATIONS 04/05/2015  Follow up Recommendations Home health SLP;24 hour supervision/assistance     CHL IP FREQUENCY AND DURATION 03/31/2015  Speech Therapy Frequency (ACUTE ONLY) min 2x/week  Treatment Duration (None)     Pertinent Vitals/Pain N/A    SLP Swallow Goals No flowsheet data found.  No flowsheet data found.    No flowsheet data found.   CHL IP ORAL PHASE 04/05/2015  Lips (None)  Tongue (None)  Mucous membranes (None)  Nutritional status (None)  Other (None)  Oxygen therapy (None)  Oral Phase Impaired  Oral - Pudding Teaspoon (None)  Oral - Pudding Cup (None)  Oral - Honey Teaspoon (None)  Oral - Honey Cup (None)  Oral - Honey Syringe (None)  Oral - Nectar Teaspoon Holding of bolus  Oral - Nectar Cup Holding of bolus  Oral - Nectar Straw (None)  Oral - Nectar Syringe (None)  Oral - Ice Chips (None)  Oral - Thin Teaspoon Holding of bolus  Oral - Thin Cup Holding of bolus  Oral - Thin Straw Holding of bolus  Oral - Thin Syringe (None)  Oral - Puree Holding of bolus  Oral - Mechanical Soft (None)  Oral - Regular (None)  Oral - Multi-consistency (None)  Oral - Pill (None)  Oral Phase - Comment (None)      CHL IP PHARYNGEAL PHASE 04/05/2015  Pharyngeal Phase Impaired  Pharyngeal - Pudding Teaspoon (None)  Penetration/Aspiration details (pudding teaspoon) (None)  Pharyngeal - Pudding Cup (None)  Penetration/Aspiration details (pudding cup) (None)  Pharyngeal - Honey Teaspoon (None)  Penetration/Aspiration details (honey teaspoon) (None)  Pharyngeal - Honey Cup (None)  Penetration/Aspiration details (honey cup) (None)  Pharyngeal - Honey Syringe (None)  Penetration/Aspiration details (honey syringe) (None)  Pharyngeal - Nectar Teaspoon Delayed swallow initiation  Penetration/Aspiration details (nectar teaspoon) (None)  Pharyngeal - Nectar  Cup Delayed swallow initiation  Penetration/Aspiration details (nectar cup) (None)  Pharyngeal - Nectar Straw (None)  Penetration/Aspiration details (nectar straw) (None)  Pharyngeal - Nectar Syringe (None)  Penetration/Aspiration details (nectar syringe) (None)  Pharyngeal - Ice Chips (None)  Penetration/Aspiration details (ice chips) (None)  Pharyngeal - Thin Teaspoon Delayed swallow initiation  Penetration/Aspiration details (thin teaspoon) (None)  Pharyngeal - Thin Cup Delayed swallow initiation  Penetration/Aspiration details (thin cup) (None)  Pharyngeal - Thin Straw Delayed swallow initiation  Penetration/Aspiration details (thin straw) (None)  Pharyngeal - Thin Syringe (None)  Penetration/Aspiration details (thin syringe') (None)  Pharyngeal - Puree Delayed swallow initiation  Penetration/Aspiration details (puree) (None)  Pharyngeal - Mechanical Soft (None)  Penetration/Aspiration details (mechanical soft) (None)  Pharyngeal - Regular (None)  Penetration/Aspiration details (regular) (None)  Pharyngeal - Multi-consistency (None)  Penetration/Aspiration details (multi-consistency) (None)  Pharyngeal - Pill (None)  Penetration/Aspiration details (pill) (None)  Pharyngeal Comment (None)     CHL IP CERVICAL ESOPHAGEAL PHASE 04/05/2015  Cervical Esophageal Phase WFL  Pudding Teaspoon (None)  Pudding Cup (None)  Honey Teaspoon (None)  Honey Cup (None)  Honey Syringe (None)  Nectar Teaspoon (None)  Nectar Cup (  None)  Nectar Straw (None)  Nectar Syringe (None)  Thin Teaspoon (None)  Thin Cup (None)  Thin Straw (None)  Thin Syringe (None)  Cervical Esophageal Comment (None)    No flowsheet data found.         PAYNE, [place] 04/05/2015, 10:26 AM    No results for input(s): WBC, HGB, HCT, PLT in the last 72 hours. No results for input(s): NA, K, CL, GLUCOSE, BUN, CREATININE, CALCIUM in the last 72 hours.  Invalid input(s): CO CBG (last 3)   Recent Labs  04/06/15 1948 04/06/15 2100 04/07/15 0644  GLUCAP 101*  194* 118*    Wt Readings from Last 3 Encounters:  04/05/15 46.1 kg (101 lb 10.1 oz)  02/15/15 45.813 kg (101 lb)  01/31/15 44.7 kg (98 lb 8.7 oz)    Physical Exam:  Constitutional: She appears alert, no distress. HENT: oral mucosa moist/pink Head: Normocephalic and atraumatic.  Eyes: Conjunctivae are normal. Pupils are equal, round, and reactive to light.  Neck: Normal range of motion. Neck supple.  Cardiovascular: Normal rate and regular rhythm.  Respiratory: Effort normal and breath sounds normal. No respiratory distress. She has no wheezes.  GI: Soft. Bowel sounds are normal.  Musculoskeletal: She exhibits no edema or tenderness.  Neurological: She is alert. RUE: trace movement bicep, wrist in flexor synergy patter. RUE in extensor pattern--no volitional movement seen in leg. DTR's 3+. Right central 7 and tongue deviation.   No withdraw to pain RUE and RLE.  M/S: left shoulder tender with active and PROM.  Psych: flat Skin: Skin is warm and dry.   Assessment/Plan: 1. 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 Physiatrist is providing close team supervision and 24 hour management of active medical problems listed below. Physiatrist and rehab team continue to assess barriers to discharge/monitor patient progress toward functional and medical goals. FIM: FIM - Bathing Bathing Steps Patient Completed: Chest, Right upper leg, Left upper leg, Abdomen Bathing: 2: Max-Patient completes 3-4 47f 10 parts or 25-49%  FIM - Upper Body Dressing/Undressing Upper body dressing/undressing steps patient completed: Thread/unthread left sleeve of pullover shirt/dress Upper body dressing/undressing: 2: Max-Patient completed 25-49% of tasks FIM - Lower Body Dressing/Undressing Lower body dressing/undressing: 1: Total-Patient completed less than 25% of tasks  FIM -  Toileting Toileting: 1: Two helpers  FIM - Radio producer Devices: Recruitment consultant Transfers: 1-Two helpers  FIM - Control and instrumentation engineer Devices: Bed rails Bed/Chair Transfer: 2: Supine > Sit: Max A (lifting assist/Pt. 25-49%), 2: Bed > Chair or W/C: Max A (lift and lower assist)  FIM - Locomotion: Wheelchair Distance: 75 Locomotion: Wheelchair: 1: Total Assistance/staff pushes wheelchair (Pt<25%) (pt pushes less than 25%) FIM - Locomotion: Ambulation Locomotion: Ambulation Assistive Devices: Other (comment) (L wall rail progressing to hand hold assist) Ambulation/Gait Assistance: 1: +2 Total assist (mod-max A from PT; w/c follow from 2nd assist) Locomotion: Ambulation: 1: Two helpers  Comprehension Comprehension Mode: Auditory Comprehension: 3-Understands basic 50 - 74% of the time/requires cueing 25 - 50%  of the time  Expression Expression Mode: Verbal Expression: 3-Expresses basic 50 - 74% of the time/requires cueing 25 - 50% of the time. Needs to repeat parts of sentences.  Social Interaction Social Interaction: 3-Interacts appropriately 50 - 74% of the time - May be physically or verbally inappropriate.  Problem Solving Problem Solving: 1-Solves basic less than 25% of the time -  needs direction nearly all the time or does not effectively solve problems and may need a restraint for safety  Memory Memory: 2-Recognizes or recalls 25 - 49% of the time/requires cueing 51 - 75% of the time  Medical Problem List and Plan: 1. 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 Thoracic aortic and supraceliac aortic thrombus/Anticoagulation: Pharmaceutical: Coumadin. To follow up with Dr. Bridgett Larsson in 6 month for repeat CTA chest/abd/pelvis.  3. Pain Management: Tylenol prn 4. Mood: Will start antidepressant to help with mood. Team to provide ego support. LCSW to follow for  evaluation and support.  5. Neuropsych: This patient is capable of making decisions on his own behalf. 6. Skin/Wound Care: Routine pressure relief measures. Maintain adequate nutrition and hydration status.  7. Fluids/Electrolytes/Nutrition:     -encourage po fluids as possible  -dc'ed IVF as on D1 now---encourage PO  -bmet pending today 8. HTN: Monitor BP every 8 hours. Continue Lopressor bid, and procardia XL. Avoid hypotension.  9. Dyslipidemia: On lovaza and Pravachol.  10 DM type 2: Hgb A1c- 6.9. Was on metformin  500mg  bid at home. will hold until po intake better.  - Monitor BS ac/hs and use SSI for elevated BS.  11. Osteoporosis: Continue Evista. Resume fosamax at discharge.  12. Dysphagia:  .  -D1/thins per SLP  LOS (Days) 5 A FACE TO FACE EVALUATION WAS PERFORMED  SWARTZ,ZACHARY T 04/07/2015 8:10 AM

## 2015-04-07 NOTE — Progress Notes (Signed)
Physical Therapy Session Note  Patient Details  Name: Laura Melendez MRN: [id number] Date of Birth: [birth year]  Today's Date: 04/07/2015 PT Individual Time: 1430-1500 PT Individual Time Calculation (min): 30 min   Short Term Goals: Week 1:  PT Short Term Goal 1 (Week 1): Pt will move supine<>sit w/ ModA consistently PT Short Term Goal 2 (Week 1): Pt will transfer bed<>w/c w/ ModA consistently PT Short Term Goal 3 (Week 1): Pt will initiate w/c propulsion w/ hemi technique PT Short Term Goal 4 (Week 1): Pt will sit<>stand w/ ModA PT Short Term Goal 5 (Week 1): Pt will ambulate w/ assist of 1  Skilled Therapeutic Interventions/Progress Updates:   Session focused on neuro re-ed to address bed mobility (supine <-> sit, scooting up, and rolling), postural control and staticsitting balance seated EOB with manual facilitation and verbal cueing by therapist, and sit to stands/standing balance x 2 reps. Pt required up to max A for bed mobility and balance. Pt able to sit with S x ~ 48min with pt's L hand on therapist's shoulder for balance statically but otherwise required mod to max A to maintain upright posture with posterior lean. Limited ROM at pelvis in sitting and will benefit from further stretching to increase ability for upright posture. Bridging used to assist with scooting up in the bed. Repositioned in supine at end of session with RUE elevated on pillow for positioning and edema control.   Therapy Documentation Precautions:  Precautions Precautions: Fall Precaution Comments: right hemiparesis, delayed initiation Restrictions Weight Bearing Restrictions: No   Pain:  Denies pain.   See FIM for current functional status  Therapy/Group: Individual Therapy  Canary Brim Ivory Broad, PT, DPT  04/07/2015, 3:40 PM

## 2015-04-07 NOTE — Patient Care Conference (Addendum)
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 Right hemiparesis 04/04/2015  . 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 Aortic thrombus   . Stroke with cerebral ischemia   . CVA (cerebral infarction) 03/30/2015  . 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 Fever   . HCAP (healthcare-associated pneumonia) 01/29/2015  . Sepsis 01/29/2015  . DM (diabetes mellitus) 03/23/2014  . Osteoporosis, unspecified 03/23/2014  . Dyslipidemia 03/23/2014  . Tobacco abuse 03/23/2014  . Syncope 01/01/2014  . Essential hypertension, benign 01/01/2014    Expected Discharge Date: Expected Discharge Date: 04/22/15  Team Members Present: Physician leading conference: Dr. Alger Simons Social Worker Present: Alfonse Alpers, LCSW Nurse Present: Dorien Chihuahua, RN PT Present: Canary Brim, PT OT Present: Salome Spotted, OT;Pharrell Ledford Tamala Julian, OT;Other (comment) Napoleon Form, OT) SLP Present: Weston Anna, SLP Other (Discipline and Name): Danne Baxter, RN [facility]) PPS Coordinator present : Daiva Nakayama, RN, CRRN     Current Status/Progress Goal Weekly Team Focus  Medical   large left mca infarct, right hemiparesis, aphasia  improve swallowing, attention  swallowing, increasing activity tolerance   Bowel/Bladder   Patient is  incontinent of both bowel and bladder.   Manage bowel and bladder with max assistance.   Place patient on timed tolieting    Swallow/Nutrition/ Hydration   Dys. 1 textures; upgarded to thin liquids per MBS on 4/19; requires Max A verbal cues for oral holding  Min A for use of swallowing strategies  oral holding, possible trials of upgraded textures   ADL's   Overall Mod-Max A for B & D, min-mod self-feeding/grooming, Sup-Min A cognitive (awareness, attention)  Min A dynamic standing, B & D: Supervision sitting balance, grooming and self-feeding  Adapted bathing/dressing, cognitive remediation, DME/AE training, family education, dynamic sitting/standing balance   Mobility   Mod-Max for bed mobility and transfers, Max for w/c propulsion and ambulation  overall supervision for bed mobility and w/c propulsion, MinA for short distance ambulation and transfers  functional use of LUE, cognitive remediation, initiation, postural control, balance   Communication   Max A for initation of utterances  Min A  initiation of verbal expression,   Safety/Cognition/ Behavioral Observations  Max A  Min A  initiation, sustained attention, safety awareness, functional problem solving   Pain   currently no complaints of pain  Patients pain with stay at or below a 3  contine to monitor patients pain every 4 hours and prn.   Skin   Currently skin is free from breakdown.   Remain free from skin breakdown while at rehab  contine to monitor patients skin every shift and prn.    Rehab Goals Patient on target to meet rehab goals: Yes Rehab Goals Revised: none, as this is pt's first conference *See Care Plan and progress notes  for long and short-term goals.  Barriers to Discharge: right hemiparesis, poor initiation    Possible Resolutions to Barriers:  ?stimulant, cognitive behavioral rx,     Discharge Planning/Teaching Needs:  Pt plans to return to her home with her husband to provide minimal assistance.   Husband will participate in family education closer to d/c.   Team Discussion:  Pt with left sided weakness, but with increased alertness.  Focus is on swallowing.  PT feels pt's cognition varies within the day and based on fatigue.  OT reports that pt can do one step commands, but it is hard for her to get started.  ST performed MBS on day of conference.  Although pt is orally holding food/liquids, she is not aspirated them, so pt upgraded to thin liquids.  Initiation and attention are impaired and this is what is affecting her swallowing and communication.  Goals are set for minimal assistance.  Revisions to Treatment Plan:  Therapies will try to be scheduled in the morning when pt is more rested.   Continued Need for Acute Rehabilitation Level of Care: The patient requires daily medical management by a physician with specialized training in physical medicine and rehabilitation for the following conditions: Daily direction of a multidisciplinary physical rehabilitation program to ensure safe treatment while eliciting the highest outcome that is of practical value to the patient.: Yes Daily medical management of patient stability for increased activity during participation in an intensive rehabilitation regime.: Yes Daily analysis of laboratory values and/or radiology reports with any subsequent need for medication adjustment of medical intervention for : Neurological problems;Post surgical problems  Areal Cochrane, Silvestre Mesi 04/07/2015, 11:07 AM

## 2015-04-08 ENCOUNTER — Inpatient Hospital Stay (HOSPITAL_COMMUNITY): Payer: Medicare HMO | Admitting: Physical Therapy

## 2015-04-08 ENCOUNTER — Inpatient Hospital Stay (HOSPITAL_COMMUNITY): Payer: Medicare HMO

## 2015-04-08 ENCOUNTER — Inpatient Hospital Stay (HOSPITAL_COMMUNITY): Payer: Medicare HMO | Admitting: Speech Pathology

## 2015-04-08 LAB — GLUCOSE, CAPILLARY
GLUCOSE-CAPILLARY: 113 mg/dL — AB (ref 70–99)
Glucose-Capillary: 126 mg/dL — ABNORMAL HIGH (ref 70–99)
Glucose-Capillary: 119 mg/dL — ABNORMAL HIGH (ref 70–99)
Glucose-Capillary: 128 mg/dL — ABNORMAL HIGH (ref 70–99)

## 2015-04-08 LAB — PROTIME-INR
INR: 3.01 — AB (ref 0.00–1.49)
Prothrombin Time: 31.4 seconds — ABNORMAL HIGH (ref 11.6–15.2)

## 2015-04-08 MED ORDER — WARFARIN SODIUM 1 MG PO TABS
1.0000 mg | ORAL_TABLET | Freq: Once | ORAL | Status: AC
  Administered 2015-04-08: 1 mg via ORAL
  Filled 2015-04-08: qty 1

## 2015-04-08 NOTE — Progress Notes (Signed)
ANTICOAGULATION CONSULT NOTE - Follow Up Consult  Pharmacy Consult for coumadin Indication: arotic clot  No Known Allergies  Patient Measurements: Weight: 101 lb 10.1 oz (46.1 kg) Heparin Dosing Weight:   Vital Signs: Temp: 97.9 F (36.6 C) (04/22 0522) Temp Source: Oral (04/22 0522) BP: 159/67 mmHg (04/22 0522) Pulse Rate: 60 (04/22 0522)  Labs:  Recent Labs  04/06/15 0702 04/07/15 0718  LABPROT 27.4* 34.3*  INR 2.52* 3.36*  CREATININE  --  0.55    Estimated Creatinine Clearance: 44.9 mL/min (by C-G formula based on Cr of 0.55).   Medications:  Scheduled:  . calcium-vitamin D  1 tablet Oral BID WC  . FLUoxetine  10 mg Oral Daily  . insulin aspart  0-9 Units Subcutaneous TID WC  . lisinopril  2.5 mg Oral Daily  . metoprolol  50 mg Oral BID  . multivitamin with minerals  1 tablet Oral Daily  . NIFEdipine  60 mg Oral Daily  . omega-3 acid ethyl esters  1 g Oral BID  . pravastatin  40 mg Oral q1800  . raloxifene  60 mg Oral Daily  . 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  INR today is 3.01.  Goal of Therapy:  INR 2-3 Monitor platelets by anticoagulation protocol: Yes   Plan:  - coumadin 1mg  po x1 - INR in am  Desirae Mancusi, [name] 04/08/2015,8:34 AM

## 2015-04-08 NOTE — Progress Notes (Signed)
[place] PHYSICAL MEDICINE & REHABILITATION     PROGRESS NOTE    Subjective/Complaints: No new complaints. Slept well. Denies pain.  ROS limited due to language. Husband at bedside  Objective: Vital Signs: Blood pressure 159/67, pulse 60, temperature 97.9 F (36.6 C), temperature source Oral, resp. rate 16, weight 46.1 kg (101 lb 10.1 oz), SpO2 97 %. No results found. No results for input(s): WBC, HGB, HCT, PLT in the last 72 hours.  Recent Labs  04/07/15 0718  NA 141  K 4.2  CL 104  GLUCOSE 125*  BUN 12  CREATININE 0.55  CALCIUM 9.3   CBG (last 3)   Recent Labs  04/07/15 1628 04/07/15 2117 04/08/15 0650  GLUCAP 117* 123* 126*    Wt Readings from Last 3 Encounters:  04/05/15 46.1 kg (101 lb 10.1 oz)  02/15/15 45.813 kg (101 lb)  01/31/15 44.7 kg (98 lb 8.7 oz)    Physical Exam:  Constitutional: She appears alert, no distress. HENT: oral mucosa moist/pink Head: Normocephalic and atraumatic.  Eyes: Conjunctivae are normal. Pupils are equal, round, and reactive to light.  Neck: Normal range of motion. Neck supple.  Cardiovascular: Normal rate and regular rhythm.  Respiratory: Effort normal and breath sounds normal. No respiratory distress. She has no wheezes.  GI: Soft. Bowel sounds are normal.  Musculoskeletal: She exhibits no edema or tenderness.  Neurological: She is alert. RUE: trace movement bicep, wrist in flexor synergy patter. RUE in extensor pattern--no volitional movement seen in leg. DTR's 3+. Right central 7 and tongue deviation.   No withdraw to pain RUE and RLE.  M/S: left shoulder tender with active and PROM.  Psych: flat Skin: Skin is warm and dry. No breakdown  Assessment/Plan: 1. 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 Physiatrist is providing close team supervision and 24 hour management of active medical problems  listed below. Physiatrist and rehab team continue to assess barriers to discharge/monitor patient progress toward functional and medical goals.  FIM: FIM - Bathing Bathing Steps Patient Completed: Chest, Right upper leg, Left upper leg, Abdomen, Front perineal area Bathing: 3: Mod-Patient completes 5-7 78f 10 parts or 50-74%  FIM - Upper Body Dressing/Undressing Upper body dressing/undressing steps patient completed: Thread/unthread left sleeve of pullover shirt/dress Upper body dressing/undressing: 2: Max-Patient completed 25-49% of tasks FIM - Lower Body Dressing/Undressing Lower body dressing/undressing: 1: Total-Patient completed less than 25% of tasks  FIM - Toileting Toileting: 1: Two helpers  FIM - Radio producer Devices: Recruitment consultant Transfers: 1-Two helpers  FIM - Control and instrumentation engineer Devices: Bed rails Bed/Chair Transfer: 2: Supine > Sit: Max A (lifting assist/Pt. 25-49%), 2: Sit > Supine: Max A (lifting assist/Pt. 25-49%)  FIM - Locomotion: Wheelchair Distance: 100 Locomotion: Wheelchair: 2: Travels 50 - 149 ft with moderate assistance (Pt: 50 - 74%) FIM - Locomotion: Ambulation Locomotion: Ambulation Assistive Devices: Other (comment) (hand hold assist) Ambulation/Gait Assistance: 3: Mod assist Locomotion: Ambulation: 3: Travels 150 ft or more with moderate assistance (Pt: 50 - 74%)  Comprehension Comprehension Mode: Auditory Comprehension: 3-Understands basic 50 - 74% of the time/requires cueing 25 - 50%  of the time  Expression Expression Mode: Verbal Expression: 3-Expresses basic 50 - 74% of the time/requires cueing 25 - 50% of the time. Needs to repeat parts of sentences.  Social Interaction Social Interaction: 3-Interacts appropriately 50 - 74% of the time - May  be physically or verbally inappropriate.  Problem Solving Problem Solving: 1-Solves basic less than 25% of the time - needs  direction nearly all the time or does not effectively solve problems and may need a restraint for safety  Memory Memory: 2-Recognizes or recalls 25 - 49% of the time/requires cueing 51 - 75% of the time  Medical Problem List and Plan: 1. 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 Thoracic aortic and supraceliac aortic thrombus/Anticoagulation: Pharmaceutical: Coumadin. To follow up with Dr. Bridgett Larsson in 6 month for repeat CTA chest/abd/pelvis.  3. Pain Management: Tylenol prn 4. Mood: Will start antidepressant to help with mood. Team to provide ego support. LCSW to follow for evaluation and support.  5. Neuropsych: This patient is capable of making decisions on his own behalf. 6. Skin/Wound Care: Routine pressure relief measures. Maintain adequate nutrition and hydration status  -allevyn dressing for sacrum.  7. Fluids/Electrolytes/Nutrition:     -encourage po fluids as possible  -dc'ed IVF as on D1 now---encourage PO  -bmet normal yesterday 8. HTN: Monitor BP every 8 hours. Continue Lopressor bid, and procardia XL. Avoid hypotension.  9. Dyslipidemia: On lovaza and Pravachol.  10 DM type 2: Hgb A1c- 6.9. Was on metformin  500mg  bid at home. will hold until po intake better.  - Monitor BS ac/hs and use SSI for elevated BS.  11. Osteoporosis: Continue Evista. Resume fosamax at discharge.  12. Dysphagia:  .  -D1/thins per SLP  LOS (Days) 6 A FACE TO FACE EVALUATION WAS PERFORMED  Nikoli Nasser T 04/08/2015 7:34 AM

## 2015-04-08 NOTE — Progress Notes (Signed)
Physical Therapy Session Note  Patient Details  Name: Laura Melendez MRN: [id number] Date of Birth: [birth year]  Today's Date: 04/08/2015 PT Individual Time: 1100-1130 PT Individual Time Calculation (min): 30 min   Short Term Goals: Week 1:  PT Short Term Goal 1 (Week 1): Pt will move supine<>sit w/ ModA consistently PT Short Term Goal 2 (Week 1): Pt will transfer bed<>w/c w/ ModA consistently PT Short Term Goal 3 (Week 1): Pt will initiate w/c propulsion w/ hemi technique PT Short Term Goal 4 (Week 1): Pt will sit<>stand w/ ModA PT Short Term Goal 5 (Week 1): Pt will ambulate w/ assist of 1  Skilled Therapeutic Interventions/Progress Updates:   Pt seen for skilled PT session with focus on initiation and motor planning during functional tasks.  Pt requesting to have a BM; pt transferred w/c <> toilet with raised seat with max A stand pivot with max-total verbal and tactile cues to initiate scooting, leaning and pivoting once standing but only min-mod to initiate sit <> stand.  While toileting engaged pt in therapeutic conversation, assessment of orientation and awareness to work on initiation of speech.  Pt able to have BM on toilet and able to perform hygiene and pull pants up in mod-max A supported standing with mod verbal cues for sequencing.  Once back in w/c pt brought to sink and given cue to wash her hands.  Pt required increased time, max verbal, visual and tactile cues for initiation and sequencing of task and to cross midline with LUE to reach cold water controls and soap.  At end of session pt's husband was asking why her RUE is so swollen.  Discussed with husband reasons for edema in hemi-plegic UE and importance of positioning and elevation when in bed or w/c.  Demonstrated positioning in w/c with RUE supported on tray and pillow.  Pt left with husband with all items within reach.  Therapy Documentation Precautions:  Precautions Precautions: Fall Precaution Comments: right  hemiparesis, delayed initiation Restrictions Weight Bearing Restrictions: No Pain: Pain Assessment Pain Assessment: No/denies pain  See FIM for current functional status  Therapy/Group: Individual Therapy  Raylene Everts [facility] 04/08/2015, 12:18 PM

## 2015-04-08 NOTE — Progress Notes (Signed)
Speech Language Pathology Daily Session Note  Patient Details  Name: Laura Melendez MRN: [id number] Date of Birth: October 01, 1941  Today's Date: 04/08/2015 SLP Individual Time: 0800-0900 SLP Individual Time Calculation (min): 60 min  Short Term Goals: Week 1: SLP Short Term Goal 1 (Week 1): Patient will consume least restrictive diet without overt s/s of aspiration and while demonstrating a timely swallow initiation given Mod A verbal, visual, and tactile in 75% of opportunities. SLP Short Term Goal 1 - Progress (Week 1): Progressing toward goal SLP Short Term Goal 2 (Week 1): Patient will verbally express wants and needs in functional communication opportunities given Mod A verbal cues in 75% of opportunities. SLP Short Term Goal 2 - Progress (Week 1): Progressing toward goal SLP Short Term Goal 3 (Week 1): Patient will demonstrate basic problem-solving with functional tasks given Max A verbal, visual, and tactile cues in 75% of opportunities. SLP Short Term Goal 4 (Week 1): Patient will demonstrate selective attention in a mildly distracting environment for ~5 minutes given Max A verbal, visual, and tactile cues. SLP Short Term Goal 5 (Week 1): Patient will identify 2 new cognitive deficits and 2 new physical deficits given Mod A question cues. SLP Short Term Goal 6 (Week 1): Patient will demonstrate initiation with functional tasks given Mod A verbal, visual, and tactile cues in 75% of opportunities.  Skilled Therapeutic Interventions: Skilled treatment focused on addressing dysphgia and cognitive goals with breakfast meal tray. Husband present in room for the session. Patient initially required maximal verbal, tactile cues to initiate swallow of puree solids, however when SLP prompted her to take a sip of liquids, she swallowed puree solids more efficiently. Patient improved with initiating swallow with solids as session progressed, requiring moderate frequency of verba/tactile cues. Patient did  not initiate verbally indicating when she was finished with a food item, but would hover her spoon over the bowl and not bring it to mouth. When SLP asked her if she was finished, she would respond "yes" or "I'm done". No overt s/s aspiration and oral cavity was clear post swallows and at end of meal. Patient's spouse asked if she could eat a small tangerine, and SLP observed her consuming 5 sections of the tangerine, which she masticated and swallowed in a timely manner. Patient was able to respond accurately (husband confirmed) to biographical questions, mainly with single word responses or 2-3 word responses. She initiated verbal communication 3 times during session, once to her daughter on the phone, when her nurse left and when SLP left, "nice to meet you too". Husband felt that she ate a lot better today as compared to yesterday. (amount and efficiency). Continue with current plan of care.   FIM:  Comprehension Comprehension Mode: Auditory Comprehension: 4-Understands basic 75 - 89% of the time/requires cueing 10 - 24% of the time Expression Expression Mode: Verbal Expression: 3-Expresses basic 50 - 74% of the time/requires cueing 25 - 50% of the time. Needs to repeat parts of sentences. Social Interaction Social Interaction: 3-Interacts appropriately 50 - 74% of the time - May be physically or verbally inappropriate. Problem Solving Problem Solving: 1-Solves basic less than 25% of the time - needs direction nearly all the time or does not effectively solve problems and may need a restraint for safety Memory Memory: 2-Recognizes or recalls 25 - 49% of the time/requires cueing 51 - 75% of the time FIM - Eating Eating Activity: 6: More than reasonable amount of time;5: Supervision/cues;5: Set-up assist for open containers;5: Needs  verbal cues/supervision  Pain Pain Assessment Pain Assessment: No/denies pain Pain Score: 0-No pain  Therapy/Group: Individual Therapy  Dannial Monarch 04/08/2015, 11:42 AM   Sonia Baller, MA, CCC-SLP 04/08/2015 11:42 AM

## 2015-04-08 NOTE — Progress Notes (Signed)
Physical Therapy Session Note  Patient Details  Name: Laura Melendez MRN: [id number] Date of Birth: 08-30-1941  Today's Date: 04/08/2015 PT Individual Time: 1400-1500 PT Individual Time Calculation (min): 60 min   Short Term Goals: Week 1:  PT Short Term Goal 1 (Week 1): Pt will move supine<>sit w/ ModA consistently PT Short Term Goal 2 (Week 1): Pt will transfer bed<>w/c w/ ModA consistently PT Short Term Goal 3 (Week 1): Pt will initiate w/c propulsion w/ hemi technique PT Short Term Goal 4 (Week 1): Pt will sit<>stand w/ ModA PT Short Term Goal 5 (Week 1): Pt will ambulate w/ assist of 1  Skilled Therapeutic Interventions/Progress Updates:    Gait Training: PT instructs pt in ambulation with mod HHA, PT facilitating weight shift - pt walks in a R LE crouched gait with shuffle step and smaller step length - unable to follow verbal cues to correct the above pattern.  PT instructs pt in ascending 4 (4" height) stairs with L rail req mod A, then pt removes L hand from rail due to chronic shoulder pain (? Rotator cuff tendinopathy) and req max A to descend 4 stairs backwards with minimal L arm support.  PT instructs pt in BWSTT with harness for safety and min-mod body weight support x 10 minutes with PT facilitating weight shift and improved R foot clearance with gait, up to 0.5 speed.   Therapeutic Exercise: PT instructs pt in L arm ER req significant AAROM to attempt to strengthen rotator cuff x 8 reps - pt unable to fully grasp the exercise.  PT instructs pt in lacing fingers together and trying to squeeze hands together/curl fingers x 5 reps x 5 second holds in an attempt to achieve neurologic overflow and get R UE distal muscle activation - no overt success.   Therapeutic Activity: PT instructs pt in repeated sit to stands with L arm on // bar x 5 reps req min A - initiation improves with this hand hold.   Pt's initiation with sit to stand improves when L hand is placed on // bar for  a cue to stand up. Pt may be ready to transition to ambulation using a cane in L hand, while therapist continues to provide weight shifting/balance assist. Chronic L rotator cuff issues limit pt's ability to use L rail on stairs. Small movement in R thumb noted during PT session, today. Continue per PT pOC.   Therapy Documentation Precautions:  Precautions Precautions: Fall Precaution Comments: right hemiparesis, delayed initiation Restrictions Weight Bearing Restrictions: No Pain: Pain Assessment Pain Assessment: 0-10 Pain Score:  (unable to rate) Pain Type: Chronic pain Pain Location: Shoulder Pain Orientation: Left Pain Descriptors / Indicators: Aching Pain Onset: With Activity  See FIM for current functional status  Therapy/Group: Individual Therapy  Nedra Mcinnis M 04/08/2015, 8:38 AM

## 2015-04-08 NOTE — Progress Notes (Signed)
Occupational Therapy Session Note  Patient Details  Name: Laura Melendez MRN: [id number] Date of Birth: 1941-10-16  Today's Date: 04/08/2015 OT Individual Time: 0930-1030 OT Individual Time Calculation (min): 60 min    Short Term Goals: Week 1:  OT Short Term Goal 1 (Week 1): Pt will feed self with LUE with moderate assist OT Short Term Goal 2 (Week 1): Pt. will sit EOB for 10 minutes with no LOB OT Short Term Goal 3 (Week 1): Pt will bathe UB/LB with mod assist OT Short Term Goal 4 (Week 1): Pt. will dress UB/:LB with mod assist OT Short Term Goal 5 (Week 1): Pt. will dress UB with mod assist  Skilled Therapeutic Interventions/Progress Updates: ADL-retraining with focus on improved transfers, weight-shifting, attention to right, static/dynamic sitting balance, sit<>stand, and family ed.   Pt received supine in bed, alert and attentive to therapist with improved communication as evidenced by expanded responses to questions and improved eye-contact w/therapist.   Pt receptive for shower level bathing and dressing at sink.   Pt's husband was not present during first half of treatment.   With overall max assist to roll to her left, pt rose to perform SPT to w/c with max assist and facilitation to weight shift to her left and weight-bear through her RLE.   Pt's transfer improved at shower to overall mod assist to lift using grab bar to pull herself up with left hand.   Pt required mod assist to perform lateral scoot to her right and to reposition at bench but maintained sitting posture throughout shower with only intermittent assist to reposition from anterior pelvic tilt, again using grab bar with left hand.   Pt washed upper body and observed her right thumb extending as well as forearm supination as her right arm rested on her thigh.   Pt required overall mod assist to bathe and extra time to process single step commands to sequence through bathing.   Pt stood to wash peri-area as her husband arrived  in bathroom and he observed assist from OT demonstrating method to support pt's trunk with therapist providing manual contact from right hip to left lower scapula.   Pt completed shower and returned to w/c repositioned to sink side for dressing.    Pt completed sit<>stand 3 times during assisted lower body dressing and maintained static supported standing at sink for approx 30 seconds while she observed herself in the mirror.    Pt required mod assist to don bra and shirt and overall total assist for brief, pants, and socks.   Pt left in w/c at end of session with her husband attending to all her needs.        Therapy Documentation Precautions:  Precautions Precautions: Fall Precaution Comments: right hemiparesis, delayed initiation Restrictions Weight Bearing Restrictions: No  Pain: Pain Assessment Pain Assessment: No/denies pain Pain Score: 0-No pain  See FIM for current functional status  Therapy/Group: Individual Therapy  Hend Mccarrell 04/08/2015, 10:42 AM

## 2015-04-09 ENCOUNTER — Inpatient Hospital Stay (HOSPITAL_COMMUNITY): Payer: Medicare HMO | Admitting: Speech Pathology

## 2015-04-09 ENCOUNTER — Inpatient Hospital Stay (HOSPITAL_COMMUNITY): Payer: Medicare HMO | Admitting: Physical Therapy

## 2015-04-09 LAB — GLUCOSE, CAPILLARY
GLUCOSE-CAPILLARY: 86 mg/dL (ref 70–99)
Glucose-Capillary: 126 mg/dL — ABNORMAL HIGH (ref 70–99)
Glucose-Capillary: 163 mg/dL — ABNORMAL HIGH (ref 70–99)
Glucose-Capillary: 131 mg/dL — ABNORMAL HIGH (ref 70–99)

## 2015-04-09 LAB — PROTIME-INR
INR: 2.59 — ABNORMAL HIGH (ref 0.00–1.49)
PROTHROMBIN TIME: 28 s — AB (ref 11.6–15.2)

## 2015-04-09 MED ORDER — WARFARIN SODIUM 2 MG PO TABS
2.0000 mg | ORAL_TABLET | Freq: Once | ORAL | Status: AC
  Administered 2015-04-09: 2 mg via ORAL
  Filled 2015-04-09: qty 1

## 2015-04-09 NOTE — Plan of Care (Signed)
Problem: RH BLADDER ELIMINATION Goal: RH STG MANAGE BLADDER WITH ASSISTANCE STG Manage Bladder independent.[husband able to]  Outcome: Progressing incontinent

## 2015-04-09 NOTE — Progress Notes (Signed)
Physical Therapy Session Note  Patient Details  Name: Laura Melendez MRN: [id number] Date of Birth: 1941/04/06  Today's Date: 04/09/2015 PT Individual Time: 1300-1400 PT Individual Time Calculation (min): 60 min   Short Term Goals: Week 1:  PT Short Term Goal 1 (Week 1): Pt will move supine<>sit w/ ModA consistently PT Short Term Goal 2 (Week 1): Pt will transfer bed<>w/c w/ ModA consistently PT Short Term Goal 3 (Week 1): Pt will initiate w/c propulsion w/ hemi technique PT Short Term Goal 4 (Week 1): Pt will sit<>stand w/ ModA PT Short Term Goal 5 (Week 1): Pt will ambulate w/ assist of 1  Skilled Therapeutic Interventions/Progress Updates:    Gait Training: PT gives pt a SPC to hold in her L hand and instructs pt in ambulation x 150' req min A-max A, pending balance - pt progressively leans strongly to the R every few steps - PT facilitates weight shifts R/L to improve stepping distance.   Neuromuscular Reeducation: PT instructs pt in static and dynamic standing balance activities: utilizing mirror for visual feedback to reorient pt to true vertical - pt req frequent redirection to look at mirror and not down at ground - pt is unable to follow commands to give PT a hip bump or to touch pt's shoulder to PT's shoulder.  PT instructs pt in leaning L dynamic stand balance activity with cognitive reorientation/verbalization activity: name the color of the horseshoe and then place on basketball rim (lowest position to accommodate for chronic L shoulder pain) - then name a color that is not the horseshoe, then try to name 2 colors that are not the horseshoe - pt is unable to complete multi-step thinking tasks.   Therapeutic Activity: PT instructs pt in sit to stand with L hand on // bar x 10 reps req min-mod A with significant amount of increased time and focus on sequencing. Pt is able to stand statically with L hand on // bar x 20 seconds while R knee progressively bends, before PT has to  assist her.   Pt is developing pushing to the R with L side - R LE continues to be apraxic and hemiplegic with increase in tone in R LE & UE. Continue per PT POC.   Therapy Documentation Precautions:  Precautions Precautions: Fall Precaution Comments: right hemiparesis, delayed initiation Restrictions Weight Bearing Restrictions: No Pain: Pain Assessment Pain Assessment: Faces Pain Score: 6  Pain Type: Chronic pain Pain Location: Arm Pain Orientation: Left Pain Descriptors / Indicators: Grimacing;Aching Pain Onset: With Activity (arm abduction) Pain Intervention(s): Repositioned Multiple Pain Sites: No  See FIM for current functional status  Therapy/Group: Individual Therapy  Zi Newbury M 04/09/2015, 12:54 PM

## 2015-04-09 NOTE — Progress Notes (Signed)
ANTICOAGULATION CONSULT NOTE - Follow Up Consult  Pharmacy Consult for coumadin Indication: arotic clot  No Known Allergies  Patient Measurements: Weight: 101 lb 10.1 oz (46.1 kg)  Vital Signs: Temp: 97.5 F (36.4 C) (04/23 0450) Temp Source: Oral (04/23 0450) BP: 143/66 mmHg (04/23 0450) Pulse Rate: 69 (04/23 0450)  Labs:  Recent Labs  04/07/15 0718 04/08/15 0802 04/09/15 0625  LABPROT 34.3* 31.4* 28.0*  INR 3.36* 3.01* 2.59*  CREATININE 0.55  --   --     Estimated Creatinine Clearance: 44.9 mL/min (by C-G formula based on Cr of 0.55).   Medications:  Scheduled:  . calcium-vitamin D  1 tablet Oral BID WC  . FLUoxetine  10 mg Oral Daily  . insulin aspart  0-9 Units Subcutaneous TID WC  . lisinopril  2.5 mg Oral Daily  . metoprolol  50 mg Oral BID  . multivitamin with minerals  1 tablet Oral Daily  . NIFEdipine  60 mg Oral Daily  . omega-3 acid ethyl esters  1 g Oral BID  . pravastatin  40 mg Oral q1800  . raloxifene  60 mg Oral Daily  . Warfarin - Pharmacist Dosing Inpatient   Does not apply q1800   Infusions:    Assessment: 74 yo female with aortic clot is now therapeutic again on coumadin.  INR today is 2.59.  Goal of Therapy:  INR 2-3 Monitor platelets by anticoagulation protocol: Yes   Plan:  - coumadin 2mg  po x1. Anticipate patient to tolerate 2 mg daily  - INR in am  Albertina Parr, PharmD., BCPS Clinical Pharmacist Pager [phone]

## 2015-04-09 NOTE — Progress Notes (Signed)
  [place] PHYSICAL MEDICINE & REHABILITATION     PROGRESS NOTE    Subjective/Complaints:  Difficulty with language No new complaints Objective: Vital Signs: Blood pressure 134/53, pulse 57, temperature 98 F (36.7 C), temperature source Oral, resp. rate 16, weight 101 lb 10.1 oz (46.1 kg), SpO2 95 %.   Well-developed well-nourished female in no acute distress. HEENT exam atraumatic, normocephalic, extraocular muscles are intact. Neck is supple. No jugular venous distention no thyromegaly. Chest clear to auscultation without increased work of breathing. Cardiac exam S1 and S2 are regular. Abdominal exam active bowel sounds, soft, nontender. Extremities no edema.   Assessment/Plan: 1. Functional deficits secondary to left frontal infarct   Medical Problem List and Plan: 1. 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 Thoracic aortic and supraceliac aortic thrombus/Anticoagulation: Pharmaceutical: Coumadin. To follow up with Dr. Bridgett Larsson in 6 month for repeat CTA chest/abd/pelvis.  Lab Results  Component Value Date   INR 2.59* 04/09/2015   INR 3.01* 04/08/2015   INR 3.36* 04/07/2015    3. Pain Management: Tylenol prn 4. Mood: Will start antidepressant to help with mood. Team to provide ego support. LCSW to follow for evaluation and support.  5. Neuropsych: This patient is capable of making decisions on his own behalf. 6. Skin/Wound Care: Routine pressure relief measures. Maintain adequate nutrition and hydration status  -allevyn dressing for sacrum.  7. Fluids/Electrolytes/Nutrition:     -encourage po fluids as possible  -dc'ed IVF as on D1 now---encourage PO  - Basic Metabolic Panel:    Component Value Date/Time   NA 141 04/07/2015 0718   NA 142 02/21/2015 0810   K 4.2 04/07/2015 0718   CL 104 04/07/2015 0718   CO2 26 04/07/2015 0718   BUN 12 04/07/2015 0718   BUN 16 02/21/2015 0810   CREATININE 0.55 04/07/2015  0718   CREATININE 0.66 09/06/2014 0925   GLUCOSE 125* 04/07/2015 0718   GLUCOSE 130* 02/21/2015 0810   CALCIUM 9.3 04/07/2015 0718    8. HTN: Monitor BP every 8 hours. Continue Lopressor bid, and procardia XL. Avoid hypotension.  BP     130/53-143/66     9. Dyslipidemia: On lovaza and Pravachol.  10 DM type 2: Hgb A1c- 6.9. Was on metformin  500mg  bid at home. will hold until po intake better.  - Monitor BS ac/hs and use SSI for elevated BS.  CBG (last 3)   Recent Labs  04/09/15 0629 04/09/15 1203 04/09/15 1659  GLUCAP 126* 163* 86     11. Osteoporosis: Continue Evista. Resume fosamax at discharge.  12. Dysphagia:  .  -D1/thins per SLP  LOS (Days) 7 A FACE TO [place] 04/09/2015 5:32 PM

## 2015-04-09 NOTE — Progress Notes (Signed)
Speech Language Pathology Daily Session Note  Patient Details  Name: JANIAYA RYSER MRN: [id number] Date of Birth: Apr 18, 1941  Today's Date: 04/09/2015 SLP Individual Time: 3159-4585 SLP Individual Time Calculation (min): 30 min  Short Term Goals: Week 1: SLP Short Term Goal 1 (Week 1): Patient will consume least restrictive diet without overt s/s of aspiration and while demonstrating a timely swallow initiation given Mod A verbal, visual, and tactile in 75% of opportunities. SLP Short Term Goal 1 - Progress (Week 1): Progressing toward goal SLP Short Term Goal 2 (Week 1): Patient will verbally express wants and needs in functional communication opportunities given Mod A verbal cues in 75% of opportunities. SLP Short Term Goal 2 - Progress (Week 1): Progressing toward goal SLP Short Term Goal 3 (Week 1): Patient will demonstrate basic problem-solving with functional tasks given Max A verbal, visual, and tactile cues in 75% of opportunities. SLP Short Term Goal 4 (Week 1): Patient will demonstrate selective attention in a mildly distracting environment for ~5 minutes given Max A verbal, visual, and tactile cues. SLP Short Term Goal 5 (Week 1): Patient will identify 2 new cognitive deficits and 2 new physical deficits given Mod A question cues. SLP Short Term Goal 6 (Week 1): Patient will demonstrate initiation with functional tasks given Mod A verbal, visual, and tactile cues in 75% of opportunities.  Skilled Therapeutic Interventions: Skilled ST intervention provided with focus on dysphagia goals. Pt seen in room for ST tx session, while seated upright in w/c. Slp modified environment by decreasing auditory and visual distractions. Pt attended to simple sorting tasks for 5 minutes with 1 verbal cue for redirection. Decreased initiation noted during card game, as pt required mod-max verbal, visual and tactile cues to initiate during her turn. Pt 40% accurate with simple problem solving tasks , min  A.    FIM:  Comprehension Comprehension Mode: Auditory Comprehension: 3-Understands basic 50 - 74% of the time/requires cueing 25 - 50%  of the time Expression Expression Mode: Verbal Expression: 2-Expresses basic 25 - 49% of the time/requires cueing 50 - 75% of the time. Uses single words/gestures. Social Interaction Social Interaction: 4-Interacts appropriately 75 - 89% of the time - Needs redirection for appropriate language or to initiate interaction. Problem Solving Problem Solving: 2-Solves basic 25 - 49% of the time - needs direction more than half the time to initiate, plan or complete simple activities Memory Memory: 2-Recognizes or recalls 25 - 49% of the time/requires cueing 51 - 75% of the time  Pain Pain Assessment Pain Assessment: No/denies pain Pain Score: 6  Pain Type: Chronic pain Pain Location: Arm Pain Orientation: Left Pain Descriptors / Indicators: Grimacing;Aching Pain Onset: With Activity (arm abduction) Pain Intervention(s): Repositioned Multiple Pain Sites: No  Therapy/Group: Individual Therapy  Cadee Agro, Bernerd Pho 04/09/2015, 3:53 PM

## 2015-04-10 ENCOUNTER — Inpatient Hospital Stay (HOSPITAL_COMMUNITY): Payer: Medicare HMO | Admitting: Rehabilitation

## 2015-04-10 LAB — PROTIME-INR
INR: 2.65 — ABNORMAL HIGH (ref 0.00–1.49)
Prothrombin Time: 28.5 seconds — ABNORMAL HIGH (ref 11.6–15.2)

## 2015-04-10 LAB — GLUCOSE, CAPILLARY
GLUCOSE-CAPILLARY: 167 mg/dL — AB (ref 70–99)
GLUCOSE-CAPILLARY: 98 mg/dL (ref 70–99)
Glucose-Capillary: 127 mg/dL — ABNORMAL HIGH (ref 70–99)
Glucose-Capillary: 133 mg/dL — ABNORMAL HIGH (ref 70–99)

## 2015-04-10 MED ORDER — WARFARIN SODIUM 2 MG PO TABS
2.0000 mg | ORAL_TABLET | Freq: Once | ORAL | Status: AC
  Administered 2015-04-10: 2 mg via ORAL
  Filled 2015-04-10: qty 1

## 2015-04-10 NOTE — Progress Notes (Signed)
Physical Therapy Session Note  Patient Details  Name: Laura Melendez MRN: [id number] Date of Birth: [birth year]  Today's Date: 04/10/2015 PT Individual Time: 8921-1941 PT Individual Time Calculation (min): 30 min   Short Term Goals: Week 2:     Skilled Therapeutic Interventions/Progress Updates:   Pt received sitting on toilet in restroom with nurse tech assisting with toileting, bathing and dressing.  PT took over in order to address dynamic sitting balance while working on initiation of simple tasks.  Pt able to assist with adjusting bra once hooked by therapist.  Needed min A to orient shirt and mod A to don with tactile cues and simple verbal cues for increased initiation.  Performed standing at mod/max A level in order to have pt work on peri care.  Pt able to assist with front and back peri care with PT assisting to ensure full cleanliness.  Stood again at mod A to pull up pants and brief before transferring to w/c at max A level.  Assisted pt in front of sink to have her work on [place] and initiation of brushing teeth.  Needs mod to max verbal as well as intermittent tactile cues to complete task.  Pt verbalized "no" when asked if she wanted to work on breakfast but "yes" when asked if she wanted to walk.  Ambulated around unit in controlled and carpeted environment x 200' with min A level and no AD in order to further challenge balance.  Requires cues for increased R step length and upright posture with light facilitation for increased weight shift to the L.  Worked on therapeutic conversation and orientation questions during gait.  Pt oriented x 4 and able to state deficits from stroke.  While seated she was able to state kids names and grandkids names and ages during session.  Ambulated back to room and assisted to w/c with quick release belt and half lap tray.  Assisted pt out to elevator where husband was sitting for supervision until they both returned to room.    Therapy  Documentation Precautions:  Precautions Precautions: Fall Precaution Comments: right hemiparesis, delayed initiation Restrictions Weight Bearing Restrictions: No  Pain: Some grimacing when dressing R shoulder   Locomotion : Ambulation Ambulation/Gait Assistance: 4: Min assist   See FIM for current functional status  Therapy/Group: Individual Therapy  Denice Bors 04/10/2015, 11:39 AM

## 2015-04-10 NOTE — Progress Notes (Signed)
  [place] PHYSICAL MEDICINE & REHABILITATION     PROGRESS NOTE    Subjective/Complaints: She has no complaints this morning. Her husband is with her. There conversely normally  Objective: Vital Signs: Blood pressure 131/56, pulse 67, temperature 97.7 F (36.5 C), temperature source Oral, resp. rate 18, weight 101 lb 10.1 oz (46.1 kg), SpO2 95 %.   Well-developed well-nourished female in no acute distress. HEENT exam atraumatic, normocephalic, extraocular muscles are intact. Neck is supple. No jugular venous distention no thyromegaly. Chest clear to auscultation without increased work of breathing. Cardiac exam S1 and S2 are regular. Abdominal exam active bowel sounds, soft, nontender. Extremities no edema.   Assessment/Plan: 1. Functional deficits secondary to left frontal infarct   Medical Problem List and Plan: 1. 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 Thoracic aortic and supraceliac aortic thrombus/Anticoagulation: Pharmaceutical: Coumadin. To follow up with Dr. Bridgett Larsson in 6 month for repeat CTA chest/abd/pelvis.  Lab Results  Component Value Date   INR 2.65* 04/10/2015   INR 2.59* 04/09/2015   INR 3.01* 04/08/2015   3. Pain Management: Tylenol prn 4. Mood: Will start antidepressant to help with mood. Team to provide ego support. LCSW to follow for evaluation and support.  5. Neuropsych: This patient is capable of making decisions on his own behalf. 6. Skin/Wound Care: Routine pressure relief measures. Maintain adequate nutrition and hydration status  -allevyn dressing for sacrum.  7. Fluids/Electrolytes/Nutrition:     -encourage po fluids as possible  -dc'ed IVF as on D1 now---encourage PO  - Basic Metabolic Panel:    Component Value Date/Time   NA 141 04/07/2015 0718   NA 142 02/21/2015 0810   K 4.2 04/07/2015 0718   CL 104 04/07/2015 0718   CO2 26 04/07/2015 0718   BUN 12 04/07/2015 0718   BUN 16  02/21/2015 0810   CREATININE 0.55 04/07/2015 0718   CREATININE 0.66 09/06/2014 0925   GLUCOSE 125* 04/07/2015 0718   GLUCOSE 130* 02/21/2015 0810   CALCIUM 9.3 04/07/2015 0718    8. HTN: Monitor BP every 8 hours. Continue Lopressor bid, and procardia XL. Avoid hypotension. Blood pressure ranging 131/56-141/65. 9. Dyslipidemia: On lovaza and Pravachol.  10 DM type 2: Hgb A1c- 6.9. Was on metformin  500mg  bid at home. will hold until po intake better.  - Monitor BS ac/hs and use SSI for elevated BS.  CBG (last 3)   Recent Labs  04/09/15 1659 04/09/15 2056 04/10/15 0642  GLUCAP 86 131* 127*   Lab Results  Component Value Date   HGBA1C 6.9* 03/31/2015    11. Osteoporosis: Continue Evista. Resume fosamax at discharge.  12. Dysphagia:  .  -D1/thins per SLP  LOS (Days) 8 A FACE TO [place] 04/10/2015 8:43 AM

## 2015-04-10 NOTE — Progress Notes (Signed)
ANTICOAGULATION CONSULT NOTE - Follow Up Consult  Pharmacy Consult for coumadin Indication: arotic clot  No Known Allergies  Patient Measurements: Weight: 101 lb 10.1 oz (46.1 kg)  Vital Signs: Temp: 97.7 F (36.5 C) (04/24 0550) Temp Source: Oral (04/24 0550) BP: 131/56 mmHg (04/24 0550)  Labs:  Recent Labs  04/08/15 0802 04/09/15 0625 04/10/15 0447  LABPROT 31.4* 28.0* 28.5*  INR 3.01* 2.59* 2.65*    Estimated Creatinine Clearance: 44.9 mL/min (by C-G formula based on Cr of 0.55).   Medications:  Scheduled:  . calcium-vitamin D  1 tablet Oral BID WC  . FLUoxetine  10 mg Oral Daily  . insulin aspart  0-9 Units Subcutaneous TID WC  . lisinopril  2.5 mg Oral Daily  . metoprolol  50 mg Oral BID  . multivitamin with minerals  1 tablet Oral Daily  . NIFEdipine  60 mg Oral Daily  . omega-3 acid ethyl esters  1 g Oral BID  . pravastatin  40 mg Oral q1800  . raloxifene  60 mg Oral Daily  . Warfarin - Pharmacist Dosing Inpatient   Does not apply q1800   Infusions:    Assessment: 74 yo female with aortic clot remains therapeutic on coumadin.  INR today is 2.65   Goal of Therapy:  INR 2-3 Monitor platelets by anticoagulation protocol: Yes   Plan:  - coumadin 2mg  po x1. Anticipate patient to tolerate 2 mg daily  - INR in am  Albertina Parr, PharmD., BCPS Clinical Pharmacist Pager [phone]

## 2015-04-11 ENCOUNTER — Inpatient Hospital Stay (HOSPITAL_COMMUNITY): Payer: Medicare HMO | Admitting: Speech Pathology

## 2015-04-11 ENCOUNTER — Inpatient Hospital Stay (HOSPITAL_COMMUNITY): Payer: Medicare HMO

## 2015-04-11 ENCOUNTER — Ambulatory Visit: Payer: Medicare HMO | Admitting: Family Medicine

## 2015-04-11 LAB — GLUCOSE, CAPILLARY
GLUCOSE-CAPILLARY: 105 mg/dL — AB (ref 70–99)
Glucose-Capillary: 123 mg/dL — ABNORMAL HIGH (ref 70–99)
Glucose-Capillary: 154 mg/dL — ABNORMAL HIGH (ref 70–99)
Glucose-Capillary: 88 mg/dL (ref 70–99)

## 2015-04-11 LAB — PROTIME-INR
INR: 2.69 — ABNORMAL HIGH (ref 0.00–1.49)
Prothrombin Time: 28.8 seconds — ABNORMAL HIGH (ref 11.6–15.2)

## 2015-04-11 MED ORDER — WARFARIN SODIUM 2 MG PO TABS
2.0000 mg | ORAL_TABLET | Freq: Once | ORAL | Status: AC
  Administered 2015-04-11: 2 mg via ORAL
  Filled 2015-04-11: qty 1

## 2015-04-11 NOTE — Progress Notes (Signed)
[place] PHYSICAL MEDICINE & REHABILITATION     PROGRESS NOTE    Subjective/Complaints: Appears to have had some anxiety last night. Feeling well this morning.   ROS still somewhat limited due to language. Husband at bedside  Objective: Vital Signs: Blood pressure 134/51, pulse 56, temperature 98.5 F (36.9 C), temperature source Oral, resp. rate 17, weight 46.1 kg (101 lb 10.1 oz), SpO2 96 %. No results found. No results for input(s): WBC, HGB, HCT, PLT in the last 72 hours. No results for input(s): NA, K, CL, GLUCOSE, BUN, CREATININE, CALCIUM in the last 72 hours.  Invalid input(s): CO CBG (last 3)   Recent Labs  04/10/15 1702 04/10/15 2054 04/11/15 0635  GLUCAP 98 133* 123*    Wt Readings from Last 3 Encounters:  04/05/15 46.1 kg (101 lb 10.1 oz)  02/15/15 45.813 kg (101 lb)  01/31/15 44.7 kg (98 lb 8.7 oz)    Physical Exam:  Constitutional: She appears alert, no distress. HENT: oral mucosa moist/pink Head: Normocephalic and atraumatic.  Eyes: Conjunctivae are normal. Pupils are equal, round, and reactive to light.  Neck: Normal range of motion. Neck supple.  Cardiovascular: Normal rate and regular rhythm.  Respiratory: Effort normal and breath sounds normal. No respiratory distress. She has no wheezes.  GI: Soft. Bowel sounds are normal.  Musculoskeletal: She exhibits no edema or tenderness.  Neurological: She is alert. RUE:  Language improving--initiating conversation/sentences---better when given a lead in/cue . RUE with spontaneous movement/voluntary effort now---still inconsistent. Occasional volitional movement also een in leg. DTR's 3+. Right central 7 and tongue deviation.   No withdraw to pain RUE and RLE.  M/S: left shoulder tender with Passive ABD/IR/ER Psych: more dynamic Skin: Skin is warm and dry. No breakdown  Assessment/Plan: 1. Functional deficits secondary to left frontal infarct which require 3+ hours per day of interdisciplinary  therapy in a comprehensive inpatient rehab setting. Physiatrist is providing close team supervision and 24 hour management of active medical problems listed below. Physiatrist and rehab team continue to assess barriers to discharge/monitor patient progress toward functional and medical goals.  FIM: FIM - Bathing Bathing Steps Patient Completed: Chest, Right Arm, Abdomen, Front perineal area, Right upper leg, Left upper leg Bathing: 3: Mod-Patient completes 5-7 6f 10 parts or 50-74%  FIM - Upper Body Dressing/Undressing Upper body dressing/undressing steps patient completed: Thread/unthread left sleeve of pullover shirt/dress, Put head through opening of pull over shirt/dress Upper body dressing/undressing: 3: Mod-Patient completed 50-74% of tasks FIM - Lower Body Dressing/Undressing Lower body dressing/undressing: 1: Total-Patient completed less than 25% of tasks  FIM - Toileting Toileting steps completed by patient: Performs perineal hygiene, Adjust clothing after toileting Toileting: 3: Mod-Patient completed 2 of 3 steps  FIM - Radio producer Devices: Elevated toilet seat Toilet Transfers: 2-From toilet/BSC: Max A (lift and lower assist)  FIM - Control and instrumentation engineer Devices: Best boy: 2: Chair or W/C > Bed: Max A (lift and lower assist)  FIM - Locomotion: Wheelchair Distance: 100 Locomotion: Wheelchair: 0: Activity did not occur FIM - Locomotion: Ambulation Locomotion: Ambulation Assistive Devices: Other (comment) (no AD) Ambulation/Gait Assistance: 4: Min assist Locomotion: Ambulation: 4: Travels 150 ft or more with minimal assistance (Pt.>75%)  Comprehension Comprehension Mode: Auditory Comprehension: 3-Understands basic 50 - 74% of the time/requires cueing 25 - 50%  of the time  Expression Expression Mode: Verbal Expression: 2-Expresses basic 25 - 49% of the time/requires cueing 50 - 75% of the time.  Uses  single words/gestures.  Social Interaction Social Interaction: 4-Interacts appropriately 75 - 89% of the time - Needs redirection for appropriate language or to initiate interaction.  Problem Solving Problem Solving: 2-Solves basic 25 - 49% of the time - needs direction more than half the time to initiate, plan or complete simple activities  Memory Memory: 2-Recognizes or recalls 25 - 49% of the time/requires cueing 51 - 75% of the time  Medical Problem List and Plan: 1. 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 Thoracic aortic and supraceliac aortic thrombus/Anticoagulation: Pharmaceutical: Coumadin. To follow up with Dr. Bridgett Larsson in 6 month for repeat CTA chest/abd/pelvis.  3. Pain Management: Tylenol prn 4. Mood:   prozac to help with mood. Team to provide ego support. LCSW to follow for evaluation and support.  5. Neuropsych: This patient is capable of making decisions on his own behalf. 6. Skin/Wound Care: Routine pressure relief measures. Maintain adequate nutrition and hydration status  -allevyn dressing for sacrum.  7. Fluids/Electrolytes/Nutrition:     -encourage po fluids as possible  -dc'ed IVF as on D1 now--PO still needs some work. Husband encourages  -bmet normal yesterday 8. HTN: Monitor BP every 8 hours. Continue Lopressor bid, and procardia XL. Avoid hypotension.  9. Dyslipidemia: On lovaza and Pravachol.  10 DM type 2: Hgb A1c- 6.9. Was on metformin  500mg  bid at home. will hold until po intake better.  - Monitor BS ac/hs and use SSI for elevated BS.  11. Osteoporosis: Continue Evista. Resume fosamax at discharge.  12. Dysphagia:  .  -D1/thins per SLP  LOS (Days) 9 A FACE TO FACE EVALUATION WAS PERFORMED  Laura Melendez T 04/11/2015 7:51 AM

## 2015-04-11 NOTE — Progress Notes (Signed)
ANTICOAGULATION CONSULT NOTE - Follow Up Consult  Pharmacy Consult for coumadin Indication: aortic clot  No Known Allergies  Patient Measurements: Weight: 101 lb 10.1 oz (46.1 kg) Heparin Dosing Weight:   Vital Signs: Temp: 98.5 F (36.9 C) (04/25 0556) Temp Source: Oral (04/25 0556) BP: 134/51 mmHg (04/25 0556) Pulse Rate: 56 (04/25 0556)  Labs:  Recent Labs  04/09/15 0625 04/10/15 0447  LABPROT 28.0* 28.5*  INR 2.59* 2.65*    Estimated Creatinine Clearance: 44.9 mL/min (by C-G formula based on Cr of 0.55).   Medications:  Scheduled:  . calcium-vitamin D  1 tablet Oral BID WC  . FLUoxetine  10 mg Oral Daily  . insulin aspart  0-9 Units Subcutaneous TID WC  . lisinopril  2.5 mg Oral Daily  . metoprolol  50 mg Oral BID  . multivitamin with minerals  1 tablet Oral Daily  . NIFEdipine  60 mg Oral Daily  . omega-3 acid ethyl esters  1 g Oral BID  . pravastatin  40 mg Oral q1800  . raloxifene  60 mg Oral Daily  . 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  INR today is 2.69.  Goal of Therapy:  INR 2-3 Monitor platelets by anticoagulation protocol: Yes   Plan:  - coumadin 2 mg po x1 - INR in am  Liborio Saccente, [name] 04/11/2015,8:25 AM

## 2015-04-11 NOTE — Progress Notes (Signed)
Physical Therapy Weekly Progress Note  Patient Details  Name: Laura Melendez MRN: [id number] Date of Birth: Apr 29, 1941  Beginning of progress report period: April 03, 2015 End of progress report period: April 11, 2015  Today's Date: 04/11/2015 PT Individual Time: 0800-0900 PT Individual Time Calculation (min): 60 min    Patient has met 4 of 5 short term goals.  Pt making improvements in sustained attention, initiation, apraxia since evaluation leading to decreased assist for functional mobility. Pt continues to demonstrate variable performance based on fatigue and environment and will benefit from continued work at rehab level to enhance consistency of erformance  Patient continues to demonstrate the following deficits: decreased balance, decreased coordination, impaired problem solving, impaired initiation, decreased attention and therefore will continue to benefit from skilled PT intervention to enhance overall performance with balance, postural control, attention, awareness and coordination.  Patient progressing toward long term goals..  Continue plan of care.  PT Short Term Goals Week 1:  PT Short Term Goal 1 (Week 1): Pt will move supine<>sit w/ ModA consistently PT Short Term Goal 1 - Progress (Week 1): Met PT Short Term Goal 2 (Week 1): Pt will transfer bed<>w/c w/ ModA consistently PT Short Term Goal 2 - Progress (Week 1): Met PT Short Term Goal 3 (Week 1): Pt will initiate w/c propulsion w/ hemi technique PT Short Term Goal 3 - Progress (Week 1): Met PT Short Term Goal 4 (Week 1): Pt will sit<>stand w/ ModA PT Short Term Goal 4 - Progress (Week 1): Progressing toward goal PT Short Term Goal 5 (Week 1): Pt will ambulate w/ assist of 1 PT Short Term Goal 5 - Progress (Week 1): Met Week 2:  PT Short Term Goal 1 (Week 2): Pt will propel w/c 100' w/ hemi technique PT Short Term Goal 2 (Week 2): Pt will ambulate with MinA w/ LRAD consistently PT Short Term Goal 3 (Week 2): Pt will  initiate sit>stand w/ min verbal cueing consistently PT Short Term Goal 4 (Week 2): Pt will complete all bed mobility with MinA and min cueing  Skilled Therapeutic Interventions/Progress Updates:    Session 1: Pt received seated in w/c, agreeable to participate in therapy. Session focused on standing balance, gait, initiation. Worked on ambulation w/ straight cane and w/ no AD. Pt primarily uses cane for initiation of sit>stand but does not utilize cane effectively when ambulating. With no AD, pt had difficulty initiating sit>stand then ambulated with same amount of assistance (Min-ModA) as w/ cane. Worked on initiation of sit>stand and use of knee/hip extension to come fully to standing. Pt benefited from tactile cueing for hip/knee extension in standing. Instructed pt in propelling w/c w/ use of hemi-technique, which pt achieved at very low speed and MinA progressing to [place] as pt fatigued. Session ended in pt's room, where pt was left seated in w/c w/ husband present w/ all needs within reach.    Therapy Documentation Precautions:  Precautions Precautions: Fall Precaution Comments: right hemiparesis, delayed initiation Restrictions Weight Bearing Restrictions: No Pain: Pain Assessment Pain Assessment: No/denies pain  See FIM for current functional status  Therapy/Group: Individual Therapy  Rada Hay  Rada Hay, PT, DPT 04/11/2015, 7:40 AM

## 2015-04-11 NOTE — Progress Notes (Signed)
Speech Language Pathology Weekly Progress and Session Note  Patient Details  Name: Laura Melendez MRN: [id number] Date of Birth: August 16, 1941  Beginning of progress report period: April 04, 2015 End of progress report period: April 11, 2015  Today's Date: 04/11/2015 SLP Individual Time: 1330 (make up session )-1400 SLP Individual Time Calculation (min): 30 min  Short Term Goals: Week 1: SLP Short Term Goal 1 (Week 1): Patient will consume least restrictive diet without overt s/s of aspiration and while demonstrating a timely swallow initiation given Mod A verbal, visual, and tactile in 75% of opportunities. SLP Short Term Goal 1 - Progress (Week 1): Met SLP Short Term Goal 2 (Week 1): Patient will verbally express wants and needs in functional communication opportunities given Mod A verbal cues in 75% of opportunities. SLP Short Term Goal 2 - Progress (Week 1): Progressing toward goal SLP Short Term Goal 3 (Week 1): Patient will demonstrate basic problem-solving with functional tasks given Max A verbal, visual, and tactile cues in 75% of opportunities. SLP Short Term Goal 3 - Progress (Week 1): Met SLP Short Term Goal 4 (Week 1): Patient will demonstrate selective attention in a mildly distracting environment for ~5 minutes given Max A verbal, visual, and tactile cues. SLP Short Term Goal 4 - Progress (Week 1): Met SLP Short Term Goal 5 (Week 1): Patient will identify 2 new cognitive deficits and 2 new physical deficits given Mod A question cues. SLP Short Term Goal 5 - Progress (Week 1): Progressing toward goal SLP Short Term Goal 6 (Week 1): Patient will demonstrate initiation with functional tasks given Mod A verbal, visual, and tactile cues in 75% of opportunities. SLP Short Term Goal 6 - Progress (Week 1): Progressing toward goal    New Short Term Goals: Week 2: SLP Short Term Goal 1 (Week 2): Patient will consume least restrictive diet without overt s/s of aspiration and while  demonstrating a timely swallow initiation given Mod A verbal and visual in 75% of opportunities. SLP Short Term Goal 2 (Week 2): Patient will verbally express wants and needs in functional communication opportunities given Mod A verbal cues in 75% of opportunities. SLP Short Term Goal 3 (Week 2): Patient will demonstrate basic problem-solving with functional tasks given Max A verbal and visual cues in 75% of opportunities. SLP Short Term Goal 4 (Week 2): Patient will demonstrate selective attention in a mildly distracting environment for ~5 minutes given Max A verbal and visual cues. SLP Short Term Goal 5 (Week 2): Patient will identify 2 new cognitive deficits and 2 new physical deficits given Mod A question cues. SLP Short Term Goal 6 (Week 2): Patient will demonstrate initiation with functional tasks given Mod A verbal, visual, and tactile cues in 75% of opportunities.  Weekly Progress Updates:  Pt made slow functional gains this reporting period and has met 3 out of 6 short term goals.  Pt currently requires mod to max assist multimodal cues during basic, familiar cognitive-linguistic tasks due to decreased initiation, decreased selective attention to tasks, and decreased functional problem solving.  Pt is currently consuming dys 1 textures with thin liquids with mod assist multimodal cues to monitor and correct oral holding in order to initiate a timely swallow.  Pt would continue to benefit from skilled ST while inpatient in order to maximize functional independence and reduce burden of care prior to discharge.  Pt and family education is ongoing.  Anticipate that pt will need extensive 24/7 supervision, assist for medication and financial  management, and ST follow up at next level of care.     Intensity: Minumum of 1-2 x/day, 30 to 90 minutes Frequency: 3 to 5 out of 7 days Duration/Length of Stay: 3 weeks Treatment/Interventions: Environmental controls;Dysphagia/aspiration precaution  training;Cueing hierarchy;Cognitive remediation/compensation;Functional tasks;Internal/external aids;Therapeutic Exercise;Therapeutic Activities;Speech/Language facilitation;Patient/family education   Daily Session  Skilled Therapeutic Interventions: Pt was seen for skilled ST targeting goals for dysphagia and cognition.  Upon arrival, pt was seated upright in bed finishing lunch.  Pt presented with significantly delayed processing and required mod assist verbal and tactile cues to initiate and sequence self feeding tasks.  Pt consumed dys 1 textures and thin liquids with mod assist verbal cues to initiate a timely swallow.  No oral holding was evident with dys 1 textures or thin liquids.  Pt required max assist to convey when she was finished with meal.  SLP provided mod-max assist verbal and visual cues for basic problem solving when completing oral care upon meal completion.  No significant oral residue was noted in the oral cavity post swallow.  Goals updated on this date to reflect current progress and plan of care.          FIM:  Comprehension Comprehension Mode: Auditory Comprehension: 3-Understands basic 50 - 74% of the time/requires cueing 25 - 50%  of the time Expression Expression Mode: Verbal Expression: 2-Expresses basic 25 - 49% of the time/requires cueing 50 - 75% of the time. Uses single words/gestures. Social Interaction Social Interaction: 4-Interacts appropriately 75 - 89% of the time - Needs redirection for appropriate language or to initiate interaction. Problem Solving Problem Solving: 2-Solves basic 25 - 49% of the time - needs direction more than half the time to initiate, plan or complete simple activities Memory Memory: 2-Recognizes or recalls 25 - 49% of the time/requires cueing 51 - 75% of the time  Pain Pain Assessment Pain Assessment: No/denies pain  Therapy/Group: Individual Therapy  Lesle Faron, Selinda Orion 04/11/2015, 1:59 PM

## 2015-04-11 NOTE — Progress Notes (Signed)
Occupational Therapy Weekly Progress Note  Patient Details  Name: Laura Melendez MRN: [id number] Date of Birth: November 01, 1941  Beginning of progress report period: April 03, 2015 End of progress report period: April 11, 2015  Today's Date: 04/11/2015 OT Individual Time: 1030-1130 OT Individual Time Calculation (min): 60 min    Patient has met 3 of 5 short term goals.  Pt continues to progress with functional goals relating to bathing and dressing skills and is demonstrating improved dynamic sitting balance as well as incorporation of RUE at diminished level.  Patient continues to demonstrate the following deficits: Right UE hemiparesis,  Impaired endurance, impaired transfer skills, impaired dynamic sitting/standing balance, impaired attention, decreased communication, and therefore will continue to benefit from skilled OT intervention to enhance overall performance with BADL.  Patient progressing toward long term goals..  Continue plan of care.  OT Short Term Goals Week 1:  OT Short Term Goal 1 (Week 1): Pt will feed self with LUE with moderate assist OT Short Term Goal 1 - Progress (Week 1): Met OT Short Term Goal 2 (Week 1): Pt. will sit EOB for 10 minutes with no LOB OT Short Term Goal 2 - Progress (Week 1): Progressing toward goal OT Short Term Goal 3 (Week 1): Pt will bathe UB/LB with mod assist OT Short Term Goal 3 - Progress (Week 1): Met OT Short Term Goal 4 (Week 1): Pt. will dress UB/:LB with mod assist OT Short Term Goal 4 - Progress (Week 1): Progressing toward goal OT Short Term Goal 5 (Week 1): Pt. will dress UB with mod assist OT Short Term Goal 5 - Progress (Week 1): Met Week 2:  OT Short Term Goal 1 (Week 2): Pt will complete lower body dressing using AE, prn, with min assist OT Short Term Goal 2 (Week 2): Pt will demo ability to complete BUE HEP with supervision OT Short Term Goal 3 (Week 2): Pt will complete 1 grooming task using right hand with min assist OT Short  Term Goal 4 (Week 2): Pt will bathe 8 of 10 body parts with min verbal cues to attend to affected side OT Short Term Goal 5 (Week 2): Caregiver will demonstrate ability to provide tactile cues during functional transfers in/out of bed.  Skilled Therapeutic Interventions/Progress Updates: ADL-retraining with focus on improved attention, improved gross/fine motor control of RUE, improved transfers, improved dynamic sitting and standing balance.   Pt received seated in her w/c with husband in room.   Pt verbalized fatigue and initially deferred treatment but with encouragement from her husband agreed to shower level bathing and dressing.   Pt ambulated from w/c to bathroom with min assist using SPC, tactile cues to advance right leg and verbal cues to scan room for target destination while ambulating.   Pt transferred to tub bench with multimodal cues to reach to grab bar and sit.   Pt able to wash chest, stomach, right arm, upper and lower legs while seated and then stood to wash peri-area with mod assist to maintain standing balance as husband observed.    Pt sat back on bench and dressed upper body with overall mod assist and lower body with max assist although lifting legs when cued to allow therapist to lace pants.    Pt returned to w/c and groomed at sink, attempting to brush her hair using her right hand during this session, with min assist to lift elbow and abduct shoulder.  Pt requested rest at end of session and was  assisted back in bed with overall mod assist to lift both legs as husband observed therapist.    Therapy Documentation Precautions:  Precautions Precautions: Fall Precaution Comments: right hemiparesis, delayed initiation Restrictions Weight Bearing Restrictions: No  Vital Signs: Therapy Vitals Temp: 97.8 F (36.6 C) Temp Source: Oral Pulse Rate: 66 Resp: 18 BP: (!) 125/53 mmHg Patient Position (if appropriate): Sitting Oxygen Therapy SpO2: 98 % O2 Device: Not Delivered    Pain:  No/denies pain  See FIM for current functional status  Therapy/Group: Individual Therapy  Tryphena Perkovich 04/11/2015, 6:38 PM

## 2015-04-11 NOTE — Progress Notes (Signed)
Awake most of night, at one point reports feeling scared. Husband at bedside. Poor PO intake at lunch and supper, per husband she eats and drinks much better in the morning. Laura Melendez A

## 2015-04-11 NOTE — Progress Notes (Signed)
Speech Language Pathology Daily Session Note  Patient Details  Name: Laura Melendez MRN: [id number] Date of Birth: July 16, 1941  Today's Date: 04/11/2015 SLP Individual Time: 0900-0930 SLP Individual Time Calculation (min): 30 min  Short Term Goals: Week 1: SLP Short Term Goal 1 (Week 1): Patient will consume least restrictive diet without overt s/s of aspiration and while demonstrating a timely swallow initiation given Mod A verbal, visual, and tactile in 75% of opportunities. SLP Short Term Goal 1 - Progress (Week 1): Progressing toward goal SLP Short Term Goal 2 (Week 1): Patient will verbally express wants and needs in functional communication opportunities given Mod A verbal cues in 75% of opportunities. SLP Short Term Goal 2 - Progress (Week 1): Progressing toward goal SLP Short Term Goal 3 (Week 1): Patient will demonstrate basic problem-solving with functional tasks given Max A verbal, visual, and tactile cues in 75% of opportunities. SLP Short Term Goal 3 - Progress (Week 1): Progressing toward goal SLP Short Term Goal 4 (Week 1): Patient will demonstrate selective attention in a mildly distracting environment for ~5 minutes given Max A verbal, visual, and tactile cues. SLP Short Term Goal 4 - Progress (Week 1): Progressing toward goal SLP Short Term Goal 5 (Week 1): Patient will identify 2 new cognitive deficits and 2 new physical deficits given Mod A question cues. SLP Short Term Goal 5 - Progress (Week 1): Progressing toward goal SLP Short Term Goal 6 (Week 1): Patient will demonstrate initiation with functional tasks given Mod A verbal, visual, and tactile cues in 75% of opportunities. SLP Short Term Goal 6 - Progress (Week 1): Progressing toward goal  Skilled Therapeutic Interventions: Skilled treatment session focused on communication goals. Pt seen in room for ST tx session, while seated upright in w/c. Upon arrival, pt and spouse were waiting on nursing to assist with  toileting. SLP facilitated ADL with mod-max cues required for pt to verbalize steps necessary to going to the bathroom (remove arm tray, remove footrests, wheel toward bathroom, etc). Husband left the room at that time.  Following toileting, nursing left the room and SLP provided mod-max verbal visual and tactile cues for pt to wash hands. Pt able to answer simple personal questions and name specific items with min delay, however, max utterance elicited was 2-3 words. RN arrived to provide medications. Vitamin was given whole in puree, while the remainder was given crushed in puree. Pt notably distracted while RN present, with functional task ceased while RN present. Mod-max cues required to redirect pt attention to task.   FIM:  Comprehension Comprehension Mode: Auditory Comprehension: 3-Understands basic 50 - 74% of the time/requires cueing 25 - 50%  of the time Expression Expression Mode: Verbal Expression: 2-Expresses basic 25 - 49% of the time/requires cueing 50 - 75% of the time. Uses single words/gestures. Social Interaction Social Interaction: 4-Interacts appropriately 75 - 89% of the time - Needs redirection for appropriate language or to initiate interaction. Problem Solving Problem Solving: 2-Solves basic 25 - 49% of the time - needs direction more than half the time to initiate, plan or complete simple activities Memory Memory: 2-Recognizes or recalls 25 - 49% of the time/requires cueing 51 - 75% of the time  Pain Pain Assessment Pain Assessment: No/denies pain  Therapy/Group: Individual Therapy   Hazelynn Mckenny B. [place], [facility] [place], [place]  Shonna Chock 04/11/2015, 12:25 PM

## 2015-04-12 ENCOUNTER — Inpatient Hospital Stay (HOSPITAL_COMMUNITY): Payer: Medicare HMO

## 2015-04-12 ENCOUNTER — Inpatient Hospital Stay (HOSPITAL_COMMUNITY): Payer: Medicare HMO | Admitting: *Deleted

## 2015-04-12 ENCOUNTER — Inpatient Hospital Stay (HOSPITAL_COMMUNITY): Payer: Medicare HMO | Admitting: Occupational Therapy

## 2015-04-12 ENCOUNTER — Inpatient Hospital Stay (HOSPITAL_COMMUNITY): Payer: Medicare HMO | Admitting: Speech Pathology

## 2015-04-12 LAB — BASIC METABOLIC PANEL
Anion gap: 11 (ref 5–15)
BUN: 12 mg/dL (ref 6–23)
CHLORIDE: 105 mmol/L (ref 96–112)
CO2: 22 mmol/L (ref 19–32)
CREATININE: 0.54 mg/dL (ref 0.50–1.10)
Calcium: 9.6 mg/dL (ref 8.4–10.5)
GFR calc Af Amer: >90 mL/min (ref >90)
Glucose, Bld: 126 mg/dL — ABNORMAL HIGH (ref 70–99)
Potassium: 4.5 mmol/L (ref 3.5–5.1)
Sodium: 138 mmol/L (ref 135–145)

## 2015-04-12 LAB — PROTIME-INR
INR: 2.98 — ABNORMAL HIGH (ref 0.00–1.49)
PROTHROMBIN TIME: 31.2 s — AB (ref 11.6–15.2)

## 2015-04-12 LAB — GLUCOSE, CAPILLARY
GLUCOSE-CAPILLARY: 120 mg/dL — AB (ref 70–99)
Glucose-Capillary: 110 mg/dL — ABNORMAL HIGH (ref 70–99)
Glucose-Capillary: 97 mg/dL (ref 70–99)
Glucose-Capillary: 105 mg/dL — ABNORMAL HIGH (ref 70–99)

## 2015-04-12 MED ORDER — WARFARIN SODIUM 1 MG PO TABS
1.0000 mg | ORAL_TABLET | Freq: Once | ORAL | Status: AC
  Administered 2015-04-12: 1 mg via ORAL
  Filled 2015-04-12: qty 1

## 2015-04-12 MED ORDER — METHYLPHENIDATE HCL 5 MG PO TABS
5.0000 mg | ORAL_TABLET | Freq: Two times a day (BID) | ORAL | Status: DC
  Administered 2015-04-13 – 2015-04-22 (×20): 5 mg via ORAL
  Filled 2015-04-12 (×21): qty 1

## 2015-04-12 MED ORDER — GLUCERNA SHAKE PO LIQD
237.0000 mL | Freq: Three times a day (TID) | ORAL | Status: DC
  Administered 2015-04-12 – 2015-04-20 (×17): 237 mL via ORAL

## 2015-04-12 NOTE — Progress Notes (Addendum)
ANTICOAGULATION CONSULT NOTE - Follow Up Consult  Pharmacy Consult for coumadin Indication: aortic clot  No Known Allergies  Patient Measurements: Weight: 101 lb 10.1 oz (46.1 kg) Heparin Dosing Weight:   Vital Signs: Temp: 97.8 F (36.6 C) (04/26 0520) Temp Source: Oral (04/26 0520) BP: 140/58 mmHg (04/26 0520) Pulse Rate: 64 (04/26 0520)  Labs:  Recent Labs  04/10/15 0447 04/11/15 1120  LABPROT 28.5* 28.8*  INR 2.65* 2.69*    Estimated Creatinine Clearance: 44.9 mL/min (by C-G formula based on Cr of 0.55).   Medications:  Scheduled:  . calcium-vitamin D  1 tablet Oral BID WC  . FLUoxetine  10 mg Oral Daily  . insulin aspart  0-9 Units Subcutaneous TID WC  . lisinopril  2.5 mg Oral Daily  . metoprolol  50 mg Oral BID  . multivitamin with minerals  1 tablet Oral Daily  . NIFEdipine  60 mg Oral Daily  . omega-3 acid ethyl esters  1 g Oral BID  . pravastatin  40 mg Oral q1800  . raloxifene  60 mg Oral Daily  . 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  INR today is up to 2.98.  Goal of Therapy:  INR 2-3 Monitor platelets by anticoagulation protocol: Yes   Plan:  - coumadin 1 mg po x1 (patient may need 2 mg of coumadin most days and 1 mg few days a week) - INR in am  Mykenna Viele, [name] 04/12/2015,8:24 AM

## 2015-04-12 NOTE — Progress Notes (Signed)
Occupational Therapy Session Note  Patient Details  Name: Laura Melendez MRN: [id number] Date of Birth: 1941-11-05  Today's Date: 04/12/2015 OT Individual Time: 1330-1400 OT Individual Time Calculation (min): 30 min    Short Term Goals: Week 1:  OT Short Term Goal 1 (Week 1): Pt will feed self with LUE with moderate assist OT Short Term Goal 1 - Progress (Week 1): Met OT Short Term Goal 2 (Week 1): Pt. will sit EOB for 10 minutes with no LOB OT Short Term Goal 2 - Progress (Week 1): Progressing toward goal OT Short Term Goal 3 (Week 1): Pt will bathe UB/LB with mod assist OT Short Term Goal 3 - Progress (Week 1): Met OT Short Term Goal 4 (Week 1): Pt. will dress UB/:LB with mod assist OT Short Term Goal 4 - Progress (Week 1): Progressing toward goal OT Short Term Goal 5 (Week 1): Pt. will dress UB with mod assist OT Short Term Goal 5 - Progress (Week 1): Met  Skilled Therapeutic Interventions/Progress Updates:    Pt seen for 1:1 OT session with focus on functional transfers, postural control, NMR to RUE, and command following. Pt received sitting on toilet with RN present. Pt stood with min A during toileting with total A for hygiene and clothing management. Competed stand pivot transfer toilet>w/c with mod A. Pt completed hand hygiene at sink with max A to use RUE during activity. Engaged in light weight bearing to RUE while in standing and reaching across midline. Pt tolerated standing 2-3 min before requiring rest break. At end of session pt requesting to return to bed, therefore completed squat pivot transfer w/c>bed with mod A. Pt required max A for sit>supine. Pt left supine in bed with all needs in reach.   Therapy Documentation Precautions:  Precautions Precautions: Fall Precaution Comments: right hemiparesis, delayed initiation Restrictions Weight Bearing Restrictions: No General:   Vital Signs:  Pain: No indication of pain  See FIM for current functional  status  Therapy/Group: Individual Therapy  Duayne Cal 04/12/2015, 2:58 PM

## 2015-04-12 NOTE — Progress Notes (Signed)
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  DOCUMENTATION CODES Per approved criteria  -Non-severe (moderate) malnutrition in the context of chronic illness   INTERVENTION: Provide Glucerna Shake po TID, each supplement provides 220 kcal and 10 grams of protein.  Provide nourishment snacks (yogurt). Ordered.  Encourage adequate PO intake.  NUTRITION DIAGNOSIS: Inadequate oral intake related to dislike of food as evidenced by meal completion 15-70%.   Goal: Pt to meet >/= 90% of their estimated nutrition needs   Monitor:  PO intake, weight trends, labs, I/O's  Reason for Assessment: MD consult  74 y.o. 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  Pt reports her appetite is fine, however reports she does not like her diet. Current meal completion is 15-70%. PTA she reports eating well with 3 meals a day. Weight has been stable. Pt is agreeable to Glucerna Shake to aid in caloric and protein needs. RD to order. Spoke with PA, about food preferences. RD to make changes in [facility] as well as order yogurt for nourishment snacks. Pt was encouraged to eat her food at meals.  Nutrition Focused Physical Exam:  Subcutaneous Fat:  Orbital Region: N/A Upper Arm Region: Moderate depletion Thoracic and Lumbar Region: Moderate depletion  Muscle:  Temple Region: N/A Clavicle Bone Region: Moderate to severe depletion Clavicle and Acromion Bone Region: Moderate to severe depletion Scapular Bone Region: N/A Dorsal Hand: N/A Patellar Region: Moderate to Severe  depletion Anterior Thigh Region: Severe depletion Posterior Calf Region: Moderate to severe depletion  Edema: none  Labs and medications reviewed.  Height: Ht Readings from Last 1 Encounters:  02/15/15 5\' 1"  (1.549 m)    Weight: Wt Readings from Last 1 Encounters:  04/05/15 101 lb 10.1 oz (46.1 kg)    Ideal Body Weight: 105 lbs  % Ideal Body Weight: 96%  Wt Readings from Last 10 Encounters:  04/05/15 101 lb 10.1 oz (46.1 kg)  02/15/15 101 lb (45.813 kg)  01/31/15 98 lb 8.7 oz (44.7 kg)  12/27/14 101 lb (45.813 kg)  09/28/14 102 lb (46.267 kg)  03/23/14 103 lb (46.72 kg)  02/24/14 104 lb (47.174 kg)  01/01/14 102 lb (46.267 kg)  12/23/13 104 lb (47.174 kg)  09/24/13 103 lb (46.72 kg)    Usual Body Weight: 101 lbs  % Usual Body Weight: 100%  BMI:  Body mass index is 19.21 kg/(m^2).  Estimated Nutritional Needs: Kcal: 1600-1800 Protein: 70-85 grams Fluid: 1.6 - 1.8 L/day  Skin: Intact  Diet Order: DIET - DYS 1 Room service appropriate?: Yes; Fluid consistency:: Thin  EDUCATION NEEDS: -No education needs identified at this time   Intake/Output Summary (Last 24 hours) at 04/12/15 1053 Last data filed at 04/12/15 0745  Gross per 24 hour  Intake    300 ml  Output      0 ml  Net    300 ml    Last BM: 4/24  Labs:   Recent Labs Lab 04/07/15 0718 04/12/15 0810  NA 141 138  K 4.2 4.5  CL 104 105  CO2 26 22  BUN 12 12  CREATININE 0.55 0.54  CALCIUM  9.3 9.6  GLUCOSE 125* 126*    CBG (last 3)   Recent Labs  04/11/15 1741 04/11/15 2141 04/12/15 0656  GLUCAP 88 105* 120*    Scheduled Meds: . calcium-vitamin D  1 tablet Oral BID WC  . FLUoxetine  10 mg Oral Daily  . insulin aspart  0-9 Units Subcutaneous TID WC  . lisinopril  2.5 mg Oral Daily  . metoprolol  50 mg Oral BID  . multivitamin with minerals  1 tablet Oral Daily  . NIFEdipine  60 mg Oral Daily  . omega-3 acid ethyl esters  1 g Oral BID  . pravastatin  40 mg Oral q1800  .  raloxifene  60 mg Oral Daily  . warfarin  1 mg Oral ONCE-1800  . Warfarin - Pharmacist Dosing Inpatient   Does not apply q1800    Continuous Infusions:   Past Medical History  Diagnosis Date  . Essential hypertension, benign   . Type 2 diabetes mellitus   . Osteoporosis 06/2008  . Stroke     Past Surgical History  Procedure Laterality Date  . Ankle surgery Left 2004    otif  . Abdominal hysterectomy    . Anterior and posterior repair N/A 03/11/2013    Procedure: ANTERIOR (CYSTOCELE) AND POSTERIOR REPAIR (RECTOCELE);  Surgeon: Linda Hedges, DO;  Location: [facility];  Service: Gynecology;  Laterality: N/A;  with sacrospinous ligament fixation bilateral  . Colonoscopy  2009    negative  . Cataract extraction w/phaco Right 12/27/2014    Procedure: CATARACT EXTRACTION PHACO AND INTRAOCULAR LENS PLACEMENT RIGHT EYE;  Surgeon: Tonny Branch, MD;  Location: [facility];  Service: Ophthalmology;  Laterality: Right;  CDE:10.63  . Cataract extraction w/phaco Left 01/13/2015    Procedure: CATARACT EXTRACTION PHACO AND INTRAOCULAR LENS PLACEMENT LEFT EYE;  Surgeon: Tonny Branch, MD;  Location: [facility];  Service: Ophthalmology;  Laterality: Left;  CDE:18.87  . Cholecystectomy    . Tee without cardioversion N/A 04/01/2015    Procedure: TRANSESOPHAGEAL ECHOCARDIOGRAM (TEE);  Surgeon: Sanda Klein, MD;  Location: [facility];  Service: Cardiovascular;  Laterality: N/A;    Kallie Locks, MS, RD, LDN Pager # [phone] After hours/ weekend pager # [phone]

## 2015-04-12 NOTE — Progress Notes (Signed)
[place] PHYSICAL MEDICINE & REHABILITATION     PROGRESS NOTE    Subjective/Complaints: Continues to progress. Happy about increased mobility. No new complains.   ROS still somewhat limited due to language. Husband at bedside  Objective: Vital Signs: Blood pressure 140/58, pulse 64, temperature 97.8 F (36.6 C), temperature source Oral, resp. rate 18, weight 46.1 kg (101 lb 10.1 oz), SpO2 99 %. No results found. No results for input(s): WBC, HGB, HCT, PLT in the last 72 hours. No results for input(s): NA, K, CL, GLUCOSE, BUN, CREATININE, CALCIUM in the last 72 hours.  Invalid input(s): CO CBG (last 3)   Recent Labs  04/11/15 1741 04/11/15 2141 04/12/15 0656  GLUCAP 88 105* 120*    Wt Readings from Last 3 Encounters:  04/05/15 46.1 kg (101 lb 10.1 oz)  02/15/15 45.813 kg (101 lb)  01/31/15 44.7 kg (98 lb 8.7 oz)    Physical Exam:  Constitutional: She appears alert, no distress. HENT: oral mucosa moist/pink Head: Normocephalic and atraumatic.  Eyes: Conjunctivae are normal. Pupils are equal, round, and reactive to light.  Neck: Normal range of motion. Neck supple.  Cardiovascular: Normal rate and regular rhythm.  Respiratory: Effort normal and breath sounds normal. No respiratory distress. She has no wheezes.  GI: Soft. Bowel sounds are normal.  Musculoskeletal: She exhibits no edema or tenderness.  Neurological: She is alert. RUE:  Language improving--initiating conversation/sentences---better when given a lead in/cue . RUE with spontaneous movement/voluntary effort now---still inconsistent. Occasional volitional movement also een in leg. DTR's 3+. Right central 7 and tongue deviation.   No withdraw to pain RUE and RLE.  M/S: left shoulder remains tender with Passive ABD/IR/ER Psych: more dynamic Skin: Skin is warm and dry. No breakdown  Assessment/Plan: 1. Functional deficits secondary to left frontal infarct which require 3+ hours per day of  interdisciplinary therapy in a comprehensive inpatient rehab setting. Physiatrist is providing close team supervision and 24 hour management of active medical problems listed below. Physiatrist and rehab team continue to assess barriers to discharge/monitor patient progress toward functional and medical goals.  FIM: FIM - Bathing Bathing Steps Patient Completed: Chest, Right Arm, Abdomen, Front perineal area, Right upper leg, Left upper leg Bathing: 3: Mod-Patient completes 5-7 28f 10 parts or 50-74%  FIM - Upper Body Dressing/Undressing Upper body dressing/undressing steps patient completed: Thread/unthread left sleeve of pullover shirt/dress, Put head through opening of pull over shirt/dress Upper body dressing/undressing: 3: Mod-Patient completed 50-74% of tasks FIM - Lower Body Dressing/Undressing Lower body dressing/undressing: 1: Total-Patient completed less than 25% of tasks  FIM - Toileting Toileting steps completed by patient: Performs perineal hygiene, Adjust clothing after toileting Toileting: 3: Mod-Patient completed 2 of 3 steps  FIM - Radio producer Devices: Elevated toilet seat Toilet Transfers: 2-From toilet/BSC: Max A (lift and lower assist)  FIM - Control and instrumentation engineer Devices: Best boy: 4: Sit > Supine: Min A (steadying pt. > 75%/lift 1 leg), 3: Chair or W/C > Bed: Mod A (lift or lower assist), 3: Bed > Chair or W/C: Mod A (lift or lower assist)  FIM - Locomotion: Wheelchair Distance: 100 Locomotion: Wheelchair: 2: Travels 50 - 149 ft with moderate assistance (Pt: 50 - 74%) FIM - Locomotion: Ambulation Locomotion: Ambulation Assistive Devices: Cane - Straight, Other (comment) (and no AD) Ambulation/Gait Assistance: 4: Min assist, 3: Mod assist Locomotion: Ambulation: 2: Travels 50 - 149 ft with moderate assistance (Pt: 50 - 74%)  Comprehension Comprehension Mode:  Auditory Comprehension:  3-Understands basic 50 - 74% of the time/requires cueing 25 - 50%  of the time  Expression Expression Mode: Verbal Expression: 2-Expresses basic 25 - 49% of the time/requires cueing 50 - 75% of the time. Uses single words/gestures.  Social Interaction Social Interaction: 3-Interacts appropriately 50 - 74% of the time - May be physically or verbally inappropriate.  Problem Solving Problem Solving: 2-Solves basic 25 - 49% of the time - needs direction more than half the time to initiate, plan or complete simple activities  Memory Memory: 2-Recognizes or recalls 25 - 49% of the time/requires cueing 51 - 75% of the time  Medical Problem List and Plan: 1. 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 Thoracic aortic and supraceliac aortic thrombus/Anticoagulation: Pharmaceutical: Coumadin. To follow up with Dr. Bridgett Larsson in 6 month for repeat CTA chest/abd/pelvis.  3. Pain Management: Tylenol prn 4. Mood:   prozac to help with mood. Team to provide ego support. LCSW to follow for evaluation and support.  5. Neuropsych: This patient is capable of making decisions on his own behalf. 6. Skin/Wound Care: Routine pressure relief measures. Maintain adequate nutrition and hydration status  -allevyn dressing for sacrum.  7. Fluids/Electrolytes/Nutrition:     -encourage po fluids as possible  -dc'ed IVF as on D1 now--PO still needs some work. Husband encourages  -bmet recently normal 8. HTN: Monitor BP every 8 hours. Continue Lopressor bid, and procardia XL. Avoid hypotension.  9. Dyslipidemia: On lovaza and Pravachol.  10 DM type 2: Hgb A1c- 6.9. Was on metformin  500mg  bid at home. will hold until po intake better.  - Monitor BS ac/hs and use SSI for elevated BS.  11. Osteoporosis: Continue Evista. Resume fosamax at discharge.  12. Dysphagia:  .  -D1/thins per SLP  LOS (Days) 10 A FACE TO FACE EVALUATION WAS  PERFORMED  SWARTZ,ZACHARY T 04/12/2015 7:54 AM

## 2015-04-12 NOTE — Progress Notes (Signed)
Occupational Therapy Session Note  Patient Details  Name: Laura Melendez MRN: [id number] Date of Birth: 1941/04/02  Today's Date: 04/12/2015 OT Individual Time: 1330-1400 OT Individual Time Calculation (min): 30 min    Short Term Goals: Week 1:  OT Short Term Goal 1 (Week 1): Pt will feed self with LUE with moderate assist OT Short Term Goal 1 - Progress (Week 1): Met OT Short Term Goal 2 (Week 1): Pt. will sit EOB for 10 minutes with no LOB OT Short Term Goal 2 - Progress (Week 1): Progressing toward goal OT Short Term Goal 3 (Week 1): Pt will bathe UB/LB with mod assist OT Short Term Goal 3 - Progress (Week 1): Met OT Short Term Goal 4 (Week 1): Pt. will dress UB/:LB with mod assist OT Short Term Goal 4 - Progress (Week 1): Progressing toward goal OT Short Term Goal 5 (Week 1): Pt. will dress UB with mod assist OT Short Term Goal 5 - Progress (Week 1): Met Week 2:  OT Short Term Goal 1 (Week 2): Pt will complete lower body dressing using AE, prn, with min assist OT Short Term Goal 2 (Week 2): Pt will demo ability to complete BUE HEP with supervision OT Short Term Goal 3 (Week 2): Pt will complete 1 grooming task using right hand with min assist OT Short Term Goal 4 (Week 2): Pt will bathe 8 of 10 body parts with min verbal cues to attend to affected side OT Short Term Goal 5 (Week 2): Caregiver will demonstrate ability to provide tactile cues during functional transfers in/out of bed.  Skilled Therapeutic Interventions/Progress Updates:    1:1 Pt already dressed and declined bathing and changing clothing. Focus on toilet transfer, toileting, sit to stand, standing balance, initiation, making a choice from a field of 2, sustained attention to task, initiation of functional use of right UE with automatic tasks, etc. Pt with mod A stand pivot transfer with more than reasonable amt of time and use of grab bar. Right Ue able to hold grab bar once placed on bar however required min A with max  tactile cues to maintain sitting balance (continues to lean posteriorly). Participated in grooming tasks (including brushing hair/ cleaning teeth, washing face, etc in standing with min A for maintaining balance with focus on initiation of task, functional use of right UE with automatic tasks. Pt requires more than reasonable amt of time for initiation for all tasks including answering basic wants and needs questions. Pt left in w/c with quick release on.   Therapy Documentation Precautions:  Precautions Precautions: Fall Precaution Comments: right hemiparesis, delayed initiation Restrictions Weight Bearing Restrictions: No Pain:  no c/o pain   See FIM for current functional status  Therapy/Group: Individual Therapy  Willeen Cass [facility] 04/12/2015, 2:59 PM

## 2015-04-12 NOTE — Progress Notes (Signed)
Physical Therapy Session Note  Patient Details  Name: Laura Melendez MRN: [id number] Date of Birth: September 08, 1941  Today's Date: 04/12/2015 PT Individual Time: 0830-0900 PT Individual Time Calculation (min): 30 min  Session 2 Time: 6948-5462 Time Calculation (min): 40 min  Short Term Goals: Week 2:  PT Short Term Goal 1 (Week 2): Pt will propel w/c 100' w/ hemi technique PT Short Term Goal 2 (Week 2): Pt will ambulate with MinA w/ LRAD consistently PT Short Term Goal 3 (Week 2): Pt will initiate sit>stand w/ min verbal cueing consistently PT Short Term Goal 4 (Week 2): Pt will complete all bed mobility with MinA and min cueing  Skilled Therapeutic Interventions/Progress Updates:    Session 1: Pt received seated in w/c w/ husband and RN present still eating breakfast and receiving morning medications. Pt allowed time to finish morning routine due to impaired ability to initiate swallowing with increased distractions in environment. Therapist returned 15 minutes later and RN still attempting to distribute meds to pt. Pt missed 30 minutes of scheduled PT. Remainder of session focused on sit<>stands, dynamic standing balance, gait. Pt stood for therapist to clean from incontinent bldder episode and don clean brief, then to dress UB and LB. Pt transported to ortho gym via [place] for time management. Instructed pt in ambulating 20'x2 then 75' w/ SPC, ModA to move sit>stand and facilitation of pelvic rotation bilaterally w/ pt demonstrating some active carryover after facilitation. Session ended in pt's room, where pt was left seated in w/c w/ husband present w/ all needs within reach.   Session 2: Pt received seated in w/c, agreeable to participate in therapy. Session focused on standing balance, sustained attention, initiation, gait. Pt transported to rehab gym via [place] for time management. Pt engaged in game of checkers in standing to address initiation, sustained attention and standing balance. Pt  initially able to engage in game for 10 minutes in standing, however as she fatigued pt began having trouble deciding how to move and took more and more time. Attempted cognitive rest break by having pt sit and close eyes for 5 minutes, however after opening eyes and standing back up pt said "I still don't know what to do." Switched activity to reaching activity at edge of mat to increase WBing and functional use of RUE. Instructed pt in ambulating back to room while pushing wheelchair to increase upright posture in gait, pt ambulated 150' back with MinA and intermittent ModA due to losses of balance. Session ended in pt's room, where pt was left seated in w/c w/ all needs within reach.    Therapy Documentation Precautions:  Precautions Precautions: Fall Precaution Comments: right hemiparesis, delayed initiation Restrictions Weight Bearing Restrictions: No General:   Vital Signs: Therapy Vitals Temp: 97.8 F (36.6 C) Temp Source: Oral Pulse Rate: 64 Resp: 18 BP: (!) 140/58 mmHg Patient Position (if appropriate): Lying Oxygen Therapy SpO2: 99 % O2 Device: Not Delivered Pain:  No/denies pain Other Treatments:    See FIM for current functional status  Therapy/Group: Individual Therapy  Rada Hay  Rada Hay, PT, DPT 04/12/2015, 7:44 AM

## 2015-04-12 NOTE — Progress Notes (Signed)
Speech Language Pathology Daily Session Note  Patient Details  Name: MIXTLI RENO MRN: [id number] Date of Birth: 1941/12/05  Today's Date: 04/12/2015 SLP Individual Time: 1000-1030 SLP Individual Time Calculation (min): 30 min  Short Term Goals: Week 2: SLP Short Term Goal 1 (Week 2): Patient will consume least restrictive diet without overt s/s of aspiration and while demonstrating a timely swallow initiation given Mod A verbal and visual in 75% of opportunities. SLP Short Term Goal 2 (Week 2): Patient will verbally express wants and needs in functional communication opportunities given Mod A verbal cues in 75% of opportunities. SLP Short Term Goal 3 (Week 2): Patient will demonstrate basic problem-solving with functional tasks given Max A verbal and visual cues in 75% of opportunities. SLP Short Term Goal 4 (Week 2): Patient will demonstrate selective attention in a mildly distracting environment for ~5 minutes given Max A verbal and visual cues. SLP Short Term Goal 5 (Week 2): Patient will identify 2 new cognitive deficits and 2 new physical deficits given Mod A question cues. SLP Short Term Goal 6 (Week 2): Patient will demonstrate initiation with functional tasks given Mod A verbal, visual, and tactile cues in 75% of opportunities.  Skilled Therapeutic Interventions: Skilled treatment session focused on dysphagia and cognitive goals. SLP facilitated session by providing minimal trials of Dys. 2 textures. Patient demonstrated efficient mastication with a timely swallow initiation, however, refused further trials after the initial bite. SLP also facilitated session by providing extra time and Min A question cues for sequencing and problem solving with picture cards and Mod A question cues with extra time for verbal description of pictures at the phrase and sentence level. Patient left in wheelchair with family member present. Continue with current plan of care.    FIM:   Comprehension Comprehension Mode: Auditory Comprehension: 3-Understands basic 50 - 74% of the time/requires cueing 25 - 50%  of the time Expression Expression Mode: Verbal Expression: 2-Expresses basic 25 - 49% of the time/requires cueing 50 - 75% of the time. Uses single words/gestures. Social Interaction Social Interaction: 3-Interacts appropriately 50 - 74% of the time - May be physically or verbally inappropriate. Problem Solving Problem Solving: 2-Solves basic 25 - 49% of the time - needs direction more than half the time to initiate, plan or complete simple activities Memory Memory: 2-Recognizes or recalls 25 - 49% of the time/requires cueing 51 - 75% of the time FIM - Eating Eating Activity: 5: Set-up assist for open containers;5: Needs verbal cues/supervision  Pain Pain Assessment Pain Assessment: No/denies pain  Therapy/Group: Individual Therapy  Cline Draheim 04/12/2015, 4:14 PM

## 2015-04-12 NOTE — Progress Notes (Signed)
Recreational Therapy Session Note  Patient Details  Name: Laura Melendez MRN: [id number] Date of Birth: 11-22-1941 Today's Date: 04/12/2015  Met with pt to evaluate for TR participation and pt stated low activity tolerance.  Full eval deferred.  Will monitor through team for future TR needs. [name] 04/12/2015, 4:39 PM

## 2015-04-13 ENCOUNTER — Ambulatory Visit: Payer: Medicare HMO | Admitting: Cardiology

## 2015-04-13 ENCOUNTER — Inpatient Hospital Stay (HOSPITAL_COMMUNITY): Payer: Medicare HMO | Admitting: Occupational Therapy

## 2015-04-13 ENCOUNTER — Inpatient Hospital Stay (HOSPITAL_COMMUNITY): Payer: Medicare HMO

## 2015-04-13 ENCOUNTER — Inpatient Hospital Stay (HOSPITAL_COMMUNITY): Payer: Medicare HMO | Admitting: Physical Therapy

## 2015-04-13 ENCOUNTER — Inpatient Hospital Stay (HOSPITAL_COMMUNITY): Payer: Medicare HMO | Admitting: Speech Pathology

## 2015-04-13 DIAGNOSIS — IMO0002 Reserved for concepts with insufficient information to code with codable children: Secondary | ICD-10-CM | POA: Diagnosis present

## 2015-04-13 DIAGNOSIS — E119 Type 2 diabetes mellitus without complications: Secondary | ICD-10-CM

## 2015-04-13 LAB — GLUCOSE, CAPILLARY
GLUCOSE-CAPILLARY: 150 mg/dL — AB (ref 70–99)
Glucose-Capillary: 125 mg/dL — ABNORMAL HIGH (ref 70–99)
Glucose-Capillary: 98 mg/dL (ref 70–99)
Glucose-Capillary: 158 mg/dL — ABNORMAL HIGH (ref 70–99)

## 2015-04-13 LAB — PROTIME-INR
INR: 2.45 — ABNORMAL HIGH (ref 0.00–1.49)
Prothrombin Time: 26.8 seconds — ABNORMAL HIGH (ref 11.6–15.2)

## 2015-04-13 MED ORDER — WARFARIN SODIUM 2 MG PO TABS
2.0000 mg | ORAL_TABLET | Freq: Once | ORAL | Status: AC
  Administered 2015-04-13: 2 mg via ORAL
  Filled 2015-04-13: qty 1

## 2015-04-13 NOTE — Progress Notes (Signed)
Occupational Therapy Session Note  Patient Details  Name: Laura Melendez MRN: [id number] Date of Birth: 12-Nov-1941  Today's Date: 04/13/2015 OT Individual Time: 1100-1205 OT Individual Time Calculation (min): 65 min    Short Term Goals: Week 2:  OT Short Term Goal 1 (Week 2): Pt will complete lower body dressing using AE, prn, with min assist OT Short Term Goal 2 (Week 2): Pt will demo ability to complete BUE HEP with supervision OT Short Term Goal 3 (Week 2): Pt will complete 1 grooming task using right hand with min assist OT Short Term Goal 4 (Week 2): Pt will bathe 8 of 10 body parts with min verbal cues to attend to affected side OT Short Term Goal 5 (Week 2): Caregiver will demonstrate ability to provide tactile cues during functional transfers in/out of bed.  Skilled Therapeutic Interventions/Progress Updates:    Pt seen for OT ADL bathing and dressing session. Pt in w/c upon arrival, husband present and agreeable to tx, however voicing fatigue due to previous sessions. Pt transferred w/c> tub transfer bench with mod A and max VCs and tactile cues for sequencing/ technique. Pt required max A and VCs to doff all clothing and to maintain attention to task. Pt with ataxic like movements, requiring significantly increased time to process simple commands. Pt unable to problem solve how to doff shirt, requiring max VCs and tactile cues to assist. Pt completed showering task with max A due to decreased attention to task, as pt appeared to be easily distracted by external stimuli and was falling asleep throughout task. Pt required max cues to wash B UEs and LEs, and stood to complete pericare with assist provided for buttock hygiene. Pt required max-total A to dress following shower. She required significantly increased time to problem solve donning bra, able to thread L LE into strap, and requiring total A for remainder of dressing tasks. Pt +2 assist from husband for verbal and tactile cues for  stand pivot transfer out of shower, as pt unable to coordinate steps of transfer, and sat down halfway through transfer requiring max A to be positioned in w/c. Pt left sitting in w/c at end of session, safety belt donned, and NT entering.    Education provided regarding hemi-dressing techniques, transfer sequencing and use of call bell.   Therapy Documentation  Precautions:  Precautions Precautions: Fall Precaution Comments: right hemiparesis, delayed initiation Restrictions Weight Bearing Restrictions: No Pain:   Pt initially denied pain upon beginning of session, however, grimaced with movement of L shoulder during functional tasks. Pt's arm repositioned for comfort.   See FIM for current functional status  Therapy/Group: Individual Therapy  Lewis, Caymen Dubray C 04/13/2015, 7:27 AM

## 2015-04-13 NOTE — Progress Notes (Signed)
Speech Language Pathology Daily Session Note  Patient Details  Name: Laura Melendez MRN: [id number] Date of Birth: 10/11/1941  Today's Date: 04/13/2015 SLP Individual Time: 1000-1100 SLP Individual Time Calculation (min): 60 min  Short Term Goals: Week 2: SLP Short Term Goal 1 (Week 2): Patient will consume least restrictive diet without overt s/s of aspiration and while demonstrating a timely swallow initiation given Mod A verbal and visual in 75% of opportunities. SLP Short Term Goal 2 (Week 2): Patient will verbally express wants and needs in functional communication opportunities given Mod A verbal cues in 75% of opportunities. SLP Short Term Goal 3 (Week 2): Patient will demonstrate basic problem-solving with functional tasks given Max A verbal and visual cues in 75% of opportunities. SLP Short Term Goal 4 (Week 2): Patient will demonstrate selective attention in a mildly distracting environment for ~5 minutes given Max A verbal and visual cues. SLP Short Term Goal 5 (Week 2): Patient will identify 2 new cognitive deficits and 2 new physical deficits given Mod A question cues. SLP Short Term Goal 6 (Week 2): Patient will demonstrate initiation with functional tasks given Mod A verbal, visual, and tactile cues in 75% of opportunities.  Skilled Therapeutic Interventions: Skilled treatment session focused on cognitive goals. SLP facilitated session by providing Min A verbal and visual cues for functional problem solving and initiation during a basic money management task. However, as session progressed, patient required overall Max A verbal and question cues for initiation and sustained attention to tasks. Patient was administered the MoCA-Blind and scored a 13/22 with a score of 18 or above considered normal. Patient demonstrated deficits in the areas of attention and recall throughout the evaluation.  Patient/family education ongoing. Patient left in wheelchair with husband present. Continue  with current plan of care.    FIM:  Comprehension Comprehension Mode: Auditory Comprehension: 3-Understands basic 50 - 74% of the time/requires cueing 25 - 50%  of the time Expression Expression Mode: Verbal Expression: 2-Expresses basic 25 - 49% of the time/requires cueing 50 - 75% of the time. Uses single words/gestures. Social Interaction Social Interaction: 3-Interacts appropriately 50 - 74% of the time - May be physically or verbally inappropriate. Problem Solving Problem Solving: 2-Solves basic 25 - 49% of the time - needs direction more than half the time to initiate, plan or complete simple activities Memory Memory: 2-Recognizes or recalls 25 - 49% of the time/requires cueing 51 - 75% of the time  Pain Pain Assessment Pain Assessment: No/denies pain  Therapy/Group: Individual Therapy  Marshea Wisher 04/13/2015, 4:08 PM

## 2015-04-13 NOTE — Progress Notes (Signed)
[place] PHYSICAL MEDICINE & REHABILITATION     PROGRESS NOTE    Subjective/Complaints: Good night. Up and alert husband present.   ROS still somewhat limited due to language. Husband at bedside  Objective: Vital Signs: Blood pressure 149/58, pulse 63, temperature 97.9 F (36.6 C), temperature source Oral, resp. rate 18, weight 46.1 kg (101 lb 10.1 oz), SpO2 98 %. No results found. No results for input(s): WBC, HGB, HCT, PLT in the last 72 hours.  Recent Labs  04/12/15 0810  NA 138  K 4.5  CL 105  GLUCOSE 126*  BUN 12  CREATININE 0.54  CALCIUM 9.6   CBG (last 3)   Recent Labs  04/12/15 1727 04/12/15 2151 04/13/15 0709  GLUCAP 97 105* 125*    Wt Readings from Last 3 Encounters:  04/05/15 46.1 kg (101 lb 10.1 oz)  02/15/15 45.813 kg (101 lb)  01/31/15 44.7 kg (98 lb 8.7 oz)    Physical Exam:  Constitutional: She appears alert, no distress. HENT: oral mucosa moist/pink Head: Normocephalic and atraumatic.  Eyes: Conjunctivae are normal. Pupils are equal, round, and reactive to light.  Neck: Normal range of motion. Neck supple.  Cardiovascular: Normal rate and regular rhythm. no murmur Respiratory: Effort normal and breath sounds normal. No respiratory distress. She has no wheezes.  GI: Soft. Bowel sounds are normal.  Musculoskeletal: She exhibits no edema or tenderness.  Neurological: She is alert. RUE:  Language improving--initiating conversation/sentences---better when given a lead in/cue . RUE with spontaneous movement/voluntary effort now---MMT 1+ to 2+ RUE now.    DTR's 3+. Right central 7 and tongue deviation.   No withdraw to pain RUE and RLE.  M/S: left shoulder somewhat tender with Passive ABD/IR/ER Psych: more dynamic Skin: Skin is warm and dry. No breakdown  Assessment/Plan: 1. Functional deficits secondary to left frontal infarct which require 3+ hours per day of interdisciplinary therapy in a comprehensive inpatient rehab  setting. Physiatrist is providing close team supervision and 24 hour management of active medical problems listed below. Physiatrist and rehab team continue to assess barriers to discharge/monitor patient progress toward functional and medical goals.  FIM: FIM - Bathing Bathing Steps Patient Completed: Chest, Right Arm, Abdomen, Front perineal area, Right upper leg, Left upper leg Bathing: 3: Mod-Patient completes 5-7 16f 10 parts or 50-74%  FIM - Upper Body Dressing/Undressing Upper body dressing/undressing steps patient completed: Thread/unthread left sleeve of pullover shirt/dress, Put head through opening of pull over shirt/dress Upper body dressing/undressing: 3: Mod-Patient completed 50-74% of tasks FIM - Lower Body Dressing/Undressing Lower body dressing/undressing: 1: Total-Patient completed less than 25% of tasks  FIM - Toileting Toileting steps completed by patient: Performs perineal hygiene, Adjust clothing after toileting Toileting: 3: Mod-Patient completed 2 of 3 steps  FIM - Radio producer Devices: Elevated toilet seat Toilet Transfers: 2-From toilet/BSC: Max A (lift and lower assist)  FIM - Control and instrumentation engineer Devices: Best boy: 0: Activity did not occur  FIM - Locomotion: Wheelchair Distance: 100 Locomotion: Wheelchair: 1: Total Assistance/staff pushes wheelchair (Pt<25%) FIM - Locomotion: Ambulation Locomotion: Ambulation Assistive Devices: Journalist, newspaper Ambulation/Gait Assistance: 3: Mod assist Locomotion: Ambulation: 1: Travels less than 50 ft with moderate assistance (Pt: 50 - 74%)  Comprehension Comprehension Mode: Auditory Comprehension: 3-Understands basic 50 - 74% of the time/requires cueing 25 - 50%  of the time  Expression Expression Mode: Verbal Expression: 2-Expresses basic 25 - 49% of the time/requires cueing 50 - 75% of the time.  Uses single words/gestures.  Social  Interaction Social Interaction: 3-Interacts appropriately 50 - 74% of the time - May be physically or verbally inappropriate.  Problem Solving Problem Solving: 2-Solves basic 25 - 49% of the time - needs direction more than half the time to initiate, plan or complete simple activities  Memory Memory: 2-Recognizes or recalls 25 - 49% of the time/requires cueing 51 - 75% of the time  Medical Problem List and Plan: 1. 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 Thoracic aortic and supraceliac aortic thrombus/Anticoagulation: Pharmaceutical: Coumadin. To follow up with Dr. Bridgett Larsson in 6 month for repeat CTA chest/abd/pelvis.  3. Pain Management: Tylenol prn 4. Mood:   prozac to help with mood. Team to provide ego support. LCSW to follow for evaluation and support.  5. Neuropsych: This patient is capable of making decisions on his own behalf.  -ritalin trial to help with initiation/processing 6. Skin/Wound Care: Routine pressure relief measures. Maintain adequate nutrition and hydration status  -allevyn dressing for sacrum.  7. Fluids/Electrolytes/Nutrition:     -encourage po fluids as possible  -dc'ed IVF as on D1 now--PO still needs some work. Husband encourages  -bmet recently normal  -added ritalin to help with initiation (may help with eating too) 8. HTN: Monitor BP every 8 hours. Continue Lopressor bid, and procardia XL. Avoid hypotension.  9. Dyslipidemia: On lovaza and Pravachol.  10 DM type 2: Hgb A1c- 6.9. Was on metformin  500mg  bid at home. will hold until po intake better.  - Monitor BS ac/hs and use SSI for elevated BS.  11. Osteoporosis: Continue Evista. Resume fosamax at discharge.  12. Dysphagia:  .  -D1/thins per SLP  LOS (Days) 11 A FACE TO FACE EVALUATION WAS PERFORMED  Oley Lahaie T 04/13/2015 8:33 AM

## 2015-04-13 NOTE — Progress Notes (Signed)
ANTICOAGULATION CONSULT NOTE - Follow Up Consult  Pharmacy Consult for coumadin Indication: aortic clot  No Known Allergies  Patient Measurements: Weight: 101 lb 10.1 oz (46.1 kg) Heparin Dosing Weight:   Vital Signs: Temp: 97.9 F (36.6 C) (04/27 0450) Temp Source: Oral (04/27 0450) BP: 149/58 mmHg (04/27 0450) Pulse Rate: 63 (04/27 0450)  Labs:  Recent Labs  04/11/15 1120 04/12/15 0810 04/13/15 0605  LABPROT 28.8* 31.2* 26.8*  INR 2.69* 2.98* 2.45*  CREATININE  --  0.54  --     Estimated Creatinine Clearance: 44.9 mL/min (by C-G formula based on Cr of 0.54).   Medications:  Scheduled:  . calcium-vitamin D  1 tablet Oral BID WC  . feeding supplement (GLUCERNA SHAKE)  237 mL Oral TID BM  . FLUoxetine  10 mg Oral Daily  . insulin aspart  0-9 Units Subcutaneous TID WC  . lisinopril  2.5 mg Oral Daily  . methylphenidate  5 mg Oral BID WC  . metoprolol  50 mg Oral BID  . multivitamin with minerals  1 tablet Oral Daily  . NIFEdipine  60 mg Oral Daily  . omega-3 acid ethyl esters  1 g Oral BID  . pravastatin  40 mg Oral q1800  . raloxifene  60 mg Oral Daily  . 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  INR today is down to 2.45.  Goal of Therapy:  INR 2-3 Monitor platelets by anticoagulation protocol: Yes   Plan:  - coumadin 2mg  po x1 - INR in am  Laura Melendez, Laura Melendez 04/13/2015,8:28 AM

## 2015-04-13 NOTE — Progress Notes (Signed)
Physical Therapy Session Note  Patient Details  Name: Laura Melendez MRN: [id number] Date of Birth: 12/02/1941  Today's Date: 04/13/2015 PT Individual Time: 0800-0900 PT Individual Time Calculation (min): 60 min   Short Term Goals: Week 2:  PT Short Term Goal 1 (Week 2): Pt will propel w/c 100' w/ hemi technique PT Short Term Goal 2 (Week 2): Pt will ambulate with MinA w/ LRAD consistently PT Short Term Goal 3 (Week 2): Pt will initiate sit>stand w/ min verbal cueing consistently PT Short Term Goal 4 (Week 2): Pt will complete all bed mobility with MinA and min cueing  Skilled Therapeutic Interventions/Progress Updates:    Pt received supine in bed, agreeable to participate in therapy. Initial part of session focused on assisting with cueing for swallowing breakfast and morning medications in puree. After eating oatmeal and swallowing medication pt moved supine>sit w/ ModA and dressed UB/LB at edge of mat with MaxA. Instructed pt in stand pivot transfer bed>w/c w/ single point cane. Trialed pt ambulating w/ RW due to increased active movement in R hand, pt ambulated 200' w/ RW and MinA from therapist to manage RW (pt had difficulty maintaining straight path independently). At edge of mat instructed pt in seated reaching exercise with small wedge to bias pt for anterior pelvic tilt. During reaching noted active hip flexion and anterior pelvic tilt to assist with reaching. Pt tolerated session well. Session ended in pt's room, where pt was left seated in w/c w/ husband present w/ all needs within reach.    Therapy Documentation Precautions:  Precautions Precautions: Fall Precaution Comments: right hemiparesis, delayed initiation Restrictions Weight Bearing Restrictions: No Pain:  No/denies pain  See FIM for current functional status  Therapy/Group: Individual Therapy  Rada Hay  Rada Hay, PT, DPT 04/13/2015, 7:47 AM

## 2015-04-13 NOTE — Patient Care Conference (Signed)
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 Diabetes 04/13/2015  . 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 Right hemiparesis 04/04/2015  . 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 Aortic thrombus   . Stroke with cerebral ischemia   . CVA (cerebral infarction) 03/30/2015  . 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 Fever   . HCAP (healthcare-associated pneumonia) 01/29/2015  . Sepsis 01/29/2015  . DM (diabetes mellitus) 03/23/2014  . Osteoporosis, unspecified 03/23/2014  . Dyslipidemia 03/23/2014  . Tobacco abuse 03/23/2014  . Syncope 01/01/2014  . Essential hypertension, benign 01/01/2014    Expected Discharge Date: Expected Discharge Date: 04/22/15  Team Members Present: Physician leading conference: Dr. Alger Simons Social Worker Present: Alfonse Alpers, LCSW Nurse Present: Dorien Chihuahua, RN PT Present: Canary Brim, Lorriane Shire, PT OT Present: Willeen Cass, OT;Other (comment) Napoleon Form, OT) SLP Present: Weston Anna, SLP Other (Discipline and Name): Danne Baxter, RN [facility]) PPS Coordinator present : Daiva Nakayama, RN, [facility]     Current Status/Progress Goal Weekly Team Focus  Medical   Improving initiation. still not eating well. consider ritalin trial, language better  improve  initiation  see above, nutrition,    Bowel/Bladder   patient is usually incontinent of bowel and bladder  manage bowel and bladder with max assist  timed toileting   Swallow/Nutrition/ Hydration   Dys. 1 textures with thin liquids, Mod A multimodal cues for use of swallow strategies  Min A for use of swallowing strategies  decrease oral holding, trials of upgraded textures    ADL's   Mod Assist bathing and upper body dressing, Max assist lower body dressing, Sup-Min Assist transfers  Min A dynamic standing, B & D: Supervision sitting balance, grooming and self-feeding  Adpated bathing/dressing skills, improved gross/FMC of RUE, pt/family ed, dynamic sitting/standing balance   Mobility   Overall ModA for bed mobility and transfers, but requires up to Max-Total to initiate movement. Once in standing, Min-Mod for ambulation  overall supervision for bed mobility and w/c propulsion, MinA for short distance ambulation and transfers  initiation, cognitive remediation, attention, balance   Communication   Max A for initiation of verbal expression   Min A  initiation of verbal expression    Safety/Cognition/ Behavioral Observations  Max A  Min A  initiation, sustained attention, functional problem solving    Pain   patient denies pain at this time  pain less than or equal to 3 on a scale of 0-10  assess pain q4h andmedicate as indicated   Skin   Skin is free dfrom s/s of berakdown/injury  Remain free drom skin breakdown/injury  awwess skin q shift and prn    Rehab Goals Patient on target to meet rehab goals: Yes Rehab Goals Revised: none *See Care Plan and progress  notes for long and short-term goals.  Barriers to Discharge: see prior    Possible Resolutions to Barriers:  cognitive behavioral rx, start stimulation    Discharge Planning/Teaching Needs:  Pt plans to return to her home with her husband to provide minimal assistance.  Husband has observed therapies, but will participate in  formal family education closer to d/c.   Team Discussion:  Pt is more alert and is making more alert.  Pt would benefit from more initiation and Dr. Naaman Plummer will try ritalin.  Pt needs to eat more and ST is trying to progress pt's diet, but she won't eat easily.  Pt does better in mornings with ST, but worse with PT in the mornings.  Pt has had some improvement with PT and does well once up.  She is mod A to get up.  OT reports pt is moving her right arm more and once she is up, pt does better to do session in standing.  Pt is working on making choices in therapy.  Revisions to Treatment Plan:  none   Continued Need for Acute Rehabilitation Level of Care: The patient requires daily medical management by a physician with specialized training in physical medicine and rehabilitation for the following conditions: Daily direction of a multidisciplinary physical rehabilitation program to ensure safe treatment while eliciting the highest outcome that is of practical value to the patient.: Yes Daily medical management of patient stability for increased activity during participation in an intensive rehabilitation regime.: Yes Daily analysis of laboratory values and/or radiology reports with any subsequent need for medication adjustment of medical intervention for : Post surgical problems;Neurological problems  [name], Silvestre Mesi 04/13/2015, 9:23 AM

## 2015-04-13 NOTE — Progress Notes (Signed)
Physical Therapy Session Note  Patient Details  Name: NINAH MOCCIO MRN: [id number] Date of Birth: 24-Apr-1941  Today's Date: 04/13/2015 PT Individual Time: 9622-2979 PT Individual Time Calculation (min): 30 min   Short Term Goals: Week 2:  PT Short Term Goal 1 (Week 2): Pt will propel w/c 100' w/ hemi technique PT Short Term Goal 2 (Week 2): Pt will ambulate with MinA w/ LRAD consistently PT Short Term Goal 3 (Week 2): Pt will initiate sit>stand w/ min verbal cueing consistently PT Short Term Goal 4 (Week 2): Pt will complete all bed mobility with MinA and min cueing  Skilled Therapeutic Interventions/Progress Updates:    Gait Training: PT instructs pt in ambulation with RW x 200' req min A for facilitation weight shift to L to facilitate increased R step length.   W/C Management: PT exchanges pt's w/c to a 16x16 manual with a solid backrest (Roho) and PT adjusts backrest all the way forward and tilted forward to reduce pt's sacral sitting - pt's upright posture is much improved with this positioning.   Pt's participation/initiation with sit to stand is much improved when PT instructs pt to place hands on RW and so this unconventional method will be continued. Continue per PT POC.   Therapy Documentation Precautions:  Precautions Precautions: Fall Precaution Comments: right hemiparesis, delayed initiation Restrictions Weight Bearing Restrictions: No  Pain: Pain Assessment Pain Assessment: No/denies pain   See FIM for current functional status  Therapy/Group: Individual Therapy  Jyquan Kenley M 04/13/2015, 8:56 AM

## 2015-04-14 ENCOUNTER — Inpatient Hospital Stay (HOSPITAL_COMMUNITY): Payer: Medicare HMO

## 2015-04-14 ENCOUNTER — Inpatient Hospital Stay (HOSPITAL_COMMUNITY): Payer: Medicare HMO | Admitting: Physical Therapy

## 2015-04-14 ENCOUNTER — Inpatient Hospital Stay (HOSPITAL_COMMUNITY): Payer: Medicare HMO | Admitting: Speech Pathology

## 2015-04-14 ENCOUNTER — Inpatient Hospital Stay (HOSPITAL_COMMUNITY): Payer: Medicare HMO | Admitting: Occupational Therapy

## 2015-04-14 DIAGNOSIS — I7410 Embolism and thrombosis of unspecified parts of aorta: Secondary | ICD-10-CM

## 2015-04-14 DIAGNOSIS — E08311 Diabetes mellitus due to underlying condition with unspecified diabetic retinopathy with macular edema: Secondary | ICD-10-CM

## 2015-04-14 LAB — GLUCOSE, CAPILLARY
GLUCOSE-CAPILLARY: 215 mg/dL — AB (ref 70–99)
Glucose-Capillary: 129 mg/dL — ABNORMAL HIGH (ref 70–99)
Glucose-Capillary: 108 mg/dL — ABNORMAL HIGH (ref 70–99)
Glucose-Capillary: 136 mg/dL — ABNORMAL HIGH (ref 70–99)

## 2015-04-14 LAB — PROTIME-INR
INR: 1.12 (ref 0.00–1.49)
INR: 2.12 — AB (ref 0.00–1.49)
PROTHROMBIN TIME: 14.5 s (ref 11.6–15.2)
PROTHROMBIN TIME: 23.9 s — AB (ref 11.6–15.2)

## 2015-04-14 MED ORDER — WARFARIN SODIUM 2 MG PO TABS
2.0000 mg | ORAL_TABLET | Freq: Once | ORAL | Status: AC
  Administered 2015-04-14: 2 mg via ORAL
  Filled 2015-04-14: qty 1

## 2015-04-14 MED ORDER — NYSTATIN 100000 UNIT/ML MT SUSP
5.0000 mL | Freq: Four times a day (QID) | OROMUCOSAL | Status: AC
Start: 2015-04-14 — End: 2015-04-17
  Administered 2015-04-14 – 2015-04-16 (×11): 500000 [IU] via ORAL
  Filled 2015-04-14 (×12): qty 5

## 2015-04-14 NOTE — Progress Notes (Signed)
NUTRITION FOLLOW UP  Pt meets criteria for NON-SEVERE (MODERATE) MALNUTRITION in the context of chronic illness as evidenced by moderate fat and severe muscle mass loss.  DOCUMENTATION CODES Per approved criteria  -Non-severe (moderate) malnutrition in the context of chronic illness   INTERVENTION: Continue Glucerna Shake po TID, each supplement provides 220 kcal and 10 grams of protein.  Provide nourishment snacks (yogurt). Ordered.  Encourage adequate PO intake.  NUTRITION DIAGNOSIS: Inadequate oral intake related to dislike of food as evidenced by meal completion 15-70%; ongoing  Goal: Pt to meet >/= 90% of their estimated nutrition needs; progressing  Monitor:  PO intake, weight trends, labs, I/O's  74 y.o. 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  Pt reports appetite is food. Meal completion has been improving 40-60%. Pt has been consuming her Glucerna Shake and likes them. RD to continue with current orders. Pt was encouraged to eat her food at meals and to drink her supplements. Additionally discussed food preferences with pt and husband. RD to make note in [facility].  Labs and medications reviewed.  Height: Ht Readings from Last 1 Encounters:  02/15/15 5\' 1"  (1.549 m)    Weight: Wt Readings from Last 1 Encounters:  04/05/15 101 lb 10.1 oz (46.1 kg)    BMI:  Body mass index is 19.21 kg/(m^2).  Re-Estimated Nutritional Needs: Kcal: 1600-1800 Protein: 70-85 grams Fluid: 1.6 - 1.8 L/day  Skin: Intact  Diet Order: DIET - DYS 1 Room service appropriate?: Yes; Fluid consistency:: Thin   Intake/Output Summary (Last 24 hours) at 04/14/15 1528 Last data filed at 04/14/15  0800  Gross per 24 hour  Intake    200 ml  Output      0 ml  Net    200 ml    Last BM: 4/26  Labs:   Recent Labs Lab 04/12/15 0810  NA 138  K 4.5  CL 105  CO2 22  BUN 12  CREATININE 0.54  CALCIUM 9.6  GLUCOSE 126*    CBG (last 3)   Recent Labs  04/13/15 2112 04/14/15 0636 04/14/15 1233  GLUCAP 150* 129* 108*    Scheduled Meds: . calcium-vitamin D  1 tablet Oral BID WC  . feeding supplement (GLUCERNA SHAKE)  237 mL Oral TID BM  . FLUoxetine  10 mg Oral Daily  . insulin aspart  0-9 Units Subcutaneous TID WC  . lisinopril  2.5 mg Oral Daily  . methylphenidate  5 mg Oral BID WC  . metoprolol  50 mg Oral BID  . multivitamin with minerals  1 tablet Oral Daily  . NIFEdipine  60 mg Oral Daily  . nystatin  5 mL Oral QID  . pravastatin  40 mg Oral q1800  . raloxifene  60 mg Oral Daily  . warfarin  2 mg Oral ONCE-1800  . Warfarin - Pharmacist Dosing Inpatient   Does not apply q1800    Continuous Infusions:   Past Medical History  Diagnosis Date  . Essential hypertension, benign   . Type 2 diabetes mellitus   . Osteoporosis 06/2008  . Stroke     Past Surgical History  Procedure Laterality Date  . Ankle surgery Left 2004    otif  . Abdominal hysterectomy    .  Anterior and posterior repair N/A 03/11/2013    Procedure: ANTERIOR (CYSTOCELE) AND POSTERIOR REPAIR (RECTOCELE);  Surgeon: Linda Hedges, DO;  Location: [facility];  Service: Gynecology;  Laterality: N/A;  with sacrospinous ligament fixation bilateral  . Colonoscopy  2009    negative  . Cataract extraction w/phaco Right 12/27/2014    Procedure: CATARACT EXTRACTION PHACO AND INTRAOCULAR LENS PLACEMENT RIGHT EYE;  Surgeon: Tonny Branch, MD;  Location: [facility];  Service: Ophthalmology;  Laterality: Right;  CDE:10.63  . Cataract extraction w/phaco Left 01/13/2015    Procedure: CATARACT EXTRACTION PHACO AND INTRAOCULAR LENS PLACEMENT LEFT EYE;  Surgeon: Tonny Branch, MD;  Location: [facility];  Service: Ophthalmology;   Laterality: Left;  CDE:18.87  . Cholecystectomy    . Tee without cardioversion N/A 04/01/2015    Procedure: TRANSESOPHAGEAL ECHOCARDIOGRAM (TEE);  Surgeon: Sanda Klein, MD;  Location: [facility];  Service: Cardiovascular;  Laterality: N/A;    Kallie Locks, MS, RD, LDN Pager # [phone] After hours/ weekend pager # [phone]

## 2015-04-14 NOTE — Progress Notes (Signed)
[facility] PHYSICAL MEDICINE & REHABILITATION     PROGRESS NOTE    Subjective/Complaints: No problems. Ate better yesterday. Tolerating therapies. Denies cough, sob, cp   ROS still somewhat limited due to language. Husband at bedside  Objective: Vital Signs: Blood pressure 116/47, pulse 59, temperature 98.3 F (36.8 C), temperature source Oral, resp. rate 17, weight 46.1 kg (101 lb 10.1 oz), SpO2 97 %. No results found. No results for input(s): WBC, HGB, HCT, PLT in the last 72 hours.  Recent Labs  04/12/15 0810  NA 138  K 4.5  CL 105  GLUCOSE 126*  BUN 12  CREATININE 0.54  CALCIUM 9.6   CBG (last 3)   Recent Labs  04/13/15 1643 04/13/15 2112 04/14/15 0636  GLUCAP 158* 150* 129*    Wt Readings from Last 3 Encounters:  04/05/15 46.1 kg (101 lb 10.1 oz)  02/15/15 45.813 kg (101 lb)  01/31/15 44.7 kg (98 lb 8.7 oz)    Physical Exam:  Constitutional: She appears alert, no distress. HENT: mild coating of thrush on the tongue Head: Normocephalic and atraumatic.  Eyes: Conjunctivae are normal. Pupils are equal, round, and reactive to light.  Neck: Normal range of motion. Neck supple.  Cardiovascular: Normal rate and regular rhythm. no murmur Respiratory: Effort normal and breath sounds normal. No respiratory distress. She has no wheezes.  GI: Soft. Bowel sounds are normal.  Musculoskeletal: She exhibits no edema or tenderness.  Neurological: She is alert. RUE:  Language improving--initiating conversation/sentences---better when given a lead in/cue . RUE with spontaneous movement/voluntary effort now---MMT 2- to 2+ RUE. RLE: 2 to 2+ but inconsistent due to apraxia. Underlying tone this am in flexion of leg---broke once leg extended    DTR's 3+. Right central 7 and tongue deviation.   No withdraw to pain RUE and RLE.  M/S: left shoulder less tender with Passive ABD/IR/ER Psych: more dynamic Skin: Skin is warm and dry. No breakdown  Assessment/Plan: 1.  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 Physiatrist is providing close team supervision and 24 hour management of active medical problems listed below. Physiatrist and rehab team continue to assess barriers to discharge/monitor patient progress toward functional and medical goals.  FIM: FIM - Bathing Bathing Steps Patient Completed: Chest, Left Arm, Abdomen, Front perineal area, Right upper leg, Left upper leg Bathing: 2: Max-Patient completes 3-4 89f 10 parts or 25-49%  FIM - Upper Body Dressing/Undressing Upper body dressing/undressing steps patient completed: Thread/unthread left sleeve of pullover shirt/dress Upper body dressing/undressing: 2: Max-Patient completed 25-49% of tasks FIM - Lower Body Dressing/Undressing Lower body dressing/undressing: 1: Two helpers  FIM - Toileting Toileting steps completed by patient: Performs perineal hygiene, Adjust clothing after toileting Toileting: 3: Mod-Patient completed 2 of 3 steps  FIM - Radio producer Devices: Elevated toilet seat Toilet Transfers: 2-From toilet/BSC: Max A (lift and lower assist)  FIM - Control and instrumentation engineer Devices: Bogacz, Arm rests Bed/Chair Transfer: 3: Supine > Sit: Mod A (lifting assist/Pt. 50-74%/lift 2 legs, 3: Bed > Chair or W/C: Mod A (lift or lower assist), 3: Chair or W/C > Bed: Mod A (lift or lower assist)  FIM - Locomotion: Wheelchair Distance: 100 Locomotion: Wheelchair: 1: Total Assistance/staff pushes wheelchair (Pt<25%) FIM - Locomotion: Ambulation Locomotion: Ambulation Assistive Devices: Administrator Ambulation/Gait Assistance: 4: Min assist Locomotion: Ambulation: 4: Travels 150 ft or more with minimal assistance (Pt.>75%)  Comprehension Comprehension Mode: Auditory Comprehension: 3-Understands basic 50 - 74% of the  time/requires cueing 25 - 50%  of the time  Expression Expression Mode: Verbal Expression: 2-Expresses basic 25 - 49% of the time/requires cueing 50 - 75% of the time. Uses single words/gestures.  Social Interaction Social Interaction: 3-Interacts appropriately 50 - 74% of the time - May be physically or verbally inappropriate.  Problem Solving Problem Solving: 2-Solves basic 25 - 49% of the time - needs direction more than half the time to initiate, plan or complete simple activities  Memory Memory: 2-Recognizes or recalls 25 - 49% of the time/requires cueing 51 - 75% of the time  Medical Problem List and Plan: 1. 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 Thoracic aortic and supraceliac aortic thrombus/Anticoagulation: Pharmaceutical: Coumadin. To follow up with Dr. Bridgett Larsson in 6 month for repeat CTA chest/abd/pelvis.  3. Pain Management: Tylenol prn 4. Mood:   prozac to help with mood. Team to provide ego support. LCSW to follow for evaluation and support.  5. Neuropsych: This patient is capable of making decisions on his own behalf.  -ritalin trial to help with initiation/processing 6. Skin/Wound Care: Routine pressure relief measures. Maintain adequate nutrition and hydration status  -allevyn dressing for sacrum.  7. Fluids/Electrolytes/Nutrition:     -encourage po fluids as possible  -better intake yesterday  -bmet recently normal  -continue ritalin to help with initiation (may help with eating too) 8. HTN: Monitor BP every 8 hours. Continue Lopressor bid, and procardia XL. Avoid hypotension.  9. Dyslipidemia: On lovaza and Pravachol.  10 DM type 2: Hgb A1c- 6.9. Was on metformin  500mg  bid at home. will hold until po intake better.  - Monitor BS ac/hs and use SSI for elevated BS.  11. Osteoporosis: Continue Evista. Resume fosamax at discharge.  12. Dysphagia:  .  -D1/thins per SLP  LOS (Days) 12 A FACE TO  FACE EVALUATION WAS PERFORMED  Laura Melendez T 04/14/2015 7:02 AM

## 2015-04-14 NOTE — Progress Notes (Signed)
Speech Language Pathology Daily Session Note  Patient Details  Name: Laura Melendez MRN: [id number] Date of Birth: December 25, 1940  Today's Date: 04/14/2015 SLP Individual Time: 1000-1100 SLP Individual Time Calculation (min): 60 min  Short Term Goals: Week 2: SLP Short Term Goal 1 (Week 2): Patient will consume least restrictive diet without overt s/s of aspiration and while demonstrating a timely swallow initiation given Mod A verbal and visual in 75% of opportunities. SLP Short Term Goal 2 (Week 2): Patient will verbally express wants and needs in functional communication opportunities given Mod A verbal cues in 75% of opportunities. SLP Short Term Goal 3 (Week 2): Patient will demonstrate basic problem-solving with functional tasks given Max A verbal and visual cues in 75% of opportunities. SLP Short Term Goal 4 (Week 2): Patient will demonstrate selective attention in a mildly distracting environment for ~5 minutes given Max A verbal and visual cues. SLP Short Term Goal 5 (Week 2): Patient will identify 2 new cognitive deficits and 2 new physical deficits given Mod A question cues. SLP Short Term Goal 6 (Week 2): Patient will demonstrate initiation with functional tasks given Mod A verbal, visual, and tactile cues in 75% of opportunities.  Skilled Therapeutic Interventions: Skilled treatment session focused on cognitive goals. SLP facilitated session by providing Min A verbal and question cues initiation and for self-monitoring and correcting of errors during a 4 and 6 step picture sequencing task, patient was able to independently complete task with 75% accuracy. Patient also verbally described the actions in the pictures at the phrase and sentence level with extra time and 100% accuracy. Patient demonstrated appropriate selective attention to task for ~40 minutes with supervision question cues for redirection, however, patient required Max-Total A for initiation and attention to tasks within the  last 15 minutes of session, suspect due to fatigue. Patient left in wheelchair with husband present. Continue with current plan of care.    FIM:  Comprehension Comprehension Mode: Auditory Comprehension: 3-Understands basic 50 - 74% of the time/requires cueing 25 - 50%  of the time Expression Expression Mode: Verbal Expression: 3-Expresses basic 50 - 74% of the time/requires cueing 25 - 50% of the time. Needs to repeat parts of sentences. Social Interaction Social Interaction: 3-Interacts appropriately 50 - 74% of the time - May be physically or verbally inappropriate. Problem Solving Problem Solving: 3-Solves basic 50 - 74% of the time/requires cueing 25 - 49% of the time Memory Memory: 2-Recognizes or recalls 25 - 49% of the time/requires cueing 51 - 75% of the time  Pain Pain Assessment Pain Assessment: No/denies pain  Therapy/Group: Individual Therapy  Shellby Schlink 04/14/2015, 4:21 PM

## 2015-04-14 NOTE — Progress Notes (Signed)
Physical Therapy Session Note  Patient Details  Name: Laura Melendez MRN: [id number] Date of Birth: 1940-12-30  Today's Date: 04/14/2015 PT Individual Time: 1400-1500 PT Individual Time Calculation (min): 60 min   Short Term Goals: Week 2:  PT Short Term Goal 1 (Week 2): Pt will propel w/c 100' w/ hemi technique PT Short Term Goal 2 (Week 2): Pt will ambulate with MinA w/ LRAD consistently PT Short Term Goal 3 (Week 2): Pt will initiate sit>stand w/ min verbal cueing consistently PT Short Term Goal 4 (Week 2): Pt will complete all bed mobility with MinA and min cueing  Skilled Therapeutic Interventions/Progress Updates:    Therapeutic Activity: PT instructs pt in supine to sit transfer from flat bed without rails via longsit req mod A at trunk, and then assist to get R leg off of bed.  PT instructs pt in sit to stand with RW req one-step cues for sequencing and min A sit to stand and stand-step transfer.   Gait Training: PT instructs pt in ambulation x 200' with RW req variable amount of assist, pending on strength of R lean - close SBA-mod A.  PT instructs pt in ascending/descending 3" height stairs with B rails x 8 steps req up to mod A for placement of R leg and R hand - pt demonstrates inattention to R side of body and difficulty overcoming flexor tone to release fingers and push R leg into extension on descent.   W/C Management:  PT instructs pt in w/c propulsion with B UEs for R side NMR and functional mobility req up to max A and single step cues for sequencing x 65' - hand over hand assist.   Pt gets distracted easily in a busy environment and w/c propulsion in the main hallway is difficult due to pt's poor attention to task. Pt's standing balance and gait ability on level surface and stairs is improving. PT demonstrates to husband how he can perform PROM to pt's R fingers, wrist, and elbow safely and he demonstrates understanding - this increases pt's attention to R side of her  body, as well.  Continue per PT POC.   Therapy Documentation Precautions:  Precautions Precautions: Fall Precaution Comments: right hemiparesis, delayed initiation Restrictions Weight Bearing Restrictions: No Pain: Pain Assessment Pain Assessment: No/denies pain   See FIM for current functional status  Therapy/Group: Individual Therapy  Lashawne Dura M 04/14/2015, 8:58 AM

## 2015-04-14 NOTE — Progress Notes (Signed)
ANTICOAGULATION CONSULT NOTE - Follow Up Consult  Pharmacy Consult for coumadin Indication: aortic clot  No Known Allergies  Patient Measurements: Weight: 101 lb 10.1 oz (46.1 kg) Heparin Dosing Weight:   Vital Signs: Temp: 98.3 F (36.8 C) (04/28 0428) Temp Source: Oral (04/28 0428) BP: 116/47 mmHg (04/28 0428) Pulse Rate: 59 (04/28 0428)  Labs:  Recent Labs  04/12/15 0810 04/13/15 0605 04/14/15 0605  LABPROT 31.2* 26.8* 14.5  INR 2.98* 2.45* 1.12  CREATININE 0.54  --   --     Estimated Creatinine Clearance: 44.9 mL/min (by C-G formula based on Cr of 0.54).   Medications:  Scheduled:  . calcium-vitamin D  1 tablet Oral BID WC  . feeding supplement (GLUCERNA SHAKE)  237 mL Oral TID BM  . FLUoxetine  10 mg Oral Daily  . insulin aspart  0-9 Units Subcutaneous TID WC  . lisinopril  2.5 mg Oral Daily  . methylphenidate  5 mg Oral BID WC  . metoprolol  50 mg Oral BID  . multivitamin with minerals  1 tablet Oral Daily  . NIFEdipine  60 mg Oral Daily  . nystatin  5 mL Oral QID  . pravastatin  40 mg Oral q1800  . raloxifene  60 mg Oral Daily  . 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  INR today is 2.12 (previous draw was incorrect); down a little probably due to the 1 mg she got few days ago.  Goal of Therapy:  INR 2-3 Monitor platelets by anticoagulation protocol: Yes   Plan:  - coumadin 2 mg po x1 - INR in am  Coleston Dirosa, [name] 04/14/2015,8:36 AM

## 2015-04-14 NOTE — Progress Notes (Signed)
Occupational Therapy Session Note  Patient Details  Name: Laura Melendez MRN: [id number] Date of Birth: 21-Feb-1941  Today's Date: 04/14/2015 OT Individual Time: 1300-1327 OT Individual Time Calculation (min): 27 min    Short Term Goals: Week 2:  OT Short Term Goal 1 (Week 2): Pt will complete lower body dressing using AE, prn, with min assist OT Short Term Goal 2 (Week 2): Pt will demo ability to complete BUE HEP with supervision OT Short Term Goal 3 (Week 2): Pt will complete 1 grooming task using right hand with min assist OT Short Term Goal 4 (Week 2): Pt will bathe 8 of 10 body parts with min verbal cues to attend to affected side OT Short Term Goal 5 (Week 2): Caregiver will demonstrate ability to provide tactile cues during functional transfers in/out of bed.  Skilled Therapeutic Interventions/Progress Updates:    Pt seen for 1:1 OT session with focus on sitting and standing balance, postural control, and activity tolerance. Pt received supine in bed asleep. Pt required increased time, max cues and total A to initiate supine>sit. Pt sat EOB with eyes closed shaking head "no" to all therapeutic activities offered. Provided wash cloth to wipe face in hopes of increasing arousal, however pt continued to keep eyes closed. Completed sit<>stand 5x total with max cues and min A. Pt opening eyes upon standing then immediately closing again. Completed scooting in bed with mod A in preparation for sit>supine. Pt left supine in bed with all needs in reach.  Therapy Documentation Precautions:  Precautions Precautions: Fall Precaution Comments: right hemiparesis, delayed initiation Restrictions Weight Bearing Restrictions: No General:   Vital Signs:  Pain: Pain Assessment Pain Assessment: No/denies pain  See FIM for current functional status  Therapy/Group: Individual Therapy  Duayne Cal 04/14/2015, 2:44 PM

## 2015-04-14 NOTE — Progress Notes (Signed)
Occupational Therapy Session Note  Patient Details  Name: Laura Melendez MRN: [id number] Date of Birth: 1941/09/08  Today's Date: 04/14/2015 OT Individual Time: 1100-1200 OT Individual Time Calculation (min): 60 min    Short Term Goals: Week 2:  OT Short Term Goal 1 (Week 2): Pt will complete lower body dressing using AE, prn, with min assist OT Short Term Goal 2 (Week 2): Pt will demo ability to complete BUE HEP with supervision OT Short Term Goal 3 (Week 2): Pt will complete 1 grooming task using right hand with min assist OT Short Term Goal 4 (Week 2): Pt will bathe 8 of 10 body parts with min verbal cues to attend to affected side OT Short Term Goal 5 (Week 2): Caregiver will demonstrate ability to provide tactile cues during functional transfers in/out of bed.  Skilled Therapeutic Interventions/Progress Updates:    Pt seen for OT ADL session focusing on initiation of tasks. Pt in w/c upon arrival, agreeable to tx. Pt completed bathing and grooming session at the sink. Pt required max verbal cues to initiate self care tasks. Tactile cues provided to initiate task and to initiate functional use of R UE. Pt able to complete self care tasks following assist to initiate.  Pt with ataxic like tendencies throughout session, as she was able to verbalize needed steps of putting on pants and brushing teeth, however, was unable to initiate motor movements to complete task. Pt stood to complete face washing, pericare/buttock hygiene and to pull pants up with steadying assist and blocking of R knee, demonstrating functional static standing tolerance/ endurance. Pt required max A for LB dressing to initate threading of LEs and to pull pants up. Pt able to verbalize "I need to put my leg through the hole", however was unable to perform action.    Pt voiced fatigue throughout session, closing her eyes during tasks, however, was agreeable to complete grooming/ dressing session.    Pt brought to nursing  station at end of session for supervision with safety belt donned .  Therapy Documentation Precautions:  Precautions Precautions: Fall Precaution Comments: right hemiparesis, delayed initiation Restrictions Weight Bearing Restrictions: No Pain: Pain Assessment Pain Assessment: No/denies pain  See FIM for current functional status  Therapy/Group: Individual Therapy  Lewis, Joletta Manner C 04/14/2015, 12:20 PM

## 2015-04-15 ENCOUNTER — Inpatient Hospital Stay (HOSPITAL_COMMUNITY): Payer: Medicare HMO | Admitting: Physical Therapy

## 2015-04-15 ENCOUNTER — Inpatient Hospital Stay (HOSPITAL_COMMUNITY): Payer: Medicare HMO

## 2015-04-15 ENCOUNTER — Inpatient Hospital Stay (HOSPITAL_COMMUNITY): Payer: Medicare HMO | Admitting: Speech Pathology

## 2015-04-15 DIAGNOSIS — F329 Major depressive disorder, single episode, unspecified: Secondary | ICD-10-CM

## 2015-04-15 DIAGNOSIS — F0280 Dementia in other diseases classified elsewhere without behavioral disturbance: Secondary | ICD-10-CM

## 2015-04-15 LAB — PROTIME-INR
INR: 2.44 — ABNORMAL HIGH (ref 0.00–1.49)
Prothrombin Time: 26.7 seconds — ABNORMAL HIGH (ref 11.6–15.2)

## 2015-04-15 LAB — GLUCOSE, CAPILLARY
GLUCOSE-CAPILLARY: 116 mg/dL — AB (ref 70–99)
GLUCOSE-CAPILLARY: 194 mg/dL — AB (ref 70–99)
GLUCOSE-CAPILLARY: 80 mg/dL (ref 70–99)
Glucose-Capillary: 109 mg/dL — ABNORMAL HIGH (ref 70–99)

## 2015-04-15 MED ORDER — WARFARIN SODIUM 2 MG PO TABS
2.0000 mg | ORAL_TABLET | Freq: Every day | ORAL | Status: DC
  Administered 2015-04-15 – 2015-04-21 (×7): 2 mg via ORAL
  Filled 2015-04-15 (×8): qty 1

## 2015-04-15 MED ORDER — SENNOSIDES-DOCUSATE SODIUM 8.6-50 MG PO TABS
2.0000 | ORAL_TABLET | Freq: Every day | ORAL | Status: DC
  Administered 2015-04-15: 2 via ORAL
  Filled 2015-04-15: qty 2

## 2015-04-15 NOTE — Progress Notes (Signed)
Speech Language Pathology Daily Session Note  Patient Details  Name: LOUISA FAVARO MRN: [id number] Date of Birth: 1941-03-19  Today's Date: 04/15/2015 SLP Individual Time: 1400-1500 SLP Individual Time Calculation (min): 60 min  Short Term Goals: Week 2: SLP Short Term Goal 1 (Week 2): Patient will consume least restrictive diet without overt s/s of aspiration and while demonstrating a timely swallow initiation given Mod A verbal and visual in 75% of opportunities. SLP Short Term Goal 2 (Week 2): Patient will verbally express wants and needs in functional communication opportunities given Mod A verbal cues in 75% of opportunities. SLP Short Term Goal 3 (Week 2): Patient will demonstrate basic problem-solving with functional tasks given Max A verbal and visual cues in 75% of opportunities. SLP Short Term Goal 4 (Week 2): Patient will demonstrate selective attention in a mildly distracting environment for ~5 minutes given Max A verbal and visual cues. SLP Short Term Goal 5 (Week 2): Patient will identify 2 new cognitive deficits and 2 new physical deficits given Mod A question cues. SLP Short Term Goal 6 (Week 2): Patient will demonstrate initiation with functional tasks given Mod A verbal, visual, and tactile cues in 75% of opportunities.  Skilled Therapeutic Interventions: Skilled treatment session focused on dysphagia and cognitive goals. Upon arrival, patient was awake while sitting upright in the wheelchair.  Patient consumed lunch meal of Dys. 1 textures with thin liquids without overt s/s of aspiration and without oral holding. Patient initiated meal with supervision verbal and visual cue and demonstrated selective attention to self-feeding in a mildly distracting environment for 40 minutes with supervision verbal cues. Patient initiated conversation with clinician in regards to hobbies and interests and was 100% intelligible without evidence of aphasia and was able to recall events from  morning therapy sessions with supervision question cues. Patient left upright in wheelchair with family present. Continue with current plan of care.    FIM:  Comprehension Comprehension Mode: Auditory Comprehension: 4-Understands basic 75 - 89% of the time/requires cueing 10 - 24% of the time Expression Expression Mode: Verbal Expression: 4-Expresses basic 75 - 89% of the time/requires cueing 10 - 24% of the time. Needs helper to occlude trach/needs to repeat words. Social Interaction Social Interaction: 3-Interacts appropriately 50 - 74% of the time - May be physically or verbally inappropriate. Problem Solving Problem Solving: 3-Solves basic 50 - 74% of the time/requires cueing 25 - 49% of the time Memory Memory: 3-Recognizes or recalls 50 - 74% of the time/requires cueing 25 - 49% of the time FIM - Eating Eating Activity: 5: Set-up assist for open containers;5: Needs verbal cues/supervision  Pain Pain Assessment Pain Assessment: No/denies pain  Therapy/Group: Individual Therapy  Conda Wannamaker 04/15/2015, 4:22 PM

## 2015-04-15 NOTE — Progress Notes (Signed)
ANTICOAGULATION CONSULT NOTE - Follow Up Consult  Pharmacy Consult for coumadin Indication: aortic clot  No Known Allergies  Patient Measurements: Weight: 101 lb 10.1 oz (46.1 kg) Heparin Dosing Weight:   Vital Signs: Temp: 98.1 F (36.7 C) (04/29 0453) Temp Source: Oral (04/29 0453) BP: 154/60 mmHg (04/29 0453) Pulse Rate: 65 (04/29 0854)  Labs:  Recent Labs  04/14/15 0605 04/14/15 0907 04/15/15 0735  LABPROT 14.5 23.9* 26.7*  INR 1.12 2.12* 2.44*    Estimated Creatinine Clearance: 44.9 mL/min (by C-G formula based on Cr of 0.54).   Medications:  Scheduled:  . calcium-vitamin D  1 tablet Oral BID WC  . feeding supplement (GLUCERNA SHAKE)  237 mL Oral TID BM  . FLUoxetine  10 mg Oral Daily  . insulin aspart  0-9 Units Subcutaneous TID WC  . lisinopril  2.5 mg Oral Daily  . methylphenidate  5 mg Oral BID WC  . metoprolol  50 mg Oral BID  . multivitamin with minerals  1 tablet Oral Daily  . NIFEdipine  60 mg Oral Daily  . nystatin  5 mL Oral QID  . pravastatin  40 mg Oral q1800  . raloxifene  60 mg Oral Daily  . 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  INR today is 2.44. Most recent CBC on 4/16. hgb 13.3, plt wnl   Goal of Therapy:  INR 2-3 Monitor platelets by anticoagulation protocol: Yes   Plan:  - coumadin 2 mg po daily - Change INR to MWF - CBC Monday AM  Maryanna Shape, PharmD, BCPS  Clinical Pharmacist  Pager: [phone]   04/15/2015,1:52 PM

## 2015-04-15 NOTE — Progress Notes (Signed)
Physical Therapy Session Note  Patient Details  Name: Laura Melendez MRN: [id number] Date of Birth: May 29, 1941  Today's Date: 04/15/2015 PT Individual Time: 1300-1400 Treatment Session 2: 4098-1191 PT Individual Time Calculation (min): 60 min  Treatment Session 2: 40 min  Short Term Goals: Week 2:  PT Short Term Goal 1 (Week 2): Pt will propel w/c 100' w/ hemi technique PT Short Term Goal 2 (Week 2): Pt will ambulate with MinA w/ LRAD consistently PT Short Term Goal 3 (Week 2): Pt will initiate sit>stand w/ min verbal cueing consistently PT Short Term Goal 4 (Week 2): Pt will complete all bed mobility with MinA and min cueing  Skilled Therapeutic Interventions/Progress Updates:    Treatment Session 1: Therapeutic Activity: PT instructs pt in rolling L in flat bed without rail req min A and in L side lie to sit transfer req mod A, and in stand-step transfer bed to w/c without AD req min A.  PT instructs pt in toilet transfer with grab bar req min A to transfer, tot A to change brief and for pants down/up. Pt completes anterior hygiene, but req tot A for posterior hygiene.   Gait Training: PT instructs pt in ambulation with RW x 250' req SBA-min A for balance prn - pt leans R at times, but more problematic to her balance is decreased R foot clearance/step length, which causes her body to pitch forward and she has a hard time slowing down the forward momentum of her body.  PT instructs pt in ascending/descending 4 stair (6" height with B rails) req min A and increased time due to apraxia - pt alternates which leg she leads, but completes it in a step-to pattern.   W/C Management: PT instructs pt in w/c propulsion with B LEs in lightweight manual w/c x 200' req up to mod A for attention to task and steering due to R side weak/un-coordinated.   Neuromuscular Reeducation: PT instructs pt in TUG with RW and pt scores: 65 seconds, 46 seconds, and 75 seconds.   Treatment Session 2: Gait  Training: PT initiates family training with husband, instructing him in ambulation with RW with pt and up/down low 3 stairs with B rails (x 2 reps) req min-mod A prn.   Therapeutic Activity: Pt reports she needs to urinate prior to 2nd session, and so PT instructs pt in toilet transfer with grab bar req min A, PT doffs/dons pants for pt as she attempts to help but is unable, and PT changes brief for pt. Pt is able to complete anterior hygiene with set up assist. PT instructs pt in washing her hands in stand at the sink req single step cues and CGA for balance - more R UE use noted during this automatic activity.   Family training with husband has been initiated. Continue per PT POC.     Therapy Documentation Precautions:  Precautions Precautions: Fall Precaution Comments: right hemiparesis, delayed initiation Restrictions Weight Bearing Restrictions: No  Pain: Pain Assessment Pain Assessment: No/denies pain Treatment Session 2: Pt denies pain  Balance: Balance Balance Assessed: Yes Standardized Balance Assessment Standardized Balance Assessment: Timed Up and Go Test Timed Up and Go Test TUG: Normal TUG Normal TUG (seconds): 46  See FIM for current functional status  Therapy/Group: Individual Therapy  Kamiah Fite M 04/15/2015, 8:53 AM

## 2015-04-15 NOTE — Progress Notes (Signed)
Occupational Therapy Session Note  Patient Details  Name: Laura Melendez MRN: [id number] Date of Birth: 1941/04/19  Today's Date: 04/15/2015 OT Individual Time: 0930-1030 OT Individual Time Calculation (min): 60 min   Short Term Goals: Week 2:  OT Short Term Goal 1 (Week 2): Pt will complete lower body dressing using AE, prn, with min assist OT Short Term Goal 2 (Week 2): Pt will demo ability to complete BUE HEP with supervision OT Short Term Goal 3 (Week 2): Pt will complete 1 grooming task using right hand with min assist OT Short Term Goal 4 (Week 2): Pt will bathe 8 of 10 body parts with min verbal cues to attend to affected side OT Short Term Goal 5 (Week 2): Caregiver will demonstrate ability to provide tactile cues during functional transfers in/out of bed.  Skilled Therapeutic Interventions/Progress Updates: ADL-retraining with focus on improved sequencing, initiation, adapted bathing and dressing skills, and NMR of RUE.   Pt received seated in her w/c without her husband present during this session.   Pt demo'd improved communication and awareness and receptive for planned task, bathing and dressing at shower level.   Pt ambulated to bathroom with contact guard at right side, hand-held for guidance and attention to right.   Pt completed stand-pivot transfer to tub bench in walk-in shower with extra time and manual facilitation to place right LE.  With mod assist to remove gown and socks, pt bathed washing her face, chest, stomach, peri-area, upper legs and right lower leg with extra time, mod vc and intermittent hand-guidance to progress through sequence d/t delayed initiation.   Pt attempts to use right hand and requested wash mit but perseverates with mit once it was placed on her right hand and improved performance when mit was switched to left hand.   Pt ambulated to standard chair placed at sink to dress in order to maintain foot contact with floor for improved stability while seated.    Pt dressed with overall max assist d/t time limitation although following one-step commands and attempting to use RUE throughout session.    Pt requested return to bed to rest at end of session and required only mod assist to bring both legs into bed and to reposition for comfort.   Call light placed within reach.      Therapy Documentation Precautions:  Precautions Precautions: Fall Precaution Comments: right hemiparesis, delayed initiation Restrictions Weight Bearing Restrictions: No   Vital Signs: Therapy Vitals Temp: 98.1 F (36.7 C) Temp Source: Oral Pulse Rate: 70 Resp: 18 BP: (!) 123/48 mmHg Patient Position (if appropriate): Sitting Oxygen Therapy SpO2: 98 % O2 Device: Not Delivered   Pain: Pain Assessment Pain Assessment: No/denies pain  See FIM for current functional status  Therapy/Group: Individual Therapy  [place] 04/15/2015, 4:08 PM

## 2015-04-15 NOTE — Progress Notes (Signed)
Nursing Note: Pt resting quietly.wbb

## 2015-04-15 NOTE — Progress Notes (Signed)
Nursing Note: Husband at bedside and states pt is having bad stomach pain.A: Pt says her stomach hurts nut uanble to describe.Pt denies sharp pain or nausea.Asked when the last time was that pt had a good stool and Husband did not know.A: Used surgical lubricant and gently checked rectal vault and pt had firm,balls and unable to pass.Gently removed a medium amount of barium colored firm balls,all balls.Pt immediately felt better.Pt given a senna and 2 tylenol.wbb

## 2015-04-15 NOTE — Progress Notes (Signed)
Nursing Note: Pt up to the bathroom and sat along time trying to have a stool but unable to.A; Dulcolax supp. Given.wbb

## 2015-04-15 NOTE — Progress Notes (Signed)
[facility] PHYSICAL MEDICINE & REHABILITATION     PROGRESS NOTE    Subjective/Complaints: A little constipated. RN worked on bowels last night.   ROS still somewhat limited due to language. Husband at bedside  Objective: Vital Signs: Blood pressure 154/60, pulse 53, temperature 98.1 F (36.7 C), temperature source Oral, resp. rate 18, weight 46.1 kg (101 lb 10.1 oz), SpO2 98 %. No results found. No results for input(s): WBC, HGB, HCT, PLT in the last 72 hours. No results for input(s): NA, K, CL, GLUCOSE, BUN, CREATININE, CALCIUM in the last 72 hours.  Invalid input(s): CO CBG (last 3)   Recent Labs  04/14/15 1657 04/14/15 2101 04/15/15 0656  GLUCAP 136* 215* 116*    Wt Readings from Last 3 Encounters:  04/05/15 46.1 kg (101 lb 10.1 oz)  02/15/15 45.813 kg (101 lb)  01/31/15 44.7 kg (98 lb 8.7 oz)    Physical Exam:  Constitutional: She appears alert, no distress. HENT: mild coating of thrush on the tongue Head: Normocephalic and atraumatic.  Eyes: Conjunctivae are normal. Pupils are equal, round, and reactive to light.  Neck: Normal range of motion. Neck supple.  Cardiovascular: Normal rate and regular rhythm. no murmur Respiratory: Effort normal and breath sounds normal. No respiratory distress. She has no wheezes.  GI: Soft. Bowel sounds are normal.  Musculoskeletal: She exhibits no edema or tenderness.  Neurological: She is alert. RUE:  Language improving--initiating conversation/sentences---better when given a lead in/cue . RUE with spontaneous movement/voluntary effort now---MMT 2- to 2+ RUE. RLE: 2 to 2+ but inconsistent due to apraxia. Underlying tone this am in flexion of leg---broke once leg extended    DTR's 3+. Right central 7 and tongue deviation.   No withdraw to pain RUE and RLE.  M/S: left shoulder less tender with Passive ABD/IR/ER Psych: more dynamic Skin: Skin is warm and dry. No breakdown  Assessment/Plan: 1. Functional deficits secondary  to left frontal infarct which require 3+ hours per day of interdisciplinary therapy in a comprehensive inpatient rehab setting. Physiatrist is providing close team supervision and 24 hour management of active medical problems listed below. Physiatrist and rehab team continue to assess barriers to discharge/monitor patient progress toward functional and medical goals.  FIM: FIM - Bathing Bathing Steps Patient Completed: Front perineal area, Buttocks Bathing: 4: Steadying assist  FIM - Upper Body Dressing/Undressing Upper body dressing/undressing steps patient completed: Thread/unthread left sleeve of pullover shirt/dress Upper body dressing/undressing: 2: Max-Patient completed 25-49% of tasks FIM - Lower Body Dressing/Undressing Lower body dressing/undressing: 1: Total-Patient completed less than 25% of tasks  FIM - Toileting Toileting steps completed by patient: Performs perineal hygiene, Adjust clothing after toileting Toileting: 3: Mod-Patient completed 2 of 3 steps  FIM - Radio producer Devices: Elevated toilet seat Toilet Transfers: 2-From toilet/BSC: Max A (lift and lower assist)  FIM - Control and instrumentation engineer Devices: [place], Arm rests Bed/Chair Transfer: 3: Sit > Supine: Mod A (lifting assist/Pt. 50-74%/lift 2 legs), 4: Chair or W/C > Bed: Min A (steadying Pt. > 75%), 4: Bed > Chair or W/C: Min A (steadying Pt. > 75%)  FIM - Locomotion: Wheelchair Distance: 65 Locomotion: Wheelchair: 2: Travels 50 - 149 ft with maximal assistance (Pt: 25 - 49%) FIM - Locomotion: Ambulation Locomotion: Ambulation Assistive Devices: Administrator Ambulation/Gait Assistance: 3: Mod assist Locomotion: Ambulation: 3: Travels 150 ft or more with moderate assistance (Pt: 50 - 74%)  Comprehension Comprehension Mode: Auditory Comprehension: 3-Understands basic 50 - 74%  of the time/requires cueing 25 - 50%  of the time  Expression Expression  Mode: Verbal Expression: 3-Expresses basic 50 - 74% of the time/requires cueing 25 - 50% of the time. Needs to repeat parts of sentences.  Social Interaction Social Interaction: 3-Interacts appropriately 50 - 74% of the time - May be physically or verbally inappropriate.  Problem Solving Problem Solving: 3-Solves basic 50 - 74% of the time/requires cueing 25 - 49% of the time  Memory Memory: 2-Recognizes or recalls 25 - 49% of the time/requires cueing 51 - 75% of the time  Medical Problem List and Plan: 1. 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 Thoracic aortic and supraceliac aortic thrombus/Anticoagulation: Pharmaceutical: Coumadin. To follow up with Dr. Bridgett Larsson in 6 month for repeat CTA chest/abd/pelvis.  3. Pain Management: Tylenol prn 4. Mood:   prozac to help with mood. Team to provide ego support. LCSW to follow for evaluation and support.  5. Neuropsych: This patient is capable of making decisions on his own behalf.  -ritalin trial to help with initiation/processing 6. Skin/Wound Care: Routine pressure relief measures. Maintain adequate nutrition and hydration status  -allevyn dressing for sacrum.  7. Fluids/Electrolytes/Nutrition:     -encourage po fluids as possible  -intake improving  -re-check bmet monday  -continue ritalin to help with initiation (may help with eating too) 8. HTN: Monitor BP every 8 hours. Continue Lopressor bid, and procardia XL. Avoid hypotension.  9. Dyslipidemia: On lovaza and Pravachol.  10 DM type 2: Hgb A1c- 6.9. Was on metformin  500mg  bid at home. May need to resume at low dose soon  - Monitor BS ac/hs and use SSI for elevated BS.  11. Osteoporosis: Continue Evista. Resume fosamax at discharge.  12. Dysphagia:  .  -D1/thins per SLP  LOS (Days) 13 A FACE TO FACE EVALUATION WAS PERFORMED  Laura Melendez T 04/15/2015 8:25 AM

## 2015-04-16 ENCOUNTER — Inpatient Hospital Stay (HOSPITAL_COMMUNITY): Payer: Medicare HMO | Admitting: Speech Pathology

## 2015-04-16 DIAGNOSIS — K5901 Slow transit constipation: Secondary | ICD-10-CM

## 2015-04-16 LAB — GLUCOSE, CAPILLARY
GLUCOSE-CAPILLARY: 144 mg/dL — AB (ref 70–99)
Glucose-Capillary: 120 mg/dL — ABNORMAL HIGH (ref 70–99)
Glucose-Capillary: 126 mg/dL — ABNORMAL HIGH (ref 70–99)
Glucose-Capillary: 96 mg/dL (ref 70–99)

## 2015-04-16 MED ORDER — SENNOSIDES-DOCUSATE SODIUM 8.6-50 MG PO TABS
2.0000 | ORAL_TABLET | Freq: Two times a day (BID) | ORAL | Status: DC
  Administered 2015-04-16 – 2015-04-22 (×12): 2 via ORAL
  Filled 2015-04-16 (×13): qty 2

## 2015-04-16 MED ORDER — FLEET ENEMA 7-19 GM/118ML RE ENEM: 1.0000 | ENEMA | Freq: Once | RECTAL | Status: DC

## 2015-04-16 NOTE — Progress Notes (Signed)
Speech Language Pathology Daily Session Note  Patient Details  Name: Laura Melendez MRN: [id number] Date of Birth: 1941-01-27  Today's Date: 04/16/2015 SLP Individual Time: 6503-5465 SLP Individual Time Calculation (min): 48 min  Short Term Goals: Week 2: SLP Short Term Goal 1 (Week 2): Patient will consume least restrictive diet without overt s/s of aspiration and while demonstrating a timely swallow initiation given Mod A verbal and visual in 75% of opportunities. SLP Short Term Goal 2 (Week 2): Patient will verbally express wants and needs in functional communication opportunities given Mod A verbal cues in 75% of opportunities. SLP Short Term Goal 3 (Week 2): Patient will demonstrate basic problem-solving with functional tasks given Max A verbal and visual cues in 75% of opportunities. SLP Short Term Goal 4 (Week 2): Patient will demonstrate selective attention in a mildly distracting environment for ~5 minutes given Max A verbal and visual cues. SLP Short Term Goal 5 (Week 2): Patient will identify 2 new cognitive deficits and 2 new physical deficits given Mod A question cues. SLP Short Term Goal 6 (Week 2): Patient will demonstrate initiation with functional tasks given Mod A verbal, visual, and tactile cues in 75% of opportunities.  Skilled Therapeutic Interventions: Skilled treatment session focused on cognition goals.  SLP facilitated session by providing mod A for patient to initiate within functional task of meal planning a simple meal of listing ingredients, the approximate cost of the ingredients, and providing the steps of creating the meal.  She required extra time and intermittent-frequent verbal prompts to initiate the task.  Patient required mod-max A  calculating the approximate cost of the meal and comparing the cost to a meal out.  Patient attended to the task with mild distractions from open door of room with min-mod A; pt required verbal prompts for redirection to attend  toward task.  Continue plan of care.   FIM:  Comprehension Comprehension Mode: Auditory Comprehension: 4-Understands basic 75 - 89% of the time/requires cueing 10 - 24% of the time Expression Expression Mode: Verbal Expression: 4-Expresses basic 75 - 89% of the time/requires cueing 10 - 24% of the time. Needs helper to occlude trach/needs to repeat words. Social Interaction Social Interaction: 4-Interacts appropriately 75 - 89% of the time - Needs redirection for appropriate language or to initiate interaction. Problem Solving Problem Solving: 3-Solves basic 50 - 74% of the time/requires cueing 25 - 49% of the time Memory Memory: 3-Recognizes or recalls 50 - 74% of the time/requires cueing 25 - 49% of the time  Pain Pain Assessment Pain Assessment: No/denies pain Pain Score: 0-No pain  Therapy/Group: Individual Therapy   Annabell Howells, MA CCC-SLP Annabell Howells A 04/16/2015, 4:34 PM

## 2015-04-16 NOTE — Progress Notes (Signed)
[facility] PHYSICAL MEDICINE & REHABILITATION     PROGRESS NOTE    Subjective/Complaints: Remains constipated, partial relief from stool softener ROS still somewhat limited due to language. Husband at bedside  Objective: Vital Signs: Blood pressure 123/52, pulse 66, temperature 97.5 F (36.4 C), temperature source Oral, resp. rate 18, weight 46.1 kg (101 lb 10.1 oz), SpO2 96 %. No results found. No results for input(s): WBC, HGB, HCT, PLT in the last 72 hours. No results for input(s): NA, K, CL, GLUCOSE, BUN, CREATININE, CALCIUM in the last 72 hours.  Invalid input(s): CO CBG (last 3)   Recent Labs  04/15/15 1644 04/15/15 2129 04/16/15 0635  GLUCAP 194* 80 120*    Wt Readings from Last 3 Encounters:  04/05/15 46.1 kg (101 lb 10.1 oz)  02/15/15 45.813 kg (101 lb)  01/31/15 44.7 kg (98 lb 8.7 oz)    Physical Exam:  Constitutional: She appears alert, no distress. HENT: mild coating of thrush on the tongue Head: Normocephalic and atraumatic.  Eyes: Conjunctivae are normal. Pupils are equal, round, and reactive to light.  Neck: Normal range of motion. Neck supple.  Cardiovascular: Normal rate and regular rhythm. no murmur Respiratory: Effort normal and breath sounds normal. No respiratory distress. She has no wheezes.  GI: Soft. Bowel sounds are normal.  Musculoskeletal: She exhibits no edema or tenderness.  Neurological: She is alert. RUE:  Language improving--initiating conversation/sentences---better when given a lead in/cue . RUE with spontaneous movement/voluntary effort now---MMT 2- to 2+ RUE. RLE: 2 to 2+ but inconsistent due to apraxia. Underlying tone this am in flexion of leg---broke once leg extended    DTR's 3+. Right central 7 and tongue deviation.   No withdraw to pain RUE and RLE.  M/S: no pain with range of motion right upper extremity Psych: more dynamic Skin: Skin is warm and dry. No breakdown  Assessment/Plan: 1. 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 Physiatrist is providing close team supervision and 24 hour management of active medical problems listed below. Physiatrist and rehab team continue to assess barriers to discharge/monitor patient progress toward functional and medical goals.  FIM: FIM - Bathing Bathing Steps Patient Completed: Right Arm, Chest, Abdomen, Front perineal area, Right upper leg, Right lower leg (including foot) Bathing: 3: Mod-Patient completes 5-7 35f 10 parts or 50-74%  FIM - Upper Body Dressing/Undressing Upper body dressing/undressing steps patient completed: Thread/unthread left bra strap, Thread/unthread left sleeve of pullover shirt/dress Upper body dressing/undressing: 2: Max-Patient completed 25-49% of tasks FIM - Lower Body Dressing/Undressing Lower body dressing/undressing: 1: Total-Patient completed less than 25% of tasks  FIM - Toileting Toileting steps completed by patient: Performs perineal hygiene, Adjust clothing after toileting Toileting Assistive Devices: Grab bar or rail for support Toileting: 3: Mod-Patient completed 2 of 3 steps  FIM - Radio producer Devices: Grab bars Toilet Transfers: 4-To toilet/BSC: Min A (steadying Pt. > 75%), 4-From toilet/BSC: Min A (steadying Pt. > 75%)  FIM - Bed/Chair Transfer Bed/Chair Transfer Assistive Devices: [place], Arm rests Bed/Chair Transfer: 3: Supine > Sit: Mod A (lifting assist/Pt. 50-74%/lift 2 legs, 4: Sit > Supine: Min A (steadying pt. > 75%/lift 1 leg), 4: Bed > Chair or W/C: Min A (steadying Pt. > 75%), 4: Chair or W/C > Bed: Min A (steadying Pt. > 75%)  FIM - Locomotion: Wheelchair Distance: 150 Locomotion: Wheelchair: 3: Travels 150  ft or more: maneuvers on rugs and over door sills with moderate assistance  (Pt: 50 - 74%) FIM - Locomotion:  Ambulation Locomotion: Ambulation Assistive Devices: [place] - Rolling Ambulation/Gait Assistance: 4: Min assist Locomotion: Ambulation: 4: Travels 150 ft or more with minimal assistance (Pt.>75%)  Comprehension Comprehension Mode: Auditory Comprehension: 4-Understands basic 75 - 89% of the time/requires cueing 10 - 24% of the time  Expression Expression Mode: Verbal Expression: 4-Expresses basic 75 - 89% of the time/requires cueing 10 - 24% of the time. Needs helper to occlude trach/needs to repeat words.  Social Interaction Social Interaction: 3-Interacts appropriately 50 - 74% of the time - May be physically or verbally inappropriate.  Problem Solving Problem Solving: 3-Solves basic 50 - 74% of the time/requires cueing 25 - 49% of the time  Memory Memory: 3-Recognizes or recalls 50 - 74% of the time/requires cueing 25 - 49% of the time  Medical Problem List and Plan: 1. 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 Thoracic aortic and supraceliac aortic thrombus/Anticoagulation: Pharmaceutical: Coumadin. To follow up with Dr. Bridgett Larsson in 6 month for repeat CTA chest/abd/pelvis.  3. Pain Management: Tylenol prn 4. Mood:   prozac to help with mood. Team to provide ego support. LCSW to follow for evaluation and support.  5. Neuropsych: This patient is capable of making decisions on his own behalf.  -ritalin trial to help with initiation/processing 6. Skin/Wound Care: Routine pressure relief measures. Maintain adequate nutrition and hydration status  -allevyn dressing for sacrum.  7. Fluids/Electrolytes/Nutrition:     -encourage po fluids as possible  -intake improving  -re-check bmet monday  -continue ritalin to help with initiation (may help with eating too) 8. HTN: Monitor BP every 8 hours. Continue Lopressor bid, and procardia XL. Avoid hypotension.  9. Dyslipidemia: On lovaza and Pravachol.  10 DM type 2: Hgb A1c-  6.9. Was on metformin  500mg  bid at home. May need to resume at low dose soon  - Monitor BS ac/hs and use SSI for elevated BS.  11. Osteoporosis: Continue Evista. Resume fosamax at discharge.  12. Dysphagia:  .  -D1/thins per SLP 13. Constipation augmented bowel program LOS (Days) 14 A FACE TO FACE EVALUATION WAS PERFORMED  Alysia Penna E 04/16/2015 10:35 AM

## 2015-04-16 NOTE — Plan of Care (Signed)
Problem: RH BOWEL ELIMINATION Goal: RH STG MANAGE BOWEL WITH ASSISTANCE STG Manage Bowel with Assistance.Husband able to manage bowel care by d/c as evidenced by teach-back.  Outcome: Not Progressing Patient's constipated new order noted

## 2015-04-17 ENCOUNTER — Inpatient Hospital Stay (HOSPITAL_COMMUNITY): Payer: Medicare HMO

## 2015-04-17 LAB — GLUCOSE, CAPILLARY
Glucose-Capillary: 121 mg/dL — ABNORMAL HIGH (ref 70–99)
Glucose-Capillary: 90 mg/dL (ref 70–99)
Glucose-Capillary: 160 mg/dL — ABNORMAL HIGH (ref 70–99)
Glucose-Capillary: 107 mg/dL — ABNORMAL HIGH (ref 70–99)

## 2015-04-17 NOTE — Progress Notes (Signed)
Social Work Patient ID: Laura Melendez, female   DOB: Jan 14, 1941, 74 y.o.   MRN: [id number]   CSW met with pt and her husband to update them to on team conference discussion.  Pt and husband continue to feel comfortable with d/c date of 04-22-15.  Husband is concerned about pt's fatigue, but when she is not tired, he is encouraged by her progress.  CSW has encouraged him to take breaks when he can.  Pt is able to communicate with CSW more.  CSW will continue to follow and assist as needed.

## 2015-04-17 NOTE — Progress Notes (Signed)
[place] PHYSICAL MEDICINE & REHABILITATION     PROGRESS NOTE    Subjective/Complaints: Remains constipated, partial relief from stool softener ROS still somewhat limited due to aphasia, apraxia  Objective: Vital Signs: Blood pressure 107/81, pulse 55, temperature 98.6 F (37 C), temperature source Oral, resp. rate 16, weight 46.1 kg (101 lb 10.1 oz), SpO2 99 %. No results found. No results for input(s): WBC, HGB, HCT, PLT in the last 72 hours. No results for input(s): NA, K, CL, GLUCOSE, BUN, CREATININE, CALCIUM in the last 72 hours.  Invalid input(s): CO CBG (last 3)   Recent Labs  04/16/15 1633 04/16/15 2105 04/17/15 0631  GLUCAP 96 144* 121*    Wt Readings from Last 3 Encounters:  04/05/15 46.1 kg (101 lb 10.1 oz)  02/15/15 45.813 kg (101 lb)  01/31/15 44.7 kg (98 lb 8.7 oz)    Physical Exam:  Constitutional: She appears alert, no distress. HENT: mild coating of thrush on the tongue Head: Normocephalic and atraumatic.  Eyes: Conjunctivae are normal. Pupils are equal, round, and reactive to light.  Neck: Normal range of motion. Neck supple.  Cardiovascular: Normal rate and regular rhythm. no murmur Respiratory: Effort normal and breath sounds normal. No respiratory distress. She has no wheezes.  GI: Soft. Bowel sounds are normal.  Musculoskeletal: She exhibits no edema or tenderness.  Neurological: She is alert. RUE:  Language improving--initiating conversation/sentences---better when given a lead in/cue . RUE with spontaneous movement/voluntary effort now---MMT 2- to 2+ RUE. RLE: 2 to 2+ but inconsistent due to apraxia.   DTR's 3+. Right central 7 and tongue deviation.   No withdraw to pain RUE and RLE.  M/S: no pain with range of motion right upper extremity Psych: more dynamic Skin: Skin is warm and dry. No breakdown  Assessment/Plan: 1. 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 Physiatrist is providing close team supervision and 24 hour management of active medical problems listed below. Physiatrist and rehab team continue to assess barriers to discharge/monitor patient progress toward functional and medical goals.  FIM: FIM - Bathing Bathing Steps Patient Completed: Right Arm, Chest, Abdomen, Front perineal area, Right upper leg, Right lower leg (including foot) Bathing: 3: Mod-Patient completes 5-7 54f 10 parts or 50-74%  FIM - Upper Body Dressing/Undressing Upper body dressing/undressing steps patient completed: Thread/unthread left bra strap, Thread/unthread left sleeve of pullover shirt/dress Upper body dressing/undressing: 2: Max-Patient completed 25-49% of tasks FIM - Lower Body Dressing/Undressing Lower body dressing/undressing: 1: Total-Patient completed less than 25% of tasks  FIM - Toileting Toileting steps completed by patient: Performs perineal hygiene, Adjust clothing after toileting Toileting Assistive Devices: Grab bar or rail for support Toileting: 3: Mod-Patient completed 2 of 3 steps  FIM - Radio producer Devices: Grab bars Toilet Transfers: 4-To toilet/BSC: Min A (steadying Pt. > 75%), 4-From toilet/BSC: Min A (steadying Pt. > 75%)  FIM - Bed/Chair Transfer Bed/Chair Transfer Assistive Devices: Gladu, Arm rests Bed/Chair Transfer: 3: Supine > Sit: Mod A (lifting assist/Pt. 50-74%/lift 2 legs, 4: Sit > Supine: Min A (steadying pt. > 75%/lift 1 leg), 4: Bed > Chair or W/C: Min A (steadying Pt. > 75%), 4: Chair or W/C > Bed: Min A (steadying Pt. > 75%)  FIM - Locomotion: Wheelchair Distance: 150 Locomotion: Wheelchair: 3: Travels 150 ft or more: maneuvers on rugs and over door sills with moderate assistance  (  Pt: 50 - 74%) FIM - Locomotion: Ambulation Locomotion: Ambulation Assistive Devices: Lackie - Rolling Ambulation/Gait  Assistance: 4: Min assist Locomotion: Ambulation: 4: Travels 150 ft or more with minimal assistance (Pt.>75%)  Comprehension Comprehension Mode: Auditory Comprehension: 4-Understands basic 75 - 89% of the time/requires cueing 10 - 24% of the time  Expression Expression Mode: Verbal Expression: 4-Expresses basic 75 - 89% of the time/requires cueing 10 - 24% of the time. Needs helper to occlude trach/needs to repeat words.  Social Interaction Social Interaction: 4-Interacts appropriately 75 - 89% of the time - Needs redirection for appropriate language or to initiate interaction.  Problem Solving Problem Solving: 3-Solves basic 50 - 74% of the time/requires cueing 25 - 49% of the time  Memory Memory: 3-Recognizes or recalls 50 - 74% of the time/requires cueing 25 - 49% of the time  Medical Problem List and Plan: 1. 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 Thoracic aortic and supraceliac aortic thrombus/Anticoagulation: Pharmaceutical: Coumadin. To follow up with Dr. Bridgett Larsson in 6 month for repeat CTA chest/abd/pelvis.  3. Pain Management: Tylenol prn 4. Mood:   prozac to help with mood. Team to provide ego support. LCSW to follow for evaluation and support.  5. Neuropsych: This patient is capable of making decisions on his own behalf.  -ritalin trial to help with initiation/processing 6. Skin/Wound Care: Routine pressure relief measures. Maintain adequate nutrition and hydration status  -allevyn dressing for sacrum.  7. Fluids/Electrolytes/Nutrition:     -encourage po fluids as possible  -intake improving  -re-check bmet monday  -continue ritalin to help with initiation (may help with eating too) 8. HTN: Monitor BP every 8 hours. Continue Lopressor bid, and procardia XL. Avoid hypotension.  9. Dyslipidemia: On lovaza and Pravachol.  10 DM type 2: Hgb A1c- 6.9. Was on metformin  500mg  bid at home. May need to resume at  low dose soon  - Monitor BS ac/hs and use SSI for elevated BS.  11. Osteoporosis: Continue Evista. Resume fosamax at discharge.  12. Dysphagia:  .  -D1/thins per SLP 13. Constipation augmented bowel program LOS (Days) 15 A FACE TO FACE EVALUATION WAS PERFORMED  Derek Laughter E 04/17/2015 10:15 AM

## 2015-04-17 NOTE — Progress Notes (Signed)
Physical Therapy Session Note  Patient Details  Name: Laura Melendez MRN: [id number] Date of Birth: 04/18/1941  Today's Date: 04/17/2015 PT Individual Time: 1015-1100 PT Individual Time Calculation (min): 45 min   Short Term Goals: Week 2:  PT Short Term Goal 1 (Week 2): Pt will propel w/c 100' w/ hemi technique PT Short Term Goal 2 (Week 2): Pt will ambulate with MinA w/ LRAD consistently PT Short Term Goal 3 (Week 2): Pt will initiate sit>stand w/ min verbal cueing consistently PT Short Term Goal 4 (Week 2): Pt will complete all bed mobility with MinA and min cueing  Skilled Therapeutic Interventions/Progress Updates:    Pt received supine in bed, agreeable to participate in therapy. Session focused on gait training, postural control. Pt noted with increased lean to R during mobility today compared to last week. Trialed pt with ambulating with ace wrap around ankle to provide DF to reduce toe drag and simulate basic AFO. Pt noted to have decreased toe drag during initial bout of ambulation but continued to require skilled assist to weight shift to L appropriately in order to allow step through gait. During second bout of ambulation therapist backed off on weight shifting and pt demonstrated increased foot drag. Will continue to assess most appropriate ambulation goal for pt at discharge. Instructed pt in seated reaching activity for functional use of RUE while sitting on wedge to facilitate increased anterior pelvic tilt. Pt tolerated session well. Pt's son present for entire session and educated on likely use of w/c for primary mode of locomotion at discharge. Session ended in pt's room, where pt was left seated in w/c w/ son present w/ all needs within reach.    Therapy Documentation Precautions:  Precautions Precautions: Fall Precaution Comments: right hemiparesis, delayed initiation Restrictions Weight Bearing Restrictions: No Pain:  No/denies pain  See FIM for current functional  status  Therapy/Group: Individual Therapy  Rada Hay  Rada Hay, PT, DPT 04/17/2015, 7:45 AM

## 2015-04-18 ENCOUNTER — Inpatient Hospital Stay (HOSPITAL_COMMUNITY): Payer: Medicare HMO

## 2015-04-18 ENCOUNTER — Inpatient Hospital Stay (HOSPITAL_COMMUNITY): Payer: Medicare HMO | Admitting: Speech Pathology

## 2015-04-18 LAB — CBC
HEMATOCRIT: 35.4 % — AB (ref 36.0–46.0)
Hemoglobin: 11.7 g/dL — ABNORMAL LOW (ref 12.0–15.0)
MCH: 26.8 pg (ref 26.0–34.0)
MCHC: 33.1 g/dL (ref 30.0–36.0)
MCV: 81 fL (ref 78.0–100.0)
PLATELETS: 237 10*3/uL (ref 150–400)
RBC: 4.37 MIL/uL (ref 3.87–5.11)
RDW: 14.3 % (ref 11.5–15.5)
WBC: 8.7 10*3/uL (ref 4.0–10.5)

## 2015-04-18 LAB — BASIC METABOLIC PANEL
ANION GAP: 9 (ref 5–15)
BUN: 9 mg/dL (ref 6–20)
CO2: 27 mmol/L (ref 22–32)
CREATININE: 0.57 mg/dL (ref 0.44–1.00)
Calcium: 9.2 mg/dL (ref 8.9–10.3)
Chloride: 103 mmol/L (ref 101–111)
GFR calc Af Amer: >60 mL/min (ref >60)
GFR calc non Af Amer: >60 mL/min (ref >60)
Glucose, Bld: 119 mg/dL — ABNORMAL HIGH (ref 70–99)
Potassium: 4.1 mmol/L (ref 3.5–5.1)
Sodium: 139 mmol/L (ref 135–145)

## 2015-04-18 LAB — GLUCOSE, CAPILLARY
GLUCOSE-CAPILLARY: 136 mg/dL — AB (ref 70–99)
GLUCOSE-CAPILLARY: 95 mg/dL (ref 70–99)
GLUCOSE-CAPILLARY: 120 mg/dL — AB (ref 70–99)
Glucose-Capillary: 114 mg/dL — ABNORMAL HIGH (ref 70–99)

## 2015-04-18 LAB — PROTIME-INR
INR: 2.39 — ABNORMAL HIGH (ref 0.00–1.49)
Prothrombin Time: 26.3 seconds — ABNORMAL HIGH (ref 11.6–15.2)

## 2015-04-18 MED ORDER — METOPROLOL TARTRATE 25 MG PO TABS
25.0000 mg | ORAL_TABLET | Freq: Two times a day (BID) | ORAL | Status: DC
  Administered 2015-04-18 – 2015-04-22 (×7): 25 mg via ORAL
  Filled 2015-04-18 (×10): qty 1

## 2015-04-18 MED ORDER — SENNOSIDES-DOCUSATE SODIUM 8.6-50 MG PO TABS
2.0000 | ORAL_TABLET | Freq: Once | ORAL | Status: AC
  Administered 2015-04-18: 2 via ORAL
  Filled 2015-04-18: qty 2

## 2015-04-18 NOTE — Progress Notes (Signed)
Occupational Therapy Weekly Progress Note  Patient Details  Name: NALINI ALCARAZ MRN: [id number] Date of Birth: [birth year]  Beginning of progress report period: April 11, 2015 End of progress report period: Apr 18, 2015  Today's Date: 04/18/2015 OT Individual Time: 0930-1030 OT Individual Time Calculation (min): 60 min   Patient has met 2 of 5 short term goals.  Pt continues to progress with self-care skills however requires continuous prompts to initiate and progress through sequence.   Husband is attentive to instruction and reports plan to add grab bars and ramp this week.  Patient continues to demonstrate the following deficits: Impaired sitting/standing balance, impaired endurance, impaired RUE function, impaired cognition (initiation and sequencing) and therefore will continue to benefit from skilled OT intervention to enhance overall performance with Reduce care partner burden.  Patient progressing toward long term goals..  Continue plan of care.  OT Short Term Goals Week 2:  OT Short Term Goal 1 (Week 2): Pt will complete lower body dressing using AE, prn, with min assist OT Short Term Goal 1 - Progress (Week 2): Progressing toward goal OT Short Term Goal 2 (Week 2): Pt will demo ability to complete BUE HEP with supervision OT Short Term Goal 2 - Progress (Week 2): Progressing toward goal OT Short Term Goal 3 (Week 2): Pt will complete 1 grooming task using right hand with min assist OT Short Term Goal 3 - Progress (Week 2): Met OT Short Term Goal 4 (Week 2): Pt will bathe 8 of 10 body parts with min verbal cues to attend to affected side OT Short Term Goal 4 - Progress (Week 2): Met OT Short Term Goal 5 (Week 2): Caregiver will demonstrate ability to provide tactile cues during functional transfers in/out of bed. OT Short Term Goal 5 - Progress (Week 2): Progressing toward goal Week 3:  OT Short Term Goal 1 (Week 3): STG=LTG d/t short remaining LOS.  Skilled Therapeutic  Interventions/Progress Updates: ADL-retraining with focus on improved [facility] of right hand during ADL and  family (husband) training on assisted self-care to address deficits with pt's cognitive impairment (delayed initiation and inattention to right), and need for manual contact to reduce pt right posterior-lateral leaning.   Pt completed functional mobility to bathroom with hand-held assist to steady and direct.   Pt transfers to toilet and tub bench with min assist and vc for improved attention to right, transfer technique and sequencing.   Pt bathes with overall min assist to steady as she stands to wash buttocks and peri-area; pt not able to reach to her right foot d/t impaired weight shifting.    Husband observes session and assists pt with mod vc to allow pt to progress on her own after assist with initiation.    Pt returned to chair at sink to dress and to w/c, with overall mod assist to dress RLE and steady as she stood to pull up her pants (pt = 60% of dressing to include tieing left shoe using both hands) and min assist for transfers.   Pt left in w/c with half-lap tray installed and husband remaining in room.        Therapy Documentation Precautions:  Precautions Precautions: Fall Precaution Comments: right hemiparesis, delayed initiation Restrictions Weight Bearing Restrictions: No  Vital Signs: Therapy Vitals Pulse Rate: (!) 59 BP: (!) 129/57 mmHg Patient Position (if appropriate): Sitting Oxygen Therapy SpO2: 99 % O2 Device: Not Delivered   Pain: No/denies pain Pain Assessment Pain Assessment: No/denies pain  See  FIM for current functional status  Therapy/Group: Individual Therapy   [place] 04/18/2015, 2:56 PM

## 2015-04-18 NOTE — Progress Notes (Signed)
[place] PHYSICAL MEDICINE & REHABILITATION     PROGRESS NOTE    Subjective/Complaints: Had an uneventful weekend A  review of systems has been performed and if not noted above is otherwise negative.   Objective: Vital Signs: Blood pressure 160/55, pulse 59, temperature 98.4 F (36.9 C), temperature source Oral, resp. rate 17, weight 46.1 kg (101 lb 10.1 oz), SpO2 94 %. No results found.  Recent Labs  04/18/15 0440  WBC 8.7  HGB 11.7*  HCT 35.4*  PLT 237    Recent Labs  04/18/15 0440  NA 139  K 4.1  CL 103  GLUCOSE 119*  BUN 9  CREATININE 0.57  CALCIUM 9.2   CBG (last 3)   Recent Labs  04/17/15 1623 04/17/15 2105 04/18/15 0639  GLUCAP 160* 107* 114*    Wt Readings from Last 3 Encounters:  04/05/15 46.1 kg (101 lb 10.1 oz)  02/15/15 45.813 kg (101 lb)  01/31/15 44.7 kg (98 lb 8.7 oz)    Physical Exam:  Constitutional: She appears alert, no distress. HENT: mild coating of thrush on the tongue Head: Normocephalic and atraumatic.  Eyes: Conjunctivae are normal. Pupils are equal, round, and reactive to light.  Neck: Normal range of motion. Neck supple.  Cardiovascular: Normal rate and regular rhythm. no murmur Respiratory: Effort normal and breath sounds normal. No respiratory distress. She has no wheezes.  GI: Soft. Bowel sounds are normal.  Musculoskeletal: She exhibits no edema or tenderness.  Neurological: She is alert. RUE:  Language improving--initiating conversation/sentences---better when given a lead in/cue . RUE with spontaneous movement/voluntary effort now---MMT 2- to 2+ RUE. RLE: 2 to 2+ but inconsistent due to apraxia. Underlying tone this am in flexion of leg---broke once leg extended    DTR's 3+. Right central 7 and tongue deviation.   No withdraw to pain RUE and RLE.  M/S: no pain with range of motion right upper extremity Psych: more dynamic Skin: Skin is warm and dry. No breakdown  Assessment/Plan: 1. 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 Physiatrist is providing close team supervision and 24 hour management of active medical problems listed below. Physiatrist and rehab team continue to assess barriers to discharge/monitor patient progress toward functional and medical goals.  FIM: FIM - Bathing Bathing Steps Patient Completed: Right Arm, Chest, Abdomen, Front perineal area, Right upper leg, Right lower leg (including foot) Bathing: 3: Mod-Patient completes 5-7 39f 10 parts or 50-74%  FIM - Upper Body Dressing/Undressing Upper body dressing/undressing steps patient completed: Thread/unthread left bra strap, Thread/unthread left sleeve of pullover shirt/dress Upper body dressing/undressing: 2: Max-Patient completed 25-49% of tasks FIM - Lower Body Dressing/Undressing Lower body dressing/undressing: 1: Total-Patient completed less than 25% of tasks  FIM - Toileting Toileting steps completed by patient: Performs perineal hygiene, Adjust clothing after toileting Toileting Assistive Devices: Grab bar or rail for support Toileting: 3: Mod-Patient completed 2 of 3 steps  FIM - Radio producer Devices: Grab bars Toilet Transfers: 4-To toilet/BSC: Min A (steadying Pt. > 75%), 4-From toilet/BSC: Min A (steadying Pt. > 75%)  FIM - Bed/Chair Transfer Bed/Chair Transfer Assistive Devices: Rubin, Arm rests Bed/Chair Transfer: 3: Supine > Sit: Mod A (lifting assist/Pt. 50-74%/lift 2 legs, 3: Bed > Chair or W/C: Mod A (lift or lower assist), 3: Chair or W/C > Bed: Mod A (lift or lower assist)  FIM - Locomotion:  Wheelchair Distance: 150 Locomotion: Wheelchair: 0: Activity did not occur FIM - Locomotion: Ambulation Locomotion: Ambulation Assistive Devices: Administrator Ambulation/Gait Assistance: 4: Min assist Locomotion: Ambulation:  4: Travels 150 ft or more with minimal assistance (Pt.>75%)  Comprehension Comprehension Mode: Auditory Comprehension: 4-Understands basic 75 - 89% of the time/requires cueing 10 - 24% of the time  Expression Expression Mode: Verbal Expression: 4-Expresses basic 75 - 89% of the time/requires cueing 10 - 24% of the time. Needs helper to occlude trach/needs to repeat words.  Social Interaction Social Interaction: 4-Interacts appropriately 75 - 89% of the time - Needs redirection for appropriate language or to initiate interaction.  Problem Solving Problem Solving: 3-Solves basic 50 - 74% of the time/requires cueing 25 - 49% of the time  Memory Memory: 3-Recognizes or recalls 50 - 74% of the time/requires cueing 25 - 49% of the time  Medical Problem List and Plan: 1. 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 Thoracic aortic and supraceliac aortic thrombus/Anticoagulation: Pharmaceutical: Coumadin. To follow up with Dr. Bridgett Larsson in 6 month for repeat CTA chest/abd/pelvis.  3. Pain Management: Tylenol prn 4. Mood:   prozac to help with mood. Team to provide ego support. LCSW to follow for evaluation and support.  5. Neuropsych: This patient is capable of making decisions on his own behalf.  -ritalin trial to help with initiation/processing 6. Skin/Wound Care: Routine pressure relief measures. Maintain adequate nutrition and hydration status  -allevyn dressing for sacrum.  7. Fluids/Electrolytes/Nutrition:     -encourage po fluids as possible  -intake improving  -bmet ok  -continue ritalin to help with initiation (may help with eating too) 8. HTN: Monitor BP every 8 hours. Continue Lopressor bid, and procardia XL. Avoid hypotension.  9. Dyslipidemia: On lovaza and Pravachol.  10 DM type 2: Hgb A1c- 6.9. Was on metformin  500mg  bid at home. May need to resume at low dose soon  - Monitor BS ac/hs and use SSI for elevated BS.   11. Osteoporosis: Continue Evista. Resume fosamax at discharge.  12. Dysphagia:  .  -D1/thins per SLP 13. Constipation augmented bowel program LOS (Days) 16 A FACE TO FACE EVALUATION WAS PERFORMED  Daina Cara T 04/18/2015 8:21 AM

## 2015-04-18 NOTE — Progress Notes (Signed)
Occupational Therapy Session Note  Patient Details  Name: Laura Melendez MRN: [id number] Date of Birth: [birth year]  Today's Date: 04/18/2015 OT Individual Time: 1400-1500 OT Individual Time Calculation (min): 60 min    Short Term Goals: Week 2:  OT Short Term Goal 1 (Week 2): Pt will complete lower body dressing using AE, prn, with min assist OT Short Term Goal 2 (Week 2): Pt will demo ability to complete BUE HEP with supervision OT Short Term Goal 3 (Week 2): Pt will complete 1 grooming task using right hand with min assist OT Short Term Goal 4 (Week 2): Pt will bathe 8 of 10 body parts with min verbal cues to attend to affected side OT Short Term Goal 5 (Week 2): Caregiver will demonstrate ability to provide tactile cues during functional transfers in/out of bed.  Skilled Therapeutic Interventions/Progress Updates:    Pt seen for OT therapy focusing on ADL retraining, functional transfers, and fine motor coordination. Pt on toilet upon arrival with NT present, OT took over care. Pt stood from toilet with min A to iniaite movement to come into full standing. Pt with strong R lean upon standing to complete hygiene. Pt able to complete hygiene while heavy steadying assist provided. VCs and tactile cues provided to iniaite movement of R LE, however, pt able to iniaite movement of R LE ~50% of time following verbal and tactile cuing. Pt ambulated from bathroom to sink with steadying assist and RW. Pt stood at sink to complete hand hygiene with min steadying assist and increased time. Pt sat in standard chair and tied B shoes with shoe in lap with increased time. Pt able to don and doff L shoe with supervision. She was unable to motor plan ability to doff and don R shoe despite verbal, tactile, and demonstration cues. Pt then set up with various self care items and practiced taking off and putting on caps of small self care items (toothpaste, mouth wash, etc.). Pt required cues to iniate use of R UE in  task, and required assist to place caps back on items following taking it off. Pt left sitting up in w/c at end of session, PT present.    Pt demonstrates ability to motor plan with R LE during functional movement patterns (ambulating forward), however, is unable to iniaite movement independently when moving outside of movement pattern (taking steps backwards, crossing leg over ankle, and lifting LE to place foot in shoe despite multi-modal cuing. Pt's husband present for part of therapy, and educated on ataxia and moving with normal movement patterns.   Therapy Documentation Precautions:  Precautions Precautions: Fall Precaution Comments: right hemiparesis, delayed initiation Restrictions Weight Bearing Restrictions: No Pain: Pain Assessment Pain Assessment: No/denies pain  See FIM for current functional status  Therapy/Group: Individual Therapy  Lewis, Kashaun Bebo C 04/18/2015, 2:44 PM

## 2015-04-18 NOTE — Progress Notes (Signed)
ANTICOAGULATION CONSULT NOTE - Follow Up Consult  Pharmacy Consult for coumadin Indication: aortic clot  No Known Allergies  Patient Measurements: Weight: 101 lb 10.1 oz (46.1 kg)  Vital Signs: Temp: 98.4 F (36.9 C) (05/02 0522) Temp Source: Oral (05/02 0522) BP: 129/57 mmHg (05/02 1144) Pulse Rate: 59 (05/02 1144)  Labs:  Recent Labs  04/18/15 0440  HGB 11.7*  HCT 35.4*  PLT 237  LABPROT 26.3*  INR 2.39*  CREATININE 0.57    Estimated Creatinine Clearance: 44.9 mL/min (by C-G formula based on Cr of 0.57).   Medications:  Scheduled:  . calcium-vitamin D  1 tablet Oral BID WC  . feeding supplement (GLUCERNA SHAKE)  237 mL Oral TID BM  . FLUoxetine  10 mg Oral Daily  . insulin aspart  0-9 Units Subcutaneous TID WC  . lisinopril  2.5 mg Oral Daily  . methylphenidate  5 mg Oral BID WC  . metoprolol  25 mg Oral BID  . multivitamin with minerals  1 tablet Oral Daily  . NIFEdipine  60 mg Oral Daily  . pravastatin  40 mg Oral q1800  . raloxifene  60 mg Oral Daily  . senna-docusate  2 tablet Oral BID  . senna-docusate  2 tablet Oral Once  . sodium phosphate  1 enema Rectal Once  . warfarin  2 mg Oral q1800  . 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  INR today is 2.39. Hgb 11.7, plt wnl   Goal of Therapy:  INR 2-3 Monitor platelets by anticoagulation protocol: Yes   Plan:  - Coumadin 2 mg po daily - Continue INR MWF - CBC Monday AM  Maryanna Shape, PharmD, BCPS  Clinical Pharmacist  Pager: [phone]   04/18/2015,1:04 PM

## 2015-04-18 NOTE — Progress Notes (Signed)
Patient had a syncopal episode earlier this am while in the gym.  She indicated that she was dizzy but denied CP or SOB. Labs today negative for electrolyte abnormality and renal status stable.  EKG done showing sinus bradycardia. Metoprolol decreased to 25 mg bid.

## 2015-04-18 NOTE — Progress Notes (Signed)
Physical Therapy Session Note  Patient Details  Name: Laura Melendez MRN: [id number] Date of Birth: 03-Apr-1941  Today's Date: 04/18/2015 PT Individual Time: 1100-1145 PT Individual Time Calculation (min): 45 min   Short Term Goals: Week 2:  PT Short Term Goal 1 (Week 2): Pt will propel w/c 100' w/ hemi technique PT Short Term Goal 2 (Week 2): Pt will ambulate with MinA w/ LRAD consistently PT Short Term Goal 3 (Week 2): Pt will initiate sit>stand w/ min verbal cueing consistently PT Short Term Goal 4 (Week 2): Pt will complete all bed mobility with MinA and min cueing  Skilled Therapeutic Interventions/Progress Updates:    Pt received seated in w/c, agreeable to participate in therapy. Session focused on gait training, standing balance, weight shifting. Instructed pt in ambulating 200 w/ RW, MinA for weight shifting to L during stance phase to allow for step through and reduce foot drag. Pt required intermittent cueing for upright posture and to correct RW alignment due to tendency for RW to drift to L. Instructed pt in multiple sit<>stands w/ overall CGA-MinA. Instructed pt in weight shifting activity in standing w/ reaching to L w/ LUE. During standing activity pt began significantly leaning to R, which continued in sitting. Pt complained of significant fatigue and became unresponsive. Immediately notified RN and PA who arrived to assess patient. Pt came to after approximately 20-30 seconds. Pt transported back to room via [place] and transferred back to bed to allow nursing to perform EKG. Pt missed last 15 minutes of scheduled PT time.   Therapy Documentation Precautions:  Precautions Precautions: Fall Precaution Comments: right hemiparesis, delayed initiation Restrictions Weight Bearing Restrictions: No Pain:  No/denies pain  See FIM for current functional status  Therapy/Group: Individual Therapy  Rada Hay  Rada Hay, PT, DPT 04/18/2015, 7:36 AM

## 2015-04-18 NOTE — Progress Notes (Signed)
Speech Language Pathology Weekly Progress and Session Note  Patient Details  Name: Laura Melendez MRN: [id number] Date of Birth: 1941/04/16  Beginning of progress report period: April 11, 2015 End of progress report period: Apr 18, 2015  Today's Date: 04/18/2015 SLP Individual Time: 0800-0900 SLP Individual Time Calculation (min): 60 min  Short Term Goals: Week 2: SLP Short Term Goal 1 (Week 2): Patient will consume least restrictive diet without overt s/s of aspiration and while demonstrating a timely swallow initiation given Mod A verbal and visual in 75% of opportunities. SLP Short Term Goal 1 - Progress (Week 2): Met SLP Short Term Goal 2 (Week 2): Patient will verbally express wants and needs in functional communication opportunities given Mod A verbal cues in 75% of opportunities. SLP Short Term Goal 2 - Progress (Week 2): Met SLP Short Term Goal 3 (Week 2): Patient will demonstrate basic problem-solving with functional tasks given Max A verbal and visual cues in 75% of opportunities. SLP Short Term Goal 3 - Progress (Week 2): Met SLP Short Term Goal 4 (Week 2): Patient will demonstrate selective attention in a mildly distracting environment for ~5 minutes given Max A verbal and visual cues. SLP Short Term Goal 4 - Progress (Week 2): Met SLP Short Term Goal 5 (Week 2): Patient will identify 2 new cognitive deficits and 2 new physical deficits given Mod A question cues. SLP Short Term Goal 5 - Progress (Week 2): Met SLP Short Term Goal 6 (Week 2): Patient will demonstrate initiation with functional tasks given Mod A verbal, visual, and tactile cues in 75% of opportunities. SLP Short Term Goal 6 - Progress (Week 2): Met    New Short Term Goals: Week 3: SLP Short Term Goal 1 (Week 3): Patient will consume least restrictive diet without overt s/s of aspiration while demonstrating a timely swallow initiation given Min A verbal and visual cues in 75% of opportunities. SLP Short Term Goal 2  (Week 3): Patient will demonstrate efficient mastication of Dys. 3 textures demonstrated by minimal oral residue with Min A verbal cues.  SLP Short Term Goal 3 (Week 3): Patient will verbally express wants and needs in functional communication opportunities given supervision A verbal cues in 75% of opportunities. SLP Short Term Goal 4 (Week 3): Patient will demonstrate selective attention in a mildly distracting environment for ~45 minutes given Mod A verbal and visual cues. SLP Short Term Goal 5 (Week 3): Patient will demonstrate initiation with functional tasks given Min A verbal and visual cues in 75% of opportunities. SLP Short Term Goal 6 (Week 3): Patient will demonstrate functional problem solving for basic and familiar tasks with Min A multimodal cues in 75% of opportunities.   Weekly Progress Updates: Patient has made functional gains and has met 6 of 6 STG's this reporting period due to increased attention, initiation of tasks and verbal expression, intellectual awareness and swallowing function. Currently, patient is consuming Dys. 2 textures with thin liquids without overt s/s of aspiration and requires Min- Mod A multimodal cues (depending on fatigue) to decrease oral holding and demonstrate a timely swallow initiation.  Patient also requires overall Min-Mod A  multimodal cues for initiation of functional tasks and verbal expression in regards to want needs, selective attention for ~40 minutes in a mildly distracting environment, intellectual awareness of deficits, recall of new information and functional problem solving with functional and familiar tasks. Patient and family education is ongoing. Patient would benefit from continued skilled SLP intervention to maximize her  cognitive and swallowing function in order to maximize her overall functional independence prior to discharge.    Intensity: Minumum of 1-2 x/day, 30 to 90 minutes Frequency: 3 to 5 out of 7 days Duration/Length of Stay:  04/22/15 Treatment/Interventions: Environmental controls;Dysphagia/aspiration precaution training;Cueing hierarchy;Cognitive remediation/compensation;Functional tasks;Internal/external aids;Therapeutic Exercise;Therapeutic Activities;Speech/Language facilitation;Patient/family education   Daily Session  Skilled Therapeutic Interventions: Skilled treatment session focused on dysphagia and cognitive goals.  SLP facilitated session by providing skilled observation with trials of Dys. 3 textures. Patient demonstrated mildly prolonged mastication without oral residue and demonstrated what appeared to be a timely swallow initiation without overt s/s of aspiration, therefore, recommend conservative upgrade to Dys. 2 textures due to patient's fluctuating cognitive function throughout the day. SLP also facilitated session with skilled observation with breakfast meal of Dys. 1 textures with thin liquids. Patient did not demonstrate any oral holding and utilized small bites/sips with Mod I without overt s/s of aspiration. Patient independently utilized her RUE for self-feeding but required Min A verbal cues for attention to it throughout meal. Patient also demonstrated selective attention to meal in a mildly distracting environment for 45 minutes with supervision verbal cues. Patient recalled events from previous therapy sessions with supervision question cues and her verbal expression appeared WFL at the sentence level. Patient left in wheelchair with all needs within reach and husband present. Continue with current plan of care.   FIM:  Comprehension Comprehension Mode: Auditory Comprehension: 4-Understands basic 75 - 89% of the time/requires cueing 10 - 24% of the time Expression Expression Mode: Verbal Expression: 4-Expresses basic 75 - 89% of the time/requires cueing 10 - 24% of the time. Needs helper to occlude trach/needs to repeat words. Social Interaction Social Interaction: 4-Interacts appropriately 75 -  89% of the time - Needs redirection for appropriate language or to initiate interaction. Problem Solving Problem Solving: 3-Solves basic 50 - 74% of the time/requires cueing 25 - 49% of the time Memory Memory: 3-Recognizes or recalls 50 - 74% of the time/requires cueing 25 - 49% of the time FIM - Eating Eating Activity: 5: Set-up assist for open containers;5: Needs verbal cues/supervision Pain Pain Assessment Pain Assessment: No/denies pain  Therapy/Group: Individual Therapy  Ion Gonnella 04/18/2015, 4:01 PM

## 2015-04-19 ENCOUNTER — Inpatient Hospital Stay (HOSPITAL_COMMUNITY): Payer: Medicare HMO

## 2015-04-19 ENCOUNTER — Inpatient Hospital Stay (HOSPITAL_COMMUNITY): Payer: Medicare HMO | Admitting: Occupational Therapy

## 2015-04-19 ENCOUNTER — Inpatient Hospital Stay (HOSPITAL_COMMUNITY): Payer: Medicare HMO | Admitting: Speech Pathology

## 2015-04-19 LAB — GLUCOSE, CAPILLARY
GLUCOSE-CAPILLARY: 142 mg/dL — AB (ref 70–99)
Glucose-Capillary: 114 mg/dL — ABNORMAL HIGH (ref 70–99)
Glucose-Capillary: 75 mg/dL (ref 70–99)
Glucose-Capillary: 197 mg/dL — ABNORMAL HIGH (ref 70–99)

## 2015-04-19 NOTE — Progress Notes (Signed)
Physical Therapy Weekly Progress Note  Patient Details  Name: Laura Melendez MRN: [id number] Date of Birth: Aug 22, 1941  Beginning of progress report period: April 11, 2015 End of progress report period: Apr 19, 2015  Today's Date: 04/19/2015 PT Individual Time: 1030-1100 PT Individual Time Calculation (min): 30 min   Session 2 Time: 1300-1400 Time Calculation (min): 60 min  Patient has met 3 of 4 short term goals.  During reporting period pt made improvements with initiation, sustained attention, upright tolerance.   Patient continues to demonstrate the following deficits: standing balance, functional ambulation, initiation, awareness and therefore will continue to benefit from skilled PT intervention to enhance overall performance with activity tolerance, balance, attention and awareness.  Patient progressing toward long term goals..  Continue plan of care.  PT Short Term Goals Week 2:  PT Short Term Goal 1 (Week 2): Pt will propel w/c 100' w/ hemi technique PT Short Term Goal 1 - Progress (Week 2): Progressing toward goal PT Short Term Goal 2 (Week 2): Pt will ambulate with MinA w/ LRAD consistently PT Short Term Goal 2 - Progress (Week 2): Met PT Short Term Goal 3 (Week 2): Pt will initiate sit>stand w/ min verbal cueing consistently PT Short Term Goal 3 - Progress (Week 2): Met PT Short Term Goal 4 (Week 2): Pt will complete all bed mobility with MinA and min cueing PT Short Term Goal 4 - Progress (Week 2): Met Week 3:  PT Short Term Goal 1 (Week 3): =LTG due to ELOS  Skilled Therapeutic Interventions/Progress Updates:    Session 1: Session focused on point to point ambulation, family training. Instructed pt in ambulating 20' w/ R HHA and MinA 2x20', then instructed pt's husband on assisting pt with point to point ambulation with emphasis on slowing down during turns and making sure that her feet were in a good position. Instructed pt in ambulating around cones w/ R HHA and ModA to  simulate negotiating obstacles in home. Session ended in pt's room, where pt was left seated in w/c w/ husband present w/ all needs within reach.   Session 2: Session focused on stand pivot transfers, w/c propulsion, RLE tone management. Instructed pt in w/c propulsion w/ hemi-technique w/ intermittent HOH assist on LUE. Pt primarily used LLE for propulsion at significantly decreased speed. Instructed pt in blocked practice stand pivot transfers w/c<>armchair w/ R HHA and MinA. Emphasis during transfer on two things before she sat down: turn all the way around, and reach back for chair. Pt able to recall steps 50% of the time. Pt w/ increased fatigue during session leading to decreased motor planning and coordination. Pt transferred back to bed. Therapist provided passive progressing to active assist ROM in PNF D1 pattern to RLE due to increased tone noted in RLE compared to past days. Pt left supine in bed w/ husband present and all needs within reach.   Therapy Documentation Precautions:  Precautions Precautions: Fall Precaution Comments: right hemiparesis, delayed initiation Restrictions Weight Bearing Restrictions: No Pain:  No/denies pain  See FIM for current functional status  Therapy/Group: Individual Therapy  Rada Hay  Rada Hay, PT, DPT 04/19/2015, 7:50 AM

## 2015-04-19 NOTE — Progress Notes (Signed)
Occupational Therapy Session Note  Patient Details  Name: Laura Melendez MRN: [id number] Date of Birth: 29-May-1941  Today's Date: 04/19/2015 OT Individual Time: 0800-0900 OT Individual Time Calculation (min): 60 min    Short Term Goals: Week 1:  OT Short Term Goal 1 (Week 1): Pt will feed self with LUE with moderate assist OT Short Term Goal 1 - Progress (Week 1): Met OT Short Term Goal 2 (Week 1): Pt. will sit EOB for 10 minutes with no LOB OT Short Term Goal 2 - Progress (Week 1): Progressing toward goal OT Short Term Goal 3 (Week 1): Pt will bathe UB/LB with mod assist OT Short Term Goal 3 - Progress (Week 1): Met OT Short Term Goal 4 (Week 1): Pt. will dress UB/:LB with mod assist OT Short Term Goal 4 - Progress (Week 1): Progressing toward goal OT Short Term Goal 5 (Week 1): Pt. will dress UB with mod assist OT Short Term Goal 5 - Progress (Week 1): Met Week 2:  OT Short Term Goal 1 (Week 2): Pt will complete lower body dressing using AE, prn, with min assist OT Short Term Goal 1 - Progress (Week 2): Progressing toward goal OT Short Term Goal 2 (Week 2): Pt will demo ability to complete BUE HEP with supervision OT Short Term Goal 2 - Progress (Week 2): Progressing toward goal OT Short Term Goal 3 (Week 2): Pt will complete 1 grooming task using right hand with min assist OT Short Term Goal 3 - Progress (Week 2): Met OT Short Term Goal 4 (Week 2): Pt will bathe 8 of 10 body parts with min verbal cues to attend to affected side OT Short Term Goal 4 - Progress (Week 2): Met OT Short Term Goal 5 (Week 2): Caregiver will demonstrate ability to provide tactile cues during functional transfers in/out of bed. OT Short Term Goal 5 - Progress (Week 2): Progressing toward goal Week 3:  OT Short Term Goal 1 (Week 3): STG=LTG d/t short remaining LOS.  Skilled Therapeutic Interventions/Progress Updates:    Pt seen for BADL training with a focus on initiation, motor planning, and balance with  toileting and dressing and family education with spouse. Pt declined bathing as she showered yesterday. Pt sat to EOB with min A, worked on "warm up" exercises of arm reaches and trunk rotation to prepare for standing. Pt answered questions with delayed time to answer. Ambulated to and from toilet with RW with min A to guide hips. No leaning during ambulation,but did lean to R in standing at toilet. Cued pt to step R foot out and educated spouse and pt on proper foot placement.  Pt needed 2-3 cues for initiation with toileting and 4-5 with dressing, so overall improved attention and initiation. Pt has fair AROM in RUE, but poor R side awareness.  Completed dressing, attempted sock aid, but sock too tight to pull on effectively. Pt resting in arm chair completing breakfast with husband with her.  Therapy Documentation Precautions:  Precautions Precautions: Fall Precaution Comments: right hemiparesis, delayed initiation Restrictions Weight Bearing Restrictions: No    Vital Signs: Therapy Vitals Pulse Rate: 60 BP: (!) 143/58 mmHg Pain: Pain Assessment Pain Assessment: No/denies pain ADL:  See FIM for current functional status  Therapy/Group: Individual Therapy  [place] 04/19/2015, 12:22 PM

## 2015-04-19 NOTE — Progress Notes (Signed)
[place] PHYSICAL MEDICINE & REHABILITATION     PROGRESS NOTE    Subjective/Complaints: "passed out in therapy yesterday". Feeling well this morning. Slept well.  A  review of systems has been performed and if not noted above is otherwise negative.   Objective: Vital Signs: Blood pressure 159/57, pulse 51, temperature 97.3 F (36.3 C), temperature source Oral, resp. rate 17, weight 46.1 kg (101 lb 10.1 oz), SpO2 97 %. No results found.  Recent Labs  04/18/15 0440  WBC 8.7  HGB 11.7*  HCT 35.4*  PLT 237    Recent Labs  04/18/15 0440  NA 139  K 4.1  CL 103  GLUCOSE 119*  BUN 9  CREATININE 0.57  CALCIUM 9.2   CBG (last 3)   Recent Labs  04/18/15 1634 04/18/15 2102 04/19/15 0642  GLUCAP 95 120* 114*    Wt Readings from Last 3 Encounters:  04/05/15 46.1 kg (101 lb 10.1 oz)  02/15/15 45.813 kg (101 lb)  01/31/15 44.7 kg (98 lb 8.7 oz)    Physical Exam:  Constitutional: She appears alert, no distress. HENT: mild coating of thrush on the tongue Head: Normocephalic and atraumatic.  Eyes: Conjunctivae are normal. Pupils are equal, round, and reactive to light.  Neck: Normal range of motion. Neck supple.  Cardiovascular: Normal rate and regular rhythm. no murmur Respiratory: Effort normal and breath sounds normal. No respiratory distress. She has no wheezes.  GI: Soft. Bowel sounds are normal.  Musculoskeletal: She exhibits no edema or tenderness.  Neurological: She is alert. RUE:  Language improving--initiating conversation/sentences---better when given a lead in/cue . RUE with spontaneous movement/voluntary effort now---MMT 2- to 2+ RUE. RLE: 2 to 2+ but inconsistent due to apraxia. Underlying tone this am in flexion of leg---broke once leg extended    DTR's 3+. Right central 7 and tongue deviation.   No withdraw to pain RUE and RLE.  M/S: no pain with range of motion right upper extremity Psych: more dynamic Skin: Skin is warm and dry. No  breakdown  Assessment/Plan: 1. 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 Physiatrist is providing close team supervision and 24 hour management of active medical problems listed below. Physiatrist and rehab team continue to assess barriers to discharge/monitor patient progress toward functional and medical goals.  FIM: FIM - Bathing Bathing Steps Patient Completed: Right Arm, Chest, Abdomen, Front perineal area, Right upper leg, Right lower leg (including foot) Bathing: 3: Mod-Patient completes 5-7 60f 10 parts or 50-74%  FIM - Upper Body Dressing/Undressing Upper body dressing/undressing steps patient completed: Thread/unthread left bra strap, Thread/unthread left sleeve of pullover shirt/dress Upper body dressing/undressing: 2: Max-Patient completed 25-49% of tasks FIM - Lower Body Dressing/Undressing Lower body dressing/undressing: 1: Total-Patient completed less than 25% of tasks  FIM - Toileting Toileting steps completed by patient: Performs perineal hygiene, Adjust clothing after toileting Toileting Assistive Devices: Grab bar or rail for support Toileting: 3: Mod-Patient completed 2 of 3 steps  FIM - Radio producer Devices: Grab bars Toilet Transfers: 4-To toilet/BSC: Min A (steadying Pt. > 75%), 4-From toilet/BSC: Min A (steadying Pt. > 75%)  FIM - Bed/Chair Transfer Bed/Chair Transfer Assistive Devices: [place], Arm rests Bed/Chair Transfer: 3: Chair or W/C > Bed: Mod A (lift or lower assist)  FIM - Locomotion: Wheelchair Distance: 150 Locomotion: Wheelchair: 0: Activity did not occur FIM - Locomotion: Ambulation Locomotion:  Ambulation Assistive Devices: [place] - Rolling Ambulation/Gait Assistance: 4: Min assist Locomotion: Ambulation: 4: Travels 150 ft or more with minimal assistance  (Pt.>75%)  Comprehension Comprehension Mode: Auditory Comprehension: 4-Understands basic 75 - 89% of the time/requires cueing 10 - 24% of the time  Expression Expression Mode: Verbal Expression: 3-Expresses basic 50 - 74% of the time/requires cueing 25 - 50% of the time. Needs to repeat parts of sentences.  Social Interaction Social Interaction: 4-Interacts appropriately 75 - 89% of the time - Needs redirection for appropriate language or to initiate interaction.  Problem Solving Problem Solving: 2-Solves basic 25 - 49% of the time - needs direction more than half the time to initiate, plan or complete simple activities  Memory Memory: 2-Recognizes or recalls 25 - 49% of the time/requires cueing 51 - 75% of the time  Medical Problem List and Plan: 1. 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 Thoracic aortic and supraceliac aortic thrombus/Anticoagulation: Pharmaceutical: Coumadin. To follow up with Dr. Bridgett Larsson in 6 month for repeat CTA chest/abd/pelvis.  3. Pain Management: Tylenol prn 4. Mood:   prozac to help with mood. Team to provide ego support. LCSW to follow for evaluation and support.  5. Neuropsych: This patient is capable of making decisions on his own behalf.  -ritalin trial to help with initiation/processing 6. Skin/Wound Care: Routine pressure relief measures. Maintain adequate nutrition and hydration status  -allevyn dressing for sacrum.  7. Fluids/Electrolytes/Nutrition:     -encourage po fluids as possible  -intake improving  -bmet ok  -continue ritalin to help with initiation (may help with eating too) 8. HTN/CV: Monitor BP every 8 hours. Continue Lopressor bid, and procardia XL.  -syncopal episode yesterday. Labs/EKG only notable for brady. Metoprolol decreased to 25mg  bid  -encourage po fluids.  9. Dyslipidemia: On lovaza and Pravachol.  10 DM type 2: Hgb A1c- 6.9. Was on metformin  500mg  bid at  home. May need to resume at low dose soon  - Monitor BS ac/hs and use SSI for elevated BS.  11. Osteoporosis: Continue Evista. Resume fosamax at discharge.  12. Dysphagia:  .  -D1/thins per SLP 13. Constipation augmented bowel program LOS (Days) 17 A FACE TO FACE EVALUATION WAS PERFORMED  SWARTZ,ZACHARY T 04/19/2015 8:22 AM

## 2015-04-19 NOTE — Progress Notes (Signed)
Speech Language Pathology Daily Session Note  Patient Details  Name: Laura Melendez MRN: [id number] Date of Birth: 1941-11-16  Today's Date: 04/19/2015 SLP Individual Time: 1130-1210 SLP Individual Time Calculation (min): 40 min  Short Term Goals: Week 3: SLP Short Term Goal 1 (Week 3): Patient will consume least restrictive diet without overt s/s of aspiration while demonstrating a timely swallow initiation given Min A verbal and visual cues in 75% of opportunities. SLP Short Term Goal 2 (Week 3): Patient will demonstrate efficient mastication of Dys. 3 textures demonstrated by minimal oral residue with Min A verbal cues.  SLP Short Term Goal 3 (Week 3): Patient will verbally express wants and needs in functional communication opportunities given supervision A verbal cues in 75% of opportunities. SLP Short Term Goal 4 (Week 3): Patient will demonstrate selective attention in a mildly distracting environment for ~45 minutes given Mod A verbal and visual cues. SLP Short Term Goal 5 (Week 3): Patient will demonstrate initiation with functional tasks given Min A verbal and visual cues in 75% of opportunities. SLP Short Term Goal 6 (Week 3): Patient will demonstrate functional problem solving for basic and familiar tasks with Min A multimodal cues in 75% of opportunities.   Skilled Therapeutic Interventions: Skilled treatment session focused on dysphagia and cognitive goals. SLP facilitated session by providing skilled observation with lunch meal of Dys. 2 textures with thin liquids. Patient demonstrated efficient mastication with what appeared to be a timely swallow initiation without overt s/s of aspiration and required supervision verbal cues for use of swallow strategies of small bites. Patient required Mod A verbal and visual cues for functional problem solving with tray set-up and self-feeding and demonstrated selective attention to meal in a mildly distracting environment for 40 minutes with  supervision verbal cues. Patient recalled events from previous therapy sessions with supervision question cues and patient's overall verbal expression appeared WFL at the sentence level. Patient left in wheelchair with all needs within reach and quick release belt in place. Continue with current plan of care.       FIM:  Comprehension Comprehension Mode: Auditory Comprehension: 4-Understands basic 75 - 89% of the time/requires cueing 10 - 24% of the time Expression Expression Mode: Verbal Expression: 3-Expresses basic 50 - 74% of the time/requires cueing 25 - 50% of the time. Needs to repeat parts of sentences. Social Interaction Social Interaction: 4-Interacts appropriately 75 - 89% of the time - Needs redirection for appropriate language or to initiate interaction. Problem Solving Problem Solving: 2-Solves basic 25 - 49% of the time - needs direction more than half the time to initiate, plan or complete simple activities Memory Memory: 3-Recognizes or recalls 50 - 74% of the time/requires cueing 25 - 49% of the time FIM - Eating Eating Activity: 5: Supervision/cues;5: Set-up assist for open containers  Pain Pain Assessment Pain Assessment: No/denies pain  Therapy/Group: Individual Therapy  Elbert Spickler 04/19/2015, 2:11 PM

## 2015-04-20 ENCOUNTER — Inpatient Hospital Stay (HOSPITAL_COMMUNITY): Payer: Medicare HMO | Admitting: Speech Pathology

## 2015-04-20 ENCOUNTER — Inpatient Hospital Stay (HOSPITAL_COMMUNITY): Payer: Medicare HMO | Admitting: Occupational Therapy

## 2015-04-20 ENCOUNTER — Inpatient Hospital Stay (HOSPITAL_COMMUNITY): Payer: Medicare HMO | Admitting: Physical Therapy

## 2015-04-20 ENCOUNTER — Inpatient Hospital Stay (HOSPITAL_COMMUNITY): Payer: Medicare HMO

## 2015-04-20 LAB — GLUCOSE, CAPILLARY
GLUCOSE-CAPILLARY: 132 mg/dL — AB (ref 70–99)
Glucose-Capillary: 144 mg/dL — ABNORMAL HIGH (ref 70–99)
Glucose-Capillary: 96 mg/dL (ref 70–99)
Glucose-Capillary: 112 mg/dL — ABNORMAL HIGH (ref 70–99)

## 2015-04-20 LAB — PROTIME-INR
INR: 2.62 — AB (ref 0.00–1.49)
PROTHROMBIN TIME: 28.2 s — AB (ref 11.6–15.2)

## 2015-04-20 NOTE — Progress Notes (Signed)
Physical Therapy Session Note  Patient Details  Name: Laura Melendez MRN: [id number] Date of Birth: February 09, 1941  Today's Date: 04/20/2015 PT Individual Time: 1530-1600 PT Individual Time Calculation (min): 30 min   Short Term Goals: Week 3:  PT Short Term Goal 1 (Week 3): =LTG due to ELOS  Skilled Therapeutic Interventions/Progress Updates:    Session focused on postural control, stand pivot transfers, w/c propulsion. Instructed pt in blocked practice stand step turn transfers w/c<>mat table w/ R HHA and MinA for safety w/ mod cueing for completing turn and reaching back for surface before sitting. Instructed pt in seated reaching activity and sit<>stands with emphasis on anterior weight shift and wedge under pelvis to promote anterior tilt. Instructed pt in w/c propulsion w/ hemi-technique, initially required [facility] assist on LUE progressing to min verbal cueing for use of LUE 50' in moderately distracting environment. Session ended in pt's room, where pt was left seated in w/c w/ husband and RN present w/ all needs within reach.   Therapy Documentation Precautions:  Precautions Precautions: Fall Precaution Comments: right hemiparesis, delayed initiation Restrictions Weight Bearing Restrictions: No Pain:  No/denies pain  See FIM for current functional status  Therapy/Group: Individual Therapy  Rada Hay  Rada Hay, PT, DPT 04/20/2015, 7:38 AM

## 2015-04-20 NOTE — Progress Notes (Signed)
NUTRITION FOLLOW UP  Pt meets criteria for NON-SEVERE (MODERATE) MALNUTRITION in the context of chronic illness as evidenced by moderate fat and severe muscle mass loss.  DOCUMENTATION CODES Per approved criteria  -Non-severe (moderate) malnutrition in the context of chronic illness   INTERVENTION: Continue Glucerna Shake po TID, each supplement provides 220 kcal and 10 grams of protein.  Provide nourishment snacks (yogurt). Ordered.  Encourage adequate PO intake.  NUTRITION DIAGNOSIS: Inadequate oral intake related to dislike of food as evidenced by meal completion 15-70%; ongoing  Goal: Pt to meet >/= 90% of their estimated nutrition needs; progressing  Monitor:  PO intake, weight trends, labs, I/O's  74 y.o. 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  Meal completion has been 10-75%. Pt reports appetite is improving. Pt has been consuming her Glucerna Shake. Will continue with current orders. Pt was encouraged to eat her food at meals and to drink her supplements.  Labs and medications reviewed.  Height: Ht Readings from Last 1 Encounters:  02/15/15 5\' 1"  (1.549 m)    Weight: Wt Readings from Last 1 Encounters:  04/05/15 101 lb 10.1 oz (46.1 kg)    BMI:  Body mass index is 19.21 kg/(m^2).  Re-Estimated Nutritional Needs: Kcal: 1600-1800 Protein: 70-85 grams Fluid: 1.6 - 1.8 L/day  Skin: Intact  Diet Order: DIET DYS 2 Room service appropriate?: Yes; Fluid consistency:: Thin   Intake/Output Summary (Last 24 hours) at 04/20/15 1058 Last data filed at 04/20/15 0800  Gross per 24 hour  Intake    480 ml  Output      0 ml  Net    480 ml    Last BM: 5/3  Labs:   Recent  Labs Lab 04/18/15 0440  NA 139  K 4.1  CL 103  CO2 27  BUN 9  CREATININE 0.57  CALCIUM 9.2  GLUCOSE 119*    CBG (last 3)   Recent Labs  04/19/15 1652 04/19/15 2054 04/20/15 0654  GLUCAP 75 197* 132*    Scheduled Meds: . calcium-vitamin D  1 tablet Oral BID WC  . feeding supplement (GLUCERNA SHAKE)  237 mL Oral TID BM  . FLUoxetine  10 mg Oral Daily  . insulin aspart  0-9 Units Subcutaneous TID WC  . lisinopril  2.5 mg Oral Daily  . methylphenidate  5 mg Oral BID WC  . metoprolol  25 mg Oral BID  . multivitamin with minerals  1 tablet Oral Daily  . NIFEdipine  60 mg Oral Daily  . pravastatin  40 mg Oral q1800  . raloxifene  60 mg Oral Daily  . senna-docusate  2 tablet Oral BID  . sodium phosphate  1 enema Rectal Once  . warfarin  2 mg Oral q1800  . Warfarin - Pharmacist Dosing Inpatient   Does not apply q1800    Continuous Infusions:   Past Medical History  Diagnosis Date  . Essential hypertension, benign   . Type 2 diabetes mellitus   . Osteoporosis 06/2008  . Stroke     Past Surgical History  Procedure Laterality Date  . Ankle surgery Left 2004    otif  . Abdominal hysterectomy    . Anterior and posterior repair N/A 03/11/2013  Procedure: ANTERIOR (CYSTOCELE) AND POSTERIOR REPAIR (RECTOCELE);  Surgeon: Linda Hedges, DO;  Location: [place] [facility];  Service: Gynecology;  Laterality: N/A;  with sacrospinous ligament fixation bilateral  . Colonoscopy  2009    negative  . Cataract extraction w/phaco Right 12/27/2014    Procedure: CATARACT EXTRACTION PHACO AND INTRAOCULAR LENS PLACEMENT RIGHT EYE;  Surgeon: Tonny Branch, MD;  Location: [facility];  Service: Ophthalmology;  Laterality: Right;  CDE:10.63  . Cataract extraction w/phaco Left 01/13/2015    Procedure: CATARACT EXTRACTION PHACO AND INTRAOCULAR LENS PLACEMENT LEFT EYE;  Surgeon: Tonny Branch, MD;  Location: [facility];  Service: Ophthalmology;  Laterality: Left;  CDE:18.87  . Cholecystectomy    . Tee without  cardioversion N/A 04/01/2015    Procedure: TRANSESOPHAGEAL ECHOCARDIOGRAM (TEE);  Surgeon: Sanda Klein, MD;  Location: [facility];  Service: Cardiovascular;  Laterality: N/A;    Kallie Locks, MS, RD, LDN Pager # [phone] After hours/ weekend pager # [phone]

## 2015-04-20 NOTE — Progress Notes (Signed)
Provided husband in regards to bowel and bladder management. Discussed time toileting with teach back giving from patients husband. Educate patient and husband on diet restrictions with husband needing further education.

## 2015-04-20 NOTE — Progress Notes (Signed)
Speech Language Pathology Daily Session Note  Patient Details  Name: Laura Melendez MRN: [id number] Date of Birth: [birth year]  Today's Date: 04/20/2015 SLP Individual Time: 1405-1505 SLP Individual Time Calculation (min): 60 min  Short Term Goals: Week 3: SLP Short Term Goal 1 (Week 3): Patient will consume least restrictive diet without overt s/s of aspiration while demonstrating a timely swallow initiation given Min A verbal and visual cues in 75% of opportunities. SLP Short Term Goal 2 (Week 3): Patient will demonstrate efficient mastication of Dys. 3 textures demonstrated by minimal oral residue with Min A verbal cues.  SLP Short Term Goal 3 (Week 3): Patient will verbally express wants and needs in functional communication opportunities given supervision A verbal cues in 75% of opportunities. SLP Short Term Goal 4 (Week 3): Patient will demonstrate selective attention in a mildly distracting environment for ~45 minutes given Mod A verbal and visual cues. SLP Short Term Goal 5 (Week 3): Patient will demonstrate initiation with functional tasks given Min A verbal and visual cues in 75% of opportunities. SLP Short Term Goal 6 (Week 3): Patient will demonstrate functional problem solving for basic and familiar tasks with Min A multimodal cues in 75% of opportunities.   Skilled Therapeutic Interventions: Skilled treatment session focused on dysphagia and cognitive goals. SLP facilitated session by transferring patient to the wheelchair to optimize alertness for PO trials. Patient consumed trials of Dys. 3 textures with mildly prolonged but efficient mastication with minimal oral residue that patient independently cleared with a liquid wash.  Recommend to continue current diet at this time. SLP also facilitated session by providing Mod A question cues for patient to verbally engage in functional conversation about mildly complex, biographical information. Patient also required extra time and Mod A  verbal and question cues for functional problem solving during basic scanning and mathematical task.  Patient left in wheelchair with quick release belt in place and all needs within reach.  Continue with current plan of care.    FIM:  Comprehension Comprehension Mode: Auditory Comprehension: 4-Understands basic 75 - 89% of the time/requires cueing 10 - 24% of the time Expression Expression Mode: Verbal Expression: 3-Expresses basic 50 - 74% of the time/requires cueing 25 - 50% of the time. Needs to repeat parts of sentences. Social Interaction Social Interaction: 4-Interacts appropriately 75 - 89% of the time - Needs redirection for appropriate language or to initiate interaction. Problem Solving Problem Solving: 3-Solves basic 50 - 74% of the time/requires cueing 25 - 49% of the time Memory Memory: 3-Recognizes or recalls 50 - 74% of the time/requires cueing 25 - 49% of the time FIM - Eating Eating Activity: 5: Supervision/cues;5: Set-up assist for open containers  Pain Pain Assessment Pain Assessment: No/denies pain  Therapy/Group: Individual Therapy  [name] 04/20/2015, 4:45 PM

## 2015-04-20 NOTE — Progress Notes (Signed)
Occupational Therapy Session Note  Patient Details  Name: Laura Melendez MRN: [id number] Date of Birth: 01/24/1941  Today's Date: 04/20/2015 OT Individual Time: 0800-0900 OT Individual Time Calculation (min): 60 min    Short Term Goals: Week 1:  OT Short Term Goal 1 (Week 1): Pt will feed self with LUE with moderate assist OT Short Term Goal 1 - Progress (Week 1): Met OT Short Term Goal 2 (Week 1): Pt. will sit EOB for 10 minutes with no LOB OT Short Term Goal 2 - Progress (Week 1): Progressing toward goal OT Short Term Goal 3 (Week 1): Pt will bathe UB/LB with mod assist OT Short Term Goal 3 - Progress (Week 1): Met OT Short Term Goal 4 (Week 1): Pt. will dress UB/:LB with mod assist OT Short Term Goal 4 - Progress (Week 1): Progressing toward goal OT Short Term Goal 5 (Week 1): Pt. will dress UB with mod assist OT Short Term Goal 5 - Progress (Week 1): Met Week 2:  OT Short Term Goal 1 (Week 2): Pt will complete lower body dressing using AE, prn, with min assist OT Short Term Goal 1 - Progress (Week 2): Progressing toward goal OT Short Term Goal 2 (Week 2): Pt will demo ability to complete BUE HEP with supervision OT Short Term Goal 2 - Progress (Week 2): Progressing toward goal OT Short Term Goal 3 (Week 2): Pt will complete 1 grooming task using right hand with min assist OT Short Term Goal 3 - Progress (Week 2): Met OT Short Term Goal 4 (Week 2): Pt will bathe 8 of 10 body parts with min verbal cues to attend to affected side OT Short Term Goal 4 - Progress (Week 2): Met OT Short Term Goal 5 (Week 2): Caregiver will demonstrate ability to provide tactile cues during functional transfers in/out of bed. OT Short Term Goal 5 - Progress (Week 2): Progressing toward goal Week 3:  OT Short Term Goal 1 (Week 3): STG=LTG d/t short remaining LOS.  Skilled Therapeutic Interventions/Progress Updates:      Pt seen for BADL retraining of toileting, bathing, and dressing with a focus on  initiation, attention to R side, memory of adaptive strategies for dressing.  Pt's husband in room to observe therapy and participated in family education on facilitation techniques for self care. Pt had just completed breakfast. Improved attention and initiation of R hand as pt held her coffee cup with R hand and attempted to doff/ don socks with R hand.  Pt ambulated to toilet then shower with RW with min A. Needed cues for R foot placement with all standing activities to facilitate standing balance and control. In shower, actively used R hand to wash L arm, abdomen, and tops of thighs grasping wash cloth.  Pt explained that at home, she prefastens bra and steps into bra. Pt was able to do this with min A to pull over hips as she had on bulky briefs. C/O L shoulder pain with donning shirt.  Tried the opposite approach and pt was able to don shirt with min A: L arm, head, R arm as she has improved R shoulder AROM.  Donned R pant leg by crossing leg over L with assist to guide leg into crossing. Time did not allow for practice with the reacher or sock aid.  Increased time spent focusing on safe sit to stands with hand and body positioning. Pt's husband stated he is very pleased with her progress.  PT arrived for  the next session.  Therapy Documentation  Vital Signs: Therapy Vitals Temp: 97.9 F (36.6 C) Temp Source: Oral Pulse Rate: (!) 53 Resp: 16 BP: (!) 135/50 mmHg Patient Position (if appropriate): Lying Oxygen Therapy SpO2: 94 % O2 Device: Not Delivered Pain: Pain Assessment Pain Assessment: No/denies pain ADL:  See FIM for current functional status  Therapy/Group: Individual Therapy  [place] 04/20/2015, 9:36 AM

## 2015-04-20 NOTE — Progress Notes (Signed)
[place] PHYSICAL MEDICINE & REHABILITATION     PROGRESS NOTE    Subjective/Complaints: Had a good day. Denies any pain. No dizziness, new weakness yesterday A  review of systems has been performed and if not noted above is otherwise negative.   Objective: Vital Signs: Blood pressure 135/50, pulse 53, temperature 97.9 F (36.6 C), temperature source Oral, resp. rate 16, weight 46.1 kg (101 lb 10.1 oz), SpO2 94 %. No results found.  Recent Labs  04/18/15 0440  WBC 8.7  HGB 11.7*  HCT 35.4*  PLT 237    Recent Labs  04/18/15 0440  NA 139  K 4.1  CL 103  GLUCOSE 119*  BUN 9  CREATININE 0.57  CALCIUM 9.2   CBG (last 3)   Recent Labs  04/19/15 1652 04/19/15 2054 04/20/15 0654  GLUCAP 75 197* 132*    Wt Readings from Last 3 Encounters:  04/05/15 46.1 kg (101 lb 10.1 oz)  02/15/15 45.813 kg (101 lb)  01/31/15 44.7 kg (98 lb 8.7 oz)    Physical Exam:  Constitutional: She appears alert, no distress. HENT: mild coating of thrush on the tongue Head: Normocephalic and atraumatic.  Eyes: Conjunctivae are normal. Pupils are equal, round, and reactive to light.  Neck: Normal range of motion. Neck supple.  Cardiovascular: Normal rate and regular rhythm. no murmur Respiratory: Effort normal and breath sounds normal. No respiratory distress. She has no wheezes.  GI: Soft. Bowel sounds are normal.  Musculoskeletal: She exhibits no edema or tenderness.  Neurological: She is alert. RUE:  Language improving--initiating conversation/sentences---better when given a lead in/cue . RUE with spontaneous movement/voluntary effort now---MMT 2- to 2+ RUE. RLE: 2 to 2+ but inconsistent due to apraxia. Underlying tone this am in flexion of leg---broke once leg extended    DTR's 3+. Right central 7 and tongue deviation.   No withdraw to pain RUE and RLE.  M/S: no pain with range of motion right upper extremity Psych: more dynamic Skin: Skin is warm and dry. No  breakdown  Assessment/Plan: 1. 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 Physiatrist is providing close team supervision and 24 hour management of active medical problems listed below. Physiatrist and rehab team continue to assess barriers to discharge/monitor patient progress toward functional and medical goals.  FIM: FIM - Bathing Bathing Steps Patient Completed: Right Arm, Chest, Abdomen, Front perineal area, Right upper leg, Right lower leg (including foot) Bathing: 3: Mod-Patient completes 5-7 41f 10 parts or 50-74%  FIM - Upper Body Dressing/Undressing Upper body dressing/undressing steps patient completed: Thread/unthread left bra strap, Thread/unthread left sleeve of pullover shirt/dress, Thread/unthread right bra strap, Thread/unthread right sleeve of pullover shirt/dresss, Pull shirt over trunk Upper body dressing/undressing: 3: Mod-Patient completed 50-74% of tasks FIM - Lower Body Dressing/Undressing Lower body dressing/undressing steps patient completed: Pull pants up/down Lower body dressing/undressing: 1: Total-Patient completed less than 25% of tasks (therapist assisted due to time constraints)  FIM - Toileting Toileting steps completed by patient: Performs perineal hygiene (no clothing on at time) Geologist, engineering Devices: Grab bar or rail for support Toileting: 2: Max-Patient completed 1 of 3 steps  FIM - Radio producer Devices: Environmental consultant, Grab bars, Elevated toilet seat Toilet Transfers: 4-To toilet/BSC: Min A (steadying Pt. > 75%), 4-From toilet/BSC: Min A (steadying Pt. > 75%)  FIM - Control and instrumentation engineer Devices: Adult nurse  Transfer: 4: Supine > Sit: Min A (steadying Pt. > 75%/lift 1 leg), 4: Bed > Chair or W/C: Min A (steadying Pt. > 75%)  FIM -  Locomotion: Wheelchair Distance: 150 Locomotion: Wheelchair: 0: Activity did not occur FIM - Locomotion: Ambulation Locomotion: Ambulation Assistive Devices: Administrator Ambulation/Gait Assistance: 4: Min assist Locomotion: Ambulation: 4: Travels 150 ft or more with minimal assistance (Pt.>75%)  Comprehension Comprehension Mode: Auditory Comprehension: 4-Understands basic 75 - 89% of the time/requires cueing 10 - 24% of the time  Expression Expression Mode: Verbal Expression: 3-Expresses basic 50 - 74% of the time/requires cueing 25 - 50% of the time. Needs to repeat parts of sentences.  Social Interaction Social Interaction: 4-Interacts appropriately 75 - 89% of the time - Needs redirection for appropriate language or to initiate interaction.  Problem Solving Problem Solving: 2-Solves basic 25 - 49% of the time - needs direction more than half the time to initiate, plan or complete simple activities  Memory Memory: 3-Recognizes or recalls 50 - 74% of the time/requires cueing 25 - 49% of the time  Medical Problem List and Plan: 1. 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 Thoracic aortic and supraceliac aortic thrombus/Anticoagulation: Pharmaceutical: Coumadin. To follow up with Dr. Bridgett Larsson in 6 month for repeat CTA chest/abd/pelvis.  3. Pain Management: Tylenol prn 4. Mood:   prozac to help with mood. Team to provide ego support. LCSW to follow for evaluation and support.  5. Neuropsych: This patient is capable of making decisions on his own behalf.  -ritalin trial to help with initiation/processing 6. Skin/Wound Care: Routine pressure relief measures. Maintain adequate nutrition and hydration status  -allevyn dressing for sacrum.  7. Fluids/Electrolytes/Nutrition:     -encourage po fluids as possible  -intake improving  -bmet ok  -continue ritalin to help with initiation of eating 8. HTN/CV: Monitor BP every 8  hours. Continue Lopressor bid, and procardia XL.  -syncopal episode on 5/2. Labs/EKG only notable for brady. Metoprolol decreased to 25mg  bid  -encourage po fluids.   -no problems yesterday 9. Dyslipidemia: On lovaza and Pravachol.  10 DM type 2: Hgb A1c- 6.9. Was on metformin  500mg  bid at home. May need to resume at low dose soon  - Monitor BS ac/hs and use SSI for elevated BS.  11. Osteoporosis: Continue Evista. Resume fosamax at discharge.  12. Dysphagia:  .  -D2/thins per SLP---upgrade to D3 soon? 13. Constipation augmented bowel program with results  LOS (Days) 18 A FACE TO FACE EVALUATION WAS PERFORMED  Laura Melendez T 04/20/2015 7:45 AM

## 2015-04-20 NOTE — Progress Notes (Signed)
ANTICOAGULATION CONSULT NOTE - Follow Up Consult  Pharmacy Consult for coumadin Indication: aortic clot  No Known Allergies  Patient Measurements: Weight: 101 lb 10.1 oz (46.1 kg)  Vital Signs: Temp: 97.9 F (36.6 C) (05/04 0555) Temp Source: Oral (05/04 0555) BP: 135/50 mmHg (05/04 0555) Pulse Rate: 53 (05/04 0555)  Labs:  Recent Labs  04/18/15 0440 04/20/15 0507  HGB 11.7*  --   HCT 35.4*  --   PLT 237  --   LABPROT 26.3* 28.2*  INR 2.39* 2.62*  CREATININE 0.57  --     Estimated Creatinine Clearance: 44.9 mL/min (by C-G formula based on Cr of 0.57).   Medications:  Scheduled:  . calcium-vitamin D  1 tablet Oral BID WC  . feeding supplement (GLUCERNA SHAKE)  237 mL Oral TID BM  . FLUoxetine  10 mg Oral Daily  . insulin aspart  0-9 Units Subcutaneous TID WC  . lisinopril  2.5 mg Oral Daily  . methylphenidate  5 mg Oral BID WC  . metoprolol  25 mg Oral BID  . multivitamin with minerals  1 tablet Oral Daily  . NIFEdipine  60 mg Oral Daily  . pravastatin  40 mg Oral q1800  . raloxifene  60 mg Oral Daily  . senna-docusate  2 tablet Oral BID  . sodium phosphate  1 enema Rectal Once  . warfarin  2 mg Oral q1800  . 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  INR today is 2.62, stable and therapeutic on 2mg  daily. No bleeding noted per chart.   Goal of Therapy:  INR 2-3 Monitor platelets by anticoagulation protocol: Yes   Plan:  - Coumadin 2 mg po daily - Continue INR MWF  Maryanna Shape, PharmD, BCPS  Clinical Pharmacist  Pager: [phone]   04/20/2015,9:49 AM

## 2015-04-20 NOTE — Progress Notes (Signed)
Physical Therapy Session Note  Patient Details  Name: Laura Melendez MRN: [id number] Date of Birth: 12/26/1940  Today's Date: 04/20/2015 PT Individual Time: 0900-1000 PT Individual Time Calculation (min): 60 min   Short Term Goals: Week 3:  PT Short Term Goal 1 (Week 3): =LTG due to ELOS  Skilled Therapeutic Interventions/Progress Updates:    Gait Training: PT instructs pt in ambulation with SBA with RW - much improved control of [name] during ambulation - intermittent repetitive LOB forward, but pt able to self correct with mod verbal cues to "slow down" - x 200'. PT instructs husband to always stand on pt's R side and to also cue pt to slow down during functional mobility with RW.  PT instructs pt in ascending/descending 12 stairs with B rail req SBA and mod verbal cues for R UE attention, problem solving, and sequencing, as well as significantly increased time to allow pt to process and problem solve as much as possible on her own. PT instructs husband in assisting pt in ascending/descending 4 standard stairs with hand hold assist, to simulate entering/exiting home at d/c, req min A and verbal cues pt pt & husband re: easiest way to assist pt. Husband demonstrates understanding and agrees to use a strong belt on pt at home to assist with hand placement.   Neuormuscular Reeducation: PT introduces [place] exercises for standing balance/strengthening for home exercise program while holding countertop/table top including: R LAQ 5 seconds x 10 reps, heel raises x 5 reps (difficulty with toe raise portion), and standing L hip abduction x 10 reps.   Pt is progressing with ambulation on level surface and stairs - husband is safely trained on ability to assist pt into home/out of home until ramp is built. Continue per PT POC.     Therapy Documentation Precautions:  Precautions Precautions: Fall Precaution Comments: right hemiparesis, delayed initiation Restrictions Weight Bearing Restrictions:  No Pain: Pain Assessment Pain Assessment: No/denies pain  See FIM for current functional status  Therapy/Group: Individual Therapy  Darlinda Bellows M 04/20/2015, 8:34 AM

## 2015-04-21 ENCOUNTER — Inpatient Hospital Stay (HOSPITAL_COMMUNITY): Payer: Medicare HMO | Admitting: Physical Therapy

## 2015-04-21 ENCOUNTER — Inpatient Hospital Stay (HOSPITAL_COMMUNITY): Payer: Medicare HMO

## 2015-04-21 ENCOUNTER — Inpatient Hospital Stay (HOSPITAL_COMMUNITY): Payer: Medicare HMO | Admitting: Speech Pathology

## 2015-04-21 LAB — GLUCOSE, CAPILLARY
GLUCOSE-CAPILLARY: 144 mg/dL — AB (ref 70–99)
Glucose-Capillary: 118 mg/dL — ABNORMAL HIGH (ref 70–99)
Glucose-Capillary: 91 mg/dL (ref 70–99)
Glucose-Capillary: 267 mg/dL — ABNORMAL HIGH (ref 70–99)

## 2015-04-21 NOTE — Patient Care Conference (Signed)
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 Diabetes 04/13/2015  . 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 Right hemiparesis 04/04/2015  . 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 Aortic thrombus   . Stroke with cerebral ischemia   . CVA (cerebral infarction) 03/30/2015  . 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 Fever   . HCAP (healthcare-associated pneumonia) 01/29/2015  . Sepsis 01/29/2015  . DM (diabetes mellitus) 03/23/2014  . Osteoporosis, unspecified 03/23/2014  . Dyslipidemia 03/23/2014  . Tobacco abuse 03/23/2014  . Syncope 01/01/2014  . Essential hypertension, benign 01/01/2014    Expected Discharge Date: Expected Discharge Date: 04/22/15  Team Members Present: Physician leading conference: Dr. Alger Simons Social Worker Present: Alfonse Alpers, LCSW Nurse Present: Dorien Chihuahua, RN PT Present: Canary Brim, Lorriane Shire, PT OT Present: Other (comment) Napoleon Form, OT) SLP Present: Weston Anna, SLP PPS Coordinator present : Daiva Nakayama, RN, CRRN     Current Status/Progress Goal Weekly Team Focus  Medical   syncopal episode yesterday. emerging tone rue and to lesser extent RLE. eating a little better  improved stamina  stamina, bp, nutritio0n   Bowel/Bladder   Pt incont of  bladder due to uragncy- brief in use. Pt cont. of bowel LBM 5-1  manage bowel and bladder with max assist  initate timed tolieting with pt   Swallow/Nutrition/ Hydration   Dys. 2 textures with thin liquids, Supervision-Min A for use of swallow strategies   Min A for use of swallowing strategies  trials of upgraded textures    ADL's   Min assist bathing, Mod assist dressing, Sup-Min assist with transfers  Min A dynamic standing, B & D: Supervision sitting balance, grooming and self-feeding  Improve RUE function (gross and FMC at right hand), family ed, sitting/standing balance, improved cogntiion (attention and initiation)   Mobility   Min-ModA for bed mobility/transfers, Min for short distance ambulation  overall supervision for bed mobility and w/c propulsion, MinA for short distance ambulation and transfers  initiation, gait training, w/c propulsion   Communication   Min-Mod  A  Min A  initiation of wants/needs    Safety/Cognition/ Behavioral Observations  Mod A  Min A  initiation, attention, functional problem solving, working memory, awareness    Pain   no complaints of pain         Skin   skin CDI          Rehab Goals Patient on target to meet rehab goals: Yes Rehab Goals Revised: none *See Care Plan and progress notes for long and short-term goals.  Barriers to Discharge: fatigue, initiation    Possible Resolutions to Barriers:  continue current plan    Discharge Planning/Teaching Needs:  Pt plans to return to her home with her husband to provide minimal  assistance.  Husband has received family education from the therapists and feels prepared to take pt home and provide the care she needs.   Team Discussion:  Pt is making gradual progress.  Pt had a syncopal episode on 02-16-15, but is doing better today.  Per PT, pt is getting tone back in her right side.  Her endurance is still decreased.  PT feels pt does best walking with hand hold assist and not a Lias.  ST has  upgraded pt's diet to D2 and hopes to have pt at D3 by d/c.  Pt is improving conversationally and ST feels cognition is what is affecting her language.  Pt on track for d/c on 04-22-15.  Revisions to Treatment Plan:  none   Continued Need for Acute Rehabilitation Level of Care: The patient requires daily medical management by a physician with specialized training in physical medicine and rehabilitation for the following conditions: Daily direction of a multidisciplinary physical rehabilitation program to ensure safe treatment while eliciting the highest outcome that is of practical value to the patient.: Yes Daily medical management of patient stability for increased activity during participation in an intensive rehabilitation regime.: Yes Daily analysis of laboratory values and/or radiology reports with any subsequent need for medication adjustment of medical intervention for : Neurological problems;Cardiac problems  Choice Kleinsasser, Silvestre Mesi 04/21/2015, 11:45 PM

## 2015-04-21 NOTE — Progress Notes (Addendum)
Physical Therapy Discharge Summary  Patient Details  Name: Laura Melendez MRN: [id number] Date of Birth: 10-16-1941  Today's Date: 04/21/2015 PT Individual Time: 0900-1000 PT Individual Time Calculation (min): 60 min    Patient has met 6 of 10 long term goals due to improved activity tolerance, improved balance, improved postural control, increased strength, increased range of motion, decreased pain, ability to compensate for deficits, functional use of  right upper extremity and right lower extremity, improved attention, improved awareness and improved coordination.  Patient to discharge at a limited ambulatory and primarily w/c level  Supervision.   Patient's care partner is independent to provide the necessary physical assistance at discharge.  Reasons goals not met: Patient's uncoordination and hemiplegia of R UE/LE limit her ability to self propel a manual w/c with Supervision.   Recommendation:  Patient will benefit from ongoing skilled PT services in home health setting to continue to advance safe functional mobility, address ongoing impairments in balance, R hemiplegia & inattention, all aspects of functional mobility, and minimize fall risk.  Equipment: manual w/c  Reasons for discharge: treatment goals met and discharge from hospital  Patient/family agrees with progress made and goals achieved: Yes  Therapeutic Interventions: Therapeutic Activity: PT instructs pt in stand-step transfer with RW req min A from w/c to/from bed, min A sit to supine for R leg onto bed, SBA to roll L in flat bed without rail req verbal cues for hand placement, and min A for R leg and at trunk for L side lie to sit transfer.  PT instructs pt and husband in car transfer using RW at simulated home car height - pt demonstrates ability to safely help pt with min A and increased time.   Gait Training: Pt's husband demonstrates ability to safely assist pt with ambulation using RW providing up to min A due  to R lean and occasional forward lean. PT instructs pt in ascending/descending 12 stairs with B rails in step-to pattern req repeated verbal cues to get R foot entirely flat on step and to continue progressing R arm along rail, req up to min A for balance.  Husband demonstrates ability to assist pt up/down 4 steps with hand hold assist and simulated grab bar assist where doorframe at home would be req up to min A for balance.   Pt is safe to d/c home with husband assist and HHPT follow up.     PT Discharge Precautions/Restrictions Precautions Precautions: Fall Precaution Comments: R hemiplegia, R inattention, delayed initiation Restrictions Weight Bearing Restrictions: No Pain Pain Assessment Pain Assessment: No/denies pain Vision/Perception     R side inattention Cognition Overall Cognitive Status: Impaired/Different from baseline Arousal/Alertness: Awake/alert Attention: Focused Focused Attention: Appears intact Sustained Attention: Impaired Sustained Attention Impairment: Functional basic;Functional complex Selective Attention: Impaired Selective Attention Impairment: Functional basic;Functional complex Memory: Impaired Memory Impairment: Decreased recall of new information Awareness: Impaired Awareness Impairment: Emergent impairment;Anticipatory impairment Problem Solving: Impaired Problem Solving Impairment: Functional basic;Functional complex Executive Function: Initiating;Sequencing Sequencing: Impaired Sequencing Impairment: Functional basic;Functional complex Initiating: Impaired Initiating Impairment: Functional basic;Functional complex Safety/Judgment: Impaired Sensation Sensation Light Touch: Appears Intact Stereognosis: Not tested Hot/Cold: Not tested Proprioception: Impaired Detail Proprioception Impaired Details: Impaired RLE;Impaired RUE Coordination Gross Motor Movements are Fluid and Coordinated: No Fine Motor Movements are Fluid and Coordinated:  Not tested Coordination and Movement Description: decreased initiation Finger Nose Finger Test: slowed movement with R UE, but accurate endpoint bilaterally Heel Shin Test: slowed partial movement on R LE, but wfl on L Motor  Motor Motor: Hemiplegia;Abnormal tone;Motor apraxia;Abnormal postural alignment and control;Motor impersistence  Mobility Bed Mobility Bed Mobility: Rolling Left;Left Sidelying to Sit;Sit to Supine Rolling Left: 5: Supervision Rolling Left Details: Verbal cues for technique Rolling Left Details (indicate cue type and reason): hand placement/arm use Left Sidelying to Sit: 4: Min assist;HOB flat Left Sidelying to Sit Details: Manual facilitation for placement Left Sidelying to Sit Details (indicate cue type and reason): assist for R leg off bed and slight assist at trunk Sit to Supine: 4: Min assist Sit to Supine - Details: Manual facilitation for placement Sit to Supine - Details (indicate cue type and reason): assist for R leg onto bed Transfers Transfers: Yes Stand Pivot Transfers: 4: Min assist Stand Pivot Transfer Details: Manual facilitation for placement Stand Pivot Transfer Details (indicate cue type and reason): balance/steadying assist Locomotion  Ambulation Ambulation: Yes Ambulation/Gait Assistance: 4: Min assist Ambulation Distance (Feet): 200 Feet Assistive device: Rolling Gonzalez Ambulation/Gait Assistance Details: Manual facilitation for weight shifting Ambulation/Gait Assistance Details: pt leans R and occasionally stumbles forward with LOB Gait Gait: Yes Gait Pattern: Impaired Gait Pattern: Step-to pattern;Lateral trunk lean to right;Decreased hip/knee flexion - right;Decreased step length - right Stairs / Additional Locomotion Stairs: Yes Stairs Assistance: 4: Min assist Stairs Assistance Details: Manual facilitation for placement;Verbal cues for technique;Verbal cues for sequencing Stairs Assistance Details (indicate cue type and reason):  cues to get entire R foot on step and to continue progressing R arm along the rail - up to min A for balance Stair Management Technique: Two rails;Step to pattern Number of Stairs: 12 Height of Stairs: 6  Trunk/Postural Assessment  Cervical Assessment Cervical Assessment: Within Functional Limits Thoracic Assessment Thoracic Assessment: Within Functional Limits Lumbar Assessment Lumbar Assessment: Within Functional Limits Postural Control Postural Control: Deficits on evaluation Righting Reactions: delayed Protective Responses: delayed Postural Limitations: sacral sitting and leans laterally in sit after a prolonged period of time  Balance Balance Balance Assessed: Yes Static Sitting Balance Static Sitting - Balance Support: Right upper extremity supported;Left upper extremity supported;Feet supported Static Sitting - Level of Assistance: 5: Stand by assistance Dynamic Sitting Balance Dynamic Sitting - Balance Support: Bilateral upper extremity supported;Feet supported Dynamic Sitting - Level of Assistance: 5: Stand by assistance Static Standing Balance Static Standing - Balance Support: Bilateral upper extremity supported;During functional activity Static Standing - Level of Assistance: 5: Stand by assistance Dynamic Standing Balance Dynamic Standing - Balance Support: During functional activity;Bilateral upper extremity supported Dynamic Standing - Level of Assistance: 4: Min assist Extremity Assessment  RUE Assessment RUE Assessment: Exceptions to [facility] RUE AROM (degrees) Overall AROM Right Upper Extremity: Deficits RUE Overall AROM Comments: arm flexion limited to 90 degrees due to hypertonia and muscle weakness; elbow/grip wfl RUE Strength RUE Overall Strength: Deficits RUE Overall Strength Comments: arm flexion 2+/5, elbow & grip grossly 4/5 RUE Tone RUE Tone: Hypertonic RUE Tone Comments: throughout R arm - difficulty releasing R grip LUE Assessment LUE Assessment:  Exceptions to WFL LUE AROM (degrees) Overall AROM Left Upper Extremity: Deficits;Due to pain;Due to premorbid status LUE Overall AROM Comments: shoulder elevation limited due to pain from premorbid rotator cuff issues; elbow/wrist/grip wfl LUE Strength LUE Overall Strength: Deficits;Due to pain;Due to premorbid status LUE Overall Strength Comments: shoulder flexion 2+/5, elbow & grip grossly 4+/5 RLE Assessment RLE Assessment: Exceptions to [facility] RLE AROM (degrees) Overall AROM Right Lower Extremity: Deficits;Due to decreased strength RLE Overall AROM Comments: hip flexion limited 50% due to weakness/increased tone; knee extension minimally limited by tone;  ankle wfl RLE Strength RLE Overall Strength: Deficits RLE Overall Strength Comments: hip flexion 2+/5, knee and ankle grossly 4/5 RLE Tone RLE Tone: Hypertonic Hypertonic Details: throughout R LE LLE Assessment LLE Assessment: Within Functional Limits  See FIM for current functional status  Antar Milks M 04/21/2015, 9:17 AM

## 2015-04-21 NOTE — Progress Notes (Addendum)
Occupational Therapy Session Note  Patient Details  Name: Laura Melendez MRN: [id number] Date of Birth: February 24, 1941  Today's Date: 04/21/2015 OT Individual Time: 5929-2446 OT Individual Time Calculation (min): 60 min   Short Term Goals: Week 3:  OT Short Term Goal 1 (Week 3): STG=LTG d/t short remaining LOS.  Skilled Therapeutic Interventions/Progress Updates: ADL-retraining with focus on family education during assisted ADL.   Pt received supine in bed with husband seated near bedside.   OT instructed caregiver to perform assisted self-care with therapist providing moderate vc for sequencing, safety with pt handling, and caregiver positioning to reduce risk for falls.   Pt and husband completed shower level bathing, toileting, and dressing at sink with pt requiring overall mod assist to bathe/dress d/t right L/UE hemiparesis w/hx of L-RTC.   Husband reported receiving a used BSC from his son and plans to purchase another if needed s/p discharge to maintain safety in shower and at toilet during transfers.    At end of session OT completed re-ed on BUE HEP.  Pt left in w/c with husband attending to setup for self-feeding (supervised).  Therapy Documentation Precautions:  Precautions Precautions: Fall Precaution Comments: R hemiplegia, R inattention, delayed initiation Restrictions Weight Bearing Restrictions: No  Pain: Pain Assessment Pain Assessment: No/denies pain   See FIM for current functional status  Therapy/Group: Individual Therapy  [place] 04/21/2015, 9:55 AM

## 2015-04-21 NOTE — Progress Notes (Signed)
[facility] PHYSICAL MEDICINE & REHABILITATION     PROGRESS NOTE    Subjective/Complaints: No complaints. No setbacks yesterday. A  review of systems has been performed and if not noted above is otherwise negative.   Objective: Vital Signs: Blood pressure 134/61, pulse 58, temperature 98.4 F (36.9 C), temperature source Oral, resp. rate 17, weight 45.224 kg (99 lb 11.2 oz), SpO2 94 %. No results found. No results for input(s): WBC, HGB, HCT, PLT in the last 72 hours. No results for input(s): NA, K, CL, GLUCOSE, BUN, CREATININE, CALCIUM in the last 72 hours.  Invalid input(s): CO CBG (last 3)   Recent Labs  04/20/15 1701 04/20/15 2100 04/21/15 0643  GLUCAP 96 112* 118*    Wt Readings from Last 3 Encounters:  04/20/15 45.224 kg (99 lb 11.2 oz)  02/15/15 45.813 kg (101 lb)  01/31/15 44.7 kg (98 lb 8.7 oz)    Physical Exam:  Constitutional: She appears alert, no distress. HENT: mild coating of thrush on the tongue Head: Normocephalic and atraumatic.  Eyes: Conjunctivae are normal. Pupils are equal, round, and reactive to light.  Neck: Normal range of motion. Neck supple.  Cardiovascular: Normal rate and regular rhythm. no murmur Respiratory: Effort normal and breath sounds normal. No respiratory distress. She has no wheezes.  GI: Soft. Bowel sounds are normal.  Musculoskeletal: She exhibits no edema or tenderness.  Neurological: She is alert. RUE:  Language improving--initiating conversation/sentences---better when given a lead in/cue . RUE with spontaneous movement/voluntary effort now---MMT 2- to 2+ RUE. RLE: 2 to 2+ but inconsistent due to apraxia. Underlying tone this am in flexion of leg---broke once leg extended    DTR's 3+. Right central 7 and tongue deviation.   No withdraw to pain RUE and RLE.  M/S: no pain with range of motion right upper extremity Psych: more dynamic Skin: Skin is warm and dry. No breakdown  Assessment/Plan: 1. 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 Physiatrist is providing close team supervision and 24 hour management of active medical problems listed below. Physiatrist and rehab team continue to assess barriers to discharge/monitor patient progress toward functional and medical goals.  FIM: FIM - Bathing Bathing Steps Patient Completed: Right Arm, Chest, Abdomen, Front perineal area, Right upper leg, Left Arm, Buttocks, Left upper leg, Left lower leg (including foot) Bathing: 4: Min-Patient completes 8-9 81f 10 parts or 75+ percent  FIM - Upper Body Dressing/Undressing Upper body dressing/undressing steps patient completed: Thread/unthread right bra strap, Thread/unthread left bra strap, Hook/unhook bra, Thread/unthread left sleeve of pullover shirt/dress, Put head through opening of pull over shirt/dress, Pull shirt over trunk (steps into bra) Upper body dressing/undressing: 4: Min-Patient completed 75 plus % of tasks FIM - Lower Body Dressing/Undressing Lower body dressing/undressing steps patient completed: Thread/unthread left pants leg, Thread/unthread right pants leg Lower body dressing/undressing: 1: Total-Patient completed less than 25% of tasks  FIM - Toileting Toileting steps completed by patient: Performs perineal hygiene Toileting Assistive Devices: Grab bar or rail for support Toileting: 2: Max-Patient completed 1 of 3 steps  FIM - Radio producer Devices: Environmental consultant, Grab bars, Elevated toilet seat Toilet Transfers: 4-To toilet/BSC: Min A (steadying Pt. > 75%), 4-From toilet/BSC: Min A (steadying Pt. > 75%)  FIM - Bed/Chair Transfer Bed/Chair Transfer Assistive Devices: Adult nurse Transfer: 5: Bed > Chair or W/C: Supervision (verbal cues/safety issues), 5: Chair  or W/C > Bed: Supervision (verbal cues/safety  issues)  FIM - Locomotion: Wheelchair Distance: 150 Locomotion: Wheelchair: 0: Activity did not occur FIM - Locomotion: Ambulation Locomotion: Ambulation Assistive Devices: Administrator Ambulation/Gait Assistance: 5: Supervision Locomotion: Ambulation: 5: Travels 150 ft or more with supervision/safety issues  Comprehension Comprehension Mode: Auditory Comprehension: 4-Understands basic 75 - 89% of the time/requires cueing 10 - 24% of the time  Expression Expression Mode: Verbal Expression: 4-Expresses basic 75 - 89% of the time/requires cueing 10 - 24% of the time. Needs helper to occlude trach/needs to repeat words.  Social Interaction Social Interaction: 4-Interacts appropriately 75 - 89% of the time - Needs redirection for appropriate language or to initiate interaction.  Problem Solving Problem Solving: 4-Solves basic 75 - 89% of the time/requires cueing 10 - 24% of the time  Memory Memory: 4-Recognizes or recalls 75 - 89% of the time/requires cueing 10 - 24% of the time  Medical Problem List and Plan: 1. 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 Thoracic aortic and supraceliac aortic thrombus/Anticoagulation: Pharmaceutical: Coumadin. To follow up with Dr. Bridgett Larsson in 6 month for repeat CTA chest/abd/pelvis.  3. Pain Management: Tylenol prn 4. Mood:   prozac to help with mood. Team to provide ego support. LCSW to follow for evaluation and support.  5. Neuropsych: This patient is capable of making decisions on his own behalf.  -ritalin trial to help with initiation/processing 6. Skin/Wound Care: Routine pressure relief measures. Maintain adequate nutrition and hydration status  -allevyn dressing for sacrum.  7. Fluids/Electrolytes/Nutrition:     -encourage po fluids as possible  -intake improving  -bmet ok  -continue ritalin to help with initiation of eating 8. HTN/CV: Monitor BP every 8 hours. Continue  Lopressor bid, and procardia XL.  -syncopal episode on 5/2. Labs/EKG only notable for brady. Metoprolol decreased to 25mg  bid  -encourage po fluids.   -no problems yesterday 9. Dyslipidemia: On lovaza and Pravachol.  10 DM type 2: Hgb A1c- 6.9. Was on metformin  500mg  bid at home. May need to resume at low dose soon  - Monitor BS ac/hs and use SSI for elevated BS.  11. Osteoporosis: Continue Evista. Resume fosamax at discharge.  12. Dysphagia:  .  -D2/thins per SLP---upgrade to D3 before dc 13. Constipation augmented bowel program with results  LOS (Days) 19 A FACE TO FACE EVALUATION WAS PERFORMED  SWARTZ,ZACHARY T 04/21/2015 8:01 AM

## 2015-04-21 NOTE — Plan of Care (Signed)
Problem: RH Wheelchair Mobility Goal: LTG Patient will propel w/c in controlled environment (PT) LTG: Patient will propel wheelchair in controlled environment, # of feet with assist (PT)  Outcome: Not Met (add Reason) Pt's R hemiplegia, coordination deficits, and cognitive deficits limit her ability to complete w/c propulsion with SBA.  Goal: LTG Patient will propel w/c in home environment (PT) LTG: Patient will propel wheelchair in home environment, # of feet with assistance (PT).  Outcome: Not Met (add Reason) Pt's R hemiplegia, coordination deficits, and cognitive deficits limit her ability to complete w/c propulsion with SBA. Goal: LTG Patient will propel w/c in community environment (PT) LTG: Patient will propel wheelchair in community environment, # of feet with assist (PT)  Outcome: Not Met (add Reason) Pt's R hemiplegia, coordination deficits, and cognitive deficits limit her ability to complete w/c propulsion with SBA.     

## 2015-04-21 NOTE — Progress Notes (Signed)
Speech Language Pathology Daily Session Note  Patient Details  Name: Laura Melendez MRN: [id number] Date of Birth: [birth year]  Today's Date: 04/21/2015 SLP Individual Time: 1100-1200 SLP Individual Time Calculation (min): 60 min  Short Term Goals: Week 3: SLP Short Term Goal 1 (Week 3): Patient will consume least restrictive diet without overt s/s of aspiration while demonstrating a timely swallow initiation given Min A verbal and visual cues in 75% of opportunities. SLP Short Term Goal 2 (Week 3): Patient will demonstrate efficient mastication of Dys. 3 textures demonstrated by minimal oral residue with Min A verbal cues.  SLP Short Term Goal 3 (Week 3): Patient will verbally express wants and needs in functional communication opportunities given supervision A verbal cues in 75% of opportunities. SLP Short Term Goal 4 (Week 3): Patient will demonstrate selective attention in a mildly distracting environment for ~45 minutes given Mod A verbal and visual cues. SLP Short Term Goal 5 (Week 3): Patient will demonstrate initiation with functional tasks given Min A verbal and visual cues in 75% of opportunities. SLP Short Term Goal 6 (Week 3): Patient will demonstrate functional problem solving for basic and familiar tasks with Min A multimodal cues in 75% of opportunities.   Skilled Therapeutic Interventions: Skilled treatment session focused on dysphagia goals and family education. SLP facilitated session by providing demonstration and verbal education to the patient and her husband in regards to patient's current cognitive-linguistic function and strategies to utilize at home to increase safety, problem solving, attention and working memory.  Patient's husband also educated on need for patient to have 24 hour supervision and assistance with medications. SLP also facilitated session by providing skilled observation with lunch meal of Dys. 3 textures with thin liquids. Patient demonstrated efficient  mastication without overt s/s of aspiration and required Min-Mod A verbal cues for initiation, attention and problem solving with task. Throughout meal, patient and husband provided education in regards to patient's diet recommendations, appropriate textures, compensatory strategies and medication administration.  Both verbalized understanding of all information and handouts were given to reinforce information. Patient left with husband completing meal. Continue with current plan of care.    FIM:  Comprehension Comprehension Mode: Auditory Comprehension: 4-Understands basic 75 - 89% of the time/requires cueing 10 - 24% of the time Expression Expression Mode: Verbal Expression: 4-Expresses basic 75 - 89% of the time/requires cueing 10 - 24% of the time. Needs helper to occlude trach/needs to repeat words. Social Interaction Social Interaction: 4-Interacts appropriately 75 - 89% of the time - Needs redirection for appropriate language or to initiate interaction. Problem Solving Problem Solving: 4-Solves basic 75 - 89% of the time/requires cueing 10 - 24% of the time Memory Memory: 4-Recognizes or recalls 75 - 89% of the time/requires cueing 10 - 24% of the time FIM - Eating Eating Activity: 5: Supervision/cues  Pain Pain Assessment Pain Assessment: No/denies pain  Therapy/Group: Individual Therapy  Laura Melendez 04/21/2015, 4:47 PM

## 2015-04-21 NOTE — Progress Notes (Signed)
Social Work Patient ID: Laura Melendez, female   DOB: April 03, 1941, 73 y.o.   MRN: [id number]   CSW met with pt and her husband on 04-20-15 to update them on team conference discussion.  Pt is excited that she will be going home at the end of the week.  CSW spoke with pt and husband about DME and follow up care with [facility] for PT/OT/ST/RN.  Husband is trying to decide between tow different wheelchairs and CSW will order whichever one he chooses.  CSW will continue to be available, as needed.

## 2015-04-22 ENCOUNTER — Telehealth: Payer: Self-pay | Admitting: Family Medicine

## 2015-04-22 ENCOUNTER — Ambulatory Visit (HOSPITAL_COMMUNITY): Payer: Medicare HMO | Admitting: Speech Pathology

## 2015-04-22 LAB — GLUCOSE, CAPILLARY: Glucose-Capillary: 138 mg/dL — ABNORMAL HIGH (ref 70–99)

## 2015-04-22 LAB — PROTIME-INR
INR: 2.81 — ABNORMAL HIGH (ref 0.00–1.49)
PROTHROMBIN TIME: 29.8 s — AB (ref 11.6–15.2)

## 2015-04-22 MED ORDER — NIFEDIPINE ER OSMOTIC RELEASE 60 MG PO TB24: 60.0000 mg | ORAL_TABLET | Freq: Every day | ORAL | Status: DC

## 2015-04-22 MED ORDER — LISINOPRIL 2.5 MG PO TABS: 2.5000 mg | ORAL_TABLET | Freq: Every day | ORAL | Status: DC

## 2015-04-22 MED ORDER — METOPROLOL TARTRATE 50 MG PO TABS: 50.0000 mg | ORAL_TABLET | Freq: Two times a day (BID) | ORAL | Status: DC

## 2015-04-22 MED ORDER — WARFARIN SODIUM 2 MG PO TABS: ORAL_TABLET | ORAL | Status: DC

## 2015-04-22 MED ORDER — PRAVASTATIN SODIUM 40 MG PO TABS: 40.0000 mg | ORAL_TABLET | Freq: Every day | ORAL | Status: DC

## 2015-04-22 MED ORDER — METHYLPHENIDATE HCL 5 MG PO TABS: 5.0000 mg | ORAL_TABLET | Freq: Two times a day (BID) | ORAL | Status: DC

## 2015-04-22 MED ORDER — FLUOXETINE HCL 10 MG PO CAPS: 10.0000 mg | ORAL_CAPSULE | Freq: Every day | ORAL | Status: DC

## 2015-04-22 MED ORDER — SENNOSIDES-DOCUSATE SODIUM 8.6-50 MG PO TABS: 2.0000 | ORAL_TABLET | Freq: Two times a day (BID) | ORAL | Status: DC

## 2015-04-22 NOTE — Discharge Instructions (Signed)
Inpatient Rehab Discharge Instructions  Laura Melendez Discharge date and time:  04/22/15  Activities/Precautions/ Functional Status: Activity: activity as tolerated Diet: diabetic diet Wound Care: none needed   Functional status:  ___ No restrictions     ___ Walk up steps independently _X__ 24/7 supervision/assistance   ___ Walk up steps with assistance ___ Intermittent supervision/assistance  ___ Bathe/dress independently ___ Walk with [place]     ___ Bathe/dress with assistance ___ Walk Independently    ___ Shower independently ___ Walk with assistance    ___ Shower with assistance _X__ No alcohol     ___ Return to work/school ________  COMMUNITY REFERRALS UPON DISCHARGE:   Home Health:   PT     OT     ST    RN     Agency:  [facility] Phone:  [phone]  Medical Equipment/Items Ordered:  Tub transfer bench from [place]     Phone:  [phone]   16"x16" lightweight w/c with hard back and contoured cushion from [place]     Phone:  [phone]  GENERAL COMMUNITY RESOURCES FOR PATIENT/FAMILY: Support Groups:  [facility] Stroke Support Group                              Meets every 3rd Sunday of the month (except for June, July, and August) at 3 PM                              In the dayroom at the Inpatient Rehab Unit at [facility], [address]  Special Instructions: 1. Check blood sugars before meals and at bedtime for next few days. If sugars are below 120s consistently decrease to twice a day before meals. 2. Dr. Wolfgang Phoenix to manage and adjust coumadin dose.  3. Do not crush Nifedipine (Procardia XL). Small pills can be administered whole--one at a time and check to make sure she is not pocketing. Crush other pills and administer with pudding or applesauce.    STROKE/TIA DISCHARGE INSTRUCTIONS SMOKING Cigarette smoking nearly doubles your risk of having a stroke & is the single most alterable risk factor  If you smoke or have smoked in  the last 12 months, you are advised to quit smoking for your health.  Most of the excess cardiovascular risk related to smoking disappears within a year of stopping.  Ask you doctor about anti-smoking medications  [place] Quit Line: 1-800-QUIT NOW  Free Smoking Cessation Classes [phone]  CHOLESTEROL Know your levels; limit fat & cholesterol in your diet  Lipid Panel     Component Value Date/Time   CHOL 166 03/31/2015 0625   CHOL 211* 02/21/2015 0810   TRIG 170* 03/31/2015 0625   HDL 41 03/31/2015 0625   HDL 52 02/21/2015 0810   CHOLHDL 4.0 03/31/2015 0625   CHOLHDL 4.1 02/21/2015 0810   VLDL 34 03/31/2015 0625   LDLCALC 91 03/31/2015 0625   LDLCALC 124* 02/21/2015 0810      Many patients benefit from treatment even if their cholesterol is at goal.  Goal: Total Cholesterol (CHOL) less than 160  Goal:  Triglycerides (TRIG) less than 150  Goal:  HDL greater than 40  Goal:  LDL (LDLCALC) less than 100   BLOOD PRESSURE American Stroke Association blood pressure target is less that 120/80 mm/Hg  Your discharge blood pressure is:  BP: Marland Kitchen)  137/56 mmHg  Monitor your blood pressure  Limit your salt and alcohol intake  Many individuals will require more than one medication for high blood pressure  DIABETES (A1c is a blood sugar average for last 3 months) Goal HGBA1c is under 7% (HBGA1c is blood sugar average for last 3 months)  Diabetes:     Lab Results  Component Value Date   HGBA1C 6.9* 03/31/2015     Your HGBA1c can be lowered with medications, healthy diet, and exercise.  Check your blood sugar as directed by your physician  Call your physician if you experience unexplained or low blood sugars.  PHYSICAL ACTIVITY/REHABILITATION Goal is 30 minutes at least 4 days per week  Activity: No driving, Therapies: see above Return to work:  N/A  Activity decreases your risk of heart attack and stroke and makes your heart stronger.  It helps control your weight and blood  pressure; helps you relax and can improve your mood.  Participate in a regular exercise program.  Talk with your doctor about the best form of exercise for you (dancing, walking, swimming, cycling).  DIET/WEIGHT Goal is to maintain a healthy weight  Your discharge diet is: DIET DYS 2  Thin  liquids Your height is:  5'1" Your current weight is: Weight: 45.224 kg (99 lb 11.2 oz) Your Body Mass Index (BMI) is:  18.9  Following the type of diet specifically designed for you will help prevent another stroke.  You are below goal weight.    Your goal Body Mass Index (BMI) is 19-24.  Healthy food habits can help reduce 3 risk factors for stroke:  High cholesterol, hypertension, and excess weight.  RESOURCES Stroke/Support Group:  Call [phone]   STROKE EDUCATION PROVIDED/REVIEWED AND GIVEN TO PATIENT Stroke warning signs and symptoms How to activate emergency medical system (call 911). Medications prescribed at discharge. Need for follow-up after discharge. Personal risk factors for stroke. Pneumonia vaccine given:  Flu vaccine given:  My questions have been answered, the writing is legible, and I understand these instructions.  I will adhere to these goals & educational materials that have been provided to me after my discharge from the hospital.      My questions have been answered and I understand these instructions. I will adhere to these goals and the provided educational materials after my discharge from the hospital.  Patient/Caregiver Signature _______________________________ Date __________  Clinician Signature _______________________________________ Date __________  Please bring this form and your medication list with you to all your follow-up doctor's appointments.

## 2015-04-22 NOTE — Progress Notes (Signed)
Social Work Discharge Note  The overall goal for the admission was met for:   Discharge location: Yes - home  Length of Stay: Yes - 20 days  Discharge activity level: Yes - minimal assistance  Home/community participation: Yes   Services provided included: MD, RD, PT, OT, SLP, RN, Pharmacy and [place]: Private Insurance:  [place] [employer]  Follow-up services arranged: Home Health: RN/PT/OT/ST, DME: 16"x16" lightweight w/c with contoured cushion and hard back; tub transfer bench and Patient/Family has no preference for HH/DME agencies [facility] [place] provided the wheelchair.  Pt's family obtained a bedside commode.  Comments (or additional information):  Pt to d/c to her home with her husband to provide 24/7 care at home.  He has extended family and friends who support them.    Patient/Family verbalized understanding of follow-up arrangements: Yes  Individual responsible for coordination of the follow-up plan: pt's husband  Confirmed correct DME delivered: Trey Sailors 04/22/2015    Haylei Cobin, Silvestre Mesi

## 2015-04-22 NOTE — Progress Notes (Signed)
[facility] PHYSICAL MEDICINE & REHABILITATION     PROGRESS NOTE    Subjective/Complaints: Both patient and husband excited to go home.  A  review of systems has been performed and if not noted above is otherwise negative.   Objective: Vital Signs: Blood pressure 137/56, pulse 66, temperature 97.9 F (36.6 C), temperature source Oral, resp. rate 18, weight 45.224 kg (99 lb 11.2 oz), SpO2 96 %. No results found. No results for input(s): WBC, HGB, HCT, PLT in the last 72 hours. No results for input(s): NA, K, CL, GLUCOSE, BUN, CREATININE, CALCIUM in the last 72 hours.  Invalid input(s): CO CBG (last 3)   Recent Labs  04/21/15 1630 04/21/15 2048 04/22/15 0644  GLUCAP 91 267* 138*    Wt Readings from Last 3 Encounters:  04/20/15 45.224 kg (99 lb 11.2 oz)  02/15/15 45.813 kg (101 lb)  01/31/15 44.7 kg (98 lb 8.7 oz)    Physical Exam:  Constitutional: She appears alert, no distress. HENT: mild coating of thrush on the tongue Head: Normocephalic and atraumatic.  Eyes: Conjunctivae are normal. Pupils are equal, round, and reactive to light.  Neck: Normal range of motion. Neck supple.  Cardiovascular: Normal rate and regular rhythm. no murmur Respiratory: Effort normal and breath sounds normal. No respiratory distress. She has no wheezes.  GI: Soft. Bowel sounds are normal.  Musculoskeletal: She exhibits no edema or tenderness.  Neurological: She is alert. RUE:  Language improving--initiating conversation/sentences---better when given a lead in/cue . RUE with spontaneous movement/voluntary effort now---MMT 2- to 2+ RUE. RLE: 2 to 2+ but inconsistent due to apraxia. Underlying tone this am in flexion of leg---broke once leg extended    DTR's 3+. Right central 7 and tongue deviation.   No withdraw to pain RUE and RLE.  M/S: no pain with range of motion right upper extremity Psych: more dynamic Skin: Skin is warm and dry. No breakdown  Assessment/Plan: 1. 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 Physiatrist is providing close team supervision and 24 hour management of active medical problems listed below. Physiatrist and rehab team continue to assess barriers to discharge/monitor patient progress toward functional and medical goals.  FIM: FIM - Bathing Bathing Steps Patient Completed: Chest, Left Arm, Right Arm, Abdomen, Front perineal area, Buttocks, Right upper leg, Left upper leg, Left lower leg (including foot) Bathing: 4: Min-Patient completes 8-9 5f 10 parts or 75+ percent  FIM - Upper Body Dressing/Undressing Upper body dressing/undressing steps patient completed: Thread/unthread left bra strap, Hook/unhook bra, Thread/unthread right sleeve of pullover shirt/dresss, Thread/unthread left sleeve of pullover shirt/dress Upper body dressing/undressing: 4: Min-Patient completed 75 plus % of tasks FIM - Lower Body Dressing/Undressing Lower body dressing/undressing steps patient completed: Thread/unthread left underwear leg, Don/Doff left sock, Don/Doff right shoe, Don/Doff left shoe, Fasten/unfasten right shoe, Fasten/unfasten left shoe, Thread/unthread left pants leg Lower body dressing/undressing: 3: Mod-Patient completed 50-74% of tasks  FIM - Toileting Toileting steps completed by patient: Adjust clothing prior to toileting, Performs perineal hygiene Toileting Assistive Devices: Grab bar or rail for support Toileting: 3: Mod-Patient completed 2 of 3 steps  FIM - Radio producer Devices: Grab bars Toilet Transfers: 4-To toilet/BSC: Min A (steadying Pt. > 75%), 4-From toilet/BSC: Min A (steadying Pt. > 75%)  FIM - Bed/Chair Transfer Bed/Chair Transfer Assistive Devices: [place], Arm rests Bed/Chair Transfer: 4: Supine > Sit: Min  A (steadying Pt. > 75%/lift 1 leg), 4: Sit >  Supine: Min A (steadying pt. > 75%/lift 1 leg), 4: Bed > Chair or W/C: Min A (steadying Pt. > 75%), 4: Chair or W/C > Bed: Min A (steadying Pt. > 75%)  FIM - Locomotion: Wheelchair Distance: 150 Locomotion: Wheelchair: 1: Total Assistance/staff pushes wheelchair (Pt<25%) FIM - Locomotion: Ambulation Locomotion: Ambulation Assistive Devices: Administrator Ambulation/Gait Assistance: 4: Min assist Locomotion: Ambulation: 4: Travels 150 ft or more with minimal assistance (Pt.>75%)  Comprehension Comprehension Mode: Auditory Comprehension: 4-Understands basic 75 - 89% of the time/requires cueing 10 - 24% of the time  Expression Expression Mode: Verbal Expression: 4-Expresses basic 75 - 89% of the time/requires cueing 10 - 24% of the time. Needs helper to occlude trach/needs to repeat words.  Social Interaction Social Interaction: 4-Interacts appropriately 75 - 89% of the time - Needs redirection for appropriate language or to initiate interaction.  Problem Solving Problem Solving: 4-Solves basic 75 - 89% of the time/requires cueing 10 - 24% of the time  Memory Memory: 4-Recognizes or recalls 75 - 89% of the time/requires cueing 10 - 24% of the time  Medical Problem List and Plan: 1. 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 Thoracic aortic and supraceliac aortic thrombus/Anticoagulation: Pharmaceutical: Coumadin. To follow up with Dr. Bridgett Larsson in 6 month for repeat CTA chest/abd/pelvis.  3. Pain Management: Tylenol prn 4. Mood:   prozac to help with mood. Team to provide ego support. LCSW to follow for evaluation and support.  5. Neuropsych: This patient is capable of making decisions on his own behalf.  -ritalin trial to help with initiation/processing---continue until follow up with me 6. Skin/Wound Care: Routine pressure relief measures. Maintain adequate nutrition and hydration status  -allevyn dressing for sacrum.  7.  Fluids/Electrolytes/Nutrition:     -encourage po fluids as possible  -intake improving  -bmet ok  -continue ritalin to help with initiation of eating 8. HTN/CV: Monitor BP every 8 hours. Continue Lopressor bid, and procardia XL.  - Metoprolol decreased to 25mg  bid---bp and hr controlled  -encourage po fluids.  9. Dyslipidemia: On lovaza and Pravachol.  10 DM type 2: Hgb A1c- 6.9. Was on metformin  500mg  bid at home. May need to resume at low dose soon  - Monitor BS ac/hs and use SSI for elevated BS.  11. Osteoporosis: Continue Evista. Resume fosamax at discharge.  12. Dysphagia:  .  -D3/thins per SLP  13. Constipation augmented bowel program with results  LOS (Days) 20 A FACE TO FACE EVALUATION WAS PERFORMED  Lititia Sen T 04/22/2015 7:53 AM

## 2015-04-22 NOTE — Progress Notes (Signed)
Speech Language Pathology Session Note & Discharge Summary  Patient Details  Name: Laura Melendez MRN: [id number] Date of Birth: December 17, 1941  Today's Date: 04/22/2015 SLP Individual Time: 7829-5621 SLP Individual Time Calculation (min): 25 min   Skilled Therapeutic Interventions: Skilled treatment session focused on dysphagia goals. SLP facilitated session by providing Min A verbal cues and extra time for tray set-up and attention to RUE during self-feeding. Patient consumed breakfast meal of Dys. 3 textures with thin liquids without overt s/s of aspiration and was Mod I for use of swallowing compensatory strategies. Recommend patient continue current diet. Patient and her husband reported no questions at this time and patient will d/c home today. Patient left in wheelchair finishing meal with husband present.   Patient has met 6 of 6 long term goals.  Patient to discharge at overall Min;Supervision level.   Reasons goals not met: N/A   Clinical Impression/Discharge Summary: Patient has made functional gains and has met 6 of 6 LTG's this admission.  Currently, patient is consuming Dys. 3 textures with thin liquids without overt s/s of aspiration and requires supervision verbal cues for use of swallowing strategies and Min A for functional problem solving and attention to RUE during self-feeding.  Patient also requires overall Min A multimodal cues for initiation of functional tasks and verbal expression, selective attention in a mildly distracting environment, functional problem solving for basic and familiar tasks, safety awareness, emergent awareness of errors and recall of new information. Patient/family education complete and patient will discharge home with 24 hour supervision from family. Patient would benefit from f/u home health SLP services to maximize cognitive and swallowing function and overall functional independence to reduce caregiver burden.   Care Partner:  Caregiver Able to Provide  Assistance: Yes  Type of Caregiver Assistance: Physical;Cognitive  Recommendation:  24 hour supervision/assistance;Home Health SLP  Rationale for SLP Follow Up: Maximize cognitive function and independence;Maximize swallowing safety;Reduce caregiver burden;Maximize functional communication   Equipment: N/A   Reasons for discharge: Treatment goals met;Discharged from hospital   Patient/Family Agrees with Progress Made and Goals Achieved: Yes   See FIM for current functional status  [name] 04/22/2015, 8:21 AM

## 2015-04-22 NOTE — Discharge Summary (Signed)
Physician Discharge Summary  Patient ID: Laura Melendez MRN: [id number] DOB/AGE: [birth year] 74 y.o.  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   Labs:  Basic Metabolic Panel:  Recent Labs Lab 04/18/15 0440  NA 139  K 4.1  CL 103  CO2 27  GLUCOSE 119*  BUN 9  CREATININE 0.57  CALCIUM 9.2    CBC:  Recent Labs Lab 04/18/15 0440  WBC 8.7  HGB 11.7*  HCT 35.4*  MCV 81.0  PLT 237    CBG:  Recent Labs Lab 04/21/15 0643 04/21/15 1115 04/21/15 1630 04/21/15 2048 04/22/15 0644  GLUCAP 118* 144* 91 267* 138*     Brief HPI:   Laura Melendez is a 74 y.o. RH-female with history of DM type 2, HTN, DDD lumbar spine, recent febrile illness with negative workup; who was admitted on 03/30/15 with MS changes, incontinence and weakness RLE. 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 MRI brain done revealing subcortical punctate acute infarcts in the high, medial left frontal lobe and MRA without major intracranial occlusion. 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 CTA chest recommended for work up per Dr. Bridgett Larsson and anticoagulation if positive for thrombus. CTA chest revealed eccentric mural thrombus along proximal and descending aorta and patient was cleared to initiate coumadin for likely embolic stroke. TEE done revealing EF 55-60% with grade 1 diastolic dysfunction, severe atheromatous plaque in aortic arch with extensive and deep ulceration but no thrombus. Patient with resultant right hemiparesis, decreased vocal intensity with decreased awareness of deficits and delayed processing. CIR  recommended for follow up therapy.     Hospital Course: Laura Melendez was admitted to rehab 04/02/2015 for inpatient therapies to consist of PT, ST and OT at least three hours five days a week. Past admission physiatrist, therapy team and rehab RN have worked together to provide customized collaborative inpatient rehab. She was maintained on coumadin for secondary stroke prevention and has tolerated this without side effects. INR is therapeutic at discharge and coumadin was adjusted to 2 mg all days except 1 mg on Fridays. H/H has been monitored with serial checks and is stable.  She was noted to have lability with depressed mood and was started on prozac for mood stabilization. Po intake was poor at admission and diet was downgraded to dysphagia 1 due to tendency to hold foods as well as poor initiation. Ritalin was added to help with activation and attention and has been very effective. She is to continue this till follow up in office for re-evaluation.  Po intake has improved and IVF were d/c. Diabetes was monitored with ac/hs check and BS have been reasonable on diet alone.  Blood pressures are controlled and mood has improved with resolution of bouts of lability. She is continent of bowel and bladder. She has made steady progress during her rehab stay and is currently at min assist level. She will continue to receive follow up [place], [place], [place], [place] past discharge.    Rehab course: During patient's stay in rehab weekly team conferences were held to monitor patient's progress, set  goals and discuss barriers to discharge. At admission, patient required max to total assist with mobility and total assist with ADL tasks. She required max cues for 1 and 2 step commands and had severe cognitive deficits with deficits in initiation, attention, problems solving and recall.   She  has had improvement in activity tolerance, balance, postural control, improvement in functional use LUE  and  LLE as well as improvement in awareness. She requires in assist for problem solving, selective attention, initiation of tasks as well as emergent awareness of deficits. She is able to complete bathing and upper body dressing with min assist and requries moderate assistance with lower body dressing. She requires min assist with transfers and ambulation. She is able to ambulate 200' with min assist. Family education was done with husband who will provide assistance needed past discharge.     Disposition:  Home  Diet: Diabetic. Dysphagia 3, chopped meats.   Special Instructions: 1. Check blood sugars before meals and at bedtime for next few days. If sugars are below 120s consistently decrease to twice a day before meals. 2. Next protime draw on 05/09 at office visit.   3. Do not crush Nifedipine (Procardia XL). Small pills can be administered whole--one at a time and check to make sure she is not pocketing. Crush other pills and administer with pudding or applesauce.       Medication List    STOP taking these medications        aspirin 325 MG EC tablet     FISH OIL CONCENTRATE 300 MG Caps     insulin aspart 100 UNIT/ML injection  Commonly known as:  novoLOG     metFORMIN 500 MG tablet  Commonly known as:  GLUCOPHAGE     naproxen sodium 220 MG tablet  Commonly known as:  ANAPROX      TAKE these medications        alendronate 70 MG tablet  Commonly known as:  FOSAMAX  Take 1 tablet (70 mg total) by mouth every 7 (seven) days.     CALCIUM 600/VITAMIN D3 600-800 MG-UNIT Tabs  Generic drug:  Calcium Carb-Cholecalciferol  Take 1 tablet by mouth 2 (two) times daily with a meal.     CINNAMON PO  Take 1,000 mg by mouth daily.     CRANBERRY PO  Take 84 mg by mouth daily.     FLUoxetine 10 MG capsule  Commonly known as:  PROZAC  Take 1 capsule (10 mg total) by mouth daily. For depression/anxiety     lisinopril 2.5 MG tablet  Commonly known as:  PRINIVIL,ZESTRIL  Take 1 tablet  (2.5 mg total) by mouth daily.     methylphenidate 5 MG tablet  Commonly known as:  RITALIN  Take 1 tablet (5 mg total) by mouth 2 (two) times daily with breakfast and lunch. Do not refill till 05/19/15     metoprolol 50 MG tablet  Commonly known as:  LOPRESSOR  Take 1 tablet (50 mg total) by mouth 2 (two) times daily.     multivitamin with minerals Tabs tablet  Take 1 tablet by mouth daily.     NIFEdipine 60 MG 24 hr tablet  Commonly known as:  PROCARDIA XL/ADALAT-CC  Take 1 tablet (60 mg total) by mouth daily.     pravastatin 40 MG tablet  Commonly known as:  PRAVACHOL  Take 1 tablet (40 mg total) by mouth daily.     raloxifene 60 MG tablet  Commonly known as:  EVISTA  Take 1 tablet (60 mg total) by mouth daily.     senna-docusate 8.6-50 MG per tablet  Commonly known as:  Senokot-S  Take 2 tablets by mouth 2 (two) times daily. For constipation     warfarin 2 MG tablet  Commonly known as:  COUMADIN  Take 1/2 a pill on Friday evening and take a whole pill on all other days.       Follow-up Information    Follow up with Meredith Staggers, MD On 06/15/2015.   Specialty:  Physical Medicine and Rehabilitation   Why:  Be there at 11:30   for noon  appointment    Contact information:   [address], [place] [address] [phone]       Follow up with Xu,Jindong, MD. Call today.   Specialty:  Neurology   Why:  for follow up appointment in 4 weeks   Contact information:   [address] [place] [address] [phone]       Follow up with Adele Barthel, MD. Call today.   Specialties:  Vascular Surgery, Cardiology   Why:  for follow up appointment and repeat study.   Contact information:   [address] [place] [address] [phone]       Follow up with [name], MD On 04/25/2015.   Specialty:  Family Medicine   Why:  Be there at 1 pm for post hospital follow up and lab draw.    Contact information:   [address] [place] [address] [phone]       Signed: Bary Leriche 04/22/2015, 8:22 AM

## 2015-04-22 NOTE — Telephone Encounter (Signed)
Pt being D/C'd from [facility].  They wanted to inform us of her INR results  Today - PT = 29.8 and INR = 2.81 Instructed to take Coumadin 2mg  daily except for Friday which is 1mg    They wanted to fax notes to [place], explained to the lady that we are also on Epic and she stated she would forward to PCP

## 2015-04-22 NOTE — Progress Notes (Signed)
Pt discharging via wheelchair, escorted by staff, accompanied by family. All discharge instructions and education has been provided to patient and family, verbalize understanding and able to provide appropriate teach back. All belongings in pt/family's possession.  Denies needs or questions at this time.

## 2015-04-25 ENCOUNTER — Encounter: Payer: Self-pay | Admitting: Family Medicine

## 2015-04-25 ENCOUNTER — Ambulatory Visit (INDEPENDENT_AMBULATORY_CARE_PROVIDER_SITE_OTHER): Payer: Medicare HMO | Admitting: Family Medicine

## 2015-04-25 VITALS — BP 118/68 | Ht 61.0 in | Wt 92.6 lb

## 2015-04-25 DIAGNOSIS — I639 Cerebral infarction, unspecified: Secondary | ICD-10-CM | POA: Diagnosis not present

## 2015-04-25 DIAGNOSIS — Z7901 Long term (current) use of anticoagulants: Secondary | ICD-10-CM

## 2015-04-25 DIAGNOSIS — E119 Type 2 diabetes mellitus without complications: Secondary | ICD-10-CM | POA: Diagnosis not present

## 2015-04-25 LAB — POCT INR: INR: 3.2

## 2015-04-25 LAB — POCT GLUCOSE (DEVICE FOR HOME USE): POC Glucose: 197 mg/dl — AB (ref 70–99)

## 2015-04-25 MED ORDER — METFORMIN HCL 500 MG PO TABS: ORAL_TABLET | ORAL | Status: DC

## 2015-04-25 NOTE — Progress Notes (Signed)
   Subjective:    Patient ID: Laura Melendez, female    DOB: September 24, 1941, 74 y.o.   MRN: [id number]  HPIFollow up hospitalization.  Concerned about blood sugar. Metformin was stopped in the hospital. Blood sugar today in office 197. Ate corn flakes about 1 hour ago. Last a1c was 6.9 on 03/31/15.  INR today 3.2. Missed last fridays dose which was 1/2 tablet.   Left arm pain. Not taking anything for the pain.   25 minutes was spent reviewing with the patient and her husband the patient's hospitalization her rehabilitation plus also her current medications checking her INR we would like to keep her INR between 2 and 3 physical therapy occupational therapy speech therapy working with her to try to recover as much is possible this patient is homebound. She denies any shortness of breath headaches bleeding issues.    Review of Systems     Objective:   Physical Exam Lungs are clear hearts rate is controlled. Extremities no edema or weight down she was 101 earlier this spring she is now 92 pounds she does have weakness in the left arm. Weakness in the left leg can walk limited amounts.       Assessment & Plan:  Nutrition we discussed the importance of increasing dietary measures and increasing oral intake.  Patient with significant weakness related to her stroke she has on ongoing anticoagulation because of potential mural thrombus in the aorta. Plus also due to embolic nature. Adjusted the Coumadin today. We will have home health check the INR on Thursday. And then more than likely follow every 1-2 weeks with home health checking the INR and once we see stability we can space that out  Blood sugar has been moderately elevated although not severe morning sugar today was in the 170s but otherwise been in the 130s to 140s I would recommend metformin half tablet twice a day.  Home health is seeing her as well as physical therapy occupational therapy therapy.  This was completed as part of a  face-to-face evaluation this patient is homebound this patient is in need of these services. She will follow-up here in approximately 4 weeks. Family will send [place] some readings over the next couple weeks regarding her sugars she may end up having to go on long-acting insulin at nighttime hopefully not

## 2015-04-25 NOTE — Progress Notes (Signed)
Occupational Therapy Discharge Summary  Patient Details  Name: Laura Melendez MRN: [id number] Date of Birth: February 08, 1941   Patient has met 8 of 14 long term goals due to improved activity tolerance, improved balance, ability to compensate for deficits and improved functional use of  RIGHT upper extremity.  Patient to discharge at [facility] Assist level.  Patient's care partner is independent to provide the necessary physical and cognitive assistance at discharge.    Reasons goals not met: Pt progress limited by impaired initiation and problem-solving with RLE weakness, impaired [place] of right hand and inability to effectively weight-shift to her left while attending to seated supervised ADL.  Recommendation:  Patient will benefit from ongoing skilled OT services in home health setting to continue to advance functional skills in the area of BADL, iADL, School/Education, Vocation and Reduce care partner burden.  Equipment: No equipment provided  Reasons for discharge: discharge from hospital  Patient/family agrees with progress made and goals achieved: Yes  OT Discharge Precautions/Restrictions  Precautions Precautions: Fall Precaution Comments: R hemiplegia, R inattention, delayed initiation Restrictions Weight Bearing Restrictions: No  Pain Pain Assessment Pain Assessment: No/denies pain  ADL ADL ADL Comments: see FIM  Vision/Perception  Vision- History Patient Visual Report: No change from baseline Vision- Assessment Vision Assessment?: No apparent visual deficits   Cognition Overall Cognitive Status: Impaired/Different from baseline Arousal/Alertness: Awake/alert Orientation Level: Oriented X4 Attention: Focused Focused Attention: Appears intact Sustained Attention: Impaired Sustained Attention Impairment: Verbal basic;Functional basic Selective Attention: Impaired Selective Attention Impairment: Functional basic;Functional complex Memory: Impaired Decreased  Short Term Memory: Verbal basic;Functional basic Awareness: Impaired Awareness Impairment: Emergent impairment;Anticipatory impairment Problem Solving: Impaired Problem Solving Impairment: Functional basic;Functional complex Executive Function: Initiating Sequencing: Impaired Sequencing Impairment: Functional basic;Functional complex Initiating: Impaired Initiating Impairment: Functional basic;Functional complex Safety/Judgment: Impaired  Sensation Sensation Light Touch: Appears Intact Stereognosis: Impaired Detail (RUE) Hot/Cold: Appears Intact Proprioception: Impaired Detail Proprioception Impaired Details: Impaired RLE;Impaired RUE Coordination Gross Motor Movements are Fluid and Coordinated: No Fine Motor Movements are Fluid and Coordinated: No Coordination and Movement Description: decreased initiation, decreased [place] of right hand, decreased grip/pinch strength right hand  Motor  Motor Motor: Hemiplegia;Abnormal tone;Motor apraxia;Abnormal postural alignment and control;Motor impersistence  Mobility  Bed Mobility Bed Mobility: Rolling Left;Left Sidelying to Sit;Sit to Supine Rolling Left: 5: Supervision Rolling Left Details: Verbal cues for technique Left Sidelying to Sit: 4: Min assist;HOB flat Left Sidelying to Sit Details: Manual facilitation for placement Supine to Sit: 4: Min assist Sit to Supine: 4: Min assist Sit to Supine - Details: Manual facilitation for placement Transfers Transfers: Sit to Stand;Stand to Sit Sit to Stand: 4: Min guard Sit to Stand Details: Tactile cues for posture;Tactile cues for weight shifting;Tactile cues for initiation;Tactile cues for sequencing;Verbal cues for sequencing;Visual cues for safe use of DME/AE Stand to Sit: 4: Min assist Stand to Sit Details (indicate cue type and reason): Tactile cues for initiation;Tactile cues for posture;Verbal cues for sequencing;Visual cues for safe use of DME/AE   Trunk/Postural Assessment   Cervical Assessment Cervical Assessment: Within Functional Limits Thoracic Assessment Thoracic Assessment: Within Functional Limits Lumbar Assessment Lumbar Assessment: Within Functional Limits Postural Control Postural Control: Deficits on evaluation Righting Reactions: delayed Protective Responses: delayed Postural Limitations: sacral sitting and leans laterally in sit after a prolonged period of time   Balance Balance Balance Assessed: Yes Timed Up and Go Test TUG: Normal TUG Normal TUG (seconds): 46 Static Sitting Balance Static Sitting - Balance Support: Right upper extremity supported;Left upper extremity supported;Feet  supported Static Sitting - Level of Assistance: 5: Stand by assistance Dynamic Sitting Balance Dynamic Sitting - Balance Support: Bilateral upper extremity supported;Feet supported Dynamic Sitting - Level of Assistance: 5: Stand by assistance Dynamic Sitting - Balance Activities: Forward lean/weight shifting;Reaching for objects Static Standing Balance Static Standing - Balance Support: Bilateral upper extremity supported;During functional activity Static Standing - Level of Assistance: 5: Stand by assistance Dynamic Standing Balance Dynamic Standing - Balance Support: During functional activity;Bilateral upper extremity supported Dynamic Standing - Level of Assistance: 4: Min assist Dynamic Standing - Balance Activities: Reaching across midline;Lateral lean/weight shifting  Extremity/Trunk Assessment RUE Assessment RUE Assessment: Exceptions to [facility] RUE AROM (degrees) Overall AROM Right Upper Extremity: Deficits RUE Overall AROM Comments: arm flexion limited to 90 degrees due to hypertonia and muscle weakness; elbow/grip wfl RUE Strength RUE Overall Strength: Deficits RUE Overall Strength Comments: arm flexion 2+/5, elbow & grip grossly 4/5 RUE Tone RUE Tone: Hypertonic LUE Assessment LUE Assessment: Exceptions to WFL LUE AROM (degrees) Overall  AROM Left Upper Extremity: Deficits;Due to pain;Due to premorbid status LUE Overall AROM Comments: shoulder elevation limited due to pain from premorbid rotator cuff issues; elbow/wrist/grip wfl LUE Strength LUE Overall Strength: Deficits;Due to pain;Due to premorbid status LUE Overall Strength Comments: shoulder flexion 2+/5, elbow & grip grossly 4+/5  See FIM for current functional status  Addis 04/26/2015, 3:29 PM

## 2015-04-25 NOTE — Patient Instructions (Addendum)
Goal on sugars  For now check sugars twice a day Before brakfast and before dinner  Ask the Home health nurse to fax [place] readings  plrase call if any problems    AM readings to be less than 130 Before supper less than 160  Send readings to [place] weekly for the next 3 to 4 weeks  The Home health nurse will do INR  Recheck here in 1 month  Start metformin 500 use 1/2 in am 1/2 at supper

## 2015-04-26 NOTE — Telephone Encounter (Signed)
The patient was seen.

## 2015-04-28 ENCOUNTER — Telehealth: Payer: Self-pay | Admitting: Family Medicine

## 2015-04-28 NOTE — Telephone Encounter (Signed)
Nurses, please call home health today. Inform them to not take Coumadin Thursday or Friday. Then then take her medication as per the anticoagulant tracking walled. One half tablet on Sunday, one on Monday, one half on Tuesday, one on Wednesday, one on Thursday, one half on Friday, one on Saturday. Repeat INR in 1 week

## 2015-04-28 NOTE — Telephone Encounter (Signed)
INR results from [place] from [place]  3.5   Please call her back an let her know about dosing and when to check again [phone]

## 2015-04-28 NOTE — Telephone Encounter (Signed)
Discussed with pt's husband and with home health nurse Sarah.

## 2015-04-29 ENCOUNTER — Telehealth: Payer: Self-pay | Admitting: *Deleted

## 2015-04-29 NOTE — Telephone Encounter (Signed)
Recd approval for patient's Ritalin 5 mg tablet - BID.  Authorization good from 12/17/2014 thru 12/17/15

## 2015-05-05 ENCOUNTER — Encounter: Payer: Self-pay | Admitting: Vascular Surgery

## 2015-05-06 ENCOUNTER — Encounter: Payer: Self-pay | Admitting: Vascular Surgery

## 2015-05-06 ENCOUNTER — Ambulatory Visit (INDEPENDENT_AMBULATORY_CARE_PROVIDER_SITE_OTHER): Payer: Medicare HMO | Admitting: Vascular Surgery

## 2015-05-06 VITALS — BP 119/73 | HR 51 | Ht 61.0 in | Wt 93.8 lb

## 2015-05-06 DIAGNOSIS — E119 Type 2 diabetes mellitus without complications: Secondary | ICD-10-CM | POA: Diagnosis not present

## 2015-05-06 DIAGNOSIS — I639 Cerebral infarction, unspecified: Secondary | ICD-10-CM | POA: Diagnosis not present

## 2015-05-06 DIAGNOSIS — I69891 Dysphagia following other cerebrovascular disease: Secondary | ICD-10-CM | POA: Diagnosis not present

## 2015-05-06 DIAGNOSIS — F329 Major depressive disorder, single episode, unspecified: Secondary | ICD-10-CM

## 2015-05-06 DIAGNOSIS — I10 Essential (primary) hypertension: Secondary | ICD-10-CM | POA: Diagnosis not present

## 2015-05-06 DIAGNOSIS — Z5181 Encounter for therapeutic drug level monitoring: Secondary | ICD-10-CM

## 2015-05-06 DIAGNOSIS — I7410 Embolism and thrombosis of unspecified parts of aorta: Secondary | ICD-10-CM | POA: Diagnosis not present

## 2015-05-06 DIAGNOSIS — I69351 Hemiplegia and hemiparesis following cerebral infarction affecting right dominant side: Secondary | ICD-10-CM | POA: Diagnosis not present

## 2015-05-06 DIAGNOSIS — Z7901 Long term (current) use of anticoagulants: Secondary | ICD-10-CM

## 2015-05-06 NOTE — Progress Notes (Addendum)
    Established Carotid Patient  History of Present Illness  Laura Melendez is a 74 y.o. (1940/12/19) female who presents with chief complaint: recovering from CVA.  This patient was seen in the hospital with L side stroke with R side motor weakness.  Her studies suggested <19% LICA stenosis with possible ulceration on CTA.  Her chest CTA also demonstrated thrombus in her descending thoracic aorta.  The patient has been undergoing PT at home with improvement in strength but she still has significant limitations in ADL due to weakness in her dominant hand.  She is currently on Warfarin.  The patient's PMH, PSH, SH, FamHx, Med, and Allergies are unchanged from 03/31/15.  On ROS today: continue mild right arm weakness, no aphasia, no visual sx  Physical Examination  Filed Vitals:   05/06/15 1039 05/06/15 1040  BP: 153/64 119/73  Pulse: 51   Height: 5\' 1"  (1.549 m)   Weight: 93 lb 12.8 oz (42.547 kg)   SpO2: 97%    Body mass index is 17.73 kg/(m^2).  General: A&O x 3, WD, thin  Eyes: PERRLA, EOMI  Neck: Supple, no nuchal rigidity, no palpable LAD  Pulmonary: Sym exp, good air movt, CTAB, no rales, rhonchi, & wheezing  Cardiac: RRR, Nl S1, S2, no Murmurs, rubs or gallops  Vascular: Vessel Right Left  Radial Palpable Palpable  Brachial Palpable Palpable  Carotid Palpable, without bruit Palpable, without bruit  Aorta Not palpable N/A  Femoral Palpable Palpable  Popliteal Not palpable Not palpable  PT Palpable Palpable  DP Palpable Palpable   Gastrointestinal: soft, NTND, -G/R, - HSM, - masses, - CVAT B  Musculoskeletal: M/S 5/5 except RUE 4/5, Extremities without ischemic changes , increased tremor with use of upper extremities  Neurologic: CN 2-12 intact , Pain and light touch intact in extremities , Motor exam as listed above   Medical Decision Making  Laura Melendez is a 74 y.o. female who presents with: B asx ICA stenosis <50%, DTA thrombus   Based on NASCET, no  advantage to L CEA as L ICA <50% stenosis.  I am also not convinced the heterogenous appearance of the left ICA bulb is responsible for the L CVA.  Regardless the anticoagulation for the DTA thrombus will also help prevent any thrombus aggregation in the L ICA bulb.  Based on the patient's vascular studies and examination, I have offered the patient: CTA chest in 6 months.  If the DTA thrombus is resolved in 6 months, the anticoagulation can be discontinued.  I discussed in depth with the patient the nature of atherosclerosis, and emphasized the importance of maximal medical management including strict control of blood pressure, blood glucose, and lipid levels, antiplatelet agents, obtaining regular exercise, and cessation of smoking.    The patient is aware that without maximal medical management the underlying atherosclerotic disease process will progress, limiting the benefit of any interventions. The patient is currently on a statin: Pravachol. The patient is currently not on an anti-platelet: due to anticoagulation with Warfarin.  Thank you for allowing [place] to participate in this patient's care.  Adele Barthel, MD [facility] Office: [phone] Pager: [phone]  05/06/2015, 2:01 PM

## 2015-05-09 NOTE — Addendum Note (Signed)
Addended by: Reola Calkins on: 05/09/2015 04:02 PM   Modules accepted: Orders

## 2015-05-10 ENCOUNTER — Encounter: Payer: Self-pay | Admitting: Family Medicine

## 2015-05-10 ENCOUNTER — Ambulatory Visit (INDEPENDENT_AMBULATORY_CARE_PROVIDER_SITE_OTHER): Payer: Medicare HMO | Admitting: Family Medicine

## 2015-05-10 VITALS — BP 118/62 | Ht 61.0 in | Wt 93.0 lb

## 2015-05-10 DIAGNOSIS — R300 Dysuria: Secondary | ICD-10-CM | POA: Diagnosis not present

## 2015-05-10 DIAGNOSIS — I639 Cerebral infarction, unspecified: Secondary | ICD-10-CM | POA: Diagnosis not present

## 2015-05-10 DIAGNOSIS — Z7901 Long term (current) use of anticoagulants: Secondary | ICD-10-CM

## 2015-05-10 DIAGNOSIS — G259 Extrapyramidal and movement disorder, unspecified: Secondary | ICD-10-CM | POA: Diagnosis not present

## 2015-05-10 LAB — POCT URINALYSIS DIPSTICK
PH UA: 6
Spec Grav, UA: 1.02

## 2015-05-10 MED ORDER — CEFPROZIL 250 MG PO TABS: 250.0000 mg | ORAL_TABLET | Freq: Two times a day (BID) | ORAL | Status: DC

## 2015-05-10 NOTE — Progress Notes (Signed)
   Subjective:    Patient ID: Laura Melendez, female    DOB: Dec 17, 1941, 74 y.o.   MRN: [id number]  HPI Patient arrives having a hard time holding urine today. This patient did have a stroke. She also had what appeared to be partial thrombus in the aorta. She is on Coumadin currently. She had thrombo-embolic stroke. She is gradually recovering some on the right side her speech is actually doing much better than what it was she does have some moderate cognitive issues. She also has some ataxia and weakness on the right side.  Over the past few weeks is had several different times involuntary urination they are trying the husband is trying to put her on a regular schedule of urination to see if this will help. There is no dysuria and no fever or chills no abdominal pain or flank pain.  Review of Systems    see above Objective:   Physical Exam  Lungs are clear hearts regular pulse normal weakness noted on the right side patient is able to walk but she has a tendency to use short steps and often mild ataxia to the right.      Assessment & Plan:  Occupational physical therapy are still working with her to regain better strength on the right side. Warnings were given regarding uneven surfaces and the risk of falling  UTI culture sent for antibody prescribed I highly recommend for the patient to follow-up in 7 or 8 days for recheck of INR  Her INR looks good currently antibody which could cause it to get higher. We will recheck it again next week. In addition to this when she sees neurology I highly recommend they discussed the possibility of using Eliquis this would result in less office visits as well as may be safer in regards to less risk of bleeding.  The importance of keeping blood pressure and cholesterol under control were discussed follow-up again within 3 months no lab work on today's visit  Finally the patient is having some intermittent movement disorders of the right arm that  certainly sound like a partial seizure. She has appointment coming up with [facility] neurology. This patient may well benefit from having an EEG. I did inform the family what to watch for regarding the possibility of a full-blown seizure and immediately calling 911. The likelihood of this is low.  The husband did review with me her glucose readings I did talk with him that if he starts having readings in the 80s or 70s to notify us immediately morning sugars are in the 110 to 1:30 range evening in the 140 range we will check an A1c again in August

## 2015-05-12 ENCOUNTER — Ambulatory Visit: Payer: Medicare HMO

## 2015-05-12 LAB — URINE CULTURE

## 2015-05-13 ENCOUNTER — Ambulatory Visit: Payer: Medicare HMO

## 2015-05-18 ENCOUNTER — Telehealth: Payer: Self-pay | Admitting: *Deleted

## 2015-05-18 ENCOUNTER — Ambulatory Visit (INDEPENDENT_AMBULATORY_CARE_PROVIDER_SITE_OTHER): Payer: Medicare HMO | Admitting: *Deleted

## 2015-05-18 DIAGNOSIS — Z7901 Long term (current) use of anticoagulants: Secondary | ICD-10-CM | POA: Diagnosis not present

## 2015-05-18 NOTE — Telephone Encounter (Signed)
Alfonse Flavors OT from [facility] Calling to report that pt fell yesterday. No injury or bruising.

## 2015-05-19 ENCOUNTER — Telehealth: Payer: Self-pay | Admitting: *Deleted

## 2015-05-19 NOTE — Telephone Encounter (Signed)
Stacey physical therapist wants order to continue PT 2 times per week for 2 3 weeks.

## 2015-05-19 NOTE — Telephone Encounter (Signed)
That is perfectly fine go ahead and give the order. Reason for this stroke and weakness with ataxia and mild aphasia

## 2015-05-19 NOTE — Telephone Encounter (Signed)
Rhonda OT from [facility] calling to get a verbal ok to continue OT for 2times weekly for the next 3 weeks.

## 2015-05-19 NOTE — Telephone Encounter (Signed)
Perfectly fine to give disorder along with reason being stroke weakness

## 2015-05-20 NOTE — Telephone Encounter (Signed)
Left message on voicemail notifying Suanne Marker that Dr. Nicki Reaper gave verbal order to go ahead and continue OT.

## 2015-05-23 ENCOUNTER — Ambulatory Visit (INDEPENDENT_AMBULATORY_CARE_PROVIDER_SITE_OTHER): Payer: Medicare HMO | Admitting: Family Medicine

## 2015-05-23 ENCOUNTER — Encounter: Payer: Self-pay | Admitting: Family Medicine

## 2015-05-23 VITALS — BP 128/64 | Ht 61.0 in | Wt 93.2 lb

## 2015-05-23 DIAGNOSIS — I10 Essential (primary) hypertension: Secondary | ICD-10-CM | POA: Diagnosis not present

## 2015-05-23 DIAGNOSIS — K116 Mucocele of salivary gland: Secondary | ICD-10-CM

## 2015-05-23 NOTE — Progress Notes (Signed)
   Subjective:    Patient ID: Laura Melendez, female    DOB: 09/25/1941, 74 y.o.   MRN: [id number]  HPI Patient is here today for a follow up visit on her stroke. Patient is doing well. Patient has no concerns at this time.   patient has had a stroke she has right-sided weakness physical therapy occupational therapy working with her has hard time standing up has some difficulty with walking around no other particular problems. Patient is concerned about what she thinks is a cyst versus a growth in the left upper gum region. She denies any headaches chest tightness pressure pain  Review of Systems  Constitutional: Negative for activity change, appetite change and fatigue.  HENT: Negative for congestion.   Respiratory: Negative for cough.   Cardiovascular: Negative for chest pain.  Gastrointestinal: Negative for abdominal pain.  Endocrine: Negative for polydipsia and polyphagia.  Neurological: Negative for weakness.  Psychiatric/Behavioral: Negative for confusion.       Objective:   Physical Exam  Constitutional: She appears well-nourished. No distress.  Cardiovascular: Normal rate, regular rhythm and normal heart sounds.   No murmur heard. Pulmonary/Chest: Effort normal and breath sounds normal. No respiratory distress.  Musculoskeletal: She exhibits no edema.  Lymphadenopathy:    She has no cervical adenopathy.  Neurological: She is alert. She exhibits normal muscle tone.  Psychiatric: Her behavior is normal.  Vitals reviewed.     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

## 2015-05-25 ENCOUNTER — Encounter: Payer: Self-pay | Admitting: Family Medicine

## 2015-06-02 ENCOUNTER — Ambulatory Visit: Payer: Medicare HMO | Admitting: Neurology

## 2015-06-06 ENCOUNTER — Other Ambulatory Visit: Payer: Self-pay | Admitting: Physical Medicine & Rehabilitation

## 2015-06-07 ENCOUNTER — Encounter: Payer: Self-pay | Admitting: Family Medicine

## 2015-06-08 ENCOUNTER — Telehealth: Payer: Self-pay | Admitting: Family Medicine

## 2015-06-08 NOTE — Telephone Encounter (Signed)
[facility] wanted you to know that patient had a fall last night but no injuries.

## 2015-06-08 NOTE — Telephone Encounter (Signed)
So noted 

## 2015-06-09 ENCOUNTER — Encounter: Payer: Self-pay | Admitting: Neurology

## 2015-06-09 ENCOUNTER — Ambulatory Visit (INDEPENDENT_AMBULATORY_CARE_PROVIDER_SITE_OTHER): Payer: Medicare HMO | Admitting: Neurology

## 2015-06-09 VITALS — BP 137/60 | HR 60 | Ht 61.0 in | Wt 87.5 lb

## 2015-06-09 DIAGNOSIS — R55 Syncope and collapse: Secondary | ICD-10-CM | POA: Diagnosis not present

## 2015-06-09 DIAGNOSIS — I7410 Embolism and thrombosis of unspecified parts of aorta: Secondary | ICD-10-CM

## 2015-06-09 DIAGNOSIS — E118 Type 2 diabetes mellitus with unspecified complications: Secondary | ICD-10-CM

## 2015-06-09 DIAGNOSIS — I639 Cerebral infarction, unspecified: Secondary | ICD-10-CM

## 2015-06-09 DIAGNOSIS — R269 Unspecified abnormalities of gait and mobility: Secondary | ICD-10-CM | POA: Diagnosis not present

## 2015-06-09 NOTE — Progress Notes (Signed)
PATIENT: Laura Melendez DOB: [birth year]  Chief Complaint  Patient presents with  . Cerebrovascular Accident    MMSE 22/30 - 4 animals. She had a stroke in May 2016 and is here for her hospital follow up.  She is here with her husband, [name], who says this is her second stroke.  She is still his home PT, OT and speech therapy.    HISTORICAL  Laura Melendez is a 74 years old right-handed female, accompanied by her husband, seen in refer from her primary care physician Dr. [name] for stroke, memory loss  She has a vascular risk factor of hypertension diabetes, hyperlipidemia, was put on Coumadin since her most recent stroke in March 30 2015, Dr. Lance Sell office is monitoring her INR, she retired from warehouse at age 16, before her stroke in April 2016,she was highly active, driving, cooking Sunday dinner for 11 family members on weekly basis.  In March 30 2015, she was found to have mild confusion, right arm leg weakness, gait difficulty, was admitted to the hospital from April 13-16, later discharged to rehabilitation till Apr 22 2015, had extensive evaluation  I have personally reviewed MRI of brain 03/30/2015,  Punctate acute left frontal lobe infarcts. Moderate chronic small vessel ischemic disease and chronic lacunar infarcts. moderate atrophy  MRA of brain 03/30/2015: No major intracranial arterial occlusion or significant proximal anterior circulation stenosis. Moderate proximal right PCA stenosis.   Ct Angio Neck with without contrast 4/14/2016Moderate plaque in the aortic arch with mural soft plaque or thrombus up to 8 mm in thickness. 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 No other Hemodynamically significant stenosis of the carotid or vertebral arteries in the neck. 2. 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   2D echo April 2016 : The estimated ejection fraction was in the range of 55% to 60%. 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  US Carotid in March 2016: Scattered atherosclerotic plaque throughout the carotid arteries without significant stenosis. Estimated degree of stenosis in the internal carotid arteries is less than 50% bilaterally. Concern for plaque ulcerations, particularly at the left carotid bulb. Patent vertebral arteries.  CTA of chest April 2016: No overt cardioembolic source identified. 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  No evidence of thoracic aortic aneurysm or dissection.  No evidence of pulmonary embolism  She was evaluated by vascular surgeon Dr. Bridgett Larsson in May 06 2015: Descending thoracic aorta thrombosis, suggested continue Coumadin, repeat CT and Jewel chest in 6 months, if thrombosis is resolved in 6 months, then anticoagulation can be discontinued. She is not surgical candidate for left carotid endarterectomy.  Her husband also reported fainting spells, most recent was in May 2016, during rehabilitation, she suddenly felt is coming on lightheaded, transient blackout, was due to hypotension per her husband  Since her stroke, she also had memory trouble, gait difficulty, slowly improving   REVIEW OF SYSTEMS: Full 14 system review of systems performed and notable only for weight loss, incontinence, constipation, increased thirst, memory loss, passing out, tremor depression  ALLERGIES: No Known Allergies  HOME MEDICATIONS: Current Outpatient Prescriptions  Medication Sig Dispense Refill  . alendronate (FOSAMAX) 70 MG tablet Take 1 tablet (70 mg total) by mouth every 7 (seven) days. 12 tablet 3  . Calcium Carb-Cholecalciferol (CALCIUM 600/VITAMIN D3) 600-800 MG-UNIT TABS Take 1  tablet by mouth 2 (two) times daily with a meal.    . FLUoxetine (PROZAC) 10 MG capsule Take 1 capsule (10 mg total) by mouth daily. For depression/anxiety 30 capsule 1  . lisinopril  (PRINIVIL,ZESTRIL) 2.5 MG tablet Take 1 tablet (2.5 mg total) by mouth daily. 30 tablet 1  . metFORMIN (GLUCOPHAGE) 500 MG tablet Take one half tablet BID 30 tablet 5  . methylphenidate (RITALIN) 5 MG tablet Take 1 tablet (5 mg total) by mouth 2 (two) times daily with breakfast and lunch. Do not refill till 05/19/15 60 tablet 0  . metoprolol (LOPRESSOR) 50 MG tablet Take 1 tablet (50 mg total) by mouth 2 (two) times daily. 60 tablet 1  . Multiple Vitamin (MULTIVITAMIN WITH MINERALS) TABS Take 1 tablet by mouth daily.    Marland Kitchen NIFEdipine (PROCARDIA XL/ADALAT-CC) 60 MG 24 hr tablet Take 1 tablet (60 mg total) by mouth daily. DO NOT CRUSH 30 tablet 1  . pravastatin (PRAVACHOL) 40 MG tablet Take 1 tablet (40 mg total) by mouth daily. 30 tablet 1  . raloxifene (EVISTA) 60 MG tablet Take 1 tablet (60 mg total) by mouth daily. 90 tablet 3  . senna-docusate (SENOKOT-S) 8.6-50 MG per tablet Take 2 tablets by mouth 2 (two) times daily. For constipation 100 tablet 1  . warfarin (COUMADIN) 2 MG tablet Take 1/2 a pill on Friday evening and take a whole pill on all other days. 30 tablet 1     PAST MEDICAL HISTORY: Past Medical History  Diagnosis Date  . Essential hypertension, benign   . Type 2 diabetes mellitus   . Osteoporosis 06/2008  . Stroke     PAST SURGICAL HISTORY: Past Surgical History  Procedure Laterality Date  . Ankle surgery Left 2004    otif  . Abdominal hysterectomy    . Anterior and posterior repair N/A 03/11/2013    Procedure: ANTERIOR (CYSTOCELE) AND POSTERIOR REPAIR (RECTOCELE);  Surgeon: Linda Hedges, DO;  Location: [facility];  Service: Gynecology;  Laterality: N/A;  with sacrospinous ligament fixation bilateral  . Colonoscopy  2009    negative  . Cataract extraction w/phaco Right 12/27/2014    Procedure: CATARACT EXTRACTION PHACO AND INTRAOCULAR LENS PLACEMENT RIGHT EYE;  Surgeon: Tonny Branch, MD;  Location: [facility];  Service: Ophthalmology;  Laterality: Right;  CDE:10.63  . Cataract  extraction w/phaco Left 01/13/2015    Procedure: CATARACT EXTRACTION PHACO AND INTRAOCULAR LENS PLACEMENT LEFT EYE;  Surgeon: Tonny Branch, MD;  Location: [facility];  Service: Ophthalmology;  Laterality: Left;  CDE:18.87  . Cholecystectomy    . Tee without cardioversion N/A 04/01/2015    Procedure: TRANSESOPHAGEAL ECHOCARDIOGRAM (TEE);  Surgeon: Sanda Klein, MD;  Location: [facility];  Service: Cardiovascular;  Laterality: N/A;    FAMILY HISTORY: Family History  Problem Relation Age of Onset  . Cancer Father     Liver  . Cancer Sister     Breast  . Diabetes Brother   . Dementia Mother     SOCIAL HISTORY:  History   Social History  . Marital Status: Married    Spouse Name: N/A  . Number of Children: 3  . Years of Education: 12   Occupational History  . Retired    Social History Main Topics  . Smoking status: Former Smoker -- 0.25 packs/day for 30 years    Types: Cigarettes    Quit date: 02/27/2015  . Smokeless tobacco: Not on file  . Alcohol Use: No  . Drug Use: No  . Sexual  Activity: Yes    Birth Control/ Protection: Surgical   Other Topics Concern  . Not on file   Social History Narrative   Lives at home with her husband.   Right-handed.   2 cups caffeine daily.     PHYSICAL EXAM   Filed Vitals:   06/09/15 1001  BP: 137/60  Pulse: 60  Height: 5\' 1"  (1.549 m)  Weight: 87 lb 8 oz (39.69 kg)    Not recorded      Body mass index is 16.54 kg/(m^2).  PHYSICAL EXAMNIATION:  Gen: NAD, conversant, well nourised, obese, well groomed                     Cardiovascular: Regular rate rhythm, no peripheral edema, warm, nontender. Eyes: Conjunctivae clear without exudates or hemorrhage Neck: Supple, no carotid bruise. Pulmonary: Clear to auscultation bilaterally   NEUROLOGICAL EXAM:  MENTAL STATUS: Speech:    Speech is normal; fluent and spontaneous with normal comprehension.  Cognition:    The patient is oriented to person, place, and time;     recent  and remote memory intact;     language fluent;     normal attention, concentration,     fund of knowledge.  CRANIAL NERVES: CN II: Visual fields are full to confrontation. Fundoscopic sharp disks, pupil equal round reactive to light. CN III, IV, VI: extraocular movement are normal. No ptosis. CN V: Facial sensation is intact to pinprick in all 3 divisions bilaterally. Corneal responses are intact.  CN VII: Face is symmetric with normal eye closure and smile. CN VIII: Hearing is normal to rubbing fingers CN IX, X: Palate elevates symmetrically. Phonation is normal. CN XI: Decreased right shoulder shrugging. CN XII: Tongue is midline with normal movements and no atrophy.  MOTOR: She has right upper extremity pronation drift, mild right upper and lower extremity proximal and distal weakness 4/5, right ankle tends to stay in mild ankle plantar flexion.   REFLEXES: Reflexes are hyperreflexia on the right upper and lower extremity, right plantar responses was extensor  SENSORY: Light touch, pinprick, position sense, and vibration sense are intact in fingers and toes.  COORDINATION: Rapid alternating movements and fine finger movements are intact. There is no dysmetria on finger-to-nose and heel-knee-shin. There are no abnormal or extraneous movements.   GAIT/STANCE: Mildly unsteady, dragging her right foot across the floor, holding her left arm in right shoulder abduction, mild elbow flexion pronation  DIAGNOSTIC DATA (LABS, IMAGING, TESTING) - I reviewed patient records, labs, notes, testing and imaging myself where available.  Lab Results  Component Value Date   WBC 8.7 04/18/2015   HGB 11.7* 04/18/2015   HCT 35.4* 04/18/2015   MCV 81.0 04/18/2015   PLT 237 04/18/2015      Component Value Date/Time   NA 139 04/18/2015 0440   NA 142 02/21/2015 0810   K 4.1 04/18/2015 0440   CL 103 04/18/2015 0440   CO2 27 04/18/2015 0440   GLUCOSE 119* 04/18/2015 0440   GLUCOSE 130*  02/21/2015 0810   BUN 9 04/18/2015 0440   BUN 16 02/21/2015 0810   CREATININE 0.57 04/18/2015 0440   CREATININE 0.66 09/06/2014 0925   CALCIUM 9.2 04/18/2015 0440   PROT 6.9 04/02/2015 1810   ALBUMIN 3.5 04/02/2015 1810   AST 24 04/02/2015 1810   ALT 21 04/02/2015 1810   ALKPHOS 38* 04/02/2015 1810   BILITOT 0.5 04/02/2015 1810   GFRNONAA >60 04/18/2015 0440   GFRAA >60 04/18/2015 0440  Lab Results  Component Value Date   CHOL 166 03/31/2015   HDL 41 03/31/2015   LDLCALC 91 03/31/2015   TRIG 170* 03/31/2015   CHOLHDL 4.0 03/31/2015   Lab Results  Component Value Date   HGBA1C 6.9* 03/31/2015   No results found for: VITAMINB12 No results found for: TSH    ASSESSMENT AND PLAN  MORGANE JOERGER is a 74 y.o. female  with vascular risk factor of hypertension, diabetes, hyperlipidemia, presented with left subcortical stroke, with mild residual right-sided weakness.  1, Stroke, is on Coumadin because of thrombosis at descending thoracic aorta, not a surgical candidate with left internal carotid artery less than 50% stenosis, 2, continue address vascular risk factor, goal blood pressure less than 1:30 out of 80, LDL less than 70 3. Memory loss, likely a vascular component, 4, Passing out episode, syncope, versus subclinical seizure, EEG,  5, return to clinic in 3-4 months.      [name], M.D. Ph.D.  [facility] [address] [place], [place] [address] Ph: [phone] Fax: [phone]

## 2015-06-10 ENCOUNTER — Ambulatory Visit (INDEPENDENT_AMBULATORY_CARE_PROVIDER_SITE_OTHER): Payer: Medicare HMO | Admitting: *Deleted

## 2015-06-10 DIAGNOSIS — Z7901 Long term (current) use of anticoagulants: Secondary | ICD-10-CM | POA: Diagnosis not present

## 2015-06-10 LAB — POCT INR: INR: 1.5

## 2015-06-13 ENCOUNTER — Ambulatory Visit (INDEPENDENT_AMBULATORY_CARE_PROVIDER_SITE_OTHER): Payer: Medicare HMO | Admitting: Neurology

## 2015-06-13 DIAGNOSIS — R55 Syncope and collapse: Secondary | ICD-10-CM

## 2015-06-13 DIAGNOSIS — I639 Cerebral infarction, unspecified: Secondary | ICD-10-CM

## 2015-06-13 DIAGNOSIS — I7410 Embolism and thrombosis of unspecified parts of aorta: Secondary | ICD-10-CM

## 2015-06-13 DIAGNOSIS — E118 Type 2 diabetes mellitus with unspecified complications: Secondary | ICD-10-CM

## 2015-06-13 DIAGNOSIS — R269 Unspecified abnormalities of gait and mobility: Secondary | ICD-10-CM

## 2015-06-15 ENCOUNTER — Encounter: Payer: Self-pay | Admitting: Physical Medicine & Rehabilitation

## 2015-06-15 ENCOUNTER — Encounter: Payer: Medicare HMO | Attending: Physical Medicine & Rehabilitation | Admitting: Physical Medicine & Rehabilitation

## 2015-06-15 VITALS — BP 148/69 | HR 53 | Resp 14

## 2015-06-15 DIAGNOSIS — G8190 Hemiplegia, unspecified affecting unspecified side: Secondary | ICD-10-CM | POA: Diagnosis not present

## 2015-06-15 DIAGNOSIS — IMO0002 Reserved for concepts with insufficient information to code with codable children: Secondary | ICD-10-CM

## 2015-06-15 DIAGNOSIS — I6350 Cerebral infarction due to unspecified occlusion or stenosis of unspecified cerebral artery: Secondary | ICD-10-CM | POA: Insufficient documentation

## 2015-06-15 DIAGNOSIS — I63412 Cerebral infarction due to embolism of left middle cerebral artery: Secondary | ICD-10-CM | POA: Diagnosis present

## 2015-06-15 DIAGNOSIS — F329 Major depressive disorder, single episode, unspecified: Secondary | ICD-10-CM | POA: Diagnosis not present

## 2015-06-15 DIAGNOSIS — G8191 Hemiplegia, unspecified affecting right dominant side: Secondary | ICD-10-CM

## 2015-06-15 NOTE — Progress Notes (Signed)
Subjective:    Patient ID: Laura Melendez, female    DOB: January 31, 1941, 74 y.o.   MRN: [id number]  HPI   Laura Melendez is here in follow of her left frontal infarct. She is wrapping up home health therapies. She is still having some struggles with the right foot which tends to drag and catch when she walks. She has a cane but doesn't use it. She tends to walk with someone close by. She has had a few falls because of the foot catching. She feel at [facility] last week.   At home she's dressing and bathing herself although the right leg gets "stuck" on occasoin.   She's not having pain. Eating has improved, and she's on a regular diet. Her bowels have been fairly regular.   Her mood has been a little down because she feels that she "can't do anything".   Husband is here and remains supportive.      Pain Inventory Average Pain 0 Pain Right Now 0 My pain is no pain  In the last 24 hours, has pain interfered with the following? General activity 0 Relation with others 0 Enjoyment of life 0 What TIME of day is your pain at its worst? no pain Sleep (in general) Fair  Pain is worse with: no pain Pain improves with: no pain Relief from Meds: no pain  Mobility walk with assistance how many minutes can you walk? 120 ability to climb steps?  yes  Function retired I need assistance with the following:  bathing, meal prep and household duties  Neuro/Psych bladder control problems tremor trouble walking  Prior Studies hospital f/u  Physicians involved in your care hospital f/u   Family History  Problem Relation Age of Onset  . Cancer Father     Liver  . Cancer Sister     Breast  . Diabetes Brother   . Dementia Mother    History   Social History  . Marital Status: Married    Spouse Name: N/A  . Number of Children: 3  . Years of Education: 12   Occupational History  . Retired    Social History Main Topics  . Smoking status: Former Smoker -- 0.25 packs/day for 30  years    Types: Cigarettes    Quit date: 02/27/2015  . Smokeless tobacco: Not on file  . Alcohol Use: No  . Drug Use: No  . Sexual Activity: Yes    Birth Control/ Protection: Surgical   Other Topics Concern  . None   Social History Narrative   Lives at home with her husband.   Right-handed.   2 cups caffeine daily.   Past Surgical History  Procedure Laterality Date  . Ankle surgery Left 2004    otif  . Abdominal hysterectomy    . Anterior and posterior repair N/A 03/11/2013    Procedure: ANTERIOR (CYSTOCELE) AND POSTERIOR REPAIR (RECTOCELE);  Surgeon: Linda Hedges, DO;  Location: [facility];  Service: Gynecology;  Laterality: N/A;  with sacrospinous ligament fixation bilateral  . Colonoscopy  2009    negative  . Cataract extraction w/phaco Right 12/27/2014    Procedure: CATARACT EXTRACTION PHACO AND INTRAOCULAR LENS PLACEMENT RIGHT EYE;  Surgeon: Tonny Branch, MD;  Location: [facility];  Service: Ophthalmology;  Laterality: Right;  CDE:10.63  . Cataract extraction w/phaco Left 01/13/2015    Procedure: CATARACT EXTRACTION PHACO AND INTRAOCULAR LENS PLACEMENT LEFT EYE;  Surgeon: Tonny Branch, MD;  Location: [facility];  Service: Ophthalmology;  Laterality: Left;  CDE:18.87  . Cholecystectomy    . Tee without cardioversion N/A 04/01/2015    Procedure: TRANSESOPHAGEAL ECHOCARDIOGRAM (TEE);  Surgeon: Sanda Klein, MD;  Location: [facility] END[facility]COPY;  Service: Cardiovascular;  Laterality: N/A;   Past Medical History  Diagnosis Date  . Essential hypertension, benign   . Type 2 diabetes mellitus   . Osteoporosis 06/2008  . Stroke    BP 148/69 mmHg  Pulse 53  Resp 14  SpO2 99%  Opioid Risk Score:   Fall Risk Score: Moderate Fall Risk (6-13 points)`1  Depression screen PHQ 2/9  Depression screen [facility] 2/9 06/15/2015 09/28/2014 03/23/2014  Decreased Interest 2 0 0  Down, Depressed, Hopeless 2 0 0  PHQ - 2 Score 4 0 0  Altered sleeping 0 - -  Tired, decreased energy 1 - -  Change in appetite 0 - -    Feeling bad or failure about yourself  0 - -  Trouble concentrating 0 - -  Moving slowly or fidgety/restless 2 - -  Suicidal thoughts 0 - -  PHQ-9 Score 7 - -     Review of Systems  Constitutional:       Weight loss  Endocrine:       High blood sugar  Genitourinary:       Incontinence  Musculoskeletal: Positive for gait problem.  Neurological: Positive for tremors.  All other systems reviewed and are negative.      Objective:   Physical Exam  Constitutional: She appears alert, no distress. HENT: mild coating of thrush on the tongue Head: Normocephalic and atraumatic.  Eyes: Conjunctivae are normal. Pupils are equal, round, and reactive to light.  Neck: Normal range of motion. Neck supple.  Cardiovascular: Normal rate and regular rhythm. no murmur Respiratory: Effort normal and breath sounds normal. No respiratory distress. She has no wheezes.  GI: Soft. Bowel sounds are normal.  Musculoskeletal: She exhibits no edema or tenderness.  Neurological: She is alert. RUE: Language and cognition much improved. Initiates more. Able to converse. Has reasonable insight and awareness . RUE with spontaneous movement/voluntary effort now---MMT 3+ to 4/5 RUE. RLE: 3+ to 4-/5. Remains a little inconsistent due to apraxia. There is minimal tone although she displays some primitive patterns still.  DTR's 3+. Right central 7 and tongue deviation. senses pain /LT right arm and leg. Tends to walk with a slight drag of right foot, does better when reminded or cued.  M/S: no pain with range of motion right upper extremity Psych: more dynamic Skin: Skin is warm and dry. No breakdown  Assessment/Plan:   1. Functional deficits secondary to Left frontal infarct likely embolic due to aortic thrombus  -refer to outpt PT/OT  to address gait balance, RUE use, apraxia  -might benefit kick plate   2. Thoracic aortic and supraceliac aortic thrombus/Anticoagulation:   Coumadin.   -To  follow up with Dr. Bridgett Larsson in 6 month for repeat CTA chest/abd/pelvis.  3. Pain Management: Tylenol prn, minimal.  4. Reactive depression:recommend continuing with prozac  -family needs to provide positive support  5. Neuropsych: can dc ritalin trial and observe  Thirty minutes of face to face patient care time were spent during this visit. All questions were encouraged and answered. Return in about 2 months (around 08/15/2015).

## 2015-06-15 NOTE — Patient Instructions (Signed)
WE MIGHT WANT TO LOOK AT A KICK PLATE FOR THE RIGHT SHOE----LET'S GIVE THERAPY A TRY FIRST AND SEE WHERE SHE GOES.

## 2015-06-16 ENCOUNTER — Telehealth: Payer: Self-pay | Admitting: Neurology

## 2015-06-16 MED ORDER — LEVETIRACETAM 750 MG PO TABS: 750.0000 mg | ORAL_TABLET | Freq: Two times a day (BID) | ORAL | Status: DC

## 2015-06-16 NOTE — Procedures (Signed)
   HISTORY: 74 year old female, with acute left frontal stroke April 2016, with residual right-sided weakness, intermittent confusion  TECHNIQUE:  16 channel EEG was performed based on standard 10-16 international system. One channel was dedicated to EKG, which has demonstrates slow sinus rhythm of 48 beats per minutes.  Upon awakening, the posterior background activity was well-developed, with in alpha range, there was frequent intrusion of F7 sharp transient, with quick generalization of bihemisphere spike slow waves, the longest episode can last about 10-15 seconds, consistent with subclinical seizure.  There was no evidence of epileptiform discharge.  Photic stimulation and hyperventilation was not performed  No sleep was achieved.  CONCLUSION: This is a marked abnormal EEG, there was frequent by hemisphere spike slow waves, longest the last about 10-15 seconds, consistent with subclinical seizure, generalized discharge was often preceded by spike slow waves at F7, with quick generalization.

## 2015-06-16 NOTE — Telephone Encounter (Signed)
I have called her husband, EEG showed marked abnormality, consistent with complex partial seizure with quick generalization, subclinical seizure, this well explained her confusion,  I have called in Keppra 750 mg twice a day  Sharyn Lull, please give her a follow-up appointment within 2-3 weeks.Marland Kitchen

## 2015-06-17 ENCOUNTER — Encounter: Payer: Self-pay | Admitting: Family Medicine

## 2015-06-17 ENCOUNTER — Other Ambulatory Visit: Payer: Self-pay | Admitting: Physical Medicine and Rehabilitation

## 2015-06-17 ENCOUNTER — Ambulatory Visit (INDEPENDENT_AMBULATORY_CARE_PROVIDER_SITE_OTHER): Payer: Medicare HMO | Admitting: Family Medicine

## 2015-06-17 VITALS — BP 110/68 | Temp 97.7°F | Ht 61.0 in | Wt 93.0 lb

## 2015-06-17 DIAGNOSIS — N3000 Acute cystitis without hematuria: Secondary | ICD-10-CM

## 2015-06-17 DIAGNOSIS — I10 Essential (primary) hypertension: Secondary | ICD-10-CM

## 2015-06-17 DIAGNOSIS — R8290 Unspecified abnormal findings in urine: Secondary | ICD-10-CM

## 2015-06-17 DIAGNOSIS — I639 Cerebral infarction, unspecified: Secondary | ICD-10-CM | POA: Diagnosis not present

## 2015-06-17 LAB — POCT URINALYSIS DIPSTICK
Glucose, UA: 50
Nitrite, UA: NEGATIVE
PROTEIN UA: NEGATIVE
SPEC GRAV UA: 1.02
pH, UA: 5

## 2015-06-17 MED ORDER — NITROFURANTOIN MONOHYD MACRO 100 MG PO CAPS: 100.0000 mg | ORAL_CAPSULE | Freq: Two times a day (BID) | ORAL | Status: AC

## 2015-06-17 NOTE — Progress Notes (Signed)
   Subjective:    Patient ID: Laura Melendez, female    DOB: [birth year], 74 y.o.   MRN: [id number] Patient arrives office with multiple concerns. Dizziness This is a new problem. The current episode started in the past 7 days. The problem occurs constantly. Associated symptoms include urinary symptoms. Associated symptoms comments: Foul Smelling urine. Nothing aggravates the symptoms. She has tried nothing for the symptoms.   Patient would also like to discuss possible side effects of Keppra prescribed neurologist on 06/16/15. Just started Keppra at 750 mg twice a day. Since then much more dizziness unsteadiness etc.  Increased urination burning at times. Patient notes an odor. No fever no chills no vomiting.  Review of old records done in presence of patient to investigate rationale for Keppra. Patient had EEG ordered by neurologist. This revealed brainwave abnormalities suggestive of subclinical seizures. Patient has no true seizure symptoms.   Patient states no other concerns this visit.   Review of Systems  Neurological: Positive for dizziness.   no headache no chest pain no back pain no fever no chills no vomiting     Objective:   Physical Exam  Alert vitals stable no acute distress. Patient thoughtful in her answers. Appears oriented today. HEENT normal lungs clear heart regular in rhythm. No CVA tenderness. Abdomen benign.  Urinalysis 4-6 white blood cells per high-power field      Assessment & Plan:  Impression 1 urinary tract infection #2 substantial symptomatology post initiation of Keppra discussed at length. Patient due to see neurologist soon plan we will go ahead and cut the patient's Keppra dose in half for now. Advised to speak with neurologist next week at least via telephone regarding this. Macrodantin order. Easily 25 minutes spent most in discussion WSL

## 2015-06-17 NOTE — Patient Instructions (Signed)
Decrease the keppra to one half tab twice per day

## 2015-06-21 NOTE — Telephone Encounter (Signed)
Spoke to Mr. Weinheimer - earlier appt has been scheduled.  She is currently being treated for a UTI and her PCP told them to cut Keppra 750mg  down to 0.5 tab BID.  She is feeling better now and her husband is going to increase her Keppra dose back to 1 tab BID as prescribed.  I told him to call our office with any concerns.

## 2015-06-24 ENCOUNTER — Other Ambulatory Visit (INDEPENDENT_AMBULATORY_CARE_PROVIDER_SITE_OTHER): Payer: Self-pay | Admitting: Otolaryngology

## 2015-06-24 DIAGNOSIS — R220 Localized swelling, mass and lump, head: Secondary | ICD-10-CM

## 2015-06-27 ENCOUNTER — Ambulatory Visit (HOSPITAL_COMMUNITY)
Admission: RE | Admit: 2015-06-27 | Discharge: 2015-06-27 | Disposition: A | Discharge status: Home / Self Care | Payer: Medicare HMO | Source: Ambulatory Visit | Attending: Otolaryngology | Admitting: Otolaryngology

## 2015-06-27 DIAGNOSIS — R220 Localized swelling, mass and lump, head: Secondary | ICD-10-CM | POA: Diagnosis present

## 2015-06-27 LAB — POCT I-STAT CREATININE: Creatinine, Ser: 0.7 mg/dL (ref 0.44–1.00)

## 2015-06-27 MED ORDER — IOHEXOL 300 MG/ML  SOLN: 75.0000 mL | Freq: Once | INTRAMUSCULAR | Status: AC | PRN

## 2015-06-30 ENCOUNTER — Telehealth: Payer: Self-pay | Admitting: *Deleted

## 2015-06-30 DIAGNOSIS — I63412 Cerebral infarction due to embolism of left middle cerebral artery: Secondary | ICD-10-CM

## 2015-06-30 NOTE — Telephone Encounter (Signed)
Patient's husband called.  He stated that since the last office visit and Laura Melendez discontinued the Ritalin, he has noticed she is unsteady and not able to concentrate. He asked if they could resume taking the Ritalin. Please advise

## 2015-07-01 ENCOUNTER — Ambulatory Visit (INDEPENDENT_AMBULATORY_CARE_PROVIDER_SITE_OTHER): Payer: Medicare HMO

## 2015-07-01 DIAGNOSIS — Z7901 Long term (current) use of anticoagulants: Secondary | ICD-10-CM

## 2015-07-01 LAB — POCT INR: INR: 2

## 2015-07-01 MED ORDER — METHYLPHENIDATE HCL 5 MG PO TABS: ORAL_TABLET | ORAL | Status: DC

## 2015-07-01 MED ORDER — METHYLPHENIDATE HCL 5 MG PO TABS: 5.0000 mg | ORAL_TABLET | Freq: Two times a day (BID) | ORAL | Status: DC

## 2015-07-01 NOTE — Telephone Encounter (Signed)
Called patient's husband back and told him that it was OK with Dr. Naaman Plummer to restart the Ritalin 5 mg BID. Laura Melendez stated that today they were at her PCP's office and told him about the changes since patient D/C med and he wrote the prescription.

## 2015-07-01 NOTE — Telephone Encounter (Signed)
If she has struggled, then i would definitely continue the ritalin---same schedule. They will probably need an rx----i will write and they can pick up

## 2015-07-03 ENCOUNTER — Other Ambulatory Visit: Payer: Self-pay | Admitting: Physical Medicine and Rehabilitation

## 2015-07-05 ENCOUNTER — Telehealth: Payer: Self-pay | Admitting: Family Medicine

## 2015-07-05 NOTE — Telephone Encounter (Signed)
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  This test was ordered by Dr. Benjamine Mola where we referred her.  Dr. Benjamine Mola told patient that he cannot do the surgery required for this mass and wanted to send her to [facility], but patient declined and wanted Dr. Lars Mage opinion.  Please advise.

## 2015-07-07 NOTE — Telephone Encounter (Signed)
Pt's husband called back inquiring about whether or not Dr. Nicki Reaper had received a report from Dr. Benjamine Mola. Pt's husband is wanting to know what they should do. Please advise.

## 2015-07-08 ENCOUNTER — Other Ambulatory Visit: Payer: Self-pay | Admitting: Physical Medicine and Rehabilitation

## 2015-07-12 ENCOUNTER — Other Ambulatory Visit: Payer: Self-pay | Admitting: Family Medicine

## 2015-07-12 ENCOUNTER — Encounter: Payer: Self-pay | Admitting: Neurology

## 2015-07-12 ENCOUNTER — Ambulatory Visit (INDEPENDENT_AMBULATORY_CARE_PROVIDER_SITE_OTHER): Payer: Medicare HMO | Admitting: Neurology

## 2015-07-12 VITALS — BP 151/59 | HR 58 | Ht 61.0 in | Wt 92.0 lb

## 2015-07-12 DIAGNOSIS — R569 Unspecified convulsions: Secondary | ICD-10-CM | POA: Diagnosis not present

## 2015-07-12 DIAGNOSIS — I639 Cerebral infarction, unspecified: Secondary | ICD-10-CM

## 2015-07-12 DIAGNOSIS — R269 Unspecified abnormalities of gait and mobility: Secondary | ICD-10-CM | POA: Diagnosis not present

## 2015-07-12 MED ORDER — WARFARIN SODIUM 2 MG PO TABS: ORAL_TABLET | ORAL | Status: DC

## 2015-07-12 MED ORDER — NIFEDIPINE ER OSMOTIC RELEASE 60 MG PO TB24: 60.0000 mg | ORAL_TABLET | Freq: Every day | ORAL | Status: DC

## 2015-07-12 MED ORDER — LISINOPRIL 2.5 MG PO TABS: 2.5000 mg | ORAL_TABLET | Freq: Every day | ORAL | Status: DC

## 2015-07-12 MED ORDER — RALOXIFENE HCL 60 MG PO TABS: 60.0000 mg | ORAL_TABLET | Freq: Every day | ORAL | Status: DC

## 2015-07-12 MED ORDER — PRAVASTATIN SODIUM 40 MG PO TABS: 40.0000 mg | ORAL_TABLET | Freq: Every day | ORAL | Status: DC

## 2015-07-12 NOTE — Telephone Encounter (Signed)
pravastatin (PRAVACHOL) 40 MG tablet warfarin (COUMADIN) 2 MG tablet warfarin (COUMADIN) 2 MG tablet NIFEdipine (PROCARDIA XL/ADALAT-CC) 60 MG 24 hr tablet  raloxifene (EVISTA) 60 MG tablet  pts spouse states she needs these meds switched to our office and Refilled please to [facility]

## 2015-07-12 NOTE — Progress Notes (Signed)
Chief Complaint  Patient presents with  . Cerebrovascular Accident    MMSE 29/30 - 7 animals.  She is here with her husband, Laura Melendez, to discuss her EEG results.  She is planning to restart both PT and OT within the week.      PATIENT: Laura Melendez DOB: Nov 22, 1941  Chief Complaint  Patient presents with  . Cerebrovascular Accident    MMSE 29/30 - 7 animals.  She is here with her husband, Laura Melendez, to discuss her EEG results.  She is planning to restart both PT and OT within the week.    HISTORICAL  Laura Melendez is a 75 years old right-handed female, accompanied by her husband, seen in refer from her primary care physician Dr. [name] for stroke, memory loss  She has a vascular risk factor of hypertension diabetes, hyperlipidemia, was put on Coumadin since her most recent stroke in March 30 2015, Dr. Lance Sell office is monitoring her INR, she retired from warehouse at age 35, before her stroke in April 2016,she was highly active, driving, cooking Sunday dinner for 11 family members on weekly basis.  In March 30 2015, she was found to have mild confusion, right arm leg weakness, gait difficulty, was admitted to the hospital from April 13-16, later discharged to rehabilitation till Apr 22 2015, had extensive evaluation  I have personally reviewed MRI of brain 03/30/2015,  Punctate acute left frontal lobe infarcts. Moderate chronic small vessel ischemic disease and chronic lacunar infarcts. moderate atrophy  MRA of brain 03/30/2015: No major intracranial arterial occlusion or significant proximal anterior circulation stenosis. Moderate proximal right PCA stenosis.   Ct Angio Neck with without contrast 4/14/2016Moderate plaque in the aortic arch with mural soft plaque or thrombus up to 8 mm in thickness. 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 No other Hemodynamically significant stenosis of the carotid or vertebral arteries in the neck. 2. 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   2D echo April 2016 : The estimated ejection fraction was in the range of 55% to 60%. 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  US Carotid in March 2016: Scattered atherosclerotic plaque throughout the carotid arteries without significant stenosis. Estimated degree of stenosis in the internal carotid arteries is less than 50% bilaterally. Concern for plaque ulcerations, particularly at the left carotid bulb. Patent vertebral arteries.  CTA of chest April 2016: No overt cardioembolic source identified. 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  No evidence of thoracic aortic aneurysm or dissection.  No evidence of pulmonary embolism  She was evaluated by vascular surgeon Dr. Bridgett Larsson in May 06 2015: Descending thoracic aorta thrombosis, suggested continue Coumadin, repeat CTA Chest in 6 months, if thrombosis is resolved in 6 months, then anticoagulation can be discontinued. She is not surgical candidate for left carotid endarterectomy.  Her husband also reported fainting spells, most recent was in May 2016, during rehabilitation, she suddenly felt is coming on lightheaded, transient blackout, was due to hypotension per her husband  Since her stroke, she also had memory trouble, gait difficulty, slowly improving   UPDATE July 12 2015: EEG in June 16 2015:  there was frequent bihemisphere spike slow waves, longest last about 10-15 seconds, consistent with subclinical seizure, generalized discharge was often preceded by spike slow waves at F7, with quick generalization.  She is now taking keppra  750mg  bid, she has no LOC, she is now back on Ritalin 5mg  bid,  She is much less confused, more engaged. 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 alendronate (FOSAMAX) 70 MG tablet Take 1 tablet (70 mg total) by mouth every 7 (seven) days. 12 tablet 3  . Calcium Carb-Cholecalciferol (CALCIUM 600/VITAMIN D3) 600-800 MG-UNIT TABS Take 1 tablet by mouth 2 (two) times daily with a meal.    . FLUoxetine (PROZAC) 10 MG capsule Take 1 capsule (10 mg total) by mouth daily. For depression/anxiety 30 capsule 1  . lisinopril (PRINIVIL,ZESTRIL) 2.5 MG tablet Take 1 tablet (2.5 mg total) by mouth daily. 30 tablet 1  . metFORMIN (GLUCOPHAGE) 500 MG tablet Take one half tablet BID 30 tablet 5  . methylphenidate (RITALIN) 5 MG tablet Take 1 tablet (5 mg total) by mouth 2 (two) times daily with breakfast and lunch. Do not refill till 05/19/15 60 tablet 0  . metoprolol (LOPRESSOR) 50 MG tablet Take 1 tablet (50 mg total) by mouth 2 (two) times daily. 60 tablet 1  . Multiple Vitamin (MULTIVITAMIN WITH MINERALS) TABS Take 1 tablet by mouth daily.    Marland Kitchen NIFEdipine (PROCARDIA XL/ADALAT-CC) 60 MG 24 hr tablet Take 1 tablet (60 mg total) by mouth daily. DO NOT CRUSH 30 tablet 1  . pravastatin (PRAVACHOL) 40 MG tablet Take 1 tablet (40 mg total) by mouth daily. 30 tablet 1  . raloxifene (EVISTA) 60 MG tablet Take 1 tablet (60 mg total) by mouth daily. 90 tablet 3  . senna-docusate (SENOKOT-S) 8.6-50 MG per tablet Take 2 tablets by mouth 2 (two) times daily. For constipation 100 tablet 1  . warfarin (COUMADIN) 2 MG tablet Take 1/2 a pill on Friday evening and take a whole pill on all other days. 30 tablet 1     PAST MEDICAL HISTORY: Past Medical History  Diagnosis Date  . Essential hypertension, benign   . Type 2 diabetes mellitus   . Osteoporosis 06/2008  . Stroke     PAST SURGICAL HISTORY: Past  Surgical History  Procedure Laterality Date  . Ankle surgery Left 2004    otif  . Abdominal hysterectomy    . Anterior and posterior repair N/A 03/11/2013    Procedure: ANTERIOR (CYSTOCELE) AND POSTERIOR REPAIR (RECTOCELE);  Surgeon: Linda Hedges, DO;  Location: [facility];  Service: Gynecology;  Laterality: N/A;  with sacrospinous ligament fixation bilateral  . Colonoscopy  2009    negative  . Cataract extraction w/phaco Right 12/27/2014    Procedure: CATARACT EXTRACTION PHACO AND INTRAOCULAR LENS PLACEMENT RIGHT EYE;  Surgeon: Tonny Branch, MD;  Location: [facility];  Service: Ophthalmology;  Laterality: Right;  CDE:10.63  . Cataract extraction w/phaco Left 01/13/2015    Procedure: CATARACT EXTRACTION PHACO AND INTRAOCULAR LENS PLACEMENT LEFT EYE;  Surgeon: Tonny Branch, MD;  Location: [facility];  Service: Ophthalmology;  Laterality: Left;  CDE:18.87  . Cholecystectomy    . Tee without cardioversion N/A 04/01/2015    Procedure: TRANSESOPHAGEAL ECHOCARDIOGRAM (TEE);  Surgeon: Sanda Klein, MD;  Location: [facility];  Service: Cardiovascular;  Laterality: N/A;    FAMILY HISTORY: Family History  Problem Relation Age of  Onset  . Cancer Father     Liver  . Cancer Sister     Breast  . Diabetes Brother   . Dementia Mother     SOCIAL HISTORY:  History   Social History  . Marital Status: Married    Spouse Name: N/A  . Number of Children: 3  . Years of Education: 12   Occupational History  . Retired    Social History Main Topics  . Smoking status: Former Smoker -- 0.25 packs/day for 30 years    Types: Cigarettes    Quit date: 02/27/2015  . Smokeless tobacco: Not on file  . Alcohol Use: No  . Drug Use: No  . Sexual Activity: Yes    Birth Control/ Protection: Surgical   Other Topics Concern  . Not on file   Social History Narrative   Lives at home with her husband.   Right-handed.   2 cups caffeine daily.     PHYSICAL EXAM   Filed Vitals:   07/12/15 1534  BP: 151/59  Pulse:  58  Height: 5\' 1"  (1.549 m)  Weight: 92 lb (41.731 kg)    Not recorded      Body mass index is 17.39 kg/(m^2).  PHYSICAL EXAMNIATION:  Gen: NAD, conversant, well nourised, obese, well groomed                     Cardiovascular: Regular rate rhythm, no peripheral edema, warm, nontender. Eyes: Conjunctivae clear without exudates or hemorrhage Neck: Supple, no carotid bruise. Pulmonary: Clear to auscultation bilaterally   NEUROLOGICAL EXAM:  MENTAL STATUS: Speech:    Speech is slow, and soft,   Cognition:  MMSE 29/30    The patient is oriented to person, place, and time;     Missed 1/3 recall.     language fluent;     normal attention, concentration,     fund of knowledge.  CRANIAL NERVES: CN II: Visual fields are full to confrontation. Fundoscopic sharp disks, pupil equal round reactive to light. CN III, IV, VI: extraocular movement are normal. No ptosis. CN V: Facial sensation is intact to pinprick in all 3 divisions bilaterally. Corneal responses are intact.  CN VII: Face is symmetric with normal eye closure and smile. CN VIII: Hearing is normal to rubbing fingers CN IX, X: Palate elevates symmetrically. Phonation is normal. CN XI: Decreased right shoulder shrugging. CN XII: Tongue is midline with normal movements and no atrophy.  MOTOR: She has right upper extremity pronation drift, mild right upper and lower extremity proximal and distal weakness 4/5, mild right hip flexion weakness, right ankle tends to stay in mild ankle plantar flexion.   REFLEXES: Reflexes are hyperreflexia on the right upper and lower extremity, right plantar responses was extensor  SENSORY: Intact to light touch.  COORDINATION: Rapid alternating movements and fine finger movements are intact. There is no dysmetria on finger-to-nose and heel-knee-shin. There are no abnormal or extraneous movements.   GAIT/STANCE: Mildly unsteady, dragging her right foot across the floor, holding her left  arm in right shoulder abduction, mild elbow flexion pronation  DIAGNOSTIC DATA (LABS, IMAGING, TESTING) - I reviewed patient records, labs, notes, testing and imaging myself where available.  Lab Results  Component Value Date   WBC 8.7 04/18/2015   HGB 11.7* 04/18/2015   HCT 35.4* 04/18/2015   MCV 81.0 04/18/2015   PLT 237 04/18/2015      Component Value Date/Time   NA 139 04/18/2015 0440   NA  142 02/21/2015 0810   K 4.1 04/18/2015 0440   CL 103 04/18/2015 0440   CO2 27 04/18/2015 0440   GLUCOSE 119* 04/18/2015 0440   GLUCOSE 130* 02/21/2015 0810   BUN 9 04/18/2015 0440   BUN 16 02/21/2015 0810   CREATININE 0.70 06/27/2015 1508   CREATININE 0.66 09/06/2014 0925   CALCIUM 9.2 04/18/2015 0440   PROT 6.9 04/02/2015 1810   ALBUMIN 3.5 04/02/2015 1810   AST 24 04/02/2015 1810   ALT 21 04/02/2015 1810   ALKPHOS 38* 04/02/2015 1810   BILITOT 0.5 04/02/2015 1810   GFRNONAA >60 04/18/2015 0440   GFRAA >60 04/18/2015 0440   Lab Results  Component Value Date   CHOL 166 03/31/2015   HDL 41 03/31/2015   LDLCALC 91 03/31/2015   TRIG 170* 03/31/2015   CHOLHDL 4.0 03/31/2015   Lab Results  Component Value Date   HGBA1C 6.9* 03/31/2015   ASSESSMENT AND PLAN  EVETT KASSA is a 74 y.o. female  with vascular risk factor of hypertension, diabetes, hyperlipidemia, presented with left subcortical stroke, with mild residual right-sided weakness.  1, Stroke, is on Coumadin because of thrombosis at descending thoracic aorta, not a surgical candidate for left carotid artery endarterectomy, with left internal carotid artery less than 50% stenosis, 2, continue address vascular risk factor, goal blood pressure less than 130 out of 80, LDL less than 70 3.  Confusion, subclinical seizure, improved with Keppra 750 mg twice a day, but not back to her baseline yet we will repeat EEG, keep current dose of Keppra 4.  Left parotid duct mass, ENT consult pending 5, return to clinic in 2-3 months      [name], M.D. Ph.D.  [facility] [address], [place], [place] [address] Ph: [phone] Fax: [phone]

## 2015-07-12 NOTE — Telephone Encounter (Signed)
I have now received Dr Benjamine Mola report. Pt has appt Aug 8 BUT we can see pt next week if they would like to move it ( as in move appt to T or Weds) to discuss her issue and do check up. Explain to spouse I will be gone [place] this week.

## 2015-07-12 NOTE — Telephone Encounter (Signed)
These are currently under dr Naaman Plummer

## 2015-07-12 NOTE — Telephone Encounter (Signed)
Left message to return call 

## 2015-07-12 NOTE — Telephone Encounter (Signed)
lisinopril (PRINIVIL,ZESTRIL) 2.5 MG tablet  Not two coumadins please add this to the list

## 2015-07-12 NOTE — Telephone Encounter (Signed)
May send in 6 months regarding meds, make sure pt knows to follow dosing of coumadin based on our recommendations from her INR

## 2015-07-12 NOTE — Telephone Encounter (Signed)
Rxs sent electronically to pharmacy. Patient notified. 

## 2015-07-13 ENCOUNTER — Other Ambulatory Visit: Payer: Self-pay | Admitting: Physical Medicine and Rehabilitation

## 2015-07-13 NOTE — Telephone Encounter (Signed)
Discussed with husband. He did want to change appt. Transferred to front to change appt from aug 8th to next tues or wedn

## 2015-07-18 ENCOUNTER — Ambulatory Visit (INDEPENDENT_AMBULATORY_CARE_PROVIDER_SITE_OTHER): Payer: Medicare HMO | Admitting: Neurology

## 2015-07-18 DIAGNOSIS — I639 Cerebral infarction, unspecified: Secondary | ICD-10-CM

## 2015-07-18 DIAGNOSIS — R569 Unspecified convulsions: Secondary | ICD-10-CM | POA: Diagnosis not present

## 2015-07-18 DIAGNOSIS — R269 Unspecified abnormalities of gait and mobility: Secondary | ICD-10-CM

## 2015-07-20 ENCOUNTER — Ambulatory Visit (INDEPENDENT_AMBULATORY_CARE_PROVIDER_SITE_OTHER): Payer: Medicare HMO | Admitting: Family Medicine

## 2015-07-20 ENCOUNTER — Encounter: Payer: Self-pay | Admitting: Family Medicine

## 2015-07-20 VITALS — BP 136/72 | Ht 61.0 in | Wt 93.4 lb

## 2015-07-20 DIAGNOSIS — Z7901 Long term (current) use of anticoagulants: Secondary | ICD-10-CM

## 2015-07-20 DIAGNOSIS — E119 Type 2 diabetes mellitus without complications: Secondary | ICD-10-CM | POA: Diagnosis not present

## 2015-07-20 DIAGNOSIS — R358 Other polyuria: Secondary | ICD-10-CM | POA: Diagnosis not present

## 2015-07-20 DIAGNOSIS — D379 Neoplasm of uncertain behavior of digestive organ, unspecified: Secondary | ICD-10-CM | POA: Diagnosis not present

## 2015-07-20 DIAGNOSIS — R3589 Other polyuria: Secondary | ICD-10-CM

## 2015-07-20 DIAGNOSIS — D490 Neoplasm of unspecified behavior of digestive system: Secondary | ICD-10-CM

## 2015-07-20 DIAGNOSIS — I63412 Cerebral infarction due to embolism of left middle cerebral artery: Secondary | ICD-10-CM

## 2015-07-20 DIAGNOSIS — I10 Essential (primary) hypertension: Secondary | ICD-10-CM

## 2015-07-20 LAB — POCT URINALYSIS DIPSTICK
Nitrite, UA: NEGATIVE
PH UA: 5
Spec Grav, UA: 1.02

## 2015-07-20 MED ORDER — METHYLPHENIDATE HCL 5 MG PO TABS: 5.0000 mg | ORAL_TABLET | Freq: Two times a day (BID) | ORAL | Status: DC

## 2015-07-20 MED ORDER — NITROFURANTOIN MONOHYD MACRO 100 MG PO CAPS: 100.0000 mg | ORAL_CAPSULE | Freq: Two times a day (BID) | ORAL | Status: DC

## 2015-07-20 MED ORDER — PRAVASTATIN SODIUM 40 MG PO TABS: 40.0000 mg | ORAL_TABLET | Freq: Every day | ORAL | Status: DC

## 2015-07-20 NOTE — Progress Notes (Signed)
   Subjective:    Patient ID: Laura Melendez, female    DOB: [birth year], 74 y.o.   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 Patient also states that she needs refills on pravastatin 40mg .  The area on the side of the face is getting larger she had a CAT scan which showed tumor local ENT recommended referral to [facility]. Family was uncertain but now they are agreeing to this.  She is trying to eat and take her medicines appropriately family does the best they can for her.  This patient does have thrombus in the aorta for which Coumadin long-term is recommended. Also patient with urinary symptoms of strong odor with the urine.  Review of Systems The patient denies any severe head pain she denies nausea vomiting denies chest tightness pressure or shortness of breath she does relate weakness and difficulty getting around because of the weakness this weakness stems from her stroke.    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  Reason stroke-patient needs significant rehabilitation I recommend to restart this.  Hyperlipidemia-refills on medication given  Urinalysis shows UTI go ahead and treat with antibiotics will check INR next week  Aortic thrombus continue Coumadin indefinitely in 6 months we would plan to reevaluate this via cardiology/vascular surgery  Stroke related dysphoria and low inertia-specialist started methylphenidate 5 mg  twice a day we went ahead and gave her 3 prescriptions for this  No stroke related issues  Prediabetes issue under good control  Hypertension under good control  Follow-up 2-3 months.

## 2015-07-22 LAB — URINE CULTURE

## 2015-07-23 ENCOUNTER — Other Ambulatory Visit: Payer: Self-pay | Admitting: Physical Medicine and Rehabilitation

## 2015-07-25 ENCOUNTER — Ambulatory Visit: Payer: Medicare HMO | Admitting: Family Medicine

## 2015-07-29 ENCOUNTER — Ambulatory Visit (INDEPENDENT_AMBULATORY_CARE_PROVIDER_SITE_OTHER): Payer: Medicare HMO | Admitting: *Deleted

## 2015-07-29 DIAGNOSIS — Z7901 Long term (current) use of anticoagulants: Secondary | ICD-10-CM | POA: Diagnosis not present

## 2015-07-29 LAB — POCT INR: INR: 2.1

## 2015-07-29 NOTE — Patient Instructions (Signed)
Continue coumadin dose the same. 2mg  tablets. One every day except on tues, thurs, and sat take one half tablet. Recheck inr in 4 weeks.

## 2015-08-01 ENCOUNTER — Other Ambulatory Visit: Payer: Self-pay | Admitting: Neurology

## 2015-08-01 ENCOUNTER — Telehealth: Payer: Self-pay | Admitting: Neurology

## 2015-08-01 MED ORDER — LEVETIRACETAM 750 MG PO TABS: ORAL_TABLET | ORAL | Status: DC

## 2015-08-01 NOTE — Telephone Encounter (Signed)
I have called her husband, repeat EEG still showed evidence of epileptiform discharge, no clinical seizure activities noticed.  I will increase her Keppra to 750 mg one tablet in the morning, 2 tablets every night.  New prescription was called in

## 2015-08-01 NOTE — Procedures (Signed)
   HISTORY: 74 years old female, with history of confusion, subclinical seizure-like event in the past, is taking Keppra, remained confused  TECHNIQUE:  16 channel EEG was performed based on standard 10-16 international system. One channel was dedicated to EKG, which has demonstrates normal sinus rhythm.  Upon awakening, the posterior background activity was mildly dysarrythmic, she has frequent short lasting bilateral frontal predominant spike slow activities,  There was also frequent prolonged spike slow waves, longest one lasted up to 30 seconds, there was no clinical seizure activity documented  Photic stimulation was not performed  Hyperventilation was not performed, there was no abnormality elicit.  No sleep was achieved.  CONCLUSION: This is a  an abnormal EEG.  There is electrodiagnostic evidence of generalized spike slow discharges, consistent with subclinical seizures.

## 2015-08-03 ENCOUNTER — Telehealth: Payer: Self-pay | Admitting: Family Medicine

## 2015-08-03 NOTE — Telephone Encounter (Signed)
I have already spoken with Dr Benjamine Mola, his office was arranging for this referral and was to call the patient with that thank you

## 2015-08-03 NOTE — Telephone Encounter (Signed)
Holding referral to ENT at [facility] per your note of speaking to Dr. Benjamine Mola to see who he recommends.  Should I go ahead and refer?  Please advise

## 2015-08-17 ENCOUNTER — Encounter: Payer: Medicare HMO | Attending: Physical Medicine & Rehabilitation | Admitting: Physical Medicine & Rehabilitation

## 2015-08-17 ENCOUNTER — Encounter: Payer: Self-pay | Admitting: Physical Medicine & Rehabilitation

## 2015-08-17 VITALS — BP 158/98 | HR 50 | Resp 14

## 2015-08-17 DIAGNOSIS — I6350 Cerebral infarction due to unspecified occlusion or stenosis of unspecified cerebral artery: Secondary | ICD-10-CM | POA: Diagnosis not present

## 2015-08-17 DIAGNOSIS — G8191 Hemiplegia, unspecified affecting right dominant side: Secondary | ICD-10-CM

## 2015-08-17 DIAGNOSIS — F329 Major depressive disorder, single episode, unspecified: Secondary | ICD-10-CM | POA: Diagnosis not present

## 2015-08-17 DIAGNOSIS — G8190 Hemiplegia, unspecified affecting unspecified side: Secondary | ICD-10-CM | POA: Insufficient documentation

## 2015-08-17 DIAGNOSIS — I63412 Cerebral infarction due to embolism of left middle cerebral artery: Secondary | ICD-10-CM | POA: Diagnosis not present

## 2015-08-17 NOTE — Patient Instructions (Signed)
THINK ABOUT SAFETY WHEN YOU WALK AND ATTEMPT TO SIT DOWN!!!

## 2015-08-17 NOTE — Progress Notes (Signed)
Subjective:    Patient ID: Laura Melendez, female    DOB: Aug 26, 1941, 74 y.o.   MRN: [id number]  HPI  Laura Melendez is here in follow up of her left frontal infarct. 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 The lesion was biopsied last week.  She has fallen frequently at home according to husband. She tends to sit on the edge of the bed or chair which lends itself to her slipping off the surface to the floor.     Pain Inventory Average Pain 0 Pain Right Now 0 My pain is no pain  In the last 24 hours, has pain interfered with the following? General activity 0 Relation with others 0 Enjoyment of life 2 What TIME of day is your pain at its worst? daytime Sleep (in general) Good  Pain is worse with: some activites Pain improves with: no pain Relief from Meds: no pain  Mobility walk with assistance use a [place] ability to climb steps?  yes do you drive?  no needs help with transfers  Function retired  Neuro/Psych bladder control problems bowel control problems weakness trouble walking  Prior Studies Any changes since last visit?  no  Physicians involved in your care Any changes since last visit?  no   Family History  Problem Relation Age of Onset  . Cancer Father     Liver  . Cancer Sister     Breast  . Diabetes Brother   . Dementia Mother    Social History   Social History  . Marital Status: Married    Spouse Name: N/A  . Number of Children: 3  . Years of Education: 12   Occupational History  . Retired    Social History Main Topics  . Smoking status: Former Smoker -- 0.25 packs/day for 30 years    Types: Cigarettes    Quit date: 02/27/2015  . Smokeless tobacco: None  . Alcohol Use: No  . Drug Use: No  . Sexual Activity: Yes    Birth Control/ Protection: Surgical   Other Topics Concern  . None   Social History Narrative   Lives at home with her husband.   Right-handed.   2 cups  caffeine daily.   Past Surgical History  Procedure Laterality Date  . Ankle surgery Left 2004    otif  . Abdominal hysterectomy    . Anterior and posterior repair N/A 03/11/2013    Procedure: ANTERIOR (CYSTOCELE) AND POSTERIOR REPAIR (RECTOCELE);  Surgeon: Linda Hedges, DO;  Location: [facility];  Service: Gynecology;  Laterality: N/A;  with sacrospinous ligament fixation bilateral  . Colonoscopy  2009    negative  . Cataract extraction w/phaco Right 12/27/2014    Procedure: CATARACT EXTRACTION PHACO AND INTRAOCULAR LENS PLACEMENT RIGHT EYE;  Surgeon: Tonny Branch, MD;  Location: [facility];  Service: Ophthalmology;  Laterality: Right;  CDE:10.63  . Cataract extraction w/phaco Left 01/13/2015    Procedure: CATARACT EXTRACTION PHACO AND INTRAOCULAR LENS PLACEMENT LEFT EYE;  Surgeon: Tonny Branch, MD;  Location: [facility];  Service: Ophthalmology;  Laterality: Left;  CDE:18.87  . Cholecystectomy    . Tee without cardioversion N/A 04/01/2015    Procedure: TRANSESOPHAGEAL ECHOCARDIOGRAM (TEE);  Surgeon: Sanda Klein, MD;  Location: [facility];  Service: Cardiovascular;  Laterality: N/A;   Past Medical History  Diagnosis Date  . Essential hypertension, benign   . Type 2 diabetes mellitus   . Osteoporosis 06/2008  . Stroke  BP 158/98 mmHg  Pulse 50  Resp 14  SpO2 97%  Opioid Risk Score:   Fall Risk Score:  `1  Depression screen PHQ 2/9  Depression screen [facility] 2/9 06/15/2015 09/28/2014 03/23/2014  Decreased Interest 2 0 0  Down, Depressed, Hopeless 2 0 0  PHQ - 2 Score 4 0 0  Altered sleeping 0 - -  Tired, decreased energy 1 - -  Change in appetite 0 - -  Feeling bad or failure about yourself  0 - -  Trouble concentrating 0 - -  Moving slowly or fidgety/restless 2 - -  Suicidal thoughts 0 - -  PHQ-9 Score 7 - -     Review of Systems  Musculoskeletal: Positive for gait problem.  Neurological: Positive for weakness.       Objective:   Physical Exam  Constitutional: She appears  alert, no distress.  HENT: mild coating of thrush on the tongue  Head: Normocephalic and atraumatic.  Eyes: Conjunctivae are normal. Pupils are equal, round, and reactive to light.  Neck: Normal range of motion. Neck supple.  Cardiovascular: Normal rate and regular rhythm. no murmur  Respiratory: Effort normal and breath sounds normal. No respiratory distress. She has no wheezes.  GI: Soft. Bowel sounds are normal.  Musculoskeletal: She exhibits no edema or tenderness.  Neurological: She is alert. RUE: Language and cognition much improved. Initiates more. Able to converse. Has reasonable insight and awareness . RUE with spontaneous movement/voluntary effort now---MMT 4- 4/5 RUE. RLE:   4-/5. + apraxia. T  DTR's 3+. Right central 7 and tongue deviation. senses pain /LT right arm and leg. Tends to walk with a slight drag of right foot and now left foot, does better when reminded or cued. Drifts to the right with gait. Left foot rotated externally. M/S: no pain with range of motion right upper extremity  Psych: very flat  Skin: Skin is warm and dry. No breakdown   Assessment/Plan:  1. 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 Thoracic aortic and supraceliac aortic thrombus/Anticoagulation: Coumadin.  -To follow up with Dr. Bridgett Larsson in 6 month for repeat CTA chest/abd/pelvis.  3. Parotid tumor: surgical follow up next month..  4. Reactive depression: continue with prozac  -continue ritalin as a stimulant to boost initiation and mood    Fifteen minutes of face to face patient care time were spent during this visit. All questions were encouraged and answered. Follow up with me in three months

## 2015-08-19 ENCOUNTER — Telehealth: Payer: Self-pay | Admitting: *Deleted

## 2015-08-19 ENCOUNTER — Other Ambulatory Visit: Payer: Self-pay | Admitting: Physical Medicine and Rehabilitation

## 2015-08-19 NOTE — Telephone Encounter (Signed)
-----   Message from Georgiann Mccoy sent at 08/17/2015 12:24 PM EDT ----- Regarding: info for [facility] Pt's husband walked in, pt saw Dr. Francina Ames [phone]) on 08/16/15. She had a mass on the L side of her face.  Dr. Nicolette Bang wanted Dr. Bridgett Larsson informed that the pt may have to be taken off of coumadin for possible surgery.  Thank you, Selinda Eon

## 2015-08-19 NOTE — Telephone Encounter (Signed)
Spoke with Dr. Bridgett Larsson and he has authorized patient to be off her Coumadin for 5 days prior to planned surgery by Dr. Nicolette Bang. I called Dr. Servando Salina office [phone]) and talked to his triage nurse, Shirlean Mylar; I gave her this information and she said she would staff message Dr. Nicolette Bang. I also called Mr. Alamillo and told him what Dr. Bridgett Larsson had said. He voice understanding and agreement with this plan.

## 2015-08-20 ENCOUNTER — Other Ambulatory Visit: Payer: Self-pay | Admitting: Physical Medicine and Rehabilitation

## 2015-08-23 ENCOUNTER — Ambulatory Visit (HOSPITAL_COMMUNITY): Payer: Medicare HMO | Attending: Physical Medicine & Rehabilitation | Admitting: Physical Therapy

## 2015-08-23 DIAGNOSIS — I63412 Cerebral infarction due to embolism of left middle cerebral artery: Secondary | ICD-10-CM | POA: Diagnosis not present

## 2015-08-23 DIAGNOSIS — R262 Difficulty in walking, not elsewhere classified: Secondary | ICD-10-CM | POA: Diagnosis present

## 2015-08-23 DIAGNOSIS — R2681 Unsteadiness on feet: Secondary | ICD-10-CM | POA: Diagnosis present

## 2015-08-23 DIAGNOSIS — R531 Weakness: Secondary | ICD-10-CM | POA: Diagnosis present

## 2015-08-23 DIAGNOSIS — Z7409 Other reduced mobility: Secondary | ICD-10-CM

## 2015-08-23 DIAGNOSIS — R296 Repeated falls: Secondary | ICD-10-CM | POA: Insufficient documentation

## 2015-08-23 DIAGNOSIS — Z9181 History of falling: Secondary | ICD-10-CM

## 2015-08-23 NOTE — Therapy (Signed)
[place], [place], [address] Phone: [phone]   Fax:  [phone]  Physical Therapy Evaluation  Patient Details  Name: Laura Melendez MRN: [id number] Date of Birth: [birth year] Referring Provider:  Meredith Staggers, MD  Encounter Date: 08/23/2015      PT End of Session - 08/23/15 1738    Visit Number 1   Number of Visits 20   Date for PT Re-Evaluation 09/20/15   Authorization Type [employer] PPO    Authorization Time Period 08/23/15 to 10/23/15   Authorization - Visit Number 1   Authorization - Number of Visits 10   PT Start Time 3159   PT Stop Time 1608   PT Time Calculation (min) 45 min   Activity Tolerance Patient tolerated treatment well;Patient limited by fatigue   Behavior During Therapy [facility] for tasks assessed/performed;Flat affect      Past Medical History  Diagnosis Date  . Essential hypertension, benign   . Type 2 diabetes mellitus   . Osteoporosis 06/2008  . Stroke     Past Surgical History  Procedure Laterality Date  . Ankle surgery Left 2004    otif  . Abdominal hysterectomy    . Anterior and posterior repair N/A 03/11/2013    Procedure: ANTERIOR (CYSTOCELE) AND POSTERIOR REPAIR (RECTOCELE);  Surgeon: Linda Hedges, DO;  Location: [facility];  Service: Gynecology;  Laterality: N/A;  with sacrospinous ligament fixation bilateral  . Colonoscopy  2009    negative  . Cataract extraction w/phaco Right 12/27/2014    Procedure: CATARACT EXTRACTION PHACO AND INTRAOCULAR LENS PLACEMENT RIGHT EYE;  Surgeon: Tonny Branch, MD;  Location: [facility];  Service: Ophthalmology;  Laterality: Right;  CDE:10.63  . Cataract extraction w/phaco Left 01/13/2015    Procedure: CATARACT EXTRACTION PHACO AND INTRAOCULAR LENS PLACEMENT LEFT EYE;  Surgeon: Tonny Branch, MD;  Location: [facility];  Service: Ophthalmology;  Laterality: Left;  CDE:18.87  . Cholecystectomy    . Tee without cardioversion N/A 04/01/2015    Procedure:  TRANSESOPHAGEAL ECHOCARDIOGRAM (TEE);  Surgeon: Sanda Klein, MD;  Location: [facility];  Service: Cardiovascular;  Laterality: N/A;    There were no vitals filed for this visit.  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 Recent falls. Patient reports she has no pain. Patient also reports that doing things like getting things off of floor, getting clothes off of drier is difficult, feeding the dog is hard.    Pertinent History Stroke happened in the middle of April; L MCA with R hemiparesis. Per husband, patient did receive inpatient rehab as well as HHPT, which has been discharged. Husband reports that there have been recent falls; she has hard days and better days. Husband reports that MD wants to prioritize PT right now, not so worried about OT.    Patient Stated Goals be able to take care of house; husband would like her to be able to dress and cook meals, get stronger  Currently in Pain? No/denies            [facility] PT Assessment - 08/23/15 0001    Assessment   Medical Diagnosis CVA (L MCA)   Onset Date/Surgical Date --  April 2016   Next MD Visit [place] this week; Naaman Plummer in November 2016   Precautions   Precautions Other (comment);Fall   Precaution Comments on Coumadin, high fall risk    Restrictions   Weight Bearing Restrictions No   Balance Screen   Has the patient fallen in the past 6 months Yes   How many times? 5-6   Has the patient had a decrease in activity  level because of a fear of falling?  Yes   Is the patient reluctant to leave their home because of a fear of falling?  Yes   Prior Function   Level of Independence Independent;Independent with basic ADLs;Independent with gait;Independent with transfers   Vocation Retired   Observation/Other Assessments   Observations appears to lack safety awareness as well as possible visio-perceptual impairment during moibility    Focus on Therapeutic Outcomes (FOTO)  88% limited    Posture/Postural Control   Posture Comments forward head with B IR shoulders, narrow BOS, R foot drop    Strength   Right Hip Extension 2/5   Right Hip ABduction 2/5   Left Hip Extension 2/5   Left Hip ABduction 2/5   Right Knee Flexion 2/5   Right Knee Extension 2+/5   Left Knee Flexion 3+/5   Left Knee Extension 4-/5   Right Ankle Dorsiflexion 3-/5   Left Ankle Dorsiflexion 4+/5   Bed Mobility   Rolling Right 4: Min guard   Rolling Left 4: Min guard   Supine to Sit 4: Min assist   Supine to Sit Details (indicate cue type and reason) assist for sequencing    Sit to Supine 4: Min guard   Transfers   Sit to Stand 4: Min guard;4: Min assist   Sit to Stand Details (indicate cue type and reason) Min(A) from low surface    Stand to Sit 4: Min guard   Ambulation/Gait   Gait Comments reduced BOS, R foot drag/drop, poor foot clearance, anterior LOB during gait due to poor control of COM, poor safety awareness, general unsteadiness, poor hip/knee flexion R LE     6 minute walk test results    Endurance additional comments 3 minute walk test 387ft, no device but with contact guard  no rest breaks    Timed Up and Go Test   TUG Comments 17.9, 17.9, 18.9                           PT Education - 08/23/15 1737    Education provided Yes   Education Details prognosis, plan of care, HEP    Person(s) Educated Patient;Spouse   Methods Explanation;Handout   Comprehension Verbalized understanding;Returned  demonstration;Need further instruction          PT Short Term Goals - 08/23/15 1743    PT SHORT TERM GOAL #1   Title Patient will be able to perform functional bed mobility tasks, including supine<->sit and rolling with distant supervision, occasional cues for safety/sequencing    Time 4   Period Weeks   Status New   PT SHORT TERM GOAL #2   Title Patient will be able to perform sit to stand from low surfaces with distance supervision and only occasional cues for safety/sequencing  Time 4   Period Weeks   Status New   PT SHORT TERM GOAL #3   Title Patient will be able to perform TUG in 13 seconds or less on a consistent basis    Time 4   Period Weeks   Status New   PT SHORT TERM GOAL #4   Title Patient will be able to ambulate at least 522ft during 3 minute walk test with only minimal unsteadiness and good balance self-correction strategies    Time 4   Period Weeks   Status New   PT SHORT TERM GOAL #5   Title Patient and her spouse will be independent in correctly and consistently performing appropriate HEP, to be updated PRN    Time 4   Period Weeks   Status New           PT Long Term Goals - 08/23/15 1746    PT LONG TERM GOAL #1   Title Patient will demonstrate at least 4+/5 strength in bilateral lower extremities and at least 3+/5 strength in proximal musculature    Time 8   Period Weeks   Status New   PT LONG TERM GOAL #2   Title Patient will demonstrate good safety awareness at least 80% of the time when navigating through obstacles and in functional scenarios, to be evidenced by performance of functional obstacle course    Time 8   Period Weeks   Status New   PT LONG TERM GOAL #3   Title Patient and her spouse will report no falls within the past 4 weeks, and patient willl also demonstrate a score of no more than 11 on TUG    Time 8   Period Weeks   Status New   PT LONG TERM GOAL #4   Title Patient will be able to ambulate at least 1055ft during 6 minute  walk test with no rest breaks, minimal unsteadiness and good balance self-correction strategies    Time 8   Period Weeks   Status New   PT LONG TERM GOAL #5   Title Patient to be able to maintain SLS on foam pad for at least 20 seconds each lower extremity to evidence improved stability and reduced fall risk    Time 8   Period Weeks   Status New               Plan - 08/23/15 1740    Clinical Impression Statement Patient presents status post CVA with significant impairments in muscle strength, gait and posture, safety awareness, functional activity tolerance, functional task performance, functional mobility, and is a high fall risk at this time. Patient's spouse was present during evaluation and reports that he does quite a bit for her at home- however, with proper cues and sequencing, patient able to complete all mobility during eval with Min(A) at most. At this time patient will benefit from skilled PT services in order to address her functional impairments as well as to assist her in reaching an optimal level of function with minimal fall risk and to reduce burden on care-givers.    Pt will benefit from skilled therapeutic intervention in order to improve on the following deficits Abnormal gait;Decreased endurance;Decreased activity tolerance;Decreased strength;Decreased balance;Decreased mobility;Difficulty walking;Impaired vision/preception;Impaired perceived functional ability;Decreased coordination;Decreased safety awareness;Postural dysfunction   Rehab Potential Good   PT Frequency Other (comment)  3x/week for 4 weeks, then 2x/week    PT Duration Other (comment)  3x/week for 4 weeks, then 2x/week for 4 weeks  PT Treatment/Interventions ADLs/Self Care Home Management;DME Instruction;Gait training;Stair training;Functional mobility training;Therapeutic activities;Therapeutic exercise;Balance training;Neuromuscular re-education;Patient/family education;Manual techniques;Energy  conservation;Visual/perceptual remediation/compensation   PT Next Visit Plan review HEP and goals; functional stretching, strengthening, balance, safety training, functional activity tolerance    PT Home Exercise Plan given    Consulted and Agree with Plan of Care Patient          G-Codes - 2015-09-09 1751    Functional Assessment Tool Used FOTO 88% limited    Functional Limitation Mobility: Walking and moving around   Mobility: Walking and Moving Around Current Status [phone]) At least 80 percent but less than 100 percent impaired, limited or restricted   Mobility: Walking and Moving Around Goal Status [phone]) At least 60 percent but less than 80 percent impaired, limited or restricted       Problem List Patient Active Problem List   Diagnosis Date Noted  . 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 Right hemiparesis 04/04/2015  . 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 Aortic thrombus   . Stroke with cerebral ischemia   . 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 DM (diabetes mellitus) 03/23/2014  . Osteoporosis, unspecified 03/23/2014  . Dyslipidemia 03/23/2014  . Tobacco abuse 03/23/2014  . Syncope 01/01/2014  . Essential hypertension, benign 01/01/2014   Deniece Ree PT, DPT [phone]  [place] [address] [place], [place], [address] Phone: [phone]   Fax:  [phone]

## 2015-08-23 NOTE — Patient Instructions (Signed)
    ANKLE PUMPS - AP  Bend your foot up and down at your ankle joint as shown.  Hold for 2 seconds and then release.  Repeat 20 times, 2-3x/day.    LONG ARC QUAD - LAQ - HIGH SEAT  While seated with your knee in a bent position, slowly straighten your knee as you raise your foot upwards as shown. Hold for 2 seconds, then relax.  Repeat 10 times each side, 2-3x/day.  Functional Quadriceps: Sit to Stand   Sit on edge of chair, feet flat on floor. Stand upright, extending knees fully. Repeat _5-10___ times per set. Do _1___ sets per session. Do _2-3___ sessions per day.  http://orth.exer.us/735   Copyright  VHI. All rights reserved.     STANDING MARCHING  While standing, draw up your knee, set it down and then alternate to your other side.  Use your arms for support if needed for balance and safety.  Repeat 10 times, 2-3x/day.

## 2015-08-24 ENCOUNTER — Ambulatory Visit (HOSPITAL_COMMUNITY): Payer: Medicare HMO

## 2015-08-24 DIAGNOSIS — I63412 Cerebral infarction due to embolism of left middle cerebral artery: Secondary | ICD-10-CM | POA: Diagnosis not present

## 2015-08-24 DIAGNOSIS — R262 Difficulty in walking, not elsewhere classified: Secondary | ICD-10-CM

## 2015-08-24 DIAGNOSIS — Z7409 Other reduced mobility: Secondary | ICD-10-CM

## 2015-08-24 DIAGNOSIS — R531 Weakness: Secondary | ICD-10-CM

## 2015-08-24 DIAGNOSIS — R2681 Unsteadiness on feet: Secondary | ICD-10-CM

## 2015-08-24 DIAGNOSIS — Z9181 History of falling: Secondary | ICD-10-CM

## 2015-08-24 NOTE — Therapy (Signed)
[place], [place], [address] Phone: [phone]   Fax:  [phone]  Physical Therapy Treatment  Patient Details  Name: Laura Melendez MRN: [id number] Date of Birth: 07-Sep-1941 Referring Provider:  Meredith Staggers, MD  Encounter Date: 08/24/2015      PT End of Session - 08/24/15 1103    Visit Number 2   Number of Visits 20   Date for PT Re-Evaluation 09/20/15   Authorization Type [employer] Medicare PPO    Authorization Time Period 08/23/15 to 10/23/15   Authorization - Visit Number 2   Authorization - Number of Visits 10   PT Start Time 1018   PT Stop Time 1103   PT Time Calculation (min) 45 min   Equipment Utilized During Treatment Gait belt   Activity Tolerance Patient limited by fatigue;Patient tolerated treatment well   Behavior During Therapy [facility] for tasks assessed/performed;Flat affect      Past Medical History  Diagnosis Date  . Essential hypertension, benign   . Type 2 diabetes mellitus   . Osteoporosis 06/2008  . Stroke     Past Surgical History  Procedure Laterality Date  . Ankle surgery Left 2004    otif  . Abdominal hysterectomy    . Anterior and posterior repair N/A 03/11/2013    Procedure: ANTERIOR (CYSTOCELE) AND POSTERIOR REPAIR (RECTOCELE);  Surgeon: Linda Hedges, DO;  Location: [facility];  Service: Gynecology;  Laterality: N/A;  with sacrospinous ligament fixation bilateral  . Colonoscopy  2009    negative  . Cataract extraction w/phaco Right 12/27/2014    Procedure: CATARACT EXTRACTION PHACO AND INTRAOCULAR LENS PLACEMENT RIGHT EYE;  Surgeon: Tonny Branch, MD;  Location: [facility];  Service: Ophthalmology;  Laterality: Right;  CDE:10.63  . Cataract extraction w/phaco Left 01/13/2015    Procedure: CATARACT EXTRACTION PHACO AND INTRAOCULAR LENS PLACEMENT LEFT EYE;  Surgeon: Tonny Branch, MD;  Location: [facility];  Service: Ophthalmology;  Laterality: Left;  CDE:18.87  . Cholecystectomy    . Tee  without cardioversion N/A 04/01/2015    Procedure: TRANSESOPHAGEAL ECHOCARDIOGRAM (TEE);  Surgeon: Sanda Klein, MD;  Location: [facility] ENDOS[facility];  Service: Cardiovascular;  Laterality: N/A;    There were no vitals filed for this visit.  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 No/denies                         [facility] Adult PT Treatment/Exercise - 08/24/15 0001    Bed Mobility   Bed Mobility Left Sidelying to Sit   Rolling Right 5: Supervision   Rolling Left 4: Min guard   Exercises   Exercises Knee/Hip   Knee/Hip Exercises: Standing   Heel Raises Both;10 reps   Heel Raises Limitations toe raises   Hip Abduction Both;10 reps   Other Standing Knee Exercises marching 10x   Knee/Hip Exercises: Seated   Long Arc Quad Both;10 reps   Other Seated Knee/Hip Exercises ankle pumps   Sit to Sand 10 reps;without UE support  cueing for forward flexion prior sit to stand   Knee/Hip Exercises: Supine   Bridges 10 reps  Other Supine Knee/Hip Exercises bent knee raise 5x each                PT Education - 08/23/15 1737    Education provided Yes   Education Details prognosis, plan of care, HEP    Person(s) Educated Patient;Spouse   Methods Explanation;Handout   Comprehension Verbalized understanding;Returned demonstration;Need further instruction          PT Short Term Goals - 08/23/15 1743    PT SHORT TERM GOAL #1   Title Patient will be able to perform functional bed mobility tasks, including supine<->sit and rolling with distant supervision, occasional cues for safety/sequencing    Time 4   Period Weeks   Status  New   PT SHORT TERM GOAL #2   Title Patient will be able to perform sit to stand from low surfaces with distance supervision and only occasional cues for safety/sequencing    Time 4   Period Weeks   Status New   PT SHORT TERM GOAL #3   Title Patient will be able to perform TUG in 13 seconds or less on a consistent basis    Time 4   Period Weeks   Status New   PT SHORT TERM GOAL #4   Title Patient will be able to ambulate at least 598ft during 3 minute walk test with only minimal unsteadiness and good balance self-correction strategies    Time 4   Period Weeks   Status New   PT SHORT TERM GOAL #5   Title Patient and her spouse will be independent in correctly and consistently performing appropriate HEP, to be updated PRN    Time 4   Period Weeks   Status New           PT Long Term Goals - 08/23/15 1746    PT LONG TERM GOAL #1   Title Patient will demonstrate at least 4+/5 strength in bilateral lower extremities and at least 3+/5 strength in proximal musculature    Time 8   Period Weeks   Status New   PT LONG TERM GOAL #2   Title Patient will demonstrate good safety awareness at least 80% of the time when navigating through obstacles and in functional scenarios, to be evidenced by performance of functional obstacle course    Time 8   Period Weeks   Status New   PT LONG TERM GOAL #3   Title Patient and her spouse will report no falls within the past 4 weeks, and patient willl also demonstrate a score of no more than 11 on TUG    Time 8   Period Weeks   Status New   PT LONG TERM GOAL #4   Title Patient will be able to ambulate at least 1038ft during 6 minute walk test with no rest breaks, minimal unsteadiness and good balance self-correction strategies    Time 8   Period Weeks   Status New   PT LONG TERM GOAL #5   Title Patient to be able to maintain SLS on foam pad for at least 20 seconds each lower extremity to evidence improved stability and reduced fall risk    Time  8   Period Weeks   Status New               Plan - 08/24/15 1104    Clinical Impression Statement Reviewed goals, educated techniques wtih HEP to assure correct technique with pt and husband and copy of evaluation given.  Reviewed form  and technique with bed mobilty and encouraged more independence and less assistance from husband, min assistance required with sidelying to sitting due to weakness.  Pt with flat affect and required multimodal cueing to complete tasks correctly through session.  No reports of pain through session, was limiited by fatigue end of session.   PT Next Visit Plan Review bed mobilty, functional stretching, strengthening, balance, safety training, functional activity tolerance        Problem List Patient Active Problem List   Diagnosis Date Noted  . 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 Right hemiparesis 04/04/2015  . 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 Aortic thrombus   . Stroke with cerebral ischemia   . 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 DM (diabetes mellitus) 03/23/2014  . Osteoporosis, unspecified 03/23/2014  . Dyslipidemia 03/23/2014  . Tobacco abuse 03/23/2014  . Syncope 01/01/2014  . Essential hypertension, benign 01/01/2014   Ihor Austin, [place]; [place]  Aldona Lento 08/24/2015, 11:16 AM  [place], [place], [address] Phone: [phone]   Fax:  [phone]

## 2015-08-26 ENCOUNTER — Other Ambulatory Visit: Payer: Self-pay | Admitting: *Deleted

## 2015-08-26 ENCOUNTER — Ambulatory Visit (INDEPENDENT_AMBULATORY_CARE_PROVIDER_SITE_OTHER): Payer: Medicare HMO | Admitting: *Deleted

## 2015-08-26 ENCOUNTER — Ambulatory Visit (HOSPITAL_COMMUNITY): Payer: Medicare HMO

## 2015-08-26 DIAGNOSIS — R2681 Unsteadiness on feet: Secondary | ICD-10-CM

## 2015-08-26 DIAGNOSIS — R531 Weakness: Secondary | ICD-10-CM

## 2015-08-26 DIAGNOSIS — I63412 Cerebral infarction due to embolism of left middle cerebral artery: Secondary | ICD-10-CM | POA: Diagnosis not present

## 2015-08-26 DIAGNOSIS — Z7901 Long term (current) use of anticoagulants: Secondary | ICD-10-CM

## 2015-08-26 DIAGNOSIS — R262 Difficulty in walking, not elsewhere classified: Secondary | ICD-10-CM

## 2015-08-26 DIAGNOSIS — Z9181 History of falling: Secondary | ICD-10-CM

## 2015-08-26 DIAGNOSIS — Z7409 Other reduced mobility: Secondary | ICD-10-CM

## 2015-08-26 LAB — POCT INR: INR: 2

## 2015-08-26 MED ORDER — METOPROLOL TARTRATE 50 MG PO TABS: 50.0000 mg | ORAL_TABLET | Freq: Two times a day (BID) | ORAL | Status: DC

## 2015-08-26 NOTE — Therapy (Signed)
[place], [place], [address] Phone: [phone]   Fax:  [phone]  Physical Therapy Treatment  Patient Details  Name: Laura Melendez MRN: [id number] Date of Birth: July 17, 1941 Referring Provider:  [name], MD  Encounter Date: 08/26/2015      PT End of Session - 08/26/15 1401    Visit Number 3   Number of Visits 20   Date for PT Re-Evaluation 09/20/15   Authorization Type [employer] Medicare PPO    Authorization Time Period 08/23/15 to 10/23/15   Authorization - Visit Number 3   Authorization - Number of Visits 10   PT Start Time 4944   PT Stop Time 1348   PT Time Calculation (min) 40 min   Activity Tolerance Patient limited by fatigue;Patient tolerated treatment well   Behavior During Therapy [facility] for tasks assessed/performed      Past Medical History  Diagnosis Date  . Essential hypertension, benign   . Type 2 diabetes mellitus   . Osteoporosis 06/2008  . Stroke     Past Surgical History  Procedure Laterality Date  . Ankle surgery Left 2004    otif  . Abdominal hysterectomy    . Anterior and posterior repair N/A 03/11/2013    Procedure: ANTERIOR (CYSTOCELE) AND POSTERIOR REPAIR (RECTOCELE);  Surgeon: Linda Hedges, DO;  Location: [place] [facility];  Service: Gynecology;  Laterality: N/A;  with sacrospinous ligament fixation bilateral  . Colonoscopy  2009    negative  . Cataract extraction w/phaco Right 12/27/2014    Procedure: CATARACT EXTRACTION PHACO AND INTRAOCULAR LENS PLACEMENT RIGHT EYE;  Surgeon: Tonny Branch, MD;  Location: [facility];  Service: Ophthalmology;  Laterality: Right;  CDE:10.63  . Cataract extraction w/phaco Left 01/13/2015    Procedure: CATARACT EXTRACTION PHACO AND INTRAOCULAR LENS PLACEMENT LEFT EYE;  Surgeon: Tonny Branch, MD;  Location: [facility];  Service: Ophthalmology;  Laterality: Left;  CDE:18.87  . Cholecystectomy    . Tee without cardioversion N/A 04/01/2015    Procedure: TRANSESOPHAGEAL  ECHOCARDIOGRAM (TEE);  Surgeon: Sanda Klein, MD;  Location: [facility] END[facility];  Service: Cardiovascular;  Laterality: N/A;    There were no vitals filed for this visit.  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 continues to require assistance getting in and out of bed.   Currently in Pain? No/denies             [facility] Adult PT Treatment/Exercise - 08/26/15 0001    Bed Mobility   Bed Mobility Left Sidelying to Sit   Rolling Right 5: Supervision   Rolling Left 5: Supervision   Left Sidelying to Sit 4: Min guard;5: Supervision  cueing for technique   Left Sidelying to Sit Details (indicate cue type and reason) cueing for technique   Transfers   Sit to Stand 4: Min assist   Sit to Stand Details (indicate cue type and reason) min cueing for form and technique   Stand to Sit 4: Min guard   Ambulation/Gait   Ambulation Distance (Feet) 50 Feet   Assistive device None   Gait Pattern Step-through pattern;Decreased arm swing - right;Decreased arm swing - left;Decreased stride length;Right foot flat;Left foot flat;Lateral trunk lean to right;Trunk flexed  Gait Comments Cueing for heel to toe and increase stride length, pt with tendency to lean Rt   Knee/Hip Exercises: Seated   Sit to Sand 10 reps;without UE support  Cueing for forward flexion prior sit to stand   Knee/Hip Exercises: Supine   Bridges 10 reps   Other Supine Knee/Hip Exercises Cone rotation on supine   Knee/Hip Exercises: Sidelying   Hip ABduction AAROM;Both;5 reps                  PT Short Term Goals - 08/23/15 1743    PT SHORT TERM GOAL #1   Title Patient will be able to perform functional bed mobility tasks, including  supine<->sit and rolling with distant supervision, occasional cues for safety/sequencing    Time 4   Period Weeks   Status New   PT SHORT TERM GOAL #2   Title Patient will be able to perform sit to stand from low surfaces with distance supervision and only occasional cues for safety/sequencing    Time 4   Period Weeks   Status New   PT SHORT TERM GOAL #3   Title Patient will be able to perform TUG in 13 seconds or less on a consistent basis    Time 4   Period Weeks   Status New   PT SHORT TERM GOAL #4   Title Patient will be able to ambulate at least 548ft during 3 minute walk test with only minimal unsteadiness and good balance self-correction strategies    Time 4   Period Weeks   Status New   PT SHORT TERM GOAL #5   Title Patient and her spouse will be independent in correctly and consistently performing appropriate HEP, to be updated PRN    Time 4   Period Weeks   Status New           PT Long Term Goals - 08/23/15 1746    PT LONG TERM GOAL #1   Title Patient will demonstrate at least 4+/5 strength in bilateral lower extremities and at least 3+/5 strength in proximal musculature    Time 8   Period Weeks   Status New   PT LONG TERM GOAL #2   Title Patient will demonstrate good safety awareness at least 80% of the time when navigating through obstacles and in functional scenarios, to be evidenced by performance of functional obstacle course    Time 8   Period Weeks   Status New   PT LONG TERM GOAL #3   Title Patient and her spouse will report no falls within the past 4 weeks, and patient willl also demonstrate a score of no more than 11 on TUG    Time 8   Period Weeks   Status New   PT LONG TERM GOAL #4   Title Patient will be able to ambulate at least 1016ft during 6 minute walk test with no rest breaks, minimal unsteadiness and good balance self-correction strategies    Time 8   Period Weeks   Status New   PT LONG TERM GOAL #5   Title Patient to be able to  maintain SLS on foam pad for at least 20 seconds each lower extremity to evidence improved stability and reduced fall risk    Time 8   Period Weeks   Status New               Plan - 08/26/15 1408    Clinical Impression Statement Session focus on functional strengthening  to promote independence with bed mobilitiy.  Added cone rotation supine to improve crossing midline and core strengthening.  Continued with bridging for gluteal strenghtening to assist with scooting in bed.  Following exercise pt able to demonstrate independently sidelying to sit with no assistance necessary.  Husband reported first time she has been able to complete that without assistance, encouarge husband to reduce assistance with bed mobitly.  Multimodal cueing for proper gait mechanics for heel to toe pattern, equal stride length and to reduce drifting to Rt with gait, min assistance required to reduce risk fof falls.     PT Next Visit Plan Review bed mobilty, functional stretching, strengthening, balance, safety training, functional activity tolerance   Begin gait training next session.          Problem List Patient Active Problem List   Diagnosis Date Noted  . 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 Right hemiparesis 04/04/2015  . 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 Aortic thrombus   . Stroke with cerebral ischemia   . 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 DM (diabetes mellitus) 03/23/2014  . Osteoporosis, unspecified 03/23/2014  . Dyslipidemia 03/23/2014  . Tobacco abuse 03/23/2014  . Syncope 01/01/2014  . Essential hypertension, benign 01/01/2014   Ihor Austin, [place]; [place]  Aldona Lento 08/26/2015, 3:49 PM  [place] [address] [place], [place], [address] Phone: [phone]   Fax:  [phone]

## 2015-08-29 ENCOUNTER — Ambulatory Visit (HOSPITAL_COMMUNITY): Payer: Medicare HMO

## 2015-08-31 ENCOUNTER — Telehealth (HOSPITAL_COMMUNITY): Payer: Self-pay | Admitting: Physical Therapy

## 2015-08-31 ENCOUNTER — Ambulatory Visit (HOSPITAL_COMMUNITY): Payer: Medicare HMO | Admitting: Physical Therapy

## 2015-08-31 DIAGNOSIS — I63412 Cerebral infarction due to embolism of left middle cerebral artery: Secondary | ICD-10-CM

## 2015-08-31 DIAGNOSIS — Z7409 Other reduced mobility: Secondary | ICD-10-CM

## 2015-08-31 DIAGNOSIS — Z9181 History of falling: Secondary | ICD-10-CM

## 2015-08-31 DIAGNOSIS — R531 Weakness: Secondary | ICD-10-CM

## 2015-08-31 DIAGNOSIS — R262 Difficulty in walking, not elsewhere classified: Secondary | ICD-10-CM

## 2015-08-31 DIAGNOSIS — R2681 Unsteadiness on feet: Secondary | ICD-10-CM

## 2015-08-31 NOTE — Therapy (Signed)
[place], [place], [address] Phone: [phone]   Fax:  [phone]  Physical Therapy Treatment  Patient Details  Name: Laura Melendez MRN: [id number] Date of Birth: 1941-07-09 Referring Provider:  Phillips Odor, MD  Encounter Date: 08/31/2015      PT End of Session - 08/31/15 1554    Visit Number 4   Number of Visits 20   Date for PT Re-Evaluation 09/20/15   Authorization Type [employer] Medicare PPO    Authorization Time Period 08/23/15 to 10/23/15   Authorization - Visit Number 4   Authorization - Number of Visits 10   PT Start Time 5732   PT Stop Time 1426   PT Time Calculation (min) 38 min   Equipment Utilized During Treatment Gait belt   Activity Tolerance Patient tolerated treatment well;Patient limited by fatigue   Behavior During Therapy [facility] for tasks assessed/performed  appeared to have some possible anxiety when she was not near husband       Past Medical History  Diagnosis Date  . Essential hypertension, benign   . Type 2 diabetes mellitus   . Osteoporosis 06/2008  . Stroke     Past Surgical History  Procedure Laterality Date  . Ankle surgery Left 2004    otif  . Abdominal hysterectomy    . Anterior and posterior repair N/A 03/11/2013    Procedure: ANTERIOR (CYSTOCELE) AND POSTERIOR REPAIR (RECTOCELE);  Surgeon: Linda Hedges, DO;  Location: [place] [facility];  Service: Gynecology;  Laterality: N/A;  with sacrospinous ligament fixation bilateral  . Colonoscopy  2009    negative  . Cataract extraction w/phaco Right 12/27/2014    Procedure: CATARACT EXTRACTION PHACO AND INTRAOCULAR LENS PLACEMENT RIGHT EYE;  Surgeon: Tonny Branch, MD;  Location: [facility];  Service: Ophthalmology;  Laterality: Right;  CDE:10.63  . Cataract extraction w/phaco Left 01/13/2015    Procedure: CATARACT EXTRACTION PHACO AND INTRAOCULAR LENS PLACEMENT LEFT EYE;  Surgeon: Tonny Branch, MD;  Location: [facility];  Service: Ophthalmology;   Laterality: Left;  CDE:18.87  . Cholecystectomy    . Tee without cardioversion N/A 04/01/2015    Procedure: TRANSESOPHAGEAL ECHOCARDIOGRAM (TEE);  Surgeon: Sanda Klein, MD;  Location: [facility] ENDOSCOPY;  Service: Cardiovascular;  Laterality: N/A;    There were no vitals filed for this visit.  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  Impaired functional mobility, balance, gait, and endurance  Unsteadiness  Difficulty walking  Weakness generalized  At high risk for falls                       [facility] Adult PT Treatment/Exercise - 08/31/15 0001    Ambulation/Gait   Ambulation Distance (Feet) --  450, 225   Assistive device None   Gait Pattern Step-through pattern;Decreased arm swing - right;Decreased arm swing - left;Decreased stride length;Right foot flat;Left foot flat;Lateral trunk lean to right;Trunk flexed   Gait Comments Cues for improved heel-toe gait pattern, improved step length, obstacle navigation    Knee/Hip Exercises: Standing   Heel Raises Both;1 set;10 reps   Heel Raises Limitations heel raises and toe raises    Forward Lunges Both;1 set;10 reps   Forward Lunges Limitations 4 inch step    Rocker Board Limitations x11AP, x11 lateral; Mod assist for form   Knee/Hip Exercises: Supine   Bridges Both;1 set;10 reps   Straight Leg Raises Both;1 set;10 reps   Straight Leg Raises Limitations Min-Mod assist  Knee/Hip Exercises: Sidelying   Clams 1x15 each side, Min assist for form                 PT Education - 08/31/15 1556    Education provided Yes   Education Details bed mobility technique    Person(s) Educated Patient;Spouse   Methods Explanation;Tactile cues   Comprehension Need further instruction          PT Short Term Goals - 08/23/15 1743    PT SHORT TERM GOAL #1   Title Patient will be able to perform functional bed mobility tasks, including supine<->sit and rolling with distant supervision,  occasional cues for safety/sequencing    Time 4   Period Weeks   Status New   PT SHORT TERM GOAL #2   Title Patient will be able to perform sit to stand from low surfaces with distance supervision and only occasional cues for safety/sequencing    Time 4   Period Weeks   Status New   PT SHORT TERM GOAL #3   Title Patient will be able to perform TUG in 13 seconds or less on a consistent basis    Time 4   Period Weeks   Status New   PT SHORT TERM GOAL #4   Title Patient will be able to ambulate at least 584ft during 3 minute walk test with only minimal unsteadiness and good balance self-correction strategies    Time 4   Period Weeks   Status New   PT SHORT TERM GOAL #5   Title Patient and her spouse will be independent in correctly and consistently performing appropriate HEP, to be updated PRN    Time 4   Period Weeks   Status New           PT Long Term Goals - 08/23/15 1746    PT LONG TERM GOAL #1   Title Patient will demonstrate at least 4+/5 strength in bilateral lower extremities and at least 3+/5 strength in proximal musculature    Time 8   Period Weeks   Status New   PT LONG TERM GOAL #2   Title Patient will demonstrate good safety awareness at least 80% of the time when navigating through obstacles and in functional scenarios, to be evidenced by performance of functional obstacle course    Time 8   Period Weeks   Status New   PT LONG TERM GOAL #3   Title Patient and her spouse will report no falls within the past 4 weeks, and patient willl also demonstrate a score of no more than 11 on TUG    Time 8   Period Weeks   Status New   PT LONG TERM GOAL #4   Title Patient will be able to ambulate at least 1047ft during 6 minute walk test with no rest breaks, minimal unsteadiness and good balance self-correction strategies    Time 8   Period Weeks   Status New   PT LONG TERM GOAL #5   Title Patient to be able to maintain SLS on foam pad for at least 20 seconds each  lower extremity to evidence improved stability and reduced fall risk    Time 8   Period Weeks   Status New               Plan - 08/31/15 1556    Clinical Impression Statement Continued to focus on functional exercises and increased focus on gait training today. Patient demonstrates difficulty with consistently folllowing cues  for addressing gait impairments, as she has difficulty in heel-toe walking and in clearing LE for improved gait. Intorduced lunges and rocker board today; patient had significant difficulty on rocker board and did require Mod(A) for form. Appeared to possibly get somewhat anxious when separated from husband. She also appears to be somewhat limited in activity by fatigue at this time. Able to perform bed mobility with supervision-Min guard with her own technique today, but had difficulty with rolling-elbowtechnique for supine to sit.    Pt will benefit from skilled therapeutic intervention in order to improve on the following deficits Abnormal gait;Decreased endurance;Decreased activity tolerance;Decreased strength;Decreased balance;Decreased mobility;Difficulty walking;Impaired vision/preception;Impaired perceived functional ability;Decreased coordination;Decreased safety awareness;Postural dysfunction   Rehab Potential Good   PT Frequency Other (comment)   PT Duration Other (comment)   PT Treatment/Interventions ADLs/Self Care Home Management;DME Instruction;Gait training;Stair training;Functional mobility training;Therapeutic activities;Therapeutic exercise;Balance training;Neuromuscular re-education;Patient/family education;Manual techniques;Energy conservation;Visual/perceptual remediation/compensation   PT Next Visit Plan Review bed mobilty, functional stretching, strengthening, balance, safety training, functional activity tolerance   Begin gait training next session.     PT Home Exercise Plan given    Consulted and Agree with Plan of Care Patient         Problem List Patient Active Problem List   Diagnosis Date Noted  . 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 Right hemiparesis 04/04/2015  . 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 Aortic thrombus   . Stroke with cerebral ischemia   . 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 DM (diabetes mellitus) 03/23/2014  . Osteoporosis, unspecified 03/23/2014  . Dyslipidemia 03/23/2014  . Tobacco abuse 03/23/2014  . Syncope 01/01/2014  . Essential hypertension, benign 01/01/2014    Deniece Ree PT, DPT [phone]  [place] [address] [place], [place], [address] Phone: [phone]   Fax:  [phone]

## 2015-08-31 NOTE — Telephone Encounter (Signed)
She will have surgery on Monday 09/19/15 and will need time to recover

## 2015-09-02 ENCOUNTER — Ambulatory Visit (HOSPITAL_COMMUNITY): Payer: Medicare HMO | Admitting: Physical Therapy

## 2015-09-02 DIAGNOSIS — R531 Weakness: Secondary | ICD-10-CM

## 2015-09-02 DIAGNOSIS — R2681 Unsteadiness on feet: Secondary | ICD-10-CM

## 2015-09-02 DIAGNOSIS — R262 Difficulty in walking, not elsewhere classified: Secondary | ICD-10-CM

## 2015-09-02 DIAGNOSIS — Z7409 Other reduced mobility: Secondary | ICD-10-CM

## 2015-09-02 DIAGNOSIS — I63412 Cerebral infarction due to embolism of left middle cerebral artery: Secondary | ICD-10-CM | POA: Diagnosis not present

## 2015-09-02 NOTE — Therapy (Signed)
[place], [place], [address] Phone: [phone]   Fax:  [phone]  Physical Therapy Treatment  Patient Details  Name: Laura Melendez MRN: [id number] Date of Birth: 03/24/1941 Referring Provider:  Phillips Odor, MD  Encounter Date: 09/02/2015      PT End of Session - 09/02/15 1155    Visit Number 5   Number of Visits 20   Date for PT Re-Evaluation 09/20/15   Authorization Type [employer] PPO    Authorization Time Period 08/23/15 to 10/23/15   Authorization - Visit Number 5   Authorization - Number of Visits 10   PT Start Time 1100   PT Stop Time 1147   PT Time Calculation (min) 47 min   Equipment Utilized During Treatment Gait belt   Activity Tolerance Patient tolerated treatment well   Behavior During Therapy [facility] for tasks assessed/performed      Past Medical History  Diagnosis Date  . Essential hypertension, benign   . Type 2 diabetes mellitus   . Osteoporosis 06/2008  . Stroke     Past Surgical History  Procedure Laterality Date  . Ankle surgery Left 2004    otif  . Abdominal hysterectomy    . Anterior and posterior repair N/A 03/11/2013    Procedure: ANTERIOR (CYSTOCELE) AND POSTERIOR REPAIR (RECTOCELE);  Surgeon: Linda Hedges, DO;  Location: [place] [facility];  Service: Gynecology;  Laterality: N/A;  with sacrospinous ligament fixation bilateral  . Colonoscopy  2009    negative  . Cataract extraction w/phaco Right 12/27/2014    Procedure: CATARACT EXTRACTION PHACO AND INTRAOCULAR LENS PLACEMENT RIGHT EYE;  Surgeon: Tonny Branch, MD;  Location: [facility];  Service: Ophthalmology;  Laterality: Right;  CDE:10.63  . Cataract extraction w/phaco Left 01/13/2015    Procedure: CATARACT EXTRACTION PHACO AND INTRAOCULAR LENS PLACEMENT LEFT EYE;  Surgeon: Tonny Branch, MD;  Location: [facility];  Service: Ophthalmology;  Laterality: Left;  CDE:18.87  . Cholecystectomy    . Tee without cardioversion N/A 04/01/2015   Procedure: TRANSESOPHAGEAL ECHOCARDIOGRAM (TEE);  Surgeon: Sanda Klein, MD;  Location: [facility];  Service: Cardiovascular;  Laterality: N/A;    There were no vitals filed for this visit.  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 She continues to have difficulty with getting in and out of bed and getting up and down from a chair.    Currently in Pain? No/denies   Multiple Pain Sites No                         [facility] Adult PT Treatment/Exercise - 09/02/15 0001    Bed Mobility   Left Sidelying to Sit 4: Min guard;5: Supervision  cueing for technique   Left Sidelying to Sit Details (indicate cue type and reason) cueing for proper technique   Ambulation/Gait   Ambulation Distance (Feet) --  100, 30x6   Assistive device None   Gait Pattern Step-through pattern;Decreased arm swing - right;Decreased arm swing - left;Decreased stride length;Right foot flat;Left foot flat;Lateral trunk lean to right;Trunk flexed   Pre-Gait Activities Step-through in // bars with emphasis on heel strike, stepping over straight weights in // bars to improve foot clearance and heel strike   Knee/Hip Exercises: Standing  Heel Raises Both;1 set;10 reps   Gait Training 30' x 6 with emphasis on heel strike and step length   Other Standing Knee Exercises tap ups forward and laterally at 6" step   Knee/Hip Exercises: Seated   Sit to Sand 10 reps;without UE support  Cueing for forward flexion prior sit to stand   Knee/Hip Exercises: Supine   Bridges 15 reps                PT Education - 09/02/15 1154    Education provided Yes   Education Details Educated on sidelying to sit technique, heel strike in gait   Person(s) Educated Patient;Spouse    Methods Explanation   Comprehension Need further instruction;Verbalized understanding          PT Short Term Goals - 08/23/15 1743    PT SHORT TERM GOAL #1   Title Patient will be able to perform functional bed mobility tasks, including supine<->sit and rolling with distant supervision, occasional cues for safety/sequencing    Time 4   Period Weeks   Status New   PT SHORT TERM GOAL #2   Title Patient will be able to perform sit to stand from low surfaces with distance supervision and only occasional cues for safety/sequencing    Time 4   Period Weeks   Status New   PT SHORT TERM GOAL #3   Title Patient will be able to perform TUG in 13 seconds or less on a consistent basis    Time 4   Period Weeks   Status New   PT SHORT TERM GOAL #4   Title Patient will be able to ambulate at least 563ft during 3 minute walk test with only minimal unsteadiness and good balance self-correction strategies    Time 4   Period Weeks   Status New   PT SHORT TERM GOAL #5   Title Patient and her spouse will be independent in correctly and consistently performing appropriate HEP, to be updated PRN    Time 4   Period Weeks   Status New           PT Long Term Goals - 08/23/15 1746    PT LONG TERM GOAL #1   Title Patient will demonstrate at least 4+/5 strength in bilateral lower extremities and at least 3+/5 strength in proximal musculature    Time 8   Period Weeks   Status New   PT LONG TERM GOAL #2   Title Patient will demonstrate good safety awareness at least 80% of the time when navigating through obstacles and in functional scenarios, to be evidenced by performance of functional obstacle course    Time 8   Period Weeks   Status New   PT LONG TERM GOAL #3   Title Patient and her spouse will report no falls within the past 4 weeks, and patient willl also demonstrate a score of no more than 11 on TUG    Time 8   Period Weeks   Status New   PT LONG TERM GOAL #4   Title Patient will be  able to ambulate at least 108ft during 6 minute walk test with no rest breaks, minimal unsteadiness and good balance self-correction strategies    Time 8   Period Weeks   Status New   PT LONG TERM GOAL #5   Title Patient to be able to maintain SLS on foam pad for at least 20 seconds each lower extremity to evidence improved stability and reduced  fall risk    Time 8   Period Weeks   Status New               Plan - 09/02/15 1155    Clinical Impression Statement Functional mobility training and gait training were the main focus of today's treatment session. Sidelying to sit technique was reviewed with pt with good carryover following verbal and visual cueing. Pt continues to demonstrate decreased foot clearance, decreased step length R>L, foot flat on R, and trunk lean to R when ambulating. Pre-gait training was completed in // bars with emphasis on heel strike and foot clearance, pt required max verbal cueing for heel strike.    PT Next Visit Plan Continue with gait training and bed mobility training.         Problem List Patient Active Problem List   Diagnosis Date Noted  . 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 Right hemiparesis 04/04/2015  . 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 Aortic thrombus   . Stroke with cerebral ischemia   . 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 DM (diabetes mellitus) 03/23/2014  . Osteoporosis, unspecified 03/23/2014  . Dyslipidemia 03/23/2014  . Tobacco abuse 03/23/2014  . Syncope 01/01/2014  . Essential hypertension, benign 01/01/2014    Hilma Favors, PT, DPT [phone] 09/02/2015, 12:08 PM  [facility] [address] [place], [place], [address] Phone: [phone]   Fax:  [phone]

## 2015-09-05 ENCOUNTER — Ambulatory Visit (HOSPITAL_COMMUNITY): Payer: Medicare HMO

## 2015-09-05 DIAGNOSIS — Z7409 Other reduced mobility: Secondary | ICD-10-CM

## 2015-09-05 DIAGNOSIS — I63412 Cerebral infarction due to embolism of left middle cerebral artery: Secondary | ICD-10-CM | POA: Diagnosis not present

## 2015-09-05 DIAGNOSIS — R262 Difficulty in walking, not elsewhere classified: Secondary | ICD-10-CM

## 2015-09-05 DIAGNOSIS — R2681 Unsteadiness on feet: Secondary | ICD-10-CM

## 2015-09-05 DIAGNOSIS — Z9181 History of falling: Secondary | ICD-10-CM

## 2015-09-05 DIAGNOSIS — R531 Weakness: Secondary | ICD-10-CM

## 2015-09-05 NOTE — Therapy (Signed)
[place], [place], [address] Phone: [phone]   Fax:  [phone]  Physical Therapy Treatment  Patient Details  Name: Laura Melendez MRN: [id number] Date of Birth: 09-17-1941 Referring Provider:  [name], MD  Encounter Date: 09/05/2015      PT End of Session - 09/05/15 1153    Visit Number 6   Number of Visits 20   Date for PT Re-Evaluation 09/20/15   Authorization Type [employer] Medicare PPO    Authorization Time Period 08/23/15 to 10/23/15   Authorization - Visit Number 6   Authorization - Number of Visits 10   PT Start Time 1107   PT Stop Time 1148   PT Time Calculation (min) 41 min   Equipment Utilized During Treatment Gait belt   Activity Tolerance Patient tolerated treatment well;Patient limited by fatigue   Behavior During Therapy [facility] for tasks assessed/performed;Flat affect      Past Medical History  Diagnosis Date  . Essential hypertension, benign   . Type 2 diabetes mellitus   . Osteoporosis 06/2008  . Stroke     Past Surgical History  Procedure Laterality Date  . Ankle surgery Left 2004    otif  . Abdominal hysterectomy    . Anterior and posterior repair N/A 03/11/2013    Procedure: ANTERIOR (CYSTOCELE) AND POSTERIOR REPAIR (RECTOCELE);  Surgeon: Linda Hedges, DO;  Location: [facility] [facility];  Service: Gynecology;  Laterality: N/A;  with sacrospinous ligament fixation bilateral  . Colonoscopy  2009    negative  . Cataract extraction w/phaco Right 12/27/2014    Procedure: CATARACT EXTRACTION PHACO AND INTRAOCULAR LENS PLACEMENT RIGHT EYE;  Surgeon: Tonny Branch, MD;  Location: [facility];  Service: Ophthalmology;  Laterality: Right;  CDE:10.63  . Cataract extraction w/phaco Left 01/13/2015    Procedure: CATARACT EXTRACTION PHACO AND INTRAOCULAR LENS PLACEMENT LEFT EYE;  Surgeon: Tonny Branch, MD;  Location: [facility];  Service: Ophthalmology;  Laterality: Left;  CDE:18.87  . Cholecystectomy    . Tee  without cardioversion N/A 04/01/2015    Procedure: TRANSESOPHAGEAL ECHOCARDIOGRAM (TEE);  Surgeon: Sanda Klein, MD;  Location: [facility] END[facility]COPY;  Service: Cardiovascular;  Laterality: N/A;    There were no vitals filed for this visit.  Visit Diagnosis:  Difficulty walking  Weakness generalized  At high risk for falls  Unsteadiness  Impaired functional mobility, balance, gait, and endurance                       [facility] Adult PT Treatment/Exercise - 09/05/15 0001    Ambulation/Gait   Ambulation/Gait Yes   Ambulation Distance (Feet) 450 Feet  2x225, therex between bouts   Gait Comments cues for (+) step length,  (+)heel strike   Knee/Hip Exercises: Aerobic   Nustep Reciprocal gait training, 24 bpm  10 minutes, level zero, seat 5, arms 7   Knee/Hip Exercises: Standing   Other Standing Knee Exercises tap ups forward and laterally at 6" step   Knee/Hip Exercises: Seated   Long Arc Quad Right;2 sets;15 reps  verbal cues to attend to task   Clamshell with TheraBand Red  2x10, heavy tactile cues   Sit to Sand without UE support;2 sets  2x5, cues for avoiding valgus   Knee/Hip Exercises: Supine   Other Supine Knee/Hip Exercises ankle DF AAROM  2x15 bilat                PT Education - 09/05/15 1152  Education provided No          PT Short Term Goals - 08/23/15 1743    PT SHORT TERM GOAL #1   Title Patient will be able to perform functional bed mobility tasks, including supine<->sit and rolling with distant supervision, occasional cues for safety/sequencing    Time 4   Period Weeks   Status New   PT SHORT TERM GOAL #2   Title Patient will be able to perform sit to stand from low surfaces with distance supervision and only occasional cues for safety/sequencing    Time 4   Period Weeks   Status New   PT SHORT TERM GOAL #3   Title Patient will be able to perform TUG in 13 seconds or less on a consistent basis    Time 4   Period Weeks   Status  New   PT SHORT TERM GOAL #4   Title Patient will be able to ambulate at least 554ft during 3 minute walk test with only minimal unsteadiness and good balance self-correction strategies    Time 4   Period Weeks   Status New   PT SHORT TERM GOAL #5   Title Patient and her spouse will be independent in correctly and consistently performing appropriate HEP, to be updated PRN    Time 4   Period Weeks   Status New           PT Long Term Goals - 08/23/15 1746    PT LONG TERM GOAL #1   Title Patient will demonstrate at least 4+/5 strength in bilateral lower extremities and at least 3+/5 strength in proximal musculature    Time 8   Period Weeks   Status New   PT LONG TERM GOAL #2   Title Patient will demonstrate good safety awareness at least 80% of the time when navigating through obstacles and in functional scenarios, to be evidenced by performance of functional obstacle course    Time 8   Period Weeks   Status New   PT LONG TERM GOAL #3   Title Patient and her spouse will report no falls within the past 4 weeks, and patient willl also demonstrate a score of no more than 11 on TUG    Time 8   Period Weeks   Status New   PT LONG TERM GOAL #4   Title Patient will be able to ambulate at least 1029ft during 6 minute walk test with no rest breaks, minimal unsteadiness and good balance self-correction strategies    Time 8   Period Weeks   Status New   PT LONG TERM GOAL #5   Title Patient to be able to maintain SLS on foam pad for at least 20 seconds each lower extremity to evidence improved stability and reduced fall risk    Time 8   Period Weeks   Status New               Plan - 09/05/15 1156    Clinical Impression Statement Pt continues to make progress today in goals towards improving strength, gait quality, and tolerance to activity. Pt continues to be most limited by mild apraxia, difficulty focusing, and high distractibility. Gait distance is improved this session, but  pt shows difficulty maintaining quality of gait after first 167feet, adn still requires heavy cues to stay on task. Pt started  on NuStep today, but requires frequent verbal and/or tactile cues to keep machine going.    Pt will benefit from  skilled therapeutic intervention in order to improve on the following deficits Abnormal gait;Decreased endurance;Decreased activity tolerance;Decreased strength;Decreased balance;Decreased mobility;Difficulty walking;Impaired vision/preception;Impaired perceived functional ability;Decreased coordination;Decreased safety awareness;Postural dysfunction   Rehab Potential Good   PT Frequency Other (comment)   PT Duration Other (comment)   PT Treatment/Interventions ADLs/Self Care Home Management;DME Instruction;Gait training;Stair training;Functional mobility training;Therapeutic activities;Therapeutic exercise;Balance training;Neuromuscular re-education;Patient/family education;Manual techniques;Energy conservation;Visual/perceptual remediation/compensation   PT Next Visit Plan Continue with gait training and bed mobility training.    PT Home Exercise Plan no changes    Consulted and Agree with Plan of Care Patient        Problem List Patient Active Problem List   Diagnosis Date Noted  . 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 Right hemiparesis 04/04/2015  . 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 Aortic thrombus   . Stroke with cerebral ischemia   . 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 DM (diabetes mellitus) 03/23/2014  . Osteoporosis, unspecified 03/23/2014  . Dyslipidemia 03/23/2014  . Tobacco abuse 03/23/2014  . Syncope 01/01/2014  . Essential hypertension, benign 01/01/2014    Buccola,Allan C 09/05/2015, 12:13 PM 12:13 PM  Etta Grandchild, PT, DPT [place] License # [id number]      [facility] [address] [place], [place], [address] Phone: [phone]   Fax:  [phone]

## 2015-09-05 NOTE — Patient Instructions (Signed)
No changes this session.

## 2015-09-07 ENCOUNTER — Ambulatory Visit (HOSPITAL_COMMUNITY): Payer: Medicare HMO | Admitting: Physical Therapy

## 2015-09-07 DIAGNOSIS — I63412 Cerebral infarction due to embolism of left middle cerebral artery: Secondary | ICD-10-CM | POA: Diagnosis not present

## 2015-09-07 DIAGNOSIS — R531 Weakness: Secondary | ICD-10-CM

## 2015-09-07 DIAGNOSIS — R2681 Unsteadiness on feet: Secondary | ICD-10-CM

## 2015-09-07 DIAGNOSIS — Z9181 History of falling: Secondary | ICD-10-CM

## 2015-09-07 DIAGNOSIS — R262 Difficulty in walking, not elsewhere classified: Secondary | ICD-10-CM

## 2015-09-07 NOTE — Therapy (Signed)
[place], [place], [address] Phone: [phone]   Fax:  [phone]  Physical Therapy Treatment  Patient Details  Name: Laura Melendez MRN: [id number] Date of Birth: 08/12/1941 Referring Provider:  [name], MD  Encounter Date: 09/07/2015      PT End of Session - 09/07/15 1604    Visit Number 7   Number of Visits 20   Date for PT Re-Evaluation 09/20/15   Authorization Type [employer] Medicare PPO    Authorization Time Period 08/23/15 to 10/23/15   Authorization - Visit Number 7   Authorization - Number of Visits 10   PT Start Time 1430   PT Stop Time 5009   PT Time Calculation (min) 44 min   Equipment Utilized During Treatment Gait belt   Activity Tolerance Patient tolerated treatment well   Behavior During Therapy [facility] for tasks assessed/performed      Past Medical History  Diagnosis Date  . Essential hypertension, benign   . Type 2 diabetes mellitus   . Osteoporosis 06/2008  . Stroke     Past Surgical History  Procedure Laterality Date  . Ankle surgery Left 2004    otif  . Abdominal hysterectomy    . Anterior and posterior repair N/A 03/11/2013    Procedure: ANTERIOR (CYSTOCELE) AND POSTERIOR REPAIR (RECTOCELE);  Surgeon: Linda Hedges, DO;  Location: [place] [facility];  Service: Gynecology;  Laterality: N/A;  with sacrospinous ligament fixation bilateral  . Colonoscopy  2009    negative  . Cataract extraction w/phaco Right 12/27/2014    Procedure: CATARACT EXTRACTION PHACO AND INTRAOCULAR LENS PLACEMENT RIGHT EYE;  Surgeon: Tonny Branch, MD;  Location: [facility];  Service: Ophthalmology;  Laterality: Right;  CDE:10.63  . Cataract extraction w/phaco Left 01/13/2015    Procedure: CATARACT EXTRACTION PHACO AND INTRAOCULAR LENS PLACEMENT LEFT EYE;  Surgeon: Tonny Branch, MD;  Location: [facility];  Service: Ophthalmology;  Laterality: Left;  CDE:18.87  . Cholecystectomy    . Tee without cardioversion N/A 04/01/2015   Procedure: TRANSESOPHAGEAL ECHOCARDIOGRAM (TEE);  Surgeon: Sanda Klein, MD;  Location: [facility] ENDOSCOPY;  Service: Cardiovascular;  Laterality: N/A;    There were no vitals filed for this visit.  Visit Diagnosis:  Difficulty walking  Weakness generalized  At high risk for falls  Unsteadiness      Subjective Assessment - 09/07/15 1442    Subjective Pt reports that she feels "wobbly" today, denies any dizziness or lightheadedness. Pt and husband report that she has had no recent falls   Currently in Pain? No/denies   Multiple Pain Sites No                         [facility] Adult PT Treatment/Exercise - 09/07/15 0001    Knee/Hip Exercises: Standing   Gait Training stepping over 6" hurdles in // bars x 3 RT   Other Standing Knee Exercises tap ups forward and laterally at 6" step   Knee/Hip Exercises: Seated   Long Arc Quad Right;2 sets;15 reps  verbal cues to attend to task   Sit to Sand 10 reps;without UE support  Cueing for forward flexion to improve form   Knee/Hip Exercises: Supine   Bridges 15 reps   Straight Leg Raises Both;1 set;10 reps                  PT Short Term Goals - 08/23/15 1743    PT SHORT TERM GOAL #1  Title Patient will be able to perform functional bed mobility tasks, including supine<->sit and rolling with distant supervision, occasional cues for safety/sequencing    Time 4   Period Weeks   Status New   PT SHORT TERM GOAL #2   Title Patient will be able to perform sit to stand from low surfaces with distance supervision and only occasional cues for safety/sequencing    Time 4   Period Weeks   Status New   PT SHORT TERM GOAL #3   Title Patient will be able to perform TUG in 13 seconds or less on a consistent basis    Time 4   Period Weeks   Status New   PT SHORT TERM GOAL #4   Title Patient will be able to ambulate at least 58ft during 3 minute walk test with only minimal unsteadiness and good balance self-correction  strategies    Time 4   Period Weeks   Status New   PT SHORT TERM GOAL #5   Title Patient and her spouse will be independent in correctly and consistently performing appropriate HEP, to be updated PRN    Time 4   Period Weeks   Status New           PT Long Term Goals - 08/23/15 1746    PT LONG TERM GOAL #1   Title Patient will demonstrate at least 4+/5 strength in bilateral lower extremities and at least 3+/5 strength in proximal musculature    Time 8   Period Weeks   Status New   PT LONG TERM GOAL #2   Title Patient will demonstrate good safety awareness at least 80% of the time when navigating through obstacles and in functional scenarios, to be evidenced by performance of functional obstacle course    Time 8   Period Weeks   Status New   PT LONG TERM GOAL #3   Title Patient and her spouse will report no falls within the past 4 weeks, and patient willl also demonstrate a score of no more than 11 on TUG    Time 8   Period Weeks   Status New   PT LONG TERM GOAL #4   Title Patient will be able to ambulate at least 1076ft during 6 minute walk test with no rest breaks, minimal unsteadiness and good balance self-correction strategies    Time 8   Period Weeks   Status New   PT LONG TERM GOAL #5   Title Patient to be able to maintain SLS on foam pad for at least 20 seconds each lower extremity to evidence improved stability and reduced fall risk    Time 8   Period Weeks   Status New               Plan - 09/07/15 1611    Clinical Impression Statement Continued focus on LE strengthening and improving gait mechanics today. Pt continues to demonstrate decreased step length on RLE, decreased foot clearance with RLE, and demonstrates shuffling gait at times. Stepping over hurdles in parallel bars was added today to improve foot clearance, with verbal cueing for heel strike. Pt continues to demonstrate poor motor planning and decreased attention to task, requiring frequent  cueing throughout treatment session.    PT Next Visit Plan Continue with gait training and bed mobility training.         Problem List Patient Active Problem List   Diagnosis Date Noted  . 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 Depression  due to stroke 04/13/2015  . 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 Right hemiparesis 04/04/2015  . 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 Aortic thrombus   . Stroke with cerebral ischemia   . 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 DM (diabetes mellitus) 03/23/2014  . Osteoporosis, unspecified 03/23/2014  . Dyslipidemia 03/23/2014  . Tobacco abuse 03/23/2014  . Syncope 01/01/2014  . Essential hypertension, benign 01/01/2014    Hilma Favors, PT, DPT [phone] 09/07/2015, 4:15 PM  [facility] [address] [place], [place], [address] Phone: [phone]   Fax:  [phone]

## 2015-09-08 ENCOUNTER — Encounter: Payer: Self-pay | Admitting: Family Medicine

## 2015-09-08 ENCOUNTER — Ambulatory Visit: Payer: Medicare HMO | Admitting: Family Medicine

## 2015-09-08 VITALS — BP 110/70 | Ht 61.0 in

## 2015-09-08 DIAGNOSIS — D379 Neoplasm of uncertain behavior of digestive organ, unspecified: Secondary | ICD-10-CM

## 2015-09-08 DIAGNOSIS — D490 Neoplasm of unspecified behavior of digestive system: Secondary | ICD-10-CM

## 2015-09-08 MED ORDER — ENOXAPARIN SODIUM 40 MG/0.4ML ~~LOC~~ SOLN: 40.0000 mg | SUBCUTANEOUS | Status: DC

## 2015-09-08 NOTE — Progress Notes (Signed)
   Subjective:    Patient ID: Laura Melendez, female    DOB: January 16, 1941, 74 y.o.   MRN: [id number]  HPI Patient is here today for a surgical clearance. 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  Patient has been having diarrhea lately. This has been present for about 1 week now.   Patient without vomiting no fever chills no mucousy stools. History of stroke. History of aortic mural thrombus for which is recommended for the patient be on an acquired once Patient currently on Coumadin Patient with vascular stenosis of cerebellar arteries. Patient has risk factors for vascular disease including diabetes hyperlipidemia history of tobacco abuse and hypertension  Patient also having some cognitive effects from stroke from several months ago undergoing physical therapy occupational therapy for significant deficits.       Review of Systems Patient denies chest tightness shortness of breath. She denies any severe headaches.   Objective:   Physical Exam Lungs clear heart regular   Patient with nodule in the left cheek region. No masses felt in the neck Abdomen soft     Assessment & Plan:  Patient with Coumadin-we discussed how to use Lovenox in place of Coumadin all the way up through surgery the surgeon would let her know when to resume Lovenox as well as Coumadin. Family will let us know how surgery went. We will follow INR closely thereafter  The patient is approved for surgery. Because of underlying frail health she is at risk of complications although I feel her heart is fine for surgery.It was felt the benefits outweighed the risk. A discussion of this was held with wife and husband.  Addendum- The husband was instructed we would use once daily dosing at 1 mg/kg through the day before surgery to prevent blood clot while off the coumadin, then resume treatment after the surgery depending on how everything goes.

## 2015-09-09 ENCOUNTER — Ambulatory Visit (HOSPITAL_COMMUNITY): Payer: Medicare HMO | Admitting: Physical Therapy

## 2015-09-09 DIAGNOSIS — R262 Difficulty in walking, not elsewhere classified: Secondary | ICD-10-CM

## 2015-09-09 DIAGNOSIS — Z9181 History of falling: Secondary | ICD-10-CM

## 2015-09-09 DIAGNOSIS — R2681 Unsteadiness on feet: Secondary | ICD-10-CM

## 2015-09-09 DIAGNOSIS — I63412 Cerebral infarction due to embolism of left middle cerebral artery: Secondary | ICD-10-CM | POA: Diagnosis not present

## 2015-09-09 DIAGNOSIS — R531 Weakness: Secondary | ICD-10-CM

## 2015-09-09 NOTE — Therapy (Signed)
[place], [place], [address] Phone: [phone]   Fax:  [phone]  Physical Therapy Treatment  Patient Details  Name: Laura Melendez MRN: [id number] Date of Birth: 1941-03-10 Referring Provider:  [name], MD  Encounter Date: 09/09/2015      PT End of Session - 09/09/15 1200    Visit Number 8   Number of Visits 20   Date for PT Re-Evaluation 09/20/15   Authorization Type [employer] PPO    Authorization Time Period 08/23/15 to 10/23/15   Authorization - Visit Number 8   Authorization - Number of Visits 10   PT Start Time 1101   PT Stop Time 1144   PT Time Calculation (min) 43 min   Equipment Utilized During Treatment Gait belt   Activity Tolerance Patient tolerated treatment well   Behavior During Therapy [facility] for tasks assessed/performed      Past Medical History  Diagnosis Date  . Essential hypertension, benign   . Type 2 diabetes mellitus   . Osteoporosis 06/2008  . Stroke     Past Surgical History  Procedure Laterality Date  . Ankle surgery Left 2004    otif  . Abdominal hysterectomy    . Anterior and posterior repair N/A 03/11/2013    Procedure: ANTERIOR (CYSTOCELE) AND POSTERIOR REPAIR (RECTOCELE);  Surgeon: Linda Hedges, DO;  Location: [facility];  Service: Gynecology;  Laterality: N/A;  with sacrospinous ligament fixation bilateral  . Colonoscopy  2009    negative  . Cataract extraction w/phaco Right 12/27/2014    Procedure: CATARACT EXTRACTION PHACO AND INTRAOCULAR LENS PLACEMENT RIGHT EYE;  Surgeon: Tonny Branch, MD;  Location: [facility];  Service: Ophthalmology;  Laterality: Right;  CDE:10.63  . Cataract extraction w/phaco Left 01/13/2015    Procedure: CATARACT EXTRACTION PHACO AND INTRAOCULAR LENS PLACEMENT LEFT EYE;  Surgeon: Tonny Branch, MD;  Location: [facility];  Service: Ophthalmology;  Laterality: Left;  CDE:18.87  . Cholecystectomy    . Tee without cardioversion N/A 04/01/2015   Procedure: TRANSESOPHAGEAL ECHOCARDIOGRAM (TEE);  Surgeon: Sanda Klein, MD;  Location: [facility];  Service: Cardiovascular;  Laterality: N/A;    There were no vitals filed for this visit.  Visit Diagnosis:  Difficulty walking  Weakness generalized  At high risk for falls  Unsteadiness      Subjective Assessment - 09/09/15 1156    Subjective Pt denies any pain today, reports that she feels more steady on her feet today than she did last session.    Currently in Pain? No/denies   Multiple Pain Sites No              [facility] Adult PT Treatment/Exercise - 09/09/15 0001    Knee/Hip Exercises: Standing   Rocker Board Limitations x20 R/L   Gait Training stepping over 2# weights in and out of // bars   Other Standing Knee Exercises cone rotation on airex   Knee/Hip Exercises: Supine   Bridges 15 reps   Straight Leg Raises Both;1 set;10 reps   Other Supine Knee/Hip Exercises ankle DF x 15                  PT Short Term Goals - 08/23/15 1743    PT SHORT TERM GOAL #1   Title Patient will be able to perform functional bed mobility tasks, including supine<->sit and rolling with distant supervision, occasional cues for safety/sequencing    Time 4   Period Weeks   Status New  PT SHORT TERM GOAL #2   Title Patient will be able to perform sit to stand from low surfaces with distance supervision and only occasional cues for safety/sequencing    Time 4   Period Weeks   Status New   PT SHORT TERM GOAL #3   Title Patient will be able to perform TUG in 13 seconds or less on a consistent basis    Time 4   Period Weeks   Status New   PT SHORT TERM GOAL #4   Title Patient will be able to ambulate at least 549ft during 3 minute walk test with only minimal unsteadiness and good balance self-correction strategies    Time 4   Period Weeks   Status New   PT SHORT TERM GOAL #5   Title Patient and her spouse will be independent in correctly and consistently performing  appropriate HEP, to be updated PRN    Time 4   Period Weeks   Status New           PT Long Term Goals - 08/23/15 1746    PT LONG TERM GOAL #1   Title Patient will demonstrate at least 4+/5 strength in bilateral lower extremities and at least 3+/5 strength in proximal musculature    Time 8   Period Weeks   Status New   PT LONG TERM GOAL #2   Title Patient will demonstrate good safety awareness at least 80% of the time when navigating through obstacles and in functional scenarios, to be evidenced by performance of functional obstacle course    Time 8   Period Weeks   Status New   PT LONG TERM GOAL #3   Title Patient and her spouse will report no falls within the past 4 weeks, and patient willl also demonstrate a score of no more than 11 on TUG    Time 8   Period Weeks   Status New   PT LONG TERM GOAL #4   Title Patient will be able to ambulate at least 1073ft during 6 minute walk test with no rest breaks, minimal unsteadiness and good balance self-correction strategies    Time 8   Period Weeks   Status New   PT LONG TERM GOAL #5   Title Patient to be able to maintain SLS on foam pad for at least 20 seconds each lower extremity to evidence improved stability and reduced fall risk    Time 8   Period Weeks   Status New               Plan - 09/09/15 1201    Clinical Impression Statement Treatment session focused on improving LE strength to improve functional mobility and gait mechanics, improving step length and heel strike, and standing balance training. Pt was unable to achieve heel strike when stepping over 2# weights outside of // bars, but was more successful with the activity when completed with one hand support in // bars. Cone rotation while standing on airex was introduced today. Pt required max verbal and tactile cueing to perform task without UE support, and had difficulty following commands during the exercise. Pt demonstrated poor safety awareness in today's  treatment during functional transfers, requiring mod A from PT for approaching chair to sit in order to prevent pt from falling.   PT Next Visit Plan Continue with gait and balance training        Problem List Patient Active Problem List   Diagnosis Date Noted  . 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 Right hemiparesis 04/04/2015  . 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 Aortic thrombus   . Stroke with cerebral ischemia   . 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 DM (diabetes mellitus) 03/23/2014  . Osteoporosis, unspecified 03/23/2014  . Dyslipidemia 03/23/2014  . Tobacco abuse 03/23/2014  . Syncope 01/01/2014  . Essential hypertension, benign 01/01/2014    Hilma Favors, PT, DPT [id number] 09/09/2015, 12:09 PM  [place] [address] [place], [place], [address] Phone: [phone]   Fax:  [phone]

## 2015-09-12 ENCOUNTER — Ambulatory Visit (HOSPITAL_COMMUNITY): Payer: Medicare HMO | Admitting: Physical Therapy

## 2015-09-12 DIAGNOSIS — I63412 Cerebral infarction due to embolism of left middle cerebral artery: Secondary | ICD-10-CM

## 2015-09-12 DIAGNOSIS — Z9181 History of falling: Secondary | ICD-10-CM

## 2015-09-12 DIAGNOSIS — R2681 Unsteadiness on feet: Secondary | ICD-10-CM

## 2015-09-12 DIAGNOSIS — Z7409 Other reduced mobility: Secondary | ICD-10-CM

## 2015-09-12 DIAGNOSIS — R531 Weakness: Secondary | ICD-10-CM

## 2015-09-12 DIAGNOSIS — R262 Difficulty in walking, not elsewhere classified: Secondary | ICD-10-CM

## 2015-09-12 NOTE — Therapy (Signed)
[place], [place], [address] Phone: [phone]   Fax:  [phone]  Physical Therapy Treatment  Patient Details  Name: Laura Melendez MRN: [id number] Date of Birth: 22-Apr-1941 Referring Provider:  Phillips Odor, MD  Encounter Date: 09/12/2015      PT End of Session - 09/12/15 1701    Visit Number 9   Number of Visits 20   Date for PT Re-Evaluation 09/20/15   Authorization Type [employer] Medicare PPO    Authorization Time Period 08/23/15 to 10/23/15   Authorization - Visit Number 9   Authorization - Number of Visits 10   PT Start Time 1435   PT Stop Time 1513   PT Time Calculation (min) 38 min   Equipment Utilized During Treatment Gait belt   Activity Tolerance Patient tolerated treatment well   Behavior During Therapy [facility] for tasks assessed/performed      Past Medical History  Diagnosis Date  . Essential hypertension, benign   . Type 2 diabetes mellitus   . Osteoporosis 06/2008  . Stroke     Past Surgical History  Procedure Laterality Date  . Ankle surgery Left 2004    otif  . Abdominal hysterectomy    . Anterior and posterior repair N/A 03/11/2013    Procedure: ANTERIOR (CYSTOCELE) AND POSTERIOR REPAIR (RECTOCELE);  Surgeon: Linda Hedges, DO;  Location: [facility];  Service: Gynecology;  Laterality: N/A;  with sacrospinous ligament fixation bilateral  . Colonoscopy  2009    negative  . Cataract extraction w/phaco Right 12/27/2014    Procedure: CATARACT EXTRACTION PHACO AND INTRAOCULAR LENS PLACEMENT RIGHT EYE;  Surgeon: Tonny Branch, MD;  Location: [facility];  Service: Ophthalmology;  Laterality: Right;  CDE:10.63  . Cataract extraction w/phaco Left 01/13/2015    Procedure: CATARACT EXTRACTION PHACO AND INTRAOCULAR LENS PLACEMENT LEFT EYE;  Surgeon: Tonny Branch, MD;  Location: [facility];  Service: Ophthalmology;  Laterality: Left;  CDE:18.87  . Cholecystectomy    . Tee without cardioversion N/A 04/01/2015   Procedure: TRANSESOPHAGEAL ECHOCARDIOGRAM (TEE);  Surgeon: Sanda Klein, MD;  Location: [facility];  Service: Cardiovascular;  Laterality: N/A;    There were no vitals filed for this visit.  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 No/denies                         [facility] Adult PT Treatment/Exercise - 09/12/15 0001    Ambulation/Gait   Ambulation/Gait Yes   Ambulation/Gait Assistance --  200, 90, 90   Gait Comments used red therband strapping to mimic AFO with improvements in gait speed, stride length, overall balance noted although patient did require cues for appropriate pacing and safety    Knee/Hip Exercises: Standing   Gait Training navigation around closely placed cones and over 6 inch hurdles with cues for form and technique    Knee/Hip Exercises: Seated   Long Arc Quad 2 sets;15 reps;Right   Knee/Hip Exercises: Supine   Bridges 20 reps   Straight Leg Raises Both;1 set;10 reps   Other Supine Knee/Hip Exercises ankle DF  x 15   Knee/Hip Exercises: Sidelying   Clams 1x15 each side, Min assist for form                 PT Education - 09/12/15 1701    Education provided No          PT Short Term Goals - 08/23/15 1743    PT SHORT TERM GOAL #1   Title Patient will be able to perform functional bed mobility tasks, including supine<->sit and rolling with distant supervision, occasional cues for safety/sequencing    Time 4   Period Weeks   Status New   PT SHORT TERM GOAL #2   Title Patient will be able to perform sit to stand from low surfaces with distance supervision and  only occasional cues for safety/sequencing    Time 4   Period Weeks   Status New   PT SHORT TERM GOAL #3   Title Patient will be able to perform TUG in 13 seconds or less on a consistent basis    Time 4   Period Weeks   Status New   PT SHORT TERM GOAL #4   Title Patient will be able to ambulate at least 524ft during 3 minute walk test with only minimal unsteadiness and good balance self-correction strategies    Time 4   Period Weeks   Status New   PT SHORT TERM GOAL #5   Title Patient and her spouse will be independent in correctly and consistently performing appropriate HEP, to be updated PRN    Time 4   Period Weeks   Status New           PT Long Term Goals - 08/23/15 1746    PT LONG TERM GOAL #1   Title Patient will demonstrate at least 4+/5 strength in bilateral lower extremities and at least 3+/5 strength in proximal musculature    Time 8   Period Weeks   Status New   PT LONG TERM GOAL #2   Title Patient will demonstrate good safety awareness at least 80% of the time when navigating through obstacles and in functional scenarios, to be evidenced by performance of functional obstacle course    Time 8   Period Weeks   Status New   PT LONG TERM GOAL #3   Title Patient and her spouse will report no falls within the past 4 weeks, and patient willl also demonstrate a score of no more than 11 on TUG    Time 8   Period Weeks   Status New   PT LONG TERM GOAL #4   Title Patient will be able to ambulate at least 1020ft during 6 minute walk test with no rest breaks, minimal unsteadiness and good balance self-correction strategies    Time 8   Period Weeks   Status New   PT LONG TERM GOAL #5   Title Patient to be able to maintain SLS on foam pad for at least 20 seconds each lower extremity to evidence improved stability and reduced fall risk    Time 8   Period Weeks   Status New               Plan - 09/12/15 1701    Clinical Impression Statement Continued to focus  on functional strengthening as well as navigation of obstacle courses and overall gait mechanics today. Introduced gait with red TB strapping to mimic AFO with improvemetns in gait immediately noted although patient did require cues during gait.  Continues to require cues to stay on task. More verbal and interactive with PT today overall.    Pt will benefit from skilled therapeutic intervention in order to improve on the following deficits Abnormal gait;Decreased endurance;Decreased activity tolerance;Decreased strength;Decreased balance;Decreased mobility;Difficulty walking;Impaired vision/preception;Impaired perceived functional ability;Decreased coordination;Decreased safety awareness;Postural dysfunction   Rehab Potential Good   PT Frequency Other (comment)   PT Duration Other (comment)   PT Treatment/Interventions ADLs/Self Care Home Management;DME Instruction;Gait training;Stair training;Functional mobility training;Therapeutic activities;Therapeutic exercise;Balance training;Neuromuscular re-education;Patient/family education;Manual techniques;Energy conservation;Visual/perceptual remediation/compensation   PT Next Visit Plan G-code due next sessino. Continue with gait and balance training; continue working with therband straps to improve ankle DF during balance and gait tasks    PT Home Exercise Plan no changes    Consulted and Agree with Plan of Care Patient        Problem List Patient Active Problem List   Diagnosis Date Noted  . 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 Right hemiparesis 04/04/2015  . 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 Aortic thrombus   . Stroke with cerebral ischemia   . 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 DM (diabetes mellitus) 03/23/2014  . Osteoporosis, unspecified 03/23/2014  . Dyslipidemia 03/23/2014  . Tobacco abuse 03/23/2014  .  Syncope 01/01/2014  . Essential hypertension, benign 01/01/2014    Deniece Ree PT, DPT [phone]  [place] [address] [place], [place], [address] Phone: [phone]   Fax:  [phone]

## 2015-09-13 ENCOUNTER — Ambulatory Visit (INDEPENDENT_AMBULATORY_CARE_PROVIDER_SITE_OTHER): Payer: Medicare HMO

## 2015-09-14 ENCOUNTER — Ambulatory Visit (HOSPITAL_COMMUNITY): Payer: Medicare HMO | Admitting: Physical Therapy

## 2015-09-14 DIAGNOSIS — R262 Difficulty in walking, not elsewhere classified: Secondary | ICD-10-CM

## 2015-09-14 DIAGNOSIS — R2681 Unsteadiness on feet: Secondary | ICD-10-CM

## 2015-09-14 DIAGNOSIS — R531 Weakness: Secondary | ICD-10-CM

## 2015-09-14 DIAGNOSIS — Z9181 History of falling: Secondary | ICD-10-CM

## 2015-09-14 DIAGNOSIS — I63412 Cerebral infarction due to embolism of left middle cerebral artery: Secondary | ICD-10-CM | POA: Diagnosis not present

## 2015-09-14 NOTE — Therapy (Signed)
[place], [place], [address] Phone: [phone]   Fax:  [phone]  Physical Therapy Treatment  Patient Details  Name: Laura Melendez MRN: [id number] Date of Birth: Oct 21, 1941 Referring Provider:  Phillips Odor, MD  Encounter Date: 09/14/2015      PT End of Session - 09/14/15 1553    Visit Number 10   Number of Visits 20   Date for PT Re-Evaluation 10/14/15   Authorization Type [employer] PPO    Authorization Time Period 08/23/15 to 10/23/15- gcodes done 10th visit   Authorization - Visit Number 10   Authorization - Number of Visits 20   PT Start Time 4235   PT Stop Time 1430   PT Time Calculation (min) 41 min   Equipment Utilized During Treatment Gait belt   Activity Tolerance Patient tolerated treatment well   Behavior During Therapy [facility] for tasks assessed/performed      Past Medical History  Diagnosis Date  . Essential hypertension, benign   . Type 2 diabetes mellitus   . Osteoporosis 06/2008  . Stroke     Past Surgical History  Procedure Laterality Date  . Ankle surgery Left 2004    otif  . Abdominal hysterectomy    . Anterior and posterior repair N/A 03/11/2013    Procedure: ANTERIOR (CYSTOCELE) AND POSTERIOR REPAIR (RECTOCELE);  Surgeon: Linda Hedges, DO;  Location: [facility];  Service: Gynecology;  Laterality: N/A;  with sacrospinous ligament fixation bilateral  . Colonoscopy  2009    negative  . Cataract extraction w/phaco Right 12/27/2014    Procedure: CATARACT EXTRACTION PHACO AND INTRAOCULAR LENS PLACEMENT RIGHT EYE;  Surgeon: Tonny Branch, MD;  Location: [facility];  Service: Ophthalmology;  Laterality: Right;  CDE:10.63  . Cataract extraction w/phaco Left 01/13/2015    Procedure: CATARACT EXTRACTION PHACO AND INTRAOCULAR LENS PLACEMENT LEFT EYE;  Surgeon: Tonny Branch, MD;  Location: [facility];  Service: Ophthalmology;  Laterality: Left;  CDE:18.87  . Cholecystectomy    . Tee without  cardioversion N/A 04/01/2015    Procedure: TRANSESOPHAGEAL ECHOCARDIOGRAM (TEE);  Surgeon: Sanda Klein, MD;  Location: [facility];  Service: Cardiovascular;  Laterality: N/A;    There were no vitals filed for this visit.  Visit Diagnosis:  Difficulty walking  Weakness generalized  At high risk for falls  Unsteadiness      Subjective Assessment - 09/14/15 1353    Subjective Pt reports that she has noticed improvements in her walking, has not fallen in the past 2 weeks. Pt reports that she still has difficulty with getting up and down from her couch, and she feels that her balance is still an issue.    Currently in Pain? No/denies            [facility] PT Assessment - 09/14/15 0001    Observation/Other Assessments   Focus on Therapeutic Outcomes (FOTO)  60% limited   ROM / Strength   AROM / PROM / Strength Strength   Strength   Right/Left Hip Right;Left   Right Hip Flexion 3/5   Right Hip Extension 3-/5   Right Hip ABduction 3-/5   Left Hip Flexion 4-/5   Left Hip Extension 3-/5   Left Hip ABduction 3/5   Right Knee Flexion 3/5   Right Knee Extension 3+/5   Left Knee Flexion 4/5   Left Knee Extension 4-/5   Right Ankle Dorsiflexion 4/5   Left Ankle Dorsiflexion 4+/5   Bed Mobility   Bed Mobility  Rolling Right;Rolling Left;Supine to Sit   Rolling Right 6: Modified independent (Device/Increase time)   Rolling Left 6: Modified independent (Device/Increase time)   Supine to Sit 5: Supervision  cues for technique   Transfers   Sit to Stand 5: Supervision   Stand to Sit 5: Supervision   Ambulation/Gait   Ambulation/Gait Yes   Ambulation/Gait Assistance 4: Min guard   Ambulation Distance (Feet) 225 Feet   Assistive device None   Gait Pattern Step-to pattern;Step-through pattern;Decreased arm swing - right;Decreased arm swing - left;Decreased stride length;Decreased dorsiflexion - right;Decreased dorsiflexion - left;Festinating   Gait Comments used red therband  strapping to mimic AFO with improvements in gait speed, stride length, overall balance noted although patient did require cues for appropriate pacing and safety    6 minute walk test results    Endurance additional comments 3 minute walk test 300'   Timed Up and Go Test   TUG Comments 18" average                     [facility] Adult PT Treatment/Exercise - 09/14/15 0001    Knee/Hip Exercises: Standing   Other Standing Knee Exercises cone rotation on airex                PT Education - 09/14/15 1553    Education provided Yes   Education Details Goals and progress reviewed   Person(s) Educated Patient;Spouse   Methods Explanation   Comprehension Verbalized understanding;Returned demonstration          PT Short Term Goals - 09/14/15 1604    PT SHORT TERM GOAL #1   Title Patient will be able to perform functional bed mobility tasks, including supine<->sit and rolling with distant supervision, occasional cues for safety/sequencing    Baseline 9/28- pt requires close supervision and cueing for supine to sit   Time 4   Period Weeks   Status On-going   PT SHORT TERM GOAL #2   Title Patient will be able to perform sit to stand from low surfaces with distance supervision and only occasional cues for safety/sequencing    Time 4   Period Weeks   Status On-going   PT SHORT TERM GOAL #3   Title Patient will be able to perform TUG in 13 seconds or less on a consistent basis    Baseline 9/28- TUG 18 seconds   Time 4   Period Weeks   Status On-going   PT SHORT TERM GOAL #4   Title Patient will be able to ambulate at least 523ft during 3 minute walk test with only minimal unsteadiness and good balance self-correction strategies    Baseline 9/28- 300 ft   Time 4   Period Weeks   Status On-going   PT SHORT TERM GOAL #5   Title Patient and her spouse will be independent in correctly and consistently performing appropriate HEP, to be updated PRN    Time 4   Period Weeks    Status On-going           PT Long Term Goals - 09/14/15 1606    PT LONG TERM GOAL #1   Title Patient will demonstrate at least 4+/5 strength in bilateral lower extremities and at least 3+/5 strength in proximal musculature    Time 8   Period Weeks   Status On-going   PT LONG TERM GOAL #2   Title Patient will demonstrate good safety awareness at least 80% of the time when navigating through  obstacles and in functional scenarios, to be evidenced by performance of functional obstacle course    Time 8   Period Weeks   Status On-going   PT LONG TERM GOAL #3   Title Patient and her spouse will report no falls within the past 4 weeks, and patient willl also demonstrate a score of no more than 11 on TUG    Time 8   Period Weeks   Status On-going   PT LONG TERM GOAL #4   Title Patient will be able to ambulate at least 1068ft during 6 minute walk test with no rest breaks, minimal unsteadiness and good balance self-correction strategies    Time 8   Period Weeks   Status On-going   PT LONG TERM GOAL #5   Title Patient to be able to maintain SLS on foam pad for at least 20 seconds each lower extremity to evidence improved stability and reduced fall risk    Time 8   Period Weeks   Status On-going               Plan - September 17, 2015 1555    Clinical Impression Statement Reasessment was completed today. Pt has made improvements in her LE strength and bed mobility, however, she still demonstrates impairments in gait, balance, and functional activity tolerance. Pt began to demonstrate festinating gait in today's treatment after ambulating 50 feet, requiring cueing verbally and visually for improved foot clearance, proper heel strike, and to decrease gait speed. Pt required min A from PT to prevent falling forward on 3 occasions during 225 bout of ambulation. Red theraband was used again today to improve dorsiflexion and swing phase, and pt showed improved ambulation with the assistance of the  band. Pt continues to demonstrate decreased balance in standing, but was able to complete cone rotation on airex outside of // bars today to prevent pt from compensating with UE on // bars. Pt will benefit from continued skilled physical therapy to further address her gait and balance impairments to decrease fall risk.    PT Next Visit Plan Continue with gait training with theraband straps to improve DF, continue with balance training.           G-Codes - Sep 17, 2015 1606    Functional Assessment Tool Used FOTO   Functional Limitation Mobility: Walking and moving around   Mobility: Walking and Moving Around Current Status [phone]) At least 60 percent but less than 80 percent impaired, limited or restricted   Mobility: Walking and Moving Around Goal Status [phone]) At least 60 percent but less than 80 percent impaired, limited or restricted      Problem List Patient Active Problem List   Diagnosis Date Noted  . 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 Right hemiparesis 04/04/2015  . 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 Aortic thrombus   . Stroke with cerebral ischemia   . 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 DM (diabetes mellitus) 03/23/2014  . Osteoporosis, unspecified 03/23/2014  . Dyslipidemia 03/23/2014  . Tobacco abuse 03/23/2014  . Syncope 01/01/2014  . Essential hypertension, benign 01/01/2014     Physical Therapy Progress Note  Dates of Reporting Period: 08/23/15 to Sep 17, 2015  Objective Reports of Subjective Statement: Pt demonstrates improvements in LE strength, but continues to show deficits in gait, balance, and functional activity tolerance.   Objective Measurements: see above  Goal Update: see  above  Plan: Continue with gait and balance training to decrease fall risk.     Reason Skilled Services are Required: Pt will benefit from continued skilled  physical therapy to further address her strength, gait, and balance impairments to decrease fall risk and return pt to optimal level of function.    Hilma Favors, PT, DPT [phone] 09/14/2015, 4:12 PM  [place] [address] [place], [place], [address] Phone: [phone]   Fax:  [phone]

## 2015-09-15 ENCOUNTER — Ambulatory Visit: Payer: Medicare HMO | Admitting: Neurology

## 2015-09-15 ENCOUNTER — Other Ambulatory Visit: Payer: Self-pay | Admitting: Family Medicine

## 2015-09-15 ENCOUNTER — Ambulatory Visit: Payer: Self-pay | Admitting: Neurology

## 2015-09-16 ENCOUNTER — Ambulatory Visit (HOSPITAL_COMMUNITY): Payer: Medicare HMO

## 2015-09-16 DIAGNOSIS — R2681 Unsteadiness on feet: Secondary | ICD-10-CM

## 2015-09-16 DIAGNOSIS — R262 Difficulty in walking, not elsewhere classified: Secondary | ICD-10-CM

## 2015-09-16 DIAGNOSIS — I63412 Cerebral infarction due to embolism of left middle cerebral artery: Secondary | ICD-10-CM | POA: Diagnosis not present

## 2015-09-16 DIAGNOSIS — R531 Weakness: Secondary | ICD-10-CM

## 2015-09-16 DIAGNOSIS — Z9181 History of falling: Secondary | ICD-10-CM

## 2015-09-16 DIAGNOSIS — Z7409 Other reduced mobility: Secondary | ICD-10-CM

## 2015-09-16 NOTE — Therapy (Signed)
[place], [place], [address] Phone: [phone]   Fax:  [phone]  Physical Therapy Treatment  Patient Details  Name: Laura Melendez MRN: [id number] Date of Birth: [birth year] Referring Provider:  Phillips Odor, MD  Encounter Date: 09/16/2015      PT End of Session - 09/16/15 1419    Visit Number 11   Number of Visits 20   Date for PT Re-Evaluation 10/14/15   Authorization Type [employer] Medicare PPO    Authorization Time Period 08/23/15 to 10/23/15- gcodes done 10th visit   Authorization - Visit Number 11   Authorization - Number of Visits 20   PT Start Time 3875   PT Stop Time 1436   PT Time Calculation (min) 44 min   Equipment Utilized During Treatment Gait belt   Activity Tolerance Patient tolerated treatment well   Behavior During Therapy [facility] for tasks assessed/performed      Past Medical History  Diagnosis Date  . Essential hypertension, benign   . Type 2 diabetes mellitus   . Osteoporosis 06/2008  . Stroke     Past Surgical History  Procedure Laterality Date  . Ankle surgery Left 2004    otif  . Abdominal hysterectomy    . Anterior and posterior repair N/A 03/11/2013    Procedure: ANTERIOR (CYSTOCELE) AND POSTERIOR REPAIR (RECTOCELE);  Surgeon: Linda Hedges, DO;  Location: [facility];  Service: Gynecology;  Laterality: N/A;  with sacrospinous ligament fixation bilateral  . Colonoscopy  2009    negative  . Cataract extraction w/phaco Right 12/27/2014    Procedure: CATARACT EXTRACTION PHACO AND INTRAOCULAR LENS PLACEMENT RIGHT EYE;  Surgeon: Tonny Branch, MD;  Location: [facility];  Service: Ophthalmology;  Laterality: Right;  CDE:10.63  . Cataract extraction w/phaco Left 01/13/2015    Procedure: CATARACT EXTRACTION PHACO AND INTRAOCULAR LENS PLACEMENT LEFT EYE;  Surgeon: Tonny Branch, MD;  Location: [facility];  Service: Ophthalmology;  Laterality: Left;  CDE:18.87  . Cholecystectomy    . Tee without  cardioversion N/A 04/01/2015    Procedure: TRANSESOPHAGEAL ECHOCARDIOGRAM (TEE);  Surgeon: Sanda Klein, MD;  Location: [facility];  Service: Cardiovascular;  Laterality: N/A;    There were no vitals filed for this visit.  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 No/denies           [facility] Adult PT Treatment/Exercise - 09/16/15 0001    Knee/Hip Exercises: Standing   Rocker Board 2 minutes   Rocker Board Limitations R/L   Gait Training Gait training with RTB for DF 552 feet with cueing for posture, heel to toe, and increase stride length   Other Standing Knee Exercises cone rotation on airex   Knee/Hip Exercises: Supine   Bridges 20 reps   Knee/Hip Exercises: Sidelying   Hip ABduction 10 reps;AAROM              PT Short Term Goals - 09/14/15 1604    PT SHORT TERM GOAL #1   Title Patient will be able to perform functional bed mobility tasks, including supine<->sit and rolling with distant supervision, occasional cues for safety/sequencing    Baseline 9/28- pt  requires close supervision and cueing for supine to sit   Time 4   Period Weeks   Status On-going   PT SHORT TERM GOAL #2   Title Patient will be able to perform sit to stand from low surfaces with distance supervision and only occasional cues for safety/sequencing    Time 4   Period Weeks   Status On-going   PT SHORT TERM GOAL #3   Title Patient will be able to perform TUG in 13 seconds or less on a consistent basis    Baseline 9/28- TUG 18 seconds   Time 4   Period Weeks   Status On-going   PT SHORT TERM GOAL #4   Title Patient will be able to ambulate at least 576ft  during 3 minute walk test with only minimal unsteadiness and good balance self-correction strategies    Baseline 9/28- 300 ft   Time 4   Period Weeks   Status On-going   PT SHORT TERM GOAL #5   Title Patient and her spouse will be independent in correctly and consistently performing appropriate HEP, to be updated PRN    Time 4   Period Weeks   Status On-going           PT Long Term Goals - 09/14/15 1606    PT LONG TERM GOAL #1   Title Patient will demonstrate at least 4+/5 strength in bilateral lower extremities and at least 3+/5 strength in proximal musculature    Time 8   Period Weeks   Status On-going   PT LONG TERM GOAL #2   Title Patient will demonstrate good safety awareness at least 80% of the time when navigating through obstacles and in functional scenarios, to be evidenced by performance of functional obstacle course    Time 8   Period Weeks   Status On-going   PT LONG TERM GOAL #3   Title Patient and her spouse will report no falls within the past 4 weeks, and patient willl also demonstrate a score of no more than 11 on TUG    Time 8   Period Weeks   Status On-going   PT LONG TERM GOAL #4   Title Patient will be able to ambulate at least 1031ft during 6 minute walk test with no rest breaks, minimal unsteadiness and good balance self-correction strategies    Time 8   Period Weeks   Status On-going   PT LONG TERM GOAL #5   Title Patient to be able to maintain SLS on foam pad for at least 20 seconds each lower extremity to evidence improved stability and reduced fall risk    Time 8   Period Weeks   Status On-going               Plan - 09/16/15 1559    Clinical Impression Statement Session focus on improving LE strengthening to improve gait mechanics and balance training.  Utilized theraband straps to assist with dorsiflexion during gait training requiring verbal and visual cueing to improve foot clearning, proper heel strike and to decrease gait speed to  reduce shuffled gait.  Min A required during gait and balance training this session to reduce risk of falls.  Pt limited by fatigue at end of session, no reports of pain.  Pt stated she has not been completeing HEP exercises outside of walking, pt and her husband explained benefits of completeing the exercises for strengthening and encouraged to increase frequency.   PT Next Visit Plan Continue  with gait training with theraband straps to improve DF, continue with balance training.         Problem List Patient Active Problem List   Diagnosis Date Noted  . 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 Right hemiparesis 04/04/2015  . 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 Aortic thrombus   . Stroke with cerebral ischemia   . 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 DM (diabetes mellitus) 03/23/2014  . Osteoporosis, unspecified 03/23/2014  . Dyslipidemia 03/23/2014  . Tobacco abuse 03/23/2014  . Syncope 01/01/2014  . Essential hypertension, benign 01/01/2014   Ihor Austin, [place]; [place]  Aldona Lento 09/16/2015, 4:16 PM  [facility] [address] [place], [place], [address] Phone: [phone]   Fax:  [phone]

## 2015-09-19 ENCOUNTER — Ambulatory Visit (HOSPITAL_COMMUNITY): Payer: Self-pay | Admitting: Physical Therapy

## 2015-09-21 ENCOUNTER — Ambulatory Visit (HOSPITAL_COMMUNITY): Payer: Self-pay | Admitting: Physical Therapy

## 2015-09-26 ENCOUNTER — Ambulatory Visit (HOSPITAL_COMMUNITY): Payer: Medicare HMO | Attending: Physical Medicine & Rehabilitation | Admitting: Physical Therapy

## 2015-09-26 ENCOUNTER — Telehealth (HOSPITAL_COMMUNITY): Payer: Self-pay | Admitting: Physical Therapy

## 2015-09-26 DIAGNOSIS — R1310 Dysphagia, unspecified: Secondary | ICD-10-CM | POA: Insufficient documentation

## 2015-09-26 NOTE — Telephone Encounter (Signed)
Left message regarding missed appointment and requested to call clinic.

## 2015-09-28 ENCOUNTER — Ambulatory Visit (HOSPITAL_COMMUNITY): Payer: Self-pay | Admitting: Physical Therapy

## 2015-09-30 ENCOUNTER — Telehealth (HOSPITAL_COMMUNITY): Payer: Self-pay | Admitting: Physical Therapy

## 2015-09-30 NOTE — Telephone Encounter (Signed)
She is still in hospital after surgery and when they d/c her she will most likely go to [facility] per her Husband 09/30/15.

## 2015-10-03 ENCOUNTER — Ambulatory Visit (HOSPITAL_COMMUNITY): Payer: Self-pay | Admitting: Physical Therapy

## 2015-10-05 ENCOUNTER — Ambulatory Visit (HOSPITAL_COMMUNITY): Payer: Self-pay

## 2015-10-05 ENCOUNTER — Telehealth: Payer: Self-pay | Admitting: Family Medicine

## 2015-10-05 NOTE — Telephone Encounter (Signed)
pts spouse returned your call and asked that you call him back on his cell phone at  [phone] please

## 2015-10-08 ENCOUNTER — Emergency Department (HOSPITAL_COMMUNITY): Payer: Medicare HMO

## 2015-10-08 ENCOUNTER — Encounter (HOSPITAL_COMMUNITY): Payer: Self-pay | Admitting: *Deleted

## 2015-10-08 ENCOUNTER — Other Ambulatory Visit (HOSPITAL_COMMUNITY)
Admission: RE | Admit: 2015-10-08 | Discharge: 2015-10-08 | Disposition: A | Discharge status: Home / Self Care | Payer: Medicare HMO | Source: Skilled Nursing Facility | Attending: Internal Medicine | Admitting: Internal Medicine

## 2015-10-08 ENCOUNTER — Other Ambulatory Visit (HOSPITAL_COMMUNITY): Payer: Self-pay

## 2015-10-08 ENCOUNTER — Other Ambulatory Visit: Payer: Self-pay

## 2015-10-08 ENCOUNTER — Inpatient Hospital Stay
Admission: RE | Admit: 2015-10-08 | Discharge: 2015-10-17 | Disposition: A | Discharge status: Nursing Home (Includes ICF) | Payer: Medicare HMO | Source: Ambulatory Visit | Attending: Internal Medicine | Admitting: Internal Medicine

## 2015-10-08 ENCOUNTER — Emergency Department (HOSPITAL_COMMUNITY)
Admission: EM | Admit: 2015-10-08 | Discharge: 2015-10-08 | Disposition: A | Discharge status: Home / Self Care | Payer: Medicare HMO | Attending: Emergency Medicine | Admitting: Emergency Medicine

## 2015-10-08 DIAGNOSIS — Z8739 Personal history of other diseases of the musculoskeletal system and connective tissue: Secondary | ICD-10-CM | POA: Diagnosis not present

## 2015-10-08 DIAGNOSIS — N390 Urinary tract infection, site not specified: Secondary | ICD-10-CM | POA: Diagnosis not present

## 2015-10-08 DIAGNOSIS — Z8673 Personal history of transient ischemic attack (TIA), and cerebral infarction without residual deficits: Secondary | ICD-10-CM | POA: Insufficient documentation

## 2015-10-08 DIAGNOSIS — I10 Essential (primary) hypertension: Secondary | ICD-10-CM | POA: Insufficient documentation

## 2015-10-08 DIAGNOSIS — E119 Type 2 diabetes mellitus without complications: Secondary | ICD-10-CM | POA: Diagnosis not present

## 2015-10-08 DIAGNOSIS — R112 Nausea with vomiting, unspecified: Secondary | ICD-10-CM | POA: Diagnosis present

## 2015-10-08 DIAGNOSIS — Z79899 Other long term (current) drug therapy: Secondary | ICD-10-CM | POA: Insufficient documentation

## 2015-10-08 DIAGNOSIS — Z87891 Personal history of nicotine dependence: Secondary | ICD-10-CM | POA: Insufficient documentation

## 2015-10-08 DIAGNOSIS — Z7901 Long term (current) use of anticoagulants: Secondary | ICD-10-CM | POA: Insufficient documentation

## 2015-10-08 DIAGNOSIS — J690 Pneumonitis due to inhalation of food and vomit: Secondary | ICD-10-CM

## 2015-10-08 DIAGNOSIS — R1310 Dysphagia, unspecified: Principal | ICD-10-CM

## 2015-10-08 DIAGNOSIS — I639 Cerebral infarction, unspecified: Secondary | ICD-10-CM

## 2015-10-08 LAB — CBC WITH DIFFERENTIAL/PLATELET
BASOS PCT: 0 %
Basophils Absolute: 0 10*3/uL (ref 0.0–0.1)
Eosinophils Absolute: 0 10*3/uL (ref 0.0–0.7)
Eosinophils Relative: 0 %
HEMATOCRIT: 36.6 % (ref 36.0–46.0)
HEMOGLOBIN: 11.8 g/dL — AB (ref 12.0–15.0)
LYMPHS ABS: 0.8 10*3/uL (ref 0.7–4.0)
LYMPHS PCT: 8 %
MCH: 26.8 pg (ref 26.0–34.0)
MCHC: 32.2 g/dL (ref 30.0–36.0)
MCV: 83.2 fL (ref 78.0–100.0)
MONO ABS: 0.6 10*3/uL (ref 0.1–1.0)
MONOS PCT: 6 %
NEUTROS ABS: 9.2 10*3/uL — AB (ref 1.7–7.7)
NEUTROS PCT: 86 %
Platelets: 408 10*3/uL — ABNORMAL HIGH (ref 150–400)
RBC: 4.4 MIL/uL (ref 3.87–5.11)
RDW: 14.7 % (ref 11.5–15.5)
WBC: 10.7 10*3/uL — ABNORMAL HIGH (ref 4.0–10.5)

## 2015-10-08 LAB — COMPREHENSIVE METABOLIC PANEL
ALBUMIN: 2.7 g/dL — AB (ref 3.5–5.0)
ALBUMIN: 2.8 g/dL — AB (ref 3.5–5.0)
ALT: 17 U/L (ref 14–54)
ALT: 18 U/L (ref 14–54)
ANION GAP: 9 (ref 5–15)
AST: 22 U/L (ref 15–41)
AST: 25 U/L (ref 15–41)
Alkaline Phosphatase: 55 U/L (ref 38–126)
Alkaline Phosphatase: 53 U/L (ref 38–126)
Anion gap: 11 (ref 5–15)
BILIRUBIN TOTAL: 0.8 mg/dL (ref 0.3–1.2)
BUN: 27 mg/dL — AB (ref 6–20)
BUN: 28 mg/dL — ABNORMAL HIGH (ref 6–20)
CALCIUM: 8.9 mg/dL (ref 8.9–10.3)
CHLORIDE: 100 mmol/L — AB (ref 101–111)
CHLORIDE: 101 mmol/L (ref 101–111)
CO2: 27 mmol/L (ref 22–32)
CO2: 25 mmol/L (ref 22–32)
CREATININE: 0.59 mg/dL (ref 0.44–1.00)
Calcium: 9 mg/dL (ref 8.9–10.3)
Creatinine, Ser: 0.56 mg/dL (ref 0.44–1.00)
GFR calc non Af Amer: >60 mL/min (ref >60)
GLUCOSE: 215 mg/dL — AB (ref 65–99)
Glucose, Bld: 178 mg/dL — ABNORMAL HIGH (ref 65–99)
POTASSIUM: 4.6 mmol/L (ref 3.5–5.1)
POTASSIUM: 4.1 mmol/L (ref 3.5–5.1)
SODIUM: 138 mmol/L (ref 135–145)
SODIUM: 135 mmol/L (ref 135–145)
TOTAL PROTEIN: 6.4 g/dL — AB (ref 6.5–8.1)
Total Bilirubin: 0.5 mg/dL (ref 0.3–1.2)
Total Protein: 6.9 g/dL (ref 6.5–8.1)

## 2015-10-08 LAB — CBC
HCT: 36.1 % (ref 36.0–46.0)
Hemoglobin: 11.8 g/dL — ABNORMAL LOW (ref 12.0–15.0)
MCH: 26.9 pg (ref 26.0–34.0)
MCHC: 32.7 g/dL (ref 30.0–36.0)
MCV: 82.4 fL (ref 78.0–100.0)
PLATELETS: 435 10*3/uL — AB (ref 150–400)
RBC: 4.38 MIL/uL (ref 3.87–5.11)
RDW: 14.7 % (ref 11.5–15.5)
WBC: 12.8 10*3/uL — ABNORMAL HIGH (ref 4.0–10.5)

## 2015-10-08 LAB — URINE MICROSCOPIC-ADD ON

## 2015-10-08 LAB — PROTIME-INR
INR: 1.29 (ref 0.00–1.49)
INR: 1.3 (ref 0.00–1.49)
PROTHROMBIN TIME: 16.3 s — AB (ref 11.6–15.2)
PROTHROMBIN TIME: 16.4 s — AB (ref 11.6–15.2)

## 2015-10-08 LAB — URINALYSIS, ROUTINE W REFLEX MICROSCOPIC
BILIRUBIN URINE: NEGATIVE
Glucose, UA: NEGATIVE mg/dL
KETONES UR: NEGATIVE mg/dL
NITRITE: NEGATIVE
PH: 5.5 (ref 5.0–8.0)
PROTEIN: NEGATIVE mg/dL
Specific Gravity, Urine: 1.025 (ref 1.005–1.030)
UROBILINOGEN UA: 0.2 mg/dL (ref 0.0–1.0)

## 2015-10-08 LAB — C DIFFICILE QUICK SCREEN W PCR REFLEX
C DIFFICLE (CDIFF) ANTIGEN: NEGATIVE
C Diff interpretation: NEGATIVE
C Diff toxin: NEGATIVE

## 2015-10-08 LAB — LIPASE, BLOOD: Lipase: 33 U/L (ref 11–51)

## 2015-10-08 MED ORDER — CEPHALEXIN 500 MG PO CAPS: 500.0000 mg | ORAL_CAPSULE | Freq: Four times a day (QID) | ORAL | Status: DC

## 2015-10-08 MED ORDER — ONDANSETRON 4 MG PO TBDP: 4.0000 mg | ORAL_TABLET | Freq: Three times a day (TID) | ORAL | Status: DC | PRN

## 2015-10-08 MED ORDER — DEXTROSE 5 % IV SOLN
1.0000 g | Freq: Once | INTRAVENOUS | Status: AC
  Administered 2015-10-08: 1 g via INTRAVENOUS
  Filled 2015-10-08: qty 10

## 2015-10-08 MED ORDER — ONDANSETRON HCL 4 MG/2ML IJ SOLN
4.0000 mg | Freq: Once | INTRAMUSCULAR | Status: AC
  Administered 2015-10-08: 4 mg via INTRAVENOUS
  Filled 2015-10-08: qty 2

## 2015-10-08 MED ORDER — METOCLOPRAMIDE HCL 5 MG/ML IJ SOLN
10.0000 mg | Freq: Once | INTRAMUSCULAR | Status: AC
  Administered 2015-10-08: 10 mg via INTRAVENOUS
  Filled 2015-10-08: qty 2

## 2015-10-08 NOTE — Discharge Instructions (Signed)
Return to the ED with any concerns including vomiting and not able to tolerate tube feeds, abdominal pain, fever, difficulty breathing, decreased level of alertness/lethargy, or any other alarming symptoms

## 2015-10-08 NOTE — ED Notes (Signed)
MD at bedside. 

## 2015-10-08 NOTE — ED Provider Notes (Signed)
CSN: [id number]     Arrival date & time    History  By signing my name below, I, Laura Melendez, attest that this documentation has been prepared under the direction and in the presence of Laura Beers, MD. Electronically Signed: Soijett Melendez, ED Scribe. 10/08/2015. 8:41 AM.   Chief Complaint  Patient presents with  . Emesis     Patient is a 74 y.o. female presenting with vomiting. The history is provided by the spouse and a relative. No language interpreter was used.  Emesis Severity:  Moderate Duration:  1 day Timing:  Intermittent Quality:  Unable to specify Progression:  Unable to specify Chronicity:  New Context comment:  Vomiting following feeding through a feeding tube Relieved by:  None tried Worsened by:  Nothing tried Ineffective treatments:  None tried   HPI Comments: Laura Melendez is a 74 y.o. female with a medical hx of HTN, Type 2 DM, stroke, who presents to the Emergency Department complaining of emesis onset 10:30 AM. Pt is a pt at [facility] and the staff notes that the pt began to vomit the food that was given at her tube feeding this morning. Husband notes that the pt has had parotid ectomy surgery at [facility] to which she was intubated on 09/19/15. Pt had a hemorrhage the next day to where the surgeons went in to fix to which she was also intubated for. Following the hemorrhage surgery, the pt airway was compromised, so she was intubated again and placed on a ventilator to which she then developed pneumonia.  Daughter states that the pt was well enough to be transferred to [facility] last night and she was nausea during the transfer. Daughter is unsure if the pt began to feed before or after she got nausea medicine. Daughter is unsure if the pt vomited last night but she had to have her feeding tube aspirated following the vomiting episode PTA. Husband notes that the pt takes all of her medications through her feeding tube. She states that she is having associated  symptoms of nausea and vomiting. She denies any other symptoms.    Past Medical History  Diagnosis Date  . Essential hypertension, benign   . Type 2 diabetes mellitus ([place])   . Osteoporosis 06/2008  . Stroke [facility])    Past Surgical History  Procedure Laterality Date  . Ankle surgery Left 2004    otif  . Abdominal hysterectomy    . Anterior and posterior repair N/A 03/11/2013    Procedure: ANTERIOR (CYSTOCELE) AND POSTERIOR REPAIR (RECTOCELE);  Surgeon: Linda Hedges, DO;  Location: [facility];  Service: Gynecology;  Laterality: N/A;  with sacrospinous ligament fixation bilateral  . Colonoscopy  2009    negative  . Cataract extraction w/phaco Right 12/27/2014    Procedure: CATARACT EXTRACTION PHACO AND INTRAOCULAR LENS PLACEMENT RIGHT EYE;  Surgeon: Tonny Branch, MD;  Location: [facility];  Service: Ophthalmology;  Laterality: Right;  CDE:10.63  . Cataract extraction w/phaco Left 01/13/2015    Procedure: CATARACT EXTRACTION PHACO AND INTRAOCULAR LENS PLACEMENT LEFT EYE;  Surgeon: Tonny Branch, MD;  Location: [facility];  Service: Ophthalmology;  Laterality: Left;  CDE:18.87  . Cholecystectomy    . Tee without cardioversion N/A 04/01/2015    Procedure: TRANSESOPHAGEAL ECHOCARDIOGRAM (TEE);  Surgeon: Sanda Klein, MD;  Location: [facility] ENDOSCOPY;  Service: Cardiovascular;  Laterality: N/A;   Family History  Problem Relation Age of Onset  . Cancer Father     Liver  . Cancer Sister  Breast  . Diabetes Brother   . Dementia Mother    Social History  Substance Use Topics  . Smoking status: Former Smoker -- 0.25 packs/day for 30 years    Types: Cigarettes    Quit date: 02/27/2015  . Smokeless tobacco: None  . Alcohol Use: No   OB History    No data available     Review of Systems  Gastrointestinal: Positive for vomiting.  All other systems reviewed and are negative.     Allergies  Review of patient's allergies indicates no known allergies.  Home Medications   Prior to Admission  medications   Medication Sig Start Date End Date Taking? Authorizing Provider  alendronate (FOSAMAX) 70 MG tablet Take 1 tablet (70 mg total) by mouth every 7 (seven) days. 09/24/13  Yes [name], MD  enoxaparin (LOVENOX) 40 MG/0.4ML injection Inject 0.4 mLs (40 mg total) into the skin daily. 09/08/15  Yes [name], MD  FLUoxetine (PROZAC) 10 MG capsule TAKE 1 CAPSULE BY MOUTH DAILY FOR DEPRESSION/ANXIETY 09/15/15  Yes [name], MD  levETIRAcetam (KEPPRA) 750 MG tablet TAKE 1 TABLET BY MOUTH EVERY MORNING AND 2 TABLETS BY MOUTH EVERY NIGHT AT BEDTIME 08/01/15  Yes [name], MD  lisinopril (PRINIVIL,ZESTRIL) 2.5 MG tablet Take 1 tablet (2.5 mg total) by mouth daily. 07/12/15  Yes [name], MD  metFORMIN (GLUCOPHAGE) 500 MG tablet Take one half tablet BID Patient taking differently: Take 250 mg by mouth 2 (two) times daily with a meal.  04/25/15  Yes [name], MD  methylphenidate (RITALIN) 5 MG tablet Take 1 tablet (5 mg total) by mouth 2 (two) times daily with breakfast and lunch. At 0700 and 1200 daily 07/20/15  Yes [name], MD  metoprolol tartrate (LOPRESSOR) 25 MG tablet Take 25 mg by mouth 2 (two) times daily.   Yes Historical Provider, MD  NIFEdipine (PROCARDIA XL/ADALAT-CC) 60 MG 24 hr tablet Take 1 tablet (60 mg total) by mouth daily. DO NOT CRUSH 07/12/15  Yes [name], MD  pravastatin (PRAVACHOL) 40 MG tablet Take 1 tablet (40 mg total) by mouth daily. 07/20/15  Yes [name], MD  raloxifene (EVISTA) 60 MG tablet Take 1 tablet (60 mg total) by mouth daily. 07/12/15  Yes [name], MD  senna-docusate (SENOKOT-S) 8.6-50 MG per tablet Take 2 tablets by mouth 2 (two) times daily. For constipation 04/22/15  Yes Ivan Anchors Love, PA-C  warfarin (COUMADIN) 2 MG tablet Take 1/2 a pill on Friday evening and take a whole pill on all other days. Patient taking differently: Take 1-2 mg by mouth See admin instructions. Take 1 mg on Tues, Thurs, and Sat.  Take 2 mg on  Sun, Mon, Wed, and Fri. 07/12/15  Yes [name], MD  cephALEXin (KEFLEX) 500 MG capsule Take 1 capsule (500 mg total) by mouth 4 (four) times daily. 10/08/15   Laura Beers, MD  metoprolol (LOPRESSOR) 50 MG tablet Take 1 tablet (50 mg total) by mouth 2 (two) times daily. Patient not taking: Reported on 10/08/2015 08/26/15   [name], MD  ondansetron (ZOFRAN ODT) 4 MG disintegrating tablet Take 1 tablet (4 mg total) by mouth every 8 (eight) hours as needed for nausea or vomiting. 10/08/15   Laura Beers, MD   BP 163/80 mmHg  Pulse 91  Temp(Src) 97.9 F (36.6 C) (Oral)  Resp 24  Ht 5\' 2"  (1.575 m)  Wt 93 lb (42.185 kg)  BMI 17.01 kg/m2  SpO2 93%  Vitals reviewed Physical Exam  Physical Examination: General appearance - alert, chronically ill appearing, frail appearing, and in no distress Mental status - alert, oriented to person, place, and time Eyes - no conjunctival injection, no scleral icterus Mouth - mucous membranes moist, pharynx normal without lesions Neck - left sided open neck wound- healing by secondary intention, no surrounding erythema, good granulation tissue, no pus or exudate Chest - clear to auscultation, no wheezes, rales or rhonchi, symmetric air entry, some transmitted upper airway sound, normal respiratory effort Heart - normal rate, regular rhythm, normal S1, S2, no murmurs, rubs, clicks or gallops Abdomen - soft, nontender, nondistended, no masses or organomegaly, Gtube site appears intact, no surrounding erythema  Musculoskeletal - no joint tenderness, deformity or swelling Extremities - peripheral pulses normal, no pedal edema, no clubbing or cyanosis Skin - normal coloration and turgor, no rashes  ED Course  Procedures (including critical care time) DIAGNOSTIC STUDIES: Oxygen Saturation is 96% on RA, nl by my interpretation.    COORDINATION OF CARE: 8:40 AM Discussed treatment plan with pt at bedside which includes zofran, xray of abdomen with  chest, and labs and pt agreed to plan.    Labs Review Labs Reviewed  CBC - Abnormal; Notable for the following:    WBC 12.8 (*)    Hemoglobin 11.8 (*)    Platelets 435 (*)    All other components within normal limits  COMPREHENSIVE METABOLIC PANEL - Abnormal; Notable for the following:    Glucose, Bld 215 (*)    BUN 28 (*)    Albumin 2.8 (*)    All other components within normal limits  PROTIME-INR - Abnormal; Notable for the following:    Prothrombin Time 16.4 (*)    All other components within normal limits  URINALYSIS, ROUTINE W REFLEX MICROSCOPIC (NOT AT [facility]) - Abnormal; Notable for the following:    APPearance HAZY (*)    Hgb urine dipstick SMALL (*)    Leukocytes, UA SMALL (*)    All other components within normal limits  URINE MICROSCOPIC-ADD ON - Abnormal; Notable for the following:    Squamous Epithelial / LPF FEW (*)    Bacteria, UA FEW (*)    All other components within normal limits  C DIFFICILE QUICK SCREEN W PCR REFLEX  URINE CULTURE  LIPASE, BLOOD    Imaging Review Dg Abd Acute W/chest  10/08/2015  CLINICAL DATA:  Nausea and vomiting, recent parotid surgery with postoperative hemorrhage and pneumonia EXAM: DG ABDOMEN ACUTE W/ 1V CHEST COMPARISON:  01/30/2015 FINDINGS: Rotated to the RIGHT. Normal heart size, mediastinal contours, and pulmonary vascularity. Atherosclerotic calcification aorta. Lungs clear. No pleural effusion or pneumothorax. Gastrostomy tube LEFT upper quadrant. Scattered atherosclerotic calcifications. Normal bowel gas pattern. No bowel dilatation, bowel wall thickening, or free intraperitoneal air. Bones demineralized with levoconvex lumbar scoliosis. IMPRESSION: No acute abnormalities. Electronically Signed   By: Lavonia Dana M.D.   On: 10/08/2015 10:06   I have personally reviewed and evaluated these images and lab results as part of my medical decision-making.   EKG Interpretation None     ED ECG REPORT   Date: 10/08/2015  Rate:  93  Rhythm: sinus rhythm  QRS Axis: normal  Intervals: normal  ST/T Wave abnormalities: nonspecific T wave changes  Conduction Disutrbances:none  Narrative Interpretation:   Old EKG Reviewed: since previous tracing of may 2016 nonspecific t wave abnormalities are new    MDM   Final diagnoses:  Nausea and vomiting, vomiting of unspecified type  UTI (lower urinary tract infection)     I, Threasa Beards, personally performed the services described in this documentation. All medical record entries made by the scribe were at my direction and in my presence.  I have reviewed the chart and discharge instructions and agree that the record reflects my personal performance and is accurate and complete. Threasa Beards.  10/09/2015. 8:54 AM.    11:39 AM- d/w Dr. Caryn Section, [facility].  She states patient does not meet criteria for admission and that her condition can be managed on an outpatient basis at [facility].  She recommends urine be cultured and started on rocephin, discharged back to [facility] for po abx, ODT zofran.    11:55 AM updated family about plan, they are concerned that patient does need to be admitted.  I have discussed with them the plan for abx and q6 hour zofran at [facility].  They are concerned and would prefer for her to be admitted. I have tried to reassure them that we will try to manage her symptoms at [facility], that her vitals are stable here, no evidence of pneumonia or other acute abnormality.   Laura Beers, MD 10/09/15 [phone]

## 2015-10-08 NOTE — ED Notes (Signed)
Patient from [facility], staff reports patient vomiting tube feeding this morning. 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 Patient has a PEG tube, rectal tube and Foley.

## 2015-10-08 NOTE — ED Notes (Signed)
Patient's husband is at bedside to offer more information about patient.

## 2015-10-09 ENCOUNTER — Other Ambulatory Visit (HOSPITAL_COMMUNITY)
Admission: RE | Admit: 2015-10-09 | Discharge: 2015-10-09 | Disposition: A | Discharge status: Home / Self Care | Payer: Medicare HMO | Source: Skilled Nursing Facility | Attending: Internal Medicine | Admitting: Internal Medicine

## 2015-10-09 DIAGNOSIS — R790 Abnormal level of blood mineral: Secondary | ICD-10-CM | POA: Insufficient documentation

## 2015-10-10 ENCOUNTER — Non-Acute Institutional Stay (SKILLED_NURSING_FACILITY): Payer: Medicare HMO | Admitting: Internal Medicine

## 2015-10-10 ENCOUNTER — Ambulatory Visit (HOSPITAL_COMMUNITY): Payer: Self-pay | Admitting: Physical Therapy

## 2015-10-10 ENCOUNTER — Other Ambulatory Visit: Payer: Self-pay | Admitting: *Deleted

## 2015-10-10 DIAGNOSIS — I5020 Unspecified systolic (congestive) heart failure: Secondary | ICD-10-CM | POA: Diagnosis not present

## 2015-10-10 DIAGNOSIS — C07 Malignant neoplasm of parotid gland: Secondary | ICD-10-CM | POA: Diagnosis not present

## 2015-10-10 DIAGNOSIS — T8189XA Other complications of procedures, not elsewhere classified, initial encounter: Secondary | ICD-10-CM

## 2015-10-10 DIAGNOSIS — R197 Diarrhea, unspecified: Secondary | ICD-10-CM

## 2015-10-10 DIAGNOSIS — E46 Unspecified protein-calorie malnutrition: Secondary | ICD-10-CM | POA: Diagnosis not present

## 2015-10-10 DIAGNOSIS — E860 Dehydration: Secondary | ICD-10-CM | POA: Diagnosis not present

## 2015-10-10 DIAGNOSIS — R29898 Other symptoms and signs involving the musculoskeletal system: Secondary | ICD-10-CM

## 2015-10-10 DIAGNOSIS — I63412 Cerebral infarction due to embolism of left middle cerebral artery: Secondary | ICD-10-CM

## 2015-10-10 LAB — HEMOGLOBIN A1C
Hgb A1c MFr Bld: 6.5 % — ABNORMAL HIGH (ref 4.8–5.6)
Mean Plasma Glucose: 140 mg/dL

## 2015-10-10 MED ORDER — METHYLPHENIDATE HCL 5 MG PO TABS: ORAL_TABLET | ORAL | Status: DC

## 2015-10-10 NOTE — Telephone Encounter (Signed)
[facility] 

## 2015-10-10 NOTE — Progress Notes (Signed)
Patient ID: Laura Melendez, female   DOB: 10/13/1941, 74 y.o.   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 A fine-needle aspirate had been indeterminant I think the suspicion for a cancer was high. She went to the operating room where she underwent a left superficial parotidectomy with facial nerve dissection. On postoperative day one she developed a left neck hematoma and had to go back for surgical incision and evacuation on 10/4. The wound from the neck hematoma was left open and treated with wet to dry saline moist Kerlix dressings and the wounds incision to heal by secondary intention. On 10/4 she went into respiratory distress and required intubation on the floor. It was felt that she had reactive airway swelling in the setting of the hematoma and postoperative swelling. She further developed a right pneumothorax requiring a chest tube removed on 10/10. She was also excessively extubated on 10/10. She then developed a pneumonia with respiratory culture growing Escherichia coli and Klebsiella and she completed a 7 day course of antibiotics ending on 10/14. An x-ray on 10/16 showed pulmonary edema and she was diureses with Lasix. An echocardiogram showed no signs of congestive heart failure. Her respiratory distress improved with Lasix. According to the discharge summary she did not pass a swallowing evaluation to a PEG tube was placed by general surgery on 10/17. She had the study and was cleared for a pured diet. She was restarted on her home Coumadin. I am not completely certain why she is on Coumadin  Her family tells me that she had a stroke which sounded left sided affecting her right side in April of this year. I don't think she regained full ambulatory status. She did not use an ambulatory assist  device, I think her husband had to assist her around the home.  She is already been over to the ER since her arrival here. I don't exactly see what the issue was although I think it may have been predominantly family concerns. [facility]  Past Medical History  Diagnosis Date  . Essential hypertension, benign   . Type 2 diabetes mellitus ([place])   . Osteoporosis 06/2008  . Stroke [facility])     Past Surgical History  Procedure Laterality Date  . Ankle surgery Left 2004    otif  . Abdominal hysterectomy    . Anterior and posterior repair N/A 03/11/2013    Procedure: ANTERIOR (CYSTOCELE) AND POSTERIOR REPAIR (RECTOCELE);  Surgeon: Linda Hedges, DO;  Location: [facility];  Service: Gynecology;  Laterality: N/A;  with sacrospinous ligament fixation bilateral  . Colonoscopy  2009    negative  . Cataract extraction w/phaco Right 12/27/2014    Procedure: CATARACT EXTRACTION PHACO AND INTRAOCULAR LENS PLACEMENT RIGHT EYE;  Surgeon: Tonny Branch, MD;  Location: [facility];  Service: Ophthalmology;  Laterality: Right;  CDE:10.63  . Cataract extraction w/phaco Left 01/13/2015    Procedure: CATARACT EXTRACTION PHACO AND INTRAOCULAR LENS PLACEMENT LEFT EYE;  Surgeon: Tonny Branch, MD;  Location: [facility];  Service: Ophthalmology;  Laterality: Left;  CDE:18.87  . Cholecystectomy    . Tee without cardioversion N/A 04/01/2015    Procedure: TRANSESOPHAGEAL ECHOCARDIOGRAM (TEE);  Surgeon: Sanda Klein, MD;  Location: [facility];  Service: Cardiovascular;  Laterality: N/A;  medication Lovenox 40 daily Prozac 10 daily Keppra 750 in the morning and 1500 in the evening Lisinopril 2.5 daily Glucophage 250 in the morning and 250 in the evening Ritalin 5 mg at breakfast and lunch Metoprolol 25 twice a day Adalat 60 mg daily [extended release } Pravachol 40 daily Evista 60 daily Fosamax 70 weekly Senokot S2 tablets daily when necessary Coumadin 2 mg Sunday Monday Wednesday  and Friday and 1 mg Tuesday Thursday and Saturday  Social; patient lives in [place] with her husband in her own home. I am not completely certain of her functional status before this surgery however she was not using an ambulatory assist device. [facility] however as I understand things currently her health insurance does not contract with [facility] [[employer]}  reports that she quit smoking about 7 months ago. Her smoking use included Cigarettes. She has a 7.5 pack-year smoking history. She does not have any smokeless tobacco history on file. She reports that she does not drink alcohol or use illicit drugs.  family history includes Cancer in her father and sister; Dementia in her mother; Diabetes in her brother.  Review of systems Gen. patient feels weak and certainly looks depressed the. HEENT; she was cleared for a pured diet Respiratory; not complaining of shortness of breath Cardiac; no chest pain. I think she does have a history of atrial fibrillation and probably this is the reason why she is on Coumadin. She did require TEE guided cardioversion at some point GI no painful swallowing no spontaneous abdominal pain. She has a rectal tube. She is already been tested for C. Difficile. Family states that she had episodic explosive diarrhea even before this surgery GU she has a Foley catheter in but may not actually have a qualifying reason. Skin; she does not have any pressure ulcers but does have a rash on her buttock's and coccyx Neurologic; stroke in April left her with right-sided weakness Musculoskeletal; does not have any joint pain. There may be a history of back pain Endocrine she is a diabetic on oral agents.  Physical examination Gen. patient is awake can speak but does so in a soft voice. Looks depressed Vital signs; O2 sat is 92% on room air respirations 20 pulse rate 82 HEENT; her tongue is coated I  don't see any thrush. The surgical incision has some surface slough but not too bad. There is been some discussion yesterday about this being the sole source of an odor I don't think that's the case. I feel no lymphadenopathy in the cervical area clavicular or axilla Breasts no masses Respiratory shallow air entry bilaterally no crackles or wheezes Cardiac heart sounds regular. No gallops. Benign sounding 2/6 systolic murmur she appears to be somewhat dehydrated Abdomen; PEG site looks fine. She is diffusely tender no guarding or rebound. Cholecystectomy scar Rectal; she has a rectal tube in place with copious amounts of diarrhea-looking stool. As mentioned C. difficile is been negative. Extremities; muscles are wasted without fasciculations. There is no edema. Peripheral pulses are palpable. She has palmer erythema Neurologic; her left facial nerve seems intact. Extraocular movements are intact. She is diffusely weak barely antigravity proximally in the lower extremities. She has weakness and dorsi and plantar flexion. Reflexes are symmetric both toes are downgoing there is no Hoffman's reflex. I did not attempt to sitter up. She has no sensory level Mental status; there is no doubt there is depression here. 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 [See history of present illness]. #2 severe lower extremity weakness; I presume all of this is mostly disuse. Check a total CK on a statin #3 protein calorie malnutrition. She is cleared for a pured diet I've spoken to the speech therapist to get working on her as soon as possible #4 severe depression #5 probable some degree of dehydration. #6 congestive heart failure with pulmonary edema treated in the hospital 2 weeks ago I certainly see no evidence of this in fact she looks mostly dehydrated  at the bedside. Her echocardiogram was normal #7 has a Foley catheter in place which I'll leave for the moment however this eventually is going to need to come out #8 copious amounts of diarrhea which apparently were secondary to "tube feeding". The LAD fiber and probably loperamide. #9 fungal rash around her buttock which will need treatment that. #10 surgical wound nonhealing in the left parotid/neck area. I actually think this is the least of her problems. There was some concern from the nursing staff yesterday that this was a source of infection and/or odor I really don't see/smell this at all    BMP Latest Ref Rng 10/08/2015 10/08/2015 06/27/2015  Glucose 65 - 99 mg/dL 215(H) 178(H) -  BUN 6 - 20 mg/dL 28(H) 27(H) -  Creatinine 0.44 - 1.00 mg/dL 0.56 0.59 0.70  BUN/Creat Ratio 11 - 26 - - -  Sodium 135 - 145 mmol/L 135 138 -  Potassium 3.5 - 5.1 mmol/L 4.1 4.6 -  Chloride 101 - 111 mmol/L 101 100(L) -  CO2 22 - 32 mmol/L 25 27 -  Calcium 8.9 - 10.3 mg/dL 8.9 9.0 -   CBC Latest Ref Rng 10/08/2015 10/08/2015 04/18/2015  WBC 4.0 - 10.5 K/uL 12.8(H) 10.7(H) 8.7  Hemoglobin 12.0 - 15.0 g/dL 11.8(L) 11.8(L) 11.7(L)  Hematocrit 36.0 - 46.0 % 36.1 36.6 35.4(L)  Platelets 150 - 400 K/uL 435(H) 408(H) 237

## 2015-10-11 ENCOUNTER — Ambulatory Visit (HOSPITAL_COMMUNITY)
Admit: 2015-10-11 | Discharge: 2015-10-11 | Disposition: A | Discharge status: Home / Self Care | Payer: Medicare HMO | Attending: Internal Medicine | Admitting: Internal Medicine

## 2015-10-11 ENCOUNTER — Ambulatory Visit (HOSPITAL_COMMUNITY): Payer: Medicare HMO | Admitting: Speech Pathology

## 2015-10-11 DIAGNOSIS — R1310 Dysphagia, unspecified: Secondary | ICD-10-CM

## 2015-10-11 LAB — WOUND CULTURE: Gram Stain: NONE SEEN

## 2015-10-11 NOTE — Therapy (Deleted)
[place], [place], [address] Phone: [phone]   Fax:  [phone]  Speech Language Pathology Evaluation  Patient Details  Name: Laura Melendez MRN: [id number] Date of Birth: 1941/02/26 No Data Recorded  Encounter Date: 10/11/2015      End of Session - 10/11/15 1650    Visit Number 1   Number of Visits 1   Authorization Type [employer] medicare   SLP Start Time 7412   SLP Stop Time  1552   SLP Time Calculation (min) 19 min   Activity Tolerance Patient limited by fatigue      Past Medical History  Diagnosis Date  . Essential hypertension, benign   . Type 2 diabetes mellitus ([place])   . Osteoporosis 06/2008  . Stroke [facility])     Past Surgical History  Procedure Laterality Date  . Ankle surgery Left 2004    otif  . Abdominal hysterectomy    . Anterior and posterior repair N/A 03/11/2013    Procedure: ANTERIOR (CYSTOCELE) AND POSTERIOR REPAIR (RECTOCELE);  Surgeon: Linda Hedges, DO;  Location: [place] [facility];  Service: Gynecology;  Laterality: N/A;  with sacrospinous ligament fixation bilateral  . Colonoscopy  2009    negative  . Cataract extraction w/phaco Right 12/27/2014    Procedure: CATARACT EXTRACTION PHACO AND INTRAOCULAR LENS PLACEMENT RIGHT EYE;  Surgeon: Tonny Branch, MD;  Location: [facility];  Service: Ophthalmology;  Laterality: Right;  CDE:10.63  . Cataract extraction w/phaco Left 01/13/2015    Procedure: CATARACT EXTRACTION PHACO AND INTRAOCULAR LENS PLACEMENT LEFT EYE;  Surgeon: Tonny Branch, MD;  Location: [facility];  Service: Ophthalmology;  Laterality: Left;  CDE:18.87  . Cholecystectomy    . Tee without cardioversion N/A 04/01/2015    Procedure: TRANSESOPHAGEAL ECHOCARDIOGRAM (TEE);  Surgeon: Sanda Klein, MD;  Location: [facility] ENDOSCOPY;  Service: Cardiovascular;  Laterality: N/A;    There were no vitals filed for this visit.  Visit Diagnosis: Dysphagia      Subjective Assessment - 10/11/15 1619    Subjective Alert, pleasant upright in haustead chair, appears very weak   Currently in Pain? No/denies                                   Plan - 10/11/15 1655    Clinical Impression Statement Pt presents with moderate to severe sensory motor oropharygneal dysphagia of multi factorial etiology (post left superficial parotidectomy with facial nerve dissection, decreased respiratory support inclusive of 6 day intubation, extubation date noted 09-26-15, and generalized weakness including oral and pharyngeal musculature). Pt with weak lingual manipulation of all bolus consistencies leading to premature spillage and severe delay in swallow initiation. Aspiration evidenced with thin liquids via cup sip which was sensed but not effectively cleared by weak reflexive cough. Pt with oral residuals of thin liquids which later pentrated to the level of the true vocal cords, further compromising patients airway protection after the swallow. Pt with deep penetration of nectar thick liquids by teaspoon. No penetration or aspiration of honey thick liquids or puree consistencies. However larger honey thick liquid sip size yielded vallecular residuals. Recommend puree diet and honey thick liquids via teaspoon with SLP only and medicines via alternative means. Okay for treating SLP to clinically upgrade conservatively as tolerated given anticipated recovery. Pt is very high risk for aspiration given recent severity of surgical procedures and medical comorbidities further compromising strength and endurance.  G-Codes - 10/11/15 1651    Functional Assessment Tool Used MBSS; clinical judgement   Functional Limitations Swallowing   Swallow Current Status (E8315) At least 80 percent but less than 100 percent impaired, limited or restricted   Swallow Goal Status (V7616) At least 40 percent but less than 60 percent impaired, limited or restricted   Swallow Discharge Status [phone]) At least  80 percent but less than 100 percent impaired, limited or restricted      Problem List Patient Active Problem List   Diagnosis Date Noted  . 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 Right hemiparesis ([place]) 04/04/2015  . 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 Aortic thrombus ([place])   . Stroke with cerebral ischemia ([place])   . 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 DM (diabetes mellitus) ([place]) 03/23/2014  . Osteoporosis, unspecified 03/23/2014  . Dyslipidemia 03/23/2014  . Tobacco abuse 03/23/2014  . Syncope 01/01/2014  . Essential hypertension, benign 01/01/2014   Arvil Chaco MA, [place] [facility] Speech Language Pathologist    Levi Aland 10/11/2015, 5:22 PM  [place], [place], [address] Phone: [phone]   Fax:  [phone]  Name: Laura Melendez MRN: [id number] Date of Birth: January 13, 1941

## 2015-10-11 NOTE — Therapy (Signed)
[place], [place], [address] Phone: [phone]   Fax:  [phone]  Modified Barium Swallow  Patient Details  Name: Laura Melendez MRN: [id number] Date of Birth: 13-Aug-1941 No Data Recorded  Encounter Date: 10/11/2015      End of Session - 10/11/15 1650    Visit Number 1   Number of Visits 1   Authorization Type [employer] medicare   SLP Start Time 9024   SLP Stop Time  1552   SLP Time Calculation (min) 19 min   Activity Tolerance Patient limited by fatigue      Past Medical History  Diagnosis Date  . Essential hypertension, benign   . Type 2 diabetes mellitus ([place])   . Osteoporosis 06/2008  . Stroke [facility])     Past Surgical History  Procedure Laterality Date  . Ankle surgery Left 2004    otif  . Abdominal hysterectomy    . Anterior and posterior repair N/A 03/11/2013    Procedure: ANTERIOR (CYSTOCELE) AND POSTERIOR REPAIR (RECTOCELE);  Surgeon: Linda Hedges, DO;  Location: [place] [facility];  Service: Gynecology;  Laterality: N/A;  with sacrospinous ligament fixation bilateral  . Colonoscopy  2009    negative  . Cataract extraction w/phaco Right 12/27/2014    Procedure: CATARACT EXTRACTION PHACO AND INTRAOCULAR LENS PLACEMENT RIGHT EYE;  Surgeon: Tonny Branch, MD;  Location: [facility];  Service: Ophthalmology;  Laterality: Right;  CDE:10.63  . Cataract extraction w/phaco Left 01/13/2015    Procedure: CATARACT EXTRACTION PHACO AND INTRAOCULAR LENS PLACEMENT LEFT EYE;  Surgeon: Tonny Branch, MD;  Location: [facility];  Service: Ophthalmology;  Laterality: Left;  CDE:18.87  . Cholecystectomy    . Tee without cardioversion N/A 04/01/2015    Procedure: TRANSESOPHAGEAL ECHOCARDIOGRAM (TEE);  Surgeon: Sanda Klein, MD;  Location: [facility] ENDOSCOPY;  Service: Cardiovascular;  Laterality: N/A;    There were no vitals filed for this visit.  Visit Diagnosis: Dysphagia      Subjective Assessment - 10/11/15 1619    Subjective Alert,  pleasant upright in haustead chair, appears very weak   Currently in Pain? No/denies             General - 10/11/15 1621    General Information   Date of Onset 09/19/15   Other Pertinent Information Laura Melendez is a 74 y.o. female with a medical hx of HTN, Type 2 DM, stroke, who had an elective surgery for left superficial parotidectomy with facial nerve dissection suspicious for malignancy at [facility]. went to the operating room where she underwent a left superficial parotidectomy with facial nerve dissection. On postoperative day one she developed a left neck hematoma and had to go back for surgical incision and evacuation on 10/4. The wound from the neck hematoma was left open and treated with wet to dry saline moist Kerlix dressings and the wounds incision to heal by secondary intention. On 10/4 she went into respiratory distress and required intubation on the floor. It was felt that she had reactive airway swelling in the setting of the hematoma and postoperative swelling. She further developed a right pneumothorax requiring a chest tube removed on 10/10. She was also excessively extubated on 10/10. She then developed a pneumonia with respiratory culture growing Escherichia coli and Klebsiella and she completed a 7 day course of antibiotics ending on 10/14. An x-ray on 10/16 showed pulmonary edema and she was diureses with Lasix. Pt with PEG placement 10-03-15. ST to assess current swallow function.  Type of Study Other (Comment)  MBSS   Reason for Referral Objectively evaluate swallowing function   Previous Swallow Assessment MBS April 2016: purre, thin recommendations    Diet Prior to this Study NPO   Temperature Spikes Noted N/A  unknown    Respiratory Status Room air   History of Recent Intubation Yes   Length of Intubations (days) 6 days   Date extubated 09/26/15   Behavior/Cognition Alert   Oral Cavity - Dentition Edentulous;Missing dentition;Other (Comment)  primarily  edentulous   Oral Motor / Sensory Function Impaired   Self-Feeding Abilities Needs assist   Patient Positioning Upright in chair/Tumbleform   Baseline Vocal Quality Low vocal intensity   Volitional Cough Weak   Anatomy --  surgical removal of parotid gland    Pharyngeal Secretions Not observed secondary MBS            Oral Preparation/Oral Phase - 10/11/15 1626    Oral Preparation/Oral Phase   Oral Phase Impaired   Oral - Honey   Oral - Honey Teaspoon Lingual pumping;Weak ligual manipulation;Imparied mastication;Decreased bolus cohesion;Delayed A-P transit;Oral residue   Oral - Honey Cup Weak ligual manipulation;Lingual pumping;Imparied mastication;Decreased bolus cohesion;Delayed A-P transit;Oral residue   Oral - Nectar   Oral - Nectar Teaspoon Weak ligual manipulation;Lingual pumping;Imparied mastication;Oral residue;Delayed A-P transit;Decreased bolus cohesion   Oral - Thin   Oral - Thin Teaspoon Weak ligual manipulation;Lingual pumping;Imparied mastication;Decreased bolus cohesion;Delayed A-P transit;Oral residue   Oral - Thin Cup Weak ligual manipulation;Lingual pumping;Imparied mastication;Delayed A-P transit;Decreased bolus cohesion;Oral residue   Oral - Solids   Oral - Puree Weak ligual manipulation;Imparied mastication;Delayed A-P transit;Decreased bolus cohesion;Oral residue   Electrical stimulation - Oral Phase   Was Electrical Stimulation Used No          Pharyngeal Phase - 10/11/15 1630    Pharyngeal Phase   Pharyngeal Phase Impaired   Pharyngeal - Honey   Pharyngeal- Honey Teaspoon Swallow initiation at vallecula;Delayed swallow initiation   Pharyngeal- Honey Cup Delayed swallow initiation;Pharyngeal residue - valleculae;Swallow initiation at vallecula   Pharyngeal - Nectar   Pharyngeal- Nectar Teaspoon Delayed swallow initiation;Penetration/Aspiration during swallow;Penetration/Apiration after swallow  5: material enters airway contacts VC and is not ejected    Pharyngeal - Thin   Pharyngeal- Thin Teaspoon Delayed swallow initiation;Swallow initiation at pyriform sinus;Lateral channel residue;Pharyngeal residue - pyriform   Pharyngeal - Solids   Pharyngeal- Puree Delayed swallow initiation;Penetration/Aspiration during swallow;Penetration/Apiration after swallow;Swallow initiation at vallecula  7; material passes below TVC, and is not ejected despite effort    Electrical Stimulation - Pharyngeal Phase   Was Electrical Stimulation Used No                    Plan - 10/11/15 1655    Clinical Impression Statement Pt presents with moderate to severe sensory motor oropharygneal dysphagia of multi factorial etiology (post left superficial parotidectomy with facial nerve dissection, decreased respiratory support inclusive of 6 day intubation, extubation date noted 09-26-15, and generalized weakness including oral and pharyngeal musculature). Pt with weak lingual manipulation of all bolus consistencies leading to premature spillage and severe delay in swallow initiation. Aspiration evidenced with thin liquids via cup sip which was sensed but not effectively cleared by weak reflexive cough. Pt with oral residuals of thin liquids which later pentrated to the level of the true vocal cords, further compromising patients airway protection after the swallow. Pt with deep penetration of nectar thick liquids by teaspoon. No penetration or aspiration of honey thick  liquids or puree consistencies. However larger honey thick liquid sip size yielded vallecular residuals. Recommend puree diet and honey thick liquids via teaspoon with SLP only and medicines via alternative means. Okay for treating SLP to clinically upgrade conservatively as tolerated given anticipated recovery. Pt is very high risk for aspiration given recent severity of surgical procedures and medical comorbidities further compromising strength and endurance.           G-Codes - October 21, 2015 1651     Functional Assessment Tool Used MBSS; clinical judgement   Functional Limitations Swallowing   Swallow Current Status (T0354) At least 80 percent but less than 100 percent impaired, limited or restricted   Swallow Goal Status (S5681) At least 40 percent but less than 60 percent impaired, limited or restricted   Swallow Discharge Status [phone]) At least 80 percent but less than 100 percent impaired, limited or restricted          Recommendations/Treatment - 10-21-15 1646    Swallow Evaluation Recommendations   Recommended Consults Other (Comment)  SLP follow at SNF   SLP Diet Recommendations Dysphagia 1 (Puree);Honey  honey thick liquids via tsp with SLP only    Liquid Administration via Spoon   Medication Administration Via alternative means   Supervision Full supervision/cueing for compensatory strategies;Staff to assist with self feeding   Compensations Multiple dry swallows after each bite/sip;Minimize environmental distractions;Feed no longer than 30 minutes;Slow rate   Postural Changes Seated upright at 90 degrees;Remain semi-upright after after feeds/meals (Comment)          Prognosis - October 21, 2015 1649    Prognosis   Prognosis for Safe Diet Advancement Fair   Barriers to Reach Goals Severity of deficits   Individuals Consulted   Consulted and Agree with Results and Recommendations Patient   Report Sent to  Primary SLP      Problem List Patient Active Problem List   Diagnosis Date Noted  . 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 Right hemiparesis ([place]) 04/04/2015  . 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 Aortic thrombus ([place])   . Stroke with cerebral ischemia ([place])   . 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 DM (diabetes mellitus) ([place]) 03/23/2014  . Osteoporosis, unspecified 03/23/2014  . Dyslipidemia 03/23/2014  . Tobacco  abuse 03/23/2014  . Syncope 01/01/2014  . Essential hypertension, benign 01/01/2014   Arvil Chaco MA, [place] [facility] Speech Language Pathologist    Levi Aland 10-21-2015, 5:28 PM  [place] [address] [place], [place], [address] Phone: [phone]   Fax:  [phone]  Name: AIZA VOLLRATH MRN: [id number] Date of Birth: [birth year]

## 2015-10-12 ENCOUNTER — Ambulatory Visit (HOSPITAL_COMMUNITY): Payer: Self-pay | Admitting: Physical Therapy

## 2015-10-12 ENCOUNTER — Encounter (HOSPITAL_COMMUNITY)
Admission: AD | Admit: 2015-10-12 | Discharge: 2015-10-12 | Disposition: A | Discharge status: Home / Self Care | Payer: Medicare HMO | Source: Skilled Nursing Facility | Attending: Internal Medicine | Admitting: Internal Medicine

## 2015-10-12 ENCOUNTER — Non-Acute Institutional Stay (SKILLED_NURSING_FACILITY): Payer: Medicare HMO | Admitting: Internal Medicine

## 2015-10-12 DIAGNOSIS — R1314 Dysphagia, pharyngoesophageal phase: Secondary | ICD-10-CM | POA: Diagnosis not present

## 2015-10-12 DIAGNOSIS — R531 Weakness: Secondary | ICD-10-CM | POA: Diagnosis not present

## 2015-10-12 DIAGNOSIS — T8189XA Other complications of procedures, not elsewhere classified, initial encounter: Secondary | ICD-10-CM | POA: Diagnosis not present

## 2015-10-12 DIAGNOSIS — R112 Nausea with vomiting, unspecified: Secondary | ICD-10-CM

## 2015-10-12 DIAGNOSIS — F322 Major depressive disorder, single episode, severe without psychotic features: Secondary | ICD-10-CM | POA: Diagnosis not present

## 2015-10-12 LAB — CK: CK TOTAL: 24 U/L — AB (ref 38–234)

## 2015-10-12 LAB — BASIC METABOLIC PANEL
ANION GAP: 9 (ref 5–15)
BUN: 25 mg/dL — ABNORMAL HIGH (ref 6–20)
CALCIUM: 8.2 mg/dL — AB (ref 8.9–10.3)
CO2: 27 mmol/L (ref 22–32)
Chloride: 93 mmol/L — ABNORMAL LOW (ref 101–111)
Creatinine, Ser: 0.45 mg/dL (ref 0.44–1.00)
GFR calc Af Amer: >60 mL/min (ref >60)
Glucose, Bld: 239 mg/dL — ABNORMAL HIGH (ref 65–99)
Potassium: 4.2 mmol/L (ref 3.5–5.1)
SODIUM: 129 mmol/L — AB (ref 135–145)

## 2015-10-12 LAB — PROTIME-INR
INR: 1.68 — AB (ref 0.00–1.49)
PROTHROMBIN TIME: 19.8 s — AB (ref 11.6–15.2)

## 2015-10-12 NOTE — Progress Notes (Signed)
Patient ID: SECRET Laura Melendez, female   DOB: 04/29/1941, 74 y.o.   MRN: [id number] [facility] Chief complaint; review of multiple medical issues including vomiting, dysphagia, History; this patient is not had an easy last 48 hours. She went over for a modified barium swallow yesterday at the direction of our speech pathologist. There she apparently failed miserably with gross aspiration. His story bleed she had a left hemisphere stroke in April. I've look through this. She apparently had a swallowing study done before she left the hospital in April which had some problem however she was able to protect her upper airway. She is certainly apparently not able to do that currently. She has recommended to be nothing by mouth. She already has a feeding tube in place. This morning apparently she vomited copious amounts of tube feeding the tube feeding is now off pending my review. She did not have gastric retention of tube feed looking back through the amounts that are reported.  I thought she with is mildly dehydrated when I saw her on Monday I increased her tube feed flushes. That is not well reflected in her lab work although her sodium is down to 129. It is very difficult to get this lady to say anything although she can respond to commands. I suspect most of this is severe depression  I have reviewed the records from [facility]. There isn't really much to add other than what was in the discharge summary. She went for an elective leftleft parotidectomy. She subsequently had a postoperative hematoma that required surgical evacuation. She then went on to have respiratory distress secondary to probable aspiration pneumonia and a right pneumothorax requiring a chest tube. She was extubated on 10/10 she received antibiotics for a gram-negative pneumonia ecoli and klebsiella. The patient apparently asked that the swallowing study before she left [facility] but almost immediately when we tried to feed her here she has not  done well and she is gone on to fail a modified barium status study. The speech therapist in the building feels that she was sat gross risk if we continued to feed her. I don't see that she had any neuro imaging or other neurologic studies. Her CK is not elevated [ on a statin].   BMP Latest Ref Rng 10/12/2015 10/08/2015 10/08/2015  Glucose 65 - 99 mg/dL 239(H) 215(H) 178(H)  BUN 6 - 20 mg/dL 25(H) 28(H) 27(H)  Creatinine 0.44 - 1.00 mg/dL 0.45 0.56 0.59  BUN/Creat Ratio 11 - 26 - - -  Sodium 135 - 145 mmol/L 129(L) 135 138  Potassium 3.5 - 5.1 mmol/L 4.2 4.1 4.6  Chloride 101 - 111 mmol/L 93(L) 101 100(L)  CO2 22 - 32 mmol/L 27 25 27   Calcium 8.9 - 10.3 mg/dL 8.2(L) 8.9 9.0   CBC Latest Ref Rng 10/08/2015 10/08/2015 04/18/2015  WBC 4.0 - 10.5 K/uL 12.8(H) 10.7(H) 8.7  Hemoglobin 12.0 - 15.0 g/dL 11.8(L) 11.8(L) 11.7(L)  Hematocrit 36.0 - 46.0 % 36.1 36.6 35.4(L)  Platelets 150 - 400 K/uL 435(H) 408(H) 237       Past Medical History  Diagnosis Date  . Essential hypertension, benign   . Type 2 diabetes mellitus ([place])   . Osteoporosis 06/2008  . Stroke [facility]) Parotid gland tumor    Past Surgical History  Procedure Laterality Date  . Ankle surgery Left 2004    otif  . Abdominal hysterectomy    . Anterior and posterior repair N/A 03/11/2013    Procedure: ANTERIOR (CYSTOCELE) AND POSTERIOR REPAIR (  RECTOCELE);  Surgeon: Linda Hedges, DO;  Location: [place] [facility];  Service: Gynecology;  Laterality: N/A;  with sacrospinous ligament fixation bilateral  . Colonoscopy  2009    negative  . Cataract extraction w/phaco Right 12/27/2014    Procedure: CATARACT EXTRACTION PHACO AND INTRAOCULAR LENS PLACEMENT RIGHT EYE;  Surgeon: Tonny Branch, MD;  Location: [facility];  Service: Ophthalmology;  Laterality: Right;  CDE:10.63  . Cataract extraction w/phaco Left 01/13/2015      . Cholecystectomy    . Tee without cardioversion N/A 04/01/2015    Procedure: TRANSESOPHAGEAL ECHOCARDIOGRAM (TEE);  Surgeon: Sanda Klein, MD;  Location: [facility] ENDOSCOPY;  Service: Cardiovascular;  Laterality: N/A;  Left parotid parotidectomy                                     10/3 [facility]     Current medications Coumadin 1 mg she is a Thursday and Saturday 2 mg Sunday Monday Wednesday and Friday Evista 60 mg a day Fosamax 70 mg once a day on Sunday Flex 504 times a day 750 in the evening and morning Lisinopril 2.5 daily Lovenox 40 subcutaneous once a day Metformin 250 twice a day Metopic loll 25 twice a day Nifedipine 60 mg once a day Pravastatin 40 mg a day Ritalin 5 mg every 9 AM and 12 noon Senokot 8.6/52 tablets daily   Review of systems Gen. it is virtually impossible to get any meaningful information out of this woman. She keeps her eyes closed only responds with one word answers. I think she is probably cognitively intact but I just don't think i getmeaningfully get information here. The nurse reports that she had several episodes of vomiting. There may have been coffee-grounds  Physical examination Gen. patient looks exceptionally weak and frail. Vitals blood pressure 133/67 temperature 98.1 respirations 18 pulse 98 weight 90.6 pounds O2 sat is 95% on room air HEENT no scleral icterus. Oral exam is normal moist mucous membranes Lymph; none palpable in the cervical clavicular or axilla re-areas Neck surgical site looks very stable. There is no evidence of infection here. Respiratory air entry sounds equal bilaterally there is no wheezing no crackles work of breathing seems normal Cardiac heart sounds are normal she does not appear to be dehydrated Abdomen; PEG site looks stable no overt infection. There is no liver no spleen no tenderness no masses bowel sounds are normal. GU PEG site is intact no suprapubic or costovertebral angle tenderness. Neurologic; she has antigravity strength in her arms but not her legs bilaterally although I wonder about the effort here. Reflexes are symmetrically normal toes  are downgoing no Hoffman's reflex. Her cranial nerves seem to be mostly intact as much as I can tell this would include 346 710 and 11 Mental status I suspect severe depression here   Impression/plan #1 dysphagia. Apparently she failed are swallowing study remarkably. Our speech therapist expressed extreme concern about giving this woman anything orally therefore she is nothing by mouth. The reason behind this is not really clear she had a left hemisphere stroke in April at [facility] but managed to pass a swallowing study before she left the. There were obvious problems with this at [facility] and she had a PEG tube placed. I looked on care anywhere there are no further CNS imaging studies. Her exam is non-lateralizing at the bedside although I cannot be certain that this woman hasn't had a brainstem stroke there  does not appear to be gross lateralizing neurologic findings however. #2 vomiting; the cause of this is not clear her abdominal examination is benign there may have been some coffee-ground emesis associated with this it is difficult to tell. She did not have gastric residual tube feeding. I'm going to go ahead and start the tube feedings. At this point I see no reason to continue her osteoporosis medications and I'm going to discontinue the Fosamax and Evista. #3 on Coumadin; I think this was started sometime in April at which time she had ascending therapy thoracic aneurysm thrombus. Her INR today was 1.68 there does not seem to be any reason to change the Coumadin #4 aspiration pneumonia with respiratory failure postoperative at [facility] with gram-negative rods cultured. Her current respiratory status seems to be stable.  #5 Hyponatremia; I think this is probably volume expanded with free fluid and tube feeding I'm going to put her on some sodium chloride tablets. I think this is symptomatic. #6 I suspect major depression here. She is on Ritalin which I will continue.I will start her on an  SSRI.  Stents of the orders left on this patient. Extensive review of care everywhere at [facility]. This patient continues to do poorly

## 2015-10-13 ENCOUNTER — Ambulatory Visit (HOSPITAL_COMMUNITY)
Admit: 2015-10-13 | Discharge: 2015-10-13 | Disposition: A | Discharge status: Home / Self Care | Payer: Medicare HMO | Source: Skilled Nursing Facility | Attending: Internal Medicine | Admitting: Internal Medicine

## 2015-10-13 ENCOUNTER — Telehealth: Payer: Self-pay | Admitting: Vascular Surgery

## 2015-10-13 ENCOUNTER — Ambulatory Visit (HOSPITAL_COMMUNITY)
Admit: 2015-10-13 | Discharge: 2015-10-13 | Disposition: A | Discharge status: Home / Self Care | Payer: Medicare HMO | Attending: Internal Medicine | Admitting: Internal Medicine

## 2015-10-13 DIAGNOSIS — I639 Cerebral infarction, unspecified: Secondary | ICD-10-CM | POA: Insufficient documentation

## 2015-10-13 DIAGNOSIS — G319 Degenerative disease of nervous system, unspecified: Secondary | ICD-10-CM | POA: Diagnosis not present

## 2015-10-13 DIAGNOSIS — E119 Type 2 diabetes mellitus without complications: Secondary | ICD-10-CM | POA: Diagnosis not present

## 2015-10-13 DIAGNOSIS — R05 Cough: Secondary | ICD-10-CM | POA: Diagnosis not present

## 2015-10-13 DIAGNOSIS — I10 Essential (primary) hypertension: Secondary | ICD-10-CM | POA: Diagnosis not present

## 2015-10-13 NOTE — Telephone Encounter (Signed)
I discussed case with husband. Chest x-ray looked good. MRI pending. Patient unfortunately having significant amount of setbacks ever since surgery. Long-term prognosis guarded. Parotid tumor aggressive. Once patient is doing better oncology consultation more than likely radiation therapy if she improves to the point where this could be attempted

## 2015-10-13 NOTE — Telephone Encounter (Signed)
-----   Message from Denman George, RN sent at 10/11/2015  9:44 AM EDT ----- Regarding: request to move appt. to later date Contact: [phone] The husband called to request a later appt. For her CTA and office appt. with [facility].  The pt. just went through jaw surgery @ [facility] and has had complications.  He is requesting to move the appts. to later in December.

## 2015-10-13 NOTE — Telephone Encounter (Signed)
Spoke with spouse to adjust appts, dpm

## 2015-10-14 ENCOUNTER — Encounter (HOSPITAL_COMMUNITY)
Admission: AD | Admit: 2015-10-14 | Discharge: 2015-10-14 | Disposition: A | Discharge status: Home / Self Care | Payer: Medicare HMO | Source: Skilled Nursing Facility | Attending: Internal Medicine | Admitting: Internal Medicine

## 2015-10-14 LAB — COMPREHENSIVE METABOLIC PANEL
ALBUMIN: 2.2 g/dL — AB (ref 3.5–5.0)
ALT: 23 U/L (ref 14–54)
AST: 22 U/L (ref 15–41)
Alkaline Phosphatase: 55 U/L (ref 38–126)
Anion gap: 9 (ref 5–15)
BUN: 34 mg/dL — AB (ref 6–20)
CHLORIDE: 93 mmol/L — AB (ref 101–111)
CO2: 26 mmol/L (ref 22–32)
CREATININE: 0.56 mg/dL (ref 0.44–1.00)
Calcium: 8.1 mg/dL — ABNORMAL LOW (ref 8.9–10.3)
GFR calc Af Amer: >60 mL/min (ref >60)
GLUCOSE: 178 mg/dL — AB (ref 65–99)
Potassium: 4.6 mmol/L (ref 3.5–5.1)
Sodium: 128 mmol/L — ABNORMAL LOW (ref 135–145)
Total Bilirubin: 0.5 mg/dL (ref 0.3–1.2)
Total Protein: 5.5 g/dL — ABNORMAL LOW (ref 6.5–8.1)

## 2015-10-14 LAB — CBC WITH DIFFERENTIAL/PLATELET
Basophils Absolute: 0 10*3/uL (ref 0.0–0.1)
Basophils Relative: 0 %
EOS ABS: 0.1 10*3/uL (ref 0.0–0.7)
EOS PCT: 1 %
HCT: 34.4 % — ABNORMAL LOW (ref 36.0–46.0)
Hemoglobin: 11.3 g/dL — ABNORMAL LOW (ref 12.0–15.0)
LYMPHS ABS: 1.4 10*3/uL (ref 0.7–4.0)
Lymphocytes Relative: 12 %
MCH: 26.3 pg (ref 26.0–34.0)
MCHC: 32.8 g/dL (ref 30.0–36.0)
MCV: 80.2 fL (ref 78.0–100.0)
MONO ABS: 0.9 10*3/uL (ref 0.1–1.0)
MONOS PCT: 8 %
Neutro Abs: 9.2 10*3/uL — ABNORMAL HIGH (ref 1.7–7.7)
Neutrophils Relative %: 79 %
PLATELETS: 406 10*3/uL — AB (ref 150–400)
RBC: 4.29 MIL/uL (ref 3.87–5.11)
RDW: 14.4 % (ref 11.5–15.5)
WBC: 11.6 10*3/uL — AB (ref 4.0–10.5)

## 2015-10-14 LAB — PROTIME-INR
INR: 2.16 — ABNORMAL HIGH (ref 0.00–1.49)
PROTHROMBIN TIME: 23.9 s — AB (ref 11.6–15.2)

## 2015-10-17 ENCOUNTER — Inpatient Hospital Stay (HOSPITAL_COMMUNITY)
Admission: EM | Admit: 2015-10-17 | Discharge: 2015-10-21 | DRG: 871 | Disposition: A | Discharge status: Skilled Nursing Facility | Payer: Medicare HMO | Attending: Internal Medicine | Admitting: Internal Medicine

## 2015-10-17 ENCOUNTER — Encounter (HOSPITAL_COMMUNITY): Payer: Self-pay

## 2015-10-17 ENCOUNTER — Encounter (HOSPITAL_COMMUNITY)
Admission: RE | Admit: 2015-10-17 | Discharge: 2015-10-17 | Disposition: A | Discharge status: Home / Self Care | Payer: Medicare HMO | Source: Skilled Nursing Facility | Attending: Internal Medicine | Admitting: Internal Medicine

## 2015-10-17 ENCOUNTER — Other Ambulatory Visit: Payer: Self-pay

## 2015-10-17 ENCOUNTER — Emergency Department (HOSPITAL_COMMUNITY): Payer: Medicare HMO

## 2015-10-17 ENCOUNTER — Non-Acute Institutional Stay (SKILLED_NURSING_FACILITY): Payer: Medicare HMO | Admitting: Internal Medicine

## 2015-10-17 DIAGNOSIS — C07 Malignant neoplasm of parotid gland: Secondary | ICD-10-CM | POA: Diagnosis present

## 2015-10-17 DIAGNOSIS — A419 Sepsis, unspecified organism: Secondary | ICD-10-CM | POA: Diagnosis present

## 2015-10-17 DIAGNOSIS — E875 Hyperkalemia: Secondary | ICD-10-CM | POA: Diagnosis present

## 2015-10-17 DIAGNOSIS — R1110 Vomiting, unspecified: Secondary | ICD-10-CM

## 2015-10-17 DIAGNOSIS — J189 Pneumonia, unspecified organism: Secondary | ICD-10-CM | POA: Diagnosis present

## 2015-10-17 DIAGNOSIS — E876 Hypokalemia: Secondary | ICD-10-CM | POA: Diagnosis not present

## 2015-10-17 DIAGNOSIS — Z87891 Personal history of nicotine dependence: Secondary | ICD-10-CM

## 2015-10-17 DIAGNOSIS — Z7901 Long term (current) use of anticoagulants: Secondary | ICD-10-CM

## 2015-10-17 DIAGNOSIS — N390 Urinary tract infection, site not specified: Secondary | ICD-10-CM | POA: Diagnosis present

## 2015-10-17 DIAGNOSIS — Z809 Family history of malignant neoplasm, unspecified: Secondary | ICD-10-CM

## 2015-10-17 DIAGNOSIS — D638 Anemia in other chronic diseases classified elsewhere: Secondary | ICD-10-CM | POA: Diagnosis present

## 2015-10-17 DIAGNOSIS — E440 Moderate protein-calorie malnutrition: Secondary | ICD-10-CM | POA: Diagnosis present

## 2015-10-17 DIAGNOSIS — I69351 Hemiplegia and hemiparesis following cerebral infarction affecting right dominant side: Secondary | ICD-10-CM

## 2015-10-17 DIAGNOSIS — G8191 Hemiplegia, unspecified affecting right dominant side: Secondary | ICD-10-CM | POA: Diagnosis not present

## 2015-10-17 DIAGNOSIS — E872 Acidosis: Secondary | ICD-10-CM | POA: Diagnosis present

## 2015-10-17 DIAGNOSIS — I10 Essential (primary) hypertension: Secondary | ICD-10-CM | POA: Diagnosis present

## 2015-10-17 DIAGNOSIS — R112 Nausea with vomiting, unspecified: Secondary | ICD-10-CM

## 2015-10-17 DIAGNOSIS — R0902 Hypoxemia: Secondary | ICD-10-CM

## 2015-10-17 DIAGNOSIS — Z833 Family history of diabetes mellitus: Secondary | ICD-10-CM

## 2015-10-17 DIAGNOSIS — E871 Hypo-osmolality and hyponatremia: Secondary | ICD-10-CM | POA: Diagnosis present

## 2015-10-17 DIAGNOSIS — J9601 Acute respiratory failure with hypoxia: Secondary | ICD-10-CM

## 2015-10-17 DIAGNOSIS — Z7984 Long term (current) use of oral hypoglycemic drugs: Secondary | ICD-10-CM

## 2015-10-17 DIAGNOSIS — J690 Pneumonitis due to inhalation of food and vomit: Secondary | ICD-10-CM | POA: Diagnosis present

## 2015-10-17 DIAGNOSIS — G40909 Epilepsy, unspecified, not intractable, without status epilepticus: Secondary | ICD-10-CM | POA: Diagnosis present

## 2015-10-17 DIAGNOSIS — E119 Type 2 diabetes mellitus without complications: Secondary | ICD-10-CM

## 2015-10-17 DIAGNOSIS — Z681 Body mass index (BMI) 19 or less, adult: Secondary | ICD-10-CM

## 2015-10-17 DIAGNOSIS — E1165 Type 2 diabetes mellitus with hyperglycemia: Secondary | ICD-10-CM | POA: Diagnosis present

## 2015-10-17 DIAGNOSIS — E118 Type 2 diabetes mellitus with unspecified complications: Secondary | ICD-10-CM | POA: Diagnosis not present

## 2015-10-17 HISTORY — DX: Personal history of malignant neoplasm of other sites of lip, oral cavity, and pharynx: Z85.818

## 2015-10-17 LAB — GLUCOSE, CAPILLARY
Glucose-Capillary: 123 mg/dL — ABNORMAL HIGH (ref 65–99)
Glucose-Capillary: 107 mg/dL — ABNORMAL HIGH (ref 65–99)

## 2015-10-17 LAB — CBC WITH DIFFERENTIAL/PLATELET
BASOS PCT: 0 %
Basophils Absolute: 0 10*3/uL (ref 0.0–0.1)
EOS ABS: 0 10*3/uL (ref 0.0–0.7)
EOS PCT: 0 %
HCT: 32.1 % — ABNORMAL LOW (ref 36.0–46.0)
Hemoglobin: 10.5 g/dL — ABNORMAL LOW (ref 12.0–15.0)
LYMPHS ABS: 0.8 10*3/uL (ref 0.7–4.0)
Lymphocytes Relative: 4 %
MCH: 26.3 pg (ref 26.0–34.0)
MCHC: 32.7 g/dL (ref 30.0–36.0)
MCV: 80.5 fL (ref 78.0–100.0)
MONO ABS: 0.5 10*3/uL (ref 0.1–1.0)
MONOS PCT: 2 %
Neutro Abs: 20.8 10*3/uL — ABNORMAL HIGH (ref 1.7–7.7)
Neutrophils Relative %: 94 %
Platelets: 516 10*3/uL — ABNORMAL HIGH (ref 150–400)
RBC: 3.99 MIL/uL (ref 3.87–5.11)
RDW: 14.3 % (ref 11.5–15.5)
WBC: 22 10*3/uL — ABNORMAL HIGH (ref 4.0–10.5)

## 2015-10-17 LAB — URINALYSIS, ROUTINE W REFLEX MICROSCOPIC
BILIRUBIN URINE: NEGATIVE
GLUCOSE, UA: NEGATIVE mg/dL
Nitrite: NEGATIVE
Specific Gravity, Urine: 1.02 (ref 1.005–1.030)
UROBILINOGEN UA: 0.2 mg/dL (ref 0.0–1.0)
pH: 5.5 (ref 5.0–8.0)

## 2015-10-17 LAB — COMPREHENSIVE METABOLIC PANEL
ALBUMIN: 2 g/dL — AB (ref 3.5–5.0)
ALT: 28 U/L (ref 14–54)
ANION GAP: 9 (ref 5–15)
AST: 31 U/L (ref 15–41)
Alkaline Phosphatase: 51 U/L (ref 38–126)
BILIRUBIN TOTAL: 0.4 mg/dL (ref 0.3–1.2)
BUN: 27 mg/dL — ABNORMAL HIGH (ref 6–20)
CALCIUM: 7.7 mg/dL — AB (ref 8.9–10.3)
CO2: 26 mmol/L (ref 22–32)
Chloride: 93 mmol/L — ABNORMAL LOW (ref 101–111)
Creatinine, Ser: 0.63 mg/dL (ref 0.44–1.00)
GLUCOSE: 255 mg/dL — AB (ref 65–99)
POTASSIUM: 5.2 mmol/L — AB (ref 3.5–5.1)
Sodium: 128 mmol/L — ABNORMAL LOW (ref 135–145)
TOTAL PROTEIN: 5.1 g/dL — AB (ref 6.5–8.1)

## 2015-10-17 LAB — BRAIN NATRIURETIC PEPTIDE: B NATRIURETIC PEPTIDE 5: 118 pg/mL — AB (ref 0.0–100.0)

## 2015-10-17 LAB — MRSA PCR SCREENING: MRSA by PCR: NEGATIVE

## 2015-10-17 LAB — PROCALCITONIN: Procalcitonin: <0.1 ng/mL

## 2015-10-17 LAB — LIPASE, BLOOD: LIPASE: 33 U/L (ref 11–51)

## 2015-10-17 LAB — LACTIC ACID, PLASMA
LACTIC ACID, VENOUS: 3.4 mmol/L — AB (ref 0.5–2.0)
LACTIC ACID, VENOUS: 2.2 mmol/L — AB (ref 0.5–2.0)
Lactic Acid, Venous: 4.2 mmol/L (ref 0.5–2.0)
Lactic Acid, Venous: 1.1 mmol/L (ref 0.5–2.0)

## 2015-10-17 LAB — CBG MONITORING, ED: Glucose-Capillary: 152 mg/dL — ABNORMAL HIGH (ref 65–99)

## 2015-10-17 LAB — PROTIME-INR
INR: 4.08 — ABNORMAL HIGH (ref 0.00–1.49)
INR: 5.14 — AB (ref 0.00–1.49)
PROTHROMBIN TIME: 38.5 s — AB (ref 11.6–15.2)
PROTHROMBIN TIME: 45.9 s — AB (ref 11.6–15.2)

## 2015-10-17 LAB — URINE MICROSCOPIC-ADD ON

## 2015-10-17 LAB — TROPONIN I

## 2015-10-17 LAB — APTT: APTT: 56 s — AB (ref 24–37)

## 2015-10-17 MED ORDER — SODIUM CHLORIDE 0.9 % IV BOLUS (SEPSIS): 1000.0000 mL | Freq: Once | INTRAVENOUS | Status: DC

## 2015-10-17 MED ORDER — PIPERACILLIN-TAZOBACTAM 3.375 G IVPB
3.3750 g | Freq: Three times a day (TID) | INTRAVENOUS | Status: DC
  Administered 2015-10-17 – 2015-10-20 (×9): 3.375 g via INTRAVENOUS
  Filled 2015-10-17 (×12): qty 50

## 2015-10-17 MED ORDER — PIPERACILLIN-TAZOBACTAM 3.375 G IVPB 30 MIN: 3.3750 g | Freq: Three times a day (TID) | INTRAVENOUS | Status: DC

## 2015-10-17 MED ORDER — SODIUM CHLORIDE 0.9 % IV SOLN
INTRAVENOUS | Status: DC
  Administered 2015-10-17: 10:00:00 via INTRAVENOUS

## 2015-10-17 MED ORDER — SODIUM CHLORIDE 0.9 % IJ SOLN
3.0000 mL | Freq: Two times a day (BID) | INTRAMUSCULAR | Status: DC
  Administered 2015-10-17 – 2015-10-21 (×7): 3 mL via INTRAVENOUS

## 2015-10-17 MED ORDER — SODIUM CHLORIDE 0.9 % IV BOLUS (SEPSIS)
1000.0000 mL | Freq: Once | INTRAVENOUS | Status: AC
  Administered 2015-10-17: 1000 mL via INTRAVENOUS

## 2015-10-17 MED ORDER — SODIUM CHLORIDE 0.9 % IV BOLUS (SEPSIS)
500.0000 mL | INTRAVENOUS | Status: AC
  Administered 2015-10-17: 500 mL via INTRAVENOUS

## 2015-10-17 MED ORDER — PIPERACILLIN-TAZOBACTAM 3.375 G IVPB 30 MIN
3.3750 g | Freq: Once | INTRAVENOUS | Status: AC
  Administered 2015-10-17: 3.375 g via INTRAVENOUS
  Filled 2015-10-17: qty 50

## 2015-10-17 MED ORDER — ACETAMINOPHEN 325 MG PO TABS: 650.0000 mg | ORAL_TABLET | Freq: Four times a day (QID) | ORAL | Status: DC | PRN

## 2015-10-17 MED ORDER — VANCOMYCIN HCL IN DEXTROSE 1-5 GM/200ML-% IV SOLN: 1000.0000 mg | Freq: Once | INTRAVENOUS | Status: DC

## 2015-10-17 MED ORDER — LEVETIRACETAM IN NACL 1500 MG/100ML IV SOLN
1500.0000 mg | Freq: Two times a day (BID) | INTRAVENOUS | Status: DC
  Administered 2015-10-17 – 2015-10-21 (×9): 1500 mg via INTRAVENOUS
  Filled 2015-10-17 (×15): qty 100

## 2015-10-17 MED ORDER — INSULIN ASPART 100 UNIT/ML ~~LOC~~ SOLN
0.0000 [IU] | SUBCUTANEOUS | Status: DC
  Administered 2015-10-19: 3 [IU] via SUBCUTANEOUS
  Administered 2015-10-19 (×2): 2 [IU] via SUBCUTANEOUS
  Administered 2015-10-19 – 2015-10-20 (×3): 1 [IU] via SUBCUTANEOUS
  Administered 2015-10-20: 2 [IU] via SUBCUTANEOUS
  Administered 2015-10-20: 1 [IU] via SUBCUTANEOUS
  Administered 2015-10-20 – 2015-10-21 (×3): 2 [IU] via SUBCUTANEOUS
  Administered 2015-10-21: 1 [IU] via SUBCUTANEOUS

## 2015-10-17 MED ORDER — ALBUTEROL SULFATE (2.5 MG/3ML) 0.083% IN NEBU: 2.5000 mg | INHALATION_SOLUTION | RESPIRATORY_TRACT | Status: DC

## 2015-10-17 MED ORDER — SODIUM CHLORIDE 0.9 % IV SOLN: INTRAVENOUS | Status: DC

## 2015-10-17 MED ORDER — SODIUM CHLORIDE 0.9 % IV BOLUS (SEPSIS): 500.0000 mL | Freq: Once | INTRAVENOUS | Status: DC

## 2015-10-17 MED ORDER — WARFARIN - PHARMACIST DOSING INPATIENT
Freq: Every day | Status: DC
  Administered 2015-10-17 – 2015-10-19 (×2)

## 2015-10-17 MED ORDER — ACETAMINOPHEN 650 MG RE SUPP
650.0000 mg | Freq: Once | RECTAL | Status: AC
  Administered 2015-10-17: 650 mg via RECTAL
  Filled 2015-10-17: qty 1

## 2015-10-17 MED ORDER — CETYLPYRIDINIUM CHLORIDE 0.05 % MT LIQD
7.0000 mL | Freq: Two times a day (BID) | OROMUCOSAL | Status: DC
  Administered 2015-10-17 – 2015-10-21 (×8): 7 mL via OROMUCOSAL

## 2015-10-17 MED ORDER — SODIUM CHLORIDE 0.9 % IV BOLUS (SEPSIS)
250.0000 mL | Freq: Once | INTRAVENOUS | Status: DC
  Administered 2015-10-17: 250 mL via INTRAVENOUS

## 2015-10-17 MED ORDER — SODIUM CHLORIDE 0.9 % IV SOLN
INTRAVENOUS | Status: DC
  Administered 2015-10-17: 14:00:00 via INTRAVENOUS

## 2015-10-17 MED ORDER — VANCOMYCIN HCL IN DEXTROSE 750-5 MG/150ML-% IV SOLN
750.0000 mg | INTRAVENOUS | Status: DC
  Administered 2015-10-18 – 2015-10-19 (×2): 750 mg via INTRAVENOUS
  Filled 2015-10-17 (×4): qty 150

## 2015-10-17 MED ORDER — ACETAMINOPHEN 650 MG RE SUPP: 650.0000 mg | Freq: Four times a day (QID) | RECTAL | Status: DC | PRN

## 2015-10-17 MED ORDER — LEVETIRACETAM 500 MG/5ML IV SOLN
1500.0000 mg | Freq: Two times a day (BID) | INTRAVENOUS | Status: DC
  Filled 2015-10-17 (×2): qty 15

## 2015-10-17 MED ORDER — INSULIN ASPART 100 UNIT/ML ~~LOC~~ SOLN: 0.0000 [IU] | Freq: Four times a day (QID) | SUBCUTANEOUS | Status: DC

## 2015-10-17 MED ORDER — VANCOMYCIN HCL IN DEXTROSE 750-5 MG/150ML-% IV SOLN
750.0000 mg | Freq: Once | INTRAVENOUS | Status: AC
  Administered 2015-10-17: 750 mg via INTRAVENOUS
  Filled 2015-10-17: qty 150

## 2015-10-17 MED ORDER — CHLORHEXIDINE GLUCONATE 0.12 % MT SOLN
15.0000 mL | Freq: Two times a day (BID) | OROMUCOSAL | Status: DC
  Administered 2015-10-17 – 2015-10-21 (×9): 15 mL via OROMUCOSAL
  Filled 2015-10-17 (×6): qty 15

## 2015-10-17 NOTE — Progress Notes (Signed)
ANTICOAGULATION CONSULT NOTE - Initial Consult  Pharmacy Consult for Coumadin Indication: aortic clot  No Known Allergies  Patient Measurements: Height: 5\' 2"  (157.5 cm) Weight: 93 lb 4.1 oz (42.3 kg) IBW/kg (Calculated) : 50.1  Vital Signs: Temp: 100.5 F (38.1 C) (10/31 1353) Temp Source: Axillary (10/31 1353) BP: 99/45 mmHg (10/31 1353) Pulse Rate: 106 (10/31 1353)  Labs:  Recent Labs  10/17/15 0630 10/17/15 0945 10/17/15 1418  HGB  --  10.5*  --   HCT  --  32.1*  --   PLT  --  516*  --   APTT  --   --  56*  LABPROT 38.5*  --  45.9*  INR 4.08*  --  5.14*  CREATININE  --  0.63  --   TROPONINI  --  <0.03  --     Estimated Creatinine Clearance: 41.2 mL/min (by C-G formula based on Cr of 0.63).   Medical History: Past Medical History  Diagnosis Date  . Essential hypertension, benign   . Type 2 diabetes mellitus ([place])   . Osteoporosis 06/2008  . 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 History of parotid cancer     Medications:  Prescriptions prior to admission  Medication Sig Dispense Refill Last Dose  . cephALEXin (KEFLEX) 500 MG capsule Take 1 capsule (500 mg total) by mouth 4 (four) times daily. 28 capsule 0 10/17/2015 at Unknown time  . collagenase (SANTYL) ointment Apply 1 application topically daily.   10/16/2015 at Unknown time  . enoxaparin (LOVENOX) 40 MG/0.4ML injection Inject 0.4 mLs (40 mg total) into the skin daily. 14 Syringe 0 10/16/2015 at Unknown time  . FLUoxetine (PROZAC) 10 MG capsule TAKE 1 CAPSULE BY MOUTH DAILY FOR DEPRESSION/ANXIETY 90 capsule 1 10/16/2015 at Unknown time  . ketoconazole (NIZORAL) 2 % cream Apply 1 application topically 2 (two) times daily.   10/16/2015 at Unknown time  . levETIRAcetam (KEPPRA) 750 MG tablet TAKE 1 TABLET BY MOUTH EVERY MORNING AND 2 TABLETS BY MOUTH EVERY NIGHT AT BEDTIME 270 tablet 3 10/16/2015 at Unknown time  . lisinopril (PRINIVIL,ZESTRIL) 2.5 MG tablet Take 1 tablet (2.5 mg total) by mouth  daily. 30 tablet 5 10/16/2015 at Unknown time  . metFORMIN (GLUCOPHAGE) 500 MG tablet Take one half tablet BID (Patient taking differently: Take 250 mg by mouth 2 (two) times daily with a meal. ) 30 tablet 5 10/16/2015 at Unknown time  . methylphenidate (RITALIN) 5 MG tablet Take one tablet per tube twice daily with breakfast and lunch 60 tablet 0 10/16/2015 at Unknown time  . metoprolol tartrate (LOPRESSOR) 25 MG tablet Take 25 mg by mouth 2 (two) times daily.   10/16/2015 at 0900  . NIFEdipine (PROCARDIA XL/ADALAT-CC) 60 MG 24 hr tablet Take 1 tablet (60 mg total) by mouth daily. DO NOT CRUSH 30 tablet 5 10/16/2015 at Unknown time  . ondansetron (ZOFRAN ODT) 4 MG disintegrating tablet Take 1 tablet (4 mg total) by mouth every 8 (eight) hours as needed for nausea or vomiting. 20 tablet 0 10/16/2015 at Unknown time  . pravastatin (PRAVACHOL) 40 MG tablet Take 1 tablet (40 mg total) by mouth daily. 30 tablet 12 10/16/2015 at Unknown time  . senna-docusate (SENOKOT-S) 8.6-50 MG per tablet Take 2 tablets by mouth 2 (two) times daily. For constipation 100 tablet 1 10/16/2015 at Unknown time  . sodium chloride 1 G tablet Take 1 g by mouth 2 (two) times daily with a meal.   10/16/2015 at Unknown  time  . warfarin (COUMADIN) 2 MG tablet Take 1/2 a pill on Friday evening and take a whole pill on all other days. (Patient taking differently: Take 1-2 mg by mouth See admin instructions. Take 1 mg on Tues, Thurs, and Sat.  Take 2 mg on Sun, Mon, Wed, and Fri.) 30 tablet 5 10/16/2015 at Unknown time  . metoprolol (LOPRESSOR) 50 MG tablet Take 1 tablet (50 mg total) by mouth 2 (two) times daily. (Patient not taking: Reported on 10/08/2015) 60 tablet 5 Not Taking at Unknown time  . raloxifene (EVISTA) 60 MG tablet Take 1 tablet (60 mg total) by mouth daily. 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  Goal of Therapy:  INR 2-3 Monitor platelets by anticoagulation protocol: Yes   Plan: Hold Coumadin PT/INR daily Restart coumadin when Lizabeth Leyden, BS Vena Austria, BCPS Clinical Pharmacist Pager [phone]  10/17/2015,3:55 PM

## 2015-10-17 NOTE — Progress Notes (Signed)
ANTIBIOTIC CONSULT NOTE - INITIAL  Pharmacy Consult for Vancomycin and Zosyn Indication: pneumonia  No Known Allergies  Patient Measurements: Weight: 90 lb (40.824 kg)  Vital Signs: Temp: 100.9 F (38.3 C) (10/31 0905) Temp Source: Rectal (10/31 0905) BP: 115/49 mmHg (10/31 1009) Pulse Rate: 113 (10/31 0952) Intake/Output from previous day:   Intake/Output from this shift:    Labs: No results for input(s): WBC, HGB, PLT, LABCREA, CREATININE in the last 72 hours. Estimated Creatinine Clearance: 39.7 mL/min (by C-G formula based on Cr of 0.56). No results for input(s): VANCOTROUGH, VANCOPEAK, VANCORANDOM, GENTTROUGH, GENTPEAK, GENTRANDOM, TOBRATROUGH, TOBRAPEAK, TOBRARND, AMIKACINPEAK, AMIKACINTROU, AMIKACIN in the last 72 hours.   Microbiology: Recent Results (from the past 720 hour(s))  Urine culture     Status: None (Preliminary result)   Collection Time: 10/08/15 10:41 AM  Result Value Ref Range Status   Specimen Description URINE, CATHETERIZED  Final   Special Requests NONE  Final   Culture   Final    >=100,000 COLONIES/mL YEAST Performed at [facility]    Report Status PENDING  Incomplete  C difficile quick scan w PCR reflex     Status: None   Collection Time: 10/08/15 12:06 PM  Result Value Ref Range Status   C Diff antigen NEGATIVE NEGATIVE Final   C Diff toxin NEGATIVE NEGATIVE Final   C Diff interpretation Negative for toxigenic C. difficile  Final  Wound culture     Status: None   Collection Time: 10/09/15  1:40 PM  Result Value Ref Range Status   Specimen Description NECK  Final   Special Requests Immunocompromised  Final   Gram Stain   Final    NO WBC SEEN NO SQUAMOUS EPITHELIAL CELLS SEEN NO ORGANISMS SEEN Performed at [facility]    Culture   Final    ABUNDANT DIPHTHEROIDS(CORYNEBACTERIUM SPECIES) Note: Standardized susceptibility testing for this organism is not available. Performed at [facility]    Report Status  10/11/2015 FINAL  Final    Medical History: Past Medical History  Diagnosis Date  . Essential hypertension, benign   . Type 2 diabetes mellitus ([place])   . Osteoporosis 06/2008  . Stroke [facility])     Medications:  See medication reconciliation  Assessment: 74 yo female from [facility] center presented with vomiting and hypoxemia Increased density at both lung bases worrisome for the clinically suspected aspiration and development of atelectasis or pneumonia. Stable enlargement cardiac silhouette without pulmonary edema. Pt [facility]  For left parotidectomy that was complicated by hematoma and an aspiration pneumonia. Empiric antibiotics with Zosyn and Vancomycin  Goal of Therapy:  Vancomycin trough level 15-20 mcg/ml  Plan:  Zosyn 3.375gm iv q8h extended dosing interval Vancomycin 750mg  iv q24h  Measure antibiotic drug levels at steady state Follow up culture results  Monitor V/S and labs  Isac Sarna, BS Pharm D, BCPS Clinical Pharmacist Pager [phone]  10/17/2015,10:20 AM

## 2015-10-17 NOTE — H&P (Signed)
History and Physical  LAQUIESHA PIACENTE OYD:[id number] DOB: 02/11/1941 DOA: 10/17/2015  Referring physician:  PCP: [name], MD   Chief Complaint:   HPI:  44 yof with PMH HTN, DM,  Stroke 03/2015 with residual right-sided weakness presents from [facility] with complaints of vomiting. ED issues: acute hypoxic resp failure, sepsis, aspiration pneumonia.    Patient unable to provide history secondary to acuity.  Per daughter and husband bedside since recent hospitalization and procedure she was able to recognize family and was able to understand them. There would be difficulty understanding her at times. Her right-sided weakness reported has been chronic since stroke 03/2015.  This AM see had profound vomiting at SNF and was sent to ED. She has a complex history, see care everywhere for details, in breath discharged from [facility] 10/21 where she underwent a left parotidectomy for parotid cancer. Hospitalization was complicated by left neck hematoma that required surgical incision on and evacuation on 10/4 on postoperative day one and has been left to heal by secondary intention. Subsequently she developed aspiration pneumonia, right pneumothorax requiring chest tube and intubation. Respiratory culture ultimately glue Escherichia coli and Klebsiella and she was treated for pneumonia. Subsequently the patient developed pulmonary edema and echocardiogram showed no signs of congestive heart failure. She ultimately required a PEG tube on 10/17 for dysphagia. Cognition was intermittently impaired during that hospitalization.  In the emergency department afebrile, tachycardic, hypotensive, hypoxic, tachypnic Pertinent labs: Sodium 128, BUN 27, BNP 118 Lactic acid 3.4, WBC 22  hGB 10.5 EKG: Independently reviewed. EKG ST, prolonged QT Imaging: independently reviewed. CXR suggest aspiration pneumonia.   Review of Systems:  Positive for SOB Negative for fever, visual changes, sore throat,  rash, new muscle aches, chest pain, SOB, dysuria, bleeding, n/v/abdominal pain but history is limited  Past Medical History  Diagnosis Date  . Essential hypertension, benign   . Type 2 diabetes mellitus ([place])   . Osteoporosis 06/2008  . 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 History of parotid cancer     Past Surgical History  Procedure Laterality Date  . Ankle surgery Left 2004    otif  . Abdominal hysterectomy    . Anterior and posterior repair N/A 03/11/2013    Procedure: ANTERIOR (CYSTOCELE) AND POSTERIOR REPAIR (RECTOCELE);  Surgeon: Linda Hedges, DO;  Location: [facility];  Service: Gynecology;  Laterality: N/A;  with sacrospinous ligament fixation bilateral  . Colonoscopy  2009    negative  . Cataract extraction w/phaco Right 12/27/2014    Procedure: CATARACT EXTRACTION PHACO AND INTRAOCULAR LENS PLACEMENT RIGHT EYE;  Surgeon: Tonny Branch, MD;  Location: [facility];  Service: Ophthalmology;  Laterality: Right;  CDE:10.63  . Cataract extraction w/phaco Left 01/13/2015    Procedure: CATARACT EXTRACTION PHACO AND INTRAOCULAR LENS PLACEMENT LEFT EYE;  Surgeon: Tonny Branch, MD;  Location: [facility];  Service: Ophthalmology;  Laterality: Left;  CDE:18.87  . Cholecystectomy    . Tee without cardioversion N/A 04/01/2015    Procedure: TRANSESOPHAGEAL ECHOCARDIOGRAM (TEE);  Surgeon: Sanda Klein, MD;  Location: [facility] ENDOSCOPY;  Service: Cardiovascular;  Laterality: N/A;    Social History:  reports that she quit smoking about 7 months ago. Her smoking use included Cigarettes. She has a 7.5 pack-year smoking history. She does not have any smokeless tobacco history on file. She reports that she does not drink alcohol or use illicit drugs. lives in a skilled nursing facility Partial assistance  No Known Allergies  Family History  Problem Relation  Age of Onset  . Cancer Father     Liver  . Cancer Sister     Breast  . Diabetes Brother   . Dementia Mother      Prior to Admission medications    Medication Sig Start Date End Date Taking? Authorizing Provider  cephALEXin (KEFLEX) 500 MG capsule Take 1 capsule (500 mg total) by mouth 4 (four) times daily. 10/08/15  Yes [name], MD  collagenase (SANTYL) ointment Apply 1 application topically daily.   Yes Historical Provider, MD  enoxaparin (LOVENOX) 40 MG/0.4ML injection Inject 0.4 mLs (40 mg total) into the skin daily. 09/08/15  Yes [name], MD  FLUoxetine (PROZAC) 10 MG capsule TAKE 1 CAPSULE BY MOUTH DAILY FOR DEPRESSION/ANXIETY 09/15/15  Yes [name], MD  ketoconazole (NIZORAL) 2 % cream Apply 1 application topically 2 (two) times daily.   Yes Historical Provider, MD  levETIRAcetam (KEPPRA) 750 MG tablet TAKE 1 TABLET BY MOUTH EVERY MORNING AND 2 TABLETS BY MOUTH EVERY NIGHT AT BEDTIME 08/01/15  Yes [name], MD  lisinopril (PRINIVIL,ZESTRIL) 2.5 MG tablet Take 1 tablet (2.5 mg total) by mouth daily. 07/12/15  Yes [name], MD  metFORMIN (GLUCOPHAGE) 500 MG tablet Take one half tablet BID Patient taking differently: Take 250 mg by mouth 2 (two) times daily with a meal.  04/25/15  Yes [name], MD  methylphenidate (RITALIN) 5 MG tablet Take one tablet per tube twice daily with breakfast and lunch 10/10/15  Yes Gildardo Cranker, DO  metoprolol tartrate (LOPRESSOR) 25 MG tablet Take 25 mg by mouth 2 (two) times daily.   Yes Historical Provider, MD  NIFEdipine (PROCARDIA XL/ADALAT-CC) 60 MG 24 hr tablet Take 1 tablet (60 mg total) by mouth daily. DO NOT CRUSH 07/12/15  Yes [name], MD  ondansetron (ZOFRAN ODT) 4 MG disintegrating tablet Take 1 tablet (4 mg total) by mouth every 8 (eight) hours as needed for nausea or vomiting. 10/08/15  Yes [name], MD  pravastatin (PRAVACHOL) 40 MG tablet Take 1 tablet (40 mg total) by mouth daily. 07/20/15  Yes [name], MD  senna-docusate (SENOKOT-S) 8.6-50 MG per tablet Take 2 tablets by mouth 2 (two) times daily. For constipation 04/22/15  Yes Ivan Anchors Love, PA-C    sodium chloride 1 G tablet Take 1 g by mouth 2 (two) times daily with a meal.   Yes Historical Provider, MD  warfarin (COUMADIN) 2 MG tablet Take 1/2 a pill on Friday evening and take a whole pill on all other days. Patient taking differently: Take 1-2 mg by mouth See admin instructions. Take 1 mg on Tues, Thurs, and Sat.  Take 2 mg on Sun, Mon, Wed, and Fri. 07/12/15  Yes [name], MD  metoprolol (LOPRESSOR) 50 MG tablet Take 1 tablet (50 mg total) by mouth 2 (two) times daily. Patient not taking: Reported on 10/08/2015 08/26/15   [name], MD  raloxifene (EVISTA) 60 MG tablet Take 1 tablet (60 mg total) by mouth daily. Patient not taking: Reported on 10/17/2015 07/12/15   [name], MD   Physical Exam: Filed Vitals:   10/17/15 1030 10/17/15 1100 10/17/15 1111 10/17/15 1116  BP: 106/48 102/41 99/44   Pulse:  101 99   Temp:    100.5 F (38.1 C)  TempSrc:    Rectal  Resp: 28 29 20    Weight:      SpO2:  100% 100%     General: Appears ill  but not toxic. Very thin, appears chronically ill  Eyes: Pupils appear normal. Lids, irises & conjunctiva normal. ENT: Limited but lips & tongue appear unremarkable.  Neck: no LAD, masses or thyromegaly Cardiovascular: Regular rhythm,  tachycardiac no m/r/g. No LE edema. Telemetry: SR, no arrhythmias  Respiratory:Posterior crackles bilaterally. Normal respiratory effort. Abdomen: soft, mild generalized tenderness. PEG tube in place without any evidence of complications Skin:  Bruising to the left knee. Wound in left inferior mandibular region which is packed.  Musculoskeletal: Moves extremities spontaneously. RUE contracture. Psychiatric: confused, does not participate in exam Neurologic: grossly non-focal except as above. H/o right-sided weakness  Wt Readings from Last 3 Encounters:  10/17/15 40.824 kg (90 lb)  10/13/15 40.824 kg (90 lb)  10/08/15 42.185 kg (93 lb)    Labs on Admission:  Basic Metabolic Panel:  Recent  Labs Lab 10/12/15 0550 10/14/15 0735 10/17/15 0945  NA 129* 128* 128*  K 4.2 4.6 5.2*  CL 93* 93* 93*  CO2 27 26 26   GLUCOSE 239* 178* 255*  BUN 25* 34* 27*  CREATININE 0.45 0.56 0.63  CALCIUM 8.2* 8.1* 7.7*    Liver Function Tests:  Recent Labs Lab 10/14/15 0735 10/17/15 0945  AST 22 31  ALT 23 28  ALKPHOS 55 51  BILITOT 0.5 0.4  PROT 5.5* 5.1*  ALBUMIN 2.2* 2.0*    Recent Labs Lab 10/17/15 0945  LIPASE 33   CBC:  Recent Labs Lab 10/14/15 0735 10/17/15 0945  WBC 11.6* 22.0*  NEUTROABS 9.2* 20.8*  HGB 11.3* 10.5*  HCT 34.4* 32.1*  MCV 80.2 80.5  PLT 406* 516*    Cardiac Enzymes:  Recent Labs Lab 10/12/15 0550 10/17/15 0945  CKTOTAL 24*  --   TROPONINI  --  <0.03     Radiological Exams on Admission: Dg Abd Acute W/chest  10/17/2015  CLINICAL DATA:  Nausea and vomiting, possible aspiration. EXAM: DG ABDOMEN ACUTE W/ 1V CHEST COMPARISON:  Chest x-ray of October 13, 2015 FINDINGS: Chest x-ray: The lungs are adequately inflated. There are increased densities in the lower lobes bilaterally. These are more conspicuous than in the past. The heart is top-normal in size but stable. The pulmonary vascularity is normal. The mediastinum is normal in width. Within the abdomen there is a moderate amount of contrast noted throughout the normal caliber colon. A gastrostomy tube is in place. The stomach and small bowel do not appear abnormally distended. There is gentle levocurvature of the mid lumbar spine. IMPRESSION: 1. Increased density at both lung bases worrisome for the clinically suspected aspiration and development of atelectasis or pneumonia. Stable enlargement cardiac silhouette without pulmonary edema. 2. The bowel gas pattern is within the limits of normal with exception of the presence of a moderate amount of contrast in the normal calibered colon. This contrast may be residual from previous studies. I do not have history of recent oral contrast  administration. Electronically Signed   By: David  Martinique M.D.   On: 10/17/2015 09:50      Principal Problem:   Aspiration pneumonia ([place]) Active Problems:   DM (diabetes mellitus) ([place])   Right hemiparesis ([place])   Sepsis ([place])   Acute respiratory failure with hypoxia ([facility])   Assessment/Plan 1. Sepsis secondary aspiration pneumonia, with lactic acidhypotension, tachycardia, tachypnea, hypoxia, low grade temp. Presumed from tube feeds. 2. Acute hypoxic respiratory failure 3. Hyponatremia, hyperkalemia, expect improvement with fluids. 4. Anemia of chronic disease, stable 5. Pyuria.  6. Parotid gland cancer s/p left parotidectomy at [facility]  with complicated postoperative course as outline by Dr. Dellia Nims 10/31 including aspiration pneumonia, pneumothorax, intubation, ultimately required PEG tube 7. DM type 2 stable. 8. Seizure disorder on Keppra 9. PMH stroke 03/2015, now on warfarin    Critically ill but now on [place]   Plan admission to SDU  Sepsis bundle, broad spectrum abx  Condition guarded   Code Status: Full  DVT prophylaxis:SCDs Family Communication: Daughter and husband bedside Disposition Plan/Anticipated LOS: Admit to SDU   Time spent: 42 minutes  Murray Hodgkins, MD  [facility] Pager [phone] 10/17/2015, 11:39 AM   By signing my name below, I, Rennis Harding attest that this documentation has been prepared under the direction and in the presence of Murray Hodgkins, MD Electronically signed: Rennis Harding  10/17/2015 11:50AM   I personally performed the services described in this documentation. All medical record entries made by the scribe were at my direction. I have reviewed the chart and agree that the record reflects my personal performance and is accurate and complete. Murray Hodgkins, MD

## 2015-10-17 NOTE — ED Notes (Signed)
Dr Thurnell Garbe informed at this time

## 2015-10-17 NOTE — ED Provider Notes (Signed)
CSN: [id number]     Arrival date & time 10/17/15  0847 History   First MD Initiated Contact with Patient 10/17/15 [phone]     Chief Complaint  Patient presents with  . Shortness of Breath      HPI  Pt was seen at 0905. Per EMS, NH report, family and pt: c/o sudden onset and resolution of one episode of "aspiration" that occurred PTA. NH found pt covered with emesis, low O2 Sats, increased resp rate, increased HR.  The patient has a significant history of similar symptoms previously, recently being evaluated for this complaint and multiple prior evals for same.      Past Medical History  Diagnosis Date  . Essential hypertension, benign   . Type 2 diabetes mellitus ([place])   . Osteoporosis 06/2008  . 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 Ankle surgery Left 2004    otif  . Abdominal hysterectomy    . Anterior and posterior repair N/A 03/11/2013    Procedure: ANTERIOR (CYSTOCELE) AND POSTERIOR REPAIR (RECTOCELE);  Surgeon: Linda Hedges, DO;  Location: [facility];  Service: Gynecology;  Laterality: N/A;  with sacrospinous ligament fixation bilateral  . Colonoscopy  2009    negative  . Cataract extraction w/phaco Right 12/27/2014    Procedure: CATARACT EXTRACTION PHACO AND INTRAOCULAR LENS PLACEMENT RIGHT EYE;  Surgeon: Tonny Branch, MD;  Location: [facility];  Service: Ophthalmology;  Laterality: Right;  CDE:10.63  . Cataract extraction w/phaco Left 01/13/2015    Procedure: CATARACT EXTRACTION PHACO AND INTRAOCULAR LENS PLACEMENT LEFT EYE;  Surgeon: Tonny Branch, MD;  Location: [facility];  Service: Ophthalmology;  Laterality: Left;  CDE:18.87  . Cholecystectomy    . Tee without cardioversion N/A 04/01/2015    Procedure: TRANSESOPHAGEAL ECHOCARDIOGRAM (TEE);  Surgeon: Sanda Klein, MD;  Location: [facility] ENDOSCOPY;  Service: Cardiovascular;  Laterality: N/A;   Family History  Problem Relation Age of Onset  . Cancer Father     Liver  . Cancer Sister      Breast  . Diabetes Brother   . Dementia Mother    Social History  Substance Use Topics  . Smoking status: Former Smoker -- 0.25 packs/day for 30 years    Types: Cigarettes    Quit date: 02/27/2015  . Smokeless tobacco: None  . Alcohol Use: No    Review of Systems ROS: Statement: All systems negative except as marked or noted in the HPI; Constitutional: Negative for fever and chills. ; ; Eyes: Negative for eye pain, redness and discharge. ; ; ENMT: Negative for ear pain, hoarseness, nasal congestion, sinus pressure and sore throat. ; ; Cardiovascular: Negative for chest pain, palpitations, diaphoresis, dyspnea and peripheral edema. ; ; Respiratory: +"low O2 Sats." Negative for cough, wheezing and stridor. ; ; Gastrointestinal: +N/V. Negative for diarrhea, abdominal pain, blood in stool, hematemesis, jaundice and rectal bleeding. . ; ; Genitourinary: Negative for dysuria, flank pain and hematuria. ; ; Musculoskeletal: Negative for back pain and neck pain. Negative for swelling and trauma.; ; Skin: Negative for pruritus, rash, abrasions, blisters, bruising and skin lesion.; ; Neuro: Negative for headache, lightheadedness and neck stiffness. Negative for weakness, altered level of consciousness , altered mental status, extremity weakness, paresthesias, involuntary movement, seizure and syncope.      Allergies  Review of patient's allergies indicates no known allergies.  Home Medications   Prior to Admission medications   Medication Sig Start Date End Date Taking? Authorizing  Provider  cephALEXin (KEFLEX) 500 MG capsule Take 1 capsule (500 mg total) by mouth 4 (four) times daily. 10/08/15  Yes [name], MD  collagenase (SANTYL) ointment Apply 1 application topically daily.   Yes Historical Provider, MD  enoxaparin (LOVENOX) 40 MG/0.4ML injection Inject 0.4 mLs (40 mg total) into the skin daily. 09/08/15  Yes [name], MD  FLUoxetine (PROZAC) 10 MG capsule TAKE 1 CAPSULE BY MOUTH  DAILY FOR DEPRESSION/ANXIETY 09/15/15  Yes [name], MD  ketoconazole (NIZORAL) 2 % cream Apply 1 application topically 2 (two) times daily.   Yes Historical Provider, MD  levETIRAcetam (KEPPRA) 750 MG tablet TAKE 1 TABLET BY MOUTH EVERY MORNING AND 2 TABLETS BY MOUTH EVERY NIGHT AT BEDTIME 08/01/15  Yes [name], MD  lisinopril (PRINIVIL,ZESTRIL) 2.5 MG tablet Take 1 tablet (2.5 mg total) by mouth daily. 07/12/15  Yes [name], MD  metFORMIN (GLUCOPHAGE) 500 MG tablet Take one half tablet BID Patient taking differently: Take 250 mg by mouth 2 (two) times daily with a meal.  04/25/15  Yes [name], MD  methylphenidate (RITALIN) 5 MG tablet Take one tablet per tube twice daily with breakfast and lunch 10/10/15  Yes Gildardo Cranker, DO  metoprolol tartrate (LOPRESSOR) 25 MG tablet Take 25 mg by mouth 2 (two) times daily.   Yes Historical Provider, MD  NIFEdipine (PROCARDIA XL/ADALAT-CC) 60 MG 24 hr tablet Take 1 tablet (60 mg total) by mouth daily. DO NOT CRUSH 07/12/15  Yes [name], MD  ondansetron (ZOFRAN ODT) 4 MG disintegrating tablet Take 1 tablet (4 mg total) by mouth every 8 (eight) hours as needed for nausea or vomiting. 10/08/15  Yes [name], MD  pravastatin (PRAVACHOL) 40 MG tablet Take 1 tablet (40 mg total) by mouth daily. 07/20/15  Yes [name], MD  senna-docusate (SENOKOT-S) 8.6-50 MG per tablet Take 2 tablets by mouth 2 (two) times daily. For constipation 04/22/15  Yes Ivan Anchors Love, PA-C  sodium chloride 1 G tablet Take 1 g by mouth 2 (two) times daily with a meal.   Yes Historical Provider, MD  warfarin (COUMADIN) 2 MG tablet Take 1/2 a pill on Friday evening and take a whole pill on all other days. Patient taking differently: Take 1-2 mg by mouth See admin instructions. Take 1 mg on Tues, Thurs, and Sat.  Take 2 mg on Sun, Mon, Wed, and Fri. 07/12/15  Yes [name], MD  metoprolol (LOPRESSOR) 50 MG tablet Take 1 tablet (50 mg total) by mouth 2 (two) times  daily. Patient not taking: Reported on 10/08/2015 08/26/15   [name], MD  raloxifene (EVISTA) 60 MG tablet Take 1 tablet (60 mg total) by mouth daily. Patient not taking: Reported on 10/17/2015 07/12/15   [name], MD   BP 91/41 mmHg  Pulse 99  Temp(Src) 100.5 F (38.1 C) (Rectal)  Resp 27  Wt 90 lb (40.824 kg)  SpO2 100%   Patient Vitals for the past 24 hrs:  BP Temp Temp src Pulse Resp SpO2 Weight  10/17/15 1200 (!) 100/44 mmHg - - 105 (!) 32 100 % -  10/17/15 1130 (!) 91/41 mmHg - - 99 (!) 27 100 % -  10/17/15 1116 - 100.5 F (38.1 C) Rectal - - - -  10/17/15 1111 (!) 99/44 mmHg - - 99 20 100 % -  10/17/15 1100 (!) 102/41 mmHg - - 101 (!) 29 100 % -  10/17/15 1030 (!)  106/48 mmHg - - - (!) 28 - -  10/17/15 1009 (!) 115/49 mmHg - - - (!) 31 - -  10/17/15 0952 (!) 105/44 mmHg - - 113 24 100 % -  10/17/15 0905 95/57 mmHg 100.9 F (38.3 C) Rectal 115 22 93 % 90 lb (40.824 kg)  10/17/15 0849 - - - 110 22 99 % -     Physical Exam  0910: Physical examination:  Nursing notes reviewed; Vital signs and O2 SAT reviewed;  Constitutional: Thin, frail. In no acute distress; Head:  Normocephalic, atraumatic; Eyes: EOMI, PERRL, No scleral icterus; ENMT: Mouth and pharynx normal, Mucous membranes dry.; Neck: +DSD left neck, no surrounding erythema. Supple, Full range of motion, No lymphadenopathy; Cardiovascular: Tachycardic rate and rhythm, No gallop; Respiratory: Breath sounds coarse & equal bilaterally, No wheezes. Mildly tachypneic. ; Chest: Nontender, Movement normal; Abdomen: +PEG. Soft, Nontender, Nondistended, Normal bowel sounds; Genitourinary: No CVA tenderness. +small amount of dark urine in foley bag; Extremities: Pulses normal, No tenderness, No edema, No calf edema or asymmetry.; Neuro: Awake, alert. Eyes open. Refuses to speak but will nod head to questions. Moves UE's spontaneously.; Skin: Color normal, Warm, Dry.     ED Course  Procedures (including critical care  time) Labs Review   Imaging Review  I have personally reviewed and evaluated these images and lab results as part of my medical decision-making.   EKG Interpretation   Date/Time:  Monday October 17 2015 09:00:03 EDT Ventricular Rate:  115 PR Interval:  182 QRS Duration: 102 QT Interval:  374 QTC Calculation: 517 R Axis:   -37 Text Interpretation:  Sinus tachycardia Left axis deviation Anterior  infarct, old Nonspecific T abnormalities, inferior leads Prolonged QT  interval Baseline wander When compared with ECG of 10/08/2015 QT has  lengthened Confirmed by [facility]  MD, Nunzio Cory [phone]) on 10/17/2015  12:01:22 PM      MDM  MDM Reviewed: previous chart, nursing note and vitals Reviewed previous: labs and ECG Interpretation: labs, ECG and x-ray Total time providing critical care: 30-74 minutes. This excludes time spent performing separately reportable procedures and services. Consults: admitting MD      CRITICAL CARE Performed by: Alfonzo Feller Total critical care time: 35 minutes Critical care time was exclusive of separately billable procedures and treating other patients. Critical care was necessary to treat or prevent imminent or life-threatening deterioration. Critical care was time spent personally by me on the following activities: development of treatment plan with patient and/or surrogate as well as nursing, discussions with consultants, evaluation of patient's response to treatment, examination of patient, obtaining history from patient or surrogate, ordering and performing treatments and interventions, ordering and review of laboratory studies, ordering and review of radiographic studies, pulse oximetry and re-evaluation of patient's condition.   Results for orders placed or performed during the hospital encounter of 10/17/15  Comprehensive metabolic panel  Result Value Ref Range   Sodium 128 (L) 135 - 145 mmol/L   Potassium 5.2 (H) 3.5 - 5.1 mmol/L    Chloride 93 (L) 101 - 111 mmol/L   CO2 26 22 - 32 mmol/L   Glucose, Bld 255 (H) 65 - 99 mg/dL   BUN 27 (H) 6 - 20 mg/dL   Creatinine, Ser 0.63 0.44 - 1.00 mg/dL   Calcium 7.7 (L) 8.9 - 10.3 mg/dL   Total Protein 5.1 (L) 6.5 - 8.1 g/dL   Albumin 2.0 (L) 3.5 - 5.0 g/dL   AST 31 15 - 41 U/L   ALT  28 14 - 54 U/L   Alkaline Phosphatase 51 38 - 126 U/L   Total Bilirubin 0.4 0.3 - 1.2 mg/dL   GFR calc non Af Amer >60 >60 mL/min   GFR calc Af Amer >60 >60 mL/min   Anion gap 9 5 - 15  Lipase, blood  Result Value Ref Range   Lipase 33 11 - 51 U/L  Brain natriuretic peptide  Result Value Ref Range   B Natriuretic Peptide 118.0 (H) 0.0 - 100.0 pg/mL  Troponin I  Result Value Ref Range   Troponin I <0.03 <0.031 ng/mL  Lactic acid, plasma  Result Value Ref Range   Lactic Acid, Venous 3.4 (HH) 0.5 - 2.0 mmol/L  CBC with Differential  Result Value Ref Range   WBC 22.0 (H) 4.0 - 10.5 K/uL   RBC 3.99 3.87 - 5.11 MIL/uL   Hemoglobin 10.5 (L) 12.0 - 15.0 g/dL   HCT 32.1 (L) 36.0 - 46.0 %   MCV 80.5 78.0 - 100.0 fL   MCH 26.3 26.0 - 34.0 pg   MCHC 32.7 30.0 - 36.0 g/dL   RDW 14.3 11.5 - 15.5 %   Platelets 516 (H) 150 - 400 K/uL   Neutrophils Relative % 94 %   Neutro Abs 20.8 (H) 1.7 - 7.7 K/uL   Lymphocytes Relative 4 %   Lymphs Abs 0.8 0.7 - 4.0 K/uL   Monocytes Relative 2 %   Monocytes Absolute 0.5 0.1 - 1.0 K/uL   Eosinophils Relative 0 %   Eosinophils Absolute 0.0 0.0 - 0.7 K/uL   Basophils Relative 0 %   Basophils Absolute 0.0 0.0 - 0.1 K/uL  Urinalysis, Routine w reflex microscopic  Result Value Ref Range   Color, Urine YELLOW YELLOW   APPearance CLEAR CLEAR   Specific Gravity, Urine 1.020 1.005 - 1.030   pH 5.5 5.0 - 8.0   Glucose, UA NEGATIVE NEGATIVE mg/dL   Hgb urine dipstick MODERATE (A) NEGATIVE   Bilirubin Urine NEGATIVE NEGATIVE   Ketones, ur TRACE (A) NEGATIVE mg/dL   Protein, ur TRACE (A) NEGATIVE mg/dL   Urobilinogen, UA 0.2 0.0 - 1.0 mg/dL   Nitrite NEGATIVE  NEGATIVE   Leukocytes, UA MODERATE (A) NEGATIVE  Urine microscopic-add on  Result Value Ref Range   Squamous Epithelial / LPF FEW (A) RARE   WBC, UA 21-50 <3 WBC/hpf   RBC / HPF 11-20 <3 RBC/hpf   Urine-Other MANY YEAST    Dg Abd Acute W/chest 10/17/2015  CLINICAL DATA:  Nausea and vomiting, possible aspiration. EXAM: DG ABDOMEN ACUTE W/ 1V CHEST COMPARISON:  Chest x-ray of October 13, 2015 FINDINGS: Chest x-ray: The lungs are adequately inflated. There are increased densities in the lower lobes bilaterally. These are more conspicuous than in the past. The heart is top-normal in size but stable. The pulmonary vascularity is normal. The mediastinum is normal in width. Within the abdomen there is a moderate amount of contrast noted throughout the normal caliber colon. A gastrostomy tube is in place. The stomach and small bowel do not appear abnormally distended. There is gentle levocurvature of the mid lumbar spine. IMPRESSION: 1. Increased density at both lung bases worrisome for the clinically suspected aspiration and development of atelectasis or pneumonia. Stable enlargement cardiac silhouette without pulmonary edema. 2. The bowel gas pattern is within the limits of normal with exception of the presence of a moderate amount of contrast in the normal calibered colon. This contrast may be residual from previous studies. I do not  have history of recent oral contrast administration. Electronically Signed   By: David  Martinique M.D.   On: 10/17/2015 09:50   Results for GISELL, BUEHRLE (MRN [id number]) as of 10/17/2015 12:17  Ref. Range 10/17/2015 06:30  Prothrombin Time Latest Ref Range: 11.6-15.2 seconds 38.5 (H)  INR Latest Ref Range: 0.00-1.49  4.08 (H)   Results for INSIYA, OSHEA (MRN [id number]) as of 10/17/2015 12:17  Ref. Range 10/08/2015 04:30 10/08/2015 08:51 10/12/2015 05:50 10/14/2015 07:35 10/17/2015 09:45  Sodium Latest Ref Range: 135-145 mmol/L 138 135 129 (L) 128 (L) 128 (L)    1125:   Sepsis orders completed with IV abx for HCAP/aspiration pneumonia. UC and BC x2 are pending. New hyponatremia on labs for past several days and pt's BP labile; judicious IVF continued. Sats improved to 100% on O2 N/C. Will need coumadin held today. Family (husband and daughter) unsure regarding DNR/DNI at this time. T/C to [facility] Dr. Sarajane Jews, case discussed, including:  HPI, pertinent PM/SHx, VS/PE, dx testing, ED course and treatment:  Agreeable to admit, requests to write temporary orders, obtain stepdown bed to team [facility].    Francine Graven, DO 10/19/15 1438

## 2015-10-17 NOTE — Progress Notes (Signed)
Patient ID: Laura Melendez, female   DOB: [birth year], 74 y.o.   MRN: [id number]   Facility; [facility] Chief complaint; vomiting with hypoxemia History; this is a patient who we admitted to the facility on 10/21. She did come from [facility] where she underwent a left parotidectomy. Postoperative course was complicated by a left neck hematoma that required surgical incision on and evacuation on 10/4 on postoperative day one. Subsequently she developed aspiration pneumonia a right pneumothorax and intubation. Respiratory culture ultimately glue Escherichia coli and Klebsiella and she was treated for pneumonia. Subsequently the patient developed pulmonary edema and echocardiogram showed no signs of congestive heart failure. She ultimately required a PEG tube on 10/17. She had a fees study before she left that showed that she was cleared for a diet however on her arrival here she has not been able to tolerate oral feeds and has been nothing by mouth after failing a swallowing study with gross aspiration. I saw her last week for vomiting with concerns of aspiration and a chest x-ray was clear she ultimately stabilized. This morning she was found covered in the large amount of vomit. Her respiratory status has deteriorated.  Past Medical History  Diagnosis Date  . Essential hypertension, benign   . Type 2 diabetes mellitus ([place])   . Osteoporosis 06/2008  . Stroke [facility])    Past Surgical History  Procedure Laterality Date  . Ankle surgery Left 2004    otif  . Abdominal hysterectomy    . Anterior and posterior repair N/A 03/11/2013    Procedure: ANTERIOR (CYSTOCELE) AND POSTERIOR REPAIR (RECTOCELE);  Surgeon: Linda Hedges, DO;  Location: [facility];  Service: Gynecology;  Laterality: N/A;  with sacrospinous ligament fixation bilateral  . Colonoscopy  2009    negative  . Cataract extraction w/phaco Right 12/27/2014    Procedure: CATARACT EXTRACTION PHACO AND INTRAOCULAR LENS PLACEMENT RIGHT EYE;   Surgeon: Tonny Branch, MD;  Location: [facility];  Service: Ophthalmology;  Laterality: Right;  CDE:10.63  . Cataract extraction w/phaco Left 01/13/2015    Procedure: CATARACT EXTRACTION PHACO AND INTRAOCULAR LENS PLACEMENT LEFT EYE;  Surgeon: Tonny Branch, MD;  Location: [facility];  Service: Ophthalmology;  Laterality: Left;  CDE:18.87  . Cholecystectomy    . Tee without cardioversion N/A 04/01/2015    Procedure: TRANSESOPHAGEAL ECHOCARDIOGRAM (TEE);  Surgeon: Sanda Klein, MD;  Location: [facility] ENDOSCOPY;  Service: Cardiovascular;  Laterality: N/A;    Current medications Coumadin 1 mg Tuesday Thursday Saturday and 2 mg Sunday Monday Wednesday Friday Keppra 752 tablets equal 1500 mg in evening Lisinopril 2.5 daily Lovenox 40 mg once a day Metformin 250 twice a day Metoprolol 25 twice a day Nifedipine extended release 60 a day Pravastatin 40 daily Prozac 10 daily Ritalin 5 mg every morning and every noon  Review of systems; not really possible from the patient due to respiratory distress  Physical examination O2 sat is 67% on 2 L, respirations 30 pulse rate 125 Gen. patient is tachycardic and tachypneic Respiratory gross crackles in the left lower lobe greater than right lower lobe. There is no wheezing there is excess 3 muscle use Cardiac pulse rate at 125. Early systolic murmur Abdomen; somewhat distended and quiet diffusely tender no guarding GU bladder is not distended no clear CVA tenderness Extremities; no evidence of a DVT  Impression/plan #1 gross aspiration pneumonitis. She is in respiratory distress she will need to be sent to the hospital. #2 this is a second time I've seen her for vomiting  associated with her tube feeding. I am not sure of the exact etiology here however her abdominal examination is not benign #3 severe dysphagia. The patient suffered a stroke in April of this year however she passed a swallowing study at that time. I had ordered a repeat MRI on her which was performed  on 10/27. This did not show an acute event that did show strokes in the right thalamus and left cerebellum.  The patient is hypoxemic, tachycardic. She will need to be sent to the hospital.

## 2015-10-17 NOTE — ED Notes (Signed)
Critical lab result-  Lactic Acid 3.4

## 2015-10-17 NOTE — ED Notes (Signed)
Pt here from [facility] for evaluation of low oxygen saturation and increased heart rate

## 2015-10-17 NOTE — ED Notes (Signed)
Received critical result Lactic Acid 4.2-- Dr Thurnell Garbe informed of results at this time

## 2015-10-18 DIAGNOSIS — G8191 Hemiplegia, unspecified affecting right dominant side: Secondary | ICD-10-CM

## 2015-10-18 LAB — GLUCOSE, CAPILLARY
GLUCOSE-CAPILLARY: 73 mg/dL (ref 65–99)
GLUCOSE-CAPILLARY: 185 mg/dL — AB (ref 65–99)
GLUCOSE-CAPILLARY: 129 mg/dL — AB (ref 65–99)
GLUCOSE-CAPILLARY: 81 mg/dL (ref 65–99)
GLUCOSE-CAPILLARY: 129 mg/dL — AB (ref 65–99)
Glucose-Capillary: 60 mg/dL — ABNORMAL LOW (ref 65–99)
Glucose-Capillary: 97 mg/dL (ref 65–99)

## 2015-10-18 LAB — URINE CULTURE: Culture: >=100000

## 2015-10-18 LAB — CBC
HCT: 22.6 % — ABNORMAL LOW (ref 36.0–46.0)
HEMOGLOBIN: 7.4 g/dL — AB (ref 12.0–15.0)
MCH: 26.6 pg (ref 26.0–34.0)
MCHC: 32.7 g/dL (ref 30.0–36.0)
MCV: 81.3 fL (ref 78.0–100.0)
PLATELETS: 411 10*3/uL — AB (ref 150–400)
RBC: 2.78 MIL/uL — AB (ref 3.87–5.11)
RDW: 14.7 % (ref 11.5–15.5)
WBC: 12.9 10*3/uL — AB (ref 4.0–10.5)

## 2015-10-18 LAB — BASIC METABOLIC PANEL
ANION GAP: 5 (ref 5–15)
BUN: 17 mg/dL (ref 6–20)
CALCIUM: 7.3 mg/dL — AB (ref 8.9–10.3)
CO2: 25 mmol/L (ref 22–32)
CREATININE: 0.54 mg/dL (ref 0.44–1.00)
Chloride: 107 mmol/L (ref 101–111)
Glucose, Bld: 75 mg/dL (ref 65–99)
Potassium: 3.7 mmol/L (ref 3.5–5.1)
SODIUM: 137 mmol/L (ref 135–145)

## 2015-10-18 LAB — PROTIME-INR
INR: 4.56 — AB (ref 0.00–1.49)
PROTHROMBIN TIME: 41.9 s — AB (ref 11.6–15.2)

## 2015-10-18 MED ORDER — VITAL HIGH PROTEIN PO LIQD: 1000.0000 mL | ORAL | Status: DC

## 2015-10-18 MED ORDER — ADULT MULTIVITAMIN LIQUID CH
5.0000 mL | Freq: Every day | ORAL | Status: DC
  Filled 2015-10-18 (×2): qty 5

## 2015-10-18 MED ORDER — ONDANSETRON HCL 4 MG/2ML IJ SOLN: 4.0000 mg | Freq: Four times a day (QID) | INTRAMUSCULAR | Status: DC | PRN

## 2015-10-18 MED ORDER — OSMOLITE 1.5 CAL PO LIQD
1000.0000 mL | ORAL | Status: DC
  Administered 2015-10-18: 1000 mL
  Filled 2015-10-18 (×2): qty 1000

## 2015-10-18 MED ORDER — ADULT MULTIVITAMIN LIQUID CH
5.0000 mL | Freq: Every day | ORAL | Status: DC
Start: 2015-10-18 — End: 2015-10-18
  Filled 2015-10-18 (×2): qty 5

## 2015-10-18 MED ORDER — DEXTROSE 50 % IV SOLN
INTRAVENOUS | Status: AC
  Administered 2015-10-18: 50 mL
  Filled 2015-10-18: qty 50

## 2015-10-18 MED ORDER — PRO-STAT SUGAR FREE PO LIQD
30.0000 mL | Freq: Two times a day (BID) | ORAL | Status: DC
  Administered 2015-10-18 – 2015-10-19 (×2): 30 mL
  Filled 2015-10-18: qty 30

## 2015-10-18 MED ORDER — ELDERTONIC PO ELIX
15.0000 mL | ORAL_SOLUTION | Freq: Every day | ORAL | Status: DC
  Administered 2015-10-18 – 2015-10-21 (×3): 15 mL via ORAL
  Filled 2015-10-18 (×7): qty 15

## 2015-10-18 NOTE — Consult Note (Signed)
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 Hospitalization was complicated by left neck hematoma that required surgical incision on and evacuation on 10/4 on postoperative day one and has been left to heal by secondary intention. Subsequently she developed aspiration pneumonia, right pneumothorax requiring chest tube and intubation. Respiratory culture ultimately glue Escherichia coli and Klebsiella and she was treated for pneumonia. Subsequently the patient developed pulmonary edema and echocardiogram showed no signs of congestive heart failure. She ultimately required a PEG tube on 10/17 for dysphagia.  Wound type:Nonhealing surgical wound to left neck.   Device related pressure injury to right heel, stage 1 from catheter tubing Erythema to right groin, consistent with fungal overgrowth Pressure Ulcer POA: No Measurement:Left neck 6 cm x 1 cm x 0.5 cm 0.5 cm x 5 cm intact nonblanchable redness to right heel/medial foot from foley catheter tubing.  Patient right let stays tucked in close to her body and is at risk for breakdown.  WIll implement Prevalon boot to right foot.  Interdry Ag sheet to right groin Measure and cut length of InterDry Ag+ to fit in skin folds that have skin breakdown Tuck InterDry  Ag+ fabric into skin folds in a single layer, allow for 2 inches of overhang from skin edges to allow for wicking to occur May remove to bathe; dry area thoroughly and then tuck into affected areas again Do not apply any creams or ointments when using InterDry Ag+ DO NOT THROW AWAY FOR 5 DAYS unless soiled with stool DO NOT [facility] product, this will inactivate the silver in the material  New sheet of Interdry Ag+ should be applied after 5 days of use if patient continues to have skin breakdown   Wound QGB:EEFE pink Drainage (amount, consistency, odor) Minimal   Periwound:INtact Dressing procedure/placement/frequency: Cleanse left neck with NS and pat gently dry.  Gently fill wound with Iodoform packing strip.  Cover with 4x4 gauze and secure with tape.  Change daily.  PRevalon boot to right foot/heel to offload pressure Interdry Ag to right groin area.  Will not follow at this time.  Please re-consult if needed.  Domenic Moras RN BSN [place] Pager [phone]

## 2015-10-18 NOTE — Care Management Note (Signed)
Case Management Note  Patient Details  Name: Laura Melendez MRN: [id number] Date of Birth: April 14, 1941  Subjective/Objective:                  Pt admitted for pnu. Pt is from [facility].   Action/Plan: Anticipate return to SNF at [place] is aware and will arrange for return to facility. No CM needs anticipated.   Expected Discharge Date:  10/21/15               Expected Discharge Plan:  Skilled Nursing Facility  In-House Referral:  Clinical Social Work  Discharge planning Services  CM Consult  Post Acute Care Choice:  NA Choice offered to:  NA  DME Arranged:    DME Agency:     HH Arranged:    [place] Agency:     Status of Service:  Completed, signed off  Medicare Important Message Given:    Date Medicare IM Given:    Medicare IM give by:    Date Additional Medicare IM Given:    Additional Medicare Important Message give by:     If discussed at [place] of Stay Meetings, dates discussed:    Additional Comments:  Sherald Barge, RN 10/18/2015, 3:05 PM

## 2015-10-18 NOTE — Clinical Social Work Note (Signed)
Clinical Social Work Assessment  Patient Details  Name: Laura Melendez MRN: [id number] Date of Birth: [birth year]  Date of referral:  10/18/15               Reason for consult:  Facility Placement                Permission sought to share information with:    Permission granted to share information::     Name::        Agency::     Relationship::     Contact Information:     Housing/Transportation Living arrangements for the past 2 months:  [place] of Information:  Spouse Patient Interpreter Needed:  None Criminal Activity/Legal Involvement Pertinent to Current Situation/Hospitalization:  No - Comment as needed Significant Relationships:  Spouse Lives with:  Facility Resident Do you feel safe going back to the place where you live?  Yes Need for family participation in patient care:  Yes (Comment)  Care giving concerns:  Pt is resident at [facility]    Social Worker assessment / plan:  CSW met with pt's husband at bedside in ICU. Pt sleeping during visit. Husband reports pt has been a resident at [facility] for the past few weeks for rehab. She had surgery to remove a tumor at [facility] and had several complications after this. Prior to that hospitalization, pt was independent at home. She had a stroke in April and had been receiving outpatient PT. Pt had PEG tube placed about a month ago. Her husband shared that she has not been able to do much rehab because she has been so weak. He has completed bed hold for pt to return to [facility] at d/c. Husband is aware of need for insurance re-authorization. Per Marianna Fuss at facility, pt is okay to return and no FL2 needed. Will discuss PT evaluation when appropriate and [facility] will complete authorization.   Employment status:  Retired Nurse, adult PT Recommendations:  Not assessed at this time Information / Referral to community resources:  Other (Comment Required) (return to [facility])  Patient/Family's Response to care:   Husband requests return to [facility]. He shares that it has been very hard having her at SNF and states, "If I can just get her home, it would be better." Support provided.   Patient/Family's Understanding of and Emotional Response to Diagnosis, Current Treatment, and Prognosis:  Pt's husband is aware of admission diagnosis and treatment plan. He remains very hopeful to take pt home from rehab once complete.   Emotional Assessment Appearance:  Appears stated age Attitude/Demeanor/Rapport:  Unable to Assess Affect (typically observed):  Unable to Assess Orientation:    Alcohol / Substance use:  Not Applicable Psych involvement (Current and /or in the community):  No (Comment)  Discharge Needs  Concerns to be addressed:  Discharge Planning Concerns Readmission within the last 30 days:  No Current discharge risk:  Physical Impairment Barriers to Discharge:  Continued Medical Work up   Salome Arnt, [place] 10/18/2015, 9:39 AM [phone]

## 2015-10-18 NOTE — Progress Notes (Signed)
ANTICOAGULATION CONSULT NOTE   Pharmacy Consult for Coumadin Indication: aortic clot  No Known Allergies  Patient Measurements: Height: 5\' 2"  (157.5 cm) Weight: 92 lb 13 oz (42.1 kg) IBW/kg (Calculated) : 50.1  Vital Signs: Temp: 98.5 F (36.9 C) (11/01 0804) Temp Source: Axillary (11/01 0804) BP: 142/58 mmHg (11/01 1000) Pulse Rate: 79 (11/01 1000)  Labs:  Recent Labs  10/17/15 0630 10/17/15 0945 10/17/15 1418 10/18/15 0508  HGB  --  10.5*  --  7.4*  HCT  --  32.1*  --  22.6*  PLT  --  516*  --  411*  APTT  --   --  56*  --   LABPROT 38.5*  --  45.9* 41.9*  INR 4.08*  --  5.14* 4.56*  CREATININE  --  0.63  --  0.54  TROPONINI  --  <0.03  --   --    Estimated Creatinine Clearance: 41 mL/min (by C-G formula based on Cr of 0.54).  Medical History: Past Medical History  Diagnosis Date  . Essential hypertension, benign   . Type 2 diabetes mellitus ([place])   . Osteoporosis 06/2008  . 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 History of parotid cancer    Medications:  Prescriptions prior to admission  Medication Sig Dispense Refill Last Dose  . cephALEXin (KEFLEX) 500 MG capsule Take 1 capsule (500 mg total) by mouth 4 (four) times daily. 28 capsule 0 10/17/2015 at Unknown time  . collagenase (SANTYL) ointment Apply 1 application topically daily.   10/16/2015 at Unknown time  . enoxaparin (LOVENOX) 40 MG/0.4ML injection Inject 0.4 mLs (40 mg total) into the skin daily. 14 Syringe 0 10/16/2015 at Unknown time  . FLUoxetine (PROZAC) 10 MG capsule TAKE 1 CAPSULE BY MOUTH DAILY FOR DEPRESSION/ANXIETY 90 capsule 1 10/16/2015 at Unknown time  . ketoconazole (NIZORAL) 2 % cream Apply 1 application topically 2 (two) times daily.   10/16/2015 at Unknown time  . levETIRAcetam (KEPPRA) 750 MG tablet TAKE 1 TABLET BY MOUTH EVERY MORNING AND 2 TABLETS BY MOUTH EVERY NIGHT AT BEDTIME 270 tablet 3 10/16/2015 at Unknown time  . lisinopril (PRINIVIL,ZESTRIL) 2.5 MG tablet Take 1  tablet (2.5 mg total) by mouth daily. 30 tablet 5 10/16/2015 at Unknown time  . metFORMIN (GLUCOPHAGE) 500 MG tablet Take one half tablet BID (Patient taking differently: Take 250 mg by mouth 2 (two) times daily with a meal. ) 30 tablet 5 10/16/2015 at Unknown time  . methylphenidate (RITALIN) 5 MG tablet Take one tablet per tube twice daily with breakfast and lunch 60 tablet 0 10/16/2015 at Unknown time  . metoprolol tartrate (LOPRESSOR) 25 MG tablet Take 25 mg by mouth 2 (two) times daily.   10/16/2015 at 0900  . NIFEdipine (PROCARDIA XL/ADALAT-CC) 60 MG 24 hr tablet Take 1 tablet (60 mg total) by mouth daily. DO NOT CRUSH 30 tablet 5 10/16/2015 at Unknown time  . ondansetron (ZOFRAN ODT) 4 MG disintegrating tablet Take 1 tablet (4 mg total) by mouth every 8 (eight) hours as needed for nausea or vomiting. 20 tablet 0 10/16/2015 at Unknown time  . pravastatin (PRAVACHOL) 40 MG tablet Take 1 tablet (40 mg total) by mouth daily. 30 tablet 12 10/16/2015 at Unknown time  . senna-docusate (SENOKOT-S) 8.6-50 MG per tablet Take 2 tablets by mouth 2 (two) times daily. For constipation 100 tablet 1 10/16/2015 at Unknown time  . sodium chloride 1 G tablet Take 1 g by mouth 2 (two) times  daily with a meal.   10/16/2015 at Unknown time  . warfarin (COUMADIN) 2 MG tablet Take 1/2 a pill on Friday evening and take a whole pill on all other days. (Patient taking differently: Take 1-2 mg by mouth See admin instructions. Take 1 mg on Tues, Thurs, and Sat.  Take 2 mg on Sun, Mon, Wed, and Fri.) 30 tablet 5 10/16/2015 at Unknown time  . metoprolol (LOPRESSOR) 50 MG tablet Take 1 tablet (50 mg total) by mouth 2 (two) times daily. (Patient not taking: Reported on 10/08/2015) 60 tablet 5 Not Taking at Unknown time  . raloxifene (EVISTA) 60 MG tablet Take 1 tablet (60 mg total) by mouth daily. 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  Goal of Therapy:  INR 2-3 Monitor platelets by anticoagulation protocol: Yes   Plan: Hold Coumadin PT/INR daily Restart coumadin when Johnston Ebbs, PharmD  10/18/2015,10:47 AM

## 2015-10-18 NOTE — Progress Notes (Signed)
Initial Nutrition Assessment  DOCUMENTATION CODES:   Non-severe (moderate) malnutrition in context of chronic illness   Underweight  INTERVENTION:  Continuous Osmolite 1.5 @ 20 ml/hr via PEG an increase 10 ml as tolerated to goal rate of 30 ml/hr.  Add 30 ml Prostat BID via PEG.    Tube feeding regimen provides 1280 kcal (100% of needs), 74  grams of protein, and 548  ml of H2O.    NS @ 75 ml/hr currently.   If no IVF add free water -200 ml QID and 30 ml water before and after medications   NUTRITION DIAGNOSIS:   Predicted suboptimal nutrient intake related to altered GI function as evidenced by NPO status.  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 She has PEG tube which was placed during hospitalization recently at [facility]. She has had difficulty tolerating advancement of enteral feeding and had been vomiting. She is now admitted with aspiration pneumonia. She is NPO and failing MBSS and dependent on EN for 100% of nutrition intake until she is able to resume oral feeding per ST. She has been cleared to resume tube feedings now. Will initiate at conservative rate.  Diet Order:  Diet NPO time specified  Skin:   surgical wound to neck with minimal drainage per WOC  Last BM:   10/31  Height:   Ht Readings from Last 1 Encounters:  10/17/15 5\' 2"  (1.575 m)    Weight:   Wt Readings from Last 1 Encounters:  10/18/15 92 lb 13 oz (42.1 kg)    Ideal Body Weight:  50 kg  BMI:  Body mass index is 16.97 kg/(m^2).  Estimated Nutritional Needs:   Kcal:  1260  Protein:  63-70 gr  Fluid:  1.3 liters daily  EDUCATION NEEDS:   No education needs identified at this time   Colman Cater MS,RD,CSG,LDN Office: #[phone] Pager: [phone]

## 2015-10-18 NOTE — Progress Notes (Signed)
PT Cancellation Note  Patient Details Name: Laura Melendez MRN: [id number] DOB: 10-Nov-1941   Cancelled Treatment:    Reason Eval/Treat Not Completed: Patient's level of consciousness.  PT evaluation was attempted, pt sleeping.  Husband present so I interview him. She had a stroke in April which left her with right hemiparesis.  She was able to discharge to home after IP Rehab and walked with HHA.  (She could not coordinate use of a cane or [place]).  She ended up at [facility] about a month ago for excision of left parotid gland.  She had multiple complications from this and after 3 weeks d/c'd to [facility].  She has only been there a week but had frequent bouts of nausea/vomiting from Peg tube feedings and was unable to participate in PT.  She has essentially not walked since her admission to [facility].  Husband has had to assist her with all ADLs.  I attempted to work with her for evaluation where she was able to awaken but was very "groggy".  She was unable to follow any directions.  I am attributing this to fatigue and we will try again tomorrow.  Pt's husband hopes to have her return to [facility] at d/c.   Demetrios Isaacs L  PT 10/18/2015, 3:22 PM [phone]

## 2015-10-18 NOTE — Progress Notes (Signed)
PROGRESS NOTE  Laura Melendez UJW:[id number] DOB: 06-18-1941 DOA: 10/17/2015 PCP: [name], MD  Summary: 89 yof with PMH HTN, DM, Stroke 03/2015 with residual right-sided weakness presents from [facility] with complaints of vomiting. ED issues: acute hypoxic resp failure, sepsis, aspiration pneumonia.  Assessment/Plan: 1. Sepsis secondary aspiration pneumonia, Presumed from tube feeds. Improving with abx and IVF.  Blood cultures pending.   2. Acute hypoxic respiratory failure. Resolved.  3. Hyponatremia, hyperkalemia. Resolved with IVF. 4. Anemia of chronic disease. Hgb trending down, 7.4. No obvious source of bleeding. Plan transfusion if Hgb belows 7.0. 5. Pyuria. Urine culture pending.  6. Parotid gland cancer s/p left parotidectomy at [facility] with complicated postoperative course as outline by Dr. Dellia Nims 10/31 including aspiration pneumonia, pneumothorax, intubation, ultimately required PEG tube 7. DM type 2, one episode of hyperglycemia. 8. Seizure disorder on Keppra. 9. PMH stroke 03/2015, on warfarin. 10. Elevated INR. Now trending down monitor on abx, warfarin on hold.    Overall much improved, now off oxygen and is hemodynamically stable  Continue abx  Continue wound care   Restart tube feeds  Transfer to medical floor    Code Status: Full DVT prophylaxis: Warfarin  Family Communication: Discussed with patient who understands and has no concerns at this time. Disposition Plan: Transfer to medical bed  Murray Hodgkins, MD  [facility]  Pager [phone] If 7PM-7AM, please contact night-coverage at [email or web address], password [facility] 10/18/2015, 6:35 AM  LOS: 1 day   Consultants:    Procedures:    Antibiotics:    HPI/Subjective: Doing better was able to sleep without difficulty. Denies any pain, nausea, vomiting, cough or SOB.  Objective: Filed Vitals:   10/18/15 0200 10/18/15 0300 10/18/15 0400 10/18/15 0500  BP: 113/44 115/45 118/49 120/49    Pulse: 83 82 80 78  Temp:   97.3 F (36.3 C)   TempSrc:   Axillary   Resp: 21 13 12 17   Height:      Weight:    42.1 kg (92 lb 13 oz)  SpO2: 98% 100% 100% 100%    Intake/Output Summary (Last 24 hours) at 10/18/15 0635 Last data filed at 10/18/15 0500  Gross per 24 hour  Intake    678 ml  Output    650 ml  Net     28 ml     Filed Weights   10/17/15 0905 10/17/15 1353 10/18/15 0500  Weight: 40.824 kg (90 lb) 42.3 kg (93 lb 4.1 oz) 42.1 kg (92 lb 13 oz)    Exam:  Afebrile, VSS, not hypoxic on [place]  General:  Appears calm and comfortable. Lying in bed  Eyes: PERRL, normal lids, irises & conjunctiva Cardiovascular: RRR, no m/r/g. No LE edema. Telemetry: SR Respiratory: CTA bilaterally, no w/r/r. Normal respiratory effort. Abdomen: soft, ntnd, PEG in place Skin: No change  Psychiatric: grossly normal mood and affect, speech fluent and appropriate  New data reviewed:  Sodium 137  Potassium 3.7  Hgb down to  7.4  WBC down to 12.9  INR down to 4.56  Platelets 411  CBG 60 this am  Now 185  Pertinent data since admission:  Lactic acid 4.2  Pending data:  UC   BC  Scheduled Meds: . antiseptic oral rinse  7 mL Mouth Rinse q12n4p  . chlorhexidine  15 mL Mouth Rinse BID  . insulin aspart  0-9 Units Subcutaneous 6 times per day  . levETIRacetam  1,500 mg Intravenous Q12H  . piperacillin-tazobactam (ZOSYN)  IV  3.375 g Intravenous Q8H  . sodium chloride  3 mL Intravenous Q12H  . vancomycin  750 mg Intravenous Q24H  . Warfarin - Pharmacist Dosing Inpatient   Does not apply q1800   Continuous Infusions: . sodium chloride 75 mL/hr at 10/17/15 2100    Principal Problem:   Aspiration pneumonia ([place]) Active Problems:   DM (diabetes mellitus) ([place])   Right hemiparesis ([place])   Sepsis ([place])   Acute respiratory failure with hypoxia ([place])   Time spent 25 miinutes   By signing my name below, I, Rennis Harding attest that this documentation has been  prepared under the direction and in the presence of Murray Hodgkins, MD Electronically signed: Rennis Harding  10/18/2015 10:04am   I personally performed the services described in this documentation. All medical record entries made by the scribe were at my direction. I have reviewed the chart and agree that the record reflects my personal performance and is accurate and complete. Murray Hodgkins, MD

## 2015-10-18 NOTE — Progress Notes (Signed)
PT'S CBG 60, 1 AMP D50 GIVEN

## 2015-10-19 DIAGNOSIS — A419 Sepsis, unspecified organism: Principal | ICD-10-CM

## 2015-10-19 DIAGNOSIS — E440 Moderate protein-calorie malnutrition: Secondary | ICD-10-CM | POA: Insufficient documentation

## 2015-10-19 DIAGNOSIS — J690 Pneumonitis due to inhalation of food and vomit: Secondary | ICD-10-CM

## 2015-10-19 DIAGNOSIS — J9601 Acute respiratory failure with hypoxia: Secondary | ICD-10-CM

## 2015-10-19 DIAGNOSIS — E876 Hypokalemia: Secondary | ICD-10-CM

## 2015-10-19 LAB — C DIFFICILE QUICK SCREEN W PCR REFLEX
C DIFFICILE (CDIFF) INTERP: NEGATIVE
C DIFFICILE (CDIFF) TOXIN: NEGATIVE
C DIFFICLE (CDIFF) ANTIGEN: NEGATIVE

## 2015-10-19 LAB — BASIC METABOLIC PANEL
ANION GAP: 10 (ref 5–15)
BUN: 16 mg/dL (ref 6–20)
CALCIUM: 7 mg/dL — AB (ref 8.9–10.3)
CO2: 17 mmol/L — ABNORMAL LOW (ref 22–32)
Chloride: 109 mmol/L (ref 101–111)
Creatinine, Ser: 0.56 mg/dL (ref 0.44–1.00)
Glucose, Bld: 169 mg/dL — ABNORMAL HIGH (ref 65–99)
POTASSIUM: 3.3 mmol/L — AB (ref 3.5–5.1)
Sodium: 136 mmol/L (ref 135–145)

## 2015-10-19 LAB — CBC
HCT: 34.1 % — ABNORMAL LOW (ref 36.0–46.0)
Hemoglobin: 11 g/dL — ABNORMAL LOW (ref 12.0–15.0)
MCH: 26.1 pg (ref 26.0–34.0)
MCHC: 32.3 g/dL (ref 30.0–36.0)
MCV: 81 fL (ref 78.0–100.0)
PLATELETS: 641 10*3/uL — AB (ref 150–400)
RBC: 4.21 MIL/uL (ref 3.87–5.11)
RDW: 14.7 % (ref 11.5–15.5)
WBC: 17.6 10*3/uL — AB (ref 4.0–10.5)

## 2015-10-19 LAB — GLUCOSE, CAPILLARY
GLUCOSE-CAPILLARY: 144 mg/dL — AB (ref 65–99)
GLUCOSE-CAPILLARY: 146 mg/dL — AB (ref 65–99)
GLUCOSE-CAPILLARY: 156 mg/dL — AB (ref 65–99)
GLUCOSE-CAPILLARY: 225 mg/dL — AB (ref 65–99)
Glucose-Capillary: 162 mg/dL — ABNORMAL HIGH (ref 65–99)
Glucose-Capillary: 166 mg/dL — ABNORMAL HIGH (ref 65–99)

## 2015-10-19 LAB — PROTIME-INR
INR: 6.59 — AB (ref 0.00–1.49)
Prothrombin Time: 55.3 seconds — ABNORMAL HIGH (ref 11.6–15.2)

## 2015-10-19 MED ORDER — METOCLOPRAMIDE HCL 5 MG/ML IJ SOLN
10.0000 mg | Freq: Four times a day (QID) | INTRAMUSCULAR | Status: DC
  Administered 2015-10-19 – 2015-10-21 (×9): 10 mg via INTRAVENOUS
  Filled 2015-10-19 (×9): qty 2

## 2015-10-19 MED ORDER — LOPERAMIDE HCL 2 MG PO CAPS
2.0000 mg | ORAL_CAPSULE | ORAL | Status: DC | PRN
  Administered 2015-10-20: 2 mg via ORAL
  Filled 2015-10-19: qty 1

## 2015-10-19 MED ORDER — VITAL AF 1.2 CAL PO LIQD
1000.0000 mL | ORAL | Status: DC
  Administered 2015-10-19 – 2015-10-20 (×2): 1000 mL
  Filled 2015-10-19 (×3): qty 1000

## 2015-10-19 MED ORDER — SODIUM CHLORIDE 0.45 % IV SOLN
INTRAVENOUS | Status: DC
  Administered 2015-10-19 – 2015-10-20 (×2): via INTRAVENOUS
  Filled 2015-10-19 (×6): qty 1000

## 2015-10-19 NOTE — Progress Notes (Signed)
Tolerating tube feeding at 16ml/hr on pump.  HOB elevated.  Patient talking with husband.

## 2015-10-19 NOTE — Progress Notes (Signed)
ANTICOAGULATION CONSULT NOTE   Pharmacy Consult for Coumadin Indication: aortic clot  No Known Allergies  Patient Measurements: Height: 5\' 2"  (157.5 cm) Weight: 98 lb 3.2 oz (44.543 kg) IBW/kg (Calculated) : 50.1  Vital Signs: Temp: 97.8 F (36.6 C) (11/02 0616) Temp Source: Oral (11/02 0616) BP: 157/80 mmHg (11/02 0616) Pulse Rate: 106 (11/02 0616)  Labs:  Recent Labs  10/17/15 0945 10/17/15 1418 10/18/15 0508 10/19/15 0630  HGB 10.5*  --  7.4* 11.0*  HCT 32.1*  --  22.6* 34.1*  PLT 516*  --  411* 641*  APTT  --  56*  --   --   LABPROT  --  45.9* 41.9* 55.3*  INR  --  5.14* 4.56* 6.59*  CREATININE 0.63  --  0.54 0.56  TROPONINI <0.03  --   --   --    Estimated Creatinine Clearance: 43.3 mL/min (by C-G formula based on Cr of 0.56).  Medical History: Past Medical History  Diagnosis Date  . Essential hypertension, benign   . Type 2 diabetes mellitus ([place])   . Osteoporosis 06/2008  . 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 History of parotid cancer    Medications:  Prescriptions prior to admission  Medication Sig Dispense Refill Last Dose  . cephALEXin (KEFLEX) 500 MG capsule Take 1 capsule (500 mg total) by mouth 4 (four) times daily. 28 capsule 0 10/17/2015 at Unknown time  . collagenase (SANTYL) ointment Apply 1 application topically daily.   10/16/2015 at Unknown time  . enoxaparin (LOVENOX) 40 MG/0.4ML injection Inject 0.4 mLs (40 mg total) into the skin daily. 14 Syringe 0 10/16/2015 at Unknown time  . FLUoxetine (PROZAC) 10 MG capsule TAKE 1 CAPSULE BY MOUTH DAILY FOR DEPRESSION/ANXIETY 90 capsule 1 10/16/2015 at Unknown time  . ketoconazole (NIZORAL) 2 % cream Apply 1 application topically 2 (two) times daily.   10/16/2015 at Unknown time  . levETIRAcetam (KEPPRA) 750 MG tablet TAKE 1 TABLET BY MOUTH EVERY MORNING AND 2 TABLETS BY MOUTH EVERY NIGHT AT BEDTIME 270 tablet 3 10/16/2015 at Unknown time  . lisinopril (PRINIVIL,ZESTRIL) 2.5 MG tablet Take 1  tablet (2.5 mg total) by mouth daily. 30 tablet 5 10/16/2015 at Unknown time  . metFORMIN (GLUCOPHAGE) 500 MG tablet Take one half tablet BID (Patient taking differently: Take 250 mg by mouth 2 (two) times daily with a meal. ) 30 tablet 5 10/16/2015 at Unknown time  . methylphenidate (RITALIN) 5 MG tablet Take one tablet per tube twice daily with breakfast and lunch 60 tablet 0 10/16/2015 at Unknown time  . metoprolol tartrate (LOPRESSOR) 25 MG tablet Take 25 mg by mouth 2 (two) times daily.   10/16/2015 at 0900  . NIFEdipine (PROCARDIA XL/ADALAT-CC) 60 MG 24 hr tablet Take 1 tablet (60 mg total) by mouth daily. DO NOT CRUSH 30 tablet 5 10/16/2015 at Unknown time  . ondansetron (ZOFRAN ODT) 4 MG disintegrating tablet Take 1 tablet (4 mg total) by mouth every 8 (eight) hours as needed for nausea or vomiting. 20 tablet 0 10/16/2015 at Unknown time  . pravastatin (PRAVACHOL) 40 MG tablet Take 1 tablet (40 mg total) by mouth daily. 30 tablet 12 10/16/2015 at Unknown time  . senna-docusate (SENOKOT-S) 8.6-50 MG per tablet Take 2 tablets by mouth 2 (two) times daily. For constipation 100 tablet 1 10/16/2015 at Unknown time  . sodium chloride 1 G tablet Take 1 g by mouth 2 (two) times daily with a meal.   10/16/2015 at  Unknown time  . warfarin (COUMADIN) 2 MG tablet Take 1/2 a pill on Friday evening and take a whole pill on all other days. (Patient taking differently: Take 1-2 mg by mouth See admin instructions. Take 1 mg on Tues, Thurs, and Sat.  Take 2 mg on Sun, Mon, Wed, and Fri.) 30 tablet 5 10/16/2015 at Unknown time  . metoprolol (LOPRESSOR) 50 MG tablet Take 1 tablet (50 mg total) by mouth 2 (two) times daily. (Patient not taking: Reported on 10/08/2015) 60 tablet 5 Not Taking at Unknown time  . raloxifene (EVISTA) 60 MG tablet Take 1 tablet (60 mg total) by mouth daily. 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  Goal of Therapy:  INR 2-3 Monitor platelets by anticoagulation protocol: Yes   Plan: Hold Coumadin PT/INR daily Restart coumadin when Johnston Ebbs, PharmD  10/19/2015,11:04 AM

## 2015-10-19 NOTE — Progress Notes (Signed)
Initial Nutrition Assessment  DOCUMENTATION CODES:   Non-severe (moderate) malnutrition in context of chronic illness   Underweight  INTERVENTION:  Start Vital 1.2 @ 20 ml/hr and closely monitor tolerance: specifically abdominal pain, distention, vomiting.  NS @ 75 ml/hr currently.   If no IVF add free water -200 ml QID and 30 ml water before and after medications   NUTRITION DIAGNOSIS:   Predicted suboptimal nutrient intake related to altered GI function as evidenced by NPO status.  GOAL:   Provide needs based on ASPEN/SCCM guidelines   MONITOR:   TF tolerance, Labs, Weight trends, Skin  REASON FOR ASSESSMENT:   Consult Enteral/tube feeding initiation and management  ASSESSMENT: Pt  tube feeding has been held since last night following vomiting,  3 episodes of diarrhea and abdominal pain per nursing note. C. Diff culture negative so far. Husband is concerned this morning that pt has not been able to get much nutrition in since last Friday due to her poor tolerance. Will change formula to semi-elemental which hopefully she will be able to tolerate better and MD has added Reglan.  Labs: glucose 169, potassium 3.3  Diet Order:  Diet NPO time specified  Skin:   surgical wound to neck with minimal drainage per WOC  Last BM:   11/1 diarrhea per nursing  Height:   Ht Readings from Last 1 Encounters:  10/17/15 5\' 2"  (1.575 m)    Weight:   Wt Readings from Last 1 Encounters:  10/19/15 98 lb 3.2 oz (44.543 kg)    Ideal Body Weight:  50 kg  BMI:  Body mass index is 17.96 kg/(m^2).  Estimated Nutritional Needs:   Kcal:  1260  Protein:  63-70 gr  Fluid:  1.3 liters daily  EDUCATION NEEDS:   No education needs identified at this time   Colman Cater MS,RD,CSG,LDN Office: #[phone] Pager: [phone]

## 2015-10-19 NOTE — Progress Notes (Signed)
[facility] PROGRESS NOTE  Laura Melendez GEX:[id number] DOB: 07-Sep-1941 DOA: 10/17/2015 PCP: [name], MD  Assessment/Plan: 1. Sepsis secondary to aspiration pneumonia, presumed related to tube feeds.  Blood cultures pending. WBC 17.6 today, afebrile. Will continue IVF and IV abx. 2. Acute hypoxic respiratory failure. Resolved with supplemental O2. Now stable on RA.  3. Diarrhea and hematochezia, 3 episodes the night of 11/1. Stool cultures pending. C. Diff negative.  Hgb 11. Possibly related to tube feeds, will give a trial of Immodium.  4. Non-anion gap metabolic acidosis, possibly related to diarrhea. Will change IVF to contain bicarbonate.  5. Vomiting, possibly related to tube feeding. Will check serial gastric residuals. Continue antiemetics and start Reglan.  6. Hyponatremia, resolved with IVF.  7. Hyperkalemia, resolved with IVF. Now mildly hypokalemic. Will give low dose of Potassium and recheck. 8. Anemia of chronic disease, stable.  9. Pyuria. Urine culture shows insignificant growth. 10. Parotid gland cancer s/p left parotidectomy at [facility] with complicated postoperative course including aspiration pneumonia, pneumothorax and intubation per Dr. Janalyn Rouse note on 10/31. Patient ultimately required PEG tube on 10/03/15.  11. DM type 2, now stable with one episode of hypoglycemia since admission. Continue SSI.  12. Seizure disorder on Keppra. 13. PMH stroke 03/2015, on warfarin. 14. Elevated INR. 6.59 today. Warfarin on hold. No evidence of significant bleeding and Hgb is stable. Will continue to monitor.  15. Discussion: Discussed at length with patient's husband regarding her current state of health and prognosis. He is hopeful that her swallowing will recover and that she will eventually be able to tolerate a solid diet. It was explained to him that with prolonged tube feeding there was a significant risk for recurrent aspiration. At this point, her long term prognosis  appears to be poor but he is still hopeful for a recovery.   Code Status: Full DVT prophylaxis: SCDs Family Communication: Husband at bedside. Discussed with patient who understands and has no concerns at this time. Disposition Plan: Discharge on improvement.    Consultants:    Procedures:    Antibiotics:  Zosyn 10/31>>10/31  Vancomycin 10/31>>  HPI/Subjective: Feels bad. Breathing is okay but has nausea and abdominal pain. Husband denies any coughing or vomiting.  Objective: Filed Vitals:   10/19/15 0616  BP: 157/80  Pulse: 106  Temp: 97.8 F (36.6 C)  Resp: 20    Intake/Output Summary (Last 24 hours) at 10/19/15 0759 Last data filed at 10/19/15 0700  Gross per 24 hour  Intake 2011.25 ml  Output    704 ml  Net 1307.25 ml   Filed Weights   10/17/15 1353 10/18/15 0500 10/19/15 0616  Weight: 42.3 kg (93 lb 4.1 oz) 42.1 kg (92 lb 13 oz) 44.543 kg (98 lb 3.2 oz)    Exam:  General: NAD, looks comfortable Cardiovascular: RRR, S1, S2  Respiratory: clear bilaterally, No wheezing, rales or rhonchi Abdomen: soft, non tender, no distention , bowel sounds normal Musculoskeletal: No edema b/l  Data Reviewed: Basic Metabolic Panel:  Recent Labs Lab 10/14/15 0735 10/17/15 0945 10/18/15 0508 10/19/15 0630  NA 128* 128* 137 136  K 4.6 5.2* 3.7 3.3*  CL 93* 93* 107 109  CO2 26 26 25  17*  GLUCOSE 178* 255* 75 169*  BUN 34* 27* 17 16  CREATININE 0.56 0.63 0.54 0.56  CALCIUM 8.1* 7.7* 7.3* 7.0*   Liver Function Tests:  Recent Labs Lab 10/14/15 0735 10/17/15 0945  AST 22 31  ALT 23 28  ALKPHOS 55  51  BILITOT 0.5 0.4  PROT 5.5* 5.1*  ALBUMIN 2.2* 2.0*    Recent Labs Lab 10/17/15 0945  LIPASE 33   CBC:  Recent Labs Lab 10/14/15 0735 10/17/15 0945 10/18/15 0508 10/19/15 0630  WBC 11.6* 22.0* 12.9* 17.6*  NEUTROABS 9.2* 20.8*  --   --   HGB 11.3* 10.5* 7.4* 11.0*  HCT 34.4* 32.1* 22.6* 34.1*  MCV 80.2 80.5 81.3 81.0  PLT 406* 516* 411*  641*   Cardiac Enzymes:  Recent Labs Lab 10/17/15 0945  TROPONINI <0.03   BNP (last 3 results)  Recent Labs  10/17/15 0945  BNP 118.0*    CBG:  Recent Labs Lab 10/18/15 1615 10/18/15 1943 10/19/15 0013 10/19/15 0436 10/19/15 0737  GLUCAP 81 129* 144* 162* 156*    Recent Results (from the past 240 hour(s))  Wound culture     Status: None   Collection Time: 10/09/15  1:40 PM  Result Value Ref Range Status   Specimen Description NECK  Final   Special Requests Immunocompromised  Final   Gram Stain   Final    NO WBC SEEN NO SQUAMOUS EPITHELIAL CELLS SEEN NO ORGANISMS SEEN Performed at [facility]    Culture   Final    ABUNDANT DIPHTHEROIDS(CORYNEBACTERIUM SPECIES) Note: Standardized susceptibility testing for this organism is not available. Performed at [facility]    Report Status 10/11/2015 FINAL  Final  Culture, blood (routine x 2)     Status: None (Preliminary result)   Collection Time: 10/17/15  9:47 AM  Result Value Ref Range Status   Specimen Description BLOOD RIGHT ANTECUBITAL DRAWN BY RN  Final   Special Requests BOTTLES DRAWN AEROBIC AND ANAEROBIC [place]  Final   Culture NO GROWTH 1 DAY  Final   Report Status PENDING  Incomplete  Culture, blood (routine x 2)     Status: None (Preliminary result)   Collection Time: 10/17/15 10:00 AM  Result Value Ref Range Status   Specimen Description BLOOD LEFT ANTECUBITAL  Final   Special Requests BOTTLES DRAWN AEROBIC AND ANAEROBIC [place]  Final   Culture NO GROWTH 1 DAY  Final   Report Status PENDING  Incomplete  Urine culture     Status: None   Collection Time: 10/17/15 10:05 AM  Result Value Ref Range Status   Specimen Description URINE, CATHETERIZED  Final   Special Requests NONE  Final   Culture   Final    >=100,000 COLONIES/mL YEAST Performed at [facility]    Report Status 10/18/2015 FINAL  Final  MRSA PCR Screening     Status: None   Collection Time: 10/17/15  2:00  PM  Result Value Ref Range Status   MRSA by PCR NEGATIVE NEGATIVE Final    Comment:        The GeneXpert MRSA Assay (FDA approved for NASAL specimens only), is one component of a comprehensive MRSA colonization surveillance program. It is not intended to diagnose MRSA infection nor to guide or monitor treatment for MRSA infections.   C difficile quick scan w PCR reflex     Status: None   Collection Time: 10/19/15 12:00 AM  Result Value Ref Range Status   C Diff antigen NEGATIVE NEGATIVE Final   C Diff toxin NEGATIVE NEGATIVE Final   C Diff interpretation Negative for toxigenic C. difficile  Final     Studies: Dg Abd Acute W/chest  10/17/2015  CLINICAL DATA:  Nausea and vomiting, possible aspiration. EXAM: DG ABDOMEN  ACUTE W/ 1V CHEST COMPARISON:  Chest x-ray of October 13, 2015 FINDINGS: Chest x-ray: The lungs are adequately inflated. There are increased densities in the lower lobes bilaterally. These are more conspicuous than in the past. The heart is top-normal in size but stable. The pulmonary vascularity is normal. The mediastinum is normal in width. Within the abdomen there is a moderate amount of contrast noted throughout the normal caliber colon. A gastrostomy tube is in place. The stomach and small bowel do not appear abnormally distended. There is gentle levocurvature of the mid lumbar spine. IMPRESSION: 1. Increased density at both lung bases worrisome for the clinically suspected aspiration and development of atelectasis or pneumonia. Stable enlargement cardiac silhouette without pulmonary edema. 2. The bowel gas pattern is within the limits of normal with exception of the presence of a moderate amount of contrast in the normal calibered colon. This contrast may be residual from previous studies. I do not have history of recent oral contrast administration. Electronically Signed   By: David  [place] M.D.   On: 10/17/2015 09:50    Scheduled Meds: . antiseptic oral rinse  7 mL  Mouth Rinse q12n4p  . chlorhexidine  15 mL Mouth Rinse BID  . feeding supplement (OSMOLITE 1.5 CAL)  1,000 mL Per Tube Q24H  . feeding supplement (PRO-STAT SUGAR FREE 64)  30 mL Per Tube BID  . geriatric multivitamins-minerals  15 mL Oral Daily  . insulin aspart  0-9 Units Subcutaneous 6 times per day  . levETIRacetam  1,500 mg Intravenous Q12H  . piperacillin-tazobactam (ZOSYN)  IV  3.375 g Intravenous Q8H  . sodium chloride  3 mL Intravenous Q12H  . vancomycin  750 mg Intravenous Q24H  . Warfarin - Pharmacist Dosing Inpatient   Does not apply q1800   Continuous Infusions: . sodium chloride 75 mL/hr at 10/18/15 1707    Principal Problem:   Aspiration pneumonia ([place]) Active Problems:   DM (diabetes mellitus) ([place])   Right hemiparesis ([place])   Sepsis ([place])   Acute respiratory failure with hypoxia ([place])    Time spent: 25 minutes    Joyous Gleghorn. MD  [facility] Pager [phone]. If 7PM-7AM, please contact night-coverage at [email or web address], password [facility] 10/19/2015, 7:59 AM  LOS: 2 days      By signing my name below, I, Rosalie Doctor, attest that this documentation has been prepared under the direction and in the presence of [facility] MD Electronically Signed: Rosalie Doctor, Scribe. 10/19/2015 10:21am   I, Dr. [name], personally performed the services described in this documentaiton. All medical record entries made by the scribe were at my direction and in my presence. I have reviewed the chart and agree that the record reflects my personal performance and is accurate and complete  [name], MD, 10/19/2015 10:50 AM

## 2015-10-19 NOTE — Progress Notes (Signed)
PT Cancellation Note  Patient Details Name: Laura Melendez MRN: [id number] DOB: 08-28-1941   Cancelled Treatment:    Reason Eval/Treat Not Completed: Medical issues which prohibited therapy. Chart reviewed, RN consulted. Pt with recent INR >6.0, outside of safe range for PT evaluation/treatment. Will try again at later date/time as patient is medically ready.    Roshan Salamon C 10/19/2015, 10:33 AM  10:34 AM  Etta Grandchild, PT, DPT [place] License # [id number]

## 2015-10-19 NOTE — Progress Notes (Signed)
Pt with 3 episodes of diarrhea with small amount of blood noted. Dr. Loralee Pacas notified. Stool for cdiff and culture ordered. Pt placed on enteric precautions. Pt's PEG feeding was placed on hold in ICU d/t pt vomiting and complaints of abd discomfort when tube feeding was running. Dr. Shanon Brow also made aware of this. Tube feeding to be held overnight for primary MD to assess in AM.

## 2015-10-20 DIAGNOSIS — E440 Moderate protein-calorie malnutrition: Secondary | ICD-10-CM

## 2015-10-20 DIAGNOSIS — E118 Type 2 diabetes mellitus with unspecified complications: Secondary | ICD-10-CM

## 2015-10-20 LAB — CBC
HCT: 28.5 % — ABNORMAL LOW (ref 36.0–46.0)
HEMOGLOBIN: 9.2 g/dL — AB (ref 12.0–15.0)
MCH: 26 pg (ref 26.0–34.0)
MCHC: 32.3 g/dL (ref 30.0–36.0)
MCV: 80.5 fL (ref 78.0–100.0)
PLATELETS: 604 10*3/uL — AB (ref 150–400)
RBC: 3.54 MIL/uL — AB (ref 3.87–5.11)
RDW: 15 % (ref 11.5–15.5)
WBC: 17.2 10*3/uL — AB (ref 4.0–10.5)

## 2015-10-20 LAB — GLUCOSE, CAPILLARY
GLUCOSE-CAPILLARY: 118 mg/dL — AB (ref 65–99)
GLUCOSE-CAPILLARY: 119 mg/dL — AB (ref 65–99)
GLUCOSE-CAPILLARY: 140 mg/dL — AB (ref 65–99)
Glucose-Capillary: 123 mg/dL — ABNORMAL HIGH (ref 65–99)
Glucose-Capillary: 134 mg/dL — ABNORMAL HIGH (ref 65–99)
Glucose-Capillary: 135 mg/dL — ABNORMAL HIGH (ref 65–99)
Glucose-Capillary: 168 mg/dL — ABNORMAL HIGH (ref 65–99)
Glucose-Capillary: 170 mg/dL — ABNORMAL HIGH (ref 65–99)

## 2015-10-20 LAB — BASIC METABOLIC PANEL
Anion gap: 6 (ref 5–15)
BUN: 13 mg/dL (ref 6–20)
CALCIUM: 7.2 mg/dL — AB (ref 8.9–10.3)
CHLORIDE: 109 mmol/L (ref 101–111)
CO2: 24 mmol/L (ref 22–32)
CREATININE: 0.42 mg/dL — AB (ref 0.44–1.00)
GFR calc Af Amer: >60 mL/min (ref >60)
GFR calc non Af Amer: >60 mL/min (ref >60)
Glucose, Bld: 128 mg/dL — ABNORMAL HIGH (ref 65–99)
Potassium: 3.5 mmol/L (ref 3.5–5.1)
SODIUM: 139 mmol/L (ref 135–145)

## 2015-10-20 LAB — VANCOMYCIN, TROUGH: VANCOMYCIN TR: 8 ug/mL — AB (ref 10.0–20.0)

## 2015-10-20 LAB — PROTIME-INR
INR: 8.99 (ref 0.00–1.49)
PROTHROMBIN TIME: 69.9 s — AB (ref 11.6–15.2)

## 2015-10-20 MED ORDER — AMOXICILLIN-POT CLAVULANATE 400-57 MG/5ML PO SUSR
875.0000 mg | Freq: Two times a day (BID) | ORAL | Status: DC
  Filled 2015-10-20 (×3): qty 10.9

## 2015-10-20 MED ORDER — VITAMIN K1 10 MG/ML IJ SOLN
5.0000 mg | Freq: Once | INTRAVENOUS | Status: AC
  Administered 2015-10-20: 5 mg via INTRAVENOUS
  Filled 2015-10-20: qty 0.5

## 2015-10-20 MED ORDER — VANCOMYCIN HCL IN DEXTROSE 750-5 MG/150ML-% IV SOLN
750.0000 mg | Freq: Once | INTRAVENOUS | Status: DC
  Administered 2015-10-20: 750 mg via INTRAVENOUS
  Filled 2015-10-20: qty 150

## 2015-10-20 MED ORDER — VITAL AF 1.2 CAL PO LIQD
1000.0000 mL | ORAL | Status: DC
  Filled 2015-10-20 (×4): qty 1000

## 2015-10-20 MED ORDER — FREE WATER
200.0000 mL | Freq: Three times a day (TID) | Status: DC
  Administered 2015-10-20 – 2015-10-21 (×3): 200 mL

## 2015-10-20 MED ORDER — AMOXICILLIN-POT CLAVULANATE 200-28.5 MG/5ML PO SUSR
875.0000 mg | Freq: Two times a day (BID) | ORAL | Status: DC
  Administered 2015-10-20 – 2015-10-21 (×2): 875 mg
  Filled 2015-10-20 (×8): qty 50

## 2015-10-20 MED ORDER — VANCOMYCIN HCL 500 MG IV SOLR
500.0000 mg | Freq: Two times a day (BID) | INTRAVENOUS | Status: DC
  Filled 2015-10-20 (×2): qty 500

## 2015-10-20 MED ORDER — AMOXICILLIN-POT CLAVULANATE 400-57 MG/5ML PO SUSR: 875.0000 mg | Freq: Two times a day (BID) | ORAL | Status: DC

## 2015-10-20 NOTE — Progress Notes (Signed)
Notified Dr.Memon of critical labs. PT 69.9 and INR 8.99. No new orders at this time.

## 2015-10-20 NOTE — Progress Notes (Signed)
ANTIBIOTIC CONSULT NOTE - follow up  Pharmacy Consult for Vancomycin and Zosyn Indication: pneumonia  No Known Allergies  Patient Measurements: Height: 5\' 2"  (157.5 cm) Weight: 99 lb 10.4 oz (45.2 kg) IBW/kg (Calculated) : 50.1  Vital Signs: Temp: 98.2 F (36.8 C) (11/03 0438) Temp Source: Oral (11/03 0438) BP: 150/79 mmHg (11/03 0438) Pulse Rate: 101 (11/03 0438) Intake/Output from previous day: 11/02 0701 - 11/03 0700 In: 1563 [I.V.:1173; IV Piggyback:150] Out: 956 [Urine:950; Stool:6] Intake/Output from this shift: Total I/O In: 30 [Other:30] Out: -   Labs:  Recent Labs  10/18/15 0508 10/19/15 0630 10/20/15 0557  WBC 12.9* 17.6* 17.2*  HGB 7.4* 11.0* 9.2*  PLT 411* 641* 604*  CREATININE 0.54 0.56 0.42*   Estimated Creatinine Clearance: 44 mL/min (by C-G formula based on Cr of 0.42).  Recent Labs  10/20/15 1007  [place] 8*    Microbiology: Recent Results (from the past 720 hour(s))  Urine culture     Status: None   Collection Time: 10/08/15 10:41 AM  Result Value Ref Range Status   Specimen Description URINE, CATHETERIZED  Final   Special Requests NONE  Final   Culture   Final    >=100,000 COLONIES/mL YEAST Performed at [facility]    Report Status 10/18/2015 FINAL  Final  C difficile quick scan w PCR reflex     Status: None   Collection Time: 10/08/15 12:06 PM  Result Value Ref Range Status   C Diff antigen NEGATIVE NEGATIVE Final   C Diff toxin NEGATIVE NEGATIVE Final   C Diff interpretation Negative for toxigenic C. difficile  Final  Wound culture     Status: None   Collection Time: 10/09/15  1:40 PM  Result Value Ref Range Status   Specimen Description NECK  Final   Special Requests Immunocompromised  Final   Gram Stain   Final    NO WBC SEEN NO SQUAMOUS EPITHELIAL CELLS SEEN NO ORGANISMS SEEN Performed at [facility]    Culture   Final    ABUNDANT DIPHTHEROIDS(CORYNEBACTERIUM SPECIES) Note: Standardized  susceptibility testing for this organism is not available. Performed at [facility]    Report Status 10/11/2015 FINAL  Final  Culture, blood (routine x 2)     Status: None (Preliminary result)   Collection Time: 10/17/15  9:47 AM  Result Value Ref Range Status   Specimen Description BLOOD RIGHT ANTECUBITAL DRAWN BY RN  Final   Special Requests BOTTLES DRAWN AEROBIC AND ANAEROBIC [place]  Final   Culture NO GROWTH 3 DAYS  Final   Report Status PENDING  Incomplete  Culture, blood (routine x 2)     Status: None (Preliminary result)   Collection Time: 10/17/15 10:00 AM  Result Value Ref Range Status   Specimen Description BLOOD LEFT ANTECUBITAL  Final   Special Requests BOTTLES DRAWN AEROBIC AND ANAEROBIC [place]  Final   Culture NO GROWTH 3 DAYS  Final   Report Status PENDING  Incomplete  Urine culture     Status: None   Collection Time: 10/17/15 10:05 AM  Result Value Ref Range Status   Specimen Description URINE, CATHETERIZED  Final   Special Requests NONE  Final   Culture   Final    >=100,000 COLONIES/mL YEAST Performed at [facility]    Report Status 10/18/2015 FINAL  Final  MRSA PCR Screening     Status: None   Collection Time: 10/17/15  2:00 PM  Result Value Ref Range Status  MRSA by PCR NEGATIVE NEGATIVE Final    Comment:        The GeneXpert MRSA Assay (FDA approved for NASAL specimens only), is one component of a comprehensive MRSA colonization surveillance program. It is not intended to diagnose MRSA infection nor to guide or monitor treatment for MRSA infections.   Stool culture     Status: None (Preliminary result)   Collection Time: 10/19/15 12:00 AM  Result Value Ref Range Status   Specimen Description STOOL  Final   Special Requests NONE  Final   Culture   Final    Culture reincubated for better growth Performed at [facility]    Report Status PENDING  Incomplete  C difficile quick scan w PCR reflex     Status: None    Collection Time: 10/19/15 12:00 AM  Result Value Ref Range Status   C Diff antigen NEGATIVE NEGATIVE Final   C Diff toxin NEGATIVE NEGATIVE Final   C Diff interpretation Negative for toxigenic C. difficile  Final   Medical History: Past Medical History  Diagnosis Date  . Essential hypertension, benign   . Type 2 diabetes mellitus ([place])   . Osteoporosis 06/2008  . Stroke [facility]) 03/2015    right hemiparesis  . History of parotid cancer    Anti-infectives    Start     Dose/Rate Route Frequency Ordered Stop   10/18/15 1100  vancomycin (VANCOCIN) IVPB 750 mg/150 ml premix     750 mg 150 mL/hr over 60 Minutes Intravenous Every 24 hours 10/17/15 1035     10/17/15 1800  piperacillin-tazobactam (ZOSYN) IVPB 3.375 g     3.375 g 12.5 mL/hr over 240 Minutes Intravenous Every 8 hours 10/17/15 1035     10/17/15 1400  piperacillin-tazobactam (ZOSYN) IVPB 3.375 g  Status:  Discontinued     3.375 g 100 mL/hr over 30 Minutes Intravenous 3 times per day 10/17/15 1012 10/17/15 1028   10/17/15 1030  vancomycin (VANCOCIN) IVPB 750 mg/150 ml premix     750 mg 150 mL/hr over 60 Minutes Intravenous  Once 10/17/15 1020 10/17/15 1143   10/17/15 1030  piperacillin-tazobactam (ZOSYN) IVPB 3.375 g     3.375 g 100 mL/hr over 30 Minutes Intravenous  Once 10/17/15 1028 10/17/15 1103   10/17/15 1015  vancomycin (VANCOCIN) IVPB 1000 mg/200 mL premix  Status:  Discontinued     1,000 mg 200 mL/hr over 60 Minutes Intravenous  Once 10/17/15 1012 10/17/15 1020      Assessment: 74 yo female from [place] center presented with vomiting and hypoxemia Increased density at both lung bases worrisome for the clinically suspected aspiration and development of atelectasis or pneumonia. Stable enlargement cardiac silhouette without pulmonary edema. Pt recently as [facility]  For left parotidectomy that was complicated by hematoma and an aspiration pneumonia. Trough level is below goal, renal fxn is stable.    Pharmacokinetic dosing service   Vancomycin single level analysis: Current dose being given: 750 mg Current dosing interval:  24 hrs Current infusion time (hrs): 1  Single level Trough Data:  Trough level obtained: 8 mcg/ml Timing of trough - Number of hours since last dose:  23 Hrs Desired peak:  35 mcg/ml  Desired trough: 15 mcg/ml  Estimated PK Parameters: --------------------------- New rate constant (kel): 0.060 hr-1 New half-life: 11.55 Hours New Vd from levels: 31.50  Liters  (0.7 L/kg)  Recommendations: ==================== Give Vancomycin  500 mg  q 12 hrs. Infuse over 1 hrs Expected Cpeak: 30.0  mcg/ml    Expected Ctrough: 15.5 mcg/ml  Recommended labs and intervals: Measure Bun and Scr 3 times/week.   Thank you for the consult, will continue to follow.  Goal of Therapy:  Vancomycin trough level 15-20 mcg/ml  Plan:  Continue Zosyn 3.375gm iv q8h extended dosing interval Change Vancomycin to 500mg  iv q12h  Recheck trough level weekly or sooner if warranted  Monitor V/S and labs  Hart Robinsons, PharmD Clinical Pharmacist 10/20/2015,11:18 AM

## 2015-10-20 NOTE — Progress Notes (Signed)
[facility] PROGRESS NOTE  Laura Melendez WPY:[id number] DOB: September 21, 1941 DOA: 10/17/2015 PCP: [name], MD  Assessment/Plan: 1. Sepsis secondary to aspiration pneumonia, presumed related to tube feeds. Blood cultures pending, with no growth in two days. Moderate leukocytosis remains stable, yet she remains afebrile. Will transition to oral abx today. 2. Acute hypoxic respiratory failure. Resolved with supplemental O2. Now stable on RA.  3. Diarrhea and hematochezia appears to have improved with imodium. Stool cultures are still pending. Stool for C. Diff negative. I suspect this is related to tube feeds. Continue current treatments.  4. Non-anion gap metabolic acidosis, possibly related to diarrhea. Improved with bicarbonate infusion.  5. Vomiting, possibly related to tube feeding. Resolved. Continue antiemetics and  Reglan.  6. Hyponatremia, resolved with IVF.  7. Hyperkalemia, resolved with IVF and low dose potassium. 8. Anemia of chronic disease, stable. Hgb trended down to 9.2. No evidence of bleeding.  9. Pyuria. Urine culture shows insignificant growth. 10. Parotid gland cancer s/p left parotidectomy at [facility] with complicated postoperative course including aspiration pneumonia, pneumothorax and intubation per Dr. Janalyn Rouse note on 10/31. Patient ultimately required PEG tube on 10/03/15.  11. DM type 2, now stable with one episode of hypoglycemia since admission. Continue SSI.  12. Seizure disorder on Keppra. 13. PMH stroke 03/2015, on warfarin. 14. Elevated INR. INR has trended up to 8.99, 11/3. Warfarin on hold. No evidence of significant bleeding and Hgb is stable. Patient will receive a small dose of vitamin K. 15. Non-severe malnutrition, in the context of chronic illness. Followed by nutrition.  16. Discussion: Discussed at length with patient's husband regarding her current state of health and prognosis. He is hopeful that her swallowing will recover and that she will  eventually be able to tolerate a solid diet. It was explained to him that with prolonged tube feeding there was a significant risk for recurrent aspiration. At this point, her long term prognosis appears to be poor but he is still hopeful for a recovery.   Code Status: Full DVT prophylaxis: SCDs Family Communication: Husband at bedside. Discussed with patient who understands and has no concerns at this time. Disposition Plan: Discharge to SNF within 24 hours.    Consultants:    Procedures:    Antibiotics:  Zosyn 10/31>>11/02  Vancomycin 10/31>>11/02  Augmentin 11/02>>  HPI/Subjective: Doing good. Denies and abdominal pain, nausea, vomiting or excessive bowel movements.   Objective: Filed Vitals:   10/20/15 0438  BP: 150/79  Pulse: 101  Temp: 98.2 F (36.8 C)  Resp: 20    Intake/Output Summary (Last 24 hours) at 10/20/15 0706 Last data filed at 10/20/15 0646  Gross per 24 hour  Intake   1563 ml  Output    956 ml  Net    607 ml   Filed Weights   10/18/15 0500 10/19/15 0616 10/20/15 0438  Weight: 42.1 kg (92 lb 13 oz) 44.543 kg (98 lb 3.2 oz) 45.2 kg (99 lb 10.4 oz)    Exam:  General: NAD. Lying in bed and  looks comfortable. Cardiovascular: Regular rhythm and regular rate. No murmur  Respiratory: clear bilaterally, No wheezing, rales or rhonchi Abdomen: soft, non tender, no distention , bowel sounds normal Musculoskeletal: No edema b/l  Data Reviewed: Basic Metabolic Panel:  Recent Labs Lab 10/14/15 0735 10/17/15 0945 10/18/15 0508 10/19/15 0630 10/20/15 0557  NA 128* 128* 137 136 139  K 4.6 5.2* 3.7 3.3* 3.5  CL 93* 93* 107 109 109  CO2 26 26 25  17* 24  GLUCOSE 178* 255* 75 169* 128*  BUN 34* 27* 17 16 13   CREATININE 0.56 0.63 0.54 0.56 0.42*  CALCIUM 8.1* 7.7* 7.3* 7.0* 7.2*   Liver Function Tests:  Recent Labs Lab 10/14/15 0735 10/17/15 0945  AST 22 31  ALT 23 28  ALKPHOS 55 51  BILITOT 0.5 0.4  PROT 5.5* 5.1*  ALBUMIN 2.2* 2.0*     Recent Labs Lab 10/17/15 0945  LIPASE 33   CBC:  Recent Labs Lab 10/14/15 0735 10/17/15 0945 10/18/15 0508 10/19/15 0630 10/20/15 0557  WBC 11.6* 22.0* 12.9* 17.6* 17.2*  NEUTROABS 9.2* 20.8*  --   --   --   HGB 11.3* 10.5* 7.4* 11.0* 9.2*  HCT 34.4* 32.1* 22.6* 34.1* 28.5*  MCV 80.2 80.5 81.3 81.0 80.5  PLT 406* 516* 411* 641* 604*   Cardiac Enzymes:  Recent Labs Lab 10/17/15 0945  TROPONINI <0.03   BNP (last 3 results)  Recent Labs  10/17/15 0945  BNP 118.0*    CBG:  Recent Labs Lab 10/19/15 1147 10/19/15 1619 10/19/15 2030 10/20/15 0031 10/20/15 0437  GLUCAP 146* 225* 166* 134* 119*    Recent Results (from the past 240 hour(s))  Culture, blood (routine x 2)     Status: None (Preliminary result)   Collection Time: 10/17/15  9:47 AM  Result Value Ref Range Status   Specimen Description BLOOD RIGHT ANTECUBITAL DRAWN BY RN  Final   Special Requests BOTTLES DRAWN AEROBIC AND ANAEROBIC [place]  Final   Culture NO GROWTH 2 DAYS  Final   Report Status PENDING  Incomplete  Culture, blood (routine x 2)     Status: None (Preliminary result)   Collection Time: 10/17/15 10:00 AM  Result Value Ref Range Status   Specimen Description BLOOD LEFT ANTECUBITAL  Final   Special Requests BOTTLES DRAWN AEROBIC AND ANAEROBIC [place]  Final   Culture NO GROWTH 2 DAYS  Final   Report Status PENDING  Incomplete  Urine culture     Status: None   Collection Time: 10/17/15 10:05 AM  Result Value Ref Range Status   Specimen Description URINE, CATHETERIZED  Final   Special Requests NONE  Final   Culture   Final    >=100,000 COLONIES/mL YEAST Performed at [facility]    Report Status 10/18/2015 FINAL  Final  MRSA PCR Screening     Status: None   Collection Time: 10/17/15  2:00 PM  Result Value Ref Range Status   MRSA by PCR NEGATIVE NEGATIVE Final    Comment:        The GeneXpert MRSA Assay (FDA approved for NASAL specimens only), is one component of  a comprehensive MRSA colonization surveillance program. It is not intended to diagnose MRSA infection nor to guide or monitor treatment for MRSA infections.   C difficile quick scan w PCR reflex     Status: None   Collection Time: 10/19/15 12:00 AM  Result Value Ref Range Status   C Diff antigen NEGATIVE NEGATIVE Final   C Diff toxin NEGATIVE NEGATIVE Final   C Diff interpretation Negative for toxigenic C. difficile  Final     Studies: No results found.  Scheduled Meds: . antiseptic oral rinse  7 mL Mouth Rinse q12n4p  . chlorhexidine  15 mL Mouth Rinse BID  . feeding supplement (VITAL AF 1.2 CAL)  1,000 mL Per Tube Q24H  . geriatric multivitamins-minerals  15 mL Oral Daily  . insulin aspart  0-9  Units Subcutaneous 6 times per day  . levETIRacetam  1,500 mg Intravenous Q12H  . metoCLOPramide (REGLAN) injection  10 mg Intravenous 4 times per day  . piperacillin-tazobactam (ZOSYN)  IV  3.375 g Intravenous Q8H  . sodium chloride  3 mL Intravenous Q12H  . vancomycin  750 mg Intravenous Q24H  . Warfarin - Pharmacist Dosing Inpatient   Does not apply q1800   Continuous Infusions: . sodium chloride 0.45 % 1,000 mL with potassium chloride 40 mEq, sodium bicarbonate 75 mEq infusion 100 mL/hr at 10/20/15 0045    Principal Problem:   Aspiration pneumonia ([place]) Active Problems:   DM (diabetes mellitus) ([place])   Right hemiparesis (HCC)   Sepsis ([place])   Acute respiratory failure with hypoxia (HCC)   Malnutrition of moderate degree    Time spent: 25 minutes    Jeniyah Menor. MD  [facility] Pager [phone]. If 7PM-7AM, please contact night-coverage at [email or web address], password [facility] 10/20/2015, 7:06 AM  LOS: 3 days      By signing my name below, I, Rennis Harding, attest that this documentation has been prepared under the direction and in the presence of [name], MD. Electronically signed: Rennis Harding, Scribe. 10/20/2015 12:52pm    I, Dr. [name], personally performed the services described in this documentaiton. All medical record entries made by the scribe were at my direction and in my presence. I have reviewed the chart and agree that the record reflects my personal performance and is accurate and complete  [name], MD, 10/20/2015 1:02 PM

## 2015-10-20 NOTE — Progress Notes (Signed)
[facility] for Coumadin Indication: aortic clot  No Known Allergies  Patient Measurements: Height: 5\' 2"  (157.5 cm) Weight: 99 lb 10.4 oz (45.2 kg) IBW/kg (Calculated) : 50.1  Vital Signs: Temp: 98.2 F (36.8 C) (11/03 0438) Temp Source: Oral (11/03 0438) BP: 150/79 mmHg (11/03 0438) Pulse Rate: 101 (11/03 0438)  Labs:  Recent Labs  10/17/15 1418  10/18/15 0508 10/19/15 0630 10/20/15 0557  HGB  --   < > 7.4* 11.0* 9.2*  HCT  --   --  22.6* 34.1* 28.5*  PLT  --   --  411* 641* 604*  APTT 56*  --   --   --   --   LABPROT 45.9*  --  41.9* 55.3* 69.9*  INR 5.14*  --  4.56* 6.59* 8.99*  CREATININE  --   --  0.54 0.56 0.42*  < > = values in this interval not displayed. Estimated Creatinine Clearance: 44 mL/min (by C-G formula based on Cr of 0.42).  Medical History: Past Medical History  Diagnosis Date  . Essential hypertension, benign   . Type 2 diabetes mellitus ([place])   . Osteoporosis 06/2008  . Stroke [facility]) 03/2015    right hemiparesis  . History of parotid cancer    Medications:  Prescriptions prior to admission  Medication Sig Dispense Refill Last Dose  . cephALEXin (KEFLEX) 500 MG capsule Take 1 capsule (500 mg total) by mouth 4 (four) times daily. 28 capsule 0 10/17/2015 at Unknown time  . collagenase (SANTYL) ointment Apply 1 application topically daily.   10/16/2015 at Unknown time  . enoxaparin (LOVENOX) 40 MG/0.4ML injection Inject 0.4 mLs (40 mg total) into the skin daily. 14 Syringe 0 10/16/2015 at Unknown time  . FLUoxetine (PROZAC) 10 MG capsule TAKE 1 CAPSULE BY MOUTH DAILY FOR DEPRESSION/ANXIETY 90 capsule 1 10/16/2015 at Unknown time  . ketoconazole (NIZORAL) 2 % cream Apply 1 application topically 2 (two) times daily.   10/16/2015 at Unknown time  . levETIRAcetam (KEPPRA) 750 MG tablet TAKE 1 TABLET BY MOUTH EVERY MORNING AND 2 TABLETS BY MOUTH EVERY NIGHT AT BEDTIME 270 tablet 3 10/16/2015 at Unknown time  .  lisinopril (PRINIVIL,ZESTRIL) 2.5 MG tablet Take 1 tablet (2.5 mg total) by mouth daily. 30 tablet 5 10/16/2015 at Unknown time  . metFORMIN (GLUCOPHAGE) 500 MG tablet Take one half tablet BID (Patient taking differently: Take 250 mg by mouth 2 (two) times daily with a meal. ) 30 tablet 5 10/16/2015 at Unknown time  . methylphenidate (RITALIN) 5 MG tablet Take one tablet per tube twice daily with breakfast and lunch 60 tablet 0 10/16/2015 at Unknown time  . metoprolol tartrate (LOPRESSOR) 25 MG tablet Take 25 mg by mouth 2 (two) times daily.   10/16/2015 at 0900  . NIFEdipine (PROCARDIA XL/ADALAT-CC) 60 MG 24 hr tablet Take 1 tablet (60 mg total) by mouth daily. DO NOT CRUSH 30 tablet 5 10/16/2015 at Unknown time  . ondansetron (ZOFRAN ODT) 4 MG disintegrating tablet Take 1 tablet (4 mg total) by mouth every 8 (eight) hours as needed for nausea or vomiting. 20 tablet 0 10/16/2015 at Unknown time  . pravastatin (PRAVACHOL) 40 MG tablet Take 1 tablet (40 mg total) by mouth daily. 30 tablet 12 10/16/2015 at Unknown time  . senna-docusate (SENOKOT-S) 8.6-50 MG per tablet Take 2 tablets by mouth 2 (two) times daily. For constipation 100 tablet 1 10/16/2015 at Unknown time  . sodium chloride 1 G tablet Take 1  g by mouth 2 (two) times daily with a meal.   10/16/2015 at Unknown time  . warfarin (COUMADIN) 2 MG tablet Take 1/2 a pill on Friday evening and take a whole pill on all other days. (Patient taking differently: Take 1-2 mg by mouth See admin instructions. Take 1 mg on Tues, Thurs, and Sat.  Take 2 mg on Sun, Mon, Wed, and Fri.) 30 tablet 5 10/16/2015 at Unknown time  . metoprolol (LOPRESSOR) 50 MG tablet Take 1 tablet (50 mg total) by mouth 2 (two) times daily. (Patient not taking: Reported on 10/08/2015) 60 tablet 5 Not Taking at Unknown time  . raloxifene (EVISTA) 60 MG tablet Take 1 tablet (60 mg total) by mouth daily. (Patient not taking: Reported on 10/17/2015) 90 tablet 1 10/07/2015 at Unknown time    Assessment: 74 yo female with aortic clot is currently on coumadin and INR is supratherapeutic. MD has ordered Vitamin K 5mg  IV.  No bleeding reported.  H/H trending down.  Goal of Therapy:  INR 2-3 Monitor platelets by anticoagulation protocol: Yes   Plan: Hold Coumadin PT/INR daily  Hart Robinsons, PharmD  10/20/2015,11:11 AM

## 2015-10-20 NOTE — Evaluation (Signed)
Physical Therapy Evaluation Patient Details Name: Laura Melendez MRN: [id number] DOB: [birth year] Today's Date: 10/20/2015   History of Present Illness  Pt is a 74 year old female with multiple medical problems who was admitted on 10-17-15 from the [facility] for treatment of acute hypoxic respiratory failure, sepsis and aspiration pneumonia.  She has right hemiparesis from an old stroke and was recently discharged from [facility] after excision of the left parotid gland due to CA.  She had numerous complications there.  She now has a PEG tube.  Clinical Impression   Pt was seen for evaluation.  I had to wake her up but she was able to stay awake and participate to a moderate degree.  She is minimally verbal but is able to speak.  She was cooperative with bed exercise.  Her INR is still quite elevated as determined this morning but she has had infusion of Vitamin K.  I discussed beginning PT this afternoon, at a very low level in the bed, and RN felt that this would be safe.  Gentle ROM exercise was initiated to all four extremities.  She does appear to have some function on the right side although she is extremely weak.  She does have significant weakness on the left side as well (2/5) and she fatigues rapidly.  Her head is usually laterally bent to the right but she is able to bring it to neutral with min assist.  Her rehab path will be difficult but she does express a desire to return to SNF.  This is my recommendation.    Follow Up Recommendations SNF    Equipment Recommendations  None recommended by PT    Recommendations for Other Services OT consult     Precautions / Restrictions Precautions Precautions: Fall Precaution Comments: HOB at 75* whenever receiving PEG tube feeding Restrictions Weight Bearing Restrictions: No      Mobility  Bed Mobility               General bed mobility comments: not assessed due to elevated INR  Transfers                 General  transfer comment: not assessed due to elevated INR  Ambulation/Gait             General Gait Details: non ambualtory since parotid gland surgery...it will take some time to develop gait  Stairs                       Balance  unable to test                                           Pertinent Vitals/Pain Pain Assessment: No/denies pain    Home Living Family/patient expects to be discharged to:: Skilled nursing facility                      Prior Function Level of Independence: Independent         Comments: indpendent 6 months ago before sustaining all of her medical problems     Hand Dominance   Dominant Hand: Right    Extremity/Trunk Assessment   Upper Extremity Assessment: RUE deficits/detail;LUE deficits/detail RUE Deficits / Details: pt is able to move fingers enough to make a weak grip, strength at elbow and shoulder is 2-/5  LUE Deficits / Details: strength 2/5 throughout   Lower Extremity Assessment: RLE deficits/detail;LLE deficits/detail RLE Deficits / Details: appears to have some increased tone but is able to isolate muscle groups well enough to assist with AA exercise to a small degree...strength 2-/5 LLE Deficits / Details: strength generally 2/5.Marland Kitchen.fatigues rapidly     Communication   Communication: No difficulties  Cognition Arousal/Alertness: Lethargic Behavior During Therapy: Flat affect Overall Cognitive Status: Within Functional Limits for tasks assessed (minimally verbal but able to follow all directions)                                    Assessment/Plan    PT Assessment Patient needs continued PT services  PT Diagnosis Generalized weakness   PT Problem List Decreased strength;Decreased range of motion;Decreased activity tolerance;Decreased mobility  PT Treatment Interventions Functional mobility training;Therapeutic exercise   PT Goals (Current goals can be found in the Care  Plan section) Acute Rehab PT Goals Patient Stated Goal: none stated PT Goal Formulation: Patient unable to participate in goal setting Time For Goal Achievement: 11/03/15 Potential to Achieve Goals: Fair    Frequency Min 3X/week   Barriers to discharge   complexity of medical problems will not allow for pt to return home at d/c                   End of Session   Activity Tolerance: Patient limited by fatigue Patient left: in bed;with call bell/phone within reach;with family/visitor present Nurse Communication: Mobility status         Time: 8315-1761 PT Time Calculation (min) (ACUTE ONLY): 24 min   Charges:   PT Evaluation $Initial PT Evaluation Tier I: 1 Procedure     PT G CodesDemetrios Melendez L  PT 10/20/2015, 4:40 PM [phone]

## 2015-10-20 NOTE — Progress Notes (Signed)
Passed liquid stool incontinent, Immodium 2mg  given via PEG, dietician to room, increased tube feeding to 25ml/hr per her order with goal rate 40.  Will continue to monitor.

## 2015-10-20 NOTE — Clinical Social Work Note (Signed)
CSW updated PNC on pt. Facility has started authorization for return to SNF. Will continue to follow.  Benay Pike, [place]

## 2015-10-20 NOTE — Progress Notes (Addendum)
Initial Nutrition Assessment  DOCUMENTATION CODES:   Non-severe (moderate) malnutrition in context of chronic illness   Underweight  INTERVENTION:   Vital 1.2 ml/hr @ 30 ml/hr. Pt tolerating well per nursing continue advancing to goal rate 40 ml/hr.  Add free water -200 ml TID and 30 ml water before and after medications   NUTRITION DIAGNOSIS:   Predicted suboptimal nutrient intake related to altered GI function as evidenced by NPO status.  GOAL:   Provide needs based on ASPEN/SCCM guidelines   MONITOR:   TF tolerance, Labs, Weight trends, Skin  REASON FOR ASSESSMENT:   Consult Enteral/tube feeding initiation and management  ASSESSMENT: Pt looks much brighter today. Her eyes are open and she is talking. She denies any abdominal pain. She does continue to have some diarrhea which may be non-related to EN. Nursing is giving her imodium.    Will advance feeding Vital 1.2 @ 30 ml/hr and follow up after lunch to assess if appropriate for  further advancement. Followed up with pt nurse this afternoon- tolerating enteral feeding. Her glucose is elevated more now. Will go ahead and advance to goal rate.  Her goal rate is 40 ml/hr which will provide 1152 kcal, 72 gr protein and 778 ml water. At goal we'll meet 91% energy and 100% protein needs.  Labs: glucose 170 mg/dl  Diet Order:  Diet NPO time specified  Skin:   surgical wound to neck with minimal drainage per WOC  Last BM:   11/3 diarrhea per nursing  Height:   Ht Readings from Last 1 Encounters:  10/17/15 5\' 2"  (1.575 m)    Weight:   Wt Readings from Last 1 Encounters:  10/20/15 99 lb 10.4 oz (45.2 kg)    Ideal Body Weight:  50 kg  BMI:  Body mass index is 18.22 kg/(m^2).  Estimated Nutritional Needs:   Kcal:  1260  Protein:  63-70 gr  Fluid:  1.3 liters daily  EDUCATION NEEDS:   No education needs identified at this time   Colman Cater MS,RD,CSG,LDN Office: #[phone] Pager: [phone]

## 2015-10-21 LAB — CBC
HCT: 28.7 % — ABNORMAL LOW (ref 36.0–46.0)
HEMOGLOBIN: 9.2 g/dL — AB (ref 12.0–15.0)
MCH: 26 pg (ref 26.0–34.0)
MCHC: 32.1 g/dL (ref 30.0–36.0)
MCV: 81.1 fL (ref 78.0–100.0)
Platelets: 608 10*3/uL — ABNORMAL HIGH (ref 150–400)
RBC: 3.54 MIL/uL — AB (ref 3.87–5.11)
RDW: 15.2 % (ref 11.5–15.5)
WBC: 17.4 10*3/uL — AB (ref 4.0–10.5)

## 2015-10-21 LAB — BASIC METABOLIC PANEL
Anion gap: 5 (ref 5–15)
BUN: 11 mg/dL (ref 6–20)
CHLORIDE: 109 mmol/L (ref 101–111)
CO2: 23 mmol/L (ref 22–32)
Calcium: 7.9 mg/dL — ABNORMAL LOW (ref 8.9–10.3)
Creatinine, Ser: 0.35 mg/dL — ABNORMAL LOW (ref 0.44–1.00)
GFR calc non Af Amer: >60 mL/min (ref >60)
Glucose, Bld: 155 mg/dL — ABNORMAL HIGH (ref 65–99)
POTASSIUM: 3.6 mmol/L (ref 3.5–5.1)
SODIUM: 137 mmol/L (ref 135–145)

## 2015-10-21 LAB — GLUCOSE, CAPILLARY
GLUCOSE-CAPILLARY: 139 mg/dL — AB (ref 65–99)
GLUCOSE-CAPILLARY: 168 mg/dL — AB (ref 65–99)
GLUCOSE-CAPILLARY: 158 mg/dL — AB (ref 65–99)

## 2015-10-21 LAB — PROTIME-INR
INR: 1.3 (ref 0.00–1.49)
PROTHROMBIN TIME: 16.3 s — AB (ref 11.6–15.2)

## 2015-10-21 MED ORDER — AMOXICILLIN-POT CLAVULANATE 200-28.5 MG/5ML PO SUSR: 875.0000 mg | Freq: Two times a day (BID) | ORAL | Status: DC

## 2015-10-21 MED ORDER — LOPERAMIDE HCL 2 MG PO CAPS: 2.0000 mg | ORAL_CAPSULE | ORAL | Status: DC | PRN

## 2015-10-21 MED ORDER — VITAL AF 1.2 CAL PO LIQD: 1000.0000 mL | ORAL | Status: DC

## 2015-10-21 MED ORDER — METOCLOPRAMIDE HCL 10 MG PO TABS: 10.0000 mg | ORAL_TABLET | Freq: Four times a day (QID) | ORAL | Status: DC | PRN

## 2015-10-21 MED ORDER — FREE WATER: 200.0000 mL | Freq: Three times a day (TID) | Status: AC

## 2015-10-21 NOTE — Care Management Important Message (Signed)
Important Message  Patient Details  Name: Laura Melendez MRN: [id number] Date of Birth: 01-14-1941   Medicare Important Message Given:  Yes-second notification given    Sherald Barge, RN 10/21/2015, 12:04 PM

## 2015-10-21 NOTE — Progress Notes (Signed)
Initial Nutrition Assessment  DOCUMENTATION CODES:   Non-severe (moderate) malnutrition in context of chronic illness   Underweight  INTERVENTION:  Vital 1.2 ml/hr @ goal rate now 40 ml/hr and continuing to tolerate well.  Free water -200 ml TID and 30 ml water before and after medications.    NUTRITION DIAGNOSIS:   Predicted suboptimal nutrient intake related to altered GI function as evidenced by NPO status.  GOAL:   Provide needs based on ASPEN/SCCM guidelines   MONITOR:   TF tolerance, Labs, Weight trends, Skin  REASON FOR ASSESSMENT:   Consult Enteral/tube feeding initiation and management  ASSESSMENT: Pt is awake this morning and says she's feeling good as far as no abdominal pain. She is at goal rate now and no problems noted overnight. She had 2 stools yesterday (morning and evening). Her weight has significantly increased 10.7% since admission or from 42.2 kg up to 46.8 kg. Edema noted bilateral upper extremities. Discussed with nursing who is notifying MD.   Her goal rate of 40 ml/hr provides 1152 kcal, 72 gr protein and 778 ml water. At goal we'll meet 91% energy and 100% protein needs.  Labs: glucose 155 mg/dl. WBC still elevated 17.4, PT, INR much improved 16.3 and 1.30 respectively.  Diet Order:  Diet NPO time specified  Skin:   surgical wound to neck with minimal drainage per WOC  Last BM:   11/3 diarrhea per nursing  Height:   Ht Readings from Last 1 Encounters:  10/17/15 5\' 2"  (1.575 m)    Weight:   Wt Readings from Last 1 Encounters:  10/21/15 103 lb 6.3 oz (46.9 kg)    Ideal Body Weight:  50 kg  BMI:  Body mass index is 18.91 kg/(m^2).  Estimated Nutritional Needs:   Kcal:  1260  Protein:  63-70 gr  Fluid:  1.3 liters daily  EDUCATION NEEDS:   No education needs identified at this time   Colman Cater MS,RD,CSG,LDN Office: #[phone] Pager: [phone]

## 2015-10-21 NOTE — Clinical Social Work Note (Signed)
Pt d/c today back to [facility]. Pt's husband and facility aware and agreeable. Per [facility], authorization from [place] received. Pt will transfer with staff.   Benay Pike, [place]

## 2015-10-21 NOTE — Care Management Note (Signed)
Case Management Note  Patient Details  Name: Laura Melendez MRN: [id number] Date of Birth: 1941-05-03  Expected Discharge Date:  10/21/15               Expected Discharge Plan:  Skilled Nursing Facility  In-House Referral:  Clinical Social Work  Discharge planning Services  CM Consult  Post Acute Care Choice:  NA Choice offered to:  NA  DME Arranged:    DME Agency:     HH Arranged:    [place] Agency:     Status of Service:  Completed, signed off  Medicare Important Message Given:  Yes-second notification given Date Medicare IM Given:    Medicare IM give by:    Date Additional Medicare IM Given:    Additional Medicare Important Message give by:     If discussed at [place] of Stay Meetings, dates discussed:    Additional Comments: Pt being discharged to SNF today. CSW is aware and has made arrangements for return to facility. Pt and family are in agreement with DC plan. No CM needs.  Sherald Barge, RN 10/21/2015, 12:05 PM

## 2015-10-21 NOTE — Discharge Summary (Signed)
Physician Discharge Summary  Laura Melendez DVV:[id number] DOB: 06-Mar-1941 DOA: 10/17/2015  PCP: [name], MD  Admit date: 10/17/2015 Discharge date: 10/21/2015  Time spent: 35 minutes  Recommendations for Outpatient Follow-up:  1. Return to [facility].  2. Repeat CBC in 1 week to ensure resolution of leukocytosis.  3. Lisinopril discontinued.  4. Tube feeding changed to Vital AF 1.2. Currently at 16ml/hr.   5. Consider palliative care consult for goals of care.   Discharge Diagnoses:  Principal Problem:   Aspiration pneumonia ([place]) Active Problems:   DM (diabetes mellitus) ([place])   Right hemiparesis ([place])   Sepsis ([place])   Acute respiratory failure with hypoxia (HCC)   Malnutrition of moderate degree   Discharge Condition: Improved  Diet recommendation: Tube feeding changed to Vital AF 1.2. Currently at 21ml/hr.    Further dietary recommendations per ST at [facility].   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 Hospitalization was complicated by left neck hematoma that required surgical incision on and evacuation on 10/4 on postoperative day one and has been left to heal by secondary intention. Subsequently she developed aspiration pneumonia, right pneumothorax requiring chest tube and intubation. Respiratory culture ultimately grew Escherichia coli and Klebsiella and she was treated for pneumonia. Subsequently the patient developed pulmonary edema and echocardiogram showed no signs of congestive heart failure. She ultimately required a PEG tube on 10/17 for dysphagia. While in the ED, she  was found to have acute hypoxic resp failure, sepsis, and aspiration pneumonia. She was admitted for further management.    Hospital Course:  Sepsis secondary to aspiration pneumonia, presumed related to tube feeds, was treated with IVF and IV abx. Blood cultures reveal no growth to date however final results are pending. Moderate leukocytosis remained stable and she remained afebrile.  She is hemodynamically stable.   Acute hypoxic respiratory failure related to PNA resolved with supplemental O2. Patient is now breathing comfortably with stable VS on RA.   1. Diarrhea and hematochezia, likely related to tube feedings, appears resolved with Imodium. Stool cultures show no significant growth. Stool for C. Diff negative.   2. Non-anion gap metabolic acidosis, possibly related to diarrhea.Resolved with bicarbonate infusion.  3. Vomiting, possibly related to tube feeding. Resolved with antiemetics and Reglan. Will continue Reglan as outpatient. No residual in  tube feeding noted on multiple checks.  4. Hyponatremia, resolved with IVF.  5. Hyperkalemia, resolved with IVF. 6. Anemia of chronic disease, stable. Hgb trended down to 9.2. No evidence of bleeding.  7. Pyuria. Urine culture shows insignificant growth. 8. Parotid gland cancer s/p left parotidectomy at [facility] with complicated postoperative course including aspiration pneumonia, pneumothorax and intubation per Dr. Janalyn Rouse note on 10/31. Patient ultimately required PEG tube on 10/03/15. Patient is followed by ST at nursing home and is hopeful to progress to eating solid foods.  9. DM type 2, now stable with one episode of hypoglycemia since admission. Resume Metformin on discharge.  10. Seizure disorder on Keppra. 11. PMH stroke 03/2015, on warfarin. 12. Elevated INR, resolved with small dose of Vit K.  INR 1.30 on discharge. Patient to resume Coumadin on discharge.  13. Non-severe malnutrition, in the context of chronic illness. Followed  by nutrition.   Consultants:  PT- SNF  Procedures:  none  Antibiotics:  Zosyn 10/31>>11/02  Vancomycin 10/31>>11/02  Augmentin 11/02>>11/06  Discharge Exam: Filed Vitals:   10/21/15 0354  BP: 166/66  Pulse: 92  Temp: 98.3 F (36.8 C)  Resp: 18     General: NAD, looks comfortable  Cardiovascular: RRR, S1, S2   Respiratory: Coarse breath sounds at the bases. No wheezing   Abdomen: soft, non tender, no distention , bowel sounds normal  Musculoskeletal:  Trace edema in BLE. 1+ edema in upper extremities.   Discharge Instructions   Discharge Instructions    Diet - low sodium heart healthy    Complete by:  As directed      Increase activity slowly    Complete by:  As directed           Current Discharge Medication List    START taking these medications   Details  amoxicillin-clavulanate (AUGMENTIN) 200-28.5 MG/5ML suspension Place 21.9 mLs (875 mg total) into feeding tube every 12 (twelve) hours. For 2 more days Qty: 100 mL, Refills: 0    loperamide (IMODIUM) 2 MG capsule Take 1 capsule (2 mg total) by mouth every 4 (four) hours as needed for diarrhea or loose stools. Qty: 30 capsule, Refills: 0    metoCLOPramide (REGLAN) 10 MG tablet Place 1 tablet (10 mg total) into feeding tube every 6 (six) hours as needed for nausea.    Nutritional Supplements (FEEDING SUPPLEMENT, VITAL AF 1.2 CAL,) LIQD Place 1,000 mLs into feeding tube daily.    Water For Irrigation, Sterile (FREE WATER) SOLN Place 200 mLs into feeding tube every 8 (eight) hours.      CONTINUE these medications which have NOT CHANGED   Details  collagenase (SANTYL) ointment Apply 1 application topically daily.    enoxaparin (LOVENOX) 40 MG/0.4ML injection Inject 0.4 mLs (40 mg total) into the skin daily. 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    NIFEdipine (PROCARDIA XL/ADALAT-CC) 60 MG 24 hr tablet Take 1 tablet (60 mg total) by mouth daily. DO NOT CRUSH Qty: 30 tablet, Refills: 5    ondansetron (ZOFRAN ODT) 4 MG disintegrating tablet Take 1 tablet (4 mg total) by mouth every 8 (eight) hours as needed for nausea or vomiting. Qty: 20 tablet, Refills: 0    pravastatin (PRAVACHOL) 40 MG tablet Take 1 tablet (40 mg total) by mouth daily. Qty: 30 tablet, Refills: 12    senna-docusate (SENOKOT-S) 8.6-50 MG per tablet Take 2 tablets by mouth 2 (two) times daily. For constipation Qty: 100 tablet, Refills: 1    sodium chloride 1 G tablet Take 1 g by mouth 2 (two) times  daily with a meal.    warfarin (COUMADIN) 2 MG tablet Take 1/2 a pill on Friday evening and take a whole pill on all other days. Qty: 30 tablet, Refills: 5    raloxifene (EVISTA) 60 MG tablet Take 1 tablet (60 mg total) by mouth daily. Qty: 90 tablet, Refills: 1      STOP taking these medications     cephALEXin (KEFLEX) 500 MG capsule      ketoconazole (NIZORAL) 2 % cream      lisinopril (PRINIVIL,ZESTRIL) 2.5 MG tablet        No Known Allergies    The results of significant diagnostics from this hospitalization (including imaging, microbiology, ancillary and laboratory) are listed below for reference.    Significant Diagnostic Studies: Dg Chest 1 View  10/13/2015  CLINICAL DATA:  Cough, aspiration pneumonia, type II diabetes mellitus, essential  hypertension EXAM: CHEST 1 VIEW COMPARISON:  10/08/2015 FINDINGS: Enlargement of cardiac silhouette. Atherosclerotic calcification aorta. Mediastinal contours and pulmonary vascularity normal. Question minimal atelectasis at lung bases. Minimal central peribronchial thickening. No infiltrate, pleural effusion or pneumothorax. Bones demineralized. Retained contrast in abdomen. IMPRESSION: Minimal bronchitic changes and basilar atelectasis. Electronically Signed   By: Lavonia Dana M.D.   On: 10/13/2015 13:15   Mr Brain Wo Contrast  10/13/2015  CLINICAL DATA:  Cerebral infarct two-view unspecified mechanism. Swelling dysfunction. EXAM: MRI HEAD WITHOUT CONTRAST TECHNIQUE: Multiplanar, multiecho pulse sequences of the brain and surrounding structures were obtained without intravenous contrast. COMPARISON:  CT of the head and temporal bone 06/27/2015. MRI brain 03/30/2015. FINDINGS: The diffusion-weighted images demonstrate no evidence for acute infarct. Atrophy and diffuse confluent T2 changes are evident bilaterally. A remote lacunar infarct in the left centrum semi ovale is stable. Remote thymic infarcts are again seen, more prominent on the right. A remote left cerebellar infarct is stable. Brainstem demonstrates mild T2 change, similar to the prior exam. Flow is present in the major intracranial arteries. Bilateral lens replacements are noted. The globes and orbits are otherwise intact. Paranasal sinuses are clear. There is some fluid in the mastoid air cells bilaterally. No obstructing nasopharyngeal lesion is present. IMPRESSION: 1. No acute intracranial abnormality or significant interval change. 2. Stable atrophy and diffuse white matter disease. 3. Remote lacunar infarcts in the thalami bilaterally, worse on the right, and the left cerebellum. Electronically Signed   By: San Morelle M.D.   On: 10/13/2015 15:33   Dg Op Swallowing Func-medicare/speech Path  10/11/2015  [place], [place], [address] Phone: [phone]   Fax:  [phone] Modified Barium Swallow Patient Details  Name: Laura Melendez MRN: [id number] Date of Birth: 1941/08/05 No Data Recorded Encounter Date: 10/11/2015       End of Session - 10/11/15 1650     Visit Number  1    Number of Visits  1    Authorization Type  [employer] medicare    SLP Start Time  9622    SLP Stop Time   1552    SLP Time Calculation (min)  19 min    Activity Tolerance  Patient limited by fatigue      Past Medical History  Diagnosis  Date  .  Essential hypertension, benign    .  Type 2 diabetes mellitus ([place])    .  Osteoporosis  06/2008  .  Stroke [facility])      Past Surgical History  Procedure  Laterality  Date  .  Ankle surgery  Left  2004      otif  .  Abdominal hysterectomy      .  Anterior and posterior repair  N/A  03/11/2013      Procedure: ANTERIOR (CYSTOCELE) AND POSTERIOR REPAIR (RECTOCELE);  Surgeon: Linda Hedges, DO;  Location: [facility];  Service: Gynecology;  Laterality: N/A;  with sacrospinous ligament fixation bilateral  .  Colonoscopy    2009      negative  .  Cataract extraction w/phaco  Right  12/27/2014      Procedure: CATARACT EXTRACTION PHACO AND INTRAOCULAR LENS PLACEMENT RIGHT EYE;  Surgeon: Tonny Branch, MD;  Location: [facility];  Service: Ophthalmology;  Laterality: Right;  CDE:10.63  .  Cataract extraction w/phaco  Left  01/13/2015      Procedure: CATARACT EXTRACTION PHACO AND INTRAOCULAR LENS PLACEMENT LEFT EYE;  Surgeon: Tonny Branch, MD;  Location: [facility];  Service: Ophthalmology;  Laterality: Left;  CDE:18.87  .  Cholecystectomy      .  Tee without cardioversion  N/A  04/01/2015      Procedure: TRANSESOPHAGEAL ECHOCARDIOGRAM (TEE);  Surgeon: Sanda Klein, MD;  Location: [facility];  Service: Cardiovascular;  Laterality: N/A;  There were no vitals filed for this visit. Visit Diagnosis: Dysphagia       Subjective Assessment - 10/11/15 1619     Subjective  Alert, pleasant upright in haustead  chair, appears very weak    Currently in Pain?  No/denies          General - 10/11/15 1621     General Information    Date of Onset  09/19/15    Other Pertinent Information  Laura Melendez is a 74 y.o. female with a medical hx of HTN, Type 2 DM, stroke, who had an elective surgery for left superficial parotidectomy with facial nerve dissection suspicious for malignancy at [facility]. went to the operating room where she underwent a left superficial parotidectomy with facial nerve dissection. On postoperative day one she developed a left neck hematoma and had to go back for surgical incision and evacuation on 10/4. The wound from the neck hematoma was left open and treated with wet to dry saline moist Kerlix dressings and the wounds incision to heal by secondary intention. On 10/4 she went into respiratory distress and required intubation on the floor. It was felt that she had reactive airway swelling in the setting of the hematoma and postoperative swelling. She further developed a right pneumothorax requiring a chest tube removed on 10/10. She was also excessively extubated on 10/10. She then developed a pneumonia with respiratory culture growing Escherichia coli and Klebsiella and she completed a 7 day course of antibiotics ending on 10/14. An x-ray on 10/16 showed pulmonary edema and she was diureses with Lasix. Pt with PEG placement 10-03-15. ST to assess current swallow function.     Type of Study  Other (Comment) MBSS    Reason for Referral  Objectively evaluate swallowing function    Previous Swallow Assessment  MBS April 2016: purre, thin recommendations     Diet Prior to this Study  NPO    Temperature Spikes Noted  N/A unknown     Respiratory Status  Room air    History of Recent Intubation  Yes    Length of Intubations (days)  6 days    Date extubated  09/26/15    Behavior/Cognition  Alert    Oral Cavity - Dentition  Edentulous;Missing dentition;Other (Comment) primarily edentulous  Oral Motor / Sensory  Function  Impaired    Self-Feeding Abilities  Needs assist    Patient Positioning  Upright in chair/Tumbleform    Baseline Vocal Quality  Low vocal intensity    Volitional Cough  Weak    Anatomy  -- surgical removal of parotid gland     Pharyngeal Secretions  Not observed secondary MBS          Oral Preparation/Oral Phase - 10/11/15 1626     Oral Preparation/Oral Phase    Oral Phase  Impaired    Oral - Honey    Oral - Honey Teaspoon  Lingual pumping;Weak ligual manipulation;Imparied mastication;Decreased bolus cohesion;Delayed A-P transit;Oral residue    Oral - Honey Cup  Weak ligual manipulation;Lingual pumping;Imparied mastication;Decreased bolus cohesion;Delayed A-P transit;Oral residue    Oral - Nectar    Oral - Nectar Teaspoon  Weak ligual manipulation;Lingual pumping;Imparied mastication;Oral residue;Delayed A-P transit;Decreased bolus cohesion    Oral - Thin    Oral - Thin Teaspoon  Weak ligual manipulation;Lingual pumping;Imparied mastication;Decreased bolus cohesion;Delayed A-P transit;Oral residue    Oral - Thin Cup  Weak ligual manipulation;Lingual pumping;Imparied mastication;Delayed A-P transit;Decreased bolus cohesion;Oral residue    Oral - Solids    Oral - Puree  Weak ligual manipulation;Imparied mastication;Delayed A-P transit;Decreased bolus cohesion;Oral residue    Electrical stimulation - Oral Phase    Was Electrical Stimulation Used  No          Pharyngeal Phase - 10/11/15 1630     Pharyngeal Phase    Pharyngeal Phase  Impaired    Pharyngeal - Honey    Pharyngeal- Honey Teaspoon  Swallow initiation at vallecula;Delayed swallow initiation    Pharyngeal- Honey Cup  Delayed swallow initiation;Pharyngeal residue - valleculae;Swallow initiation at vallecula    Pharyngeal - Nectar    Pharyngeal- Nectar Teaspoon  Delayed swallow initiation;Penetration/Aspiration during swallow;Penetration/Apiration after swallow 5: material enters airway contacts VC and is not ejected    Pharyngeal - Thin    Pharyngeal-  Thin Teaspoon  Delayed swallow initiation;Swallow initiation at pyriform sinus;Lateral channel residue;Pharyngeal residue - pyriform    Pharyngeal - Solids    Pharyngeal- Puree  Delayed swallow initiation;Penetration/Aspiration during swallow;Penetration/Apiration after swallow;Swallow initiation at vallecula 7; material passes below TVC, and is not ejected despite effort     Electrical Stimulation - Pharyngeal Phase    Was Electrical Stimulation Used  No          Plan - 10/11/15 1655     Clinical Impression Statement  Pt presents with moderate to severe sensory motor oropharygneal dysphagia of multi factorial etiology (post left superficial parotidectomy with facial nerve dissection, decreased respiratory support inclusive of 6 day intubation, extubation date noted 09-26-15, and generalized weakness including oral and pharyngeal musculature). Pt with weak lingual manipulation of all bolus consistencies leading to premature spillage and severe delay in swallow initiation. Aspiration evidenced with thin liquids via cup sip which was sensed but not effectively cleared by weak reflexive cough. Pt with oral residuals of thin liquids which later pentrated to the level of the true vocal cords, further compromising patients airway protection after the swallow. Pt with deep penetration of nectar thick liquids by teaspoon. No penetration or aspiration of honey thick liquids or puree consistencies. However larger honey thick liquid sip size yielded vallecular residuals. Recommend puree diet and honey thick liquids via teaspoon with SLP only and medicines via alternative means. Okay for treating SLP to clinically upgrade conservatively as tolerated given anticipated recovery. Pt is very  high risk for aspiration given recent severity of surgical procedures and medical comorbidities further compromising strength and endurance.           G-Codes - 10/12/15 1651     Functional Assessment Tool Used  MBSS; clinical judgement     Functional Limitations  Swallowing    Swallow Current Status (E5277)  At least 80 percent but less than 100 percent impaired, limited or restricted    Swallow Goal Status (O2423)  At least 40 percent but less than 60 percent impaired, limited or restricted    Swallow Discharge Status [id number]  At least 80 percent but less than 100 percent impaired, limited or restricted          Recommendations/Treatment - 2015/10/12 1646     Swallow Evaluation Recommendations    Recommended Consults  Other (Comment) SLP follow at SNF    SLP Diet Recommendations  Dysphagia 1 (Puree);Honey honey thick liquids via tsp with SLP only     Liquid Administration via  Spoon    Medication Administration  Via alternative means    Supervision  Full supervision/cueing for compensatory strategies;Staff to assist with self feeding    Compensations  Multiple dry swallows after each bite/sip;Minimize environmental distractions;Feed no longer than 30 minutes;Slow rate    Postural Changes  Seated upright at 90 degrees;Remain semi-upright after after feeds/meals (Comment)          Prognosis - 10-12-15 1649     Prognosis    Prognosis for Safe Diet Advancement  Fair    Barriers to Reach Goals  Severity of deficits    Individuals Consulted    Consulted and Agree with Results and Recommendations  Patient    Report Sent to   Primary SLP    Problem List Patient Active Problem List    Diagnosis  Date Noted  .  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  Dysphagia due to recent stroke  04/04/2015  .  Right hemiparesis ([place])  04/04/2015  .  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  Aortic thrombus ([place])    .  Stroke with cerebral ischemia ([place])    .  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  DM (diabetes mellitus) ([place])  03/23/2014  .  Osteoporosis, unspecified  03/23/2014  .  Dyslipidemia  03/23/2014  .  Tobacco abuse  03/23/2014  .  Syncope  01/01/2014  .  Essential  hypertension, benign  01/01/2014  Arvil Chaco MA, [facility] Speech Language Pathologist  Levi Aland 2015-10-12, 5:28 PM [place] [address] [place], [place], [address] Phone: [phone]   Fax:  [phone] Name: Laura Melendez MRN: [id number] Date of Birth: 06-02-1941            Close Encounter  click to open   Close Encounter   Scroll Back to Top CLINICAL DATA:  Dysphagia. EXAM: MODIFIED BARIUM SWALLOW TECHNIQUE: Different consistencies of barium were administered orally to the patient by the Speech Pathologist. Imaging of the pharynx was performed in the lateral projection. FLUOROSCOPY TIME:  Fluoroscopy Time:  3 minutes 24 seconds. Number of Acquired Images:  1. COMPARISON:  None. FINDINGS: Thin liquid- tracheal penetration and aspiration was noted. Nectar thick liquid- tracheal penetration was noted. Honey- within normal limits. Pure- delayed oral transit. No aspiration or penetration was noted. Cracker-not administered. Pure with  cracker- not administered. Barium tablet -  not administered. IMPRESSION: Tracheal penetration and aspiration was noted with thin liquid. Tracheal penetration was noted with nectar thick liquid. Please refer to the Speech Pathologists report for complete details and recommendations. Electronically Signed   By: Marijo Conception, M.D.   On: 10/11/2015 16:02   Dg Abd Acute W/chest  10/17/2015  CLINICAL DATA:  Nausea and vomiting, possible aspiration. EXAM: DG ABDOMEN ACUTE W/ 1V CHEST COMPARISON:  Chest x-ray of October 13, 2015 FINDINGS: Chest x-ray: The lungs are adequately inflated. There are increased densities in the lower lobes bilaterally. These are more conspicuous than in the past. The heart is top-normal in size but stable. The pulmonary vascularity is normal. The mediastinum is normal in width. Within the abdomen there is a moderate amount of contrast noted throughout the normal caliber colon. A gastrostomy  tube is in place. The stomach and small bowel do not appear abnormally distended. There is gentle levocurvature of the mid lumbar spine. IMPRESSION: 1. Increased density at both lung bases worrisome for the clinically suspected aspiration and development of atelectasis or pneumonia. Stable enlargement cardiac silhouette without pulmonary edema. 2. The bowel gas pattern is within the limits of normal with exception of the presence of a moderate amount of contrast in the normal calibered colon. This contrast may be residual from previous studies. I do not have history of recent oral contrast administration. Electronically Signed   By: David  Martinique M.D.   On: 10/17/2015 09:50   Dg Abd Acute W/chest  10/08/2015  CLINICAL DATA:  Nausea and vomiting, recent parotid surgery with postoperative hemorrhage and pneumonia EXAM: DG ABDOMEN ACUTE W/ 1V CHEST COMPARISON:  01/30/2015 FINDINGS: Rotated to the RIGHT. Normal heart size, mediastinal contours, and pulmonary vascularity. Atherosclerotic calcification aorta. Lungs clear. No pleural effusion or pneumothorax. Gastrostomy tube LEFT upper quadrant. Scattered atherosclerotic calcifications. Normal bowel gas pattern. No bowel dilatation, bowel wall thickening, or free intraperitoneal air. Bones demineralized with levoconvex lumbar scoliosis. IMPRESSION: No acute abnormalities. Electronically Signed   By: Lavonia Dana M.D.   On: 10/08/2015 10:06    Microbiology: Recent Results (from the past 240 hour(s))  Culture, blood (routine x 2)     Status: None (Preliminary result)   Collection Time: 10/17/15  9:47 AM  Result Value Ref Range Status   Specimen Description BLOOD RIGHT ANTECUBITAL DRAWN BY RN  Final   Special Requests BOTTLES DRAWN AEROBIC AND ANAEROBIC [place]  Final   Culture NO GROWTH 4 DAYS  Final   Report Status PENDING  Incomplete  Culture, blood (routine x 2)     Status: None (Preliminary result)   Collection Time: 10/17/15 10:00 AM  Result Value Ref  Range Status   Specimen Description BLOOD LEFT ANTECUBITAL  Final   Special Requests BOTTLES DRAWN AEROBIC AND ANAEROBIC [place]  Final   Culture NO GROWTH 4 DAYS  Final   Report Status PENDING  Incomplete  Urine culture     Status: None   Collection Time: 10/17/15 10:05 AM  Result Value Ref Range Status   Specimen Description URINE, CATHETERIZED  Final   Special Requests NONE  Final   Culture   Final    >=100,000 COLONIES/mL YEAST Performed at [facility]    Report Status 10/18/2015 FINAL  Final  MRSA PCR Screening     Status: None   Collection Time: 10/17/15  2:00 PM  Result Value Ref Range Status   MRSA by PCR NEGATIVE NEGATIVE  Final    Comment:        The GeneXpert MRSA Assay (FDA approved for NASAL specimens only), is one component of a comprehensive MRSA colonization surveillance program. It is not intended to diagnose MRSA infection nor to guide or monitor treatment for MRSA infections.   Stool culture     Status: None (Preliminary result)   Collection Time: 10/19/15 12:00 AM  Result Value Ref Range Status   Specimen Description STOOL  Final   Special Requests NONE  Final   Culture   Final    NO SUSPICIOUS COLONIES, CONTINUING TO HOLD Performed at [facility]    Report Status PENDING  Incomplete  C difficile quick scan w PCR reflex     Status: None   Collection Time: 10/19/15 12:00 AM  Result Value Ref Range Status   C Diff antigen NEGATIVE NEGATIVE Final   C Diff toxin NEGATIVE NEGATIVE Final   C Diff interpretation Negative for toxigenic C. difficile  Final     Labs: Basic Metabolic Panel:  Recent Labs Lab 10/17/15 0945 10/18/15 0508 10/19/15 0630 10/20/15 0557 10/21/15 0642  NA 128* 137 136 139 137  K 5.2* 3.7 3.3* 3.5 3.6  CL 93* 107 109 109 109  CO2 26 25 17* 24 23  GLUCOSE 255* 75 169* 128* 155*  BUN 27* 17 16 13 11   CREATININE 0.63 0.54 0.56 0.42* 0.35*  CALCIUM 7.7* 7.3* 7.0* 7.2* 7.9*   Liver Function  Tests:  Recent Labs Lab 10/17/15 0945  AST 31  ALT 28  ALKPHOS 51  BILITOT 0.4  PROT 5.1*  ALBUMIN 2.0*    Recent Labs Lab 10/17/15 0945  LIPASE 33    CBC:  Recent Labs Lab 10/17/15 0945 10/18/15 0508 10/19/15 0630 10/20/15 0557 10/21/15 0642  WBC 22.0* 12.9* 17.6* 17.2* 17.4*  NEUTROABS 20.8*  --   --   --   --   HGB 10.5* 7.4* 11.0* 9.2* 9.2*  HCT 32.1* 22.6* 34.1* 28.5* 28.7*  MCV 80.5 81.3 81.0 80.5 81.1  PLT 516* 411* 641* 604* 608*   Cardiac Enzymes:  Recent Labs Lab 10/17/15 0945  TROPONINI <0.03   BNP: BNP (last 3 results)  Recent Labs  10/17/15 0945  BNP 118.0*     CBG:  Recent Labs Lab 10/20/15 2036 10/20/15 2357 10/21/15 0353 10/21/15 0724 10/21/15 1133  GLUCAP 140* 118* 139* 168* 158*       Signed:  Jehanzeb Memon. MD  [facility] 10/21/2015, 11:58 AM  By signing my name below, I, Rosalie Doctor, attest that this documentation has been prepared under the direction and in the presence of [facility]. MD Electronically Signed: Rosalie Doctor, Scribe. 10/21/2015 11:09am  I, Dr. [name], personally performed the services described in this documentaiton. All medical record entries made by the scribe were at my direction and in my presence. I have reviewed the chart and agree that the record reflects my personal performance and is accurate and complete  [name], MD, 10/21/2015 11:58 AM

## 2015-10-22 LAB — CULTURE, BLOOD (ROUTINE X 2)
CULTURE: NO GROWTH
CULTURE: NO GROWTH

## 2015-10-23 ENCOUNTER — Non-Acute Institutional Stay (SKILLED_NURSING_FACILITY): Payer: Medicare HMO | Admitting: Internal Medicine

## 2015-10-23 ENCOUNTER — Other Ambulatory Visit (HOSPITAL_COMMUNITY)
Admission: AD | Admit: 2015-10-23 | Discharge: 2015-10-23 | Disposition: A | Discharge status: Home / Self Care | Payer: Medicare HMO | Source: Skilled Nursing Facility | Attending: Internal Medicine | Admitting: Internal Medicine

## 2015-10-23 DIAGNOSIS — J9601 Acute respiratory failure with hypoxia: Secondary | ICD-10-CM

## 2015-10-23 DIAGNOSIS — R197 Diarrhea, unspecified: Secondary | ICD-10-CM | POA: Diagnosis not present

## 2015-10-23 DIAGNOSIS — F322 Major depressive disorder, single episode, severe without psychotic features: Secondary | ICD-10-CM | POA: Diagnosis not present

## 2015-10-23 DIAGNOSIS — R1314 Dysphagia, pharyngoesophageal phase: Secondary | ICD-10-CM

## 2015-10-23 DIAGNOSIS — J690 Pneumonitis due to inhalation of food and vomit: Secondary | ICD-10-CM | POA: Diagnosis not present

## 2015-10-23 LAB — C DIFFICILE QUICK SCREEN W PCR REFLEX
C DIFFICILE (CDIFF) INTERP: NEGATIVE
C DIFFICILE (CDIFF) TOXIN: NEGATIVE
C DIFFICLE (CDIFF) ANTIGEN: NEGATIVE

## 2015-10-23 LAB — STOOL CULTURE

## 2015-10-23 LAB — PROTIME-INR
INR: 1.47 (ref 0.00–1.49)
PROTHROMBIN TIME: 17.9 s — AB (ref 11.6–15.2)

## 2015-10-23 NOTE — Progress Notes (Signed)
Patient ID: Laura Melendez, female   DOB: 1940/12/21, 74 y.o.   MRN: [id number]    Facility; [facility] Chief complaint; readmission to the facility post stay at [facility] 10/31 through 10/21/2015. Previously at [facility] 10/3 through 10/21. 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 Her postop course complicated by a a postoperative hematoma on postoperative day one, subsequently gram-negative pneumonia requiring intubation, a pneumothorax, congestive heart failure with an echocardiogram showing normal left ventricular function. After all of this she survived however she could not wallow sufficiently to meet her nutritional needs therefore she had a PEG tube placed at [facility]. She spent 10 days here with multiple episodes of vomiting without elevated gastric residuals feedings. On the day of transfer to hospital on 10/31 she developed frank gross aspiration pneumonia was tachycardic and hypoxemic and was sent the hospital. Prior to that transfer I repeated an MRI of her head that did not show any acute infarction sufficient to explain her dysphagia however she could not tolerate any oral feedings here.  During this hospitalization she was treated with antibiotics for her aspiration pneumonia. Her hypoxemia and tachypnea stabilized. Other issues included diarrhea with hematochezia felt to be secondary to tube feedings and she is basically now on an elemental feeding [Vital}. Her vomiting improved with this new elemental feeding, antiemetics and Reglan. She had a stool that was negative for C. difficile. X-ray of the chest and abdomen as below. Check electrolyte abnormalities including hyponatremia and hyperkalemia that resolved with tube feeding. She had continued leukocytosis and an elevated INR [on Coumadin]. All of these issues were addressed. She left the hospital  with a white count however of 17. Cultures of blood were negative. Cultures of stool showed no suspicious colonies. Stool for C. difficile was negative for C. difficile toxin and antigen on 10/19/15. Her urine culture grew yeast  CBC Latest Ref Rng 10/21/2015 10/20/2015 10/19/2015  WBC 4.0 - 10.5 K/uL 17.4(H) 17.2(H) 17.6(H)  Hemoglobin 12.0 - 15.0 g/dL 9.2(L) 9.2(L) 11.0(L)  Hematocrit 36.0 - 46.0 % 28.7(L) 28.5(L) 34.1(L)  Platelets 150 - 400 K/uL 608(H) 604(H) 641(H)   BMP Latest Ref Rng 10/21/2015 10/20/2015 10/19/2015  Glucose 65 - 99 mg/dL 155(H) 128(H) 169(H)  BUN 6 - 20 mg/dL 11 13 16   Creatinine 0.44 - 1.00 mg/dL 0.35(L) 0.42(L) 0.56  BUN/Creat Ratio 11 - 26 - - -  Sodium 135 - 145 mmol/L 137 139 136  Potassium 3.5 - 5.1 mmol/L 3.6 3.5 3.3(L)  Chloride 101 - 111 mmol/L 109 109 109  CO2 22 - 32 mmol/L 23 24 17(L)  Calcium 8.9 - 10.3 mg/dL 7.9(L) 7.2(L) 7.0(L)      EXAM: DG ABDOMEN ACUTE W/ 1V CHEST   COMPARISON:  Chest x-ray of October 13, 2015   FINDINGS: Chest x-ray: The lungs are adequately inflated. There are increased densities in the lower lobes bilaterally. These are more conspicuous than in the past. The heart is top-normal in size but stable. The pulmonary vascularity is normal. The mediastinum is normal in width.   Within the abdomen there is a moderate amount of contrast noted throughout the normal caliber colon. A gastrostomy tube is in place. The stomach and small bowel do not appear abnormally distended. There is gentle levocurvature of the mid lumbar spine.   IMPRESSION: 1. Increased density at both lung bases worrisome for the clinically suspected  aspiration and development of atelectasis or pneumonia. Stable enlargement cardiac silhouette without pulmonary edema. 2. The bowel gas pattern is within the limits of normal with exception of the presence of a moderate amount of contrast in the normal calibered colon. This contrast may be residual from  previous studies. I do not have history of recent oral contrast administration.     Electronically Signed  ------------------------------------------------------------------- Transesophageal Echocardiography  Patient:    Laura Melendez, Laura Melendez MR #:       [id number] Study Date: 04/01/2015 Gender:     F Age:        74 Height:     154.9 cm Weight:     45.5 kg BSA:        1.4 m^2 Pt. Status: Room:       Simpson, RCS  PERFORMING   Sanda Klein, MD  ATTENDING    Lala Lund K  ADMITTING    Marzetta Board M  cc:  ------------------------------------------------------------------- LV EF: 55% -   60%  ------------------------------------------------------------------- Indications:      CVA 436.  ------------------------------------------------------------------- History:   PMH:   Syncope.  Risk factors:  Former tobacco use. Diabetes mellitus. Dyslipidemia.  ------------------------------------------------------------------- Study Conclusions  - Left ventricle: Systolic function was normal. The estimated   ejection fraction was in the range of 55% to 60%. Wall motion was   normal; there were no regional wall motion abnormalities. Doppler   parameters are consistent with abnormal left ventricular   relaxation (grade 1 diastolic dysfunction). - Aortic valve: Bicuspid; mildly thickened, mildly calcified   leaflets. There was mild stenosis. There was trivial   regurgitation. - Aorta: There is severe atheromatous plaque in the aortic arch (up   to 5 mm thickness) with extensive and deep ulceration, but   without mobile thrombus. - Left atrium: No evidence of thrombus in the atrial cavity or   appendage. - Atrial septum: No defect or patent foramen ovale was identified.   There was no atrial level shunt. There was an atrial septal   aneurysm.  Diagnostic transesophageal echocardiography.   2D and color Doppler.  Birthdate:  Patient birthdate: [birth year].  Age:  Patient is 74 yr old.  Sex:  Gender: female.    BMI: 18.9 kg/m^2.  Blood pressure:   159/90  Patient status:  Inpatient.  Study date:  Study date: 04/01/2015. Study time: 01:16 PM.  Location:  Endoscopy.  -------------------------------------------------------------------  ------------------------------------------------------------------- Left ventricle:  Systolic function was normal. The estimated ejection fraction was in the range of 55% to 60%. Wall motion was normal; there were no regional wall motion abnormalities. Doppler parameters are consistent with abnormal left ventricular relaxation (grade 1 diastolic dysfunction). There was no evidence of elevated ventricular filling pressure by Doppler parameters.  ------------------------------------------------------------------- Aortic valve:   Bicuspid; mildly thickened, mildly calcified leaflets.  Doppler:   There was mild stenosis.   By planimetry, aortic valve area is 1.66 cm sq. There was trivial regurgitation.   ------------------------------------------------------------------- Aorta:  There is severe atheromatous plaque in the aortic arch (up to 5 mm thickness) with extensive and deep ulceration, but without mobile thrombus. The aorta was not dilated.  ------------------------------------------------------------------- Mitral valve:   Structurally normal valve.   Leaflet separation was normal.  No evidence of vegetation.  Doppler:  There was no regurgitation.  ------------------------------------------------------------------- Left atrium:  The atrium was normal in size.  No evidence of thrombus in the atrial cavity or appendage.  Emptying velocity was normal.  ------------------------------------------------------------------- Atrial septum:  No defect or patent foramen ovale was identified. There was no atrial level shunt. There was an atrial  septal aneurysm.  ------------------------------------------------------------------- Pulmonic valve:    Structurally normal valve.   Cusp separation was normal.  No evidence of vegetation.  ------------------------------------------------------------------- Tricuspid valve:   Structurally normal valve.   Leaflet separation was normal.  No evidence of vegetation.  Doppler:  There was mild regurgitation.  ------------------------------------------------------------------- Right atrium:  The atrium was normal in size.  No evidence of thrombus in the atrial cavity or appendage.  ------------------------------------------------------------------- Pericardium:  There was no pericardial effusion.   ------------------------------------------------------------------- Post procedure conclusions Ascending Aorta:  - There is severe atheromatous plaque in the aortic arch (up to 5   mm thickness) with extensive and deep ulceration, but without   mobile thrombus. The aorta was not dilated.  -------------------------------------------------------------------   EXAM: CT ANGIOGRAPHY CHEST WITH CONTRAST   TECHNIQUE: Multidetector CT imaging of the chest was performed using the standard protocol during bolus administration of intravenous contrast. Multiplanar CT image reconstructions and MIPs were obtained to evaluate the vascular anatomy.   CONTRAST:  167mL OMNIPAQUE IOHEXOL 300 MG/ML  SOLN   COMPARISON:  None.   FINDINGS: Unenhanced CT, there is no evidence of mediastinal hematoma.   No evidence of thoracic aortic aneurysm or dissection.   Atherosclerotic calcifications of the aortic arch/descending thoracic aorta. Eccentric mural thrombus along the proximal descending thoracic aorta (series 4/image 23) and along the distal descending thoracic aorta (series 4/ image 78).   Although not tailored for evaluation of the pulmonary arteries, there is no evidence of pulmonary embolism to  the segmental level.   Mediastinum/Nodes: Heart is top-normal in size. No pericardial effusion.   No suspicious mediastinal, hilar, or axillary lymphadenopathy.   Visualized thyroid is enlarged/ nodular.   Lungs/Pleura: Mild dependent atelectasis in the bilateral upper lobes.   No suspicious pulmonary nodules.   No focal consolidation.   No pleural effusion or pneumothorax.   Upper abdomen: Visualized upper abdomen is within normal limits.   Musculoskeletal: Mild thoracic dextroscoliosis.   Review of the MIP images confirms the above findings.   IMPRESSION: No overt cardioembolic source identified. 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   No evidence of thoracic aortic aneurysm or dissection.   No evidence of pulmonary embolism.     Electronically Signed   By: Julian Hy M.D.   On: 03/31/2015 21:35     EXAM: MRI HEAD WITHOUT CONTRAST   MRA HEAD WITHOUT CONTRAST   TECHNIQUE: Multiplanar, multiecho pulse sequences of the brain and surrounding structures were obtained without intravenous contrast. Angiographic images of the head were obtained using MRA technique without contrast.   COMPARISON:  Head CT 03/30/2015 and MRI 06/21/2005   FINDINGS: MRI HEAD FINDINGS   There are several punctate acute infarcts in the high, medial left frontal lobe which are predominantly subcortical in location. There is no evidence of intracranial hemorrhage, mass, midline shift, or extra-axial fluid collection. There is mild generalized cerebral atrophy. Patchy and confluent T2 hyperintensities in the periventricular greater than subcortical cerebral white matter have increased from the prior MRI and are nonspecific but compatible with moderate chronic small vessel ischemic disease. Chronic lacunar infarcts are noted in the left corona radiata and right greater than left thalami. A  small, chronic  left cerebellar infarct is also noted.   Prior bilateral cataract extraction is noted. Paranasal sinuses mastoid air cells are clear. Major intracranial vascular flow voids are preserved.   MRA HEAD FINDINGS   Images are mildly to moderately motion degraded. Visualized distal vertebral arteries are patent and codominant without stenosis. Right PICA origin is patent. Left PICA is not identified. AICA and SCA origins are patent. Basilar artery is patent without stenosis. There is a fetal origin of the left PCA. There is moderate right P1 segment stenosis.   Internal carotid arteries are patent from skullbase to carotid termini without evidence of significant stenosis. M1 and A1 segments are patent without stenosis. A2 segments and MCA branch vessels are suboptimally evaluated due to motion artifact but appear patent without significant proximal stenosis identified. No intracranial aneurysm is identified.   IMPRESSION: 1. Punctate acute left frontal lobe infarcts. 2. Moderate chronic small vessel ischemic disease and chronic lacunar infarcts as above. 3. No major intracranial arterial occlusion or significant proximal anterior circulation stenosis. Moderate proximal right PCA stenosis.     Electronically Signed   By: Logan Bores   On: 03/30/2015 16:52     Past Medical History  Diagnosis Date  . Essential hypertension, benign   . Type 2 diabetes mellitus ([place])   . Osteoporosis 06/2008  . 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 History of parotid cancer     Past Surgical History  Procedure Laterality Date  . Ankle surgery Left 2004    otif  . Abdominal hysterectomy    . Anterior and posterior repair N/A 03/11/2013    Procedure: ANTERIOR (CYSTOCELE) AND POSTERIOR REPAIR (RECTOCELE);  Surgeon: Linda Hedges, DO;  Location: [facility];  Service: Gynecology;  Laterality: N/A;  with sacrospinous ligament fixation bilateral  . Colonoscopy  2009    negative   . Cataract extraction w/phaco Right 12/27/2014    Procedure: CATARACT EXTRACTION PHACO AND INTRAOCULAR LENS PLACEMENT RIGHT EYE;  Surgeon: Tonny Branch, MD;  Location: [facility];  Service: Ophthalmology;  Laterality: Right;  CDE:10.63  . Cataract extraction w/phaco Left 01/13/2015    Procedure: CATARACT EXTRACTION PHACO AND INTRAOCULAR LENS PLACEMENT LEFT EYE;  Surgeon: Tonny Branch, MD;  Location: [facility];  Service: Ophthalmology;  Laterality: Left;  CDE:18.87  . Cholecystectomy    . Tee without cardioversion N/A 04/01/2015    Procedure: TRANSESOPHAGEAL ECHOCARDIOGRAM (TEE);  Surgeon: Sanda Klein, MD;  Location: [facility];  Service: Cardiovascular;  Laterality: N/A;  . Peg placement    . Parotidectomy      Current Outpatient Prescriptions on File Prior to Visit  Medication Sig Dispense Refill  . amoxicillin-clavulanate (AUGMENTIN) 200-28.5 MG/5ML suspension Place 21.9 mLs (875 mg total) into feeding tube every 12 (twelve) hours. For 2 more days 100 mL 0  . collagenase (SANTYL) ointment Apply 1 application topically daily.    Marland Kitchen enoxaparin (LOVENOX) 40 MG/0.4ML injection Inject 0.4 mLs (40 mg total) into the skin daily. 14 Syringe 0  . FLUoxetine (PROZAC) 10 MG capsule TAKE 1 CAPSULE BY MOUTH DAILY FOR DEPRESSION/ANXIETY 90 capsule 1  . levETIRAcetam (KEPPRA) 750 MG tablet TAKE 1 TABLET BY MOUTH EVERY MORNING AND 2 TABLETS BY MOUTH EVERY NIGHT AT BEDTIME 270 tablet 3  . loperamide (IMODIUM) 2 MG capsule Take 1 capsule (2 mg total) by mouth every 4 (four) hours as needed for diarrhea or loose stools. 30 capsule 0  . metFORMIN (GLUCOPHAGE) 500 MG tablet Take one half tablet BID (Patient taking differently:  Take 250 mg by mouth 2 (two) times daily with a meal. ) 30 tablet 5  . methylphenidate (RITALIN) 5 MG tablet Take one tablet per tube twice daily with breakfast and lunch 60 tablet 0  . metoCLOPramide (REGLAN) 10 MG tablet Place 1 tablet (10 mg total) into feeding tube every 6 (six) hours as needed  for nausea.    . metoprolol tartrate (LOPRESSOR) 25 MG tablet Take 25 mg by mouth 2 (two) times daily.    Marland Kitchen NIFEdipine (PROCARDIA XL/ADALAT-CC) 60 MG 24 hr tablet Take 1 tablet (60 mg total) by mouth daily. DO NOT CRUSH 30 tablet 5  . Nutritional Supplements (FEEDING SUPPLEMENT, VITAL AF 1.2 CAL,) LIQD Place 1,000 mLs into feeding tube daily.    . ondansetron (ZOFRAN ODT) 4 MG disintegrating tablet Take 1 tablet (4 mg total) by mouth every 8 (eight) hours as needed for nausea or vomiting. 20 tablet 0  . pravastatin (PRAVACHOL) 40 MG tablet Take 1 tablet (40 mg total) by mouth daily. 30 tablet 12  . raloxifene (EVISTA) 60 MG tablet Take 1 tablet (60 mg total) by mouth daily. (Patient not taking: Reported on 10/17/2015) 90 tablet 1  . senna-docusate (SENOKOT-S) 8.6-50 MG per tablet Take 2 tablets by mouth 2 (two) times daily. For constipation 100 tablet 1  . sodium chloride 1 G tablet Take 1 g by mouth 2 (two) times daily with a meal.    . warfarin (COUMADIN) 2 MG tablet Take 1/2 a pill on Friday evening and take a whole pill on all other days. (Patient taking differently: Take 1-2 mg by mouth See admin instructions. Take 1 mg on Tues, Thurs, and Sat.  Take 2 mg on Sun, Mon, Wed, and Fri.) 30 tablet 5  . Water For Irrigation, Sterile (FREE WATER) SOLN Place 200 mLs into feeding tube every 8 (eight) hours.      Social history; prior to the [facility] hospitalization I think this lady was living independently with her husband. She had had a stroke in April but was not having any difficulties with swallowing.  reports that she quit smoking about 7 months ago. Her smoking use included Cigarettes. She has a 7.5 pack-year smoking history. She does not have any smokeless tobacco history on file. She reports that she does not drink alcohol or use illicit drugs.  indicated that her mother is deceased. She indicated that her father is deceased.   Review of systems; this is virtually impossible from the patient  did she really won't talk. However in discussing things with her family and the nurses I am able to come up with the following Respiratory no overt cough GI; no further vomiting on the elemental feeding she is on now however she has continuous diarrhea without melena or hematochezia Skin; widespread rash over her coccyx and buttocks. Rash on her right greater than left foot   Physical examination Gen. frail/tired/depressed-looking elderly woman. She is alert but not very verbal Vitals; blood pressure 159/82, respirations 20 pulse 114 temperature 98.6 most recent weight at 90 pounds HEENT mucous membranes are dry no other oral lesions are seen no thrush. Lymph nonpalpable in the cervical clavicular axilla rate or inguinal areas Respiratory; shallow but otherwise clear entry bilaterally Cardiac heart sounds are normal no murmurs. She appears to be euvolemic Abdomen; mildly distended. Bowel sounds are positive she is diffusely tender especially in the epigastric and right upper quadrant there is no guarding no rebound GU; Foley catheter in place no suprapubic or costovertebral  angle tenderness. Musculoskeletal; no active joints are seen Neurologic; she does not have antigravity strength in her bilateral hip flexorsr. Cranial nerves; she has absolutely no gag reflex,? LR 6. She does not have upper downgaze. Her reflexes are normal toes are downgoing. Mental status; not much change here. She is I think alert and cognitively intact and will respond but does not verbalize. I don't think there is a primary speech issue here Skin; in her right foot are several areas that look like splinter hemorrhages especially on the right fifth toe. There are lesser changes on the left. I suspect these are atheromatous emboli. She has what appears to be a fungal/contact dermatitis rash widely in her buttocks and lower coccyx  Impression/plan #1 severe dysphagia with gross aspiration pneumonitis. Her pneumonia has been   treated at the hospital. The exact reason for her dysphagia is not clear to me. I did an MRI of the brain that did not show a new stroke versus the one she had an April 2016. I wondered whether she might have a cranial neuropathy of some sort, I would like to redo this exam perhaps tomorrow. There are some abnormalities but the patient cooperation seems limited. #2 continued copious diarrhea. Stool for C. difficile and culture were negative in the hospital. I wonder whether she has ischemic colitis based on the atheromatous area in her descending aorta previously identified. I think this splinter hemorrhages and purple spot on her right great toe for indicative of embolic phenomenon. She also has significant abdominal tenderness #3 hematochezia listed in the hospital along with diarrhea. I did not previously seen this. #4 on Coumadin; I wonder about Lovenox instead #5 she is on Senokot-S which would make any sense with the amount of diarrhea this woman is having. We will also need to increase her free fluid as she is obviously losing fluid in the diarrheal fluid. Probably also losing potassium #6 profound lower extremity weakness; this all could be disuse. I like a better chance to go over her neurologic exam a little more thoroughly. Her cooperation with me is also less than 100% #7 seizure disorder on Keppra #8 type 2 diabetes with her metformin resumed. 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 #10 anemia of chronic disease apparently going downward. She has an albumin of 2 #11 severe protein calorie malnutrition  Overall the patient's cardio and respiratory status is improved. She looks generally worse however even weaker than she was last time. I would like the opportunity to a more complete neurologic exam. I think she needs imaging of her abdomen and I'll check with radiology tomorrow question MRA/MRA versus CT scan with IV  contrast.

## 2015-10-24 ENCOUNTER — Encounter (HOSPITAL_COMMUNITY)
Admission: RE | Admit: 2015-10-24 | Discharge: 2015-10-24 | Disposition: A | Discharge status: Home / Self Care | Payer: Medicare HMO | Source: Skilled Nursing Facility | Attending: Internal Medicine | Admitting: Internal Medicine

## 2015-10-24 ENCOUNTER — Other Ambulatory Visit: Payer: Self-pay | Admitting: *Deleted

## 2015-10-24 ENCOUNTER — Non-Acute Institutional Stay (SKILLED_NURSING_FACILITY): Payer: Medicare HMO | Admitting: Internal Medicine

## 2015-10-24 DIAGNOSIS — R1314 Dysphagia, pharyngoesophageal phase: Secondary | ICD-10-CM | POA: Diagnosis not present

## 2015-10-24 DIAGNOSIS — F322 Major depressive disorder, single episode, severe without psychotic features: Secondary | ICD-10-CM

## 2015-10-24 DIAGNOSIS — I5020 Unspecified systolic (congestive) heart failure: Secondary | ICD-10-CM | POA: Diagnosis not present

## 2015-10-24 DIAGNOSIS — R29898 Other symptoms and signs involving the musculoskeletal system: Secondary | ICD-10-CM | POA: Diagnosis not present

## 2015-10-24 DIAGNOSIS — I63412 Cerebral infarction due to embolism of left middle cerebral artery: Secondary | ICD-10-CM

## 2015-10-24 LAB — BASIC METABOLIC PANEL
ANION GAP: 7 (ref 5–15)
BUN: 24 mg/dL — AB (ref 6–20)
CALCIUM: 8.3 mg/dL — AB (ref 8.9–10.3)
CO2: 25 mmol/L (ref 22–32)
Chloride: 107 mmol/L (ref 101–111)
Creatinine, Ser: 0.45 mg/dL (ref 0.44–1.00)
GFR calc Af Amer: >60 mL/min (ref >60)
GLUCOSE: 142 mg/dL — AB (ref 65–99)
POTASSIUM: 4 mmol/L (ref 3.5–5.1)
SODIUM: 139 mmol/L (ref 135–145)

## 2015-10-24 LAB — CBC WITH DIFFERENTIAL/PLATELET
BASOS ABS: 0 10*3/uL (ref 0.0–0.1)
Basophils Relative: 0 %
EOS ABS: 0.3 10*3/uL (ref 0.0–0.7)
EOS PCT: 2 %
HCT: 27.6 % — ABNORMAL LOW (ref 36.0–46.0)
Hemoglobin: 8.5 g/dL — ABNORMAL LOW (ref 12.0–15.0)
Lymphocytes Relative: 13 %
Lymphs Abs: 1.8 10*3/uL (ref 0.7–4.0)
MCH: 25.9 pg — AB (ref 26.0–34.0)
MCHC: 30.8 g/dL (ref 30.0–36.0)
MCV: 84.1 fL (ref 78.0–100.0)
MONO ABS: 1 10*3/uL (ref 0.1–1.0)
MONOS PCT: 7 %
Neutro Abs: 11.2 10*3/uL — ABNORMAL HIGH (ref 1.7–7.7)
Neutrophils Relative %: 78 %
PLATELETS: 601 10*3/uL — AB (ref 150–400)
RBC: 3.28 MIL/uL — AB (ref 3.87–5.11)
RDW: 16.1 % — AB (ref 11.5–15.5)
WBC: 14.3 10*3/uL — ABNORMAL HIGH (ref 4.0–10.5)

## 2015-10-24 MED ORDER — METHYLPHENIDATE HCL 5 MG PO TABS
ORAL_TABLET | ORAL | Status: DC
Start: 2015-10-24 — End: 2015-11-03

## 2015-10-24 NOTE — Telephone Encounter (Signed)
[facility] 

## 2015-10-25 ENCOUNTER — Other Ambulatory Visit (HOSPITAL_COMMUNITY)
Admission: AD | Admit: 2015-10-25 | Discharge: 2015-10-25 | Disposition: A | Discharge status: Home / Self Care | Payer: Medicare HMO | Source: Skilled Nursing Facility | Attending: Internal Medicine | Admitting: Internal Medicine

## 2015-10-26 ENCOUNTER — Encounter (HOSPITAL_COMMUNITY)
Admission: AD | Admit: 2015-10-26 | Discharge: 2015-10-26 | Disposition: A | Discharge status: Home / Self Care | Payer: Medicare HMO | Source: Skilled Nursing Facility | Attending: Internal Medicine | Admitting: Internal Medicine

## 2015-10-26 ENCOUNTER — Non-Acute Institutional Stay (SKILLED_NURSING_FACILITY): Payer: Medicare HMO | Admitting: Internal Medicine

## 2015-10-26 DIAGNOSIS — R29898 Other symptoms and signs involving the musculoskeletal system: Secondary | ICD-10-CM

## 2015-10-26 LAB — PROTIME-INR
INR: 1.19 (ref 0.00–1.49)
Prothrombin Time: 15.3 seconds — ABNORMAL HIGH (ref 11.6–15.2)

## 2015-10-26 LAB — CK: CK TOTAL: 14 U/L — AB (ref 38–234)

## 2015-10-27 LAB — FECAL LACTOFERRIN, QUANT: Fecal Lactoferrin: POSITIVE

## 2015-10-28 ENCOUNTER — Encounter (HOSPITAL_COMMUNITY)
Admission: AD | Admit: 2015-10-28 | Discharge: 2015-10-28 | Disposition: A | Discharge status: Home / Self Care | Payer: Medicare HMO | Source: Skilled Nursing Facility | Attending: Internal Medicine | Admitting: Internal Medicine

## 2015-10-28 LAB — CBC
HEMATOCRIT: 27.1 % — AB (ref 36.0–46.0)
HEMOGLOBIN: 8.6 g/dL — AB (ref 12.0–15.0)
MCH: 26.2 pg (ref 26.0–34.0)
MCHC: 31.7 g/dL (ref 30.0–36.0)
MCV: 82.6 fL (ref 78.0–100.0)
Platelets: 493 10*3/uL — ABNORMAL HIGH (ref 150–400)
RBC: 3.28 MIL/uL — ABNORMAL LOW (ref 3.87–5.11)
RDW: 16.7 % — ABNORMAL HIGH (ref 11.5–15.5)
WBC: 13.4 10*3/uL — ABNORMAL HIGH (ref 4.0–10.5)

## 2015-10-28 LAB — STOOL CULTURE

## 2015-10-29 ENCOUNTER — Encounter (HOSPITAL_COMMUNITY): Payer: Self-pay

## 2015-10-29 ENCOUNTER — Inpatient Hospital Stay (HOSPITAL_COMMUNITY)
Admission: EM | Admit: 2015-10-29 | Discharge: 2015-11-02 | DRG: 291 | Disposition: A | Discharge status: Skilled Nursing Facility | Payer: Medicare HMO | Attending: Internal Medicine | Admitting: Internal Medicine

## 2015-10-29 ENCOUNTER — Non-Acute Institutional Stay: Payer: Self-pay | Admitting: Neurology

## 2015-10-29 ENCOUNTER — Encounter: Payer: Self-pay | Admitting: Neurology

## 2015-10-29 ENCOUNTER — Emergency Department (HOSPITAL_COMMUNITY): Payer: Medicare HMO

## 2015-10-29 DIAGNOSIS — R0602 Shortness of breath: Secondary | ICD-10-CM | POA: Diagnosis present

## 2015-10-29 DIAGNOSIS — R1310 Dysphagia, unspecified: Secondary | ICD-10-CM | POA: Diagnosis present

## 2015-10-29 DIAGNOSIS — Z87891 Personal history of nicotine dependence: Secondary | ICD-10-CM

## 2015-10-29 DIAGNOSIS — M810 Age-related osteoporosis without current pathological fracture: Secondary | ICD-10-CM | POA: Diagnosis present

## 2015-10-29 DIAGNOSIS — I5033 Acute on chronic diastolic (congestive) heart failure: Secondary | ICD-10-CM | POA: Insufficient documentation

## 2015-10-29 DIAGNOSIS — B9620 Unspecified Escherichia coli [E. coli] as the cause of diseases classified elsewhere: Secondary | ICD-10-CM | POA: Diagnosis present

## 2015-10-29 DIAGNOSIS — R0603 Acute respiratory distress: Secondary | ICD-10-CM | POA: Diagnosis present

## 2015-10-29 DIAGNOSIS — I5030 Unspecified diastolic (congestive) heart failure: Secondary | ICD-10-CM

## 2015-10-29 DIAGNOSIS — R0600 Dyspnea, unspecified: Secondary | ICD-10-CM | POA: Diagnosis not present

## 2015-10-29 DIAGNOSIS — J9601 Acute respiratory failure with hypoxia: Secondary | ICD-10-CM | POA: Diagnosis not present

## 2015-10-29 DIAGNOSIS — N390 Urinary tract infection, site not specified: Secondary | ICD-10-CM | POA: Diagnosis present

## 2015-10-29 DIAGNOSIS — G8191 Hemiplegia, unspecified affecting right dominant side: Secondary | ICD-10-CM | POA: Diagnosis not present

## 2015-10-29 DIAGNOSIS — R269 Unspecified abnormalities of gait and mobility: Secondary | ICD-10-CM

## 2015-10-29 DIAGNOSIS — Z85818 Personal history of malignant neoplasm of other sites of lip, oral cavity, and pharynx: Secondary | ICD-10-CM | POA: Diagnosis not present

## 2015-10-29 DIAGNOSIS — I110 Hypertensive heart disease with heart failure: Principal | ICD-10-CM | POA: Diagnosis present

## 2015-10-29 DIAGNOSIS — R748 Abnormal levels of other serum enzymes: Secondary | ICD-10-CM | POA: Diagnosis present

## 2015-10-29 DIAGNOSIS — Z931 Gastrostomy status: Secondary | ICD-10-CM | POA: Diagnosis not present

## 2015-10-29 DIAGNOSIS — Z23 Encounter for immunization: Secondary | ICD-10-CM

## 2015-10-29 DIAGNOSIS — I63412 Cerebral infarction due to embolism of left middle cerebral artery: Secondary | ICD-10-CM | POA: Diagnosis not present

## 2015-10-29 DIAGNOSIS — I69351 Hemiplegia and hemiparesis following cerebral infarction affecting right dominant side: Secondary | ICD-10-CM | POA: Diagnosis not present

## 2015-10-29 DIAGNOSIS — Z7901 Long term (current) use of anticoagulants: Secondary | ICD-10-CM | POA: Diagnosis not present

## 2015-10-29 DIAGNOSIS — I5031 Acute diastolic (congestive) heart failure: Secondary | ICD-10-CM | POA: Diagnosis not present

## 2015-10-29 DIAGNOSIS — E119 Type 2 diabetes mellitus without complications: Secondary | ICD-10-CM | POA: Diagnosis present

## 2015-10-29 DIAGNOSIS — Z7984 Long term (current) use of oral hypoglycemic drugs: Secondary | ICD-10-CM

## 2015-10-29 DIAGNOSIS — I509 Heart failure, unspecified: Secondary | ICD-10-CM

## 2015-10-29 DIAGNOSIS — Z833 Family history of diabetes mellitus: Secondary | ICD-10-CM | POA: Diagnosis not present

## 2015-10-29 DIAGNOSIS — Z809 Family history of malignant neoplasm, unspecified: Secondary | ICD-10-CM

## 2015-10-29 LAB — CBC WITH DIFFERENTIAL/PLATELET
Basophils Absolute: 0.1 10*3/uL (ref 0.0–0.1)
Basophils Relative: 0 %
Eosinophils Absolute: 0.1 10*3/uL (ref 0.0–0.7)
Eosinophils Relative: 1 %
HEMATOCRIT: 30.6 % — AB (ref 36.0–46.0)
HEMOGLOBIN: 9.5 g/dL — AB (ref 12.0–15.0)
LYMPHS ABS: 3.3 10*3/uL (ref 0.7–4.0)
Lymphocytes Relative: 15 %
MCH: 25.8 pg — AB (ref 26.0–34.0)
MCHC: 31 g/dL (ref 30.0–36.0)
MCV: 83.2 fL (ref 78.0–100.0)
MONOS PCT: 4 %
Monocytes Absolute: 0.9 10*3/uL (ref 0.1–1.0)
NEUTROS ABS: 18.3 10*3/uL — AB (ref 1.7–7.7)
NEUTROS PCT: 80 %
Platelets: 670 10*3/uL — ABNORMAL HIGH (ref 150–400)
RBC: 3.68 MIL/uL — ABNORMAL LOW (ref 3.87–5.11)
RDW: 16.7 % — ABNORMAL HIGH (ref 11.5–15.5)
WBC: 22.7 10*3/uL — ABNORMAL HIGH (ref 4.0–10.5)

## 2015-10-29 LAB — BLOOD GAS, ARTERIAL
Acid-Base Excess: 0.7 mmol/L (ref 0.0–2.0)
BICARBONATE: 24 meq/L (ref 20.0–24.0)
DRAWN BY: 22223
Delivery systems: POSITIVE
EXPIRATORY PAP: 5
FIO2: 100
Inspiratory PAP: 12
O2 SAT: 97.5 %
PATIENT TEMPERATURE: 37
PCO2 ART: 34.9 mmHg — AB (ref 35.0–45.0)
PO2 ART: 105 mmHg — AB (ref 80.0–100.0)
TCO2: 12 mmol/L (ref 0–100)
pH, Arterial: 7.434 (ref 7.350–7.450)

## 2015-10-29 LAB — COMPREHENSIVE METABOLIC PANEL
ALK PHOS: 62 U/L (ref 38–126)
ALT: 26 U/L (ref 14–54)
ANION GAP: 10 (ref 5–15)
AST: 25 U/L (ref 15–41)
Albumin: 2.1 g/dL — ABNORMAL LOW (ref 3.5–5.0)
BILIRUBIN TOTAL: 0.2 mg/dL — AB (ref 0.3–1.2)
BUN: 33 mg/dL — ABNORMAL HIGH (ref 6–20)
CALCIUM: 8.9 mg/dL (ref 8.9–10.3)
CO2: 24 mmol/L (ref 22–32)
Chloride: 105 mmol/L (ref 101–111)
Creatinine, Ser: 0.48 mg/dL (ref 0.44–1.00)
GFR calc non Af Amer: >60 mL/min (ref >60)
Glucose, Bld: 185 mg/dL — ABNORMAL HIGH (ref 65–99)
Potassium: 4.9 mmol/L (ref 3.5–5.1)
Sodium: 139 mmol/L (ref 135–145)
TOTAL PROTEIN: 6.1 g/dL — AB (ref 6.5–8.1)

## 2015-10-29 LAB — BRAIN NATRIURETIC PEPTIDE

## 2015-10-29 LAB — PROTIME-INR
INR: 1.23 (ref 0.00–1.49)
Prothrombin Time: 15.7 seconds — ABNORMAL HIGH (ref 11.6–15.2)

## 2015-10-29 LAB — I-STAT CG4 LACTIC ACID, ED: Lactic Acid, Venous: 2.46 mmol/L (ref 0.5–2.0)

## 2015-10-29 LAB — TROPONIN I: Troponin I: 0.1 ng/mL — ABNORMAL HIGH (ref <0.031)

## 2015-10-29 LAB — APTT: APTT: 34 s (ref 24–37)

## 2015-10-29 MED ORDER — ONDANSETRON HCL 4 MG PO TABS: 4.0000 mg | ORAL_TABLET | Freq: Four times a day (QID) | ORAL | Status: DC | PRN

## 2015-10-29 MED ORDER — ALBUTEROL SULFATE (2.5 MG/3ML) 0.083% IN NEBU
2.5000 mg | INHALATION_SOLUTION | RESPIRATORY_TRACT | Status: DC
  Administered 2015-10-29 – 2015-10-30 (×2): 2.5 mg via RESPIRATORY_TRACT
  Filled 2015-10-29 (×2): qty 3

## 2015-10-29 MED ORDER — LEVETIRACETAM 100 MG/ML PO SOLN
750.0000 mg | Freq: Two times a day (BID) | ORAL | Status: DC
  Filled 2015-10-29 (×4): qty 7.5

## 2015-10-29 MED ORDER — CHLORHEXIDINE GLUCONATE 0.12 % MT SOLN
15.0000 mL | Freq: Two times a day (BID) | OROMUCOSAL | Status: DC
  Administered 2015-10-30 – 2015-11-02 (×8): 15 mL via OROMUCOSAL
  Filled 2015-10-29 (×7): qty 15

## 2015-10-29 MED ORDER — FUROSEMIDE 10 MG/ML IJ SOLN
40.0000 mg | INTRAMUSCULAR | Status: AC
  Administered 2015-10-29: 40 mg via INTRAVENOUS
  Filled 2015-10-29: qty 4

## 2015-10-29 MED ORDER — ROCURONIUM BROMIDE 50 MG/5ML IV SOLN
INTRAVENOUS | Status: AC
  Filled 2015-10-29: qty 2

## 2015-10-29 MED ORDER — CETYLPYRIDINIUM CHLORIDE 0.05 % MT LIQD
7.0000 mL | Freq: Two times a day (BID) | OROMUCOSAL | Status: DC
  Administered 2015-10-30 – 2015-11-02 (×8): 7 mL via OROMUCOSAL

## 2015-10-29 MED ORDER — PRAVASTATIN SODIUM 40 MG PO TABS
40.0000 mg | ORAL_TABLET | Freq: Every evening | ORAL | Status: DC
  Administered 2015-10-30 – 2015-11-01 (×3): 40 mg
  Filled 2015-10-29 (×3): qty 1

## 2015-10-29 MED ORDER — ALBUTEROL (5 MG/ML) CONTINUOUS INHALATION SOLN
10.0000 mg/h | INHALATION_SOLUTION | RESPIRATORY_TRACT | Status: DC
  Administered 2015-10-29: 10 mg/h via RESPIRATORY_TRACT
  Filled 2015-10-29: qty 20

## 2015-10-29 MED ORDER — FLUOXETINE HCL 10 MG PO CAPS
10.0000 mg | ORAL_CAPSULE | Freq: Every day | ORAL | Status: DC
  Administered 2015-10-30 – 2015-11-02 (×4): 10 mg
  Filled 2015-10-29 (×7): qty 1

## 2015-10-29 MED ORDER — ETOMIDATE 2 MG/ML IV SOLN
INTRAVENOUS | Status: AC
  Filled 2015-10-29: qty 20

## 2015-10-29 MED ORDER — ONDANSETRON HCL 4 MG/2ML IJ SOLN: 4.0000 mg | Freq: Three times a day (TID) | INTRAMUSCULAR | Status: DC | PRN

## 2015-10-29 MED ORDER — SUCCINYLCHOLINE CHLORIDE 20 MG/ML IJ SOLN
INTRAMUSCULAR | Status: AC
  Filled 2015-10-29: qty 1

## 2015-10-29 MED ORDER — BUMETANIDE 0.25 MG/ML IJ SOLN
0.5000 mg | Freq: Two times a day (BID) | INTRAMUSCULAR | Status: DC
  Administered 2015-10-30: 0.5 mg via INTRAVENOUS
  Filled 2015-10-29: qty 4

## 2015-10-29 MED ORDER — ASPIRIN EC 81 MG PO TBEC
81.0000 mg | DELAYED_RELEASE_TABLET | Freq: Every day | ORAL | Status: DC
  Administered 2015-10-30 – 2015-11-02 (×3): 81 mg via ORAL
  Filled 2015-10-29 (×3): qty 1

## 2015-10-29 MED ORDER — NYSTATIN 100000 UNIT/GM EX POWD
Freq: Three times a day (TID) | CUTANEOUS | Status: DC
  Administered 2015-10-30 – 2015-10-31 (×7): via TOPICAL
  Administered 2015-11-01: 1 via TOPICAL
  Administered 2015-11-01: 21:00:00 via TOPICAL
  Administered 2015-11-01 – 2015-11-02 (×3): 1 via TOPICAL
  Filled 2015-10-29 (×2): qty 15

## 2015-10-29 MED ORDER — ASPIRIN 81 MG PO CHEW
324.0000 mg | CHEWABLE_TABLET | Freq: Once | ORAL | Status: AC
  Administered 2015-10-29: 324 mg via ORAL
  Filled 2015-10-29: qty 4

## 2015-10-29 MED ORDER — SODIUM CHLORIDE 0.9 % IJ SOLN
3.0000 mL | Freq: Two times a day (BID) | INTRAMUSCULAR | Status: DC
  Administered 2015-10-30 – 2015-11-02 (×8): 3 mL via INTRAVENOUS

## 2015-10-29 MED ORDER — LIDOCAINE HCL (CARDIAC) 20 MG/ML IV SOLN
INTRAVENOUS | Status: AC
  Filled 2015-10-29: qty 5

## 2015-10-29 MED ORDER — ONDANSETRON HCL 4 MG/2ML IJ SOLN: 4.0000 mg | Freq: Four times a day (QID) | INTRAMUSCULAR | Status: DC | PRN

## 2015-10-29 MED ORDER — FREE WATER
200.0000 mL | Freq: Three times a day (TID) | Status: DC
Start: 2015-10-29 — End: 2015-11-02
  Administered 2015-10-30 – 2015-11-02 (×11): 200 mL

## 2015-10-29 MED ORDER — LEVALBUTEROL HCL 0.63 MG/3ML IN NEBU: 0.6300 mg | INHALATION_SOLUTION | Freq: Four times a day (QID) | RESPIRATORY_TRACT | Status: DC | PRN

## 2015-10-29 MED ORDER — IPRATROPIUM-ALBUTEROL 0.5-2.5 (3) MG/3ML IN SOLN: 3.0000 mL | Freq: Four times a day (QID) | RESPIRATORY_TRACT | Status: DC | PRN

## 2015-10-29 MED ORDER — METHYLPREDNISOLONE SODIUM SUCC 125 MG IJ SOLR
125.0000 mg | Freq: Once | INTRAMUSCULAR | Status: AC
  Administered 2015-10-29: 125 mg via INTRAVENOUS
  Filled 2015-10-29: qty 2

## 2015-10-29 MED ORDER — LEVETIRACETAM 100 MG/ML PO SOLN
1500.0000 mg | Freq: Every day | ORAL | Status: DC
  Administered 2015-10-30 – 2015-11-01 (×4): 1500 mg via ORAL
  Filled 2015-10-29 (×6): qty 15

## 2015-10-29 MED ORDER — SENNOSIDES-DOCUSATE SODIUM 8.6-50 MG PO TABS
2.0000 | ORAL_TABLET | Freq: Two times a day (BID) | ORAL | Status: DC
  Administered 2015-10-30 – 2015-11-02 (×5): 2 via ORAL
  Filled 2015-10-29 (×5): qty 2

## 2015-10-29 MED ORDER — LEVETIRACETAM 100 MG/ML PO SOLN
750.0000 mg | Freq: Every morning | ORAL | Status: DC
  Administered 2015-10-31 – 2015-11-02 (×3): 750 mg
  Filled 2015-10-29 (×6): qty 7.5

## 2015-10-29 MED ORDER — IPRATROPIUM BROMIDE 0.02 % IN SOLN
0.5000 mg | RESPIRATORY_TRACT | Status: DC
  Administered 2015-10-29: 0.5 mg via RESPIRATORY_TRACT
  Filled 2015-10-29: qty 2.5

## 2015-10-29 MED ORDER — INFLUENZA VAC SPLIT QUAD 0.5 ML IM SUSY
0.5000 mL | PREFILLED_SYRINGE | INTRAMUSCULAR | Status: AC
  Administered 2015-10-30: 0.5 mL via INTRAMUSCULAR
  Filled 2015-10-29 (×2): qty 0.5

## 2015-10-29 NOTE — H&P (Signed)
[facility] History and Physical  Laura Melendez [id number] DOB: [birth year]    PCP:   [name], MD   Chief Complaint:  Respiratory distress.   HPI: Laura Melendez is an 74 y.o. female with multiple medical problem including embolic CVA with right hemiparesis, on anticoauglatuion with coumadin, hx of parotid cancer s/p excision, complicated with respiratory failure requiring intubation and pneumothorax requriing chest tube, hx of DM, HTN, PEG tube placement, diastolic CHF with EF [id number], recent admission for sepsis, brought to the ER as she was having respiratory distress.  Evaluation included a CXR showing vascular congestion, with no infiltrate, BNP greater than 4500, and troponin was elevated at 0.1.  Her EKG showed ST with no acute ST T changes, and her WBC was elevated to 22K, with Hb of 18 g per dL, Her Cr was 0.48.  She was placed on Bipap, and given IV Lasix of 40mg , and improved.  Hospitalist was asked to admit her for respiratory failure, likely from diastolic CHF.   When I see her, on Bipap, she was in no distress, alert, and follow simple verbal command.   Rewiew of Systems: Unable.   Past Medical History  Diagnosis Date  . Essential hypertension, benign   . Type 2 diabetes mellitus ([place])   . Osteoporosis 06/2008  . Stroke [facility]) 03/2015    right hemiparesis  . History of parotid cancer     Past Surgical History  Procedure Laterality Date  . Ankle surgery Left 2004    otif  . Abdominal hysterectomy    . Anterior and posterior repair N/A 03/11/2013    Procedure: ANTERIOR (CYSTOCELE) AND POSTERIOR REPAIR (RECTOCELE);  Surgeon: Linda Hedges, DO;  Location: [facility];  Service: Gynecology;  Laterality: N/A;  with sacrospinous ligament fixation bilateral  . Colonoscopy  2009    negative  . Cataract extraction w/phaco Right 12/27/2014    Procedure: CATARACT EXTRACTION PHACO AND INTRAOCULAR LENS PLACEMENT RIGHT EYE;  Surgeon: Tonny Branch, MD;  Location: [facility];  Service:  Ophthalmology;  Laterality: Right;  CDE:10.63  . Cataract extraction w/phaco Left 01/13/2015    Procedure: CATARACT EXTRACTION PHACO AND INTRAOCULAR LENS PLACEMENT LEFT EYE;  Surgeon: Tonny Branch, MD;  Location: [facility];  Service: Ophthalmology;  Laterality: Left;  CDE:18.87  . Cholecystectomy    . Tee without cardioversion N/A 04/01/2015    Procedure: TRANSESOPHAGEAL ECHOCARDIOGRAM (TEE);  Surgeon: Sanda Klein, MD;  Location: [facility] ENDOSCOPY;  Service: Cardiovascular;  Laterality: N/A;  . Peg placement    . Parotidectomy      Medications:  HOME MEDS: Prior to Admission medications   Medication Sig Start Date End Date Taking? Authorizing Provider  clotrimazole-betamethasone (LOTRISONE) cream Apply 1 application topically daily as needed (APPLY A THIN LAYER AS BARRIER AFTER CLEANING AND DRYING BUTTOCKS AREA COMPLETELY).   Yes Historical Provider, MD  enoxaparin (LOVENOX) 40 MG/0.4ML injection Inject 0.4 mLs (40 mg total) into the skin daily. 09/08/15  Yes [name], MD  FLUoxetine (PROZAC) 10 MG capsule TAKE 1 CAPSULE BY MOUTH DAILY FOR DEPRESSION/ANXIETY Patient taking differently: TAKE 1 CAPSULE VIA PEG DAILY FOR DEPRESSION/ANXIETY 09/15/15  Yes [name], MD  furosemide (LASIX) 20 MG tablet 20 mg by PEG Tube route daily.   Yes Historical Provider, MD  levETIRAcetam (KEPPRA) 750 MG tablet TAKE 1 TABLET BY MOUTH EVERY MORNING AND 2 TABLETS BY MOUTH EVERY NIGHT AT BEDTIME Patient taking differently: TAKE 1 TABLET VIA PEG EVERY MORNING AND 2 TABLETS BY MOUTH EVERY  NIGHT AT BEDTIME 08/01/15  Yes [name], MD  loperamide (IMODIUM) 2 MG capsule Take 1 capsule (2 mg total) by mouth every 4 (four) hours as needed for diarrhea or loose stools. 10/21/15  Yes [name], MD  metFORMIN (GLUCOPHAGE) 500 MG tablet Take one half tablet BID Patient taking differently: 250 mg by PEG Tube route 2 (two) times daily with a meal.  04/25/15  Yes [name], MD  methylphenidate (RITALIN) 5 MG tablet Take  one tablet per tube twice daily with breakfast and lunch Patient taking differently: 5 mg by PEG Tube route 2 (two) times daily with breakfast and lunch.  10/24/15  Yes [name], DO  metoCLOPramide (REGLAN) 10 MG tablet Place 1 tablet (10 mg total) into feeding tube every 6 (six) hours as needed for nausea. 10/21/15  Yes [name], MD  metoprolol tartrate (LOPRESSOR) 25 MG tablet Take 25 mg by mouth 2 (two) times daily.   Yes Historical Provider, MD  nystatin (MYCOSTATIN/NYSTOP) 100000 UNIT/GM POWD Apply topically 3 (three) times daily. **APPLY A DUSTING TO RASH ON PERINEUM AND BUTTOCKS AT [facility] SHIFT   Yes Historical Provider, MD  ondansetron (ZOFRAN ODT) 4 MG disintegrating tablet Take 1 tablet (4 mg total) by mouth every 8 (eight) hours as needed for nausea or vomiting. Patient taking differently: 4 mg by PEG Tube route every 8 (eight) hours as needed for nausea or vomiting.  10/08/15  Yes [name], MD  pravastatin (PRAVACHOL) 40 MG tablet Take 1 tablet (40 mg total) by mouth daily. Patient taking differently: 40 mg by PEG Tube route every evening.  07/20/15  Yes [name], MD  sodium chloride 1 G tablet 1 g by PEG Tube route 2 (two) times daily with a meal.    Yes Historical Provider, MD  warfarin (COUMADIN) 3 MG tablet 3 mg by PEG Tube route daily.   Yes Historical Provider, MD  Water For Irrigation, Sterile (FREE WATER) SOLN Place 200 mLs into feeding tube every 8 (eight) hours. 10/21/15  Yes [name], MD  amoxicillin-clavulanate (AUGMENTIN) 200-28.5 MG/5ML suspension Place 21.9 mLs (875 mg total) into feeding tube every 12 (twelve) hours. For 2 more days Patient not taking: Reported on 10/29/2015 10/21/15   [name], MD  NIFEdipine (PROCARDIA XL/ADALAT-CC) 60 MG 24 hr tablet Take 1 tablet (60 mg total) by mouth daily. DO NOT CRUSH Patient not taking: Reported on 10/29/2015 07/12/15   [name], MD  Nutritional Supplements (FEEDING SUPPLEMENT, VITAL AF 1.2 CAL,)  LIQD Place 1,000 mLs into feeding tube daily. Patient not taking: Reported on 10/29/2015 10/21/15   [name], MD  raloxifene (EVISTA) 60 MG tablet Take 1 tablet (60 mg total) by mouth daily. Patient not taking: Reported on 10/17/2015 07/12/15   [name], MD  senna-docusate (SENOKOT-S) 8.6-50 MG per tablet Take 2 tablets by mouth 2 (two) times daily. For constipation Patient not taking: Reported on 10/29/2015 04/22/15   Ivan Anchors Love, PA-C  warfarin (COUMADIN) 2 MG tablet Take 1/2 a pill on Friday evening and take a whole pill on all other days. Patient not taking: Reported on 10/29/2015 07/12/15   [name], MD     Allergies:  No Known Allergies  Social History:   reports that she quit smoking about 8 months ago. Her smoking use included Cigarettes. She has a 7.5 pack-year smoking history. She does not have any smokeless tobacco history on file. She reports that she does not drink alcohol  or use illicit drugs.  Family History: Family History  Problem Relation Age of Onset  . Cancer Father     Liver  . Cancer Sister     Breast  . Diabetes Brother   . Dementia Mother      Physical Exam: Filed Vitals:   10/29/15 2043 10/29/15 2044 10/29/15 2059 10/29/15 2239  BP:   154/91 113/64  Pulse:  115 114 113  Temp:   99.7 F (37.6 C)   TempSrc:   Oral   Resp:  28 32 24  SpO2: 100% 100% 98% 97%   Blood pressure 113/64, pulse 113, temperature 99.7 F (37.6 C), temperature source Oral, resp. rate 24, SpO2 97 %.  GEN:  Pleasant  patient lying in the stretcher in no acute distress; cooperative with exam. PSYCH:  alert and oriented x4; does not appear anxious or depressed; affect is appropriate. HEENT: Mucous membranes pink and anicteric; PERRLA; EOM intact; no cervical lymphadenopathy nor thyromegaly or carotid bruit; no JVD; There were no stridor. Neck is very supple. Breasts:: Not examined CHEST WALL: No tenderness CHEST: On bipap, no wheezing at this time.  Some rales.   HEART: Regular rate and rhythm.  There are no murmur, rub, or gallops.   BACK: No kyphosis or scoliosis; no CVA tenderness ABDOMEN: soft and non-tender; no masses, no organomegaly, normal abdominal bowel sounds; no pannus; no intertriginous candida. There is no rebound and no distention. Rectal Exam: Not done EXTREMITIES: No bone or joint deformity; age-appropriate arthropathy of the hands and knees; no edema; no ulcerations.  There is no calf tenderness. Genitalia: not examined PULSES: 2+ and symmetric SKIN: Normal hydration no rash or ulceration CNS: Cranial nerves 2-12 grossly intact no focal lateralizing neurologic deficit.  Speech is fluent; uvula elevated with phonation, facial symmetry and tongue midline. DTR are normal bilaterally, cerebella exam is intact, barbinski is negative and strengths are equaled bilaterally.  No sensory loss. Her lower ext is weak.   Labs on Admission:  Basic Metabolic Panel:  Recent Labs Lab 10/24/15 0630 10/29/15 2040  NA 139 139  K 4.0 4.9  CL 107 105  CO2 25 24  GLUCOSE 142* 185*  BUN 24* 33*  CREATININE 0.45 0.48  CALCIUM 8.3* 8.9    Recent Labs Lab 10/29/15 2040  AST 25  ALT 26  ALKPHOS 62  BILITOT 0.2*  PROT 6.1*  ALBUMIN 2.1*   CBC:  Recent Labs Lab 10/24/15 0630 10/28/15 0715 10/29/15 2040  WBC 14.3* 13.4* 22.7*  NEUTROABS 11.2*  --  18.3*  HGB 8.5* 8.6* 9.5*  HCT 27.6* 27.1* 30.6*  MCV 84.1 82.6 83.2  PLT 601* 493* 670*   Cardiac Enzymes:  Recent Labs Lab 10/26/15 0825 10/29/15 2040  CKTOTAL 14*  --   TROPONINI  --  0.10*   Radiological Exams on Admission: Dg Chest Portable 1 View  10/29/2015  CLINICAL DATA:  Respiratory distress EXAM: PORTABLE CHEST - 1 VIEW COMPARISON:  10/17/2015 FINDINGS: Cardiac shadow is stable. Lungs are well aerated bilaterally. Mild vascular congestion is noted as well as bibasilar increased density likely related atelectasis or early infiltrate. No bony abnormality is seen.  IMPRESSION: Increasing bibasilar densities and mild vascular congestion. Electronically Signed   By: Inez Catalina M.D.   On: 10/29/2015 20:52    EKG: Independently reviewed.    Assessment/Plan Present on Admission:  . Acute respiratory failure with hypoxia ([place]) . 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 Hx of CVA. Right  hemiplegia. Elevated troponins.   PLAN:  Diastolic CHF:  Will continue with Bipap, give IV Bumex 0.5mg  IV Q 12, check I/O and daily weight.  She is improving.  Acute respiratory failure:  Suspect CHF, so will not continue steroids.  Will give Xopenex as required.  Admit to ICU for Bipap.  Elevated troponin:  Give ASA with Coumadin for now.  Continue with cycling troponins.  Suspect demand ischemia.  CVA:  Hold TF while on Bipap for now.  She has been on Jevity 1.2 at 42 cc continuous per hour, when resume TF.  Embolic CVA:  WIll continue with Coumadin per pharmacy dosing.  Hx of Seizure:  Will continue with Keppra per PEG tube.    Other plans as per orders.  Code Status: FULL Haskel Khan, MD. [facility] Pager [phone] 7pm to 7am.  10/29/2015, 10:42 PM

## 2015-10-29 NOTE — Progress Notes (Addendum)
Patient ID: Laura Melendez, female   DOB: 1941-08-15, 74 y.o.   MRN: [id number]                PROGRESS NOTE  DATE:  10/24/2015          FACILITY: [facility]                    LEVEL OF CARE:   SNF   Acute Visit                 CHIEF COMPLAINT:  Review of continuing functional decline, weakness, dysphagia.    HISTORY OF PRESENT ILLNESS:  This patient had a very extensive review today and a very protracted discussion with Nursing, Speech Therapy, and the patient's husband.    In brief review of her history, she is a patient who went into [facility] for an elective left parotidectomy on 09/19/2015.  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  On postoperative day #1 on 09/20/2015, she developed a hematoma and she had to go back for surgical incision and evacuation of this on 09/20/2015.  Postoperatively after this, she went into respiratory failure and required intubation.   It was felt that she had reactive airway swelling in the setting of the hematoma and postoperative complications.   She developed a right pneumothorax, pneumonia secondary to E.coli, Klebsiella for which she was treated.  On 09/22/2015, she went into congestive heart failure and required IV Lasix.  After this, she developed swallowing problems and failed numerous swallowing evaluations, ultimately ending up with a PEG tube on 10/03/2015.  Before she left [facility], she was cleared for a pureed diet based on a FEES study.  She was restarted on her home Coumadin.    Since her arrival here, things have not gone particularly well.  She was very weak here and noncommunicative during her first stay here.  Nevertheless, she did have antigravity strength in her arms and I felt she had normal reflexes, normal Babinski and Hoffman's reflex.  I felt she had normal extraocular movements including #s 3, 4, and 6.  Cranial nerves #7, 10, and 11 seemed intact.   She has never had a gag reflex and she does not seem to  be able to protrude her tongue.  An MRI I did before the most recent hospitalization was negative for a CVA especially a brainstem cva which is what I was expecting.   Complicating all of this, I have always felt that this woman had severe depression and minimal effort.    She was hospitalized on  10/17/2015 for acute respiratory failure secondary to aspiration pneumonia.  She recently returned to [place].  She has deteriorated.  She now no longer has antigravity strength in her arms, legs.  She has lateral gaze, but no vertical gaze.  I cannot get her to protrude her tongue.  She has absolutely no gag reflex.  Again, her neurologic exam is complicated by very little reproducible effort.     Past Medical History  Diagnosis Date  . Essential hypertension, benign   . Type 2 diabetes mellitus ([place])   . Osteoporosis 06/2008  . Stroke [facility]) 03/2015    right hemiparesis  . History of parotid cancer                  Past Surgical History  Procedure Laterality Date  . Ankle surgery Left 2004    otif  . Abdominal hysterectomy    .  Anterior and posterior repair N/A 03/11/2013    Procedure: ANTERIOR (CYSTOCELE) AND POSTERIOR REPAIR (RECTOCELE);  Surgeon: Linda Hedges, DO;  Location: [facility];  Service: Gynecology;  Laterality: N/A;  with sacrospinous ligament fixation bilateral  . Colonoscopy  2009    negative  . Cataract extraction w/phaco Right 12/27/2014    Procedure: CATARACT EXTRACTION PHACO AND INTRAOCULAR LENS PLACEMENT RIGHT EYE;  Surgeon: Tonny Branch, MD;  Location: [facility];  Service: Ophthalmology;  Laterality: Right;  CDE:10.63  . Cataract extraction w/phaco Left 01/13/2015    Procedure: CATARACT EXTRACTION PHACO AND INTRAOCULAR LENS PLACEMENT LEFT EYE;  Surgeon: Tonny Branch, MD;  Location: [facility];  Service: Ophthalmology;  Laterality: Left;  CDE:18.87  . Cholecystectomy    . Tee without cardioversion N/A 04/01/2015    Procedure: TRANSESOPHAGEAL ECHOCARDIOGRAM (TEE);  Surgeon: Sanda Klein, MD;   Location: [facility] ENDOSCOPY;  Service: Cardiovascular;  Laterality: N/A;  . Peg placement    . Parotidectomy     CURRENT MEDICATIONS:  Medication list is reviewed.    REVIEW OF SYSTEMS:   This is not really possible.    PHYSICAL EXAMINATION:   VITAL SIGNS:     PULSE:  97.   RESPIRATIONS:  18 and unlabored.   02 SATURATIONS:  95%.   GENERAL APPEARANCE:  The patient is awake and can focus for a brief period of time.     CHEST/RESPIRATORY:  Shallow, but clear air entry.       CARDIOVASCULAR:   CARDIAC:  Heart sounds are normal.  She appears to be euvolemic.  There is a mid systolic murmur that sounds benign.   GASTROINTESTINAL:   ABDOMEN:  Not as tender as yesterday.  Bowel sounds are positive.   GENITOURINARY:   BLADDER:  Foley catheter in place.   SKIN:   INSPECTION:  She has a mycotic rash over her buttocks.      NEUROLOGICAL:    CRANIAL NERVES:  She has horizontal gaze.  She does not have vertical gaze.  She has no gag reflex, although she appears to have sensation.  She cannot protrude her tongue.  She can open her mouth and I think cranial nerve #5 is intact.  There is some facial asymmetry with weakness on the right.  This may be from her previous stroke.   SENSATION/STRENGTH:  She can grip my hand slightly, but does not appear to have antigravity strength in her arms.  She has never had antigravity strength in her legs.   PSYCHIATRIC:   MENTAL STATUS:  Again, very depressed, very inattentive.    ASSESSMENT/PLAN:          Dysphagia, other generalized muscle weakness including cranial nerves and now arms and legs. Her MRI that I did on 10/13/2015 showed a remote lacunar infarct in the left centrum semiovale, remote thalamic infarcts, a remote left cerebellar infarct.  Brain stem demonstrated mild T2 changes.  There was no acute abnormality, certainly nothing to support neurologic deterioration to this degree.  I have found myself wondering about a polyradiculopathy/Guillain-Barre type  variant.  What is against this is normal reflexes.   I do not believe this is myasthenia.  I am going to see if I can get her in to see a neurologist.    PEG tube dependency.   Unfortunately, I cannot exactly see this changing.  Her husband had brought up something over the weekend about stopping this.  There was apparently some discussion about family members.  She is now back on this.  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  One would wonder whether this could have something to do with her abdominal pain (emboli to her gut).  An MRI/MRA would seem to be the way to go in investigating this.  However, I do not know that this would have any tangential benefit to the patient.  She is certainly not a surgical candidate.  She is already on Lovenox and Coumadin.    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    Aspiration pneumonia, recurrent vomiting.  This seems better after they changed her to a purely elemental formula (Vital).    I am going to screen this lady for myopathy, myasthenia.  Her MRI did not support a brain stem stroke.  I will see about getting a Neurology consult.    I have initiated end-of-life discussion with her husband.  There are three children.  I gather there is not uniform agreement about things here.  I will at least try to get this lady a DNR.

## 2015-10-29 NOTE — ED Notes (Signed)
Patient sent from the [facility] with shortness of breath that started this morning. O2 sat was 89% when EMS arrived. Placed on NRB and it went 99%.

## 2015-10-29 NOTE — Progress Notes (Addendum)
[place] A. Merlene Laughter, MD     [email or web address]          Laura Melendez is an 74 y.o. female.   ASSESSMENT/PLAN: Subacute severe dysarthria and dysphagia along with global weakness. There is no evidence of neuromuscular disorders including myasthenia gravis, Guillain-Barr syndrome, chronic inflammatory polyradiculopathy neuritis, chronic neuropathy or myopathy. The most likely scenario is that the patient has had multiple injury to the brain including repeat infarcts, severe chronic microscopic ischemic changes and the hypoxia/anoxia related to her recent hospitalization at [facility]. The effect of these injuries are cumulative and unfortunately has created the patient's current state. 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 Consequently, I recommend that the metoclopramide/Reglan be discontinued.        The patient is a 74 year old white female who was in her usual state of health until April 2016. She developed right-sided weakness and was diagnosed as having on stroke involving the contralateral deep white matter region. These were punctate lesion seen on diffusion imaging. The husband is at the bedside and provides very good history. The patient apparently was left with residual right hemiparesis and the dysarthria along with some dysphagia. She was able to ambulate with assistance typically by holding onto the husband. He reports that her dysarthria and dysphagia improved. The patient went to [facility] to have right parathyroid surgery. The surgery was complicated by several issues including local hematoma requiring the patient to have another surgery to evacuate hematoma. It appears that she became acutely dyspneic with the respiratory failure requiring urgent intubation during the code. The husband reports that after it each of these events she developed  markedly worse dysarthria, dysphagia and global weakness. Furthermore, it appears her course was, created by pneumonia and the right pneumothorax. He reports that she's had some fluctuation in her symptoms since been discharged. On some days, she is good enough and can talk although this is very difficult to understand even at her best after her events. At other times she is quite drowsy. He tells me that she did have an evaluation in [place] where she had tests and the was placed on Keppra because of possible seizures. The dose was increased. This is after her last stroke this year.   GENERAL: Pleasant female in no acute distress. There is mild global psychomotor slowing and the bradykinesia.  HEENT: Supple. Atraumatic normocephalic. No weakness of the obicularis oculi muscles. No tongue atrophy.  ABDOMEN: soft  EXTREMITIES: 2+ pitting edema of the upper extremities.   BACK: Normal.  SKIN: Normal by inspection.    MENTAL STATUS: The patient is awake and attentive. She has severe dysarthria and evidence of hypophonia. She does speak in 2 word sentences that is comprehendible. She does follow commands.  CRANIAL NERVES: Pupils are equal, round and reactive to light and accommodation; extra ocular movements are full, there is no significant nystagmus; visual fields are full; upper and lower facial muscles are normal in strength and symmetric, there is mild flattening of the Left nasolabial fold; tongue is midline; uvula is midline; shoulder elevation is normal.   MOTOR: Right upper extremity pronator drift. Right upper extremity strength 3/5. Left upper extremity strength 4 minus/5. Hip flexion bilaterally 2/5 and dorsiflexion 3/5.  COORDINATION: No tremors or dysmetria is noted. No significant cogwheeling observed.  REFLEXES: Deep tendon reflexes are symmetrical and normal. Babinski reflexes are flexor bilaterally.   SENSATION: Normal to light touch.  BRAIN MRI/MRA 4-206 There are  several punctate acute infarcts in the high, medial left frontal lobe which are predominantly subcortical in location. There is no evidence of intracranial hemorrhage, mass, midline shift, or extra-axial fluid collection. There is mild generalized cerebral atrophy. Patchy and confluent T2 hyperintensities in the periventricular greater than subcortical cerebral white matter have increased from the prior MRI and are nonspecific but compatible with moderate chronic small vessel ischemic disease. Chronic lacunar infarcts are noted in the left corona radiata and right greater than left thalami. A small, chronic left cerebellar infarct is also noted.   Prior bilateral cataract extraction is noted. Paranasal sinuses mastoid air cells are clear. Major intracranial vascular flow voids are preserved.   MRA HEAD FINDINGS   Images are mildly to moderately motion degraded. Visualized distal vertebral arteries are patent and codominant without stenosis. Right PICA origin is patent. Left PICA is not identified. AICA and SCA origins are patent. Basilar artery is patent without stenosis. There is a fetal origin of the left PCA. There is moderate right P1 segment stenosis.   Internal carotid arteries are patent from skullbase to carotid termini without evidence of significant stenosis. M1 and A1 segments are patent without stenosis. A2 segments and MCA branch vessels are suboptimally evaluated due to motion artifact but appear patent without significant proximal stenosis identified. No intracranial aneurysm is identified.   IMPRESSION: 1. Punctate acute left frontal lobe infarcts. 2. Moderate chronic small vessel ischemic disease and chronic lacunar infarcts as above. 3. No major intracranial arterial occlusion or significant proximal anterior circulation stenosis. Moderate proximal right PCA stenosis.  BRAIN MRI 10-13-15 1. No acute intracranial abnormality or significant interval change. 2.  Stable atrophy and diffuse white matter disease. 3. Remote lacunar infarcts in the thalami bilaterally, worse on the right, and the left cerebellum.    The brain MRI from October 2016 is reviewed in person. There is nothing acute seen on diffusion imaging. There is moderate global atrophy. 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 There is chronic lacunar infarcts involving the thalami bilaterally, left inferior cerebellar region and the left basal ganglia. There is a tiny reduced signal seen involving the left thalamic region indicating chronic microhemorrhage.      There were no vitals taken for this visit.  Past Medical History  Diagnosis Date  . Essential hypertension, benign   . Type 2 diabetes mellitus ([place])   . Osteoporosis 06/2008  . Stroke [facility]) 03/2015    right hemiparesis  . History of parotid cancer     Past Surgical History  Procedure Laterality Date  . Ankle surgery Left 2004    otif  . Abdominal hysterectomy    . Anterior and posterior repair N/A 03/11/2013    Procedure: ANTERIOR (CYSTOCELE) AND POSTERIOR REPAIR (RECTOCELE);  Surgeon: Linda Hedges, DO;  Location: [facility];  Service: Gynecology;  Laterality: N/A;  with sacrospinous ligament fixation bilateral  . Colonoscopy  2009    negative  . Cataract extraction w/phaco Right 12/27/2014    Procedure: CATARACT EXTRACTION PHACO AND INTRAOCULAR LENS PLACEMENT RIGHT EYE;  Surgeon: Tonny Branch, MD;  Location: [facility];  Service: Ophthalmology;  Laterality: Right;  CDE:10.63  . Cataract extraction w/phaco Left 01/13/2015    Procedure: CATARACT EXTRACTION PHACO AND INTRAOCULAR LENS PLACEMENT LEFT EYE;  Surgeon: Tonny Branch, MD;  Location: [facility];  Service: Ophthalmology;  Laterality: Left;  CDE:18.87  . Cholecystectomy    . Tee without cardioversion N/A 04/01/2015  Procedure: TRANSESOPHAGEAL ECHOCARDIOGRAM (TEE);  Surgeon: Sanda Klein, MD;  Location: [facility] ENDOSCOPY;  Service:  Cardiovascular;  Laterality: N/A;  . Peg placement    . Parotidectomy      Family History  Problem Relation Age of Onset  . Cancer Father     Liver  . Cancer Sister     Breast  . Diabetes Brother   . Dementia Mother     Social History:  reports that she quit smoking about 8 months ago. Her smoking use included Cigarettes. She has a 7.5 pack-year smoking history. She does not have any smokeless tobacco history on file. She reports that she does not drink alcohol or use illicit drugs.  Allergies: No Known Allergies  Medications: Prior to Admission medications   Medication Sig Start Date End Date Taking? Authorizing Provider  amoxicillin-clavulanate (AUGMENTIN) 200-28.5 MG/5ML suspension Place 21.9 mLs (875 mg total) into feeding tube every 12 (twelve) hours. For 2 more days 10/21/15   [name], MD  collagenase (SANTYL) ointment Apply 1 application topically daily.    Historical Provider, MD  enoxaparin (LOVENOX) 40 MG/0.4ML injection Inject 0.4 mLs (40 mg total) into the skin daily. 09/08/15   [name], MD  FLUoxetine (PROZAC) 10 MG capsule TAKE 1 CAPSULE BY MOUTH DAILY FOR DEPRESSION/ANXIETY 09/15/15   [name], MD  levETIRAcetam (KEPPRA) 750 MG tablet TAKE 1 TABLET BY MOUTH EVERY MORNING AND 2 TABLETS BY MOUTH EVERY NIGHT AT BEDTIME 08/01/15   [name], MD  loperamide (IMODIUM) 2 MG capsule Take 1 capsule (2 mg total) by mouth every 4 (four) hours as needed for diarrhea or loose stools. 10/21/15   [name], MD  metFORMIN (GLUCOPHAGE) 500 MG tablet Take one half tablet BID Patient taking differently: Take 250 mg by mouth 2 (two) times daily with a meal.  04/25/15   [name], MD  methylphenidate (RITALIN) 5 MG tablet Take one tablet per tube twice daily with breakfast and lunch 10/24/15   [name], DO  metoCLOPramide (REGLAN) 10 MG tablet Place 1 tablet (10 mg total) into feeding tube every 6 (six) hours as needed for nausea. 10/21/15   [name], MD    metoprolol tartrate (LOPRESSOR) 25 MG tablet Take 25 mg by mouth 2 (two) times daily.    Historical Provider, MD  NIFEdipine (PROCARDIA XL/ADALAT-CC) 60 MG 24 hr tablet Take 1 tablet (60 mg total) by mouth daily. DO NOT CRUSH 07/12/15   [name], MD  Nutritional Supplements (FEEDING SUPPLEMENT, VITAL AF 1.2 CAL,) LIQD Place 1,000 mLs into feeding tube daily. 10/21/15   [name], MD  ondansetron (ZOFRAN ODT) 4 MG disintegrating tablet Take 1 tablet (4 mg total) by mouth every 8 (eight) hours as needed for nausea or vomiting. 10/08/15   [name], MD  pravastatin (PRAVACHOL) 40 MG tablet Take 1 tablet (40 mg total) by mouth daily. 07/20/15   [name], MD  raloxifene (EVISTA) 60 MG tablet Take 1 tablet (60 mg total) by mouth daily. Patient not taking: Reported on 10/17/2015 07/12/15   [name], MD  senna-docusate (SENOKOT-S) 8.6-50 MG per tablet Take 2 tablets by mouth 2 (two) times daily. For constipation 04/22/15   Ivan Anchors Love, PA-C  sodium chloride 1 G tablet Take 1 g by mouth 2 (two) times daily with a meal.    Historical Provider, MD  warfarin (COUMADIN) 2 MG tablet Take 1/2 a pill on Friday evening and take a whole pill on  all other days. Patient taking differently: Take 1-2 mg by mouth See admin instructions. Take 1 mg on Tues, Thurs, and Sat.  Take 2 mg on Sun, Mon, Wed, and Fri. 07/12/15   [name], MD  Water For Irrigation, Sterile (FREE WATER) SOLN Place 200 mLs into feeding tube every 8 (eight) hours. 10/21/15   [name], MD    Scheduled Meds: Continuous Infusions: PRN Meds:.     Results for orders placed or performed during the hospital encounter of 10/28/15 (from the past 48 hour(s))  CBC     Status: Abnormal   Collection Time: 10/28/15  7:15 AM  Result Value Ref Range   WBC 13.4 (H) 4.0 - 10.5 K/uL   RBC 3.28 (L) 3.87 - 5.11 MIL/uL   Hemoglobin 8.6 (L) 12.0 - 15.0 g/dL   HCT 27.1 (L) 36.0 - 46.0 %   MCV 82.6 78.0 - 100.0 fL   MCH 26.2 26.0  - 34.0 pg   MCHC 31.7 30.0 - 36.0 g/dL   RDW 16.7 (H) 11.5 - 15.5 %   Platelets 493 (H) 150 - 400 K/uL    Studies/Results:     Miloh Alcocer A. Merlene Laughter, M.D.  Diplomate, Tax adviser of Psychiatry and Neurology ( Neurology). 10/29/2015, 2:40 PM

## 2015-10-29 NOTE — ED Provider Notes (Signed)
CSN: [id number]     Arrival date & time 10/29/15  2022 History   First MD Initiated Contact with Patient 10/29/15 2025     Chief Complaint  Patient presents with  . Shortness of Breath     (Consider location/radiation/quality/duration/timing/severity/associated sxs/prior Treatment) HPI Comments: The patient is a 74 year old female who presents to the hospital by paramedic transport from the nursing home where she was discharged to within the last week. According to the medical record the patient has a history of a parotidectomy which occurred at the beginning of October, postoperatively the patient had an aspiration pneumonia, she had a pneumothorax requiring tube thoracostomy and ended up on life support which she was successfully weaned from. Since being discharged home the patient had a recurrent aspiration event, aspiration pneumonia and sepsis was treated 2 weeks ago and the patient was discharged to her nursing facility. She returns today after it was reported by nursing staff to the paramedics that the patient has been having increased work of breathing and respiratory distress throughout the day, progressively worsening and has now become severe.  The patient is unable to communicate secondary to severe respiratory distress requiring high  flow nonrebreather  Patient is a 74 y.o. female presenting with shortness of breath. The history is provided by the EMS personnel, the nursing home and medical records.  Shortness of Breath   Past Medical History  Diagnosis Date  . Essential hypertension, benign   . Type 2 diabetes mellitus ([place])   . Osteoporosis 06/2008  . 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 History of parotid cancer    Past Surgical History  Procedure Laterality Date  . Ankle surgery Left 2004    otif  . Abdominal hysterectomy    . Anterior and posterior repair N/A 03/11/2013    Procedure: ANTERIOR (CYSTOCELE) AND POSTERIOR REPAIR (RECTOCELE);  Surgeon: Linda Hedges, DO;  Location: [facility];  Service: Gynecology;  Laterality: N/A;  with sacrospinous ligament fixation bilateral  . Colonoscopy  2009    negative  . Cataract extraction w/phaco Right 12/27/2014    Procedure: CATARACT EXTRACTION PHACO AND INTRAOCULAR LENS PLACEMENT RIGHT EYE;  Surgeon: Tonny Branch, MD;  Location: [facility];  Service: Ophthalmology;  Laterality: Right;  CDE:10.63  . Cataract extraction w/phaco Left 01/13/2015    Procedure: CATARACT EXTRACTION PHACO AND INTRAOCULAR LENS PLACEMENT LEFT EYE;  Surgeon: Tonny Branch, MD;  Location: [facility];  Service: Ophthalmology;  Laterality: Left;  CDE:18.87  . Cholecystectomy    . Tee without cardioversion N/A 04/01/2015    Procedure: TRANSESOPHAGEAL ECHOCARDIOGRAM (TEE);  Surgeon: Sanda Klein, MD;  Location: [facility] END[facility];  Service: Cardiovascular;  Laterality: N/A;  . Peg placement    . Parotidectomy     Family History  Problem Relation Age of Onset  . Cancer Father     Liver  . Cancer Sister     Breast  . Diabetes Brother   . Dementia Mother    Social History  Substance Use Topics  . Smoking status: Former Smoker -- 0.25 packs/day for 30 years    Types: Cigarettes    Quit date: 02/27/2015  . Smokeless tobacco: None  . Alcohol Use: No   OB History    No data available     Review of Systems  Unable to perform ROS: Severe respiratory distress  Respiratory: Positive for shortness of breath.       Allergies  Review of patient's allergies indicates no known allergies.  Home Medications  Prior to Admission medications   Medication Sig Start Date End Date Taking? Authorizing Provider  clotrimazole-betamethasone (LOTRISONE) cream Apply 1 application topically daily as needed (APPLY A THIN LAYER AS BARRIER AFTER CLEANING AND DRYING BUTTOCKS AREA COMPLETELY).   Yes Historical Provider, MD  enoxaparin (LOVENOX) 40 MG/0.4ML injection Inject 0.4 mLs (40 mg total) into the skin daily. 09/08/15  Yes [name], MD  FLUoxetine  (PROZAC) 10 MG capsule TAKE 1 CAPSULE BY MOUTH DAILY FOR DEPRESSION/ANXIETY Patient taking differently: TAKE 1 CAPSULE VIA PEG DAILY FOR DEPRESSION/ANXIETY 09/15/15  Yes [name], MD  furosemide (LASIX) 20 MG tablet 20 mg by PEG Tube route daily.   Yes Historical Provider, MD  levETIRAcetam (KEPPRA) 750 MG tablet TAKE 1 TABLET BY MOUTH EVERY MORNING AND 2 TABLETS BY MOUTH EVERY NIGHT AT BEDTIME Patient taking differently: TAKE 1 TABLET VIA PEG EVERY MORNING AND 2 TABLETS BY MOUTH EVERY NIGHT AT BEDTIME 08/01/15  Yes [name], MD  loperamide (IMODIUM) 2 MG capsule Take 1 capsule (2 mg total) by mouth every 4 (four) hours as needed for diarrhea or loose stools. 10/21/15  Yes [name], MD  metFORMIN (GLUCOPHAGE) 500 MG tablet Take one half tablet BID Patient taking differently: 250 mg by PEG Tube route 2 (two) times daily with a meal.  04/25/15  Yes [name], MD  methylphenidate (RITALIN) 5 MG tablet Take one tablet per tube twice daily with breakfast and lunch Patient taking differently: 5 mg by PEG Tube route 2 (two) times daily with breakfast and lunch.  10/24/15  Yes [name], DO  metoCLOPramide (REGLAN) 10 MG tablet Place 1 tablet (10 mg total) into feeding tube every 6 (six) hours as needed for nausea. 10/21/15  Yes [name], MD  metoprolol tartrate (LOPRESSOR) 25 MG tablet Take 25 mg by mouth 2 (two) times daily.   Yes Historical Provider, MD  nystatin (MYCOSTATIN/NYSTOP) 100000 UNIT/GM POWD Apply topically 3 (three) times daily. **APPLY A DUSTING TO RASH ON PERINEUM AND BUTTOCKS AT [facility] SHIFT   Yes Historical Provider, MD  ondansetron (ZOFRAN ODT) 4 MG disintegrating tablet Take 1 tablet (4 mg total) by mouth every 8 (eight) hours as needed for nausea or vomiting. Patient taking differently: 4 mg by PEG Tube route every 8 (eight) hours as needed for nausea or vomiting.  10/08/15  Yes [name], MD  pravastatin (PRAVACHOL) 40 MG tablet Take 1 tablet (40 mg total) by  mouth daily. Patient taking differently: 40 mg by PEG Tube route every evening.  07/20/15  Yes [name], MD  sodium chloride 1 G tablet 1 g by PEG Tube route 2 (two) times daily with a meal.    Yes Historical Provider, MD  warfarin (COUMADIN) 3 MG tablet 3 mg by PEG Tube route daily.   Yes Historical Provider, MD  Water For Irrigation, Sterile (FREE WATER) SOLN Place 200 mLs into feeding tube every 8 (eight) hours. 10/21/15  Yes [name], MD  amoxicillin-clavulanate (AUGMENTIN) 200-28.5 MG/5ML suspension Place 21.9 mLs (875 mg total) into feeding tube every 12 (twelve) hours. For 2 more days Patient not taking: Reported on 10/29/2015 10/21/15   [name], MD  NIFEdipine (PROCARDIA XL/ADALAT-CC) 60 MG 24 hr tablet Take 1 tablet (60 mg total) by mouth daily. DO NOT CRUSH Patient not taking: Reported on 10/29/2015 07/12/15   [name], MD  Nutritional Supplements (FEEDING SUPPLEMENT, VITAL AF 1.2 CAL,) LIQD Place 1,000 mLs into feeding tube daily. Patient not  taking: Reported on 10/29/2015 10/21/15   [name], MD  raloxifene (EVISTA) 60 MG tablet Take 1 tablet (60 mg total) by mouth daily. Patient not taking: Reported on 10/17/2015 07/12/15   [name], MD  senna-docusate (SENOKOT-S) 8.6-50 MG per tablet Take 2 tablets by mouth 2 (two) times daily. For constipation Patient not taking: Reported on 10/29/2015 04/22/15   Ivan Anchors Love, PA-C  warfarin (COUMADIN) 2 MG tablet Take 1/2 a pill on Friday evening and take a whole pill on all other days. Patient not taking: Reported on 10/29/2015 07/12/15   [name], MD   BP 154/91 mmHg  Pulse 114  Temp(Src) 99.7 F (37.6 C) (Oral)  Resp 32  SpO2 98% Physical Exam  Constitutional: She appears distressed.  HENT:  Normocephalic, atraumatic, oropharynx is dry but no obvious abnormalities  Eyes:  Conjunctivae are clear, pupils are equal round and reactive to light  Neck:  Postoperative bandages clean dry and intact, no  bleeding, no masses, no swelling  Cardiovascular:  Severe tachycardia to 140 bpm in a sinus tachycardia, strong pulses at the radial arteries  Pulmonary/Chest:  Severe decreased breath sounds with expiratory wheezing diffusely.  Abdominal:  Soft nontender abdomen  Musculoskeletal:  Supple extremities and joints, diffuse bilateral edema especially in the arms and legs  Neurological:  Follows commands, severe generalized weakness, awake, alert, unable to speak secondary to respiratory distress  Skin:  Skin is mildly diaphoretic    ED Course  Procedures (including critical care time) Labs Review Labs Reviewed  CBC WITH DIFFERENTIAL/PLATELET - Abnormal; Notable for the following:    WBC 22.7 (*)    RBC 3.68 (*)    Hemoglobin 9.5 (*)    HCT 30.6 (*)    MCH 25.8 (*)    RDW 16.7 (*)    Platelets 670 (*)    Neutro Abs 18.3 (*)    All other components within normal limits  COMPREHENSIVE METABOLIC PANEL - Abnormal; Notable for the following:    Glucose, Bld 185 (*)    BUN 33 (*)    Total Protein 6.1 (*)    Albumin 2.1 (*)    Total Bilirubin 0.2 (*)    All other components within normal limits  TROPONIN I - Abnormal; Notable for the following:    Troponin I 0.10 (*)    All other components within normal limits  BRAIN NATRIURETIC PEPTIDE - Abnormal; Notable for the following:    B Natriuretic Peptide >4500.0 (*)    All other components within normal limits  PROTIME-INR - Abnormal; Notable for the following:    Prothrombin Time 15.7 (*)    All other components within normal limits  BLOOD GAS, ARTERIAL - Abnormal; Notable for the following:    pCO2 arterial 34.9 (*)    pO2, Arterial 105.00 (*)    All other components within normal limits  I-STAT CG4 LACTIC ACID, ED - Abnormal; Notable for the following:    Lactic Acid, Venous 2.46 (*)    All other components within normal limits  CULTURE, BLOOD (ROUTINE X 2)  CULTURE, BLOOD (ROUTINE X 2)  APTT    Imaging Review Dg Chest  Portable 1 View  10/29/2015  CLINICAL DATA:  Respiratory distress EXAM: PORTABLE CHEST - 1 VIEW COMPARISON:  10/17/2015 FINDINGS: Cardiac shadow is stable. Lungs are well aerated bilaterally. Mild vascular congestion is noted as well as bibasilar increased density likely related atelectasis or early infiltrate. No bony abnormality is seen. IMPRESSION: Increasing bibasilar densities  and mild vascular congestion. Electronically Signed   By: Inez Catalina M.D.   On: 10/29/2015 20:52   I have personally reviewed and evaluated these images and lab results as part of my medical decision-making.   EKG Interpretation   Date/Time:  Saturday October 29 2015 20:30:36 EST Ventricular Rate:  137 PR Interval:  158 QRS Duration: 73 QT Interval:  278 QTC Calculation: 420 R Axis:   54 Text Interpretation:  Sinus tachycardia Anterior infarct, old Baseline  wander in lead(s) II III aVF Since last tracing rate faster Confirmed by  Dayan Desa  MD, Lyric Hoar ([id number]) on 10/29/2015 8:47:16 PM      MDM   Final diagnoses:  Acute congestive heart failure, unspecified congestive heart failure type ([place])  Acute respiratory failure with hypoxia ([facility])    The patient is in severe respiratory distress, she has an EKG showing sinus tachycardia with poor R-wave progression and nonspecific T waves but otherwise shows no signs of ischemia. Her oxygenation was found to be in the high 80% with very little air movement. She is a full code. I have placed her on BiPAP immediately on arrival with a continuous nebulizer. She is in critical condition, I would consider that this could be third spacing fluid overload from her recent sepsis resuscitation. Would also consider congestive heart failure, would also consider severe COPD. Or aspiration pneumonia or recurrent pneumothorax.  The patient has ongoing respiratory distress though it is significantly improved with BiPAP and a continuous nebulizer. Chest x-ray reveals some lower  infiltrates, this is likely some pulmonary edema as she also has significant peripheral edema likely from fluid overload. Labs suggest an elevated troponin and a significantly elevated BNP which also signifies acute  congestive heart failure.  I discussed her care with the hospitalist, Dr. Orvan Falconer who will admit the patient to stepdown.  CRITICAL CARE Performed by: Johnna Acosta Total critical care time: 35 minutes Critical care time was exclusive of separately billable procedures and treating other patients. Critical care was necessary to treat or prevent imminent or life-threatening deterioration. Critical care was time spent personally by me on the following activities: development of treatment plan with patient and/or surrogate as well as nursing, discussions with consultants, evaluation of patient's response to treatment, examination of patient, obtaining history from patient or surrogate, ordering and performing treatments and interventions, ordering and review of laboratory studies, ordering and review of radiographic studies, pulse oximetry and re-evaluation of patient's condition.     Noemi Chapel, MD 10/29/15 2212

## 2015-10-30 DIAGNOSIS — I110 Hypertensive heart disease with heart failure: Secondary | ICD-10-CM | POA: Diagnosis not present

## 2015-10-30 DIAGNOSIS — I63412 Cerebral infarction due to embolism of left middle cerebral artery: Secondary | ICD-10-CM

## 2015-10-30 DIAGNOSIS — R0600 Dyspnea, unspecified: Secondary | ICD-10-CM

## 2015-10-30 LAB — URINALYSIS W MICROSCOPIC (NOT AT ARMC)
Bilirubin Urine: NEGATIVE
Glucose, UA: NEGATIVE mg/dL
Ketones, ur: NEGATIVE mg/dL
Nitrite: POSITIVE — AB
Protein, ur: 100 mg/dL — AB
SPECIFIC GRAVITY, URINE: 1.02 (ref 1.005–1.030)
Urobilinogen, UA: 0.2 mg/dL (ref 0.0–1.0)
pH: 5.5 (ref 5.0–8.0)

## 2015-10-30 LAB — TROPONIN I
TROPONIN I: 0.05 ng/mL — AB (ref <0.031)
Troponin I: 0.09 ng/mL — ABNORMAL HIGH (ref <0.031)
Troponin I: 0.06 ng/mL — ABNORMAL HIGH (ref <0.031)

## 2015-10-30 LAB — PROTIME-INR
INR: 2.72 — ABNORMAL HIGH (ref 0.00–1.49)
Prothrombin Time: 28.4 seconds — ABNORMAL HIGH (ref 11.6–15.2)

## 2015-10-30 LAB — TSH: TSH: 0.575 u[IU]/mL (ref 0.350–4.500)

## 2015-10-30 MED ORDER — BUMETANIDE 0.25 MG/ML IJ SOLN
1.0000 mg | Freq: Two times a day (BID) | INTRAMUSCULAR | Status: DC
Start: 2015-10-30 — End: 2015-11-01
  Administered 2015-10-30 – 2015-11-01 (×4): 1 mg via INTRAVENOUS
  Filled 2015-10-30 (×4): qty 4

## 2015-10-30 MED ORDER — ENSURE ENLIVE PO LIQD
237.0000 mL | Freq: Two times a day (BID) | ORAL | Status: DC
  Administered 2015-10-30 – 2015-10-31 (×2): 237 mL via ORAL

## 2015-10-30 MED ORDER — WARFARIN SODIUM 2 MG PO TABS
3.0000 mg | ORAL_TABLET | ORAL | Status: DC
  Administered 2015-10-30 – 2015-10-31 (×2): 3 mg via ORAL
  Filled 2015-10-30 (×4): qty 1

## 2015-10-30 MED ORDER — NYSTATIN 100000 UNIT/GM EX POWD
CUTANEOUS | Status: AC
  Filled 2015-10-30: qty 15

## 2015-10-30 MED ORDER — ALBUTEROL SULFATE (2.5 MG/3ML) 0.083% IN NEBU
2.5000 mg | INHALATION_SOLUTION | Freq: Four times a day (QID) | RESPIRATORY_TRACT | Status: DC
  Administered 2015-10-30 – 2015-11-02 (×15): 2.5 mg via RESPIRATORY_TRACT
  Filled 2015-10-30 (×15): qty 3

## 2015-10-30 MED ORDER — WARFARIN - PHARMACIST DOSING INPATIENT
Freq: Every day | Status: DC
  Administered 2015-10-30 – 2015-10-31 (×2)

## 2015-10-30 NOTE — Progress Notes (Signed)
ANTICOAGULATION CONSULT NOTE - Initial Consult  Pharmacy Consult for coumadin Indication: CVA  No Known Allergies  Patient Measurements: Height: 5\' 6"  (167.6 cm) Weight: 100 lb 8.5 oz (45.6 kg) IBW/kg (Calculated) : 59.3  Vital Signs: Temp: 97.3 F (36.3 C) (11/13 0833) Temp Source: Axillary (11/13 0833) BP: 120/61 mmHg (11/13 0800) Pulse Rate: 81 (11/13 0806)  Labs:  Recent Labs  10/28/15 0715 10/29/15 2040 10/30/15 0121 10/30/15 0439  HGB 8.6* 9.5*  --   --   HCT 27.1* 30.6*  --   --   PLT 493* 670*  --   --   APTT  --  34  --   --   LABPROT  --  15.7*  --  28.4*  INR  --  1.23  --  2.72*  CREATININE  --  0.48  --   --   TROPONINI  --  0.10* 0.09* 0.06*    Estimated Creatinine Clearance: 44.4 mL/min (by C-G formula based on Cr of 0.48).   Medical History: Past Medical History  Diagnosis Date  . Essential hypertension, benign   . Type 2 diabetes mellitus ([place])   . Osteoporosis 06/2008  . 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 History of parotid cancer     Medications:  Prescriptions prior to admission  Medication Sig Dispense Refill Last Dose  . clotrimazole-betamethasone (LOTRISONE) cream Apply 1 application topically daily as needed (APPLY A THIN LAYER AS BARRIER AFTER CLEANING AND DRYING BUTTOCKS AREA COMPLETELY).   10/29/2015 at Unknown time  . enoxaparin (LOVENOX) 40 MG/0.4ML injection Inject 0.4 mLs (40 mg total) into the skin daily. 14 Syringe 0 10/29/2015 at 1700  . FLUoxetine (PROZAC) 10 MG capsule TAKE 1 CAPSULE BY MOUTH DAILY FOR DEPRESSION/ANXIETY (Patient taking differently: TAKE 1 CAPSULE VIA PEG DAILY FOR DEPRESSION/ANXIETY) 90 capsule 1 10/29/2015 at 900A  . furosemide (LASIX) 20 MG tablet 20 mg by PEG Tube route daily.   10/29/2015 at 900A  . levETIRAcetam (KEPPRA) 750 MG tablet TAKE 1 TABLET BY MOUTH EVERY MORNING AND 2 TABLETS BY MOUTH EVERY NIGHT AT BEDTIME (Patient taking differently: TAKE 1 TABLET VIA PEG EVERY MORNING AND 2  TABLETS BY MOUTH EVERY NIGHT AT BEDTIME) 270 tablet 3 10/29/2015 at 900A  . loperamide (IMODIUM) 2 MG capsule Take 1 capsule (2 mg total) by mouth every 4 (four) hours as needed for diarrhea or loose stools. 30 capsule 0 UNKNOWN  . metFORMIN (GLUCOPHAGE) 500 MG tablet Take one half tablet BID (Patient taking differently: 250 mg by PEG Tube route 2 (two) times daily with a meal. ) 30 tablet 5 10/29/2015 at 900A  . methylphenidate (RITALIN) 5 MG tablet Take one tablet per tube twice daily with breakfast and lunch (Patient taking differently: 5 mg by PEG Tube route 2 (two) times daily with breakfast and lunch. ) 60 tablet 0 10/29/2015 at 1200  . metoCLOPramide (REGLAN) 10 MG tablet Place 1 tablet (10 mg total) into feeding tube every 6 (six) hours as needed for nausea.   UNKNOWN  . metoprolol tartrate (LOPRESSOR) 25 MG tablet Take 25 mg by mouth 2 (two) times daily.   10/29/2015 at 900A  . nystatin (MYCOSTATIN/NYSTOP) 100000 UNIT/GM POWD Apply topically 3 (three) times daily. **APPLY A DUSTING TO RASH ON PERINEUM AND BUTTOCKS AT [facility] SHIFT   10/29/2015 at Unknown time  . ondansetron (ZOFRAN ODT) 4 MG disintegrating tablet Take 1 tablet (4 mg total) by mouth every 8 (eight) hours as needed for nausea  or vomiting. (Patient taking differently: 4 mg by PEG Tube route every 8 (eight) hours as needed for nausea or vomiting. ) 20 tablet 0 UNKNOWN at Unknown time  . pravastatin (PRAVACHOL) 40 MG tablet Take 1 tablet (40 mg total) by mouth daily. (Patient taking differently: 40 mg by PEG Tube route every evening. ) 30 tablet 12 10/28/2015 at 2100  . sodium chloride 1 G tablet 1 g by PEG Tube route 2 (two) times daily with a meal.    10/29/2015 at 1700  . warfarin (COUMADIN) 3 MG tablet 3 mg by PEG Tube route daily.   10/29/2015 at 1800  . Water For Irrigation, Sterile (FREE WATER) SOLN Place 200 mLs into feeding tube every 8 (eight) hours.   10/29/2015 at Unknown time  . amoxicillin-clavulanate (AUGMENTIN) 200-28.5  MG/5ML suspension Place 21.9 mLs (875 mg total) into feeding tube every 12 (twelve) hours. For 2 more days (Patient not taking: Reported on 10/29/2015) 100 mL 0   . NIFEdipine (PROCARDIA XL/ADALAT-CC) 60 MG 24 hr tablet Take 1 tablet (60 mg total) by mouth daily. DO NOT CRUSH (Patient not taking: Reported on 10/29/2015) 30 tablet 5 10/16/2015 at Unknown time  . Nutritional Supplements (FEEDING SUPPLEMENT, VITAL AF 1.2 CAL,) LIQD Place 1,000 mLs into feeding tube daily. (Patient not taking: Reported on 10/29/2015)     . raloxifene (EVISTA) 60 MG tablet Take 1 tablet (60 mg total) by mouth daily. (Patient not taking: Reported on 10/17/2015) 90 tablet 1 10/07/2015 at Unknown time  . senna-docusate (SENOKOT-S) 8.6-50 MG per tablet Take 2 tablets by mouth 2 (two) times daily. For constipation (Patient not taking: Reported on 10/29/2015) 100 tablet 1 10/16/2015 at Unknown time  . warfarin (COUMADIN) 2 MG tablet Take 1/2 a pill on Friday evening and take a whole pill on all other days. (Patient not taking: Reported on 10/29/2015) 30 tablet 5 10/16/2015 at Unknown time    Assessment: 74 yo lady admitted with respiratory distress to continue coumadin for h/o CVA.  INR this am is therapeutic. Goal of Therapy:  INR 2-3 Monitor platelets by anticoagulation protocol: Yes   Plan:  Coumadin 3 mg per tube daily Daily PT/INR Monitor for bleeding complications  Thanks for allowing pharmacy to be a part of this patient's care.  Excell Seltzer, PharmD Clinical Pharmacist 10/30/2015,10:47 AM

## 2015-10-30 NOTE — Progress Notes (Signed)
Urine leakage noted from around foley catheter. Foley bulb deflated and re-inflated.

## 2015-10-30 NOTE — Progress Notes (Signed)
[facility] PROGRESS NOTE  MEKENZI MLECZKO [id number] DOB: [birth year] DOA: 10/29/2015 PCP: [name], MD  HPI/Brief narrative 74 y.o. female with multiple medical problem including embolic CVA with right hemiparesis, on anticoauglatuion with coumadin, hx of parotid cancer s/p excision, complicated with respiratory failure requiring intubation and pneumothorax requriing chest tube, hx of DM, HTN, PEG tube placement, diastolic CHF with EF [id number], recent admission for sepsis, brought to the ER as she was having respiratory distress. Pt was admitted for concerns of acute on chronic diastolic CHF exacerbation.  Assessment/Plan: 1. Acute on chronic diastolic CHF with acute hypoxic respiratory failure 1. CXR with findings of vascular congestion 2. Presenting BNP of >4500 3. Pt had been bipap dependent since admit. O2 sats currently 100% 4. Pt is continued on IV bumex at 0.5mg  bid 5. Cont to follow daily weights and I/O 2. Elevated troponin 1. Suspect secondary to trop lead from acute heart failure 2. Trop trending down 3. Hx CVA 1. Seems stable at present 4. Hx seizures 1. Stable thus far 5. DVT prophylaxis 1. On therapeutic coumadin  Code Status: Full Family Communication: Pt in room, family at bedside Disposition Plan: discharge when back to baseline O2 requirements and no longer in heart failure   Consultants:    Procedures:    Antibiotics: Anti-infectives    None      HPI/Subjective: Reports feeling better today  Objective: Filed Vitals:   10/30/15 1151 10/30/15 1152 10/30/15 1200 10/30/15 1300  BP:   97/53 100/52  Pulse:  89 80 94  Temp:      TempSrc:      Resp:  14 14 16   Height:      Weight:      SpO2: 100% 100% 100% 100%    Intake/Output Summary (Last 24 hours) at 10/30/15 1459 Last data filed at 10/30/15 0500  Gross per 24 hour  Intake      0 ml  Output    300 ml  Net   -300 ml   Filed Weights   10/29/15 2316 10/30/15 0500  Weight:  45.5 kg (100 lb 5 oz) 45.6 kg (100 lb 8.5 oz)    Exam:   General:  Awake, in nad  Cardiovascular: regular, s1, s2  Respiratory: on bipap, no wheezing  Abdomen: soft, nondistended  Musculoskeletal: perfused, no clubbing   Data Reviewed: Basic Metabolic Panel:  Recent Labs Lab 10/24/15 0630 10/29/15 2040  NA 139 139  K 4.0 4.9  CL 107 105  CO2 25 24  GLUCOSE 142* 185*  BUN 24* 33*  CREATININE 0.45 0.48  CALCIUM 8.3* 8.9   Liver Function Tests:  Recent Labs Lab 10/29/15 2040  AST 25  ALT 26  ALKPHOS 62  BILITOT 0.2*  PROT 6.1*  ALBUMIN 2.1*   No results for input(s): LIPASE, AMYLASE in the last 168 hours. No results for input(s): AMMONIA in the last 168 hours. CBC:  Recent Labs Lab 10/24/15 0630 10/28/15 0715 10/29/15 2040  WBC 14.3* 13.4* 22.7*  NEUTROABS 11.2*  --  18.3*  HGB 8.5* 8.6* 9.5*  HCT 27.6* 27.1* 30.6*  MCV 84.1 82.6 83.2  PLT 601* 493* 670*   Cardiac Enzymes:  Recent Labs Lab 10/26/15 0825 10/29/15 2040 10/30/15 0121 10/30/15 0439 10/30/15 1114  CKTOTAL 14*  --   --   --   --   TROPONINI  --  0.10* 0.09* 0.06* 0.05*   BNP (last 3 results)  Recent Labs  10/17/15 0945 10/29/15 2040  BNP 118.0* >4500.0*    ProBNP (last 3 results) No results for input(s): PROBNP in the last 8760 hours.  CBG: No results for input(s): GLUCAP in the last 168 hours.  Recent Results (from the past 240 hour(s))  Stool culture     Status: None   Collection Time: 10/23/15 11:00 AM  Result Value Ref Range Status   Specimen Description STOOL  Final   Special Requests NONE  Final   Culture   Final    ABUNDANT YEAST NO SALMONELLA, SHIGELLA, CAMPYLOBACTER, YERSINIA, OR E.COLI 0157:H7 ISOLATED Note: REDUCED NORMAL FLORA PRESENT Performed at [facility]    Report Status 10/28/2015 FINAL  Final  C difficile quick scan w PCR reflex     Status: None   Collection Time: 10/23/15 11:00 AM  Result Value Ref Range Status   C Diff antigen  NEGATIVE NEGATIVE Final   C Diff toxin NEGATIVE NEGATIVE Final   C Diff interpretation Negative for toxigenic C. difficile  Final  Blood culture (routine x 2)     Status: None (Preliminary result)   Collection Time: 10/29/15  8:40 PM  Result Value Ref Range Status   Specimen Description BLOOD RIGHT ANTECUBITAL  Final   Special Requests   Final    BOTTLES DRAWN AEROBIC AND ANAEROBIC AEB 4CC ANA 3CC   Culture NO GROWTH < 12 HOURS  Final   Report Status PENDING  Incomplete  Blood culture (routine x 2)     Status: None (Preliminary result)   Collection Time: 10/29/15  8:43 PM  Result Value Ref Range Status   Specimen Description BLOOD LEFT ANTECUBITAL  Final   Special Requests BOTTLES DRAWN AEROBIC AND ANAEROBIC 3CC EACH  Final   Culture NO GROWTH < 12 HOURS  Final   Report Status PENDING  Incomplete     Studies: Dg Chest Portable 1 View  10/29/2015  CLINICAL DATA:  Respiratory distress EXAM: PORTABLE CHEST - 1 VIEW COMPARISON:  10/17/2015 FINDINGS: Cardiac shadow is stable. Lungs are well aerated bilaterally. Mild vascular congestion is noted as well as bibasilar increased density likely related atelectasis or early infiltrate. No bony abnormality is seen. IMPRESSION: Increasing bibasilar densities and mild vascular congestion. Electronically Signed   By: Inez Catalina M.D.   On: 10/29/2015 20:52    Scheduled Meds: . albuterol  2.5 mg Nebulization QID  . antiseptic oral rinse  7 mL Mouth Rinse q12n4p  . aspirin EC  81 mg Oral Daily  . bumetanide (BUMEX) IV  0.5 mg Intravenous Q12H  . chlorhexidine  15 mL Mouth Rinse BID  . feeding supplement (ENSURE ENLIVE)  237 mL Oral BID BM  . FLUoxetine  10 mg Per Tube Daily  . free water  200 mL Per Tube 3 times per day  . levETIRAcetam  1,500 mg Oral QHS  . [START ON 10/31/2015] levETIRAcetam  750 mg Per Tube q morning - 10a  . nystatin   Topical TID  . pravastatin  40 mg Per Tube QPM  . senna-docusate  2 tablet Oral BID  . sodium chloride   3 mL Intravenous Q12H  . warfarin  3 mg Oral Q24H  . 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 If 7PM-7AM, please contact night-coverage at [email or web address], password [facility] 10/30/2015, 2:59 PM  LOS: 1 day

## 2015-10-31 DIAGNOSIS — A419 Sepsis, unspecified organism: Secondary | ICD-10-CM

## 2015-10-31 DIAGNOSIS — N390 Urinary tract infection, site not specified: Secondary | ICD-10-CM

## 2015-10-31 LAB — CBC
HCT: 26.5 % — ABNORMAL LOW (ref 36.0–46.0)
Hemoglobin: 8.2 g/dL — ABNORMAL LOW (ref 12.0–15.0)
MCH: 25.6 pg — ABNORMAL LOW (ref 26.0–34.0)
MCHC: 30.9 g/dL (ref 30.0–36.0)
MCV: 82.8 fL (ref 78.0–100.0)
Platelets: 566 10*3/uL — ABNORMAL HIGH (ref 150–400)
RBC: 3.2 MIL/uL — ABNORMAL LOW (ref 3.87–5.11)
RDW: 16.5 % — ABNORMAL HIGH (ref 11.5–15.5)
WBC: 13.7 10*3/uL — ABNORMAL HIGH (ref 4.0–10.5)

## 2015-10-31 LAB — FECAL LACTOFERRIN, QUANT

## 2015-10-31 LAB — BASIC METABOLIC PANEL WITH GFR
Anion gap: 6 (ref 5–15)
BUN: 39 mg/dL — ABNORMAL HIGH (ref 6–20)
CO2: 28 mmol/L (ref 22–32)
Calcium: 8 mg/dL — ABNORMAL LOW (ref 8.9–10.3)
Chloride: 104 mmol/L (ref 101–111)
Creatinine, Ser: 0.53 mg/dL (ref 0.44–1.00)
GFR calc Af Amer: >60 mL/min
GFR calc non Af Amer: >60 mL/min
Glucose, Bld: 127 mg/dL — ABNORMAL HIGH (ref 65–99)
Potassium: 3.8 mmol/L (ref 3.5–5.1)
Sodium: 138 mmol/L (ref 135–145)

## 2015-10-31 LAB — PROTIME-INR
INR: 2.05 — AB (ref 0.00–1.49)
PROTHROMBIN TIME: 23 s — AB (ref 11.6–15.2)

## 2015-10-31 MED ORDER — CEFTRIAXONE SODIUM 1 G IJ SOLR
1.0000 g | INTRAMUSCULAR | Status: DC
  Administered 2015-10-31 – 2015-11-02 (×3): 1 g via INTRAVENOUS
  Filled 2015-10-31 (×4): qty 10

## 2015-10-31 MED ORDER — VITAL HIGH PROTEIN PO LIQD: 1000.0000 mL | ORAL | Status: DC

## 2015-10-31 MED ORDER — VITAL AF 1.2 CAL PO LIQD
1000.0000 mL | ORAL | Status: DC
  Administered 2015-10-31 – 2015-11-02 (×3): 1000 mL
  Filled 2015-10-31 (×4): qty 1000

## 2015-10-31 NOTE — Progress Notes (Addendum)
ANTIBIOTIC CONSULT NOTE - INITIAL  Pharmacy Consult for rocephin Indication: UTI  No Known Allergies  Patient Measurements: Height: 5\' 6"  (167.6 cm) Weight: 98 lb 8.7 oz (44.7 kg) IBW/kg (Calculated) : 59.3   Vital Signs: Temp: 98.1 F (36.7 C) (11/14 0800) Temp Source: Axillary (11/14 0800) BP: 112/56 mmHg (11/14 1100) Pulse Rate: 104 (11/14 1100) Intake/Output from previous day: 11/13 0701 - 11/14 0700 In: -  Out: 1000 [Urine:1000] Intake/Output from this shift: Total I/O In: 50 [IV Piggyback:50] Out: -   Labs:  Recent Labs  10/29/15 2040 10/31/15 0444  WBC 22.7* 13.7*  HGB 9.5* 8.2*  PLT 670* 566*  CREATININE 0.48 0.53   Estimated Creatinine Clearance: 43.5 mL/min (by C-G formula based on Cr of 0.53). No results for input(s): VANCOTROUGH, VANCOPEAK, VANCORANDOM, GENTTROUGH, GENTPEAK, GENTRANDOM, TOBRATROUGH, TOBRAPEAK, TOBRARND, AMIKACINPEAK, AMIKACINTROU, AMIKACIN in the last 72 hours.   Microbiology: Recent Results (from the past 720 hour(s))  Urine culture     Status: None   Collection Time: 10/08/15 10:41 AM  Result Value Ref Range Status   Specimen Description URINE, CATHETERIZED  Final   Special Requests NONE  Final   Culture   Final    >=100,000 COLONIES/mL YEAST Performed at [facility]    Report Status 10/18/2015 FINAL  Final  C difficile quick scan w PCR reflex     Status: None   Collection Time: 10/08/15 12:06 PM  Result Value Ref Range Status   C Diff antigen NEGATIVE NEGATIVE Final   C Diff toxin NEGATIVE NEGATIVE Final   C Diff interpretation Negative for toxigenic C. difficile  Final  Wound culture     Status: None   Collection Time: 10/09/15  1:40 PM  Result Value Ref Range Status   Specimen Description NECK  Final   Special Requests Immunocompromised  Final   Gram Stain   Final    NO WBC SEEN NO SQUAMOUS EPITHELIAL CELLS SEEN NO ORGANISMS SEEN Performed at [facility]    Culture   Final    ABUNDANT  DIPHTHEROIDS(CORYNEBACTERIUM SPECIES) Note: Standardized susceptibility testing for this organism is not available. Performed at [facility]    Report Status 10/11/2015 FINAL  Final  Culture, blood (routine x 2)     Status: None   Collection Time: 10/17/15  9:47 AM  Result Value Ref Range Status   Specimen Description BLOOD RIGHT ANTECUBITAL DRAWN BY RN  Final   Special Requests BOTTLES DRAWN AEROBIC AND ANAEROBIC [place]  Final   Culture NO GROWTH 5 DAYS  Final   Report Status 10/22/2015 FINAL  Final  Culture, blood (routine x 2)     Status: None   Collection Time: 10/17/15 10:00 AM  Result Value Ref Range Status   Specimen Description BLOOD LEFT ANTECUBITAL  Final   Special Requests BOTTLES DRAWN AEROBIC AND ANAEROBIC [place]  Final   Culture NO GROWTH 5 DAYS  Final   Report Status 10/22/2015 FINAL  Final  Urine culture     Status: None   Collection Time: 10/17/15 10:05 AM  Result Value Ref Range Status   Specimen Description URINE, CATHETERIZED  Final   Special Requests NONE  Final   Culture   Final    >=100,000 COLONIES/mL YEAST Performed at [facility]    Report Status 10/18/2015 FINAL  Final  MRSA PCR Screening     Status: None   Collection Time: 10/17/15  2:00 PM  Result Value Ref Range Status  MRSA by PCR NEGATIVE NEGATIVE Final    Comment:        The GeneXpert MRSA Assay (FDA approved for NASAL specimens only), is one component of a comprehensive MRSA colonization surveillance program. It is not intended to diagnose MRSA infection nor to guide or monitor treatment for MRSA infections.   Stool culture     Status: None   Collection Time: 10/19/15 12:00 AM  Result Value Ref Range Status   Specimen Description STOOL  Final   Special Requests NONE  Final   Culture   Final    NO SALMONELLA, SHIGELLA, CAMPYLOBACTER, YERSINIA, OR E.COLI 0157:H7 ISOLATED Performed at [facility]    Report Status 10/23/2015 FINAL  Final  C difficile  quick scan w PCR reflex     Status: None   Collection Time: 10/19/15 12:00 AM  Result Value Ref Range Status   C Diff antigen NEGATIVE NEGATIVE Final   C Diff toxin NEGATIVE NEGATIVE Final   C Diff interpretation Negative for toxigenic C. difficile  Final  Stool culture     Status: None   Collection Time: 10/23/15 11:00 AM  Result Value Ref Range Status   Specimen Description STOOL  Final   Special Requests NONE  Final   Culture   Final    ABUNDANT YEAST NO SALMONELLA, SHIGELLA, CAMPYLOBACTER, YERSINIA, OR E.COLI 0157:H7 ISOLATED Note: REDUCED NORMAL FLORA PRESENT Performed at [facility]    Report Status 10/28/2015 FINAL  Final  C difficile quick scan w PCR reflex     Status: None   Collection Time: 10/23/15 11:00 AM  Result Value Ref Range Status   C Diff antigen NEGATIVE NEGATIVE Final   C Diff toxin NEGATIVE NEGATIVE Final   C Diff interpretation Negative for toxigenic C. difficile  Final  Blood culture (routine x 2)     Status: None (Preliminary result)   Collection Time: 10/29/15  8:40 PM  Result Value Ref Range Status   Specimen Description BLOOD RIGHT ANTECUBITAL  Final   Special Requests   Final    BOTTLES DRAWN AEROBIC AND ANAEROBIC AEB 4CC ANA 3CC   Culture NO GROWTH 2 DAYS  Final   Report Status PENDING  Incomplete  Blood culture (routine x 2)     Status: None (Preliminary result)   Collection Time: 10/29/15  8:43 PM  Result Value Ref Range Status   Specimen Description BLOOD LEFT ANTECUBITAL  Final   Special Requests BOTTLES DRAWN AEROBIC AND ANAEROBIC 3CC EACH  Final   Culture NO GROWTH 2 DAYS  Final   Report Status PENDING  Incomplete    Medical History: Past Medical History  Diagnosis Date  . Essential hypertension, benign   . Type 2 diabetes mellitus ([place])   . Osteoporosis 06/2008  . Stroke [facility]) 03/2015    right hemiparesis  . History of parotid cancer     Medications:  Prescriptions prior to admission  Medication Sig Dispense Refill  Last Dose  . clotrimazole-betamethasone (LOTRISONE) cream Apply 1 application topically daily as needed (APPLY A THIN LAYER AS BARRIER AFTER CLEANING AND DRYING BUTTOCKS AREA COMPLETELY).   10/29/2015 at Unknown time  . enoxaparin (LOVENOX) 40 MG/0.4ML injection Inject 0.4 mLs (40 mg total) into the skin daily. 14 Syringe 0 10/29/2015 at 1700  . FLUoxetine (PROZAC) 10 MG capsule TAKE 1 CAPSULE BY MOUTH DAILY FOR DEPRESSION/ANXIETY (Patient taking differently: TAKE 1 CAPSULE VIA PEG DAILY FOR DEPRESSION/ANXIETY) 90 capsule 1 10/29/2015 at 900A  . furosemide (LASIX)  20 MG tablet 20 mg by PEG Tube route daily.   10/29/2015 at 900A  . levETIRAcetam (KEPPRA) 750 MG tablet TAKE 1 TABLET BY MOUTH EVERY MORNING AND 2 TABLETS BY MOUTH EVERY NIGHT AT BEDTIME (Patient taking differently: TAKE 1 TABLET VIA PEG EVERY MORNING AND 2 TABLETS BY MOUTH EVERY NIGHT AT BEDTIME) 270 tablet 3 10/29/2015 at 900A  . loperamide (IMODIUM) 2 MG capsule Take 1 capsule (2 mg total) by mouth every 4 (four) hours as needed for diarrhea or loose stools. 30 capsule 0 UNKNOWN  . metFORMIN (GLUCOPHAGE) 500 MG tablet Take one half tablet BID (Patient taking differently: 250 mg by PEG Tube route 2 (two) times daily with a meal. ) 30 tablet 5 10/29/2015 at 900A  . methylphenidate (RITALIN) 5 MG tablet Take one tablet per tube twice daily with breakfast and lunch (Patient taking differently: 5 mg by PEG Tube route 2 (two) times daily with breakfast and lunch. ) 60 tablet 0 10/29/2015 at 1200  . metoCLOPramide (REGLAN) 10 MG tablet Place 1 tablet (10 mg total) into feeding tube every 6 (six) hours as needed for nausea.   UNKNOWN  . metoprolol tartrate (LOPRESSOR) 25 MG tablet Take 25 mg by mouth 2 (two) times daily.   10/29/2015 at 900A  . nystatin (MYCOSTATIN/NYSTOP) 100000 UNIT/GM POWD Apply topically 3 (three) times daily. **APPLY A DUSTING TO RASH ON PERINEUM AND BUTTOCKS AT [facility] SHIFT   10/29/2015 at Unknown time  . ondansetron (ZOFRAN  ODT) 4 MG disintegrating tablet Take 1 tablet (4 mg total) by mouth every 8 (eight) hours as needed for nausea or vomiting. (Patient taking differently: 4 mg by PEG Tube route every 8 (eight) hours as needed for nausea or vomiting. ) 20 tablet 0 UNKNOWN at Unknown time  . pravastatin (PRAVACHOL) 40 MG tablet Take 1 tablet (40 mg total) by mouth daily. (Patient taking differently: 40 mg by PEG Tube route every evening. ) 30 tablet 12 10/28/2015 at 2100  . sodium chloride 1 G tablet 1 g by PEG Tube route 2 (two) times daily with a meal.    10/29/2015 at 1700  . warfarin (COUMADIN) 3 MG tablet 3 mg by PEG Tube route daily.   10/29/2015 at 1800  . Water For Irrigation, Sterile (FREE WATER) SOLN Place 200 mLs into feeding tube every 8 (eight) hours.   10/29/2015 at Unknown time  . amoxicillin-clavulanate (AUGMENTIN) 200-28.5 MG/5ML suspension Place 21.9 mLs (875 mg total) into feeding tube every 12 (twelve) hours. For 2 more days (Patient not taking: Reported on 10/29/2015) 100 mL 0   . NIFEdipine (PROCARDIA XL/ADALAT-CC) 60 MG 24 hr tablet Take 1 tablet (60 mg total) by mouth daily. DO NOT CRUSH (Patient not taking: Reported on 10/29/2015) 30 tablet 5 10/16/2015 at Unknown time  . Nutritional Supplements (FEEDING SUPPLEMENT, VITAL AF 1.2 CAL,) LIQD Place 1,000 mLs into feeding tube daily. (Patient not taking: Reported on 10/29/2015)     . raloxifene (EVISTA) 60 MG tablet Take 1 tablet (60 mg total) by mouth daily. (Patient not taking: Reported on 10/17/2015) 90 tablet 1 10/07/2015 at Unknown time  . senna-docusate (SENOKOT-S) 8.6-50 MG per tablet Take 2 tablets by mouth 2 (two) times daily. For constipation (Patient not taking: Reported on 10/29/2015) 100 tablet 1 10/16/2015 at Unknown time  . warfarin (COUMADIN) 2 MG tablet Take 1/2 a pill on Friday evening and take a whole pill on all other days. (Patient not taking: Reported on 10/29/2015) 30 tablet 5 10/16/2015 at  Unknown time   Assessment: 74 yo lady  to start ceftriaxone for UTI.  WBC 13.7, cultures in process She is also on coumadin for afib.  INR remains therapeutic.  Goal of Therapy:  Eradication of infection  Plan:  Rocephin 1 gm IV q24 hours F/u cultures and clinical course Cont coumadin 3 mg po daily  Thanks for allowing pharmacy to be a part of this patient's care.  Excell Seltzer, PharmD Clinical Pharmacist 10/31/2015,12:05 PM

## 2015-10-31 NOTE — Progress Notes (Signed)
Patient is off BiPAP and sleeping on 2lpm/[place] 100 saturation.

## 2015-10-31 NOTE — Clinical Social Work Note (Signed)
Clinical Social Work Assessment  Patient Details  Name: Laura Melendez MRN: [id number] Date of Birth: March 17, 1941  Date of referral:  10/31/15               Reason for consult:  Facility Placement                Permission sought to share information with:    Permission granted to share information::     Name::        Agency::     Relationship::     Contact Information:     Housing/Transportation Living arrangements for the past 2 months:  Single Family Home, [place] of Information:  Patient, Adult Children Patient Interpreter Needed:  None Criminal Activity/Legal Involvement Pertinent to Current Situation/Hospitalization:  No - Comment as needed Significant Relationships:  Adult Children, Spouse Lives with:  Facility Resident Do you feel safe going back to the place where you live?  Yes Need for family participation in patient care:  Yes (Comment)  Care giving concerns:  None identified  Facilities manager / plan:  CSW spoke with patient's son, Caralynn [place], who advised that patient has been in [facility] for three weeks.  He stated that patient is bed bound basically and has only been up to a standing position once since being in the rehab facility.  Mr. Jakeway advised that at baseline before patient's stroke she ambulated independently and completed ADLs independently. He stated that since the stroke they found a mass on her cheek, removed the mass and there have been complications since removing the mass.  Mr. Kastl stated that since the stroke, patient was able to ambulate with a [place] but this was prior to the complications from the mass removal.  He reported that family visits the patient often.  Mr. Fuchs stated that they wanted patient to return to [facility] at discharge.  CSW spoke with Kristin Bruins at [facility] who confirmed Mr. Fiqueroa history.  She stated that patient can return to the facility at discharge. She indicated that patient would not need a new FL2.     Employment status:  Retired Nurse, adult PT Recommendations:  Not assessed at this time Information / Referral to community resources:     Patient/Family's Response to care: Family is agreeable to return to [facility].  Patient/Family's Understanding of and Emotional Response to Diagnosis, Current Treatment, and Prognosis:  Family understands patient's diagnosis, treatment and prognosis.   Emotional Assessment Appearance:  Appears older than stated age Attitude/Demeanor/Rapport:   (Cooperative) Affect (typically observed):  Calm Orientation:  Oriented to Self, Oriented to Place, Oriented to Situation Alcohol / Substance use:  Not Applicable Psych involvement (Current and /or in the community):  No (Comment)  Discharge Needs  Concerns to be addressed:  Discharge Planning Concerns Readmission within the last 30 days:  Yes Current discharge risk:  None Barriers to Discharge:  No Barriers Identified   Ihor Gully, LCSW 10/31/2015, 11:40 AM [phone]

## 2015-10-31 NOTE — Progress Notes (Signed)
[facility] PROGRESS NOTE  Laura Melendez [id number] DOB: March 07, 1941 DOA: 10/29/2015 PCP: [name], MD  HPI/Brief narrative 74 y.o. female with multiple medical problem including embolic CVA with right hemiparesis, on anticoauglatuion with coumadin, hx of parotid cancer s/p excision, complicated with respiratory failure requiring intubation and pneumothorax requriing chest tube, hx of DM, HTN, PEG tube placement, diastolic CHF with EF [id number], recent admission for sepsis, brought to the ER as she was having respiratory distress. Pt was admitted for concerns of acute on chronic diastolic CHF exacerbation.  Assessment/Plan: 1. Acute on chronic diastolic CHF with acute hypoxic respiratory failure 1. CXR with findings of vascular congestion 2. Presenting BNP of >4500 3. Pt had been bipap dependent when admitted, now weaned to [place] 4. Currently on Bumex at 1mg  bid 5. Cont to follow daily weights and I/O. Pt is down over 2 kg overnight 6. Continue diuresis as tolerated and monitor renal function closely 2. Elevated troponin 1. Suspect secondary to trop lead from acute heart failure 2. Trop trending down, stable 3. Hx CVA 1. Seems stable at present 4. Hx seizures 1. Stable thus far 5. DVT prophylaxis 1. On therapeutic coumadin 6. Dysphagia 1. Nutrition has been consulted to assist in initiating tube feeds 7. UTI with possible sepsis present on admission 1. Presenting leukocytosis of 22.7k and pt confusion with UA suggestive of UTI 2. Follow urine cx 3. Started empiric rocephin  Code Status: Full Family Communication: Pt in room, family at bedside Disposition Plan: discharge when back to baseline O2 requirements and no longer in heart failure   Consultants:    Procedures:    Antibiotics: Anti-infectives    Start     Dose/Rate Route Frequency Ordered Stop   10/31/15 0815  cefTRIAXone (ROCEPHIN) 1 g in dextrose 5 % 50 mL IVPB     1 g 100 mL/hr over 30 Minutes  Intravenous Every 24 hours 10/31/15 0802        HPI/Subjective: Pt states feeling better today  Objective: Filed Vitals:   10/31/15 1217 10/31/15 1227 10/31/15 1622 10/31/15 1628  BP:      Pulse:      Temp: 97.7 F (36.5 C)   98.5 F (36.9 C)  TempSrc: Oral   Oral  Resp:      Height:      Weight:      SpO2:  100% 99%     Intake/Output Summary (Last 24 hours) at 10/31/15 1720 Last data filed at 10/31/15 1400  Gross per 24 hour  Intake  166.9 ml  Output   1525 ml  Net -1358.1 ml   Filed Weights   10/29/15 2316 10/30/15 0500 10/31/15 0500  Weight: 45.5 kg (100 lb 5 oz) 45.6 kg (100 lb 8.5 oz) 44.7 kg (98 lb 8.7 oz)    Exam:   General:  Awake, laying in bed, in nad  Cardiovascular: regular, s1, s2  Respiratory: off bipap, no wheezing, normal resp effort  Abdomen: soft, nondistended  Musculoskeletal: perfused, no clubbing   Data Reviewed: Basic Metabolic Panel:  Recent Labs Lab 10/29/15 2040 10/31/15 0444  NA 139 138  K 4.9 3.8  CL 105 104  CO2 24 28  GLUCOSE 185* 127*  BUN 33* 39*  CREATININE 0.48 0.53  CALCIUM 8.9 8.0*   Liver Function Tests:  Recent Labs Lab 10/29/15 2040  AST 25  ALT 26  ALKPHOS 62  BILITOT 0.2*  PROT 6.1*  ALBUMIN 2.1*   No results for input(s): LIPASE, AMYLASE  in the last 168 hours. No results for input(s): AMMONIA in the last 168 hours. CBC:  Recent Labs Lab 10/28/15 0715 10/29/15 2040 10/31/15 0444  WBC 13.4* 22.7* 13.7*  NEUTROABS  --  18.3*  --   HGB 8.6* 9.5* 8.2*  HCT 27.1* 30.6* 26.5*  MCV 82.6 83.2 82.8  PLT 493* 670* 566*   Cardiac Enzymes:  Recent Labs Lab 10/26/15 0825 10/29/15 2040 10/30/15 0121 10/30/15 0439 10/30/15 1114  CKTOTAL 14*  --   --   --   --   TROPONINI  --  0.10* 0.09* 0.06* 0.05*   BNP (last 3 results)  Recent Labs  10/17/15 0945 10/29/15 2040  BNP 118.0* >4500.0*    ProBNP (last 3 results) No results for input(s): PROBNP in the last 8760 hours.  CBG: No  results for input(s): GLUCAP in the last 168 hours.  Recent Results (from the past 240 hour(s))  Stool culture     Status: None   Collection Time: 10/23/15 11:00 AM  Result Value Ref Range Status   Specimen Description STOOL  Final   Special Requests NONE  Final   Culture   Final    ABUNDANT YEAST NO SALMONELLA, SHIGELLA, CAMPYLOBACTER, YERSINIA, OR E.COLI 0157:H7 ISOLATED Note: REDUCED NORMAL FLORA PRESENT Performed at [facility]    Report Status 10/28/2015 FINAL  Final  C difficile quick scan w PCR reflex     Status: None   Collection Time: 10/23/15 11:00 AM  Result Value Ref Range Status   C Diff antigen NEGATIVE NEGATIVE Final   C Diff toxin NEGATIVE NEGATIVE Final   C Diff interpretation Negative for toxigenic C. difficile  Final  Blood culture (routine x 2)     Status: None (Preliminary result)   Collection Time: 10/29/15  8:40 PM  Result Value Ref Range Status   Specimen Description BLOOD RIGHT ANTECUBITAL  Final   Special Requests   Final    BOTTLES DRAWN AEROBIC AND ANAEROBIC AEB 4CC ANA 3CC   Culture NO GROWTH 2 DAYS  Final   Report Status PENDING  Incomplete  Blood culture (routine x 2)     Status: None (Preliminary result)   Collection Time: 10/29/15  8:43 PM  Result Value Ref Range Status   Specimen Description BLOOD LEFT ANTECUBITAL  Final   Special Requests BOTTLES DRAWN AEROBIC AND ANAEROBIC 3CC EACH  Final   Culture NO GROWTH 2 DAYS  Final   Report Status PENDING  Incomplete     Studies: Dg Chest Portable 1 View  10/29/2015  CLINICAL DATA:  Respiratory distress EXAM: PORTABLE CHEST - 1 VIEW COMPARISON:  10/17/2015 FINDINGS: Cardiac shadow is stable. Lungs are well aerated bilaterally. Mild vascular congestion is noted as well as bibasilar increased density likely related atelectasis or early infiltrate. No bony abnormality is seen. IMPRESSION: Increasing bibasilar densities and mild vascular congestion. Electronically Signed   By: Inez Catalina M.D.    On: 10/29/2015 20:52    Scheduled Meds: . albuterol  2.5 mg Nebulization QID  . antiseptic oral rinse  7 mL Mouth Rinse q12n4p  . aspirin EC  81 mg Oral Daily  . bumetanide (BUMEX) IV  1 mg Intravenous Q12H  . cefTRIAXone (ROCEPHIN)  IV  1 g Intravenous Q24H  . chlorhexidine  15 mL Mouth Rinse BID  . FLUoxetine  10 mg Per Tube Daily  . free water  200 mL Per Tube 3 times per day  . levETIRAcetam  1,500 mg Oral QHS  .  levETIRAcetam  750 mg Per Tube q morning - 10a  . nystatin   Topical TID  . pravastatin  40 mg Per Tube QPM  . senna-docusate  2 tablet Oral BID  . sodium chloride  3 mL Intravenous Q12H  . warfarin  3 mg Oral Q24H  . Warfarin - Pharmacist Dosing Inpatient   Does not apply q1800   Continuous Infusions: . 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 If 7PM-7AM, please contact night-coverage at [email or web address], password [facility] 10/31/2015, 5:20 PM  LOS: 2 days

## 2015-10-31 NOTE — Progress Notes (Signed)
Initial Nutrition Assessment  Underweight  INTERVENTION:  Resume Vital 1.2 ml/hr @ goal rate 42 ml/hr.  Free water -200 ml TID and 30 ml water before and after medications.    NUTRITION DIAGNOSIS:  Predicted suboptimal oral nutrient intake related to swallow difficulty ;ongoing. Pt PEG providing 100% of nutriton.  GOAL: Provide nutrition needs based on ASPEN/SCCM guidelines    MONITOR: TF tolerance, Labs, Weight trends, Skin   REASON FOR ASSESSMENT:  Consult Enteral/tube feeding initiation and management    ASSESSMENT:  Pt from [facility]. She has acute CHF. She was d/c from [facility] after 4 day hospitalization related to aspiration pneumonia. Today she is afebrile, BP of 132/72. Nasal canula 2L/min with SpO2 100%. Denies abdominal pain. Her current weight is 98# which is wt at re-amdission to [facility]. She had brief trial with standard formula (Osmolite 1.5) on returning but immediately started having nausea and the elemental formula (Vital 1.2 was resumed). Her goal rate was changed to  31ml/hr provides 1200 kcal, 75 gr protein and 811 ml water. At goal we'll meet 95% energy and 100% protein needs.  Labs: BUN-39, glucose 127 mg/dl. WBC still elevated 13.7,  PT, INR 23.0 and 2.05 respectively.  Diet Order:  Diet NPO time specified  Skin:   surgical wound to neck- healing  Last BM:   PTA -?? (No BM noted since 11/10)  Height:   Ht Readings from Last 1 Encounters:  10/29/15 5\' 6"  (1.676 m)    Weight:   Wt Readings from Last 1 Encounters:  10/31/15 98 lb 8.7 oz (44.7 kg)    Ideal Body Weight:   59 kg  BMI:  Body mass index is 15.91 kg/(m^2).  Estimated Nutritional Needs:   Kcal:  1260   Protein:   70-75 gr  Fluid:   1.3 liters daily  EDUCATION NEEDS: none at this time      Colman Cater MS,RD,CSG,LDN Office: [phone] Pager: [phone]

## 2015-11-01 DIAGNOSIS — J9601 Acute respiratory failure with hypoxia: Secondary | ICD-10-CM

## 2015-11-01 DIAGNOSIS — I5031 Acute diastolic (congestive) heart failure: Secondary | ICD-10-CM

## 2015-11-01 LAB — GLUCOSE, CAPILLARY
GLUCOSE-CAPILLARY: 134 mg/dL — AB (ref 65–99)
GLUCOSE-CAPILLARY: 135 mg/dL — AB (ref 65–99)
Glucose-Capillary: 128 mg/dL — ABNORMAL HIGH (ref 65–99)
Glucose-Capillary: 146 mg/dL — ABNORMAL HIGH (ref 65–99)
Glucose-Capillary: 154 mg/dL — ABNORMAL HIGH (ref 65–99)
Glucose-Capillary: 92 mg/dL (ref 65–99)

## 2015-11-01 LAB — CBC
HEMATOCRIT: 24.5 % — AB (ref 36.0–46.0)
Hemoglobin: 7.6 g/dL — ABNORMAL LOW (ref 12.0–15.0)
MCH: 25.8 pg — AB (ref 26.0–34.0)
MCHC: 31 g/dL (ref 30.0–36.0)
MCV: 83.1 fL (ref 78.0–100.0)
Platelets: 523 10*3/uL — ABNORMAL HIGH (ref 150–400)
RBC: 2.95 MIL/uL — ABNORMAL LOW (ref 3.87–5.11)
RDW: 16.4 % — AB (ref 11.5–15.5)
WBC: 7.7 10*3/uL (ref 4.0–10.5)

## 2015-11-01 LAB — BASIC METABOLIC PANEL
Anion gap: 5 (ref 5–15)
BUN: 35 mg/dL — AB (ref 6–20)
CO2: 28 mmol/L (ref 22–32)
Calcium: 7.9 mg/dL — ABNORMAL LOW (ref 8.9–10.3)
Chloride: 107 mmol/L (ref 101–111)
Creatinine, Ser: 0.49 mg/dL (ref 0.44–1.00)
GFR calc Af Amer: >60 mL/min (ref >60)
GLUCOSE: 143 mg/dL — AB (ref 65–99)
POTASSIUM: 3.8 mmol/L (ref 3.5–5.1)
Sodium: 140 mmol/L (ref 135–145)

## 2015-11-01 LAB — PROTIME-INR
INR: 2.91 — ABNORMAL HIGH (ref 0.00–1.49)
PROTHROMBIN TIME: 29.9 s — AB (ref 11.6–15.2)

## 2015-11-01 MED ORDER — FUROSEMIDE 20 MG PO TABS
20.0000 mg | ORAL_TABLET | Freq: Every day | ORAL | Status: DC
  Administered 2015-11-01 – 2015-11-02 (×2): 20 mg
  Filled 2015-11-01 (×2): qty 1

## 2015-11-01 NOTE — Progress Notes (Addendum)
[facility] PROGRESS NOTE  KEELIN ZITTEL [id number] DOB: 02-01-1941 DOA: 10/29/2015 PCP: [name], MD  Assessment/Plan: Acute Hypoxemic Respiratory Failure -Due to acute CHF. -On RA currently. -See below for details.  Acute on Chronic Diastolic CHF -She is 123XX123 L negative since admission. -Appears to be near euvolemia at present. -Dc bumex and place back on home dose of lasix 20 mg daily.  Elevated Troponin -Due to acute CHF. -Flat, no plans for further cardiac work up.  H/o CVA -Stable. -Anticoagulated on coumadin.  Dysphagia -S/p PEG tube. -On enteral feedings. -Will request Speech eval per husband's request, as ST has been working with her at SNF to advance diet.  UTI -Continue rocephin pending cx data.  H/o Seizures -Not active this hospitalization.  Code Status: Full Code Family Communication: Husband at bedside updated on plan of care.  Disposition Plan: Transfer to floor; likely back to SNF in 24-48 hours.   Consultants:  None   Antibiotics:  Rocephin   Subjective: No complaints. No SOB, no CP, feels well.  Objective: Filed Vitals:   11/01/15 0605 11/01/15 0739 11/01/15 0743 11/01/15 0800  BP: 125/71   127/68  Pulse: 99 102  108  Temp:  97.9 F (36.6 C)    TempSrc:  Oral    Resp: 19 19  19   Height:      Weight:      SpO2: 98% 96% 96% 96%    Intake/Output Summary (Last 24 hours) at 11/01/15 1044 Last data filed at 11/01/15 0738  Gross per 24 hour  Intake 1241.9 ml  Output   1675 ml  Net -433.1 ml   Filed Weights   10/30/15 0500 10/31/15 0500 11/01/15 0400  Weight: 45.6 kg (100 lb 8.5 oz) 44.7 kg (98 lb 8.7 oz) 43.4 kg (95 lb 10.9 oz)    Exam:   General:  AA Ox3  Cardiovascular: RRR  Respiratory: CTA B  Abdomen: S/NT/ND/+BS/PEG in place  Extremities: no C/C/E/+pulses   Neurologic:  Non-focal  Data Reviewed: Basic Metabolic Panel:  Recent Labs Lab 10/29/15 2040 10/31/15 0444 11/01/15 0427    NA 139 138 140  K 4.9 3.8 3.8  CL 105 104 107  CO2 24 28 28   GLUCOSE 185* 127* 143*  BUN 33* 39* 35*  CREATININE 0.48 0.53 0.49  CALCIUM 8.9 8.0* 7.9*   Liver Function Tests:  Recent Labs Lab 10/29/15 2040  AST 25  ALT 26  ALKPHOS 62  BILITOT 0.2*  PROT 6.1*  ALBUMIN 2.1*   No results for input(s): LIPASE, AMYLASE in the last 168 hours. No results for input(s): AMMONIA in the last 168 hours. CBC:  Recent Labs Lab 10/28/15 0715 10/29/15 2040 10/31/15 0444 11/01/15 0427  WBC 13.4* 22.7* 13.7* 7.7  NEUTROABS  --  18.3*  --   --   HGB 8.6* 9.5* 8.2* 7.6*  HCT 27.1* 30.6* 26.5* 24.5*  MCV 82.6 83.2 82.8 83.1  PLT 493* 670* 566* 523*   Cardiac Enzymes:  Recent Labs Lab 10/26/15 0825 10/29/15 2040 10/30/15 0121 10/30/15 0439 10/30/15 1114  CKTOTAL 14*  --   --   --   --   TROPONINI  --  0.10* 0.09* 0.06* 0.05*   BNP (last 3 results)  Recent Labs  10/17/15 0945 10/29/15 2040  BNP 118.0* >4500.0*    ProBNP (last 3 results) No results for input(s): PROBNP in the last 8760 hours.  CBG:  Recent Labs Lab 11/01/15 0031 11/01/15 0435 11/01/15 [id number]  GLUCAP 154* 134* 128*    Recent Results (from the past 240 hour(s))  Stool culture     Status: None   Collection Time: 10/23/15 11:00 AM  Result Value Ref Range Status   Specimen Description STOOL  Final   Special Requests NONE  Final   Culture   Final    ABUNDANT YEAST NO SALMONELLA, SHIGELLA, CAMPYLOBACTER, YERSINIA, OR E.COLI 0157:H7 ISOLATED Note: REDUCED NORMAL FLORA PRESENT Performed at [facility]    Report Status 10/28/2015 FINAL  Final  C difficile quick scan w PCR reflex     Status: None   Collection Time: 10/23/15 11:00 AM  Result Value Ref Range Status   C Diff antigen NEGATIVE NEGATIVE Final   C Diff toxin NEGATIVE NEGATIVE Final   C Diff interpretation Negative for toxigenic C. difficile  Final  Blood culture (routine x 2)     Status: None (Preliminary result)    Collection Time: 10/29/15  8:40 PM  Result Value Ref Range Status   Specimen Description BLOOD RIGHT ANTECUBITAL  Final   Special Requests   Final    BOTTLES DRAWN AEROBIC AND ANAEROBIC AEB 4CC ANA 3CC   Culture NO GROWTH 2 DAYS  Final   Report Status PENDING  Incomplete  Blood culture (routine x 2)     Status: None (Preliminary result)   Collection Time: 10/29/15  8:43 PM  Result Value Ref Range Status   Specimen Description BLOOD LEFT ANTECUBITAL  Final   Special Requests BOTTLES DRAWN AEROBIC AND ANAEROBIC 3CC EACH  Final   Culture NO GROWTH 2 DAYS  Final   Report Status PENDING  Incomplete     Studies: No results found.  Scheduled Meds: . albuterol  2.5 mg Nebulization QID  . antiseptic oral rinse  7 mL Mouth Rinse q12n4p  . aspirin EC  81 mg Oral Daily  . bumetanide (BUMEX) IV  1 mg Intravenous Q12H  . cefTRIAXone (ROCEPHIN)  IV  1 g Intravenous Q24H  . chlorhexidine  15 mL Mouth Rinse BID  . FLUoxetine  10 mg Per Tube Daily  . free water  200 mL Per Tube 3 times per day  . levETIRAcetam  1,500 mg Oral QHS  . levETIRAcetam  750 mg Per Tube q morning - 10a  . nystatin   Topical TID  . pravastatin  40 mg Per Tube QPM  . senna-docusate  2 tablet Oral BID  . sodium chloride  3 mL Intravenous Q12H  . warfarin  3 mg Oral Q24H  . Warfarin - Pharmacist Dosing Inpatient   Does not apply q1800   Continuous Infusions: . 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 Greater than 50% of this time was spent in direct contact with the patient coordinating care.    Lelon Frohlich  [facility] Pager [phone]  If 7PM-7AM, please contact night-coverage at  [email or web address], password [facility] 11/01/2015, 10:44 AM  LOS: 3 days

## 2015-11-01 NOTE — Progress Notes (Signed)
Initial Nutrition Assessment  Underweight  INTERVENTION:  Continue Vital 1.2 ml/hr @ goal rate 42 ml/hr. Provides 1200 kcal, 75 gr protein and 811 ml water. Meets 95% energy and 100% protein needs.  Free water -200 ml TID and 30 ml water before and after medications.    NUTRITION DIAGNOSIS:  Predicted suboptimal oral nutrient intake related to swallow difficulty ;ongoing. Pt PEG providing 100% of nutriton.  GOAL: Provide nutrition needs based on ASPEN/SCCM guidelines    MONITOR: TF tolerance, Labs, Weight trends, Skin   REASON FOR ASSESSMENT:  Consult Enteral/tube feeding initiation and management    ASSESSMENT: Laura Melendez is awake and talking this morning. Denies abdominal pain and says she had a good night. Her edema in left hand and foot look much better today. Her weight is down 1 kg overnight which is desirable given her acute CHF.  Abnormal Labs: BUN-35, glucose 128 mg/dl. Hemoglobin/Hematocrit- trending down- 7.6/24.5 today. WBC improved now 7.7-WNL,  PT, INR 29.9.0 and 2.91 respectively.  Diet Order:  Diet NPO time specified  Skin:   surgical wound to neck- healing  Last BM:  2 watery stools yesterday morning and 1 this morning   Height:   Ht Readings from Last 1 Encounters:  11/01/15 5\' 6"  (1.676 m)    Weight:   Wt Readings from Last 1 Encounters:  11/01/15 95 lb 10.9 oz (43.4 kg)    Ideal Body Weight:   59 kg  BMI:  Body mass index is 15.45 kg/(m^2).  Estimated Nutritional Needs:   Kcal:  1260   Protein:   70-75 gr  Fluid:   1.3 liters daily  EDUCATION NEEDS: none at this time      Colman Cater Laura,RD,CSG,LDN Office: [phone] Pager: [phone]

## 2015-11-01 NOTE — Progress Notes (Signed)
Called report to [facility] on [phone] who will be taking care of patient in room 302

## 2015-11-01 NOTE — Progress Notes (Signed)
ANTICOAGULATION CONSULT NOTE   Pharmacy Consult for coumadin Indication: CVA  No Known Allergies  Patient Measurements: Height: 5\' 6"  (167.6 cm) Weight: 95 lb 10.9 oz (43.4 kg) IBW/kg (Calculated) : 59.3  Vital Signs: Temp: 97.9 F (36.6 C) (11/15 0739) Temp Source: Oral (11/15 0739) BP: 127/68 mmHg (11/15 0800) Pulse Rate: 108 (11/15 0800)  Labs:  Recent Labs  10/29/15 2040 10/30/15 0121 10/30/15 0439 10/30/15 1114 10/31/15 0444 11/01/15 0427  HGB 9.5*  --   --   --  8.2* 7.6*  HCT 30.6*  --   --   --  26.5* 24.5*  PLT 670*  --   --   --  566* 523*  APTT 34  --   --   --   --   --   LABPROT 15.7*  --  28.4*  --  23.0* 29.9*  INR 1.23  --  2.72*  --  2.05* 2.91*  CREATININE 0.48  --   --   --  0.53 0.49  TROPONINI 0.10* 0.09* 0.06* 0.05*  --   --     Estimated Creatinine Clearance: 42.3 mL/min (by C-G formula based on Cr of 0.49).   Medical History: Past Medical History  Diagnosis Date  . Essential hypertension, benign   . Type 2 diabetes mellitus ([place])   . Osteoporosis 06/2008  . Stroke [facility]) 03/2015    right hemiparesis  . History of parotid cancer     Medications:  Prescriptions prior to admission  Medication Sig Dispense Refill Last Dose  . clotrimazole-betamethasone (LOTRISONE) cream Apply 1 application topically daily as needed (APPLY A THIN LAYER AS BARRIER AFTER CLEANING AND DRYING BUTTOCKS AREA COMPLETELY).   10/29/2015 at Unknown time  . enoxaparin (LOVENOX) 40 MG/0.4ML injection Inject 0.4 mLs (40 mg total) into the skin daily. 14 Syringe 0 10/29/2015 at 1700  . FLUoxetine (PROZAC) 10 MG capsule TAKE 1 CAPSULE BY MOUTH DAILY FOR DEPRESSION/ANXIETY (Patient taking differently: TAKE 1 CAPSULE VIA PEG DAILY FOR DEPRESSION/ANXIETY) 90 capsule 1 10/29/2015 at 900A  . furosemide (LASIX) 20 MG tablet 20 mg by PEG Tube route daily.   10/29/2015 at 900A  . levETIRAcetam (KEPPRA) 750 MG tablet TAKE 1 TABLET BY MOUTH EVERY MORNING AND 2 TABLETS BY MOUTH  EVERY NIGHT AT BEDTIME (Patient taking differently: TAKE 1 TABLET VIA PEG EVERY MORNING AND 2 TABLETS BY MOUTH EVERY NIGHT AT BEDTIME) 270 tablet 3 10/29/2015 at 900A  . loperamide (IMODIUM) 2 MG capsule Take 1 capsule (2 mg total) by mouth every 4 (four) hours as needed for diarrhea or loose stools. 30 capsule 0 UNKNOWN  . metFORMIN (GLUCOPHAGE) 500 MG tablet Take one half tablet BID (Patient taking differently: 250 mg by PEG Tube route 2 (two) times daily with a meal. ) 30 tablet 5 10/29/2015 at 900A  . methylphenidate (RITALIN) 5 MG tablet Take one tablet per tube twice daily with breakfast and lunch (Patient taking differently: 5 mg by PEG Tube route 2 (two) times daily with breakfast and lunch. ) 60 tablet 0 10/29/2015 at 1200  . metoCLOPramide (REGLAN) 10 MG tablet Place 1 tablet (10 mg total) into feeding tube every 6 (six) hours as needed for nausea.   UNKNOWN  . metoprolol tartrate (LOPRESSOR) 25 MG tablet Take 25 mg by mouth 2 (two) times daily.   10/29/2015 at 900A  . nystatin (MYCOSTATIN/NYSTOP) 100000 UNIT/GM POWD Apply topically 3 (three) times daily. **APPLY A DUSTING TO RASH ON PERINEUM AND BUTTOCKS AT [facility] SHIFT  10/29/2015 at Unknown time  . ondansetron (ZOFRAN ODT) 4 MG disintegrating tablet Take 1 tablet (4 mg total) by mouth every 8 (eight) hours as needed for nausea or vomiting. (Patient taking differently: 4 mg by PEG Tube route every 8 (eight) hours as needed for nausea or vomiting. ) 20 tablet 0 UNKNOWN at Unknown time  . pravastatin (PRAVACHOL) 40 MG tablet Take 1 tablet (40 mg total) by mouth daily. (Patient taking differently: 40 mg by PEG Tube route every evening. ) 30 tablet 12 10/28/2015 at 2100  . sodium chloride 1 G tablet 1 g by PEG Tube route 2 (two) times daily with a meal.    10/29/2015 at 1700  . warfarin (COUMADIN) 3 MG tablet 3 mg by PEG Tube route daily.   10/29/2015 at 1800  . Water For Irrigation, Sterile (FREE WATER) SOLN Place 200 mLs into feeding tube every 8  (eight) hours.   10/29/2015 at Unknown time  . amoxicillin-clavulanate (AUGMENTIN) 200-28.5 MG/5ML suspension Place 21.9 mLs (875 mg total) into feeding tube every 12 (twelve) hours. For 2 more days (Patient not taking: Reported on 10/29/2015) 100 mL 0   . NIFEdipine (PROCARDIA XL/ADALAT-CC) 60 MG 24 hr tablet Take 1 tablet (60 mg total) by mouth daily. DO NOT CRUSH (Patient not taking: Reported on 10/29/2015) 30 tablet 5 10/16/2015 at Unknown time  . Nutritional Supplements (FEEDING SUPPLEMENT, VITAL AF 1.2 CAL,) LIQD Place 1,000 mLs into feeding tube daily. (Patient not taking: Reported on 10/29/2015)     . raloxifene (EVISTA) 60 MG tablet Take 1 tablet (60 mg total) by mouth daily. (Patient not taking: Reported on 10/17/2015) 90 tablet 1 10/07/2015 at Unknown time  . senna-docusate (SENOKOT-S) 8.6-50 MG per tablet Take 2 tablets by mouth 2 (two) times daily. For constipation (Patient not taking: Reported on 10/29/2015) 100 tablet 1 10/16/2015 at Unknown time  . warfarin (COUMADIN) 2 MG tablet Take 1/2 a pill on Friday evening and take a whole pill on all other days. (Patient not taking: Reported on 10/29/2015) 30 tablet 5 10/16/2015 at Unknown time    Assessment: 74 yo lady admitted with respiratory distress to continue coumadin for h/o CVA.  Protime has increased today by ~ 6 sec. No bleeding noted Goal of Therapy:  INR 2-3 Monitor platelets by anticoagulation protocol: Yes   Plan:  No coumadin today Daily PT/INR Monitor for bleeding complications  Thanks for allowing pharmacy to be a part of this patient's care.  Excell Seltzer, PharmD Clinical Pharmacist 11/01/2015,12:04 PM

## 2015-11-02 DIAGNOSIS — I5033 Acute on chronic diastolic (congestive) heart failure: Secondary | ICD-10-CM

## 2015-11-02 LAB — CBC
HEMATOCRIT: 25.4 % — AB (ref 36.0–46.0)
Hemoglobin: 7.7 g/dL — ABNORMAL LOW (ref 12.0–15.0)
MCH: 25.7 pg — AB (ref 26.0–34.0)
MCHC: 30.3 g/dL (ref 30.0–36.0)
MCV: 84.7 fL (ref 78.0–100.0)
Platelets: 583 10*3/uL — ABNORMAL HIGH (ref 150–400)
RBC: 3 MIL/uL — ABNORMAL LOW (ref 3.87–5.11)
RDW: 15.8 % — AB (ref 11.5–15.5)
WBC: 8.4 10*3/uL (ref 4.0–10.5)

## 2015-11-02 LAB — BASIC METABOLIC PANEL
Anion gap: 7 (ref 5–15)
BUN: 26 mg/dL — AB (ref 6–20)
CO2: 28 mmol/L (ref 22–32)
Calcium: 8.4 mg/dL — ABNORMAL LOW (ref 8.9–10.3)
Chloride: 107 mmol/L (ref 101–111)
Creatinine, Ser: 0.35 mg/dL — ABNORMAL LOW (ref 0.44–1.00)
GFR calc Af Amer: >60 mL/min (ref >60)
GLUCOSE: 138 mg/dL — AB (ref 65–99)
POTASSIUM: 3.6 mmol/L (ref 3.5–5.1)
Sodium: 142 mmol/L (ref 135–145)

## 2015-11-02 LAB — URINE CULTURE: Culture: >=100000

## 2015-11-02 LAB — PROTIME-INR
INR: 2.26 — AB (ref 0.00–1.49)
PROTHROMBIN TIME: 24.7 s — AB (ref 11.6–15.2)

## 2015-11-02 LAB — GLUCOSE, CAPILLARY
GLUCOSE-CAPILLARY: 144 mg/dL — AB (ref 65–99)
GLUCOSE-CAPILLARY: 163 mg/dL — AB (ref 65–99)
Glucose-Capillary: 135 mg/dL — ABNORMAL HIGH (ref 65–99)
Glucose-Capillary: 141 mg/dL — ABNORMAL HIGH (ref 65–99)
Glucose-Capillary: 145 mg/dL — ABNORMAL HIGH (ref 65–99)

## 2015-11-02 MED ORDER — WARFARIN SODIUM 2 MG PO TABS
2.0000 mg | ORAL_TABLET | Freq: Once | ORAL | Status: AC
  Administered 2015-11-02: 2 mg via ORAL
  Filled 2015-11-02: qty 1

## 2015-11-02 MED ORDER — WARFARIN - PHARMACIST DOSING INPATIENT
Status: DC
  Administered 2015-11-02: 16:00:00

## 2015-11-02 MED ORDER — CIPROFLOXACIN HCL 250 MG PO TABS: 250.0000 mg | ORAL_TABLET | Freq: Two times a day (BID) | ORAL | Status: DC

## 2015-11-02 MED ORDER — ASPIRIN 81 MG PO TBEC: 81.0000 mg | DELAYED_RELEASE_TABLET | Freq: Every day | ORAL | Status: DC

## 2015-11-02 NOTE — Care Management Important Message (Signed)
Important Message  Patient Details  Name: Laura Melendez MRN: [id number] Date of Birth: July 12, 1941   Medicare Important Message Given:  Yes    Sherald Barge, RN 11/02/2015, 3:39 PM

## 2015-11-02 NOTE — Evaluation (Signed)
Clinical/Bedside Swallow Evaluation Patient Details  Name: Laura Melendez MRN: [id number] Date of Birth: [birth year]  Today's Date: 11/02/2015 Time: SLP Start Time (ACUTE ONLY): [id number] SLP Stop Time (ACUTE ONLY): 1345 SLP Time Calculation (min) (ACUTE ONLY): 23 min  Past Medical History:  Past Medical History  Diagnosis Date  . Essential hypertension, benign   . Type 2 diabetes mellitus ([place])   . Osteoporosis 06/2008  . 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 History of parotid cancer    Past Surgical History:  Past Surgical History  Procedure Laterality Date  . Ankle surgery Left 2004    otif  . Abdominal hysterectomy    . Anterior and posterior repair N/A 03/11/2013    Procedure: ANTERIOR (CYSTOCELE) AND POSTERIOR REPAIR (RECTOCELE);  Surgeon: Linda Hedges, DO;  Location: [facility];  Service: Gynecology;  Laterality: N/A;  with sacrospinous ligament fixation bilateral  . Colonoscopy  2009    negative  . Cataract extraction w/phaco Right 12/27/2014    Procedure: CATARACT EXTRACTION PHACO AND INTRAOCULAR LENS PLACEMENT RIGHT EYE;  Surgeon: Tonny Branch, MD;  Location: [facility];  Service: Ophthalmology;  Laterality: Right;  CDE:10.63  . Cataract extraction w/phaco Left 01/13/2015    Procedure: CATARACT EXTRACTION PHACO AND INTRAOCULAR LENS PLACEMENT LEFT EYE;  Surgeon: Tonny Branch, MD;  Location: [facility];  Service: Ophthalmology;  Laterality: Left;  CDE:18.87  . Cholecystectomy    . Tee without cardioversion N/A 04/01/2015    Procedure: TRANSESOPHAGEAL ECHOCARDIOGRAM (TEE);  Surgeon: Sanda Klein, MD;  Location: [facility] ENDOSCOPY;  Service: Cardiovascular;  Laterality: N/A;  . Peg placement    . Parotidectomy     HPI:  74 y.o. female with multiple medical problem including embolic CVA with right hemiparesis, on anticoauglatuion with coumadin, hx of parotid cancer s/p excision, complicated with respiratory failure requiring intubation and pneumothorax requriing chest tube, hx of DM, HTN, PEG  tube placement, diastolic CHF with EF [id number], recent admission for sepsis, brought to the ER as she was having respiratory distress. Pt was admitted for concerns of acute on chronic diastolic CHF exacerbation. Pt was receiving trials of puree and HTL with SLP only at [facility]. Family requesting continued trials in acute setting so SLP consulted.   Assessment / Plan / Recommendation Clinical Impression  Pt with known oropharyngeal dysphagia with objective testing completed 10/11/2015 with recommendation for trials of puree and HTL with SLP only and continued PEG feedings. Pt's husband was hoping that SLP could see pt while in acute setting for continued po trials. Acute setting offers limited availability for swallow treatment, however trials initiated this date. Pt agreeable to small amount of teaspoon presented sherbet. Pt with breathy vocal quality prior to po administration and requred moderate verbal cues for increased intensity. Pt also cued to move lips and tongue and implement effortful/hard swallow. Pt with x1 episode of dry cough, otherwise she was cued to clear throat and repeat swallow after every bite. Pt declined further trials due to fatigue. Recommend continue with PEG feedings and resume dysphagia therapy and po trials with treating SLP at [facility]. Pt likely to transfer back to [facility] today or tomorrow per staff.     Aspiration Risk  Moderate aspiration risk    Diet Recommendation     Medication Administration: Via alternative means    Other  Recommendations Oral Care Recommendations: Oral care QID;Staff/trained caregiver to provide oral care   Follow up Recommendations  Skilled Nursing facility    Frequency and Duration  Swallow Study   General Date of Onset: 09/19/15 HPI: 74 y.o. female with multiple medical problem including embolic CVA with right hemiparesis, on anticoauglatuion with coumadin, hx of parotid cancer s/p excision, complicated with respiratory failure requiring  intubation and pneumothorax requriing chest tube, hx of DM, HTN, PEG tube placement, diastolic CHF with EF [id number], recent admission for sepsis, brought to the ER as she was having respiratory distress. Pt was admitted for concerns of acute on chronic diastolic CHF exacerbation. Pt was receiving trials of puree and HTL with SLP only at [facility]. Family requesting continued trials in acute setting so SLP consulted. Type of Study: Bedside Swallow Evaluation Previous Swallow Assessment: MBSS Oct 2016 trials puree and HTL with SLP; continue PEG  Diet Prior to this Study: NPO;PEG tube Temperature Spikes Noted: No Respiratory Status: Room air History of Recent Intubation: No Behavior/Cognition: Alert;Cooperative;Lethargic/Drowsy Oral Cavity Assessment: Dried secretions Oral Care Completed by SLP: Yes Oral Cavity - Dentition: Edentulous;Missing dentition;Other (Comment) Vision: Functional for self-feeding Self-Feeding Abilities: Needs assist Patient Positioning: Upright in bed Baseline Vocal Quality: Breathy Volitional Cough: Weak Volitional Swallow: Able to elicit    Oral/Motor/Sensory Function Overall Oral Motor/Sensory Function: Mild impairment (global weakness)   Ice Chips Ice chips: Not tested   Thin Liquid Thin Liquid: Not tested    Nectar Thick Nectar Thick Liquid: Impaired Presentation: Spoon Oral Phase Impairments: Reduced labial seal Pharyngeal Phase Impairments: Suspected delayed Swallow   Honey Thick Honey Thick Liquid: Not tested   Puree Puree: Not tested   Solid Solid: Not tested      Thank you,  Laura Melendez, [place]  PORTER,DABNEY 11/02/2015,2:58 PM

## 2015-11-02 NOTE — Progress Notes (Signed)
Pink foam to left heel, foam boot to right heel, tolerating tube feeding, incontinent of watery brown stool, husband at bedside.

## 2015-11-02 NOTE — Discharge Summary (Signed)
Physician Discharge Summary  Laura Melendez [id number] DOB: 1941/11/24 DOA: 10/29/2015  PCP: [name], MD  Admit date: 10/29/2015 Discharge date: 11/02/2015  Time spent: 45 minutes  Recommendations for Outpatient Follow-up:  -Will be discharged to SNF today.   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 Marin Comment 11/12: Laura Melendez is an 74 y.o. female with multiple medical problem including embolic CVA with right hemiparesis, on anticoauglatuion with coumadin, hx of parotid cancer s/p excision, complicated with respiratory failure requiring intubation and pneumothorax requriing chest tube, hx of DM, HTN, PEG tube placement, diastolic CHF with EF [id number], recent admission for sepsis, brought to the ER as she was having respiratory distress. Evaluation included a CXR showing vascular congestion, with no infiltrate, BNP greater than 4500, and troponin was elevated at 0.1. Her EKG showed ST with no acute ST T changes, and her WBC was elevated to 22K, with Hb of 18 g per dL, Her Cr was 0.48. She was placed on Bipap, and given IV Lasix of 40mg , and improved. Hospitalist was asked to admit her for respiratory failure, likely from diastolic CHF. When I see her, on Bipap, she was in no distress, alert, and follow simple verbal command.   Hospital Course:   Acute Hypoxemic Respiratory Failure -Due to acute CHF. -On RA currently. -See below for details.  Acute on Chronic  Diastolic CHF -She is [id number] L negative since admission. -Appears to be near euvolemia at present. -Dc bumex and place back on home dose of lasix 20 mg daily.  Elevated Troponin -Due to acute CHF. -Flat, no plans for further cardiac work up.  H/o CVA -Stable. -Anticoagulated on coumadin.  Dysphagia -S/p PEG tube. -On enteral feedings. -Will request Speech eval per husband's request, as ST has been working with her at SNF to advance diet. (ST is recommending continued PEG feedings and continued follow up with ST at SNF).  UTI -Cx with pansensitive E coli. -Cipro for 3 more days.  H/o Seizures -Not active this hospitalization.    Procedures:  None   Consultations:  None  Discharge Instructions  Discharge Instructions    Diet - low sodium heart healthy    Complete by:  As directed      Increase activity slowly    Complete by:  As directed             Medication List    STOP taking these medications        amoxicillin-clavulanate 200-28.5 MG/5ML suspension  Commonly known as:  AUGMENTIN     enoxaparin 40 MG/0.4ML injection  Commonly known as:  LOVENOX     NIFEdipine 60 MG 24 hr tablet  Commonly known as:  PROCARDIA XL/ADALAT-CC     raloxifene 60 MG tablet  Commonly known as:  EVISTA     senna-docusate 8.6-50 MG tablet  Commonly known as:  Senokot-S     sodium chloride 1 G tablet  TAKE these medications        aspirin 81 MG EC tablet  Take 1 tablet (81 mg total) by mouth daily.     ciprofloxacin 250 MG tablet  Commonly known as:  CIPRO  Place 1 tablet (250 mg total) into feeding tube 2 (two) times daily.     clotrimazole-betamethasone cream  Commonly known as:  LOTRISONE  Apply 1 application topically daily as needed (APPLY A THIN LAYER AS BARRIER AFTER CLEANING AND DRYING BUTTOCKS AREA COMPLETELY).     feeding supplement (VITAL AF 1.2 CAL) Liqd  Place 1,000 mLs into feeding tube daily.     FLUoxetine 10 MG capsule  Commonly known as:   PROZAC  TAKE 1 CAPSULE BY MOUTH DAILY FOR DEPRESSION/ANXIETY     free water Soln  Place 200 mLs into feeding tube every 8 (eight) hours.     furosemide 20 MG tablet  Commonly known as:  LASIX  20 mg by PEG Tube route daily.     levETIRAcetam 750 MG tablet  Commonly known as:  KEPPRA  TAKE 1 TABLET BY MOUTH EVERY MORNING AND 2 TABLETS BY MOUTH EVERY NIGHT AT BEDTIME     loperamide 2 MG capsule  Commonly known as:  IMODIUM  Take 1 capsule (2 mg total) by mouth every 4 (four) hours as needed for diarrhea or loose stools.     metFORMIN 500 MG tablet  Commonly known as:  GLUCOPHAGE  Take one half tablet BID     methylphenidate 5 MG tablet  Commonly known as:  RITALIN  Take one tablet per tube twice daily with breakfast and lunch     metoCLOPramide 10 MG tablet  Commonly known as:  REGLAN  Place 1 tablet (10 mg total) into feeding tube every 6 (six) hours as needed for nausea.     metoprolol tartrate 25 MG tablet  Commonly known as:  LOPRESSOR  Take 25 mg by mouth 2 (two) times daily.     nystatin 100000 UNIT/GM Powd  Apply topically 3 (three) times daily. **APPLY A DUSTING TO RASH ON PERINEUM AND BUTTOCKS AT EACH SHIFT     ondansetron 4 MG disintegrating tablet  Commonly known as:  ZOFRAN ODT  Take 1 tablet (4 mg total) by mouth every 8 (eight) hours as needed for nausea or vomiting.     pravastatin 40 MG tablet  Commonly known as:  PRAVACHOL  Take 1 tablet (40 mg total) by mouth daily.     warfarin 3 MG tablet  Commonly known as:  COUMADIN  3 mg by PEG Tube route daily.       No Known Allergies    The results of significant diagnostics from this hospitalization (including imaging, microbiology, ancillary and laboratory) are listed below for reference.    Significant Diagnostic Studies: Dg Chest 1 View  10/13/2015  CLINICAL DATA:  Cough, aspiration pneumonia, type II diabetes mellitus, essential hypertension EXAM: CHEST 1 VIEW COMPARISON:  10/08/2015 FINDINGS:  Enlargement of cardiac silhouette. Atherosclerotic calcification aorta. Mediastinal contours and pulmonary vascularity normal. Question minimal atelectasis at lung bases. Minimal central peribronchial thickening. No infiltrate, pleural effusion or pneumothorax. Bones demineralized. Retained contrast in abdomen. IMPRESSION: Minimal bronchitic changes and basilar atelectasis. Electronically Signed   By: Lavonia Dana M.D.   On: 10/13/2015 13:15   Mr Brain Wo Contrast  10/13/2015  CLINICAL DATA:  Cerebral infarct two-view unspecified mechanism. Swelling dysfunction. EXAM: MRI HEAD WITHOUT CONTRAST TECHNIQUE: Multiplanar, multiecho pulse sequences of the brain and surrounding  structures were obtained without intravenous contrast. COMPARISON:  CT of the head and temporal bone 06/27/2015. MRI brain 03/30/2015. FINDINGS: The diffusion-weighted images demonstrate no evidence for acute infarct. Atrophy and diffuse confluent T2 changes are evident bilaterally. A remote lacunar infarct in the left centrum semi ovale is stable. Remote thymic infarcts are again seen, more prominent on the right. A remote left cerebellar infarct is stable. Brainstem demonstrates mild T2 change, similar to the prior exam. Flow is present in the major intracranial arteries. Bilateral lens replacements are noted. The globes and orbits are otherwise intact. Paranasal sinuses are clear. There is some fluid in the mastoid air cells bilaterally. No obstructing nasopharyngeal lesion is present. IMPRESSION: 1. No acute intracranial abnormality or significant interval change. 2. Stable atrophy and diffuse white matter disease. 3. Remote lacunar infarcts in the thalami bilaterally, worse on the right, and the left cerebellum. Electronically Signed   By: San Morelle M.D.   On: 10/13/2015 15:33   Dg Op Swallowing Func-medicare/speech Path  10/11/2015  [place], [place], [address]  Phone: [phone]   Fax:  [phone] Modified Barium Swallow Patient Details  Name: JENA WINEBERG MRN: [id number] Date of Birth: May 15, 1941 No Data Recorded Encounter Date: 10/11/2015       End of Session - 10/11/15 1650     Visit Number  1    Number of Visits  1    Authorization Type  [employer] medicare    SLP Start Time  [id number]    SLP Stop Time   1552    SLP Time Calculation (min)  19 min    Activity Tolerance  Patient limited by fatigue      Past Medical History  Diagnosis  Date  .  Essential hypertension, benign    .  Type 2 diabetes mellitus ([place])    .  Osteoporosis  06/2008  .  Stroke [facility])      Past Surgical History  Procedure  Laterality  Date  .  Ankle surgery  Left  2004      otif  .  Abdominal hysterectomy      .  Anterior and posterior repair  N/A  03/11/2013      Procedure: ANTERIOR (CYSTOCELE) AND POSTERIOR REPAIR (RECTOCELE);  Surgeon: Linda Hedges, DO;  Location: [facility];  Service: Gynecology;  Laterality: N/A;  with sacrospinous ligament fixation bilateral  .  Colonoscopy    2009      negative  .  Cataract extraction w/phaco  Right  12/27/2014      Procedure: CATARACT EXTRACTION PHACO AND INTRAOCULAR LENS PLACEMENT RIGHT EYE;  Surgeon: Tonny Branch, MD;  Location: [facility];  Service: Ophthalmology;  Laterality: Right;  CDE:10.63  .  Cataract extraction w/phaco  Left  01/13/2015      Procedure: CATARACT EXTRACTION PHACO AND INTRAOCULAR LENS PLACEMENT LEFT EYE;  Surgeon: Tonny Branch, MD;  Location: [facility];  Service: Ophthalmology;  Laterality: Left;  CDE:18.87  .  Cholecystectomy      .  Tee without cardioversion  N/A  04/01/2015      Procedure: TRANSESOPHAGEAL ECHOCARDIOGRAM (TEE);  Surgeon: Sanda Klein, MD;  Location: [facility] ENDOSCOPY;  Service: Cardiovascular;  Laterality: N/A;  There were no vitals filed for this visit. Visit Diagnosis: Dysphagia       Subjective Assessment - 10/11/15 1619     Subjective  Alert, pleasant upright in haustead chair, appears very weak    Currently in Pain?  No/denies           General - 10/11/15 1621     General Information    Date of Onset  09/19/15    Other Pertinent Information  Laura Melendez is a 74 y.o. female with a medical hx of HTN, Type 2 DM, stroke, who had an elective surgery for left superficial parotidectomy with facial nerve dissection suspicious for malignancy at [facility]. went to the operating room where she underwent a left superficial parotidectomy with facial nerve dissection. On postoperative day one she developed a left neck hematoma and had to go back for surgical incision and evacuation on 10/4. The wound from the neck hematoma was left open and treated with wet to dry saline moist Kerlix dressings and the wounds incision to heal by secondary intention. On 10/4 she went into respiratory distress and required intubation on the floor. It was felt that she had reactive airway swelling in the setting of the hematoma and postoperative swelling. She further developed a right pneumothorax requiring a chest tube removed on 10/10. She was also excessively extubated on 10/10. She then developed a pneumonia with respiratory culture growing Escherichia coli and Klebsiella and she completed a 7 day course of antibiotics ending on 10/14. An x-ray on 10/16 showed pulmonary edema and she was diureses with Lasix. Pt with PEG placement 10-03-15. ST to assess current swallow function.     Type of Study  Other (Comment) MBSS    Reason for Referral  Objectively evaluate swallowing function    Previous Swallow Assessment  MBS April 2016: purre, thin recommendations     Diet Prior to this Study  NPO    Temperature Spikes Noted  N/A unknown     Respiratory Status  Room air    History of Recent Intubation  Yes    Length of Intubations (days)  6 days    Date extubated  09/26/15    Behavior/Cognition  Alert    Oral Cavity - Dentition  Edentulous;Missing dentition;Other (Comment) primarily edentulous    Oral Motor / Sensory Function  Impaired    Self-Feeding Abilities  Needs assist    Patient  Positioning  Upright in chair/Tumbleform    Baseline Vocal Quality  Low vocal intensity    Volitional Cough  Weak    Anatomy  -- surgical removal of parotid gland     Pharyngeal Secretions  Not observed secondary MBS          Oral Preparation/Oral Phase - 10/11/15 1626     Oral Preparation/Oral Phase    Oral Phase  Impaired    Oral - Honey    Oral - Honey Teaspoon  Lingual pumping;Weak ligual manipulation;Imparied mastication;Decreased bolus cohesion;Delayed A-P transit;Oral residue    Oral - Honey Cup  Weak ligual manipulation;Lingual pumping;Imparied mastication;Decreased bolus cohesion;Delayed A-P transit;Oral residue    Oral - Nectar    Oral - Nectar Teaspoon  Weak ligual manipulation;Lingual pumping;Imparied mastication;Oral residue;Delayed A-P transit;Decreased bolus cohesion    Oral - Thin    Oral - Thin Teaspoon  Weak ligual manipulation;Lingual pumping;Imparied mastication;Decreased bolus cohesion;Delayed A-P transit;Oral residue    Oral - Thin Cup  Weak ligual manipulation;Lingual pumping;Imparied mastication;Delayed A-P transit;Decreased bolus cohesion;Oral residue    Oral - Solids    Oral - Puree  Weak ligual manipulation;Imparied mastication;Delayed A-P transit;Decreased bolus cohesion;Oral residue    Electrical stimulation - Oral Phase    Was Electrical Stimulation Used  No          Pharyngeal  Phase - 10-26-2015 1630     Pharyngeal Phase    Pharyngeal Phase  Impaired    Pharyngeal - Honey    Pharyngeal- Honey Teaspoon  Swallow initiation at vallecula;Delayed swallow initiation    Pharyngeal- Honey Cup  Delayed swallow initiation;Pharyngeal residue - valleculae;Swallow initiation at vallecula    Pharyngeal - Nectar    Pharyngeal- Nectar Teaspoon  Delayed swallow initiation;Penetration/Aspiration during swallow;Penetration/Apiration after swallow 5: material enters airway contacts VC and is not ejected    Pharyngeal - Thin    Pharyngeal- Thin Teaspoon  Delayed swallow initiation;Swallow initiation at  pyriform sinus;Lateral channel residue;Pharyngeal residue - pyriform    Pharyngeal - Solids    Pharyngeal- Puree  Delayed swallow initiation;Penetration/Aspiration during swallow;Penetration/Apiration after swallow;Swallow initiation at vallecula 7; material passes below TVC, and is not ejected despite effort     Electrical Stimulation - Pharyngeal Phase    Was Electrical Stimulation Used  No          Plan - 10/26/15 1655     Clinical Impression Statement  Pt presents with moderate to severe sensory motor oropharygneal dysphagia of multi factorial etiology (post left superficial parotidectomy with facial nerve dissection, decreased respiratory support inclusive of 6 day intubation, extubation date noted 09-26-15, and generalized weakness including oral and pharyngeal musculature). Pt with weak lingual manipulation of all bolus consistencies leading to premature spillage and severe delay in swallow initiation. Aspiration evidenced with thin liquids via cup sip which was sensed but not effectively cleared by weak reflexive cough. Pt with oral residuals of thin liquids which later pentrated to the level of the true vocal cords, further compromising patients airway protection after the swallow. Pt with deep penetration of nectar thick liquids by teaspoon. No penetration or aspiration of honey thick liquids or puree consistencies. However larger honey thick liquid sip size yielded vallecular residuals. Recommend puree diet and honey thick liquids via teaspoon with SLP only and medicines via alternative means. Okay for treating SLP to clinically upgrade conservatively as tolerated given anticipated recovery. Pt is very high risk for aspiration given recent severity of surgical procedures and medical comorbidities further compromising strength and endurance.           G-Codes - 10-26-15 1651     Functional Assessment Tool Used  MBSS; clinical judgement    Functional Limitations  Swallowing    Swallow Current Status [id number])   At least 80 percent but less than 100 percent impaired, limited or restricted    Swallow Goal Status [id number])  At least 40 percent but less than 60 percent impaired, limited or restricted    Swallow Discharge Status [phone])  At least 80 percent but less than 100 percent impaired, limited or restricted          Recommendations/Treatment - 2015-10-26 1646     Swallow Evaluation Recommendations    Recommended Consults  Other (Comment) SLP follow at SNF    SLP Diet Recommendations  Dysphagia 1 (Puree);Honey honey thick liquids via tsp with SLP only     Liquid Administration via  Spoon    Medication Administration  Via alternative means    Supervision  Full supervision/cueing for compensatory strategies;Staff to assist with self feeding    Compensations  Multiple dry swallows after each bite/sip;Minimize environmental distractions;Feed no longer than 30 minutes;Slow rate    Postural Changes  Seated upright at 90 degrees;Remain semi-upright after after feeds/meals (Comment)          Prognosis - 10-26-2015 1649  Prognosis    Prognosis for Safe Diet Advancement  Fair    Barriers to Reach Goals  Severity of deficits    Individuals Consulted    Consulted and Agree with Results and Recommendations  Patient    Report Sent to   Primary SLP    Problem List Patient Active Problem List    Diagnosis  Date Noted  .  Aortic mural thrombus ([place])  05/06/2015  .  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  Dysphagia due to recent stroke  04/04/2015  .  Right hemiparesis ([place])  04/04/2015  .  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  Aortic thrombus ([place])    .  Stroke with cerebral ischemia ([place])    .  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  DM (diabetes mellitus) ([place])  03/23/2014  .  Osteoporosis, unspecified  03/23/2014  .  Dyslipidemia  03/23/2014  .  Tobacco abuse  03/23/2014  .  Syncope  01/01/2014  .  Essential hypertension, benign  01/01/2014  Arvil Chaco MA, [facility] Speech Language Pathologist  Levi Aland 10/11/2015, 5:28 PM [place] [address] [place], [place], [address] Phone: [phone]   Fax:  [phone] Name: PATREECE COSTEN MRN: [id number] Date of Birth: 1941-08-02            Close Encounter  click to open   Close Encounter   Scroll Back to Top CLINICAL DATA:  Dysphagia. EXAM: MODIFIED BARIUM SWALLOW TECHNIQUE: Different consistencies of barium were administered orally to the patient by the Speech Pathologist. Imaging of the pharynx was performed in the lateral projection. FLUOROSCOPY TIME:  Fluoroscopy Time:  3 minutes 24 seconds. Number of Acquired Images:  1. COMPARISON:  None. FINDINGS: Thin liquid- tracheal penetration and aspiration was noted. Nectar thick liquid- tracheal penetration was noted. Honey- within normal limits. Pure- delayed oral transit. No aspiration or penetration was noted. Cracker-not administered. Pure with cracker- not administered. Barium tablet -  not administered. IMPRESSION: Tracheal penetration and aspiration was noted with thin liquid. Tracheal penetration was noted with nectar thick liquid. Please refer to the Speech Pathologists report for complete details and recommendations. Electronically Signed   By: Marijo Conception, M.D.   On: 10/11/2015 16:02   Dg Chest Portable 1 View  10/29/2015  CLINICAL DATA:  Respiratory distress EXAM: PORTABLE CHEST - 1 VIEW COMPARISON:  10/17/2015 FINDINGS: Cardiac shadow is stable. Lungs are well aerated bilaterally. Mild vascular congestion is noted as well as bibasilar increased density likely related atelectasis or early infiltrate. No bony abnormality is seen. IMPRESSION: Increasing bibasilar densities and mild vascular congestion. Electronically Signed   By: Inez Catalina M.D.   On: 10/29/2015 20:52   Dg Abd Acute W/chest  10/17/2015  CLINICAL DATA:  Nausea and vomiting, possible aspiration. EXAM: DG ABDOMEN ACUTE W/ 1V CHEST  COMPARISON:  Chest x-ray of October 13, 2015 FINDINGS: Chest x-ray: The lungs are adequately inflated. There are increased densities in the lower lobes bilaterally. These are more conspicuous than in the past. The heart is top-normal in size but stable. The pulmonary vascularity is normal. The mediastinum is normal in width. Within the abdomen there is a moderate amount of contrast noted throughout the normal caliber colon. A gastrostomy tube is in place. The stomach and small bowel do not appear abnormally distended. There is gentle levocurvature of the mid lumbar spine.  IMPRESSION: 1. Increased density at both lung bases worrisome for the clinically suspected aspiration and development of atelectasis or pneumonia. Stable enlargement cardiac silhouette without pulmonary edema. 2. The bowel gas pattern is within the limits of normal with exception of the presence of a moderate amount of contrast in the normal calibered colon. This contrast may be residual from previous studies. I do not have history of recent oral contrast administration. Electronically Signed   By: David  Martinique M.D.   On: 10/17/2015 09:50   Dg Abd Acute W/chest  10/08/2015  CLINICAL DATA:  Nausea and vomiting, recent parotid surgery with postoperative hemorrhage and pneumonia EXAM: DG ABDOMEN ACUTE W/ 1V CHEST COMPARISON:  01/30/2015 FINDINGS: Rotated to the RIGHT. Normal heart size, mediastinal contours, and pulmonary vascularity. Atherosclerotic calcification aorta. Lungs clear. No pleural effusion or pneumothorax. Gastrostomy tube LEFT upper quadrant. Scattered atherosclerotic calcifications. Normal bowel gas pattern. No bowel dilatation, bowel wall thickening, or free intraperitoneal air. Bones demineralized with levoconvex lumbar scoliosis. IMPRESSION: No acute abnormalities. Electronically Signed   By: Lavonia Dana M.D.   On: 10/08/2015 10:06    Microbiology: Recent Results (from the past 240 hour(s))  Blood culture (routine x 2)      Status: None (Preliminary result)   Collection Time: 10/29/15  8:40 PM  Result Value Ref Range Status   Specimen Description BLOOD RIGHT ANTECUBITAL  Final   Special Requests   Final    BOTTLES DRAWN AEROBIC AND ANAEROBIC AEB=4CC ANA=3CC   Culture NO GROWTH 4 DAYS  Final   Report Status PENDING  Incomplete  Blood culture (routine x 2)     Status: None (Preliminary result)   Collection Time: 10/29/15  8:43 PM  Result Value Ref Range Status   Specimen Description BLOOD LEFT ANTECUBITAL  Final   Special Requests BOTTLES DRAWN AEROBIC AND ANAEROBIC 3CC EACH  Final   Culture NO GROWTH 4 DAYS  Final   Report Status PENDING  Incomplete  Culture, Urine     Status: None   Collection Time: 10/31/15 10:33 AM  Result Value Ref Range Status   Specimen Description URINE, CATHETERIZED  Final   Special Requests NONE  Final   Culture   Final    >=100,000 COLONIES/mL ESCHERICHIA COLI Performed at [facility]    Report Status 11/02/2015 FINAL  Final   Organism ID, Bacteria ESCHERICHIA COLI  Final      Susceptibility   Escherichia coli - MIC*    AMPICILLIN <=2 SENSITIVE Sensitive     CEFAZOLIN <=4 SENSITIVE Sensitive     CEFTRIAXONE <=1 SENSITIVE Sensitive     CIPROFLOXACIN <=0.25 SENSITIVE Sensitive     GENTAMICIN <=1 SENSITIVE Sensitive     IMIPENEM <=0.25 SENSITIVE Sensitive     NITROFURANTOIN <=16 SENSITIVE Sensitive     TRIMETH/SULFA <=20 SENSITIVE Sensitive     AMPICILLIN/SULBACTAM <=2 SENSITIVE Sensitive     PIP/TAZO <=4 SENSITIVE Sensitive     * >=100,000 COLONIES/mL ESCHERICHIA COLI     Labs: Basic Metabolic Panel:  Recent Labs Lab 10/29/15 2040 10/31/15 0444 11/01/15 0427 11/02/15 0614  NA 139 138 140 142  K 4.9 3.8 3.8 3.6  CL 105 104 107 107  CO2 24 28 28 28   GLUCOSE 185* 127* 143* 138*  BUN 33* 39* 35* 26*  CREATININE 0.48 0.53 0.49 0.35*  CALCIUM 8.9 8.0* 7.9* 8.4*   Liver Function Tests:  Recent Labs Lab 10/29/15 2040  AST 25  ALT 26  ALKPHOS 62  BILITOT 0.2*  PROT 6.1*  ALBUMIN 2.1*   No results for input(s): LIPASE, AMYLASE in the last 168 hours. No results for input(s): AMMONIA in the last 168 hours. CBC:  Recent Labs Lab 10/28/15 0715 10/29/15 2040 10/31/15 0444 11/01/15 0427 11/02/15 0614  WBC 13.4* 22.7* 13.7* 7.7 8.4  NEUTROABS  --  18.3*  --   --   --   HGB 8.6* 9.5* 8.2* 7.6* 7.7*  HCT 27.1* 30.6* 26.5* 24.5* 25.4*  MCV 82.6 83.2 82.8 83.1 84.7  PLT 493* 670* 566* 523* 583*   Cardiac Enzymes:  Recent Labs Lab 10/29/15 2040 10/30/15 0121 10/30/15 0439 10/30/15 1114  TROPONINI 0.10* 0.09* 0.06* 0.05*   BNP: BNP (last 3 results)  Recent Labs  10/17/15 0945 10/29/15 2040  BNP 118.0* >4500.0*    ProBNP (last 3 results) No results for input(s): PROBNP in the last 8760 hours.  CBG:  Recent Labs Lab 11/01/15 2009 11/02/15 0014 11/02/15 0455 11/02/15 0757 11/02/15 1110  GLUCAP 135* 141* 145* 144* 163*       Signed:  HERNANDEZ ACOSTA,ESTELA  [facility] Pager: [phone] 11/02/2015, 3:38 PM

## 2015-11-02 NOTE — Clinical Social Work Note (Signed)
Pt d/c today back to [facility]. Pt, son, and facility aware and agreeable. CSW left voicemail for pt's husband and daughter. Will transfer with staff. [employer] authorization received per [facility].   Benay Pike, [place]

## 2015-11-02 NOTE — Progress Notes (Signed)
Central telemetry notified patient returning to [facility], telemetry removed.  IV access removed.  Tube feeding stopped, tube flushed and clamped.

## 2015-11-02 NOTE — Progress Notes (Signed)
Received phone call from [facility] center, bed 155 ready, report given, family waiting patient at [facility], transported via bed by staff to [facility].  Stable at discharge.

## 2015-11-02 NOTE — Progress Notes (Signed)
ANTICOAGULATION CONSULT NOTE   Pharmacy Consult for coumadin Indication: CVA  No Known Allergies  Patient Measurements: Height: 5\' 6"  (167.6 cm) Weight: 94 lb 9.2 oz (42.9 kg) IBW/kg (Calculated) : 59.3  Vital Signs: Temp: 97.4 F (36.3 C) (11/16 0600) Temp Source: Oral (11/16 0600) BP: 148/65 mmHg (11/16 0600) Pulse Rate: 110 (11/16 0600)  Labs:  Recent Labs  10/30/15 1114  10/31/15 0444 11/01/15 0427 11/02/15 0614  HGB  --   < > 8.2* 7.6* 7.7*  HCT  --   --  26.5* 24.5* 25.4*  PLT  --   --  566* 523* 583*  LABPROT  --   --  23.0* 29.9* 24.7*  INR  --   --  2.05* 2.91* 2.26*  CREATININE  --   --  0.53 0.49 0.35*  TROPONINI 0.05*  --   --   --   --   < > = values in this interval not displayed.  Estimated Creatinine Clearance: 41.8 mL/min (by C-G formula based on Cr of 0.35).  Medical History: Past Medical History  Diagnosis Date  . Essential hypertension, benign   . Type 2 diabetes mellitus ([place])   . Osteoporosis 06/2008  . 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 History of parotid cancer    Medications:  Prescriptions prior to admission  Medication Sig Dispense Refill Last Dose  . clotrimazole-betamethasone (LOTRISONE) cream Apply 1 application topically daily as needed (APPLY A THIN LAYER AS BARRIER AFTER CLEANING AND DRYING BUTTOCKS AREA COMPLETELY).   10/29/2015 at Unknown time  . enoxaparin (LOVENOX) 40 MG/0.4ML injection Inject 0.4 mLs (40 mg total) into the skin daily. 14 Syringe 0 10/29/2015 at 1700  . FLUoxetine (PROZAC) 10 MG capsule TAKE 1 CAPSULE BY MOUTH DAILY FOR DEPRESSION/ANXIETY (Patient taking differently: TAKE 1 CAPSULE VIA PEG DAILY FOR DEPRESSION/ANXIETY) 90 capsule 1 10/29/2015 at 900A  . furosemide (LASIX) 20 MG tablet 20 mg by PEG Tube route daily.   10/29/2015 at 900A  . levETIRAcetam (KEPPRA) 750 MG tablet TAKE 1 TABLET BY MOUTH EVERY MORNING AND 2 TABLETS BY MOUTH EVERY NIGHT AT BEDTIME (Patient taking differently: TAKE 1 TABLET  VIA PEG EVERY MORNING AND 2 TABLETS BY MOUTH EVERY NIGHT AT BEDTIME) 270 tablet 3 10/29/2015 at 900A  . loperamide (IMODIUM) 2 MG capsule Take 1 capsule (2 mg total) by mouth every 4 (four) hours as needed for diarrhea or loose stools. 30 capsule 0 UNKNOWN  . metFORMIN (GLUCOPHAGE) 500 MG tablet Take one half tablet BID (Patient taking differently: 250 mg by PEG Tube route 2 (two) times daily with a meal. ) 30 tablet 5 10/29/2015 at 900A  . methylphenidate (RITALIN) 5 MG tablet Take one tablet per tube twice daily with breakfast and lunch (Patient taking differently: 5 mg by PEG Tube route 2 (two) times daily with breakfast and lunch. ) 60 tablet 0 10/29/2015 at 1200  . metoCLOPramide (REGLAN) 10 MG tablet Place 1 tablet (10 mg total) into feeding tube every 6 (six) hours as needed for nausea.   UNKNOWN  . metoprolol tartrate (LOPRESSOR) 25 MG tablet Take 25 mg by mouth 2 (two) times daily.   10/29/2015 at 900A  . nystatin (MYCOSTATIN/NYSTOP) 100000 UNIT/GM POWD Apply topically 3 (three) times daily. **APPLY A DUSTING TO RASH ON PERINEUM AND BUTTOCKS AT [facility] SHIFT   10/29/2015 at Unknown time  . ondansetron (ZOFRAN ODT) 4 MG disintegrating tablet Take 1 tablet (4 mg total) by mouth every 8 (eight) hours  as needed for nausea or vomiting. (Patient taking differently: 4 mg by PEG Tube route every 8 (eight) hours as needed for nausea or vomiting. ) 20 tablet 0 UNKNOWN at Unknown time  . pravastatin (PRAVACHOL) 40 MG tablet Take 1 tablet (40 mg total) by mouth daily. (Patient taking differently: 40 mg by PEG Tube route every evening. ) 30 tablet 12 10/28/2015 at 2100  . sodium chloride 1 G tablet 1 g by PEG Tube route 2 (two) times daily with a meal.    10/29/2015 at 1700  . warfarin (COUMADIN) 3 MG tablet 3 mg by PEG Tube route daily.   10/29/2015 at 1800  . Water For Irrigation, Sterile (FREE WATER) SOLN Place 200 mLs into feeding tube every 8 (eight) hours.   10/29/2015 at Unknown time  .  amoxicillin-clavulanate (AUGMENTIN) 200-28.5 MG/5ML suspension Place 21.9 mLs (875 mg total) into feeding tube every 12 (twelve) hours. For 2 more days (Patient not taking: Reported on 10/29/2015) 100 mL 0   . NIFEdipine (PROCARDIA XL/ADALAT-CC) 60 MG 24 hr tablet Take 1 tablet (60 mg total) by mouth daily. DO NOT CRUSH (Patient not taking: Reported on 10/29/2015) 30 tablet 5 10/16/2015 at Unknown time  . Nutritional Supplements (FEEDING SUPPLEMENT, VITAL AF 1.2 CAL,) LIQD Place 1,000 mLs into feeding tube daily. (Patient not taking: Reported on 10/29/2015)     . raloxifene (EVISTA) 60 MG tablet Take 1 tablet (60 mg total) by mouth daily. (Patient not taking: Reported on 10/17/2015) 90 tablet 1 10/07/2015 at Unknown time  . senna-docusate (SENOKOT-S) 8.6-50 MG per tablet Take 2 tablets by mouth 2 (two) times daily. For constipation (Patient not taking: Reported on 10/29/2015) 100 tablet 1 10/16/2015 at Unknown time  . warfarin (COUMADIN) 2 MG tablet Take 1/2 a pill on Friday evening and take a whole pill on all other days. (Patient not taking: Reported on 10/29/2015) 30 tablet 5 10/16/2015 at Unknown time   Assessment: 74 yo lady admitted with respiratory distress to continue coumadin for h/o CVA.  INR is therapeutic. No bleeding noted.  Coumadin was held yesterday due to sharp rise in INR, watery diarrhea reported.   Goal of Therapy:  INR 2-3 Monitor platelets by anticoagulation protocol: Yes   Plan:  Coumadin 2mg  po today x 1 (dose reduction to discourage overshoot) Daily PT/INR Monitor for bleeding complications  Thanks for allowing pharmacy to be a part of this patient's care.  Hart Robinsons, PharmD Clinical Pharmacist 11/02/2015,10:58 AM

## 2015-11-02 NOTE — Care Management Note (Signed)
Case Management Note  Patient Details  Name: Laura Melendez MRN: [id number] Date of Birth: [birth year]  Subjective/Objective:                  Pt returning to SNF today. CSW has arranged for return to facility. Pt, family and RN aware of DC plans.  Action/Plan: No CM needs.   Expected Discharge Date:                  Expected Discharge Plan:  Skilled Nursing Facility  In-House Referral:  Clinical Social Work  Discharge planning Services  CM Consult  Post Acute Care Choice:    Choice offered to:     DME Arranged:    DME Agency:     HH Arranged:    [place] Agency:     Status of Service:  Completed, signed off  Medicare Important Message Given:  Yes Date Medicare IM Given:    Medicare IM give by:    Date Additional Medicare IM Given:    Additional Medicare Important Message give by:     If discussed at [place] of Stay Meetings, dates discussed:    Additional Comments:  Sherald Barge, RN 11/02/2015, 3:41 PM

## 2015-11-02 NOTE — Progress Notes (Signed)
Nutrition Follow-Up  INTERVENTION:  Continue Vital 1.2 ml/hr @ goal rate 42 ml/hr. Provides 1200 kcal, 75 gr protein and 811 ml water. Meets 95% energy and 100% protein needs.  Free water -200 ml TID and 30 ml water before and after medications.     NUTRITION DIAGNOSIS:  Predicted suboptimal oral nutrient intake related to swallow difficulty ;ongoing. Pt PEG providing 100% of nutriton.  GOAL: Provide nutrition needs based on ASPEN/SCCM guidelines; met    MONITOR: TF tolerance, Labs, Weight trends, Skin   REASON FOR ASSESSMENT:  Consult Enteral/tube feeding initiation and management    ASSESSMENT: Laura Melendez is awake and talking this morning. No complaints related to her tube feeding. Her weight is down 2.6 kg (5.6%) since admission. Pt husband would like for ST to evaluate whether she can resume with po trials. Abnormal Labs: BUN improved from-35-to 26 today, glucose 138 mg/dl. Hemoglobin/Hematocrit- trending down- 7.7/25.4 today. PT, INR 24.7 and 2.26 respectively.  Diet Order:  Diet NPO time specified  Skin:   surgical wound to neck- healing  Last BM:  2 watery stools yesterday morning and 1 this morning   Height:   Ht Readings from Last 1 Encounters:  11/01/15 _0  (1.676 m)    Weight:   Wt Readings from Last 1 Encounters:  11/02/15 94 lb 9.2 oz (42.9 kg)    Ideal Body Weight:   59 kg  BMI:  Body mass index is 15.27 kg/(m^2).  Estimated Nutritional Needs:   Kcal:  1260   Protein:   70-75 gr  Fluid:   1.3 liters daily  EDUCATION NEEDS: none at this time      Colman Cater Laura,RD,CSG,LDN Office: #[phone] Pager: [phone]

## 2015-11-03 ENCOUNTER — Encounter (HOSPITAL_COMMUNITY)
Admission: AD | Admit: 2015-11-03 | Discharge: 2015-11-03 | Disposition: A | Discharge status: Home / Self Care | Payer: Medicare HMO | Source: Skilled Nursing Facility | Attending: Internal Medicine | Admitting: Internal Medicine

## 2015-11-03 ENCOUNTER — Other Ambulatory Visit: Payer: Self-pay | Admitting: *Deleted

## 2015-11-03 DIAGNOSIS — I63412 Cerebral infarction due to embolism of left middle cerebral artery: Secondary | ICD-10-CM

## 2015-11-03 LAB — CULTURE, BLOOD (ROUTINE X 2)
Culture: NO GROWTH
Culture: NO GROWTH

## 2015-11-03 LAB — ACETYLCHOLINE RECEPTOR AB, ALL
ACETYLCHOL BLOCK AB: 17 % (ref 0–25)
Acety choline binding ab: <0.03 nmol/L (ref 0.00–0.24)

## 2015-11-03 LAB — PROTIME-INR
INR: 1.94 — AB (ref 0.00–1.49)
Prothrombin Time: 22.1 seconds — ABNORMAL HIGH (ref 11.6–15.2)

## 2015-11-03 MED ORDER — METHYLPHENIDATE HCL 5 MG PO TABS: ORAL_TABLET | ORAL | Status: DC

## 2015-11-03 NOTE — Telephone Encounter (Signed)
[facility] 

## 2015-11-03 NOTE — Progress Notes (Signed)
Patient ID: Laura Melendez, female   DOB: 04-08-1941, 74 y.o.   MRN: [id number]                PROGRESS NOTE  DATE:  10/26/2015            FACILITY: [place]                  LEVEL OF CARE:   SNF   Acute Visit                      CHIEF COMPLAINT:  Review of multiple medical issues.      HISTORY OF PRESENT ILLNESS:  I continue to follow this patient, who is not doing well.  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    She had a complicated postop course, including a postoperative hematoma which required a trip back to the OR, gram-negative pneumonia requiring intubation, pneumothorax, and congestive heart failure.    She then developed problems with swallowing and really could not swallow properly.  Per description of the family, she was followed by Speech Therapy and then passed a single FEES study before being discharged here where she promptly failed her swallowing study and was admitted to hospital with aspiration pneumonia from 10/17/2015 through 10/21/2015.    With regards to that issue, she has not had any issues.  She is NPO except for trials with Speech Therapy.  She is being fed an elemental formula through her PEG tube, which she seems to be tolerating better with less nausea and less diarrhea.    She has, however, had a marked functional and cognitive deterioration.  Before she went to the hospital last time, I thought this woman had suffered a brainstem stroke.  She has absolutely no gag reflex, what appears to be some degree of facial weakness.  To my surprise, her MRI really did not show much change from her previous study earlier this year.  I have screened her for myasthenia gravis, and I have asked for a Neurology consult.     Also notable that her studies in April of this year showed a tremendous amount of thoracic aortic atherosclerosis.  I think she may be throwing emboli as she has a rash on both  feet and her hands.  She is supposed to be on Coumadin, although her INR today is down to 1.19.  I have not been aggressively anticoagulating her.  However, clearly we will need to do better than this.        REVIEW OF SYSTEMS:   Not possible from the patient although, in talking to the speech therapist, she seems really unable to do a proper swallow on multiple attempts.     PHYSICAL EXAMINATION:   CHEST/RESPIRATORY:  Fairly clear air entry bilaterally.    CARDIOVASCULAR:   CARDIAC:  Heart sounds are tachy.  She appears to be euvolemic.        GASTROINTESTINAL:   ABDOMEN:  Soft.  PEG site looks fine.   SKIN:   INSPECTION:   Extremities:  She has developed significant edema of the right greater than left hand with a rash which appears to be macular.  She also has areas on her feet that I noted before.      NEUROLOGICAL:   It is really difficult to examine her.   She has some movement of both hands and can wiggle her toes  on the right.  The rest of my findings have been previously documented.  I do not get the sense that she is cognitively alert as she was.    ASSESSMENT/PLAN:            Generalized weakness along with cranial nerve problems including complete absence of gag reflex and dysphagia.  I have wondered out of the box a bit whether this woman might have some form of inflammatory demyelinating polyradiculopathy, neuromuscular junction disease.  Her total CK was normal.  Beyond this, I really do not have a great sense of this.  Her MRI, to my surprise, was unchanged as I really believed she had probably had a brainstem stroke.    Chronic atrial fibrillation.  On Coumadin.  I have increased her Coumadin to 3 mg.  I will monitor this.    Fluid volume overload.  Her weight has gone up from 86.7 on 10/08/2015 to 98.5 on 10/24/2015.  Some of this could be simple nutrition, although I will start her on Lasix.    I did have an ethical discussion with the patient's husband two days ago.  He did  not seem interested in chronic PEG tube feeding.  The whole issue would be a little simpler if I could really understand how you go from a left parotid endarterectomy, which was elective, to this.  I really do not have a good sense of this.  I have asked for a Neurology review to see if another set of eyes could provide some insight into her weakness pattern, mental status abnormalities, etc.     CPT CODE: 16109

## 2015-11-04 ENCOUNTER — Encounter (HOSPITAL_COMMUNITY)
Admission: AD | Admit: 2015-11-04 | Discharge: 2015-11-04 | Disposition: A | Discharge status: Home / Self Care | Payer: Medicare HMO | Source: Skilled Nursing Facility | Attending: Internal Medicine | Admitting: Internal Medicine

## 2015-11-04 ENCOUNTER — Other Ambulatory Visit: Payer: Medicare HMO

## 2015-11-04 ENCOUNTER — Non-Acute Institutional Stay (SKILLED_NURSING_FACILITY): Payer: Medicare HMO | Admitting: Internal Medicine

## 2015-11-04 DIAGNOSIS — I63412 Cerebral infarction due to embolism of left middle cerebral artery: Secondary | ICD-10-CM

## 2015-11-04 DIAGNOSIS — R569 Unspecified convulsions: Secondary | ICD-10-CM | POA: Diagnosis not present

## 2015-11-04 DIAGNOSIS — I5033 Acute on chronic diastolic (congestive) heart failure: Secondary | ICD-10-CM | POA: Diagnosis not present

## 2015-11-04 DIAGNOSIS — J9601 Acute respiratory failure with hypoxia: Secondary | ICD-10-CM

## 2015-11-04 LAB — COMPREHENSIVE METABOLIC PANEL
ALBUMIN: 2 g/dL — AB (ref 3.5–5.0)
ALK PHOS: 42 U/L (ref 38–126)
ALT: 17 U/L (ref 14–54)
AST: 17 U/L (ref 15–41)
Anion gap: 6 (ref 5–15)
BILIRUBIN TOTAL: 0.3 mg/dL (ref 0.3–1.2)
BUN: 31 mg/dL — AB (ref 6–20)
CALCIUM: 8.3 mg/dL — AB (ref 8.9–10.3)
CO2: 29 mmol/L (ref 22–32)
Chloride: 104 mmol/L (ref 101–111)
Creatinine, Ser: 0.51 mg/dL (ref 0.44–1.00)
GFR calc Af Amer: >60 mL/min (ref >60)
GFR calc non Af Amer: >60 mL/min (ref >60)
GLUCOSE: 150 mg/dL — AB (ref 65–99)
Potassium: 4.2 mmol/L (ref 3.5–5.1)
Sodium: 139 mmol/L (ref 135–145)
TOTAL PROTEIN: 5.1 g/dL — AB (ref 6.5–8.1)

## 2015-11-04 LAB — CBC
HEMATOCRIT: 27 % — AB (ref 36.0–46.0)
HEMOGLOBIN: 8 g/dL — AB (ref 12.0–15.0)
MCH: 25.3 pg — ABNORMAL LOW (ref 26.0–34.0)
MCHC: 29.6 g/dL — ABNORMAL LOW (ref 30.0–36.0)
MCV: 85.4 fL (ref 78.0–100.0)
Platelets: 593 10*3/uL — ABNORMAL HIGH (ref 150–400)
RBC: 3.16 MIL/uL — AB (ref 3.87–5.11)
RDW: 15.9 % — ABNORMAL HIGH (ref 11.5–15.5)
WBC: 8.6 10*3/uL (ref 4.0–10.5)

## 2015-11-04 LAB — PROTIME-INR
INR: 1.83 — ABNORMAL HIGH (ref 0.00–1.49)
Prothrombin Time: 21.1 seconds — ABNORMAL HIGH (ref 11.6–15.2)

## 2015-11-04 NOTE — Progress Notes (Signed)
Patient ID: Laura Melendez, female   DOB: 1941/07/11, 74 y.o.   MRN: [id number]                 PROGRESS NOTE  DATE:  11/04/2015          FACILITY: [place]                    LEVEL OF CARE:   SNF   Acute Visit                 CHIEF COMPLAINT: Acute visit status post hospitalization for acute respiratory failure with hypoxia pulse secondary to CHF.      HISTORY OF PRESENT ILLNESS:   Patient is a pleasant very complex 74 year old female who was recently hospitalized for acute respiratory failure secondary to acute on chronic diastolic CHF    In brief review of her history, she is a patient who went into [facility] for an elective left parotidectomy on 09/19/2015.  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  On postoperative day #1 on 09/20/2015, she developed a hematoma and she had to go back for surgical incision and evacuation of this on 09/20/2015.  Postoperatively after this, she went into respiratory failure and required intubation.   It was felt that she had reactive airway swelling in the setting of the hematoma and postoperative complications.   She developed a right pneumothorax, pneumonia secondary to E.coli, Klebsiella for which she was treated.  On 09/22/2015, she went into congestive heart failure and required IV Lasix.  After this, she developed swallowing problems and failed numerous swallowing evaluations, ultimately ending up with a PEG tube on 10/03/2015.  Before she left [facility], she was cleared for a pureed diet based on a FEES study.  She was restarted on her home Coumadin.    Since her arrival here, things have not gone particularly well.  She was very weak here and noncommunicative during her first stay here.  Marland Kitchen  An MRI ordered by Dr. Dellia Nims previously was negative for a CVA especially a brainstem cva which Dr. Dellia Nims had suspected.   Complicating all of this, I have always felt that this woman had severe depression and minimal effort.      She was hospitalized on  10/17/2015 for acute respiratory failure secondary to aspiration pneumonia.     On her return here she continued to be very weak on date of most recent hospitalization she was found to be in respiratory distress-BNP was greater than 4500 troponin was elevated at 0.1.  White count was up to 22,000.  She was put on BiPAP and given IV Lasix and improved.  L1 point she had been on Bumex the hospital this was DC'd and she is back on her home dose of Lasix 20 mg a day this appears actually to be stable currently.  Her elevated troponin was thought secondary to acute CHF with no further cardiac workup indicated.  Regards to dysphagia she is status post peg tube she has been working with speech therapy to advance her diet this will need to be followed up on.  She also is completing a course for pansensitive Escherichia coli she is on Cipro for 3 more da Her history of seizures she is on Keppra no recent seizures to my knowledge or in the hospital.  Currently she actually appears significantly improved she is more bright alert actually sitting up in the chair today she is able to converse this  is a significant improvement from previous times I have seen her.  .     Past Medical History  Diagnosis Date  . Essential hypertension, benign   . Type 2 diabetes mellitus ([place])   . Osteoporosis 06/2008  . 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 History of parotid cancer                  Past Surgical History  Procedure Laterality Date  . Ankle surgery Left 2004    otif  . Abdominal hysterectomy    . Anterior and posterior repair N/A 03/11/2013    Procedure: ANTERIOR (CYSTOCELE) AND POSTERIOR REPAIR (RECTOCELE);  Surgeon: Linda Hedges, DO;  Location: [place] [facility];  Service: Gynecology;  Laterality: N/A;  with sacrospinous ligament fixation bilateral  . Colonoscopy  2009    negative  . Cataract extraction w/phaco Right 12/27/2014    Procedure: CATARACT EXTRACTION PHACO AND  INTRAOCULAR LENS PLACEMENT RIGHT EYE;  Surgeon: Tonny Branch, MD;  Location: [facility];  Service: Ophthalmology;  Laterality: Right;  CDE:10.63  . Cataract extraction w/phaco Left 01/13/2015    Procedure: CATARACT EXTRACTION PHACO AND INTRAOCULAR LENS PLACEMENT LEFT EYE;  Surgeon: Tonny Branch, MD;  Location: [facility];  Service: Ophthalmology;  Laterality: Left;  CDE:18.87  . Cholecystectomy    . Tee without cardioversion N/A 04/01/2015    Procedure: TRANSESOPHAGEAL ECHOCARDIOGRAM (TEE);  Surgeon: Sanda Klein, MD;  Location: [facility] ENDOSCOPY;  Service: Cardiovascular;  Laterality: N/A;  . Peg placement    . Parotidectomy     CURRENT MEDICATIONS:  Medication list is reviewed. And includes.  Aspirin 81 mg daily.  Ciprofloxacin 250 mg via tube twice a day for 3 additional days status post discharge from hospital.  Lotrisone cream when necessary.  Prozac 10 mg daily.  Lasix 20 mg daily.  Keppra 750 mg every morning 1500 mg daily at bedtime.  Imodium when necessary.  Glucophage 500 mg take one half tab twice a day.  Ritalin 5 mg twice a day.  Reglan 10 mg via feeding tube every 6 hours when necessary.  Lopressor 25 mg twice a day.  Nystatin powder to rash on per name and buttocks every shift.  Zofran 4 mg every 8 hours when necessary.  Pravastatin 40 mg daily.  Coumadin 3 mg via PEG daily.      REVIEW OF SYSTEMS:     In general says she feels significantly better no fever chills noted.  Skin does have a history of Neil rash this being treated by nursing apparently stable.  Head ears eyes nose mouth and throat does not complain of a sore throat or nasal discharge.  Respiratory is not complaining of cough or shortness of breath although she has a significant history here.  Cardiac does not complain of chest pain.  GI is status post peg tube is not complaining of abdominal discomfort nausea or vomiting today.  GU does not complain of dysuria is being treated for UTI.  Muscle  skeletal continues with significant weakness but she is now actually able to sit up in a chair which is encouraging she is not complaining of joint pain.  Neurologic history of weakness more so on her right side but this appears to be improving somewhat.   psych does have a history of depression but this appears to be improved she is on Ritalin possibly this may be helping.  Physical exam.  Temperature is 98.6 pulse 83 respirations 18 blood pressure 132/61.  GENERAL APPEARANCE:  The patient is awake alert is able to speak some sitting up in the chair Her skin is warm and dry is a perineal--buttock  rash which apparently stable and followed by nursing Oropharynx appears clear mucous membranes moist.  Eyes does have somewhat of an upward gaze pupils do appear reactive to light.  .     CHEST/RESPIRATORY:  Shallow, but clear air entry.       CARDIOVASCULAR:   CARDIAC:  Heart sounds are normal.  She appears to be euvolemic.  There is a mid systolic murmur that sounds benign. --No significant lower extremity edema  GASTROINTESTINAL:   ABDOMEN:  Status post peg tube which appears to be within normal limits abdomen is slightly protuberant does not appear to be tender Bowel sounds are positive.   GENITOURINARY:   BLADDER:  Foley catheter in place Muscle skeletal is able to move all extremities but is quite weak bit more on the right versus the left in regards to weakness-.  Neurologic as noted above visual acuity appears grossly intact she does have a slight upward gaze--continues with significant right-sided weakness although this appears to be somewhat improved   Psych she is pleasant is able to talk some interactive this is a definite improvement from previous times have seen her  Labs.  11/04/2015.  Sodium 139 potassium 4.2 BUN 31 creatinine 0.51-albumin 2.0.  WBC 8.6 hemoglobin 8.0 platelets 593.  INR  1.83.       ASSESSMENT/PLAN:  #1 history of acute respiratory  failure secondary to acute on chronic diastolic CHF-she appears to have stabilized to actually looks the best I have seen her-she is on Lasix 20 mg a day at this point will monitor.  In regards respiratory failure she continues at risk for aspiration this again is quite a challenging situation but at this point she actually appears to be stable she does continue on Reglan every 6 hours as well as Zofran as needed -again she does continue to have the PEG tube-this will need evaluation by speech therapy.  #3 UTI she appears to be relatively asymptomatic she is finishing a course of Cipro for Escherichia coli discovered in the hospital.  #4 history depression this appears significantly improved she is more bright interactive actually sitting in wheelchair today she is on Prozac I note she also is on Ritalin.  #5 history of seizure disorder no recent seizures to my knowledge she continues on Keppra.  #6 history of diabetes type 2 she continues on Glucophage continue to monitor CBGs --this appears to have been stable in the hospital with blood sugars appearing to run largely in the 100s.  #7 history of CVA she continues on Coumadin for anticoagulation INR is slightly subtherapeutic will give her an additional 0.5 mg of Coumadin to make 3.5 mg Coumadin tonight and recheck this tomorrow.  Number a history weakness-this actually appears to be improving which is encouraging she still continues to be at very frail individual however at risk for respiratory distress especially with history of aspiration this will have to be monitored.  [id number] note greater than 40 minutes spent assessing patient reviewing her chart-and coordinating and formulating a plan of care for numerous diagnoses-of note greater than 50% of time spent coordinating plan of care

## 2015-11-05 ENCOUNTER — Non-Acute Institutional Stay (SKILLED_NURSING_FACILITY): Payer: Medicare HMO | Admitting: Internal Medicine

## 2015-11-05 ENCOUNTER — Other Ambulatory Visit (HOSPITAL_COMMUNITY)
Admission: RE | Admit: 2015-11-05 | Discharge: 2015-11-05 | Disposition: A | Discharge status: Home / Self Care | Payer: Medicare HMO | Source: Skilled Nursing Facility | Attending: Internal Medicine | Admitting: Internal Medicine

## 2015-11-05 DIAGNOSIS — J9601 Acute respiratory failure with hypoxia: Secondary | ICD-10-CM

## 2015-11-05 DIAGNOSIS — R1314 Dysphagia, pharyngoesophageal phase: Secondary | ICD-10-CM

## 2015-11-05 DIAGNOSIS — F322 Major depressive disorder, single episode, severe without psychotic features: Secondary | ICD-10-CM

## 2015-11-05 DIAGNOSIS — I5020 Unspecified systolic (congestive) heart failure: Secondary | ICD-10-CM

## 2015-11-05 DIAGNOSIS — Z7901 Long term (current) use of anticoagulants: Secondary | ICD-10-CM | POA: Diagnosis present

## 2015-11-05 LAB — PROTIME-INR
INR: 1.49 (ref 0.00–1.49)
Prothrombin Time: 18.1 seconds — ABNORMAL HIGH (ref 11.6–15.2)

## 2015-11-05 NOTE — Progress Notes (Signed)
Patient ID: Laura Melendez, female   DOB: 01-Apr-1941, 74 y.o.   MRN: [id number]  Facility; [facility] Chief complaint; readmission to SNF post admit to [facility] from 11/12 to 11/02/15   History; this is a patient who initially came to [place] from [facility] where she was hospitalized for an elective left parotidectomy for a tumor. She subsequently had a wound hematoma [place] another trip to the ER, gram-negative pneumonia with respiratory failure requiring intubation. Before she left last this hospital she managed to pass a fees study however they had already placed a PEG tube. On her arrival here she grossly failed a swallowing study and she has been on nothing by mouth status since. She was rehospitalized the [facility] from 10/31 through 11/4 with aspiration pneumonia. She had been progressively declining here with bilateral weakness. Facial weakness. I repeated her MRI of the head in late October October which did not show a brainstem stroke that I had been suspecting. In fact the cause of her progressive decline was not at all clear to me. I had her seen by neurology, I don't know that I have seen and actual report . 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 Her repeat MRI that I did was not changed from previous study done at that time  She was hospitalized on this occasion with increasing respiratory distress. She was felt to be in congestive heart failure. Chest x-ray showed by basilar infiltrates. She was diureses and was 1.68 L negative since admission and she was placed back on a home dose of Lasix at 20 mg. She had elevated troponins felt to be secondary to CHF. On arrival her white count was 22,000 hemoglobin of 18 creatinine of 0.48 she was placed on BiPAP and given IV Lasix and improved.  BMP Latest Ref Rng 11/04/2015 11/02/2015 11/01/2015  Glucose 65 - 99 mg/dL 150(H) 138(H) 143(H)  BUN 6 - 20 mg/dL 31(H)  26(H) 35(H)  Creatinine 0.44 - 1.00 mg/dL 0.51 0.35(L) 0.49  BUN/Creat Ratio 11 - 26 - - -  Sodium 135 - 145 mmol/L 139 142 140  Potassium 3.5 - 5.1 mmol/L 4.2 3.6 3.8  Chloride 101 - 111 mmol/L 104 107 107  CO2 22 - 32 mmol/L 29 28 28   Calcium 8.9 - 10.3 mg/dL 8.3(L) 8.4(L) 7.9(L)    CBC Latest Ref Rng 11/04/2015 11/02/2015 11/01/2015  WBC 4.0 - 10.5 K/uL 8.6 8.4 7.7  Hemoglobin 12.0 - 15.0 g/dL 8.0(L) 7.7(L) 7.6(L)  Hematocrit 36.0 - 46.0 % 27.0(L) 25.4(L) 24.5(L)  Platelets 150 - 400 K/uL 593(H) 583(H) 523(H)      Past Medical History  Diagnosis Date  . Essential hypertension, benign   . Type 2 diabetes mellitus ([place])   . Osteoporosis 06/2008  . 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 History of parotid cancer     Past Surgical History  Procedure Laterality Date  . Ankle surgery Left 2004    otif  . Abdominal hysterectomy    . Anterior and posterior repair N/A 03/11/2013    Procedure: ANTERIOR (CYSTOCELE) AND POSTERIOR REPAIR (RECTOCELE);  Surgeon: Linda Hedges, DO;  Location: [facility];  Service: Gynecology;  Laterality: N/A;  with sacrospinous ligament fixation bilateral  . Colonoscopy  2009    negative  . Cataract extraction w/phaco Right 12/27/2014    Procedure: CATARACT EXTRACTION PHACO AND INTRAOCULAR LENS PLACEMENT RIGHT EYE;  Surgeon:  Tonny Branch, MD;  Location: [facility];  Service: Ophthalmology;  Laterality: Right;  CDE:10.63  . Cataract extraction w/phaco Left 01/13/2015    Procedure: CATARACT EXTRACTION PHACO AND INTRAOCULAR LENS PLACEMENT LEFT EYE;  Surgeon: Tonny Branch, MD;  Location: [facility];  Service: Ophthalmology;  Laterality: Left;  CDE:18.87  . Cholecystectomy    . Tee without cardioversion N/A 04/01/2015    Procedure: TRANSESOPHAGEAL ECHOCARDIOGRAM (TEE);  Surgeon: Sanda Klein, MD;  Location: [facility];  Service: Cardiovascular;  Laterality: N/A;  . Peg placement    . Parotidectomy      Current Outpatient Prescriptions on File Prior to Visit   Medication Sig Dispense Refill  . aspirin EC 81 MG EC tablet Take 1 tablet (81 mg total) by mouth daily.    . ciprofloxacin (CIPRO) 250 MG tablet Place 1 tablet (250 mg total) into feeding tube 2 (two) times daily. 6 tablet 0  . clotrimazole-betamethasone (LOTRISONE) cream Apply 1 application topically daily as needed (APPLY A THIN LAYER AS BARRIER AFTER CLEANING AND DRYING BUTTOCKS AREA COMPLETELY).    Marland Kitchen FLUoxetine (PROZAC) 10 MG capsule TAKE 1 CAPSULE BY MOUTH DAILY FOR DEPRESSION/ANXIETY (Patient taking differently: TAKE 1 CAPSULE VIA PEG DAILY FOR DEPRESSION/ANXIETY) 90 capsule 1  . furosemide (LASIX) 20 MG tablet 20 mg by PEG Tube route daily.    Marland Kitchen levETIRAcetam (KEPPRA) 750 MG tablet TAKE 1 TABLET BY MOUTH EVERY MORNING AND 2 TABLETS BY MOUTH EVERY NIGHT AT BEDTIME (Patient taking differently: TAKE 1 TABLET VIA PEG EVERY MORNING AND 2 TABLETS BY MOUTH EVERY NIGHT AT BEDTIME) 270 tablet 3  . loperamide (IMODIUM) 2 MG capsule Take 1 capsule (2 mg total) by mouth every 4 (four) hours as needed for diarrhea or loose stools. 30 capsule 0  . metFORMIN (GLUCOPHAGE) 500 MG tablet Take one half tablet BID (Patient taking differently: 250 mg by PEG Tube route 2 (two) times daily with a meal. ) 30 tablet 5  . methylphenidate (RITALIN) 5 MG tablet Give one tablet per tube twice daily with breakfast and lunch 60 tablet 0  . metoCLOPramide (REGLAN) 10 MG tablet Place 1 tablet (10 mg total) into feeding tube every 6 (six) hours as needed for nausea.    . metoprolol tartrate (LOPRESSOR) 25 MG tablet Take 25 mg by mouth 2 (two) times daily.    . Nutritional Supplements (FEEDING SUPPLEMENT, VITAL AF 1.2 CAL,) LIQD Place 1,000 mLs into feeding tube daily. (Patient not taking: Reported on 10/29/2015)    . nystatin (MYCOSTATIN/NYSTOP) 100000 UNIT/GM POWD Apply topically 3 (three) times daily. **APPLY A DUSTING TO RASH ON PERINEUM AND BUTTOCKS AT EACH SHIFT    . ondansetron (ZOFRAN ODT) 4 MG disintegrating tablet  Take 1 tablet (4 mg total) by mouth every 8 (eight) hours as needed for nausea or vomiting. (Patient taking differently: 4 mg by PEG Tube route every 8 (eight) hours as needed for nausea or vomiting. ) 20 tablet 0  . pravastatin (PRAVACHOL) 40 MG tablet Take 1 tablet (40 mg total) by mouth daily. (Patient taking differently: 40 mg by PEG Tube route every evening. ) 30 tablet 12  . warfarin (COUMADIN) 3 MG tablet 3 mg by PEG Tube route daily.    . Water For Irrigation, Sterile (FREE WATER) SOLN Place 200 mLs into feeding tube every 8 (eight) hours.      Social; prior to this the patient was living with her husband in [place]. She had a stroke in April but made a good functional recovery.  This is the history that I've been provided with.  family history includes Cancer in her father and sister; Dementia in her mother; Diabetes in her brother.  Review of systems Gen. family states the patient is remarkably improved HEENT no headache Respiratory no shortness of breath Cardiac no chest pain GI no abdominal pain GU Foley catheter in place Neurologic She is not complaining of weakness or numbness.  Physical examination Gen. again the patient looks remarkably better Respiratory; shallow but otherwise clear entry bilaterally Cardiac heart sounds are normal no murmurs JVP is not elevated Abdomen excised looks fine no liver no spleen no tenderness GU no suprapubic or costovertebral angle tenderness Foley catheter in place Skin; I did not look at her coccyx or buttock sig. The rash that I thought was probably embolic in her feet seems to be fading. 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 Tongue is normal cranial nerves 5 79 and 10 all appear normal. In the upper extremity she is able to move her right arm but with less strength in the right she does have antigravity  strength in hip flexors and abductors Mental status much brighter more alert focus and conversational.  Impression/plan #1 considerable improvement in her overall status. I suspect this is due to the Ritalin and perhaps the antidepressant. I had felt that she had major depression perhaps the medications have had now adequate time to work We'll have to see how this affects her swallowing #2 type 2 diabetes on oral agents #3 dysphagia; the cause of this is never really been totally clear although I wonder if it will improve now she has had recurrent aspiration problems. #4 congestive heart failure she was diureses and her respiratory status improved she temporarily required BiPAP. Her BNP was greater than 4500 #5 she is on Keppra I'm not exactly sure of the history here. #6 hyperlipidemia on Pravachol. I believe I had checked total CK. 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 She is weak on the right side but I don't believe that is new. #8 osteoporosis; I believe I had stopped her Evista and Fosamax early during her stay here in case this at some hour infected her swallowing among other issues. #9 chronic atrial fibrillation on Coumadin last INR was 1.49

## 2015-11-06 ENCOUNTER — Encounter (HOSPITAL_COMMUNITY): Payer: Self-pay | Admitting: *Deleted

## 2015-11-06 ENCOUNTER — Encounter: Payer: Self-pay | Admitting: Internal Medicine

## 2015-11-06 ENCOUNTER — Inpatient Hospital Stay (HOSPITAL_COMMUNITY)
Admission: EM | Admit: 2015-11-06 | Discharge: 2015-11-15 | DRG: 177 | Disposition: A | Discharge status: Skilled Nursing Facility | Payer: Medicare HMO | Attending: Internal Medicine | Admitting: Internal Medicine

## 2015-11-06 ENCOUNTER — Emergency Department (HOSPITAL_COMMUNITY): Payer: Medicare HMO

## 2015-11-06 DIAGNOSIS — Z7901 Long term (current) use of anticoagulants: Secondary | ICD-10-CM | POA: Diagnosis not present

## 2015-11-06 DIAGNOSIS — J189 Pneumonia, unspecified organism: Secondary | ICD-10-CM

## 2015-11-06 DIAGNOSIS — M810 Age-related osteoporosis without current pathological fracture: Secondary | ICD-10-CM | POA: Diagnosis present

## 2015-11-06 DIAGNOSIS — E119 Type 2 diabetes mellitus without complications: Secondary | ICD-10-CM | POA: Diagnosis present

## 2015-11-06 DIAGNOSIS — I110 Hypertensive heart disease with heart failure: Secondary | ICD-10-CM | POA: Diagnosis present

## 2015-11-06 DIAGNOSIS — T502X5A Adverse effect of carbonic-anhydrase inhibitors, benzothiadiazides and other diuretics, initial encounter: Secondary | ICD-10-CM | POA: Diagnosis present

## 2015-11-06 DIAGNOSIS — I69391 Dysphagia following cerebral infarction: Secondary | ICD-10-CM | POA: Diagnosis not present

## 2015-11-06 DIAGNOSIS — Z515 Encounter for palliative care: Secondary | ICD-10-CM | POA: Diagnosis not present

## 2015-11-06 DIAGNOSIS — Z7982 Long term (current) use of aspirin: Secondary | ICD-10-CM

## 2015-11-06 DIAGNOSIS — Z87891 Personal history of nicotine dependence: Secondary | ICD-10-CM | POA: Diagnosis not present

## 2015-11-06 DIAGNOSIS — Z7189 Other specified counseling: Secondary | ICD-10-CM | POA: Insufficient documentation

## 2015-11-06 DIAGNOSIS — Z7984 Long term (current) use of oral hypoglycemic drugs: Secondary | ICD-10-CM

## 2015-11-06 DIAGNOSIS — D473 Essential (hemorrhagic) thrombocythemia: Secondary | ICD-10-CM | POA: Diagnosis present

## 2015-11-06 DIAGNOSIS — Z931 Gastrostomy status: Secondary | ICD-10-CM

## 2015-11-06 DIAGNOSIS — E785 Hyperlipidemia, unspecified: Secondary | ICD-10-CM | POA: Diagnosis present

## 2015-11-06 DIAGNOSIS — Z681 Body mass index (BMI) 19 or less, adult: Secondary | ICD-10-CM

## 2015-11-06 DIAGNOSIS — Z809 Family history of malignant neoplasm, unspecified: Secondary | ICD-10-CM

## 2015-11-06 DIAGNOSIS — E0811 Diabetes mellitus due to underlying condition with ketoacidosis with coma: Secondary | ICD-10-CM | POA: Diagnosis not present

## 2015-11-06 DIAGNOSIS — Z833 Family history of diabetes mellitus: Secondary | ICD-10-CM

## 2015-11-06 DIAGNOSIS — J9621 Acute and chronic respiratory failure with hypoxia: Secondary | ICD-10-CM | POA: Diagnosis present

## 2015-11-06 DIAGNOSIS — Z794 Long term (current) use of insulin: Secondary | ICD-10-CM | POA: Diagnosis not present

## 2015-11-06 DIAGNOSIS — R0600 Dyspnea, unspecified: Secondary | ICD-10-CM | POA: Diagnosis present

## 2015-11-06 DIAGNOSIS — E440 Moderate protein-calorie malnutrition: Secondary | ICD-10-CM | POA: Diagnosis present

## 2015-11-06 DIAGNOSIS — E876 Hypokalemia: Secondary | ICD-10-CM | POA: Diagnosis not present

## 2015-11-06 DIAGNOSIS — I69921 Dysphasia following unspecified cerebrovascular disease: Secondary | ICD-10-CM | POA: Diagnosis not present

## 2015-11-06 DIAGNOSIS — I5033 Acute on chronic diastolic (congestive) heart failure: Secondary | ICD-10-CM | POA: Diagnosis present

## 2015-11-06 DIAGNOSIS — I69351 Hemiplegia and hemiparesis following cerebral infarction affecting right dominant side: Secondary | ICD-10-CM

## 2015-11-06 DIAGNOSIS — J9601 Acute respiratory failure with hypoxia: Secondary | ICD-10-CM | POA: Diagnosis not present

## 2015-11-06 DIAGNOSIS — D649 Anemia, unspecified: Secondary | ICD-10-CM | POA: Diagnosis present

## 2015-11-06 DIAGNOSIS — J690 Pneumonitis due to inhalation of food and vomit: Secondary | ICD-10-CM | POA: Diagnosis present

## 2015-11-06 DIAGNOSIS — R400 Somnolence: Secondary | ICD-10-CM | POA: Diagnosis not present

## 2015-11-06 DIAGNOSIS — I482 Chronic atrial fibrillation: Secondary | ICD-10-CM | POA: Diagnosis present

## 2015-11-06 DIAGNOSIS — R627 Adult failure to thrive: Secondary | ICD-10-CM | POA: Diagnosis present

## 2015-11-06 DIAGNOSIS — J9690 Respiratory failure, unspecified, unspecified whether with hypoxia or hypercapnia: Secondary | ICD-10-CM | POA: Diagnosis present

## 2015-11-06 DIAGNOSIS — R4182 Altered mental status, unspecified: Secondary | ICD-10-CM

## 2015-11-06 DIAGNOSIS — R9431 Abnormal electrocardiogram [ECG] [EKG]: Secondary | ICD-10-CM | POA: Diagnosis not present

## 2015-11-06 DIAGNOSIS — I10 Essential (primary) hypertension: Secondary | ICD-10-CM

## 2015-11-06 DIAGNOSIS — T17910A Gastric contents in respiratory tract, part unspecified causing asphyxiation, initial encounter: Secondary | ICD-10-CM

## 2015-11-06 DIAGNOSIS — Z85818 Personal history of malignant neoplasm of other sites of lip, oral cavity, and pharynx: Secondary | ICD-10-CM | POA: Diagnosis not present

## 2015-11-06 DIAGNOSIS — R0902 Hypoxemia: Secondary | ICD-10-CM

## 2015-11-06 DIAGNOSIS — I639 Cerebral infarction, unspecified: Secondary | ICD-10-CM | POA: Diagnosis present

## 2015-11-06 LAB — BLOOD GAS, ARTERIAL
ACID-BASE EXCESS: 0.5 mmol/L (ref 0.0–2.0)
BICARBONATE: 23.8 meq/L (ref 20.0–24.0)
DRAWN BY: 21310
Delivery systems: POSITIVE
Expiratory PAP: 6
FIO2: 50
Inspiratory PAP: 12
O2 Saturation: 97.7 %
PCO2 ART: 42 mmHg (ref 35.0–45.0)
PH ART: 7.375 (ref 7.350–7.450)
PO2 ART: 118 mmHg — AB (ref 80.0–100.0)
TCO2: 17.2 mmol/L (ref 0–100)

## 2015-11-06 LAB — CBC WITH DIFFERENTIAL/PLATELET
Basophils Absolute: 0.1 10*3/uL (ref 0.0–0.1)
Basophils Relative: 1 %
Eosinophils Absolute: 0.2 10*3/uL (ref 0.0–0.7)
Eosinophils Relative: 2 %
HCT: 27.9 % — ABNORMAL LOW (ref 36.0–46.0)
HEMOGLOBIN: 8.5 g/dL — AB (ref 12.0–15.0)
LYMPHS ABS: 3.5 10*3/uL (ref 0.7–4.0)
LYMPHS PCT: 30 %
MCH: 25.4 pg — AB (ref 26.0–34.0)
MCHC: 30.5 g/dL (ref 30.0–36.0)
MCV: 83.3 fL (ref 78.0–100.0)
MONOS PCT: 6 %
Monocytes Absolute: 0.7 10*3/uL (ref 0.1–1.0)
NEUTROS PCT: 61 %
Neutro Abs: 7.1 10*3/uL (ref 1.7–7.7)
PLATELETS: 689 10*3/uL — AB (ref 150–400)
RBC: 3.35 MIL/uL — AB (ref 3.87–5.11)
RDW: 16.5 % — AB (ref 11.5–15.5)
WBC: 11.5 10*3/uL — AB (ref 4.0–10.5)

## 2015-11-06 LAB — URINALYSIS, ROUTINE W REFLEX MICROSCOPIC
Bilirubin Urine: NEGATIVE
GLUCOSE, UA: NEGATIVE mg/dL
KETONES UR: NEGATIVE mg/dL
Nitrite: NEGATIVE
PH: 6 (ref 5.0–8.0)
Protein, ur: >300 mg/dL — AB
Specific Gravity, Urine: 1.025 (ref 1.005–1.030)

## 2015-11-06 LAB — URINE MICROSCOPIC-ADD ON

## 2015-11-06 LAB — GLUCOSE, CAPILLARY
GLUCOSE-CAPILLARY: 89 mg/dL (ref 65–99)
Glucose-Capillary: 119 mg/dL — ABNORMAL HIGH (ref 65–99)

## 2015-11-06 LAB — BASIC METABOLIC PANEL
Anion gap: 10 (ref 5–15)
BUN: 28 mg/dL — AB (ref 6–20)
CALCIUM: 8.8 mg/dL — AB (ref 8.9–10.3)
CO2: 26 mmol/L (ref 22–32)
CREATININE: 0.54 mg/dL (ref 0.44–1.00)
Chloride: 105 mmol/L (ref 101–111)
GFR calc Af Amer: >60 mL/min (ref >60)
Glucose, Bld: 262 mg/dL — ABNORMAL HIGH (ref 65–99)
POTASSIUM: 4.4 mmol/L (ref 3.5–5.1)
SODIUM: 141 mmol/L (ref 135–145)

## 2015-11-06 LAB — MAGNESIUM: MAGNESIUM: 2.2 mg/dL (ref 1.7–2.4)

## 2015-11-06 LAB — PROTIME-INR
INR: 1.95 — ABNORMAL HIGH (ref 0.00–1.49)
Prothrombin Time: 22.1 seconds — ABNORMAL HIGH (ref 11.6–15.2)

## 2015-11-06 LAB — LACTIC ACID, PLASMA
Lactic Acid, Venous: 3.1 mmol/L (ref 0.5–2.0)
Lactic Acid, Venous: 1.5 mmol/L (ref 0.5–2.0)

## 2015-11-06 LAB — BRAIN NATRIURETIC PEPTIDE: B NATRIURETIC PEPTIDE 5: 3854 pg/mL — AB (ref 0.0–100.0)

## 2015-11-06 LAB — TROPONIN I: Troponin I: 0.03 ng/mL (ref <0.031)

## 2015-11-06 MED ORDER — ACETAMINOPHEN 325 MG PO TABS: 650.0000 mg | ORAL_TABLET | Freq: Four times a day (QID) | ORAL | Status: DC | PRN

## 2015-11-06 MED ORDER — INSULIN ASPART 100 UNIT/ML ~~LOC~~ SOLN
0.0000 [IU] | SUBCUTANEOUS | Status: DC
  Administered 2015-11-07: 3 [IU] via SUBCUTANEOUS
  Administered 2015-11-07 (×4): 1 [IU] via SUBCUTANEOUS
  Administered 2015-11-08: 2 [IU] via SUBCUTANEOUS
  Administered 2015-11-08: 1 [IU] via SUBCUTANEOUS
  Administered 2015-11-08: 2 [IU] via SUBCUTANEOUS
  Administered 2015-11-08: 1 [IU] via SUBCUTANEOUS
  Administered 2015-11-09: 2 [IU] via SUBCUTANEOUS
  Administered 2015-11-09: 1 [IU] via SUBCUTANEOUS
  Administered 2015-11-09 – 2015-11-10 (×2): 2 [IU] via SUBCUTANEOUS
  Administered 2015-11-10: 1 [IU] via SUBCUTANEOUS
  Administered 2015-11-10 – 2015-11-11 (×2): 2 [IU] via SUBCUTANEOUS
  Administered 2015-11-11: 1 [IU] via SUBCUTANEOUS
  Administered 2015-11-12: 2 [IU] via SUBCUTANEOUS
  Administered 2015-11-12 (×3): 1 [IU] via SUBCUTANEOUS
  Administered 2015-11-13: 2 [IU] via SUBCUTANEOUS
  Administered 2015-11-13 (×2): 1 [IU] via SUBCUTANEOUS
  Administered 2015-11-13: 2 [IU] via SUBCUTANEOUS
  Administered 2015-11-14 (×2): 1 [IU] via SUBCUTANEOUS
  Administered 2015-11-14: 2 [IU] via SUBCUTANEOUS
  Administered 2015-11-15 (×3): 1 [IU] via SUBCUTANEOUS

## 2015-11-06 MED ORDER — VITAL AF 1.2 CAL PO LIQD
1000.0000 mL | ORAL | Status: DC
  Administered 2015-11-06 – 2015-11-07 (×2): 1000 mL
  Filled 2015-11-06 (×4): qty 1000

## 2015-11-06 MED ORDER — PRAVASTATIN SODIUM 40 MG PO TABS
40.0000 mg | ORAL_TABLET | Freq: Every evening | ORAL | Status: DC
  Administered 2015-11-06 – 2015-11-14 (×9): 40 mg
  Filled 2015-11-06 (×9): qty 1

## 2015-11-06 MED ORDER — PIPERACILLIN-TAZOBACTAM 3.375 G IVPB 30 MIN
3.3750 g | Freq: Once | INTRAVENOUS | Status: AC
  Administered 2015-11-06: 3.375 g via INTRAVENOUS
  Filled 2015-11-06: qty 50

## 2015-11-06 MED ORDER — ASPIRIN 81 MG PO CHEW
81.0000 mg | CHEWABLE_TABLET | Freq: Every day | ORAL | Status: DC
  Administered 2015-11-06 – 2015-11-15 (×10): 81 mg
  Filled 2015-11-06 (×10): qty 1

## 2015-11-06 MED ORDER — FUROSEMIDE 10 MG/ML IJ SOLN
40.0000 mg | Freq: Once | INTRAMUSCULAR | Status: AC
  Administered 2015-11-06: 40 mg via INTRAVENOUS
  Filled 2015-11-06: qty 4

## 2015-11-06 MED ORDER — CETYLPYRIDINIUM CHLORIDE 0.05 % MT LIQD
7.0000 mL | Freq: Two times a day (BID) | OROMUCOSAL | Status: DC
  Administered 2015-11-06 – 2015-11-15 (×19): 7 mL via OROMUCOSAL

## 2015-11-06 MED ORDER — CHLORHEXIDINE GLUCONATE 0.12 % MT SOLN
15.0000 mL | Freq: Two times a day (BID) | OROMUCOSAL | Status: DC
  Administered 2015-11-06 – 2015-11-15 (×18): 15 mL via OROMUCOSAL
  Filled 2015-11-06 (×18): qty 15

## 2015-11-06 MED ORDER — FLUOXETINE HCL 10 MG PO CAPS
10.0000 mg | ORAL_CAPSULE | Freq: Every day | ORAL | Status: DC
  Administered 2015-11-06 – 2015-11-08 (×3): 10 mg
  Filled 2015-11-06 (×4): qty 1

## 2015-11-06 MED ORDER — WARFARIN - PHARMACIST DOSING INPATIENT
Freq: Every day | Status: DC
Start: 2015-11-06 — End: 2015-11-11
  Administered 2015-11-06: 18:00:00
  Administered 2015-11-09: 1

## 2015-11-06 MED ORDER — VANCOMYCIN HCL IN DEXTROSE 1-5 GM/200ML-% IV SOLN
1000.0000 mg | Freq: Once | INTRAVENOUS | Status: AC
  Administered 2015-11-06: 1000 mg via INTRAVENOUS
  Filled 2015-11-06: qty 200

## 2015-11-06 MED ORDER — WARFARIN SODIUM 2 MG PO TABS
4.0000 mg | ORAL_TABLET | Freq: Once | ORAL | Status: AC
  Administered 2015-11-06: 4 mg via ORAL
  Filled 2015-11-06: qty 2

## 2015-11-06 MED ORDER — METHYLPHENIDATE HCL 5 MG PO TABS
5.0000 mg | ORAL_TABLET | Freq: Two times a day (BID) | ORAL | Status: DC
  Administered 2015-11-07 – 2015-11-15 (×17): 5 mg
  Filled 2015-11-06 (×17): qty 1

## 2015-11-06 MED ORDER — LEVETIRACETAM 100 MG/ML PO SOLN
750.0000 mg | Freq: Every day | ORAL | Status: DC
  Administered 2015-11-06 – 2015-11-15 (×10): 750 mg
  Filled 2015-11-06 (×13): qty 7.5

## 2015-11-06 MED ORDER — GUAIFENESIN ER 600 MG PO TB12
1200.0000 mg | ORAL_TABLET | Freq: Two times a day (BID) | ORAL | Status: DC
  Administered 2015-11-06 – 2015-11-07 (×2): 1200 mg via ORAL
  Filled 2015-11-06 (×3): qty 2

## 2015-11-06 MED ORDER — FUROSEMIDE 10 MG/ML IJ SOLN
40.0000 mg | Freq: Once | INTRAMUSCULAR | Status: DC
Start: 2015-11-06 — End: 2015-11-06

## 2015-11-06 MED ORDER — PIPERACILLIN-TAZOBACTAM 3.375 G IVPB
3.3750 g | Freq: Three times a day (TID) | INTRAVENOUS | Status: DC
  Administered 2015-11-06 – 2015-11-09 (×9): 3.375 g via INTRAVENOUS
  Filled 2015-11-06 (×10): qty 50

## 2015-11-06 MED ORDER — ONDANSETRON HCL 4 MG/2ML IJ SOLN: 4.0000 mg | Freq: Four times a day (QID) | INTRAMUSCULAR | Status: DC | PRN

## 2015-11-06 MED ORDER — FREE WATER
200.0000 mL | Freq: Three times a day (TID) | Status: DC
  Administered 2015-11-06 – 2015-11-15 (×26): 200 mL

## 2015-11-06 MED ORDER — FUROSEMIDE 10 MG/ML IJ SOLN
40.0000 mg | Freq: Two times a day (BID) | INTRAMUSCULAR | Status: DC
  Administered 2015-11-06 – 2015-11-07 (×3): 40 mg via INTRAVENOUS
  Filled 2015-11-06 (×4): qty 4

## 2015-11-06 MED ORDER — SODIUM CHLORIDE 0.9 % IJ SOLN
3.0000 mL | Freq: Two times a day (BID) | INTRAMUSCULAR | Status: DC
  Administered 2015-11-06 – 2015-11-15 (×13): 3 mL via INTRAVENOUS

## 2015-11-06 MED ORDER — METOPROLOL TARTRATE 25 MG PO TABS
25.0000 mg | ORAL_TABLET | Freq: Two times a day (BID) | ORAL | Status: DC
  Administered 2015-11-06 – 2015-11-15 (×19): 25 mg
  Filled 2015-11-06 (×19): qty 1

## 2015-11-06 MED ORDER — ACETAMINOPHEN 650 MG RE SUPP: 650.0000 mg | Freq: Four times a day (QID) | RECTAL | Status: DC | PRN

## 2015-11-06 MED ORDER — ALBUTEROL SULFATE (2.5 MG/3ML) 0.083% IN NEBU
2.5000 mg | INHALATION_SOLUTION | RESPIRATORY_TRACT | Status: DC | PRN
  Administered 2015-11-08 – 2015-11-14 (×2): 2.5 mg via RESPIRATORY_TRACT
  Filled 2015-11-06 (×2): qty 3

## 2015-11-06 MED ORDER — VANCOMYCIN HCL IN DEXTROSE 1-5 GM/200ML-% IV SOLN
1000.0000 mg | INTRAVENOUS | Status: DC
  Administered 2015-11-07 – 2015-11-08 (×2): 1000 mg via INTRAVENOUS
  Filled 2015-11-06 (×3): qty 200

## 2015-11-06 MED ORDER — METOCLOPRAMIDE HCL 10 MG PO TABS: 10.0000 mg | ORAL_TABLET | Freq: Four times a day (QID) | ORAL | Status: DC | PRN

## 2015-11-06 MED ORDER — LEVETIRACETAM 100 MG/ML PO SOLN
1500.0000 mg | Freq: Every day | ORAL | Status: DC
  Administered 2015-11-06 – 2015-11-14 (×9): 1500 mg
  Filled 2015-11-06 (×10): qty 15

## 2015-11-06 MED ORDER — ONDANSETRON HCL 4 MG PO TABS: 4.0000 mg | ORAL_TABLET | Freq: Four times a day (QID) | ORAL | Status: DC | PRN

## 2015-11-06 MED ORDER — WARFARIN SODIUM 2 MG PO TABS: 3.0000 mg | ORAL_TABLET | Freq: Once | ORAL | Status: DC

## 2015-11-06 MED ORDER — GUAIFENESIN-DM 100-10 MG/5ML PO SYRP: 5.0000 mL | ORAL_SOLUTION | ORAL | Status: DC | PRN

## 2015-11-06 NOTE — Progress Notes (Addendum)
ANTICOAGULATION CONSULT NOTE - Initial Consult  Pharmacy Consult forCoumadin Indication: atrial fibrillation  No Known Allergies  Patient Measurements: Height: 5\' 6"  (167.6 cm) Weight: 99 lb 3.3 oz (45 kg) IBW/kg (Calculated) : 59.3kg  Vital Signs: Temp: 98.1 F (36.7 C) (11/20 0520) Temp Source: Rectal (11/20 0520) BP: 110/55 mmHg (11/20 0805) Pulse Rate: 77 (11/20 0805)  Labs:  Recent Labs  11/04/15 0745 11/05/15 0450 11/06/15 0503 11/06/15 0508  HGB 8.0*  --  8.5*  --   HCT 27.0*  --  27.9*  --   PLT 593*  --  689*  --   LABPROT 21.1* 18.1*  --  22.1*  INR 1.83* 1.49  --  1.95*  CREATININE 0.51  --  0.54  --   TROPONINI  --   --  0.03  --     Estimated Creatinine Clearance: 43.8 mL/min (by C-G formula based on Cr of 0.54).   Medical History: Past Medical History  Diagnosis Date  . Essential hypertension, benign   . Type 2 diabetes mellitus ([place])   . Osteoporosis 06/2008  . 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 History of parotid cancer     Medications:  See med rec  Assessment: Pt presents today with new onset acute dyspnea and suspected aspiration PNA. Empiric tx with Vancomycin and Zosyn. Afebrile, elevated WBC, hgb 8.5. Chronic afib on coumadin. INR 1.95. Pt takes 4 mg daily.  Goal of Therapy:  INR 2-3 Monitor platelets by anticoagulation protocol: Yes   Plan:  Coumadin 4mg  x 1 today Daily PT/INR Monitor for S/S of bleeding  Isac Sarna, BS [place], BCPS Clinical Pharmacist Pager [phone] 11/06/2015,11:14 AM

## 2015-11-06 NOTE — ED Notes (Signed)
Pt presents to er from [facility] for sob, ? Aspiration, pt arrived to er cpap in place by ems, pt tolerating well, pulse ox mid 80's at [facility],

## 2015-11-06 NOTE — Progress Notes (Signed)
Carryover admission, will require admit orders and H&P Called and discussed with Dr. Dina Rich  74 year old female with a history of CVA, parotid cancer status post excision, complicated with respiratory failure requiring intubation and pneumothorax which required chest tube, PEG tube placement, who was recently discharged from the hospital after she was treated for acute on chronic diastolic CHF. Today patient was brought from the skilled facility after she was found to be more short of breath, had episode of vomiting, patient was brought on C Pap. In the ED chest x-ray revealed possible aspiration pneumonia, and elevated BNP 3854, lactic acid 3.1 One dose of Lasix 40 mg IV given in the ED, also empirically started on vancomycin and Zosyn. Patient will be admitted to the stepdown unit.

## 2015-11-06 NOTE — Progress Notes (Signed)
**Note De-Identified  Obfuscation** Patient tolerated transport on bipap to ICU2.  Fio2 titrated to 40% SAT 100%.  RRT to continue to monitor.

## 2015-11-06 NOTE — H&P (Signed)
History and Physical  Laura Melendez [id number] DOB: 1941/12/17 DOA: 11/06/2015  Referring physician: Dr. Thayer Jew, EDP PCP: [name], MD  Outpatient Specialists:  1. Not known  Chief Complaint: Dyspnea, suspected aspiration at SNF and hypoxia  HPI: Laura Melendez is a 74 y.o. female, recent hospitalization at [facility] 10/29/15-11/02/15 for acute hypoxic respiratory failure due to acute on chronic diastolic CHF, presented to [facility] ED from SNF on 11/06/15 with new onset of acute dyspnea, suspected aspiration of gastric contents and hypoxia in the mid 80s. As per review of records and discussion with son at bedside, patient has had a complicated and [place] course over the last 2 months. She underwent elective left parotidectomy for malignant tumor at [facility] approximately 2 months ago. She then developed wound hematoma requiring repeat surgery. This was followed by gram-negative pneumonia and VDRF. She had dysphagia and PEG tube was placed. Discharge to [facility] for rehabilitation. Rehospitalized at [facility] 10/31-11/4 for aspiration pneumonia. Discharged back to [facility] for rehabilitation. Rehospitalized to [facility] as stated above. Since her discharge back to rehabilitation, patient apparently did very well. She was not on home oxygen. She had periodic sleepiness and M.D. at SNF some medication adjustments were made following which she made further improvement. As per son, she was up in the chair couple days ago which was the first time she has done so in the last 2 months. She was also able to eat toast and Jell-O. Early 11/20 a.m., patient apparently was found lying supine in bed with difficulty breathing and suspected aspiration. She arrived to the ED with CPAP by EMS and oxygen saturations in the mid 80s at [facility]. Chest x-ray revealed worsening bibasilar opacities. She was admitted to stepdown unit for concern of aspiration  pneumonia and acute on diastolic CHF leading to acute respiratory failure with hypoxia. 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    Review of Systems: All systems reviewed and apart from history of presenting illness, are negative.  Past Medical History  Diagnosis Date  . Essential hypertension, benign   . Type 2 diabetes mellitus ([place])   . Osteoporosis 06/2008  . Stroke [facility]) 03/2015    right hemiparesis  . History of parotid cancer    Past Surgical History  Procedure Laterality Date  . Ankle surgery Left 2004    otif  . Abdominal hysterectomy    . Anterior and posterior repair N/A 03/11/2013    Procedure: ANTERIOR (CYSTOCELE) AND POSTERIOR REPAIR (RECTOCELE);  Surgeon: Linda Hedges, DO;  Location: [place] [facility];  Service: Gynecology;  Laterality: N/A;  with sacrospinous ligament fixation bilateral  . Colonoscopy  2009    negative  . Cataract extraction w/phaco Right 12/27/2014    Procedure: CATARACT EXTRACTION PHACO AND INTRAOCULAR LENS PLACEMENT RIGHT EYE;  Surgeon: Tonny Branch, MD;  Location: [facility];  Service: Ophthalmology;  Laterality: Right;  CDE:10.63  . Cataract extraction w/phaco Left 01/13/2015    Procedure: CATARACT EXTRACTION PHACO AND INTRAOCULAR LENS PLACEMENT LEFT EYE;  Surgeon: Tonny Branch, MD;  Location: [facility];  Service: Ophthalmology;  Laterality: Left;  CDE:18.87  . Cholecystectomy    .  Tee without cardioversion N/A 04/01/2015    Procedure: TRANSESOPHAGEAL ECHOCARDIOGRAM (TEE);  Surgeon: Sanda Klein, MD;  Location: [facility];  Service: Cardiovascular;  Laterality: N/A;  . Peg placement    .  Parotidectomy     Social History:  reports that she quit smoking about 8 months ago. Her smoking use included Cigarettes. She has a 7.5 pack-year smoking history. She does not have any smokeless tobacco history on file. She reports that she does not drink alcohol or use illicit drugs.   No Known Allergies  Family History  Problem Relation Age of Onset  . Cancer Father     Liver  . Cancer Sister     Breast  . Diabetes Brother   . Dementia Mother     Prior to Admission medications   Medication Sig Start Date End Date Taking? Authorizing Provider  aspirin EC 81 MG EC tablet Take 1 tablet (81 mg total) by mouth daily. 11/02/15   Erline Hau, MD  ciprofloxacin (CIPRO) 250 MG tablet Place 1 tablet (250 mg total) into feeding tube 2 (two) times daily. 11/02/15   Erline Hau, MD  clotrimazole-betamethasone (LOTRISONE) cream Apply 1 application topically daily as needed (APPLY A THIN LAYER AS BARRIER AFTER CLEANING AND DRYING BUTTOCKS AREA COMPLETELY).    Historical Provider, MD  FLUoxetine (PROZAC) 10 MG capsule TAKE 1 CAPSULE BY MOUTH DAILY FOR DEPRESSION/ANXIETY Patient taking differently: TAKE 1 CAPSULE VIA PEG DAILY FOR DEPRESSION/ANXIETY 09/15/15   [name], MD  furosemide (LASIX) 20 MG tablet 20 mg by PEG Tube route daily.    Historical Provider, MD  levETIRAcetam (KEPPRA) 750 MG tablet TAKE 1 TABLET BY MOUTH EVERY MORNING AND 2 TABLETS BY MOUTH EVERY NIGHT AT BEDTIME Patient taking differently: TAKE 1 TABLET VIA PEG EVERY MORNING AND 2 TABLETS BY MOUTH EVERY NIGHT AT BEDTIME 08/01/15   [name], MD  loperamide (IMODIUM) 2 MG capsule Take 1 capsule (2 mg total) by mouth every 4 (four) hours as needed for diarrhea or loose stools. 10/21/15   [name], MD  metFORMIN (GLUCOPHAGE) 500 MG tablet Take one half tablet BID Patient taking differently: 250 mg by PEG Tube route 2 (two) times daily with a meal.  04/25/15   [name], MD  methylphenidate  (RITALIN) 5 MG tablet Give one tablet per tube twice daily with breakfast and lunch 11/03/15   [name], DO  metoCLOPramide (REGLAN) 10 MG tablet Place 1 tablet (10 mg total) into feeding tube every 6 (six) hours as needed for nausea. 10/21/15   [name], MD  metoprolol tartrate (LOPRESSOR) 25 MG tablet Take 25 mg by mouth 2 (two) times daily.    Historical Provider, MD  Nutritional Supplements (FEEDING SUPPLEMENT, VITAL AF 1.2 CAL,) LIQD Place 1,000 mLs into feeding tube daily. Patient not taking: Reported on 10/29/2015 10/21/15   [name], MD  nystatin (MYCOSTATIN/NYSTOP) 100000 UNIT/GM POWD Apply topically 3 (three) times daily. **APPLY A DUSTING TO RASH ON PERINEUM AND BUTTOCKS AT [employer] SHIFT    Historical Provider, MD  ondansetron (ZOFRAN ODT) 4 MG disintegrating tablet Take 1 tablet (4 mg total) by mouth every 8 (eight) hours as needed for nausea or vomiting. Patient taking differently: 4 mg by PEG Tube route every 8 (eight) hours as needed for nausea or vomiting.  10/08/15   [name], MD  pravastatin (PRAVACHOL) 40 MG tablet Take 1 tablet (40 mg total) by mouth daily. Patient taking differently: 40 mg by  PEG Tube route every evening.  07/20/15   [name], MD  warfarin (COUMADIN) 3 MG tablet 3 mg by PEG Tube route daily.    Historical Provider, MD  Water For Irrigation, Sterile (FREE WATER) SOLN Place 200 mLs into feeding tube every 8 (eight) hours. 10/21/15   [name], MD   Physical Exam: Filed Vitals:   11/06/15 [id number] 11/06/15 0649 11/06/15 0750 11/06/15 0805  BP: 101/50 112/50  110/55  Pulse: 81  82 77  Temp:      TempSrc:      Resp: 17  16   Height:    5\' 6"  (1.676 m)  Weight:    45 kg (99 lb 3.3 oz)  SpO2:   100% 100%   temperature 98.37F.   General exam: Moderately built and thinly nourished pleasant elderly female patient, lying comfortably popped up in bed without distress. Had BiPAP on-transitioned to nasal cannula oxygen and continues to maintain  saturations close to 100%.  Head, eyes and ENT: Nontraumatic and normocephalic. Pupils equally reacting to light and accommodation. Oral mucosa moist. Healed left parotidectomy scar.  Neck: Supple. No JVD, carotid bruit or thyromegaly.  Lymphatics: No lymphadenopathy.  Respiratory system: Reduced breath sounds in the bases with scattered few basal crackles. Rest of lung fields clear to auscultation. No increased work of breathing.  Cardiovascular system: S1 and S2 heard, RRR. No JVD, murmurs, gallops, clicks or pedal edema.  Gastrointestinal system: Abdomen is nondistended, soft and nontender. Normal bowel sounds heard. No organomegaly or masses appreciated. PEG tube intact. Foley catheter + (recently changed at [facility] on 11/19)  Central nervous system: Alert and oriented 2. No focal neurological deficits.  Extremities: Moves all limbs symmetrically. At least grade 4 x 5 power in upper extremities and grade 3 x 5 power in lower extremities. Peripheral pulses symmetrically felt.   Skin: Some excoriation of the skin in the mid sacral area without ulceration.  Musculoskeletal system: Negative exam.  Psychiatry: Pleasant and cooperative.   Labs on Admission:  Basic Metabolic Panel:  Recent Labs Lab 10/31/15 0444 11/01/15 0427 11/02/15 0614 11/04/15 0745 11/06/15 0503  NA 138 140 142 139 141  K 3.8 3.8 3.6 4.2 4.4  CL 104 107 107 104 105  CO2 28 28 28 29 26   GLUCOSE 127* 143* 138* 150* 262*  BUN 39* 35* 26* 31* 28*  CREATININE 0.53 0.49 0.35* 0.51 0.54  CALCIUM 8.0* 7.9* 8.4* 8.3* 8.8*   Liver Function Tests:  Recent Labs Lab 11/04/15 0745  AST 17  ALT 17  ALKPHOS 42  BILITOT 0.3  PROT 5.1*  ALBUMIN 2.0*   No results for input(s): LIPASE, AMYLASE in the last 168 hours. No results for input(s): AMMONIA in the last 168 hours. CBC:  Recent Labs Lab 10/31/15 0444 11/01/15 0427 11/02/15 0614 11/04/15 0745 11/06/15 0503  WBC 13.7* 7.7 8.4 8.6 11.5*  NEUTROABS   --   --   --   --  7.1  HGB 8.2* 7.6* 7.7* 8.0* 8.5*  HCT 26.5* 24.5* 25.4* 27.0* 27.9*  MCV 82.8 83.1 84.7 85.4 83.3  PLT 566* 523* 583* 593* 689*   Cardiac Enzymes:  Recent Labs Lab 10/30/15 1114 11/06/15 0503  TROPONINI 0.05* 0.03    BNP (last 3 results) No results for input(s): PROBNP in the last 8760 hours. CBG:  Recent Labs Lab 11/02/15 0014 11/02/15 0455 11/02/15 0757 11/02/15 1110 11/02/15 1635  GLUCAP 141* 145* 144* 163* 135*    Radiological Exams on  Admission: Dg Chest Portable 1 View  11/06/2015  CLINICAL DATA:  Shortness of breath. EXAM: PORTABLE CHEST 1 VIEW COMPARISON:  10/29/2015 FINDINGS: Worsening bibasilar opacities from prior with developing haziness suggestive of pleural effusions. Vascular congestion is unchanged. Cardiomediastinal contours with mild cardiomegaly are unchanged. No pneumothorax. The bones are under mineralized IMPRESSION: Worsening bibasilar opacities with developing lower lobe haziness suggestive of pleural effusions. Aspiration, pneumonia, or pleural effusions with compressive atelectasis. Electronically Signed   By: Jeb Levering M.D.   On: 11/06/2015 05:32    EKG: Independently reviewed. Poor quality EKG. Baseline artifact. Heart rate 126/m-seems sinus tachycardia, normal axis, Q waves in leads V1-2, T-wave inversions in lead V5-6 and no acute changes. QTC 440 ms.  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 Patient to be nursed head up while on tube feeds. - Treating empirically with IV vancomycin and Zosyn - Check Procalcitonin  Acute on chronic diastolic CHF - Change Lasix to 40 mg IV every 12 hourly. - BNP on admission: 3854  Acute respiratory failure with hypoxia - Secondary to aspiration pneumonia and decompensated CHF. - Presented with oxygen saturations in the mid 80s. Transiently placed on BiPAP. - Improved. Transitioned to oxygen via nasal cannula. Wean off oxygen as tolerated.  Essential hypertension - Controlled. Resume prior home medications after pharmacy has reconsulted all her home medications.  Type II DM - Sliding-scale insulin.  Chronic atrial fibrillation - Continue Coumadin per pharmacy and metoprolol after ascertaining home dose.  Dysphagia status post PEG tube - Resume PEG tube when details available from SNF. Dietitian consultation for tube feed management. Speech therapy consultation to evaluate swallowing.  Urinary retention - Has had chronic indwelling Foley catheter for the last 2 months. Last changed on 11/19 at SNF.  CVA with residual right hemiparesis - Continue Coumadin anticoagulation. PT and OT evaluation  Possible seizure disorder - Continue Keppra  Chronic anemia - Stable   Left parotidectomy for malignant tumor  Thrombocytosis, possibly reactive  Prolonged QTC - Repeat EKG. Continue monitoring on telemetry. Potassium 4.4. Check magnesium. Minimize QT prolonging medications.   DVT prophylaxis: On full anticoagulation with Coumadin Code Status: Full  Family Communication: Discussed with son at bedside.  Disposition Plan: DC back to SNF when medically stable, possibly in 3-4 days.   Time spent: 70 minutes.  Vernell Leep, MD, FACP, FHM. [facility] Pager [phone]  If 7PM-7AM, please contact night-coverage [email or web address] Password [email or web address] 11/06/2015, 10:50 AM

## 2015-11-06 NOTE — Progress Notes (Signed)
Late entry: At 1545 Patient would not wake enough to attempt instruction on IS.

## 2015-11-06 NOTE — Progress Notes (Signed)
ANTIBIOTIC CONSULT NOTE - INITIAL  Pharmacy Consult for Vancomycin and Zosyn Indication: pneumonia  No Known Allergies  Patient Measurements: Height: 5\' 6"  (167.6 cm) Weight: 99 lb 3.3 oz (45 kg) IBW/kg (Calculated) : 59.3  Vital Signs: Temp: 98.1 F (36.7 C) (11/20 0520) Temp Source: Rectal (11/20 0520) BP: 110/55 mmHg (11/20 0805) Pulse Rate: 77 (11/20 0805) Intake/Output from previous day:   Intake/Output from this shift:    Labs:  Recent Labs  11/04/15 0745 11/06/15 0503  WBC 8.6 11.5*  HGB 8.0* 8.5*  PLT 593* 689*  CREATININE 0.51 0.54   Estimated Creatinine Clearance: 43.8 mL/min (by C-G formula based on Cr of 0.54). No results for input(s): VANCOTROUGH, VANCOPEAK, VANCORANDOM, GENTTROUGH, GENTPEAK, GENTRANDOM, TOBRATROUGH, TOBRAPEAK, TOBRARND, AMIKACINPEAK, AMIKACINTROU, AMIKACIN in the last 72 hours.   Microbiology: Recent Results (from the past 720 hour(s))  Urine culture     Status: None   Collection Time: 10/08/15 10:41 AM  Result Value Ref Range Status   Specimen Description URINE, CATHETERIZED  Final   Special Requests NONE  Final   Culture   Final    >=100,000 COLONIES/mL YEAST Performed at [facility]    Report Status 10/18/2015 FINAL  Final  C difficile quick scan w PCR reflex     Status: None   Collection Time: 10/08/15 12:06 PM  Result Value Ref Range Status   C Diff antigen NEGATIVE NEGATIVE Final   C Diff toxin NEGATIVE NEGATIVE Final   C Diff interpretation Negative for toxigenic C. difficile  Final  Wound culture     Status: None   Collection Time: 10/09/15  1:40 PM  Result Value Ref Range Status   Specimen Description NECK  Final   Special Requests Immunocompromised  Final   Gram Stain   Final    NO WBC SEEN NO SQUAMOUS EPITHELIAL CELLS SEEN NO ORGANISMS SEEN Performed at [facility]    Culture   Final    ABUNDANT DIPHTHEROIDS(CORYNEBACTERIUM SPECIES) Note: Standardized susceptibility testing for this organism  is not available. Performed at [facility]    Report Status 10/11/2015 FINAL  Final  Culture, blood (routine x 2)     Status: None   Collection Time: 10/17/15  9:47 AM  Result Value Ref Range Status   Specimen Description BLOOD RIGHT ANTECUBITAL DRAWN BY RN  Final   Special Requests BOTTLES DRAWN AEROBIC AND ANAEROBIC [place]  Final   Culture NO GROWTH 5 DAYS  Final   Report Status 10/22/2015 FINAL  Final  Culture, blood (routine x 2)     Status: None   Collection Time: 10/17/15 10:00 AM  Result Value Ref Range Status   Specimen Description BLOOD LEFT ANTECUBITAL  Final   Special Requests BOTTLES DRAWN AEROBIC AND ANAEROBIC [place]  Final   Culture NO GROWTH 5 DAYS  Final   Report Status 10/22/2015 FINAL  Final  Urine culture     Status: None   Collection Time: 10/17/15 10:05 AM  Result Value Ref Range Status   Specimen Description URINE, CATHETERIZED  Final   Special Requests NONE  Final   Culture   Final    >=100,000 COLONIES/mL YEAST Performed at [facility]    Report Status 10/18/2015 FINAL  Final  MRSA PCR Screening     Status: None   Collection Time: 10/17/15  2:00 PM  Result Value Ref Range Status   MRSA by PCR NEGATIVE NEGATIVE Final    Comment:  The GeneXpert MRSA Assay (FDA approved for NASAL specimens only), is one component of a comprehensive MRSA colonization surveillance program. It is not intended to diagnose MRSA infection nor to guide or monitor treatment for MRSA infections.   Stool culture     Status: None   Collection Time: 10/19/15 12:00 AM  Result Value Ref Range Status   Specimen Description STOOL  Final   Special Requests NONE  Final   Culture   Final    NO SALMONELLA, SHIGELLA, CAMPYLOBACTER, YERSINIA, OR E.COLI 0157:H7 ISOLATED Performed at [facility]    Report Status 10/23/2015 FINAL  Final  C difficile quick scan w PCR reflex     Status: None   Collection Time: 10/19/15 12:00 AM  Result Value Ref  Range Status   C Diff antigen NEGATIVE NEGATIVE Final   C Diff toxin NEGATIVE NEGATIVE Final   C Diff interpretation Negative for toxigenic C. difficile  Final  Stool culture     Status: None   Collection Time: 10/23/15 11:00 AM  Result Value Ref Range Status   Specimen Description STOOL  Final   Special Requests NONE  Final   Culture   Final    ABUNDANT YEAST NO SALMONELLA, SHIGELLA, CAMPYLOBACTER, YERSINIA, OR E.COLI 0157:H7 ISOLATED Note: REDUCED NORMAL FLORA PRESENT Performed at [facility]    Report Status 10/28/2015 FINAL  Final  C difficile quick scan w PCR reflex     Status: None   Collection Time: 10/23/15 11:00 AM  Result Value Ref Range Status   C Diff antigen NEGATIVE NEGATIVE Final   C Diff toxin NEGATIVE NEGATIVE Final   C Diff interpretation Negative for toxigenic C. difficile  Final  Blood culture (routine x 2)     Status: None   Collection Time: 10/29/15  8:40 PM  Result Value Ref Range Status   Specimen Description BLOOD RIGHT ANTECUBITAL  Final   Special Requests   Final    BOTTLES DRAWN AEROBIC AND ANAEROBIC AEB=4CC ANA=3CC   Culture NO GROWTH 5 DAYS  Final   Report Status 11/03/2015 FINAL  Final  Blood culture (routine x 2)     Status: None   Collection Time: 10/29/15  8:43 PM  Result Value Ref Range Status   Specimen Description BLOOD LEFT ANTECUBITAL  Final   Special Requests BOTTLES DRAWN AEROBIC AND ANAEROBIC 3CC EACH  Final   Culture NO GROWTH 5 DAYS  Final   Report Status 11/03/2015 FINAL  Final  Culture, Urine     Status: None   Collection Time: 10/31/15 10:33 AM  Result Value Ref Range Status   Specimen Description URINE, CATHETERIZED  Final   Special Requests NONE  Final   Culture   Final    >=100,000 COLONIES/mL ESCHERICHIA COLI Performed at [facility]    Report Status 11/02/2015 FINAL  Final   Organism ID, Bacteria ESCHERICHIA COLI  Final      Susceptibility   Escherichia coli - MIC*    AMPICILLIN <=2 SENSITIVE  Sensitive     CEFAZOLIN <=4 SENSITIVE Sensitive     CEFTRIAXONE <=1 SENSITIVE Sensitive     CIPROFLOXACIN <=0.25 SENSITIVE Sensitive     GENTAMICIN <=1 SENSITIVE Sensitive     IMIPENEM <=0.25 SENSITIVE Sensitive     NITROFURANTOIN <=16 SENSITIVE Sensitive     TRIMETH/SULFA <=20 SENSITIVE Sensitive     AMPICILLIN/SULBACTAM <=2 SENSITIVE Sensitive     PIP/TAZO <=4 SENSITIVE Sensitive     * >=100,000 COLONIES/mL ESCHERICHIA COLI  Culture, blood (routine x 2)     Status: None (Preliminary result)   Collection Time: 11/06/15  6:56 AM  Result Value Ref Range Status   Specimen Description BLOOD  Final   Special Requests NONE  Final   Culture NO GROWTH <12 HOURS  Final   Report Status PENDING  Incomplete  Culture, blood (routine x 2)     Status: None (Preliminary result)   Collection Time: 11/06/15  7:04 AM  Result Value Ref Range Status   Specimen Description BLOOD  Final   Special Requests NONE  Final   Culture NO GROWTH <12 HOURS  Final   Report Status PENDING  Incomplete    Medical History: Past Medical History  Diagnosis Date  . Essential hypertension, benign   . Type 2 diabetes mellitus ([place])   . Osteoporosis 06/2008  . Stroke [facility]) 03/2015    right hemiparesis  . History of parotid cancer     Medications:  See med rec Assessment: 74 yo NH resident recently hospitalized at [facility] 11/12-11/16/16 for acute hypoxic respiratory failure. Pt presents today with new onset acute dyspnea and suspected aspiration PNA. Empiric tx with Vancomycin and Zosyn.   Goal of Therapy:  Vancomycin trough level 15-20 mcg/ml  Plan:  Vancomycin 1gm IV q24h Zosyn 3.375gm IV q8h, extended dosing interval Measure antibiotic drug levels at steady state Follow up culture results  Monitor V/S and labs  Isac Sarna, BS [place], BCPS Clinical Pharmacist Pager [phone] 11/06/2015,10:59 AM

## 2015-11-06 NOTE — ED Notes (Signed)
EKG done and given to Dr Dina Rich. Patient on cardiac monitoring at this time.

## 2015-11-06 NOTE — Progress Notes (Signed)
Patient unable to follow instructions for use of Incentive spirometer. She was able to take some deeper breaths with coaching from family. Patient indicated she was tired,I will try to instruct again after patient has rested.

## 2015-11-06 NOTE — ED Provider Notes (Addendum)
CSN: [id number]     Arrival date & time 11/06/15  0456 History   First MD Initiated Contact with Patient 11/06/15 0453     Chief Complaint  Patient presents with  . Shortness of Breath     (Consider location/radiation/quality/duration/timing/severity/associated sxs/prior Treatment) HPI  This is a 74 year old female with a complicated recent medical history who presents with shortness of breath. Patient is currently at the [facility].  She had a parotidectomy in October. Postoperatively she developed aspiration pneumonia, pneumothorax requiring chest tube and ICU stay. She had a recurrent aspiration event and pneumonia with sepsis approximately 3 weeks ago. On November 12 she was readmitted to the hospital with respiratory distress thought to be secondary to volume overload and diastolic heart failure. Also found to have UTI.  She was diuresed and discharged back to the [facility] on Lasix and Cipro. Patient is unable to provide much history. Per EMS, they were called out to the [facility] for "aspiration."  On further investigation, the patient called out reporting that she was short of breath. She had low O2 sats. Upon EMS arrival O2 sats were 86%. Patient was found to be wheezing and was placed on CPAP by EMS. No recent coughs or fevers.  Lower 5 caveat.  Past Medical History  Diagnosis Date  . Essential hypertension, benign   . Type 2 diabetes mellitus ([place])   . Osteoporosis 06/2008  . 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 History of parotid cancer    Past Surgical History  Procedure Laterality Date  . Ankle surgery Left 2004    otif  . Abdominal hysterectomy    . Anterior and posterior repair N/A 03/11/2013    Procedure: ANTERIOR (CYSTOCELE) AND POSTERIOR REPAIR (RECTOCELE);  Surgeon: Linda Hedges, DO;  Location: [place] [facility];  Service: Gynecology;  Laterality: N/A;  with sacrospinous ligament fixation bilateral  . Colonoscopy  2009    negative  . Cataract extraction w/phaco  Right 12/27/2014    Procedure: CATARACT EXTRACTION PHACO AND INTRAOCULAR LENS PLACEMENT RIGHT EYE;  Surgeon: Tonny Branch, MD;  Location: [facility];  Service: Ophthalmology;  Laterality: Right;  CDE:10.63  . Cataract extraction w/phaco Left 01/13/2015    Procedure: CATARACT EXTRACTION PHACO AND INTRAOCULAR LENS PLACEMENT LEFT EYE;  Surgeon: Tonny Branch, MD;  Location: [facility];  Service: Ophthalmology;  Laterality: Left;  CDE:18.87  . Cholecystectomy    . Tee without cardioversion N/A 04/01/2015    Procedure: TRANSESOPHAGEAL ECHOCARDIOGRAM (TEE);  Surgeon: Sanda Klein, MD;  Location: [facility] ENDOSCOPY;  Service: Cardiovascular;  Laterality: N/A;  . Peg placement    . Parotidectomy     Family History  Problem Relation Age of Onset  . Cancer Father     Liver  . Cancer Sister     Breast  . Diabetes Brother   . Dementia Mother    Social History  Substance Use Topics  . Smoking status: Former Smoker -- 0.25 packs/day for 30 years    Types: Cigarettes    Quit date: 02/27/2015  . Smokeless tobacco: None  . Alcohol Use: No   OB History    No data available     Review of Systems  Unable to perform ROS: Acuity of condition      Allergies  Review of patient's allergies indicates no known allergies.  Home Medications   Prior to Admission medications   Medication Sig Start Date End Date Taking? Authorizing Provider  aspirin EC 81 MG EC tablet Take 1  tablet (81 mg total) by mouth daily. 11/02/15   Erline Hau, MD  ciprofloxacin (CIPRO) 250 MG tablet Place 1 tablet (250 mg total) into feeding tube 2 (two) times daily. 11/02/15   Erline Hau, MD  clotrimazole-betamethasone (LOTRISONE) cream Apply 1 application topically daily as needed (APPLY A THIN LAYER AS BARRIER AFTER CLEANING AND DRYING BUTTOCKS AREA COMPLETELY).    Historical Provider, MD  FLUoxetine (PROZAC) 10 MG capsule TAKE 1 CAPSULE BY MOUTH DAILY FOR DEPRESSION/ANXIETY Patient taking differently: TAKE 1  CAPSULE VIA PEG DAILY FOR DEPRESSION/ANXIETY 09/15/15   [name], MD  furosemide (LASIX) 20 MG tablet 20 mg by PEG Tube route daily.    Historical Provider, MD  levETIRAcetam (KEPPRA) 750 MG tablet TAKE 1 TABLET BY MOUTH EVERY MORNING AND 2 TABLETS BY MOUTH EVERY NIGHT AT BEDTIME Patient taking differently: TAKE 1 TABLET VIA PEG EVERY MORNING AND 2 TABLETS BY MOUTH EVERY NIGHT AT BEDTIME 08/01/15   [name], MD  loperamide (IMODIUM) 2 MG capsule Take 1 capsule (2 mg total) by mouth every 4 (four) hours as needed for diarrhea or loose stools. 10/21/15   [name], MD  metFORMIN (GLUCOPHAGE) 500 MG tablet Take one half tablet BID Patient taking differently: 250 mg by PEG Tube route 2 (two) times daily with a meal.  04/25/15   [name], MD  methylphenidate (RITALIN) 5 MG tablet Give one tablet per tube twice daily with breakfast and lunch 11/03/15   [name], DO  metoCLOPramide (REGLAN) 10 MG tablet Place 1 tablet (10 mg total) into feeding tube every 6 (six) hours as needed for nausea. 10/21/15   [name], MD  metoprolol tartrate (LOPRESSOR) 25 MG tablet Take 25 mg by mouth 2 (two) times daily.    Historical Provider, MD  Nutritional Supplements (FEEDING SUPPLEMENT, VITAL AF 1.2 CAL,) LIQD Place 1,000 mLs into feeding tube daily. Patient not taking: Reported on 10/29/2015 10/21/15   [name], MD  nystatin (MYCOSTATIN/NYSTOP) 100000 UNIT/GM POWD Apply topically 3 (three) times daily. **APPLY A DUSTING TO RASH ON PERINEUM AND BUTTOCKS AT [facility] SHIFT    Historical Provider, MD  ondansetron (ZOFRAN ODT) 4 MG disintegrating tablet Take 1 tablet (4 mg total) by mouth every 8 (eight) hours as needed for nausea or vomiting. Patient taking differently: 4 mg by PEG Tube route every 8 (eight) hours as needed for nausea or vomiting.  10/08/15   [name], MD  pravastatin (PRAVACHOL) 40 MG tablet Take 1 tablet (40 mg total) by mouth daily. Patient taking differently: 40 mg by PEG  Tube route every evening.  07/20/15   [name], MD  warfarin (COUMADIN) 3 MG tablet 3 mg by PEG Tube route daily.    Historical Provider, MD  Water For Irrigation, Sterile (FREE WATER) SOLN Place 200 mLs into feeding tube every 8 (eight) hours. 10/21/15   [name], MD   BP 105/57 mmHg  Pulse 94  Temp(Src) 98.1 F (36.7 C) (Rectal)  Resp 19  Wt 94 lb 9 oz (42.893 kg)  SpO2 100% Physical Exam  Constitutional: She is oriented to person, place, and time. No distress.  Ill-appearing, tachypnea  HENT:  Head: Normocephalic and atraumatic.  Mouth/Throat: Oropharynx is clear and moist.  Eyes: Pupils are equal, round, and reactive to light.  Cardiovascular: Regular rhythm and normal heart sounds.   Tachycardia  Pulmonary/Chest: She is in respiratory distress. She has wheezes.  Increased work of breathing, accessory muscle use,  diffuse wheezing in all lung fields  Abdominal: Soft. Bowel sounds are normal. There is no tenderness. There is no rebound.  PEG  Musculoskeletal:  No lower extremity noted, there is pitting edema of the elbows  Neurological: She is alert and oriented to person, place, and time.  Follows commands, nods her head to yes and no questions, unable to fully assess secondary to respiratory distress  Skin: Skin is warm.  Diaphoresis  Psychiatric: She has a normal mood and affect.  Nursing note and vitals reviewed.   ED Course  Procedures (including critical care time)  CRITICAL CARE Performed by: Merryl Hacker   Total critical care time: 45 minutes  Critical care time was exclusive of separately billable procedures and treating other patients.  Critical care was necessary to treat or prevent imminent or life-threatening deterioration.  Critical care was time spent personally by me on the following activities: development of treatment plan with patient and/or surrogate as well as nursing, discussions with consultants, evaluation of patient's response  to treatment, examination of patient, obtaining history from patient or surrogate, ordering and performing treatments and interventions, ordering and review of laboratory studies, ordering and review of radiographic studies, pulse oximetry and re-evaluation of patient's condition.  Labs Review Labs Reviewed  CBC WITH DIFFERENTIAL/PLATELET - Abnormal; Notable for the following:    WBC 11.5 (*)    RBC 3.35 (*)    Hemoglobin 8.5 (*)    HCT 27.9 (*)    MCH 25.4 (*)    RDW 16.5 (*)    Platelets 689 (*)    All other components within normal limits  BASIC METABOLIC PANEL - Abnormal; Notable for the following:    Glucose, Bld 262 (*)    BUN 28 (*)    Calcium 8.8 (*)    All other components within normal limits  BRAIN NATRIURETIC PEPTIDE - Abnormal; Notable for the following:    B Natriuretic Peptide 3854.0 (*)    All other components within normal limits  BLOOD GAS, ARTERIAL - Abnormal; Notable for the following:    pO2, Arterial 118.0 (*)    Allens test (pass/fail) NOT INDICATED (*)    All other components within normal limits  LACTIC ACID, PLASMA - Abnormal; Notable for the following:    Lactic Acid, Venous 3.1 (*)    All other components within normal limits  PROTIME-INR - Abnormal; Notable for the following:    Prothrombin Time 22.1 (*)    INR 1.95 (*)    All other components within normal limits  CULTURE, BLOOD (ROUTINE X 2)  CULTURE, BLOOD (ROUTINE X 2)  URINE CULTURE  TROPONIN I  LACTIC ACID, PLASMA  URINALYSIS, ROUTINE W REFLEX MICROSCOPIC (NOT AT [facility])    Imaging Review Dg Chest Portable 1 View  11/06/2015  CLINICAL DATA:  Shortness of breath. EXAM: PORTABLE CHEST 1 VIEW COMPARISON:  10/29/2015 FINDINGS: Worsening bibasilar opacities from prior with developing haziness suggestive of pleural effusions. Vascular congestion is unchanged. Cardiomediastinal contours with mild cardiomegaly are unchanged. No pneumothorax. The bones are under mineralized IMPRESSION: Worsening  bibasilar opacities with developing lower lobe haziness suggestive of pleural effusions. Aspiration, pneumonia, or pleural effusions with compressive atelectasis. Electronically Signed   By: Jeb Levering M.D.   On: 11/06/2015 05:32   I have personally reviewed and evaluated these images and lab results as part of my medical decision-making.   EKG Interpretation   Date/Time:  Sunday November 06 2015 05:09:59 EST Ventricular Rate:  126 PR Interval:  102 QRS Duration: 78 QT Interval:  373 QTC Calculation: 540 R Axis:   103 Text Interpretation:  Sinus or ectopic atrial tachycardia Probable  anterolateral infarct, age indeterm Prolonged QT interval No significant  change since last tracing Confirmed by Leida Luton  MD, [place] ([id number]) on  11/06/2015 5:23:09 AM      MDM   Final diagnoses:  Acute on chronic diastolic (congestive) heart failure (HCC)  Aspiration pneumonia due to vomit, unspecified laterality, unspecified part of lung [facility])    Patient presents with acute respiratory distress. Hypoxia. Similar presentation and was just discharged several days ago. At that time she was diuresed for presumed heart failure. She is currently afebrile. She does have a mild leukocytosis. Reported vomiting and risk for aspiration. Blood gases reassuring. Chest x-ray concerning for worsening bibasilar opacities with a left lower lobe haziness concerning for pneumonia versus effusion.  Given clinical picture, will cover with vancomycin and Zosyn. Lactate is minimally elevated at 3.1. Per the patient's family, during her last admission most of her swelling was in her arms. She does have some pitting edema around her elbows. BNP 3854.  Patient is clinically stable and blood pressures reassuring. Patient was given 40 mg of IV Lasix in an attempt to diurese. Clinical picture somewhat mixed but likely a combination of heart failure and/or aspiration pneumonia. Will admit for further management.  Discussed with  Dr. Darrick Meigs.    Merryl Hacker, MD 11/06/15 [id number]  Merryl Hacker, MD 11/06/15 0630  Merryl Hacker, MD 11/06/15 [phone]

## 2015-11-07 ENCOUNTER — Inpatient Hospital Stay (HOSPITAL_COMMUNITY): Payer: Medicare HMO

## 2015-11-07 LAB — GLUCOSE, CAPILLARY
GLUCOSE-CAPILLARY: 155 mg/dL — AB (ref 65–99)
GLUCOSE-CAPILLARY: 142 mg/dL — AB (ref 65–99)
Glucose-Capillary: 121 mg/dL — ABNORMAL HIGH (ref 65–99)
Glucose-Capillary: 133 mg/dL — ABNORMAL HIGH (ref 65–99)
Glucose-Capillary: 135 mg/dL — ABNORMAL HIGH (ref 65–99)

## 2015-11-07 LAB — CBC
HCT: 25.1 % — ABNORMAL LOW (ref 36.0–46.0)
Hemoglobin: 7.5 g/dL — ABNORMAL LOW (ref 12.0–15.0)
MCH: 25.1 pg — AB (ref 26.0–34.0)
MCHC: 29.9 g/dL — AB (ref 30.0–36.0)
MCV: 83.9 fL (ref 78.0–100.0)
PLATELETS: 572 10*3/uL — AB (ref 150–400)
RBC: 2.99 MIL/uL — AB (ref 3.87–5.11)
RDW: 16.7 % — ABNORMAL HIGH (ref 11.5–15.5)
WBC: 9.2 10*3/uL (ref 4.0–10.5)

## 2015-11-07 LAB — PROCALCITONIN

## 2015-11-07 LAB — BASIC METABOLIC PANEL
Anion gap: 8 (ref 5–15)
BUN: 31 mg/dL — AB (ref 6–20)
CALCIUM: 8.3 mg/dL — AB (ref 8.9–10.3)
CHLORIDE: 103 mmol/L (ref 101–111)
CO2: 28 mmol/L (ref 22–32)
CREATININE: 0.66 mg/dL (ref 0.44–1.00)
GFR calc non Af Amer: >60 mL/min (ref >60)
Glucose, Bld: 144 mg/dL — ABNORMAL HIGH (ref 65–99)
Potassium: 4 mmol/L (ref 3.5–5.1)
Sodium: 139 mmol/L (ref 135–145)

## 2015-11-07 LAB — PROTIME-INR
INR: 2.99 — ABNORMAL HIGH (ref 0.00–1.49)
PROTHROMBIN TIME: 30.5 s — AB (ref 11.6–15.2)

## 2015-11-07 MED ORDER — GUAIFENESIN-DM 100-10 MG/5ML PO SYRP
5.0000 mL | ORAL_SOLUTION | ORAL | Status: DC | PRN
Start: 2015-11-07 — End: 2015-11-15

## 2015-11-07 MED ORDER — LORAZEPAM 2 MG/ML IJ SOLN
0.5000 mg | Freq: Once | INTRAMUSCULAR | Status: AC
  Administered 2015-11-07: 0.5 mg via INTRAVENOUS
  Filled 2015-11-07: qty 1

## 2015-11-07 NOTE — Evaluation (Signed)
Clinical/Bedside Swallow Evaluation Patient Details  Name: Laura Melendez MRN: [id number] Date of Birth: Oct 13, 1941  Today's Date: 11/07/2015 Time: SLP Start Time (ACUTE ONLY): 1200 SLP Stop Time (ACUTE ONLY): [id number] SLP Time Calculation (min) (ACUTE ONLY): 44 min  Past Medical History:  Past Medical History  Diagnosis Date  . Essential hypertension, benign   . Type 2 diabetes mellitus ([place])   . Osteoporosis 06/2008  . 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 History of parotid cancer    Past Surgical History:  Past Surgical History  Procedure Laterality Date  . Ankle surgery Left 2004    otif  . Abdominal hysterectomy    . Anterior and posterior repair N/A 03/11/2013    Procedure: ANTERIOR (CYSTOCELE) AND POSTERIOR REPAIR (RECTOCELE);  Surgeon: Linda Hedges, DO;  Location: [place] [facility];  Service: Gynecology;  Laterality: N/A;  with sacrospinous ligament fixation bilateral  . Colonoscopy  2009    negative  . Cataract extraction w/phaco Right 12/27/2014    Procedure: CATARACT EXTRACTION PHACO AND INTRAOCULAR LENS PLACEMENT RIGHT EYE;  Surgeon: Tonny Branch, MD;  Location: [facility];  Service: Ophthalmology;  Laterality: Right;  CDE:10.63  . Cataract extraction w/phaco Left 01/13/2015    Procedure: CATARACT EXTRACTION PHACO AND INTRAOCULAR LENS PLACEMENT LEFT EYE;  Surgeon: Tonny Branch, MD;  Location: [facility];  Service: Ophthalmology;  Laterality: Left;  CDE:18.87  . Cholecystectomy    . Tee without cardioversion N/A 04/01/2015    Procedure: TRANSESOPHAGEAL ECHOCARDIOGRAM (TEE);  Surgeon: Sanda Klein, MD;  Location: [facility] ENDOSCOPY;  Service: Cardiovascular;  Laterality: N/A;  . Peg placement    . 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 She was admitted to stepdown unit for aspiration pneumonia, decompensated diastolic CHF and acute respiratory failure with hypoxia. Improved. Transferred to telemetry on 11/21. Reports of pt being found supine in bed at SNF on Saturday with worry for aspiration event (tube feeds and emesis). SLP asked to evaluate pt at bedside due to history of dysphagia.   Assessment / Plan / Recommendation Clinical Impression  Pt known to this SLP from previous admission (Iast week). Chart reviewed extensively due to prolonged and complicated recent past hospitalizations and surgeries. In short, pt currently presenting with dysphagia that is multifactorial in terms of cause. Pt had dysphagia after her stroke in April which mostly resolved, however this previous stroke puts her at an increased risk for dysphagia in setting of medical decline. Pt with history of left cerebellar, right thalamic, left frontal infarcts, and subclinical seizures. 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 Nicolette Bang at [place] developled complications following the surgery which required intubation and eventually PEG placement 10/03/2015. Although pt was reportedly cleared to begin a puree diet with thin liquids prior to discharge from [facility], pt still at significant risk for aspiration due to  prolonged hospitalization, weakness, likely depression, CN VII dissection with resultant weakness on left side, history of previous strokes with dysphagia, and  delay in swallow initiation. She has had a difficult time tolerating tube feeds as evidenced by recent hospitalizations with suspected aspiration of tube feeds (pt with nausea and vomiting associated with tube feedings). Pt was started on Reglan (10/19/2015) in addition to Zofran, however Dr. Freddie Apley note on 10/29/2015 recommending discontinuing Reglan due to possible extrapyramidal symptoms associated with prolonged use. Today, at bedside, pt tolerated puree and honey-thick liquids via teaspoon. Pt required moderate verbal cues to "swallow" when pt orally held bolus at times. Pt with mild left sided facial weakness likely due to recent surgery and benefited from cues to "suck, slurp, and swallow" and to "move your tongue". Pt tolerated cup sips HTL when SLP provided close tactile cues to regulate amount and rate. Pt's daughter and husband arrived after my visit, however I reviewed session and recommendations with them. Daughter wonders whether they can get rid of the tube and just try eating by mouth. I recommended that we initiate D1/puree diet with honey-thick liquids via teaspoon with 100% feeder assist by staff as long as pt is alert and upright. HOB should always be at least 30 degrees due to continuous tube feeds via PEG. Will order D1/HTL.    Aspiration Risk  Moderate aspiration risk    Diet Recommendation     Medication Administration: Via alternative means (or crush with puree)    Other  Recommendations Oral Care Recommendations: Staff/trained caregiver to provide oral care;Oral care before and after PO Other Recommendations: Order thickener from pharmacy;Prohibited food (jello, ice cream, thin soups);Clarify dietary restrictions   Follow up Recommendations  Skilled Nursing facility    Frequency and Duration min 2x/week  1 week       Swallow Study   General Date of Onset: 11/06/15 HPI: 74 year old female patient with extensive past medical history - of left parotidectomy for  malignant tumor, aspiration pneumonia s/p VDRF, chronic diastolic CHF, DM 2, dysphagia status post PEG tube, possible seizure disorder on Keppra, HLD, 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 She was admitted to stepdown unit for aspiration pneumonia, decompensated diastolic CHF and acute respiratory failure with hypoxia. Improved. Transferred to telemetry on 11/21. Reports of pt being found supine in bed at SNF on Saturday with worry for aspiration event (tube feeds and emesis). SLP asked to evaluate pt at bedside due to history of dysphagia. Type of Study: Bedside Swallow Evaluation Previous Swallow Assessment: MBSS Oct 2016 trials puree and HTL with SLP; continue PEG  Diet Prior to this Study: PEG tube;NPO Temperature Spikes Noted: No Respiratory Status: Nasal cannula History of Recent Intubation: No Behavior/Cognition: Alert;Cooperative;Pleasant mood Oral Cavity Assessment: Within Functional Limits;Dry Oral Care Completed by SLP: Yes Oral Cavity - Dentition: Dentures, top;Dentures, bottom Vision: Functional for self-feeding Self-Feeding Abilities: Needs assist Patient Positioning: Upright in bed Baseline Vocal Quality: Normal;Low vocal intensity Volitional Cough: Weak Volitional Swallow: Able to elicit    Oral/Motor/Sensory Function Overall Oral Motor/Sensory Function: Mild impairment Facial ROM: Reduced left;Suspected CN VII (facial) dysfunction Facial Symmetry: Abnormal symmetry  left;Suspected CN VII (facial) dysfunction Facial Strength: Reduced  left Lingual ROM: Reduced left Lingual Symmetry: Within Functional Limits Lingual Strength: Reduced Lingual Sensation: Within Functional Limits Velum: Within Functional Limits Mandible: Within Functional Limits   Ice Chips Ice chips: Impaired Presentation: Spoon Oral Phase Impairments: Reduced lingual movement/coordination Pharyngeal Phase Impairments: Suspected delayed Swallow   Thin Liquid Thin Liquid: Not tested    Nectar Thick Nectar Thick Liquid: Not tested   Honey Thick Honey Thick Liquid: Impaired Presentation: Cup;Self fed;Spoon Oral Phase Impairments: Reduced lingual movement/coordination Oral Phase Functional Implications: Left anterior spillage;Prolonged oral transit;Oral holding Pharyngeal Phase Impairments: Suspected delayed Swallow   Puree Puree: Impaired Presentation: Spoon Oral Phase Impairments: Reduced lingual movement/coordination Oral Phase Functional Implications: Oral holding (Pt reports dislike of applesauce) Pharyngeal Phase Impairments: Suspected delayed Swallow   Solid Solid: Not tested      Thank you,  Genene Churn, [place]  PORTER,DABNEY 11/07/2015,1:35 PM

## 2015-11-07 NOTE — Consult Note (Signed)
WOC contacted bedside nurse to perform [facility] consultation.  We discussed the area of concern, her buttocks and sacral area.  Bedside nurse reports skin is not open but denuded from incontinence; MASD (moisture associated skin damage).   She has an indwelling foley now to manage incontinence.  Staff have implemented use of barrier cream and prophylactic sacral dressing.  No other topical care recommended at this time.  Continue to monitor skin for any other changes.    Re consult if needed, will not follow at this time. Thanks  Zofia Peckinpaugh [place], [place] [phone])

## 2015-11-07 NOTE — Clinical Social Work Note (Signed)
Clinical Social Work Assessment  Patient Details  Name: Laura Melendez MRN: [id number] Date of Birth: May 26, 1941  Date of referral:  11/07/15               Reason for consult:  Facility Placement                Permission sought to share information with:    Permission granted to share information::     Name::        Agency::     Relationship::     Contact Information:     Housing/Transportation Living arrangements for the past 2 months:  [place] of Information:  Patient, Spouse, Other (Comment Required) (Sister, Laura Melendez) Patient Interpreter Needed:  None Criminal Activity/Legal Involvement Pertinent to Current Situation/Hospitalization:  No - Comment as needed Significant Relationships:  Spouse, Siblings, Adult Children Lives with:  Facility Resident Do you feel safe going back to the place where you live?  Yes Need for family participation in patient care:  Yes (Comment)  Care giving concerns:  Facility resident.   Social Worker assessment / plan:   CSW spoke with patient, her husband and sister, Laura Melendez who were at  Bedside. Patient was sitting in a chair. They advised that patient has been in [facility] for about four weeks but has had multiple hospitalizations during this time which has interfered with her rehab. They stated that patient is bed bound basically and has only been up to a standing position once since being in the rehab facility. Laura Melendez advised that at baseline before patient's stroke she ambulated independently and completed ADLs independently. Patient and husband advised that typically patient ambulates with her husband assisting her but will use a wheelchair for long distances only. Family reported that they visit with patient often. They all agreed for patient to return to facility upon discharge.  CSW left a message with Tammi at [facility] requresting return contact.   Employment status:  Retired Nurse, adult PT  Recommendations:  [place] / Referral to community resources:  [place]  Patient/Family's Response to care: Patient and Family are agreeable for patient to go to return to [facility] at discharge.  Patient/Family's Understanding of and Emotional Response to Diagnosis, Current Treatment, and Prognosis:  Patient and family verbalize understanding of patient's diagnosis, treatment and prognosis.   Emotional Assessment Appearance:  Appears stated age Attitude/Demeanor/Rapport:   (Cooperative, pleasant.) Affect (typically observed):  Accepting, Calm Orientation:  Oriented to Self, Oriented to Place, Oriented to  Time, Oriented to Situation Alcohol / Substance use:  Not Applicable Psych involvement (Current and /or in the community):  No (Comment)  Discharge Needs  Concerns to be addressed:  Discharge Planning Concerns Readmission within the last 30 days:  Yes Current discharge risk:  Chronically ill Barriers to Discharge:  No Barriers Identified   Ihor Gully, LCSW 11/07/2015, 4:08 PM [phone]

## 2015-11-07 NOTE — Care Management Note (Signed)
Case Management Note  Patient Details  Name: GENIE MOORHOUSE MRN: [id number] Date of Birth: [birth year]  Subjective/Objective:                  Pt admitted with respirator failure. Pt is from [facility] SNF.  Action/Plan: Anticipate return to SNF at discharge. CSW is aware and will arrange for return to facility when appropriate. No CM needs anticipated.   Expected Discharge Date:  11/09/15               Expected Discharge Plan:  Skilled Nursing Facility  In-House Referral:  Clinical Social Work  Discharge planning Services  CM Consult  Post Acute Care Choice:  NA Choice offered to:  NA  DME Arranged:    DME Agency:     HH Arranged:    [place] Agency:     Status of Service:  Completed, signed off  Medicare Important Message Given:    Date Medicare IM Given:    Medicare IM give by:    Date Additional Medicare IM Given:    Additional Medicare Important Message give by:     If discussed at [place] of Stay Meetings, dates discussed:    Additional Comments:  Sherald Barge, RN 11/07/2015, 1:28 PM

## 2015-11-07 NOTE — Progress Notes (Signed)
Nutrition Follow-Up  INTERVENTION:  Vital 1.2 ml/hr @ goal rate 40 ml/hr. Provides 1152 kcal, 72 gr protein and 768 ml water. Meets 95% energy and 100% protein needs.  Free water -200 ml TID and 30 ml water before and after medications.     NUTRITION DIAGNOSIS:  Predicted suboptimal oral nutrient intake related to swallow difficulty: ongoing. Pt PEG providing 100% of nutriton.  GOAL: Provide nutrition needs based on ASPEN/SCCM guidelines; met    MONITOR: TF tolerance, Labs, Weight trends, Skin   REASON FOR ASSESSMENT: Enteral feeding/NPO    ASSESSMENT: Pt has returned with aspiration pneumonia. Recent acute CHF as well. Her weight has continued to decrease 2.2 kg (desireable). Her tube feeding is at goal rate and she is tolerating well per nursing. Plans are to move her out of ICU later today and ST follow up is pending.  11/21-Chest x-ray-no acute changes. She is afebrile.  Abnormal Labs: BUN-31, glucose 144 mg/dl.  Hemoglobin/Hematocrit- trending down- 7.5/25.1 today. PT, INR 30.5 and 2.99 respectively.  Diet Order:  Diet NPO time specified  Skin:   surgical wound to neck- healing  Last BM:  11/16    Height:   Ht Readings from Last 1 Encounters:  11/06/15 '5\' 6"'  (1.676 m)    Weight:   Wt Readings from Last 1 Encounters:  11/07/15 93 lb 0.6 oz (42.2 kg)    Ideal Body Weight:   59 kg  BMI:  Body mass index is 15.02 kg/(m^2). underweight  Re-Estimated Nutritional Needs:   Kcal:  1176  Protein:   70 gr  Fluid:   1.3 liters daily  EDUCATION NEEDS: none at this time      Colman Cater MS,RD,CSG,LDN Office: #[phone] Pager: [phone]

## 2015-11-07 NOTE — Progress Notes (Signed)
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 fib on Coumadin, osteoporosis, pneumothorax status post chest tube, HTN, chronic indwelling Foley catheter for the last 2 months secondary to urinary retention, multiple hospitalizations (4 in the last 2 months), recent admission to [facility] 10/29/15-11/02/15 for acute hypoxic respiratory failure due to acute on chronic diastolic CHF, admitted to [facility] on 11/06/15 with new onset dyspnea, hypoxia and suspected aspiration. She was admitted to stepdown unit for aspiration pneumonia, decompensated diastolic CHF and acute respiratory failure with hypoxia. Improved. Transferred to telemetry on 11/21.   Assessment/Plan:  Aspiration pneumonia, tube feeds/gastric contents, recurrent - As per family, patient apparently found supine in bed at SNF which would contribute to aspiration. Patient to be nursed head up while on tube feeds. - Treating empirically with IV vancomycin and Zosyn - Check Procalcitonin - Improving. No fevers. Leukocytosis resolved. Repeat chest x-ray. Continue IV antibiotics for additional 24 hours.  Acute on chronic diastolic CHF - Changed Lasix to 40 mg IV every 12 hourly. - BNP on admission: 3854 - Improved. Follow-up chest x-ray. Intake output probably not accurately documented.  Acute respiratory failure with hypoxia - Secondary to aspiration pneumonia and decompensated CHF. - Presented with oxygen saturations in the mid 80s. Transiently placed on BiPAP. - Improved. Transitioned to oxygen via nasal cannula. Wean off oxygen as tolerated. - Oxygen saturation  at 100% on 2 L/m we'll off oxygen as long as she went in saturations greater than 92%.  Essential hypertension - Controlled. Resumed home dose of metoprolol.  Type II DM - Sliding-scale insulin. Controlled.  Chronic atrial fibrillation - Continue Coumadin per pharmacy and metoprolol. INR 11/21:2.99.  Dysphagia status post PEG tube - Resumed tube feeds. Dietitian consultation for tube feed management. Speech therapy consultation to evaluate swallowing.  Urinary retention - Has had chronic indwelling Foley catheter for the last 2 months. Last changed on 11/19 at SNF. Outpatient urology consultation.  CVA with residual right hemiparesis - Continue Coumadin anticoagulation. PT and OT evaluation  Possible seizure disorder - Continue Keppra  Chronic anemia - Hemoglobin has dropped from 8.5 on 11/20 to 7.5 on 11/21. Hemoglobin has been fluctuating in the 7. 5-8 range over the last several days. No reported bleeding. Follow CBC in a.m. Transfuse PRBCs if hemoglobin less than 7 g per DL.  Left parotidectomy for malignant tumor  Thrombocytosis, possibly reactive - Improving.  Prolonged QTC - Repeat EKG: QTC 5:15 milliseconds. Continue monitoring on telemetry. Potassium 4.4. Check magnesium: 2.2. Minimize QT prolonging medications.  Recently treated Escherichia coli UTI   DVT prophylaxis: Anticoagulated on Coumadin Code Status: Full  Family Communication: Discussed with son at bedside on 11/20. None at bedside. Disposition Plan: DC back to SNF when medically stable, possibly in 2-3 days.    Consultants:  None  Procedures:  Indwelling Foley catheter-present since PTA.  BiPAP on admission-discontinued  Antibiotics:  IV vancomycin 11/20 >  IV Zosyn 11/20 >   Subjective: Denies complaints. Denies dyspnea or chest pain. As per RN, no acute events. No bleeding reported.  Objective: Filed Vitals:   11/07/15 0300 11/07/15 0400 11/07/15 0500 11/07/15 0600  BP: 114/53  123/55 127/67 126/57  Pulse: 70 75 76 80  Temp:  97.9 F (36.6 C)    TempSrc:  Axillary    Resp: 19 21 18 20   Height:      Weight:  42.2 kg (93 lb 0.6 oz)    SpO2: 100% 99% 100% 100%    Intake/Output Summary (Last 24 hours) at 11/07/15 0759 Last data filed at 11/07/15 0543  Gross per 24 hour  Intake 2184.67 ml  Output   1551 ml  Net 633.67 ml   Filed Weights   11/06/15 0505 11/06/15 0805 11/07/15 0400  Weight: 42.893 kg (94 lb 9 oz) 45 kg (99 lb 3.3 oz) 42.2 kg (93 lb 0.6 oz)     Exam:  General exam: Moderately built and thinly nourished pleasant elderly female patient, lying comfortably popped up in bed without distress. Respiratory system: Clear anteriorly. Slightly diminished breath sounds in the bases. Rest of lung fields clear to auscultation. No increased work of breathing. Cardiovascular system: S1 & S2 heard, RRR. No JVD, murmurs, gallops, clicks or pedal edema. Telemetry: Sinus rhythm Gastrointestinal system: Abdomen is nondistended, soft and nontender. Normal bowel sounds heard. Central nervous system: Alert and oriented 2. No focal neurological deficits. Extremities: Symmetric 5 x 5 power.   Data Reviewed: Basic Metabolic Panel:  Recent Labs Lab 11/01/15 0427 11/02/15 0614 11/04/15 0745 11/06/15 0503 11/07/15 0416  NA 140 142 139 141 139  K 3.8 3.6 4.2 4.4 4.0  CL 107 107 104 105 103  CO2 28 28 29 26 28   GLUCOSE 143* 138* 150* 262* 144*  BUN 35* 26* 31* 28* 31*  CREATININE 0.49 0.35* 0.51 0.54 0.66  CALCIUM 7.9* 8.4* 8.3* 8.8* 8.3*  MG  --   --   --  2.2  --    Liver Function Tests:  Recent Labs Lab 11/04/15 0745  AST 17  ALT 17  ALKPHOS 42  BILITOT 0.3  PROT 5.1*  ALBUMIN 2.0*   No results for input(s): LIPASE, AMYLASE in the last 168 hours. No results for input(s): AMMONIA in the last 168 hours. CBC:  Recent Labs Lab 11/01/15 0427 11/02/15 0614 11/04/15 0745 11/06/15 0503 11/07/15 0416  WBC 7.7 8.4 8.6 11.5* 9.2  NEUTROABS   --   --   --  7.1  --   HGB 7.6* 7.7* 8.0* 8.5* 7.5*  HCT 24.5* 25.4* 27.0* 27.9* 25.1*  MCV 83.1 84.7 85.4 83.3 83.9  PLT 523* 583* 593* 689* 572*   Cardiac Enzymes:  Recent Labs Lab 11/06/15 0503  TROPONINI 0.03   BNP (last 3 results) No results for input(s): PROBNP in the last 8760 hours. CBG:  Recent Labs Lab 11/02/15 1635 11/06/15 1615 11/06/15 1942 11/07/15 11/07/15 0507  GLUCAP 135* 89 119* 133* 135*    Recent Results (from the past 240 hour(s))  Blood culture (routine x 2)     Status: None   Collection Time: 10/29/15  8:40 PM  Result Value Ref Range Status   Specimen Description BLOOD RIGHT ANTECUBITAL  Final   Special Requests   Final    BOTTLES DRAWN AEROBIC AND ANAEROBIC AEB=4CC ANA=3CC   Culture NO GROWTH 5 DAYS  Final   Report Status 11/03/2015 FINAL  Final  Blood culture (routine x 2)     Status: None   Collection Time: 10/29/15  8:43 PM  Result Value Ref Range Status   Specimen Description BLOOD LEFT ANTECUBITAL  Final   Special Requests BOTTLES DRAWN AEROBIC AND ANAEROBIC [facility] EACH  Final  Culture NO GROWTH 5 DAYS  Final   Report Status 11/03/2015 FINAL  Final  Culture, Urine     Status: None   Collection Time: 10/31/15 10:33 AM  Result Value Ref Range Status   Specimen Description URINE, CATHETERIZED  Final   Special Requests NONE  Final   Culture   Final    >=100,000 COLONIES/mL ESCHERICHIA COLI Performed at [facility]    Report Status 11/02/2015 FINAL  Final   Organism ID, Bacteria ESCHERICHIA COLI  Final      Susceptibility   Escherichia coli - MIC*    AMPICILLIN <=2 SENSITIVE Sensitive     CEFAZOLIN <=4 SENSITIVE Sensitive     CEFTRIAXONE <=1 SENSITIVE Sensitive     CIPROFLOXACIN <=0.25 SENSITIVE Sensitive     GENTAMICIN <=1 SENSITIVE Sensitive     IMIPENEM <=0.25 SENSITIVE Sensitive     NITROFURANTOIN <=16 SENSITIVE Sensitive     TRIMETH/SULFA <=20 SENSITIVE Sensitive     AMPICILLIN/SULBACTAM <=2 SENSITIVE Sensitive      PIP/TAZO <=4 SENSITIVE Sensitive     * >=100,000 COLONIES/mL ESCHERICHIA COLI  Culture, blood (routine x 2)     Status: None (Preliminary result)   Collection Time: 11/06/15  6:56 AM  Result Value Ref Range Status   Specimen Description BLOOD  Final   Special Requests NONE  Final   Culture NO GROWTH <12 HOURS  Final   Report Status PENDING  Incomplete  Culture, blood (routine x 2)     Status: None (Preliminary result)   Collection Time: 11/06/15  7:04 AM  Result Value Ref Range Status   Specimen Description BLOOD  Final   Special Requests NONE  Final   Culture NO GROWTH <12 HOURS  Final   Report Status PENDING  Incomplete         Studies: Dg Chest Portable 1 View  11/06/2015  CLINICAL DATA:  Shortness of breath. EXAM: PORTABLE CHEST 1 VIEW COMPARISON:  10/29/2015 FINDINGS: Worsening bibasilar opacities from prior with developing haziness suggestive of pleural effusions. Vascular congestion is unchanged. Cardiomediastinal contours with mild cardiomegaly are unchanged. No pneumothorax. The bones are under mineralized IMPRESSION: Worsening bibasilar opacities with developing lower lobe haziness suggestive of pleural effusions. Aspiration, pneumonia, or pleural effusions with compressive atelectasis. Electronically Signed   By: Jeb Levering M.D.   On: 11/06/2015 05:32        Scheduled Meds: . antiseptic oral rinse  7 mL Mouth Rinse q12n4p  . aspirin  81 mg Per Tube Daily  . chlorhexidine  15 mL Mouth Rinse BID  . feeding supplement (VITAL AF 1.2 CAL)  1,000 mL Per Tube Q24H  . FLUoxetine  10 mg Per Tube Daily  . free water  200 mL Per Tube 3 times per day  . furosemide  40 mg Intravenous BID  . guaiFENesin  1,200 mg Oral BID  . insulin aspart  0-9 Units Subcutaneous 6 times per day  . levETIRAcetam  1,500 mg Per Tube QHS  . levETIRAcetam  750 mg Per Tube Daily  . methylphenidate  5 mg Per Tube BID WC  . metoprolol tartrate  25 mg Per Tube BID  . piperacillin-tazobactam  (ZOSYN)  IV  3.375 g Intravenous 3 times per day  . pravastatin  40 mg Per Tube QPM  . sodium chloride  3 mL Intravenous Q12H  . vancomycin  1,000 mg Intravenous Q24H  . 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 [facility] Pager [phone]  If 7PM-7AM, please contact night-coverage [email or web address] Password [email or web address] 11/07/2015, 7:59 AM    LOS: 1 day

## 2015-11-07 NOTE — Evaluation (Signed)
Occupational Therapy Evaluation Patient Details Name: Laura Melendez MRN: [id number] DOB: 1941/10/20 Today's Date: 11/07/2015    History of Present Illness Laura Melendez is a 74 y.o. female, recent hospitalization at [facility] 10/29/15-11/02/15 for acute hypoxic respiratory failure due to acute on chronic diastolic CHF, presented to [facility] ED from SNF on 11/06/15 with new onset of acute dyspnea, suspected aspiration of gastric contents and hypoxia in the mid 80s. As per review of records and discussion with son at bedside, patient has had a complicated and [place] course over the last 2 months. She underwent elective left parotidectomy for malignant tumor at [facility] approximately 2 months ago. She then developed wound hematoma requiring repeat surgery. This was followed by gram-negative pneumonia and VDRF. She had dysphagia and PEG tube was placed. Discharge to [facility] for rehabilitation.   Clinical Impression   Patient limited with functional performance during OT evaluation due to fatigue. Although patient is tolerate sitting on EOB for 2-3 minutes with Total Assist. Patient will benefit from skilled OT services to increase functional performance and strength needed during ADL tasks. Recommend D/C back to [facility] when patient is able.     Follow Up Recommendations  SNF    Equipment Recommendations  None recommended by OT       Precautions / Restrictions Precautions Precautions: Fall Precaution Comments: HOB at 28* whenever receiving PEG tube feeding Restrictions Weight Bearing Restrictions: No      Mobility Bed Mobility Overal bed mobility: Needs Assistance Bed Mobility: Rolling;Supine to Sit Rolling: Total assist;+2 for safety/equipment   Supine to sit: Total assist     General bed mobility comments: Pt tolerated sitting on EOB with Total Assist for 2-3 minutes.   Transfers                 General transfer comment: Transfer  not assessed due to wires and tubing.          ADL Overall ADL's : Needs assistance/impaired                 Upper Body Dressing : Total assistance;Bed level                     General ADL Comments: Total assist for toilet hygiene.                Pertinent Vitals/Pain Pain Assessment: No/denies pain     Hand Dominance Right   Extremity/Trunk Assessment Upper Extremity Assessment Upper Extremity Assessment: Generalized weakness   Lower Extremity Assessment Lower Extremity Assessment: Defer to PT evaluation       Communication Communication Communication: No difficulties   Cognition Arousal/Alertness: Awake/alert Behavior During Therapy: Flat affect Overall Cognitive Status: Within Functional Limits for tasks assessed                                Home Living Family/patient expects to be discharged to:: Skilled nursing facility                                        Prior Functioning/Environment Level of Independence: Independent        Comments: Independent prior to recent hospitalization    OT Diagnosis: Generalized weakness   OT Problem List: Decreased strength;Decreased activity tolerance;Impaired balance (sitting and/or standing)   OT  Treatment/Interventions: Self-care/ADL training;Therapeutic activities;Therapeutic exercise;Energy conservation;Balance training       OT Frequency: Min 2X/week    End of Session Nurse Communication: Mobility status  Activity Tolerance: Patient limited by fatigue Patient left: in bed;with call bell/phone within reach   Time: 0837-0920 OT Time Calculation (min): 43 min Charges:  OT General Charges $OT Visit: 1 Procedure OT Evaluation $Initial OT Evaluation Tier I: 1 Procedure OT Treatments $Self Care/Home Management : 23-37 mins G-Codes:    Ailene Ravel, OTR/L,CBIS  [phone]  11/07/2015, 10:16 AM

## 2015-11-07 NOTE — Plan of Care (Signed)
Dr. Kandice Robinsons with stat cxr and ekg 12 lead results concluded. Also weaned pt O2 to RA (94%).  No further orders at this time.

## 2015-11-07 NOTE — Progress Notes (Signed)
ANTICOAGULATION CONSULT NOTE   Pharmacy Consult forCoumadin Indication: atrial fibrillation  No Known Allergies  Patient Measurements: Height: 5\' 6"  (167.6 cm) Weight: 93 lb 0.6 oz (42.2 kg) IBW/kg (Calculated) : 59.3kg  Vital Signs: Temp: 97.9 F (36.6 C) (11/21 0400) Temp Source: Axillary (11/21 0400) BP: 128/60 mmHg (11/21 1027) Pulse Rate: 86 (11/21 1027)  Labs:  Recent Labs  11/05/15 0450 11/06/15 0503 11/06/15 0508 11/07/15 0416  HGB  --  8.5*  --  7.5*  HCT  --  27.9*  --  25.1*  PLT  --  689*  --  572*  LABPROT 18.1*  --  22.1* 30.5*  INR 1.49  --  1.95* 2.99*  CREATININE  --  0.54  --  0.66  TROPONINI  --  0.03  --   --     Estimated Creatinine Clearance: 41.1 mL/min (by C-G formula based on Cr of 0.66).   Medical History: Past Medical History  Diagnosis Date  . Essential hypertension, benign   . Type 2 diabetes mellitus ([place])   . Osteoporosis 06/2008  . Stroke [facility]) 03/2015    right hemiparesis  . History of parotid cancer     Medications:  See med rec  Assessment: Pt cont on empiric tx PNA with Vancomycin and Zosyn.Afeb, WBC decreased.   Chronic afib on coumadin. INR increased significantly overnight. Hg stable.  No bleeding reported  Goal of Therapy:  INR 2-3 Monitor platelets by anticoagulation protocol: Yes   Plan:  Hold coumadin today Cont vanc and zosyn Daily PT/INR Monitor for S/S of bleeding  Thanks for allowing pharmacy to be a part of this patient's care.  Excell Seltzer, PharmD Clinical Pharmacist 11/07/2015,12:58 PM

## 2015-11-07 NOTE — Evaluation (Signed)
Physical Therapy Evaluation Patient Details Name: Laura Melendez MRN: [id number] DOB: 1941/07/23 Today's Date: 11/07/2015   History of Present Illness  HPI: Laura Melendez is a 74 y.o. female, recent hospitalization at [facility] 10/29/15-11/02/15 for acute hypoxic respiratory failure due to acute on chronic diastolic CHF, presented to [facility] ED from SNF on 11/06/15 with new onset of acute dyspnea, suspected aspiration of gastric contents and hypoxia in the mid 80s. As per review of records and discussion with son at bedside, patient has had a complicated and [place] course over the last 2 months. She underwent elective left parotidectomy for malignant tumor at [facility] approximately 2 months ago. She then developed wound hematoma requiring repeat surgery. This was followed by gram-negative pneumonia and VDRF. She had dysphagia and PEG tube was placed. Discharge to [facility] for rehabilitation. Rehospitalized at [facility] 10/31-11/4 for aspiration pneumonia. Discharged back to [facility] for rehabilitation. Rehospitalized to [facility] as stated above. Since her discharge back to rehabilitation, patient apparently did very well. She was not on home oxygen. She had periodic sleepiness and M.D. at SNF some medication adjustments were made following which she made further improvement. As per son, she was up in the chair couple days ago which was the first time she has done so in the last 2 months. She was also able to eat toast and Jell-O. Early 11/20 a.m., patient apparently was found lying supine in bed with difficulty breathing and suspected aspiration. She arrived to the ED with CPAP by EMS and oxygen saturations in the mid 80s at [facility]. Chest x-ray revealed worsening bibasilar opacities. She was admitted to stepdown unit for concern of aspiration pneumonia and acute on diastolic CHF leading to acute respiratory failure with hypoxia. 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  Per husband she had just been able to sit up at [facility].  She continues to have significant weakness throughout, needs max to total assist for bed mobility and transfers and has zero-poor sitting balance.  Today she was able to tolerate resumption of supine strengthening exercise and then transferred to a chair with total assist of therapist.  She is currently sitting fully upright in a chair.  She would benefit from SNF at d/c.    Follow Up Recommendations SNF    Equipment Recommendations  None recommended by PT    Recommendations for Other Services OT consult     Precautions / Restrictions Precautions Precautions: Fall Precaution Comments: HOB at 16* whenever receiving PEG tube feeding Restrictions Weight Bearing Restrictions: No      Mobility  Bed Mobility Overal bed mobility: Needs Assistance       Supine to sit: Total assist     General bed mobility comments: falls to the right due to poor sitting balance...facilitation of sitting done with use of both UEs  Transfers Overall transfer level: Needs assistance Equipment used: None Transfers: Sit to/from Stand Sit to Stand: Total assist         General transfer comment: unable to functionally weight bear on either leg  Ambulation/Gait  General Gait Details: unable  Stairs            Wheelchair Mobility    Modified Rankin (Stroke Patients Only)       Balance Overall balance assessment: Needs assistance Sitting-balance support: No upper extremity  supported;Feet supported Sitting balance-Leahy Scale: Zero Sitting balance - Comments: sitting balance is poor with bilateral UE assist                                     Pertinent Vitals/Pain Pain Assessment: No/denies pain    Home Living Family/patient expects to be discharged to:: Skilled nursing facility                      Prior Function Level of Independence: Needs assistance   Gait / Transfers Assistance Needed: pt has not ambulated since her parotid surgery several months ago,  ADL's / Homemaking Assistance Needed: independent prior to parotid surgery        Hand Dominance   Dominant Hand: Right    Extremity/Trunk Assessment   Upper Extremity Assessment: Defer to OT evaluation             RLE Deficits / Details: strength generally 3-/5 LLE Deficits / Details: strength generally 3-/5  Cervical / Trunk Assessment: Kyphotic  Communication   Communication: Expressive difficulties  Cognition Arousal/Alertness: Awake/alert Behavior During Therapy: Flat affect Overall Cognitive Status: Within Functional Limits for tasks assessed                      General Comments      Exercises General Exercises - Lower Extremity Ankle Circles/Pumps: AAROM;Both;10 reps;Supine Short Arc Quad: AAROM;Both;10 reps;Supine Long Arc Quad: AAROM;Both;Supine;5 reps Heel Slides: AAROM;Both;10 reps;Supine Hip ABduction/ADduction: AAROM;Both;10 reps;Supine      Assessment/Plan    PT Assessment Patient needs continued PT services  PT Diagnosis Generalized weakness   PT Problem List Decreased strength;Decreased activity tolerance;Decreased mobility;Decreased balance  PT Treatment Interventions Therapeutic exercise;Balance training;Functional mobility training   PT Goals (Current goals can be found in the Care Plan section) Acute Rehab PT Goals Patient Stated Goal: none stated PT Goal Formulation: With patient/family Time For Goal  Achievement: 11/21/15 Potential to Achieve Goals: Fair    Frequency Min 3X/week   Barriers to discharge   complexity of medical issues prevents return home    Co-evaluation               End of Session Equipment Utilized During Treatment: Gait belt Activity Tolerance: Patient tolerated treatment well Patient left: in chair;with call bell/phone within reach;with family/visitor present;with nursing/sitter in room Nurse Communication: Mobility status         Time: [id number] PT Time Calculation (min) (ACUTE ONLY): 44 min   Charges:   PT Evaluation $Initial PT Evaluation Tier I: 1 Procedure PT Treatments $Therapeutic Exercise: 8-22 mins   PT G CodesSable Feil  PT 11/07/2015, 3:02 PM [phone]

## 2015-11-07 NOTE — Plan of Care (Signed)
Pt tolerating tube feeding at max goal rate (0 residual noted on assessment.)  VSS. Pt transferring to Floor 300 - Report called to Rosealee Albee, RN. Going to room 338 - family informed.

## 2015-11-08 ENCOUNTER — Inpatient Hospital Stay (HOSPITAL_COMMUNITY): Payer: Medicare HMO

## 2015-11-08 DIAGNOSIS — R627 Adult failure to thrive: Secondary | ICD-10-CM

## 2015-11-08 DIAGNOSIS — Z515 Encounter for palliative care: Secondary | ICD-10-CM

## 2015-11-08 DIAGNOSIS — I5033 Acute on chronic diastolic (congestive) heart failure: Secondary | ICD-10-CM

## 2015-11-08 DIAGNOSIS — E876 Hypokalemia: Secondary | ICD-10-CM

## 2015-11-08 DIAGNOSIS — E440 Moderate protein-calorie malnutrition: Secondary | ICD-10-CM

## 2015-11-08 LAB — GLUCOSE, CAPILLARY
GLUCOSE-CAPILLARY: 116 mg/dL — AB (ref 65–99)
GLUCOSE-CAPILLARY: 117 mg/dL — AB (ref 65–99)
GLUCOSE-CAPILLARY: 136 mg/dL — AB (ref 65–99)
GLUCOSE-CAPILLARY: 149 mg/dL — AB (ref 65–99)
GLUCOSE-CAPILLARY: 163 mg/dL — AB (ref 65–99)
Glucose-Capillary: 111 mg/dL — ABNORMAL HIGH (ref 65–99)
Glucose-Capillary: 176 mg/dL — ABNORMAL HIGH (ref 65–99)

## 2015-11-08 LAB — TROPONIN I
Troponin I: 0.04 ng/mL — ABNORMAL HIGH (ref <0.031)
Troponin I: 0.03 ng/mL (ref <0.031)

## 2015-11-08 LAB — BASIC METABOLIC PANEL
Anion gap: 7 (ref 5–15)
BUN: 24 mg/dL — AB (ref 6–20)
CALCIUM: 8.1 mg/dL — AB (ref 8.9–10.3)
CO2: 29 mmol/L (ref 22–32)
CREATININE: 0.61 mg/dL (ref 0.44–1.00)
Chloride: 103 mmol/L (ref 101–111)
GFR calc Af Amer: >60 mL/min (ref >60)
Glucose, Bld: 186 mg/dL — ABNORMAL HIGH (ref 65–99)
Potassium: 3.1 mmol/L — ABNORMAL LOW (ref 3.5–5.1)
SODIUM: 139 mmol/L (ref 135–145)

## 2015-11-08 LAB — URINE CULTURE

## 2015-11-08 LAB — MAGNESIUM: Magnesium: 2.2 mg/dL (ref 1.7–2.4)

## 2015-11-08 LAB — CBC
HCT: 24.5 % — ABNORMAL LOW (ref 36.0–46.0)
Hemoglobin: 7.7 g/dL — ABNORMAL LOW (ref 12.0–15.0)
MCH: 25.7 pg — AB (ref 26.0–34.0)
MCHC: 31.4 g/dL (ref 30.0–36.0)
MCV: 81.7 fL (ref 78.0–100.0)
PLATELETS: 554 10*3/uL — AB (ref 150–400)
RBC: 3 MIL/uL — ABNORMAL LOW (ref 3.87–5.11)
RDW: 16.7 % — ABNORMAL HIGH (ref 11.5–15.5)
WBC: 9 10*3/uL (ref 4.0–10.5)

## 2015-11-08 LAB — BLOOD GAS, ARTERIAL
ACID-BASE EXCESS: 4 mmol/L — AB (ref 0.0–2.0)
BICARBONATE: 28.2 meq/L — AB (ref 20.0–24.0)
Delivery systems: POSITIVE
Drawn by: 234301
EXPIRATORY PAP: 8
FIO2: 40
INSPIRATORY PAP: 16
Mode: POSITIVE
O2 SAT: 98.6 %
PCO2 ART: 38.4 mmHg (ref 35.0–45.0)
PH ART: 7.471 — AB (ref 7.350–7.450)
PO2 ART: 127 mmHg — AB (ref 80.0–100.0)

## 2015-11-08 LAB — PROTIME-INR
INR: 3.38 — AB (ref 0.00–1.49)
PROTHROMBIN TIME: 33.5 s — AB (ref 11.6–15.2)

## 2015-11-08 MED ORDER — POTASSIUM CHLORIDE 20 MEQ/15ML (10%) PO SOLN
40.0000 meq | Freq: Once | ORAL | Status: AC
  Administered 2015-11-08: 40 meq
  Filled 2015-11-08: qty 30

## 2015-11-08 MED ORDER — POTASSIUM CHLORIDE 20 MEQ/15ML (10%) PO SOLN
40.0000 meq | ORAL | Status: AC
  Administered 2015-11-08 (×2): 40 meq
  Filled 2015-11-08: qty 30

## 2015-11-08 MED ORDER — IPRATROPIUM-ALBUTEROL 0.5-2.5 (3) MG/3ML IN SOLN
3.0000 mL | Freq: Four times a day (QID) | RESPIRATORY_TRACT | Status: DC
  Administered 2015-11-08 – 2015-11-09 (×5): 3 mL via RESPIRATORY_TRACT
  Filled 2015-11-08 (×4): qty 3

## 2015-11-08 MED ORDER — METHYLPREDNISOLONE SODIUM SUCC 40 MG IJ SOLR
40.0000 mg | Freq: Two times a day (BID) | INTRAMUSCULAR | Status: DC
  Administered 2015-11-08 – 2015-11-09 (×2): 40 mg via INTRAVENOUS
  Filled 2015-11-08 (×2): qty 1

## 2015-11-08 MED ORDER — FUROSEMIDE 10 MG/ML IJ SOLN: 60.0000 mg | Freq: Two times a day (BID) | INTRAMUSCULAR | Status: DC

## 2015-11-08 MED ORDER — FUROSEMIDE 10 MG/ML IJ SOLN
60.0000 mg | Freq: Once | INTRAMUSCULAR | Status: AC
  Administered 2015-11-08: 60 mg via INTRAVENOUS
  Filled 2015-11-08: qty 6

## 2015-11-08 NOTE — Progress Notes (Signed)
Patient's family spoke with Dr. Wolfgang Phoenix who is patient's PCP. They wanted his input on the patient due to his familiarity and knowledge of the patient. After discussion with him family wants to make patient limited code. She is already DNI. Now they want no cpr, no shock, as well as DNI. Orders obtained from Dr. Darrick Meigs

## 2015-11-08 NOTE — Clinical Documentation Improvement (Signed)
Hospitalist and/or Pulmonary  (query responses must be documented in the progress notes and discharge summary.  Responses documented on BPAs are not codeable because the BPA is not part of the permanent medical record.)  "Chronic anemia" is documented in the current hospital medical record.  For greater specificity, please document the cause(s) and/or any associated condition(s) impacting the diagnosis of "Chronic Anemia".  Clinical information: CBC trend this admission Component     Latest Ref Rng 11/06/2015 11/07/2015 11/08/2015  WBC     4.0 - 10.5 K/uL 11.5 (H) 9.2 9.0  RBC     3.87 - 5.11 MIL/uL 3.35 (L) 2.99 (L) 3.00 (L)  Hemoglobin     12.0 - 15.0 g/dL 8.5 (L) 7.5 (L) 7.7 (L)  HCT     36.0 - 46.0 % 27.9 (L) 25.1 (L) 24.5 (L)  MCV     78.0 - 100.0 fL 83.3 83.9 81.7  MCH     26.0 - 34.0 pg 25.4 (L) 25.1 (L) 25.7 (L)  MCHC     30.0 - 36.0 g/dL 30.5 29.9 (L) 31.4  RDW     11.5 - 15.5 % 16.5 (H) 16.7 (H) 16.7 (H)  Platelets     150 - 400 K/uL 689 (H) 572 (H) 554 (H)   Please exercise your independent, professional judgment when responding. A specific answer is not anticipated or expected.   Thank You,  Erling Conte  RN BSN CCDS [phone] [facility] [place]

## 2015-11-08 NOTE — Progress Notes (Signed)
PT Cancellation Note  Patient Details Name: Laura Melendez MRN: [id number] DOB: 09/16/1941   Cancelled Treatment:    Reason Eval/Treat Not Completed: Medical issues which prohibited therapy.  Pt was transferred to the ICU in severe respiratory distress.  We will have to d/c PT orders.  Please reconsult when appropriate to resume.   Demetrios Isaacs L  PT 11/08/2015, 2:36 PM [phone]

## 2015-11-08 NOTE — Progress Notes (Signed)
Addendum  Patient was admitted with acute respiratory failure with hypoxia secondary to aspiration pneumonia and acute on chronic diastolic CHF. She was treated with broad-spectrum IV antibiotics and IV Lasix. She clinically improved and was weaned off oxygen. She was transferred out of stepdown unit to medical floor on 11/21. She does not have much peripheral edema but chest x-ray shows persistent CHF despite IV Lasix. Intake output has not been adequately documented. Patient was doing okay this morning but may be breathing a little fast. However this afternoon, short while ago, paged by RN that patient was having acute dyspnea.  On arrival to bedside, patient to keep make and wheezing. Respiratory rate: 26/m. RS: Reduced breath sounds bilaterally with scattered bilateral few medium pitched expiratory rhonchi and basal crackles. Patient is alert and oriented.  Acute respiratory failure with hypoxia: Patient seems to have decompensated now despite appropriate treatment for aspiration pneumonia and decompensated CHF. Patient just received Lasix 60 mg IV 1 dose that was written earlier today. Therapeutically anticoagulated and hence PE less likely. ? Recurrent aspiration.  Will nebulize now. If she does not turn around in a short time, will need transfer back to stepdown unit. When necessary BiPAP.   Requested Pulmonology & Cardiology consultation.  Vernell Leep, MD, FACP, FHM. [facility] Pager [phone]  If 7PM-7AM, please contact night-coverage [email or web address] Password [email or web address] 11/08/2015, 1:55 PM

## 2015-11-08 NOTE — Consult Note (Signed)
Consult requested by: [facility] Consult requested for respiratory distress:  HPI: This is a 74 year old who has multiple medical problems. She had been in her usual state of poor health when she was admitted for what seemed to be aspiration pneumonia and acute respiratory failure from that. She was treated with BiPAP and improved. She was given antibiotics and Lasix. She was weaned off oxygen and things were going very well but she became suddenly much worse this afternoon. Chest x-ray looks like she may have some pulmonary edema. She has aspirated in the past but there was no overt aspiration at this time.  Past Medical History  Diagnosis Date  . Essential hypertension, benign   . Type 2 diabetes mellitus ([place])   . Osteoporosis 06/2008  . Stroke [facility]) 03/2015    right hemiparesis  . History of parotid cancer      Family History  Problem Relation Age of Onset  . Cancer Father     Liver  . Cancer Sister     Breast  . Diabetes Brother   . Dementia Mother      Social History   Social History  . Marital Status: Married    Spouse Name: N/A  . Number of Children: 3  . Years of Education: 12   Occupational History  . Retired    Social History Main Topics  . Smoking status: Former Smoker -- 0.25 packs/day for 30 years    Types: Cigarettes    Quit date: 02/27/2015  . Smokeless tobacco: None  . Alcohol Use: No  . Drug Use: No  . Sexual Activity: Yes    Birth Control/ Protection: Surgical   Other Topics Concern  . None   Social History Narrative   Lives at home with her husband.   Right-handed.   2 cups caffeine daily.     ROS: Not obtainable because of her status    Objective: Vital signs in last 24 hours: Temp:  [98.3 F (36.8 C)-98.6 F (37 C)] 98.6 F (37 C) (11/22 1404) Pulse Rate:  [79-89] 84 (11/22 1404) Resp:  [18-20] 18 (11/22 1404) BP: (138-149)/(62-68) 148/68 mmHg (11/22 1404) SpO2:  [94 %-100 %] 100 % (11/22 1351) Weight:  [43.1 kg (95  lb 0.3 oz)] 43.1 kg (95 lb 0.3 oz) (11/22 0636) Weight change: -1.9 kg (-4 lb 3 oz) Last BM Date: 11/08/15  Intake/Output from previous day: 11/21 0701 - 11/22 0700 In: 790 [NG/GT:440; IV Piggyback:350] Out: 2077 [Urine:2075; Stool:2]  PHYSICAL EXAM She is awake and in some respiratory distress. She is able to nod or shake her head to answer questions and speak in 1 word sentences. She is on nasal oxygen now. She is sitting upright in bed. Her pupils react. Nose and throat are clear. Her chest shows rales bilaterally fairly diffusely and end-expiratory wheezing. Her heart is regular. Abdomen is soft without masses. Extremities showed trace edema. Central nervous system exam grossly intact  Lab Results: Basic Metabolic Panel:  Recent Labs  11/06/15 0503 11/07/15 0416 11/08/15 0602  NA 141 139 139  K 4.4 4.0 3.1*  CL 105 103 103  CO2 26 28 29   GLUCOSE 262* 144* 186*  BUN 28* 31* 24*  CREATININE 0.54 0.66 0.61  CALCIUM 8.8* 8.3* 8.1*  MG 2.2  --   --    Liver Function Tests: No results for input(s): AST, ALT, ALKPHOS, BILITOT, PROT, ALBUMIN in the last 72 hours. No results for input(s): LIPASE, AMYLASE in the last 72 hours.  No results for input(s): AMMONIA in the last 72 hours. CBC:  Recent Labs  11/06/15 0503 11/07/15 0416 11/08/15 0602  WBC 11.5* 9.2 9.0  NEUTROABS 7.1  --   --   HGB 8.5* 7.5* 7.7*  HCT 27.9* 25.1* 24.5*  MCV 83.3 83.9 81.7  PLT 689* 572* 554*   Cardiac Enzymes:  Recent Labs  11/06/15 0503  TROPONINI 0.03   BNP: No results for input(s): PROBNP in the last 72 hours. D-Dimer: No results for input(s): DDIMER in the last 72 hours. CBG:  Recent Labs  11/07/15 1457 11/07/15 2049 11/08/15 0033 11/08/15 0353 11/08/15 0740 11/08/15 1122  GLUCAP 142* 121* 149* 111* 176* 117*   Hemoglobin A1C: No results for input(s): HGBA1C in the last 72 hours. Fasting Lipid Panel: No results for input(s): CHOL, HDL, LDLCALC, TRIG, CHOLHDL, LDLDIRECT in  the last 72 hours. Thyroid Function Tests: No results for input(s): TSH, T4TOTAL, FREET4, T3FREE, THYROIDAB in the last 72 hours. Anemia Panel: No results for input(s): VITAMINB12, FOLATE, FERRITIN, TIBC, IRON, RETICCTPCT in the last 72 hours. Coagulation:  Recent Labs  11/07/15 0416 11/08/15 0602  LABPROT 30.5* 33.5*  INR 2.99* 3.38*   Urine Drug Screen: Drugs of Abuse     Component Value Date/Time   LABOPIA NONE DETECTED 03/30/2015 1520   COCAINSCRNUR NONE DETECTED 03/30/2015 1520   LABBENZ NONE DETECTED 03/30/2015 1520   AMPHETMU NONE DETECTED 03/30/2015 1520   THCU NONE DETECTED 03/30/2015 1520   LABBARB NONE DETECTED 03/30/2015 1520    Alcohol Level: No results for input(s): ETH in the last 72 hours. Urinalysis:  Recent Labs  11/06/15 0602  COLORURINE YELLOW  LABSPEC 1.025  PHURINE 6.0  GLUCOSEU NEGATIVE  HGBUR LARGE*  BILIRUBINUR NEGATIVE  KETONESUR NEGATIVE  PROTEINUR >300*  NITRITE NEGATIVE  LEUKOCYTESUR TRACE*   Misc. Labs:   ABGS:  Recent Labs  11/06/15 0515  PHART 7.375  PO2ART 118.0*  TCO2 17.2  HCO3 23.8     MICROBIOLOGY: Recent Results (from the past 240 hour(s))  Blood culture (routine x 2)     Status: None   Collection Time: 10/29/15  8:40 PM  Result Value Ref Range Status   Specimen Description BLOOD RIGHT ANTECUBITAL  Final   Special Requests   Final    BOTTLES DRAWN AEROBIC AND ANAEROBIC AEB=4CC ANA=3CC   Culture NO GROWTH 5 DAYS  Final   Report Status 11/03/2015 FINAL  Final  Blood culture (routine x 2)     Status: None   Collection Time: 10/29/15  8:43 PM  Result Value Ref Range Status   Specimen Description BLOOD LEFT ANTECUBITAL  Final   Special Requests BOTTLES DRAWN AEROBIC AND ANAEROBIC 3CC EACH  Final   Culture NO GROWTH 5 DAYS  Final   Report Status 11/03/2015 FINAL  Final  Culture, Urine     Status: None   Collection Time: 10/31/15 10:33 AM  Result Value Ref Range Status   Specimen Description URINE,  CATHETERIZED  Final   Special Requests NONE  Final   Culture   Final    >=100,000 COLONIES/mL ESCHERICHIA COLI Performed at [facility]    Report Status 11/02/2015 FINAL  Final   Organism ID, Bacteria ESCHERICHIA COLI  Final      Susceptibility   Escherichia coli - MIC*    AMPICILLIN <=2 SENSITIVE Sensitive     CEFAZOLIN <=4 SENSITIVE Sensitive     CEFTRIAXONE <=1 SENSITIVE Sensitive     CIPROFLOXACIN <=0.25 SENSITIVE Sensitive  GENTAMICIN <=1 SENSITIVE Sensitive     IMIPENEM <=0.25 SENSITIVE Sensitive     NITROFURANTOIN <=16 SENSITIVE Sensitive     TRIMETH/SULFA <=20 SENSITIVE Sensitive     AMPICILLIN/SULBACTAM <=2 SENSITIVE Sensitive     PIP/TAZO <=4 SENSITIVE Sensitive     * >=100,000 COLONIES/mL ESCHERICHIA COLI  Urine culture     Status: None   Collection Time: 11/06/15  6:02 AM  Result Value Ref Range Status   Specimen Description URINE, CLEAN CATCH  Final   Special Requests NONE  Final   Culture   Final    60,000 COLONIES/ml YEAST Performed at [facility]    Report Status 11/08/2015 FINAL  Final  Culture, blood (routine x 2)     Status: None (Preliminary result)   Collection Time: 11/06/15  6:56 AM  Result Value Ref Range Status   Specimen Description BLOOD RIGHT HAND  Final   Special Requests BOTTLES DRAWN AEROBIC AND ANAEROBIC 4CC EACH  Final   Culture NO GROWTH 2 DAYS  Final   Report Status PENDING  Incomplete  Culture, blood (routine x 2)     Status: None (Preliminary result)   Collection Time: 11/06/15  7:04 AM  Result Value Ref Range Status   Specimen Description BLOOD LEFT HAND  Final   Special Requests BOTTLES DRAWN AEROBIC AND ANAEROBIC [place]  Final   Culture NO GROWTH 2 DAYS  Final   Report Status PENDING  Incomplete    Studies/Results: Dg Chest Port 1 View  11/08/2015  CLINICAL DATA:  Pneumonia, cough EXAM: PORTABLE CHEST 1 VIEW COMPARISON:  11/07/2015 FINDINGS: Cardiomegaly with vascular congestion. Bilateral layering  effusions and lower lobe opacities, likely edema. Findings are similar to prior study. No acute bony abnormality. IMPRESSION: Suspect mild CHF with bilateral lower lobe opacities, likely edema and layering effusions. No real change. Electronically Signed   By: Rolm Baptise M.D.   On: 11/08/2015 10:09   Dg Chest Port 1 View  11/07/2015  CLINICAL DATA:  Aspiration pneumonia. EXAM: PORTABLE CHEST 1 VIEW COMPARISON:  11/06/2015. FINDINGS: Cardiomegaly with pulmonary vascular prominence and interstitial prominence consistent with congestive heart failure. Bibasilar pneumonia cannot be excluded . Small bilateral effusions. No pneumothorax. IMPRESSION: Findings suggest congestive heart failure with bilateral pulmonary edema and small pleural effusions. Bibasilar pneumonia cannot be excluded. No significant change from prior exam. Electronically Signed   By: Marcello Moores  Register   On: 11/07/2015 08:31    Medications:  Prior to Admission:  Prescriptions prior to admission  Medication Sig Dispense Refill Last Dose  . aspirin EC 81 MG EC tablet Take 1 tablet (81 mg total) by mouth daily.   11/05/2015 at Unknown time  . clotrimazole-betamethasone (LOTRISONE) cream Apply 1 application topically daily as needed (APPLY A THIN LAYER AS BARRIER AFTER CLEANING AND DRYING BUTTOCKS AREA COMPLETELY).   UNKNOWN  . FLUoxetine (PROZAC) 10 MG capsule TAKE 1 CAPSULE BY MOUTH DAILY FOR DEPRESSION/ANXIETY (Patient taking differently: TAKE 1 CAPSULE VIA PEG DAILY FOR DEPRESSION/ANXIETY) 90 capsule 1 11/05/2015 at Unknown time  . furosemide (LASIX) 20 MG tablet 20 mg by PEG Tube route daily.   11/05/2015 at Unknown time  . levETIRAcetam (KEPPRA) 750 MG tablet TAKE 1 TABLET BY MOUTH EVERY MORNING AND 2 TABLETS BY MOUTH EVERY NIGHT AT BEDTIME (Patient taking differently: TAKE 1 TABLET VIA PEG TUBE IN THE MORNING & 2 TABLETS VIA PEG TUBE EVERY NIGHT AT BEDTIME) 270 tablet 3 11/05/2015 at Unknown time  . loperamide (IMODIUM) 2 MG capsule  Take 1 capsule (2 mg total) by mouth every 4 (four) hours as needed for diarrhea or loose stools. 30 capsule 0 11/04/2015 at Unknown time  . metFORMIN (GLUCOPHAGE) 500 MG tablet Take one half tablet BID (Patient taking differently: 250 mg by PEG Tube route 2 (two) times daily with a meal. ) 30 tablet 5 11/05/2015 at Unknown time  . methylphenidate (RITALIN) 5 MG tablet Give one tablet per tube twice daily with breakfast and lunch 60 tablet 0 11/05/2015 at Unknown time  . metoCLOPramide (REGLAN) 10 MG tablet Place 1 tablet (10 mg total) into feeding tube every 6 (six) hours as needed for nausea.   11/05/2015 at Unknown time  . metoprolol tartrate (LOPRESSOR) 25 MG tablet Take 25 mg by mouth 2 (two) times daily.   11/05/2015 at 2100  . Nutritional Supplements (FEEDING SUPPLEMENT, VITAL AF 1.2 CAL,) LIQD Place 1,000 mLs into feeding tube daily.   11/05/2015 at Unknown time  . nystatin (MYCOSTATIN/NYSTOP) 100000 UNIT/GM POWD Apply topically 3 (three) times daily. **APPLY A DUSTING TO RASH ON PERINEUM AND BUTTOCKS AT [facility] SHIFT   UNKNOWN  . ondansetron (ZOFRAN ODT) 4 MG disintegrating tablet Take 1 tablet (4 mg total) by mouth every 8 (eight) hours as needed for nausea or vomiting. (Patient taking differently: 4 mg by PEG Tube route every 8 (eight) hours as needed for nausea or vomiting. ) 20 tablet 0 11/05/2015 at Unknown time  . pravastatin (PRAVACHOL) 40 MG tablet Take 1 tablet (40 mg total) by mouth daily. (Patient taking differently: 40 mg by PEG Tube route every evening. ) 30 tablet 12 11/05/2015 at Unknown time  . warfarin (COUMADIN) 4 MG tablet Take 4 mg by mouth every evening.   11/05/2015 at 1700  . [DISCONTINUED] ciprofloxacin (CIPRO) 250 MG tablet Place 1 tablet (250 mg total) into feeding tube 2 (two) times daily. 6 tablet 0 11/05/2015 at Unknown time  . Water For Irrigation, Sterile (FREE WATER) SOLN Place 200 mLs into feeding tube every 8 (eight) hours.   10/29/2015 at Unknown time    Scheduled: . antiseptic oral rinse  7 mL Mouth Rinse q12n4p  . aspirin  81 mg Per Tube Daily  . chlorhexidine  15 mL Mouth Rinse BID  . feeding supplement (VITAL AF 1.2 CAL)  1,000 mL Per Tube Q24H  . FLUoxetine  10 mg Per Tube Daily  . free water  200 mL Per Tube 3 times per day  . insulin aspart  0-9 Units Subcutaneous 6 times per day  . levETIRAcetam  1,500 mg Per Tube QHS  . levETIRAcetam  750 mg Per Tube Daily  . methylphenidate  5 mg Per Tube BID WC  . metoprolol tartrate  25 mg Per Tube BID  . piperacillin-tazobactam (ZOSYN)  IV  3.375 g Intravenous 3 times per day  . potassium chloride  40 mEq Per Tube Q4H  . pravastatin  40 mg Per Tube QPM  . sodium chloride  3 mL Intravenous Q12H  . Warfarin - Pharmacist Dosing Inpatient   Does not apply q1800   Continuous:  [id number] **OR** acetaminophen, albuterol, guaiFENesin-dextromethorphan, metoCLOPramide  Assesment: She has had acute change. The most likely events would be that she has aspirated again or that she has developed pulmonary edema. Although she is having some respiratory distress she is oxygenating fairly well on nasal oxygen but I will have her start BiPAP. 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 Discussed with Dr. Algis Liming and I agree with palliative care consult simply to establish goals of care. I don't think she needs intubation and mechanical ventilation at this point.    LOS: 2 days   Deairra Halleck L 11/08/2015, 2:10 PM

## 2015-11-08 NOTE — Consult Note (Signed)
Primary cardiologist: Dr Domenic Polite Consulting cardiologist: Dr Harl Bowie  Clinical Summary Laura Melendez is a 74 y.o.female history of HTN, DM2, history of left parotidectomy for malignant tumor recently at [facility], dysphagia with PEG tube, recurrent admissions for aspiration pneumonia, chronic diastolic HF, afib, hx of CVA, urinary retention with chronic foley, admitted 11/06/15 with SOB. Severely hypoxic to 80s on admit, was on bipap. Patient treated with abx and IV diuresis with intitial improvement in respiratory status, but acute change today.   K 3.1, Cr 0.61, BUN 24, Hgb 7.7, Plt 554, INR 3.38, BNP 3854 (>4500 10 days ago), trop neg CXR mild CHF, bilateral lower lobe opacities 342016 echo: LVEF 99991111, grade I diasotlic dysfunction   Medications Scheduled Medications: . antiseptic oral rinse  7 mL Mouth Rinse q12n4p  . aspirin  81 mg Per Tube Daily  . chlorhexidine  15 mL Mouth Rinse BID  . FLUoxetine  10 mg Per Tube Daily  . free water  200 mL Per Tube 3 times per day  . insulin aspart  0-9 Units Subcutaneous 6 times per day  . levETIRAcetam  1,500 mg Per Tube QHS  . levETIRAcetam  750 mg Per Tube Daily  . methylphenidate  5 mg Per Tube BID WC  . methylPREDNISolone (SOLU-MEDROL) injection  40 mg Intravenous Q12H  . metoprolol tartrate  25 mg Per Tube BID  . piperacillin-tazobactam (ZOSYN)  IV  3.375 g Intravenous 3 times per day  . potassium chloride  40 mEq Per Tube Q4H  . pravastatin  40 mg Per Tube QPM  . sodium chloride  3 mL Intravenous Q12H  . Warfarin - Pharmacist Dosing Inpatient   Does not apply q1800     Infusions:     PRN Medications:  acetaminophen **OR** acetaminophen, albuterol, guaiFENesin-dextromethorphan, metoCLOPramide   Past Medical History  Diagnosis Date  . Essential hypertension, benign   . Type 2 diabetes mellitus ([place])   . Osteoporosis 06/2008  . 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 History of parotid cancer     Past Surgical  History  Procedure Laterality Date  . Ankle surgery Left 2004    otif  . Abdominal hysterectomy    . Anterior and posterior repair N/A 03/11/2013    Procedure: ANTERIOR (CYSTOCELE) AND POSTERIOR REPAIR (RECTOCELE);  Surgeon: Linda Hedges, DO;  Location: [facility];  Service: Gynecology;  Laterality: N/A;  with sacrospinous ligament fixation bilateral  . Colonoscopy  2009    negative  . Cataract extraction w/phaco Right 12/27/2014    Procedure: CATARACT EXTRACTION PHACO AND INTRAOCULAR LENS PLACEMENT RIGHT EYE;  Surgeon: Tonny Taivon Haroon, MD;  Location: [facility];  Service: Ophthalmology;  Laterality: Right;  CDE:10.63  . Cataract extraction w/phaco Left 01/13/2015    Procedure: CATARACT EXTRACTION PHACO AND INTRAOCULAR LENS PLACEMENT LEFT EYE;  Surgeon: Tonny Ronne Savoia, MD;  Location: [facility];  Service: Ophthalmology;  Laterality: Left;  CDE:18.87  . Cholecystectomy    . Tee without cardioversion N/A 04/01/2015    Procedure: TRANSESOPHAGEAL ECHOCARDIOGRAM (TEE);  Surgeon: Sanda Klein, MD;  Location: [facility]OSCOPY;  Service: Cardiovascular;  Laterality: N/A;  . Peg placement    . Parotidectomy      Family History  Problem Relation Age of Onset  . Cancer Father     Liver  . Cancer Sister     Breast  . Diabetes Brother   . Dementia Mother     Social History [name] reports that she quit smoking about 8 months  ago. Her smoking use included Cigarettes. She has a 7.5 pack-year smoking history. She does not have any smokeless tobacco history on file. [name] reports that she does not drink alcohol.  Review of Systems CONSTITUTIONAL: No weight loss, fever, chills, weakness or fatigue.  HEENT: Eyes: No visual loss, blurred vision, double vision or yellow sclerae. No hearing loss, sneezing, congestion, runny nose or sore throat.  SKIN: No rash or itching.  CARDIOVASCULAR: No chest pain, chest pressure or chest discomfort. No palpitations or edema.  RESPIRATORY: +SOB  GASTROINTESTINAL: No anorexia,  nausea, vomiting or diarrhea. No abdominal pain or blood.  GENITOURINARY: no polyuria, no dysuria NEUROLOGICAL: No headache, dizziness, syncope, paralysis, ataxia, numbness or tingling in the extremities. No change in bowel or bladder control.  MUSCULOSKELETAL: No muscle, back pain, joint pain or stiffness.  HEMATOLOGIC: No anemia, bleeding or bruising.  LYMPHATICS: No enlarged nodes. No history of splenectomy.  PSYCHIATRIC: No history of depression or anxiety.      Physical Examination Blood pressure 148/68, pulse 84, temperature 98.6 F (37 C), temperature source Oral, resp. rate 18, height 5\' 6"  (1.676 m), weight 95 lb 0.3 oz (43.1 kg), SpO2 100 %.  Intake/Output Summary (Last 24 hours) at 11/08/15 1418 Last data filed at 11/08/15 1200  Gross per 24 hour  Intake    790 ml  Output   1627 ml  Net   -837 ml    HEENT: sclera clear  Cardiovascular: RRR, no m/r/g, no jvd  Respiratory: CTAB  GI: abdomen soft, NT, ND  MSK: no LE edema  Neuro: no focal deficits  Psych: appropriate affect   Lab Results  Basic Metabolic Panel:  Recent Labs Lab 11/02/15 0614 11/04/15 0745 11/06/15 0503 11/07/15 0416 11/08/15 0602  NA 142 139 141 139 139  K 3.6 4.2 4.4 4.0 3.1*  CL 107 104 105 103 103  CO2 28 29 26 28 29   GLUCOSE 138* 150* 262* 144* 186*  BUN 26* 31* 28* 31* 24*  CREATININE 0.35* 0.51 0.54 0.66 0.61  CALCIUM 8.4* 8.3* 8.8* 8.3* 8.1*  MG  --   --  2.2  --   --     Liver Function Tests:  Recent Labs Lab 11/04/15 0745  AST 17  ALT 17  ALKPHOS 42  BILITOT 0.3  PROT 5.1*  ALBUMIN 2.0*    CBC:  Recent Labs Lab 11/02/15 0614 11/04/15 0745 11/06/15 0503 11/07/15 0416 11/08/15 0602  WBC 8.4 8.6 11.5* 9.2 9.0  NEUTROABS  --   --  7.1  --   --   HGB 7.7* 8.0* 8.5* 7.5* 7.7*  HCT 25.4* 27.0* 27.9* 25.1* 24.5*  MCV 84.7 85.4 83.3 83.9 81.7  PLT 583* 593* 689* 572* 554*    Cardiac Enzymes:  Recent Labs Lab 11/06/15 0503  TROPONINI 0.03     BNP: Invalid input(s): POCBNP   ECG   Imaging   Impression/Recommendations  1. Acute on chronic diastolic HF - negative [id number] mL since admission per charting. She has been on lasix 40mg  IV bid, received 60mg  this AM in setting of respiratory distress. Weight documented as decreasing 4 lbs since admission. She appears euvolemic on exam. Repeat CXR today shows some pulm congestion - increase lasix to 60mg  IV bid. Will repeat echo as last study was 7 months ago and since that time has had recurrent CHF. No clear reason form this AM for her CHF to suddenly worsen as the etiology of her fairly acute SOB, unclear if repeat  aspiration episode involved with symptoms, agree therapeutic on coumadin PE is unlikely. F/u repeat EKG and echo    Carlyle Dolly, M.D.,

## 2015-11-08 NOTE — Care Management Note (Signed)
Case Management Note  Patient Details  Name: Laura Melendez MRN: [id number] Date of Birth: [birth year]  Subjective/Objective:                    Action/Plan:   Expected Discharge Date:  11/09/15               Expected Discharge Plan:  Skilled Nursing Facility  In-House Referral:  Clinical Social Work  Discharge planning Services  CM Consult  Post Acute Care Choice:  NA Choice offered to:  NA  DME Arranged:    DME Agency:     HH Arranged:    [place] Agency:     Status of Service:  Completed, signed off  Medicare Important Message Given:    Date Medicare IM Given:    Medicare IM give by:    Date Additional Medicare IM Given:    Additional Medicare Important Message give by:     If discussed at [place] of Stay Meetings, dates discussed:    Additional Comments: Pt transferred to ICU due to worsening respiratory failure. CM on receiving unit will follow for discharge planning needs. Christinia Gully Lengby, RN 11/08/2015, 2:32 PM

## 2015-11-08 NOTE — Progress Notes (Signed)
EKG reviewed. Deep inverted T-waves inferior and lateral precordial leads, similar but more prominent pattern to EKG from a few days ago. Long QTc 511 by my measurement, looking back this has also been present in prior EKGs. No chest pain, agree with cycling cardiac enzymes. Will obtain echo. She denies any chest pain. Low K of 3.1 this AM with replacement written for, Mg was 2.2 on 11/06/15. Will hold her fluoxetine and reglan. Other possibility for long QTc would be ischemia, follow troponins. Given her advanced debilitated state, if workup shows further evidence of ischemia she likely would be a poor candidate for cath.   Zandra Abts MD

## 2015-11-08 NOTE — Consult Note (Signed)
Consultation Note Date: 11/08/2015   Patient Name: Laura Melendez  DOB: 12/30/1940  MRN: [id number]  Age / Sex: 74 y.o., female   PCP: [name], MD Referring Physician: Modena Jansky, MD  Reason for Consultation: Establishing goals of care and Psychosocial/spiritual support  Palliative Care Assessment and Plan Summary of Established Goals of Care and Medical Treatment Preferences   Clinical Assessment/Narrative: Meeting with husband, son and daughter.  We discuss Laura Melendez illness, and her current decline.  Family states Laura Melendez has been weaker after every hospitalization.  We talk about the chronic illness trajectory.  They tell me the goal was to get her home, as long as Laura Melendez could transfer, but they are not sure that Laura Melendez will be able to do this now.  Son Marzetta Board states "Laura Melendez wishes Laura Melendez had never mentioned the mass in her cheek".    We talk about advanced directives, and Mr. Paeth tells me that he would not want to be intubated, and he doesn't think Laura Melendez would either. Son Marzetta Board agrees.  They decide to make her a DNI today.  We talk about the realities of chest compressions, and Mr. Shimp states he would like for PCP [name] to offer his thoughts.  I call to Dr. Wolfgang Phoenix, who agrees to meet with family at 5:45 this evening.   Contacts/Participants in Discussion: Primary Decision Maker: Laura Melendez is unable to make decisions at this time. Her husband and children are primary decision makers, they prefer to work together.     Code Status/Advance Care Planning:  DNI at this time  Mr. Hambley states he would not want to be intubated him self. "I don't think Laura Melendez would want this", but he is unable to make a decision towards DNR.   We focus on keeping Laura Melendez at the center of the discussion.   We have discussed the realities of chest compressions and rib fractures.   Psycho-social/Spiritual:   Support System: Lives with husband, 2 adult children.   Desire for further  Chaplaincy support:no  Prognosis: Unable to determine, likely less than 6 months.   Discharge Planning:  [place] for rehab with Palliative care service follow-up       Chief Complaint: Resp distress.  History of Present Illness: ZAYLIN KEYLON is an 74 y.o. female with multiple medical problem including embolic CVA with right hemiparesis, on anticoauglatuion with coumadin, hx of parotid cancer s/p excision, complicated with respiratory failure requiring intubation and pneumothorax requriing chest tube, hx of DM, HTN, PEG tube placement, diastolic CHF with EF 99991111, recent admission for sepsis, brought to the ER as Laura Melendez was having respiratory distress. Evaluation included a CXR showing vascular congestion, with no infiltrate, BNP greater than 4500, and troponin was elevated at 0.1. Her EKG showed ST with no acute ST T changes, and her WBC was elevated to 22K, with Hb of 18 g per dL, Her Cr was 0.48. Laura Melendez was placed on Bipap, and given IV Lasix of 40mg , and improved. Hospitalist was asked to admit her for respiratory failure, likely from diastolic CHF. When I see her, on Bipap, Laura Melendez was in no distress, alert, and follow simple verbal command.   Primary Diagnoses  Present on Admission:  . (Resolved) Respiratory failure ([place]) . Acute on chronic diastolic CHF (congestive heart failure) ([place]) . Acute respiratory failure with hypoxia ([place]) . 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 Aspiration pneumonia ([place]) . Dyslipidemia . Essential hypertension, benign . Malnutrition of moderate degree  Palliative Review of Systems: Denies pain.  I have reviewed the medical record, interviewed the patient and family, and examined the patient. The following aspects are pertinent.  Past Medical History  Diagnosis Date  . Essential hypertension, benign   . Type 2 diabetes mellitus ([place])   . Osteoporosis 06/2008  . 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 History of  parotid cancer    Social History   Social History  . Marital Status: Married    Spouse Name: N/A  . Number of Children: 3  . Years of Education: 12   Occupational History  . Retired    Social History Main Topics  . Smoking status: Former Smoker -- 0.25 packs/day for 30 years    Types: Cigarettes    Quit date: 02/27/2015  . Smokeless tobacco: None  . Alcohol Use: No  . Drug Use: No  . Sexual Activity: Yes    Birth Control/ Protection: Surgical   Other Topics Concern  . None   Social History Narrative   Lives at home with her husband.   Right-handed.   2 cups caffeine daily.   Family History  Problem Relation Age of Onset  . Cancer Father     Liver  . Cancer Sister     Breast  . Diabetes Brother   . Dementia Mother    Scheduled Meds: . antiseptic oral rinse  7 mL Mouth Rinse q12n4p  . aspirin  81 mg Per Tube Daily  . chlorhexidine  15 mL Mouth Rinse BID  . free water  200 mL Per Tube 3 times per day  . insulin aspart  0-9 Units Subcutaneous 6 times per day  . ipratropium-albuterol  3 mL Nebulization Q6H  . levETIRAcetam  1,500 mg Per Tube QHS  . levETIRAcetam  750 mg Per Tube Daily  . methylphenidate  5 mg Per Tube BID WC  . methylPREDNISolone (SOLU-MEDROL) injection  40 mg Intravenous Q12H  . metoprolol tartrate  25 mg Per Tube BID  . piperacillin-tazobactam (ZOSYN)  IV  3.375 g Intravenous 3 times per day  . pravastatin  40 mg Per Tube QPM  . sodium chloride  3 mL Intravenous Q12H  . Warfarin - Pharmacist Dosing Inpatient   Does not apply q1800   Continuous Infusions:  PRN Meds:.acetaminophen **OR** acetaminophen, albuterol, guaiFENesin-dextromethorphan Medications Prior to Admission:  Prior to Admission medications   Medication Sig Start Date End Date Taking? Authorizing Provider  aspirin EC 81 MG EC tablet Take 1 tablet (81 mg total) by mouth daily. 11/02/15  Yes Estela Leonie Green, MD  clotrimazole-betamethasone (LOTRISONE) cream Apply 1  application topically daily as needed (APPLY A THIN LAYER AS BARRIER AFTER CLEANING AND DRYING BUTTOCKS AREA COMPLETELY).   Yes Historical Provider, MD  FLUoxetine (PROZAC) 10 MG capsule TAKE 1 CAPSULE BY MOUTH DAILY FOR DEPRESSION/ANXIETY Patient taking differently: TAKE 1 CAPSULE VIA PEG DAILY FOR DEPRESSION/ANXIETY 09/15/15  Yes [name], MD  furosemide (LASIX) 20 MG tablet 20 mg by PEG Tube route daily.   Yes Historical Provider, MD  levETIRAcetam (KEPPRA) 750 MG tablet TAKE 1 TABLET BY MOUTH EVERY MORNING AND 2 TABLETS BY MOUTH EVERY NIGHT AT BEDTIME Patient taking differently: TAKE 1 TABLET VIA PEG TUBE IN THE MORNING & 2 TABLETS VIA PEG TUBE EVERY NIGHT AT BEDTIME 08/01/15  Yes [name], MD  loperamide (IMODIUM) 2 MG capsule Take 1 capsule (2 mg total) by mouth every 4 (four) hours as needed for diarrhea  or loose stools. 10/21/15  Yes [name], MD  metFORMIN (GLUCOPHAGE) 500 MG tablet Take one half tablet BID Patient taking differently: 250 mg by PEG Tube route 2 (two) times daily with a meal.  04/25/15  Yes [name], MD  methylphenidate (RITALIN) 5 MG tablet Give one tablet per tube twice daily with breakfast and lunch 11/03/15  Yes [name], DO  metoCLOPramide (REGLAN) 10 MG tablet Place 1 tablet (10 mg total) into feeding tube every 6 (six) hours as needed for nausea. 10/21/15  Yes [name], MD  metoprolol tartrate (LOPRESSOR) 25 MG tablet Take 25 mg by mouth 2 (two) times daily.   Yes Historical Provider, MD  Nutritional Supplements (FEEDING SUPPLEMENT, VITAL AF 1.2 CAL,) LIQD Place 1,000 mLs into feeding tube daily. 10/21/15  Yes [name], MD  nystatin (MYCOSTATIN/NYSTOP) 100000 UNIT/GM POWD Apply topically 3 (three) times daily. **APPLY A DUSTING TO RASH ON PERINEUM AND BUTTOCKS AT [facility] SHIFT   Yes Historical Provider, MD  ondansetron (ZOFRAN ODT) 4 MG disintegrating tablet Take 1 tablet (4 mg total) by mouth every 8 (eight) hours as needed for nausea or  vomiting. Patient taking differently: 4 mg by PEG Tube route every 8 (eight) hours as needed for nausea or vomiting.  10/08/15  Yes [name], MD  pravastatin (PRAVACHOL) 40 MG tablet Take 1 tablet (40 mg total) by mouth daily. Patient taking differently: 40 mg by PEG Tube route every evening.  07/20/15  Yes [name], MD  warfarin (COUMADIN) 4 MG tablet Take 4 mg by mouth every evening.   Yes Historical Provider, MD  Water For Irrigation, Sterile (FREE WATER) SOLN Place 200 mLs into feeding tube every 8 (eight) hours. 10/21/15   [name], MD   No Known Allergies CBC:    Component Value Date/Time   WBC 9.0 11/08/2015 0602   HGB 7.7* 11/08/2015 0602   HCT 24.5* 11/08/2015 0602   PLT 554* 11/08/2015 0602   MCV 81.7 11/08/2015 0602   NEUTROABS 7.1 11/06/2015 0503   LYMPHSABS 3.5 11/06/2015 0503   MONOABS 0.7 11/06/2015 0503   EOSABS 0.2 11/06/2015 0503   BASOSABS 0.1 11/06/2015 0503   Comprehensive Metabolic Panel:    Component Value Date/Time   NA 139 11/08/2015 0602   NA 142 02/21/2015 0810   K 3.1* 11/08/2015 0602   CL 103 11/08/2015 0602   CO2 29 11/08/2015 0602   BUN 24* 11/08/2015 0602   BUN 16 02/21/2015 0810   CREATININE 0.61 11/08/2015 0602   CREATININE 0.66 09/06/2014 0925   GLUCOSE 186* 11/08/2015 0602   GLUCOSE 130* 02/21/2015 0810   CALCIUM 8.1* 11/08/2015 0602   AST 17 11/04/2015 0745   ALT 17 11/04/2015 0745   ALKPHOS 42 11/04/2015 0745   BILITOT 0.3 11/04/2015 0745   PROT 5.1* 11/04/2015 0745   ALBUMIN 2.0* 11/04/2015 0745    Physical Exam: Vital Signs: BP 109/53 mmHg  Pulse 69  Temp(Src) 98.2 F (36.8 C) (Axillary)  Resp 21  Ht 5\' 6"  (1.676 m)  Wt 43.1 kg (95 lb 0.3 oz)  BMI 15.34 kg/m2  SpO2 100% SpO2: SpO2: 100 % O2 Device: O2 Device: Nasal Cannula O2 Flow Rate: O2 Flow Rate (L/min): 3 L/min Intake/output summary:  Intake/Output Summary (Last 24 hours) at 11/08/15 1751 Last data filed at 11/08/15 1200  Gross per 24 hour  Intake     790 ml  Output   1202 ml  Net   -412 ml  LBM: Last BM Date: 11/08/15 Baseline Weight: Weight: 42.893 kg (94 lb 9 oz) Most recent weight: Weight: 43.1 kg (95 lb 0.3 oz)  Exam Findings:  Constitutional:  Elderly, frail, lying in bed.  Resp: BiPAP Psych: calm.          Palliative Performance Scale: 10%              Additional Data Reviewed: Recent Labs     11/07/15  0416  11/08/15  0602  WBC  9.2  9.0  HGB  7.5*  7.7*  PLT  572*  554*  NA  139  139  BUN  31*  24*  CREATININE  0.66  0.61     Time In:  1620 Time Out: 1740 Time Total:  80 minutes  Greater than 50%  of this time was spent counseling and coordinating care related to the above assessment and plan.  Signed by: Drue Novel, NP  Drue Novel, NP  11/08/2015, 5:51 PM  Please contact Palliative Medicine Team phone at [phone] for questions and concerns.

## 2015-11-08 NOTE — Plan of Care (Signed)
Problem: Coping: Goal: Verbalizations of decreased anxiety will increase Outcome: Not Progressing Had to request medication for anxiety.

## 2015-11-08 NOTE — Progress Notes (Signed)
OT Cancellation Note  Patient Details Name: Laura NAVARRETTE MRN: [id number] DOB: 1941/06/14   Cancelled Treatment:     Reason evaluation not complete: Pt level of participation. Pt easily awakened this am, reports no pain when questioned. Pt would not answer any additional questions or give any indication she would be willing to work with OT this am. She appears to be depressed this am, noted in chart anxiety medication was requested early this am. Will attempt to work with pt again tomorrow.   Guadelupe Sabin, OTR/L  [phone] 11/08/2015, 8:56 AM

## 2015-11-08 NOTE — Progress Notes (Signed)
Addendum  Seen in ICU couple hours after transfer. On BIPAP. Appears comfortable and in no distress. Discussed with RN.  Vernell Leep, MD, FACP, FHM. [facility] Pager [phone]  If 7PM-7AM, please contact night-coverage [email or web address] Password [email or web address] 11/08/2015, 4:15 PM

## 2015-11-08 NOTE — Progress Notes (Signed)
ANTICOAGULATION CONSULT NOTE   Pharmacy Consult forCoumadin Indication: atrial fibrillation  No Known Allergies  Patient Measurements: Height: 5\' 6"  (167.6 cm) Weight: 95 lb 0.3 oz (43.1 kg) IBW/kg (Calculated) : 59.3kg  Vital Signs: Temp: 98.4 F (36.9 C) (11/22 0415) Temp Source: Oral (11/22 0415) BP: 149/67 mmHg (11/22 1025) Pulse Rate: 88 (11/22 1025)  Labs:  Recent Labs  11/06/15 0503 11/06/15 0508 11/07/15 0416 11/08/15 0602  HGB 8.5*  --  7.5* 7.7*  HCT 27.9*  --  25.1* 24.5*  PLT 689*  --  572* 554*  LABPROT  --  22.1* 30.5* 33.5*  INR  --  1.95* 2.99* 3.38*  CREATININE 0.54  --  0.66 0.61  TROPONINI 0.03  --   --   --     Estimated Creatinine Clearance: 42 mL/min (by C-G formula based on Cr of 0.61).   Medical History: Past Medical History  Diagnosis Date  . Essential hypertension, benign   . Type 2 diabetes mellitus ([place])   . Osteoporosis 06/2008  . 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 History of parotid cancer     Medications:  See med rec  Assessment: Pt cont on empiric tx PNA with Zosyn.Afeb, WBC decreased.   Chronic afib on coumadin. INR remains elevated this am Hg low but stable.  No bleeding reported  Goal of Therapy:  INR 2-3 Monitor platelets by anticoagulation protocol: Yes   Plan:  Hold coumadin today Cont zosyn Daily PT/INR Monitor for S/S of bleeding  Thanks for allowing pharmacy to be a part of this patient's care.  Excell Seltzer, PharmD Clinical Pharmacist 11/08/2015,11:30 AM

## 2015-11-08 NOTE — Progress Notes (Signed)
Speech Pathology  SLP came to see pt, however she has now moved to ICU due to decline earlier today. I spoke with RN upstairs who reports that pt did not eat anything today besides what I (SLP) gave her this AM. Pt now in room, off bi-PAP and verbalizes "I'm fine" when asked. Pt able to swallow upon SLP request and vocal quality breathy, but clear. No PO trials given at this time due to move to higher level of care. SLP will follow per direction of MD and palliative care.  Thank you,  Genene Churn, CCC-SLP [phone]

## 2015-11-08 NOTE — Progress Notes (Signed)
Speech Language Pathology Treatment: Dysphagia  Patient Details Name: Laura Melendez MRN: [id number] DOB: Apr 22, 1941 Today's Date: 11/08/2015 Time: 0900-0940 SLP Time Calculation (min) (ACUTE ONLY): 40 min  Assessment / Plan / Recommendation Clinical Impression  Mrs. Burling more lethargic this AM, however her husband reports that he feels she may be fatigued from being up in chair yesterday. She ate only a few bites of her dinner meal yesterday due to fatigue. Also note that pt received anxiety medication at 4 AM so perhaps she is just tired this AM. Pt agreeable to trying breakfast and repositioned in bed. She was slower to respond today, but opened mouth for teaspoon presentations of oatmeal. Decreased left labial closure notable and pt required cues to purse lips, move tongue, and swallow. Pt held bolus in oral cavity for up to 5 seconds before swallowing after SLP cues. Husband arrived during our visit and swallow precautions verbalized and demonstrated for him. He reports feeling frustrated about fluctuations in her alertness.   MD also arrived during session and discussed palliative care consult with husband. SLP explained multifactorial reasons for current dysphagia (previous stroke with dysphagia, prolonged hospitalization, recent surgery with CN 7 dissection, lethargy) and husband acknowledged understanding. We also discussed being hopeful that she would tolerate puree diet with thickened liquids and consider decreasing tube feeds when appropriate to help encourage po intake. Pt only consumed ~10 tsp overall today (orange juice and oatmeal) before declining further (despite encouragement from SLP). Will attempt to see pt one more time today as schedule permits.    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 fib on Coumadin, osteoporosis, pneumothorax status post chest tube, HTN, chronic indwelling Foley catheter for the last 2 months secondary to urinary retention, multiple hospitalizations (4 in the last 2 months), recent admission to [facility] 10/29/15-11/02/15 for acute hypoxic respiratory failure due to acute on chronic diastolic CHF, admitted to [facility] on 11/06/15 with new onset dyspnea, hypoxia and suspected aspiration. She was admitted to stepdown unit for aspiration pneumonia, decompensated diastolic CHF and acute respiratory failure with hypoxia. Improved. Transferred to telemetry on 11/21. Reports of pt being found supine in bed at SNF on Saturday with worry for aspiration event (tube feeds and emesis). SLP asked to evaluate pt at bedside due to history of dysphagia.      SLP Plan  Continue with current plan of care     Recommendations  Diet recommendations: Dysphagia 1 (puree);Honey-thick liquid Liquids provided via: Teaspoon Medication Administration: Via alternative means Supervision: Staff to assist with self feeding;Full supervision/cueing for compensatory strategies Compensations: Minimize environmental distractions;Small sips/bites;Slow rate;Lingual sweep for clearance of pocketing;Monitor for anterior loss;Clear throat intermittently;Effortful swallow Postural Changes and/or Swallow Maneuvers: Seated upright 90 degrees;Upright 30-60 min after meal             Oral Care Recommendations: Staff/trained caregiver to provide oral care;Oral care before and after PO Follow up Recommendations: Skilled Nursing facility Plan: Continue with current plan of care   Thank you,  Genene Churn, [place]  [place] 11/08/2015, 9:41 AM

## 2015-11-08 NOTE — Progress Notes (Signed)
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 fib on Coumadin, osteoporosis, pneumothorax status post chest tube, HTN, chronic indwelling Foley catheter for the last 2 months secondary to urinary retention, multiple hospitalizations (4 in the last 2 months), recent admission to [facility] 10/29/15-11/02/15 for acute hypoxic respiratory failure due to acute on chronic diastolic CHF, admitted to [facility] on 11/06/15 with new onset dyspnea, hypoxia and suspected aspiration. She was admitted to stepdown unit for aspiration pneumonia, decompensated diastolic CHF and acute respiratory failure with hypoxia. Improved. Transferred to telemetry on 11/21.   Assessment/Plan:  Aspiration pneumonia, tube feeds/gastric contents, recurrent - As per family, patient apparently found supine in bed at SNF which would contribute to aspiration. Patient to be nursed head up while on tube feeds. - Treating empirically with IV vancomycin and Zosyn - Check Procalcitonin <0.1 - Improving. No fevers. Leukocytosis resolved.  - DC vancomycin. Continue Zosyn for additional 24 hours and then consider transitioning to Augmentin via PEG tube  - Blood cultures 2 negative to date.   Acute on chronic diastolic CHF - Changed Lasix to 40 mg IV every 12 hourly on admission. - BNP on admission: 3854 - clinically improved but intermittently mildly tachypnea. Not much peripheral edema but repeated chest x-ray today suggests persistent mild CHF. - Changed and gave Lasix 60 mg IV 1. Follow BNP and clinically in a.m.  Acute respiratory failure with hypoxia -  Secondary to aspiration pneumonia and decompensated CHF. - Presented with oxygen saturations in the mid 80s. Transiently placed on BiPAP. - hypoxia resolved.   Hypokalemia - Secondary to diuresis. Replace aggressively and follow BMP in a.m.  Essential hypertension - Controlled. Resumed home dose of metoprolol.  Type II DM - Sliding-scale insulin. Controlled.  Chronic atrial fibrillation - Continue Coumadin per pharmacy and metoprolol. INR 11/21: 3.38.  Dysphagia status post PEG tube - Resumed tube feeds. Dietitian consultation for tube feed management.  - Speech therapy evaluation appreciated. Recommend dysphagia 1 diet and honey thickened liquids.  Urinary retention - Has had chronic indwelling Foley catheter for the last 2 months. Last changed on 11/19 at SNF. Outpatient urology consultation.  CVA with residual right hemiparesis - Continue Coumadin anticoagulation. PT and OT evaluation  Possible seizure disorder - Continue Keppra  Chronic anemia - Hemoglobin has dropped from 8.5 on 11/20 to 7.5 on 11/21. Hemoglobin has been fluctuating in the 7. 5-8 range over the last several days. No reported bleeding. Follow CBC in a.m. Transfuse PRBCs if hemoglobin less than 7 g per DL. - Hemoglobin stable in the mid 7 g per DL range.   Left parotidectomy for malignant tumor  Thrombocytosis, possibly reactive - Improving.  Prolonged QTC - Repeat EKG: QTC 5:15 milliseconds. Magnesium: 2.2. Minimize QT prolonging medications. -Unclear etiology. Replace potassium today. Continue monitoring on telemetry. Follow EKG in a.m.    Recently treated Escherichia coli UTI  Adult failure to thrive  - Had extensive discussion with patient's spouse at bedside on 11/22. Patient has had a complicated   course since her surgery early October 2016. Recommended palliative care consultation for goals of care which they were agreeable to.     DVT prophylaxis:  Anticoagulated on Coumadin Code Status:  Full  Family Communication: Discussed with son at bedside on 11/20. None at bedside. Disposition Plan: DC back to SNF when medically stable, possibly in 1-2 days.    Consultants:  Palliative care team   Procedures:  Indwelling Foley catheter-present since PTA.  BiPAP on admission-discontinued  Antibiotics:  IV vancomycin 11/20 > 11/22   IV Zosyn 11/20 >   Subjective: Denies complaints. Denies dyspnea or chest pain. As per RN, no acute events.   Objective: Filed Vitals:   11/07/15 2209 11/08/15 0415 11/08/15 0636 11/08/15 1025  BP: 147/62 138/67  149/67  Pulse: 89 79  88  Temp: 98.3 F (36.8 C) 98.4 F (36.9 C)    TempSrc: Oral Oral    Resp: 20 20    Height:      Weight:   43.1 kg (95 lb 0.3 oz)   SpO2: 94% 95%      Intake/Output Summary (Last 24 hours) at 11/08/15 1303 Last data filed at 11/08/15 1200  Gross per 24 hour  Intake    790 ml  Output   1627 ml  Net   -837 ml   Filed Weights   11/06/15 0805 11/07/15 0400 11/08/15 0636  Weight: 45 kg (99 lb 3.3 oz) 42.2 kg (93 lb 0.6 oz) 43.1 kg (95 lb 0.3 oz)     Exam:  General exam: Moderately built and thinly nourished pleasant elderly female patient, sitting up in bed getting ready to eat breakfast this morning . Respiratory system: Clear anteriorly. Slightly diminished breath sounds in the bases with few basal crackles . Rest of lung fields clear to auscultation. No increased work of breathing. Cardiovascular system: S1 & S2 heard, RRR. No JVD, murmurs, gallops, clicks or pedal edema. Telemetry: Sinus rhythm Gastrointestinal system: Abdomen is nondistended, soft and nontender. Normal bowel sounds heard. Central nervous system: Alert and oriented 2. No focal neurological deficits. Extremities: Symmetric 5 x 5 power.   Data Reviewed: Basic Metabolic Panel:  Recent Labs Lab 11/02/15 0614 11/04/15 0745 11/06/15 0503 11/07/15 0416 11/08/15 0602  NA 142 139 141 139 139  K 3.6 4.2 4.4 4.0 3.1*  CL  107 104 105 103 103  CO2 28 29 26 28 29   GLUCOSE 138* 150* 262* 144* 186*  BUN 26* 31* 28* 31* 24*  CREATININE 0.35* 0.51 0.54 0.66 0.61  CALCIUM 8.4* 8.3* 8.8* 8.3* 8.1*  MG  --   --  2.2  --   --    Liver Function Tests:  Recent Labs Lab 11/04/15 0745  AST 17  ALT 17  ALKPHOS 42  BILITOT 0.3  PROT 5.1*  ALBUMIN 2.0*   No results for input(s): LIPASE, AMYLASE in the last 168 hours. No results for input(s): AMMONIA in the last 168 hours. CBC:  Recent Labs Lab 11/02/15 0614 11/04/15 0745 11/06/15 0503 11/07/15 0416 11/08/15 0602  WBC 8.4 8.6 11.5* 9.2 9.0  NEUTROABS  --   --  7.1  --   --   HGB 7.7* 8.0* 8.5* 7.5* 7.7*  HCT 25.4* 27.0* 27.9* 25.1* 24.5*  MCV 84.7 85.4 83.3 83.9 81.7  PLT 583* 593* 689* 572* 554*   Cardiac Enzymes:  Recent Labs Lab 11/06/15 0503  TROPONINI 0.03   BNP (last 3 results) No results for input(s): PROBNP in the last 8760 hours. CBG:  Recent Labs Lab 11/07/15 2049 11/08/15 0033 11/08/15 0353 11/08/15 0740 11/08/15 1122  GLUCAP 121* 149* 111* 176* 117*    Recent  Results (from the past 240 hour(s))  Blood culture (routine x 2)     Status: None   Collection Time: 10/29/15  8:40 PM  Result Value Ref Range Status   Specimen Description BLOOD RIGHT ANTECUBITAL  Final   Special Requests   Final    BOTTLES DRAWN AEROBIC AND ANAEROBIC AEB=4CC ANA=3CC   Culture NO GROWTH 5 DAYS  Final   Report Status 11/03/2015 FINAL  Final  Blood culture (routine x 2)     Status: None   Collection Time: 10/29/15  8:43 PM  Result Value Ref Range Status   Specimen Description BLOOD LEFT ANTECUBITAL  Final   Special Requests BOTTLES DRAWN AEROBIC AND ANAEROBIC 3CC EACH  Final   Culture NO GROWTH 5 DAYS  Final   Report Status 11/03/2015 FINAL  Final  Culture, Urine     Status: None   Collection Time: 10/31/15 10:33 AM  Result Value Ref Range Status   Specimen Description URINE, CATHETERIZED  Final   Special Requests NONE  Final   Culture    Final    >=100,000 COLONIES/mL ESCHERICHIA COLI Performed at [facility]    Report Status 11/02/2015 FINAL  Final   Organism ID, Bacteria ESCHERICHIA COLI  Final      Susceptibility   Escherichia coli - MIC*    AMPICILLIN <=2 SENSITIVE Sensitive     CEFAZOLIN <=4 SENSITIVE Sensitive     CEFTRIAXONE <=1 SENSITIVE Sensitive     CIPROFLOXACIN <=0.25 SENSITIVE Sensitive     GENTAMICIN <=1 SENSITIVE Sensitive     IMIPENEM <=0.25 SENSITIVE Sensitive     NITROFURANTOIN <=16 SENSITIVE Sensitive     TRIMETH/SULFA <=20 SENSITIVE Sensitive     AMPICILLIN/SULBACTAM <=2 SENSITIVE Sensitive     PIP/TAZO <=4 SENSITIVE Sensitive     * >=100,000 COLONIES/mL ESCHERICHIA COLI  Urine culture     Status: None   Collection Time: 11/06/15  6:02 AM  Result Value Ref Range Status   Specimen Description URINE, CLEAN CATCH  Final   Special Requests NONE  Final   Culture   Final    60,000 COLONIES/ml YEAST Performed at [facility]    Report Status 11/08/2015 FINAL  Final  Culture, blood (routine x 2)     Status: None (Preliminary result)   Collection Time: 11/06/15  6:56 AM  Result Value Ref Range Status   Specimen Description BLOOD RIGHT HAND  Final   Special Requests BOTTLES DRAWN AEROBIC AND ANAEROBIC 4CC EACH  Final   Culture NO GROWTH 2 DAYS  Final   Report Status PENDING  Incomplete  Culture, blood (routine x 2)     Status: None (Preliminary result)   Collection Time: 11/06/15  7:04 AM  Result Value Ref Range Status   Specimen Description BLOOD LEFT HAND  Final   Special Requests BOTTLES DRAWN AEROBIC AND ANAEROBIC [place]  Final   Culture NO GROWTH 2 DAYS  Final   Report Status PENDING  Incomplete         Studies: Dg Chest Port 1 View  11/08/2015  CLINICAL DATA:  Pneumonia, cough EXAM: PORTABLE CHEST 1 VIEW COMPARISON:  11/07/2015 FINDINGS: Cardiomegaly with vascular congestion. Bilateral layering effusions and lower lobe opacities, likely edema. Findings are similar  to prior study. No acute bony abnormality. IMPRESSION: Suspect mild CHF with bilateral lower lobe opacities, likely edema and layering effusions. No real change. Electronically Signed   By: Rolm Baptise M.D.   On: 11/08/2015 10:09   Dg  Chest Port 1 View  11/07/2015  CLINICAL DATA:  Aspiration pneumonia. EXAM: PORTABLE CHEST 1 VIEW COMPARISON:  11/06/2015. FINDINGS: Cardiomegaly with pulmonary vascular prominence and interstitial prominence consistent with congestive heart failure. Bibasilar pneumonia cannot be excluded . Small bilateral effusions. No pneumothorax. IMPRESSION: Findings suggest congestive heart failure with bilateral pulmonary edema and small pleural effusions. Bibasilar pneumonia cannot be excluded. No significant change from prior exam. Electronically Signed   By: Marcello Moores  Register   On: 11/07/2015 08:31        Scheduled Meds: . antiseptic oral rinse  7 mL Mouth Rinse q12n4p  . aspirin  81 mg Per Tube Daily  . chlorhexidine  15 mL Mouth Rinse BID  . feeding supplement (VITAL AF 1.2 CAL)  1,000 mL Per Tube Q24H  . FLUoxetine  10 mg Per Tube Daily  . free water  200 mL Per Tube 3 times per day  . furosemide  60 mg Intravenous Once  . insulin aspart  0-9 Units Subcutaneous 6 times per day  . levETIRAcetam  1,500 mg Per Tube QHS  . levETIRAcetam  750 mg Per Tube Daily  . methylphenidate  5 mg Per Tube BID WC  . metoprolol tartrate  25 mg Per Tube BID  . piperacillin-tazobactam (ZOSYN)  IV  3.375 g Intravenous 3 times per day  . potassium chloride  40 mEq Per Tube Q4H  . pravastatin  40 mg Per Tube QPM  . sodium chloride  3 mL Intravenous Q12H  . 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 [facility] Pager [phone]  If 7PM-7AM, please contact night-coverage [email or web address] Password [email or web address] 11/08/2015, 1:03 PM    LOS: 2 days

## 2015-11-09 ENCOUNTER — Encounter (HOSPITAL_COMMUNITY): Payer: Self-pay | Admitting: Physical Therapy

## 2015-11-09 ENCOUNTER — Inpatient Hospital Stay (HOSPITAL_COMMUNITY): Payer: Medicare HMO

## 2015-11-09 ENCOUNTER — Encounter (HOSPITAL_COMMUNITY)
Admission: AD | Admit: 2015-11-09 | Discharge: 2015-11-09 | Disposition: A | Discharge status: Home / Self Care | Payer: Medicare HMO | Source: Skilled Nursing Facility | Attending: Internal Medicine | Admitting: Internal Medicine

## 2015-11-09 DIAGNOSIS — Z515 Encounter for palliative care: Secondary | ICD-10-CM | POA: Insufficient documentation

## 2015-11-09 DIAGNOSIS — Z7189 Other specified counseling: Secondary | ICD-10-CM | POA: Insufficient documentation

## 2015-11-09 DIAGNOSIS — R0600 Dyspnea, unspecified: Secondary | ICD-10-CM

## 2015-11-09 LAB — BASIC METABOLIC PANEL
Anion gap: 9 (ref 5–15)
BUN: 28 mg/dL — AB (ref 6–20)
CHLORIDE: 108 mmol/L (ref 101–111)
CO2: 28 mmol/L (ref 22–32)
CREATININE: 0.83 mg/dL (ref 0.44–1.00)
Calcium: 8.8 mg/dL — ABNORMAL LOW (ref 8.9–10.3)
GFR calc Af Amer: >60 mL/min (ref >60)
Glucose, Bld: 101 mg/dL — ABNORMAL HIGH (ref 65–99)
Potassium: 5.3 mmol/L — ABNORMAL HIGH (ref 3.5–5.1)
SODIUM: 145 mmol/L (ref 135–145)

## 2015-11-09 LAB — BLOOD GAS, ARTERIAL
Acid-Base Excess: 2.8 mmol/L — ABNORMAL HIGH (ref 0.0–2.0)
Bicarbonate: 27.1 mEq/L — ABNORMAL HIGH (ref 20.0–24.0)
Drawn by: 382351
FIO2: 0.21
O2 SAT: 87.6 %
PCO2 ART: 34.1 mmHg — AB (ref 35.0–45.0)
PO2 ART: 56.9 mmHg — AB (ref 80.0–100.0)
Patient temperature: 37
pH, Arterial: 7.495 — ABNORMAL HIGH (ref 7.350–7.450)

## 2015-11-09 LAB — CBC
HCT: 27.6 % — ABNORMAL LOW (ref 36.0–46.0)
Hemoglobin: 8.4 g/dL — ABNORMAL LOW (ref 12.0–15.0)
MCH: 25.1 pg — AB (ref 26.0–34.0)
MCHC: 30.4 g/dL (ref 30.0–36.0)
MCV: 82.6 fL (ref 78.0–100.0)
PLATELETS: 627 10*3/uL — AB (ref 150–400)
RBC: 3.34 MIL/uL — ABNORMAL LOW (ref 3.87–5.11)
RDW: 17 % — ABNORMAL HIGH (ref 11.5–15.5)
WBC: 10.4 10*3/uL (ref 4.0–10.5)

## 2015-11-09 LAB — PROTIME-INR
INR: 2.58 — AB (ref 0.00–1.49)
Prothrombin Time: 27.3 seconds — ABNORMAL HIGH (ref 11.6–15.2)

## 2015-11-09 LAB — GLUCOSE, CAPILLARY
GLUCOSE-CAPILLARY: 135 mg/dL — AB (ref 65–99)
Glucose-Capillary: 100 mg/dL — ABNORMAL HIGH (ref 65–99)
Glucose-Capillary: 154 mg/dL — ABNORMAL HIGH (ref 65–99)
Glucose-Capillary: 80 mg/dL (ref 65–99)
Glucose-Capillary: 140 mg/dL — ABNORMAL HIGH (ref 65–99)

## 2015-11-09 LAB — PROCALCITONIN: Procalcitonin: <0.1 ng/mL

## 2015-11-09 LAB — TROPONIN I: Troponin I: 0.03 ng/mL (ref <0.031)

## 2015-11-09 LAB — MAGNESIUM: Magnesium: 2.1 mg/dL (ref 1.7–2.4)

## 2015-11-09 LAB — BRAIN NATRIURETIC PEPTIDE

## 2015-11-09 MED ORDER — FUROSEMIDE 10 MG/ML IJ SOLN
60.0000 mg | Freq: Two times a day (BID) | INTRAMUSCULAR | Status: AC
  Administered 2015-11-09 (×2): 60 mg via INTRAVENOUS
  Filled 2015-11-09 (×2): qty 6

## 2015-11-09 MED ORDER — VITAL HIGH PROTEIN PO LIQD: 1000.0000 mL | ORAL | Status: DC

## 2015-11-09 MED ORDER — WARFARIN SODIUM 2 MG PO TABS
2.0000 mg | ORAL_TABLET | Freq: Once | ORAL | Status: AC
  Administered 2015-11-09: 2 mg via ORAL
  Filled 2015-11-09: qty 1

## 2015-11-09 MED ORDER — AMOXICILLIN-POT CLAVULANATE 875-125 MG PO TABS
1.0000 | ORAL_TABLET | Freq: Two times a day (BID) | ORAL | Status: DC
Start: 2015-11-09 — End: 2015-11-15
  Administered 2015-11-09 – 2015-11-15 (×13): 1
  Filled 2015-11-09 (×18): qty 1

## 2015-11-09 MED ORDER — VITAL AF 1.2 CAL PO LIQD
1000.0000 mL | ORAL | Status: DC
  Administered 2015-11-09 – 2015-11-13 (×3): 1000 mL
  Filled 2015-11-09 (×7): qty 1000

## 2015-11-09 MED ORDER — IPRATROPIUM-ALBUTEROL 0.5-2.5 (3) MG/3ML IN SOLN
3.0000 mL | Freq: Four times a day (QID) | RESPIRATORY_TRACT | Status: DC
  Administered 2015-11-10 – 2015-11-13 (×13): 3 mL via RESPIRATORY_TRACT
  Filled 2015-11-09 (×14): qty 3

## 2015-11-09 NOTE — Progress Notes (Signed)
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 fib on Coumadin, osteoporosis, pneumothorax status post chest tube, HTN, chronic indwelling Foley catheter for the last 2 months secondary to urinary retention, multiple hospitalizations (4 in the last 2 months), recent admission to [facility] 10/29/15-11/02/15 for acute hypoxic respiratory failure due to acute on chronic diastolic CHF, admitted to [facility] on 11/06/15 with new onset dyspnea, hypoxia and suspected aspiration. She was admitted to stepdown unit for aspiration pneumonia, decompensated diastolic CHF and acute respiratory failure with hypoxia. She was seen in consultation with cardiolology and pulmonary.  She is now doing better off Bipap.  Palliative care consultation was requested.   Rewiew of Systems:  Constitutional: Negative for malaise, fever and chills. No significant weight loss or weight gain Eyes: Negative for eye pain, redness and discharge, diplopia, visual changes, or flashes of light. ENMT: Negative for ear pain, hoarseness, nasal congestion, sinus pressure and sore throat. No headaches; tinnitus, drooling, or problem swallowing. Cardiovascular: Negative for chest pain, palpitations, diaphoresis, dyspnea and peripheral edema. ; No orthopnea, PND Respiratory: Negative for cough, hemoptysis, wheezing and stridor. No pleuritic chestpain. Gastrointestinal: Negative for nausea, vomiting, diarrhea, constipation, abdominal pain, melena, blood in stool, hematemesis, jaundice and rectal bleeding.    Genitourinary: Negative for frequency, dysuria, incontinence,flank pain and  hematuria; Musculoskeletal: Negative for back pain and neck pain. Negative for swelling and trauma.;  Skin: . Negative for pruritus, rash, abrasions, bruising and skin lesion.; ulcerations Neuro: Negative for headache, lightheadedness and neck stiffness. Negative for weakness, altered level of consciousness , altered mental status, extremity weakness, burning feet, involuntary movement, seizure and syncope.  Psych: negative for anxiety, depression, insomnia, tearfulness, panic attacks, hallucinations, paranoia, suicidal or homicidal ideation    Past Medical History  Diagnosis Date  . Essential hypertension, benign   . Type 2 diabetes mellitus ([place])   . Osteoporosis 06/2008  . Stroke [facility]) 03/2015    right hemiparesis  . History of parotid cancer     Past Surgical History  Procedure Laterality Date  . Ankle surgery Left 2004    otif  . Abdominal hysterectomy    . Anterior and posterior repair N/A 03/11/2013    Procedure: ANTERIOR (CYSTOCELE) AND POSTERIOR REPAIR (RECTOCELE);  Surgeon: Linda Hedges, DO;  Location: [place] [facility];  Service: Gynecology;  Laterality: N/A;  with sacrospinous ligament fixation bilateral  . Colonoscopy  2009    negative  . Cataract extraction w/phaco Right 12/27/2014    Procedure: CATARACT EXTRACTION PHACO AND INTRAOCULAR LENS PLACEMENT RIGHT EYE;  Surgeon: Tonny Branch, MD;  Location: [facility];  Service: Ophthalmology;  Laterality: Right;  CDE:10.63  . Cataract extraction w/phaco Left 01/13/2015    Procedure: CATARACT EXTRACTION PHACO AND INTRAOCULAR LENS PLACEMENT LEFT EYE;  Surgeon: Tonny Branch, MD;  Location: [facility];  Service: Ophthalmology;  Laterality: Left;  CDE:18.87  . Cholecystectomy    . Tee without cardioversion N/A 04/01/2015    Procedure: TRANSESOPHAGEAL ECHOCARDIOGRAM (TEE);  Surgeon: Sanda Klein, MD;  Location: [facility] ENDOSCOPY;  Service: Cardiovascular;  Laterality: N/A;  . Peg placement    . Parotidectomy      Medications:  HOME MEDS: Prior to  Admission medications   Medication Sig Start Date End Date Taking? Authorizing Provider  aspirin EC 81 MG EC tablet Take 1 tablet (81 mg total) by mouth daily. 11/02/15  Yes Estela Leonie Green, MD  clotrimazole-betamethasone (LOTRISONE) cream Apply 1 application topically daily as needed (APPLY A THIN LAYER AS BARRIER AFTER CLEANING AND DRYING BUTTOCKS AREA COMPLETELY).   Yes Historical Provider, MD  FLUoxetine (PROZAC) 10 MG capsule TAKE 1 CAPSULE BY MOUTH DAILY FOR DEPRESSION/ANXIETY Patient taking differently: TAKE 1 CAPSULE VIA PEG DAILY FOR DEPRESSION/ANXIETY 09/15/15  Yes [name], MD  furosemide (LASIX) 20 MG tablet 20 mg by PEG Tube route daily.   Yes Historical Provider, MD  levETIRAcetam (KEPPRA) 750 MG tablet TAKE 1 TABLET BY MOUTH EVERY MORNING AND 2 TABLETS BY MOUTH EVERY NIGHT AT BEDTIME Patient taking differently: TAKE 1 TABLET VIA PEG TUBE IN THE MORNING & 2 TABLETS VIA PEG TUBE EVERY NIGHT AT BEDTIME 08/01/15  Yes [name], MD  loperamide (IMODIUM) 2 MG capsule Take 1 capsule (2 mg total) by mouth every 4 (four) hours as needed for diarrhea or loose stools. 10/21/15  Yes [name], MD  metFORMIN (GLUCOPHAGE) 500 MG tablet Take one half tablet BID Patient taking differently: 250 mg by PEG Tube route 2 (two) times daily with a meal.  04/25/15  Yes [name], MD  methylphenidate (RITALIN) 5 MG tablet Give one tablet per tube twice daily with breakfast and lunch 11/03/15  Yes [name], DO  metoCLOPramide (REGLAN) 10 MG tablet Place 1 tablet (10 mg total) into feeding tube every 6 (six) hours as needed for nausea. 10/21/15  Yes [name], MD  metoprolol tartrate (LOPRESSOR) 25 MG tablet Take 25 mg by mouth 2 (two) times daily.   Yes Historical Provider, MD  Nutritional Supplements (FEEDING SUPPLEMENT, VITAL AF 1.2 CAL,) LIQD Place 1,000 mLs into feeding tube daily. 10/21/15  Yes [name], MD  nystatin (MYCOSTATIN/NYSTOP) 100000 UNIT/GM POWD Apply  topically 3 (three) times daily. **APPLY A DUSTING TO RASH ON PERINEUM AND BUTTOCKS AT [facility] SHIFT   Yes Historical Provider, MD  ondansetron (ZOFRAN ODT) 4 MG disintegrating tablet Take 1 tablet (4 mg total) by mouth every 8 (eight) hours as needed for nausea or vomiting. Patient taking differently: 4 mg by PEG Tube route every 8 (eight) hours as needed for nausea or vomiting.  10/08/15  Yes [name], MD  pravastatin (PRAVACHOL) 40 MG tablet Take 1 tablet (40 mg total) by mouth daily. Patient taking differently: 40 mg by PEG Tube route every evening.  07/20/15  Yes [name], MD  warfarin (COUMADIN) 4 MG tablet Take 4 mg by mouth every evening.   Yes Historical Provider, MD  Water For Irrigation, Sterile (FREE WATER) SOLN Place 200 mLs into feeding tube every 8 (eight) hours. 10/21/15   [name], MD     Allergies:  No Known Allergies  Social History:   reports that she quit smoking about 8 months ago. Her smoking use included Cigarettes. She has a 7.5 pack-year smoking history. She does not have any smokeless tobacco history on file. She reports that she does not drink alcohol or use illicit drugs.  Family History: Family History  Problem Relation Age of Onset  . Cancer Father     Liver  . Cancer Sister     Breast  . Diabetes Brother   . Dementia Mother      Physical Exam: Filed Vitals:   11/09/15 0600  11/09/15 0700 11/09/15 0731 11/09/15 0800  BP: 144/74 134/65  127/78  Pulse: 93     Temp:      TempSrc:      Resp: 20 14  20   Height:      Weight:      SpO2: 99%  99%    Blood pressure 127/78, pulse 93, temperature 97.8 F (36.6 C), temperature source Oral, resp. rate 20, height 5\' 6"  (1.676 m), weight 41.2 kg (90 lb 13.3 oz), SpO2 99 %.  GEN:  Pleasant patient lying in the stretcher in no acute distress; cooperative with exam. PSYCH:  alert and oriented x4; does not appear anxious or depressed; affect is appropriate. HEENT: Mucous membranes pink and anicteric;  PERRLA; EOM intact; no cervical lymphadenopathy nor thyromegaly or carotid bruit; no JVD; There were no stridor. Neck is very supple. Breasts:: Not examined CHEST WALL: No tenderness CHEST: Normal respiration, clear to auscultation bilaterally.  HEART: Regular rate and rhythm.  There are no murmur, rub, or gallops.   BACK: No kyphosis or scoliosis; no CVA tenderness ABDOMEN: soft and non-tender; no masses, no organomegaly, normal abdominal bowel sounds; no pannus; no intertriginous candida. There is no rebound and no distention. Rectal Exam: Not done EXTREMITIES: No bone or joint deformity; age-appropriate arthropathy of the hands and knees; no edema; no ulcerations.  There is no calf tenderness. Genitalia: not examined PULSES: 2+ and symmetric SKIN: Normal hydration no rash or ulceration CNS: Cranial nerves 2-12 grossly intact no focal lateralizing neurologic deficit.  Speech is fluent; uvula elevated with phonation, facial symmetry and tongue midline. DTR are normal bilaterally, cerebella exam is intact, barbinski is negative and strengths are equaled bilaterally.  No sensory loss.   Labs on Admission:  Basic Metabolic Panel:  Recent Labs Lab 11/04/15 0745 11/06/15 0503 11/07/15 0416 11/08/15 0602 11/08/15 1700 11/09/15 0428  NA 139 141 139 139  --  145  K 4.2 4.4 4.0 3.1*  --  5.3*  CL 104 105 103 103  --  108  CO2 29 26 28 29   --  28  GLUCOSE 150* 262* 144* 186*  --  101*  BUN 31* 28* 31* 24*  --  28*  CREATININE 0.51 0.54 0.66 0.61  --  0.83  CALCIUM 8.3* 8.8* 8.3* 8.1*  --  8.8*  MG  --  2.2  --   --  2.2 2.1   Liver Function Tests:  Recent Labs Lab 11/04/15 0745  AST 17  ALT 17  ALKPHOS 42  BILITOT 0.3  PROT 5.1*  ALBUMIN 2.0*   CBC:  Recent Labs Lab 11/04/15 0745 11/06/15 0503 11/07/15 0416 11/08/15 0602 11/09/15 0428  WBC 8.6 11.5* 9.2 9.0 10.4  NEUTROABS  --  7.1  --   --   --   HGB 8.0* 8.5* 7.5* 7.7* 8.4*  HCT 27.0* 27.9* 25.1* 24.5* 27.6*  MCV  85.4 83.3 83.9 81.7 82.6  PLT 593* 689* 572* 554* 627*   Cardiac Enzymes:  Recent Labs Lab 11/06/15 0503 11/08/15 1700 11/08/15 2223 11/09/15 0428  TROPONINI 0.03 0.04* 0.03 0.03    CBG:  Recent Labs Lab 11/08/15 1634 11/08/15 2029 11/08/15 2331 11/09/15 0401 11/09/15 0726  GLUCAP 116* 163* 136* 100* 135*     Radiological Exams on Admission: Dg Chest Port 1 View  11/09/2015  CLINICAL DATA:  Acute respiratory failure with hypoxia. Aspiration pneumonia. Congestive heart failure. EXAM: PORTABLE CHEST 1 VIEW COMPARISON:  11/08/2015 FINDINGS: Cardiomegaly stable. Pulmonary vascular congestion and  symmetric lower lung airspace disease shows no significant change allowing for ladder radiograph technique, most consistent with pulmonary edema. Probable layering bilateral pleural effusions again noted. IMPRESSION: No significant change in cardiomegaly, probable diffuse pulmonary edema, and bilateral pleural effusions, consistent with congestive heart failure. Electronically Signed   By: Earle Gell M.D.   On: 11/09/2015 07:44   Dg Chest Port 1 View  11/08/2015  CLINICAL DATA:  Hypoxia EXAM: PORTABLE CHEST - 1 VIEW COMPARISON:  11/08/2015 FINDINGS: Cardiac shadow remains enlarged. Bibasilar changes are again seen with pleural effusions. Mild vascular congestion is noted. No focal confluent infiltrate is seen. No bony abnormality is noted. Gastrostomy catheter is noted over the upper abdomen. IMPRESSION: Stable appearance of the chest when compare with the prior exam consistent with mild CHF. Electronically Signed   By: Inez Catalina M.D.   On: 11/08/2015 14:41   Dg Chest Port 1 View  11/08/2015  CLINICAL DATA:  Pneumonia, cough EXAM: PORTABLE CHEST 1 VIEW COMPARISON:  11/07/2015 FINDINGS: Cardiomegaly with vascular congestion. Bilateral layering effusions and lower lobe opacities, likely edema. Findings are similar to prior study. No acute bony abnormality. IMPRESSION: Suspect mild CHF with  bilateral lower lobe opacities, likely edema and layering effusions. No real change. Electronically Signed   By: Rolm Baptise M.D.   On: 11/08/2015 10:09   Assessment/Plan Present on Admission:  . (Resolved) Respiratory failure ([place]) . Acute on chronic diastolic CHF (congestive heart failure) ([place]) . Acute respiratory failure with hypoxia ([place]) . 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 Aspiration pneumonia ([place]) . Dyslipidemia . Essential hypertension, benign . Malnutrition of moderate degree  PLAN: Aspiration pneumonia, tube feeds/gastric contents, recurrent - As per family, patient apparently found supine in bed at SNF which would contribute to aspiration. Patient to be nursed head up while on tube feeds. - Treating empirically with IV vancomycin and Zosyn - Check Procalcitonin <0.1 - Improving. No fevers. Leukocytosis resolved.  - DC vancomycin. D/c Zosyn and give Augmentin via PEG tube  - Blood cultures 2 negative to date.   Acute on chronic diastolic CHF - Changed Lasix to 40 mg IV every 12 hourly on admission. - BNP on admission: 3854 - clinically improved but intermittently mildly tachypnea. Not much peripheral edema but repeated chest x-ray today suggests persistent mild CHF. - Changed and gave Lasix 60 mg IV 1. Follow BNP and clinically in a.m.  Acute respiratory failure with hypoxia - Secondary to aspiration pneumonia and decompensated CHF. - Presented with oxygen saturations in the mid 80s. Transiently placed on BiPAP. - hypoxia resolved.  Will d/c IV Steroids.   Hypokalemia - Secondary to diuresis. Replace aggressively and follow BMP in a.m.  Essential hypertension - Controlled. Resumed home dose of metoprolol.  Type II DM - Sliding-scale insulin. Controlled.  Chronic atrial fibrillation - Continue Coumadin per pharmacy and metoprolol. INR 11/21: 3.38.  Dysphagia status post PEG tube - Resumed tube feeds. Dietitian consultation for tube feed  management.  - Speech therapy evaluation appreciated. Recommend dysphagia 1 diet and honey thickened liquids.  Urinary retention - Has had chronic indwelling Foley catheter for the last 2 months. Last changed on 11/19 at SNF. Outpatient urology consultation.  CVA with residual right hemiparesis - Continue Coumadin anticoagulation. PT and OT evaluation  Possible seizure disorder - Continue Keppra  Chronic anemia - Hemoglobin has dropped from 8.5 on 11/20 to 7.5 on 11/21. Hemoglobin has been fluctuating in the 7. 5-8 range over the last several days. No reported  bleeding. Follow CBC in a.m. Transfuse PRBCs if hemoglobin less than 7 g per DL. - Hemoglobin stable in the mid 7 g per DL range.   Left parotidectomy for malignant tumor  Thrombocytosis, possibly reactive - Improving.  Prolonged QTC - Repeat EKG: QTC 5:15 milliseconds. Magnesium: 2.2. Minimize QT prolonging medications. -Unclear etiology. Replace potassium today. Continue monitoring on telemetry. Follow EKG in a.m.   Recently treated Escherichia coli UTI  Adult failure to thrive  - Had extensive discussion with patient's spouse at bedside on 11/22. Patient has had a complicated  course since her surgery early October 2016. Recommended palliative care consultation for goals of care which they were agreeable to.    Other plans as per orders.  Code Status: Partial (  no code blue, no intubation, no CPR.  Cardiac Meds and Seward Meth, MD. [facility] Pager [phone] 7pm to 7am.  11/09/2015, 9:06 AM

## 2015-11-09 NOTE — Progress Notes (Signed)
OT Cancellation Note  Patient Details Name: Laura Melendez MRN: [id number] DOB: [birth year]   Cancelled Treatment:      Reason Eval/Treat Not Completed: Medical issues which prohibited therapy. Pt was transferred to the ICU yesterday.Current OT orders will be d/c. Will need new orders when pt is appropriate to resume OT services.   Guadelupe Sabin, OTR/L  [phone]  11/09/2015, 7:58 AM

## 2015-11-09 NOTE — Progress Notes (Signed)
Daily Progress Note   Patient Name: Laura Melendez       Date: 11/09/2015 DOB: 1941/05/31  Age: 74 y.o. MRN#: [id number] Attending Physician: Orvan Falconer, MD Primary Care Physician: [name], MD Admit Date: 11/06/2015  Reason for Consultation/Follow-up: Establishing goals of care and Psychosocial/spiritual support  Subjective: Mrs. Rosica is no longer on BiPAP and is able to talk with me today.  She still becomes SOB after a few words.  We briefly talk about her current health issues, and my meeting with her family last night.  She states, "I have a wonderful family".  I also share that her trusted PCP, Dr. [name], has been here to see her.  I share her family's goal that she be able to return to home, and my worries about her health and ability. We talk about her going back to [facility] for more rehab.  Mrs. Cullinane asks me, "Am I going to die".  I pause and share that we are all appointed to pass, but no one know for sure when.  I share that during that time,  she will have her pain, anxiety, breathing controlled,  and she will be surrounded by people who love her.  She seems accepting of this.  I also share that she has the right to say what that time will look like and encourage her to tell her family what she does and does not want.  She holds my hand the entire time we speak.    Length of Stay: 3 days  Current Medications: Scheduled Meds:  . amoxicillin-clavulanate  1 tablet Per Tube Q12H  . antiseptic oral rinse  7 mL Mouth Rinse q12n4p  . aspirin  81 mg Per Tube Daily  . chlorhexidine  15 mL Mouth Rinse BID  . free water  200 mL Per Tube 3 times per day  . furosemide  60 mg Intravenous BID  . insulin aspart  0-9 Units Subcutaneous 6 times per day  . ipratropium-albuterol  3 mL Nebulization Q6H  . levETIRAcetam  1,500 mg Per Tube QHS  . levETIRAcetam  750 mg Per Tube Daily  . methylphenidate  5 mg Per Tube BID WC  . metoprolol tartrate  25 mg Per Tube BID  . pravastatin  40 mg  Per Tube QPM  . sodium chloride  3 mL Intravenous Q12H  . warfarin  2 mg Oral Once  . Warfarin - Pharmacist Dosing Inpatient   Does not apply q1800    Continuous Infusions: . feeding supplement (VITAL AF 1.2 CAL)      PRN Meds: acetaminophen **OR** acetaminophen, albuterol, guaiFENesin-dextromethorphan  Palliative Performance Scale: 30%     Vital Signs: BP 127/78 mmHg  Pulse 93  Temp(Src) 97.8 F (36.6 C) (Oral)  Resp 20  Ht 5\' 6"  (1.676 m)  Wt 41.2 kg (90 lb 13.3 oz)  BMI 14.67 kg/m2  SpO2 99% SpO2: SpO2: 99 % O2 Device: O2 Device: Nasal Cannula O2 Flow Rate: O2 Flow Rate (L/min): 3 L/min  Intake/output summary:  Intake/Output Summary (Last 24 hours) at 11/09/15 1332 Last data filed at 11/09/15 0537  Gross per 24 hour  Intake    150 ml  Output   1900 ml  Net  -1750 ml   LBM:   Baseline Weight: Weight: 42.893 kg (94 lb 9 oz) Most recent weight: Weight: 41.2 kg (90 lb 13.3 oz)  Physical Exam: Constitutional: frail, chronically ill appearing.  Resp: even, SOB at times.  GI: abd soft,  PEG tube  Psych: subdued.               Additional Data Reviewed: Recent Labs     11/08/15  0602  11/09/15  0428  WBC  9.0  10.4  HGB  7.7*  8.4*  PLT  554*  627*  NA  139  145  BUN  24*  28*  CREATININE  0.61  0.83     Problem List:  Patient Active Problem List   Diagnosis Date Noted  . Palliative care encounter   . DNR (do not resuscitate) discussion   . Acute on chronic diastolic (congestive) heart failure ([place])   . Acute on chronic diastolic CHF (congestive heart failure) ([place]) 10/29/2015  . Malnutrition of moderate degree 10/19/2015  . Aspiration pneumonia ([place]) 10/17/2015  . Sepsis ([place]) 10/17/2015  . Acute respiratory failure with hypoxia ([place]) 10/17/2015  . 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 Right hemiparesis ([place]) 04/04/2015  . Embolic stroke involving left  middle cerebral artery ([place])   . 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 DM (diabetes mellitus) ([place]) 03/23/2014  . Osteoporosis, unspecified 03/23/2014  . Dyslipidemia 03/23/2014  . Tobacco abuse 03/23/2014  . Syncope 01/01/2014  . Essential hypertension, benign 01/01/2014     Palliative Care Assessment & Plan    Code Status:  Limited code  Goals of Care:  Return to [facility] for rehab, with goal for return to home as possible.   Symptom Management:  None at this time   Palliative Prophylaxis:  None at this time  Psycho-social/Spiritual:  Desire for further Chaplaincy support:no   Prognosis: Unable to determine Discharge Planning: [place] for rehab with Palliative care service follow-up   Care plan was discussed with nursing staff, CM, SW, and Dr. Marin Comment on next rounds.   Thank you for allowing the Palliative Medicine Team to assist in the care of this patient.   Time In:  0900 Time Out: 0920 Total Time 20 minutes Prolonged Time Billed  no     Greater than 50%  of this time was spent counseling and coordinating care related to the above assessment and plan.   Drue Novel, NP  11/09/2015, 1:32 PM  Please contact Palliative Medicine Team phone at [phone] for questions and concerns.

## 2015-11-09 NOTE — Progress Notes (Signed)
Nutrition Follow-Up  INTERVENTION:  Vital 1.2 ml/hr @ goal rate 40 ml/hr. Provides 1152 kcal, 72 gr protein and 768 ml water. Meets 95% energy and 100% protein needs.  Free water -200 ml TID and 30 ml water before and after medications.     NUTRITION DIAGNOSIS:  Predicted suboptimal oral nutrient intake related to swallow difficulty: ongoing. Pt PEG providing 100% of nutriton.  GOAL: Provide nutrition needs based on ASPEN/SCCM guidelines; met    MONITOR: TF tolerance, Labs, Weight trends, Skin   REASON FOR ASSESSMENT: Enteral feeding/NPO    ASSESSMENT: Pt has moved to ICU due to respiratory distress, mild CHF per MD. She is off BiPAP and on 2L of O2.  Evaluated by ST on 11/22. NPO at this time due to change in status.  Enteral feeding- continuous Vital 1.2 @ 40 ml/hr and free water flushes TID. No abdominal pain per pt.  Weight loss continues- diuretics being adjusted  Abnormal Labs: Potassium 5.3 (high), BUN-28, glucose 101 mg/dl.  BNP->4500   Diet Order:  Diet NPO time specified  Skin:   surgical wound to neck- healing  Last BM:  11/23 still having loose stool (green) 1 x today  Height:   Ht Readings from Last 1 Encounters:  11/06/15 _0  (1.676 m)    Weight:   Wt Readings from Last 1 Encounters:  11/09/15 90 lb 13.3 oz (41.2 kg)    Ideal Body Weight:   59 kg  BMI:  Body mass index is 14.67 kg/(m^2). underweight  Re-Estimated Nutritional Needs:   Kcal:  1176  Protein:   70 gr  Fluid:   1.3 liters daily  EDUCATION NEEDS: none at this time     Colman Cater MS,RD,CSG,LDN Office: #[phone] Pager: [phone]

## 2015-11-09 NOTE — Progress Notes (Signed)
Subjective: She looks substantially better than when I saw her yesterday. She is off BiPAP and on nasal cannula with good oxygen level. She still has tachypnea. She is able to speak to me without having to stop in the middle of a sentence to breathe now.   Objective: Vital signs in last 24 hours: Temp:  [97.7 F (36.5 C)-99.1 F (37.3 C)] 97.8 F (36.6 C) (11/23 0400) Pulse Rate:  [69-101] 95 (11/23 0100) Resp:  [17-28] 21 (11/23 0500) BP: (109-149)/(53-119) 135/70 mmHg (11/23 0500) SpO2:  [93 %-100 %] 95 % (11/23 0247) Weight:  [41.2 kg (90 lb 13.3 oz)] 41.2 kg (90 lb 13.3 oz) (11/23 0500) Weight change: -1.9 kg (-4 lb 3 oz) Last BM Date: 11/08/15  Intake/Output from previous day: 11/22 0701 - 11/23 0700 In: 100 [IV Piggyback:100] Out: 1900 [Urine:1900]  PHYSICAL EXAM General appearance: alert, cooperative and mild distress Resp: rales bilaterally and rhonchi bilaterally Cardio: regular rate and rhythm, S1, S2 normal, no murmur, click, rub or gallop GI: soft, non-tender; bowel sounds normal; no masses,  no organomegaly Extremities: extremities normal, atraumatic, no cyanosis or edema  Lab Results:  Results for orders placed or performed during the hospital encounter of 11/06/15 (from the past 48 hour(s))  Glucose, capillary     Status: Abnormal   Collection Time: 11/07/15  2:57 PM  Result Value Ref Range   Glucose-Capillary 142 (H) 65 - 99 mg/dL  Glucose, capillary     Status: Abnormal   Collection Time: 11/07/15  8:49 PM  Result Value Ref Range   Glucose-Capillary 121 (H) 65 - 99 mg/dL   Comment 1 Notify RN    Comment 2 Document in Chart   Glucose, capillary     Status: Abnormal   Collection Time: 11/08/15 12:33 AM  Result Value Ref Range   Glucose-Capillary 149 (H) 65 - 99 mg/dL   Comment 1 Document in Chart   Glucose, capillary     Status: Abnormal   Collection Time: 11/08/15  3:53 AM  Result Value Ref Range   Glucose-Capillary 111 (H) 65 - 99 mg/dL  Protime-INR      Status: Abnormal   Collection Time: 11/08/15  6:02 AM  Result Value Ref Range   Prothrombin Time 33.5 (H) 11.6 - 15.2 seconds   INR 3.38 (H) 0.00 - 1.49  CBC     Status: Abnormal   Collection Time: 11/08/15  6:02 AM  Result Value Ref Range   WBC 9.0 4.0 - 10.5 K/uL   RBC 3.00 (L) 3.87 - 5.11 MIL/uL   Hemoglobin 7.7 (L) 12.0 - 15.0 g/dL   HCT 24.5 (L) 36.0 - 46.0 %   MCV 81.7 78.0 - 100.0 fL   MCH 25.7 (L) 26.0 - 34.0 pg   MCHC 31.4 30.0 - 36.0 g/dL   RDW 16.7 (H) 11.5 - 15.5 %   Platelets 554 (H) 150 - 400 K/uL  Basic metabolic panel     Status: Abnormal   Collection Time: 11/08/15  6:02 AM  Result Value Ref Range   Sodium 139 135 - 145 mmol/L   Potassium 3.1 (L) 3.5 - 5.1 mmol/L    Comment: DELTA CHECK NOTED   Chloride 103 101 - 111 mmol/L   CO2 29 22 - 32 mmol/L   Glucose, Bld 186 (H) 65 - 99 mg/dL   BUN 24 (H) 6 - 20 mg/dL   Creatinine, Ser 0.61 0.44 - 1.00 mg/dL   Calcium 8.1 (L) 8.9 - 10.3 mg/dL  GFR calc non Af Amer >60 >60 mL/min   GFR calc Af Amer >60 >60 mL/min    Comment: (NOTE) The eGFR has been calculated using the CKD EPI equation. This calculation has not been validated in all clinical situations. eGFR's persistently <60 mL/min signify possible Chronic Kidney Disease.    Anion gap 7 5 - 15  Glucose, capillary     Status: Abnormal   Collection Time: 11/08/15  7:40 AM  Result Value Ref Range   Glucose-Capillary 176 (H) 65 - 99 mg/dL   Comment 1 Document in Chart   Glucose, capillary     Status: Abnormal   Collection Time: 11/08/15 11:22 AM  Result Value Ref Range   Glucose-Capillary 117 (H) 65 - 99 mg/dL   Comment 1 Document in Chart   Blood gas, arterial     Status: Abnormal   Collection Time: 11/08/15  4:20 PM  Result Value Ref Range   FIO2 40.00    Delivery systems BILEVEL POSITIVE AIRWAY PRESSURE    Mode BILEVEL POSITIVE AIRWAY PRESSURE    Inspiratory PAP 16    Expiratory PAP 8    pH, Arterial 7.471 (H) 7.350 - 7.450   pCO2 arterial 38.4  35.0 - 45.0 mmHg   pO2, Arterial 127.00 (H) 80.0 - 100.0 mmHg   Bicarbonate 28.2 (H) 20.0 - 24.0 mEq/L   Acid-Base Excess 4.0 (H) 0.0 - 2.0 mmol/L   O2 Saturation 98.6 %   Collection site RIGHT BRACHIAL    Drawn by [phone]    Sample type ARTERIAL   Glucose, capillary     Status: Abnormal   Collection Time: 11/08/15  4:34 PM  Result Value Ref Range   Glucose-Capillary 116 (H) 65 - 99 mg/dL   Comment 1 Notify RN    Comment 2 Document in Chart   Troponin I (q 6hr x 3)     Status: Abnormal   Collection Time: 11/08/15  5:00 PM  Result Value Ref Range   Troponin I 0.04 (H) <0.031 ng/mL    Comment:        PERSISTENTLY INCREASED TROPONIN VALUES IN THE RANGE OF 0.04-0.49 ng/mL CAN BE SEEN IN:       -UNSTABLE ANGINA       -CONGESTIVE HEART FAILURE       -MYOCARDITIS       -CHEST TRAUMA       -ARRYHTHMIAS       -LATE PRESENTING MYOCARDIAL INFARCTION       -COPD   CLINICAL FOLLOW-UP RECOMMENDED.   Magnesium     Status: None   Collection Time: 11/08/15  5:00 PM  Result Value Ref Range   Magnesium 2.2 1.7 - 2.4 mg/dL  Glucose, capillary     Status: Abnormal   Collection Time: 11/08/15  8:29 PM  Result Value Ref Range   Glucose-Capillary 163 (H) 65 - 99 mg/dL   Comment 1 Notify RN   Troponin I (q 6hr x 3)     Status: None   Collection Time: 11/08/15 10:23 PM  Result Value Ref Range   Troponin I 0.03 <0.031 ng/mL    Comment:        NO INDICATION OF MYOCARDIAL INJURY.   Glucose, capillary     Status: Abnormal   Collection Time: 11/08/15 11:31 PM  Result Value Ref Range   Glucose-Capillary 136 (H) 65 - 99 mg/dL  Blood gas, arterial     Status: Abnormal   Collection Time: 11/09/15  3:27 AM  Result  Value Ref Range   FIO2 0.21    pH, Arterial 7.495 (H) 7.350 - 7.450   pCO2 arterial 34.1 (L) 35.0 - 45.0 mmHg   pO2, Arterial 56.9 (L) 80.0 - 100.0 mmHg   Bicarbonate 27.1 (H) 20.0 - 24.0 mEq/L   Acid-Base Excess 2.8 (H) 0.0 - 2.0 mmol/L   O2 Saturation 87.6 %   Patient temperature  37.0    Collection site LEFT RADIAL    Drawn by [id number]    Sample type ARTERIAL DRAW    Allens test (pass/fail) PASS PASS  Glucose, capillary     Status: Abnormal   Collection Time: 11/09/15  4:01 AM  Result Value Ref Range   Glucose-Capillary 100 (H) 65 - 99 mg/dL   Comment 1 Notify RN   Troponin I (q 6hr x 3)     Status: None   Collection Time: 11/09/15  4:28 AM  Result Value Ref Range   Troponin I 0.03 <0.031 ng/mL    Comment:        NO INDICATION OF MYOCARDIAL INJURY.   Protime-INR     Status: Abnormal   Collection Time: 11/09/15  4:28 AM  Result Value Ref Range   Prothrombin Time 27.3 (H) 11.6 - 15.2 seconds   INR 2.58 (H) 0.00 - 1.49  Procalcitonin     Status: None   Collection Time: 11/09/15  4:28 AM  Result Value Ref Range   Procalcitonin <0.10 ng/mL    Comment:        Interpretation: PCT (Procalcitonin) <= 0.5 ng/mL: Systemic infection (sepsis) is not likely. Local bacterial infection is possible. (NOTE)         ICU PCT Algorithm               Non ICU PCT Algorithm    ----------------------------     ------------------------------         PCT < 0.25 ng/mL                 PCT < 0.1 ng/mL     Stopping of antibiotics            Stopping of antibiotics       strongly encouraged.               strongly encouraged.    ----------------------------     ------------------------------       PCT level decrease by               PCT < 0.25 ng/mL       >= 80% from peak PCT       OR PCT 0.25 - 0.5 ng/mL          Stopping of antibiotics                                             encouraged.     Stopping of antibiotics           encouraged.    ----------------------------     ------------------------------       PCT level decrease by              PCT >= 0.25 ng/mL       < 80% from peak PCT        AND PCT >= 0.5 ng/mL            Continuin g  antibiotics                                              encouraged.       Continuing antibiotics            encouraged.     ----------------------------     ------------------------------     PCT level increase compared          PCT > 0.5 ng/mL         with peak PCT AND          PCT >= 0.5 ng/mL             Escalation of antibiotics                                          strongly encouraged.      Escalation of antibiotics        strongly encouraged.   CBC     Status: Abnormal   Collection Time: 11/09/15  4:28 AM  Result Value Ref Range   WBC 10.4 4.0 - 10.5 K/uL   RBC 3.34 (L) 3.87 - 5.11 MIL/uL   Hemoglobin 8.4 (L) 12.0 - 15.0 g/dL   HCT 27.6 (L) 36.0 - 46.0 %   MCV 82.6 78.0 - 100.0 fL   MCH 25.1 (L) 26.0 - 34.0 pg   MCHC 30.4 30.0 - 36.0 g/dL   RDW 17.0 (H) 11.5 - 15.5 %   Platelets 627 (H) 150 - 400 K/uL  Basic metabolic panel     Status: Abnormal   Collection Time: 11/09/15  4:28 AM  Result Value Ref Range   Sodium 145 135 - 145 mmol/L   Potassium 5.3 (H) 3.5 - 5.1 mmol/L    Comment: DELTA CHECK NOTED   Chloride 108 101 - 111 mmol/L   CO2 28 22 - 32 mmol/L   Glucose, Bld 101 (H) 65 - 99 mg/dL   BUN 28 (H) 6 - 20 mg/dL   Creatinine, Ser 0.83 0.44 - 1.00 mg/dL   Calcium 8.8 (L) 8.9 - 10.3 mg/dL   GFR calc non Af Amer >60 >60 mL/min   GFR calc Af Amer >60 >60 mL/min    Comment: (NOTE) The eGFR has been calculated using the CKD EPI equation. This calculation has not been validated in all clinical situations. eGFR's persistently <60 mL/min signify possible Chronic Kidney Disease.    Anion gap 9 5 - 15  Brain natriuretic peptide     Status: Abnormal   Collection Time: 11/09/15  4:28 AM  Result Value Ref Range   B Natriuretic Peptide >4500.0 (H) 0.0 - 100.0 pg/mL  Magnesium     Status: None   Collection Time: 11/09/15  4:28 AM  Result Value Ref Range   Magnesium 2.1 1.7 - 2.4 mg/dL  Glucose, capillary     Status: Abnormal   Collection Time: 11/09/15  7:26 AM  Result Value Ref Range   Glucose-Capillary 135 (H) 65 - 99 mg/dL    ABGS  Recent Labs  11/09/15 0327  PHART 7.495*   PO2ART 56.9*  HCO3 27.1*   CULTURES Recent Results (from the past 240 hour(s))  Culture, Urine     Status: None   Collection Time: 10/31/15 10:33 AM  Result Value Ref Range Status   Specimen Description URINE, CATHETERIZED  Final   Special Requests NONE  Final   Culture   Final    >=100,000 COLONIES/mL ESCHERICHIA COLI Performed at [facility]    Report Status 11/02/2015 FINAL  Final   Organism ID, Bacteria ESCHERICHIA COLI  Final      Susceptibility   Escherichia coli - MIC*    AMPICILLIN <=2 SENSITIVE Sensitive     CEFAZOLIN <=4 SENSITIVE Sensitive     CEFTRIAXONE <=1 SENSITIVE Sensitive     CIPROFLOXACIN <=0.25 SENSITIVE Sensitive     GENTAMICIN <=1 SENSITIVE Sensitive     IMIPENEM <=0.25 SENSITIVE Sensitive     NITROFURANTOIN <=16 SENSITIVE Sensitive     TRIMETH/SULFA <=20 SENSITIVE Sensitive     AMPICILLIN/SULBACTAM <=2 SENSITIVE Sensitive     PIP/TAZO <=4 SENSITIVE Sensitive     * >=100,000 COLONIES/mL ESCHERICHIA COLI  Urine culture     Status: None   Collection Time: 11/06/15  6:02 AM  Result Value Ref Range Status   Specimen Description URINE, CLEAN CATCH  Final   Special Requests NONE  Final   Culture   Final    60,000 COLONIES/ml YEAST Performed at [facility]    Report Status 11/08/2015 FINAL  Final  Culture, blood (routine x 2)     Status: None (Preliminary result)   Collection Time: 11/06/15  6:56 AM  Result Value Ref Range Status   Specimen Description BLOOD RIGHT HAND  Final   Special Requests BOTTLES DRAWN AEROBIC AND ANAEROBIC 4CC EACH  Final   Culture NO GROWTH 2 DAYS  Final   Report Status PENDING  Incomplete  Culture, blood (routine x 2)     Status: None (Preliminary result)   Collection Time: 11/06/15  7:04 AM  Result Value Ref Range Status   Specimen Description BLOOD LEFT HAND  Final   Special Requests BOTTLES DRAWN AEROBIC AND ANAEROBIC [place]  Final   Culture NO GROWTH 2 DAYS  Final   Report Status PENDING  Incomplete    Studies/Results: Dg Chest Port 1 View  11/09/2015  CLINICAL DATA:  Acute respiratory failure with hypoxia. Aspiration pneumonia. Congestive heart failure. EXAM: PORTABLE CHEST 1 VIEW COMPARISON:  11/08/2015 FINDINGS: Cardiomegaly stable. Pulmonary vascular congestion and symmetric lower lung airspace disease shows no significant change allowing for ladder radiograph technique, most consistent with pulmonary edema. Probable layering bilateral pleural effusions again noted. IMPRESSION: No significant change in cardiomegaly, probable diffuse pulmonary edema, and bilateral pleural effusions, consistent with congestive heart failure. Electronically Signed   By: Earle Gell M.D.   On: 11/09/2015 07:44   Dg Chest Port 1 View  11/08/2015  CLINICAL DATA:  Hypoxia EXAM: PORTABLE CHEST - 1 VIEW COMPARISON:  11/08/2015 FINDINGS: Cardiac shadow remains enlarged. Bibasilar changes are again seen with pleural effusions. Mild vascular congestion is noted. No focal confluent infiltrate is seen. No bony abnormality is noted. Gastrostomy catheter is noted over the upper abdomen. IMPRESSION: Stable appearance of the chest when compare with the prior exam consistent with mild CHF. Electronically Signed   By: Inez Catalina M.D.   On: 11/08/2015 14:41   Dg Chest Port 1 View  11/08/2015  CLINICAL DATA:  Pneumonia, cough EXAM: PORTABLE CHEST 1 VIEW COMPARISON:  11/07/2015 FINDINGS: Cardiomegaly with vascular congestion. Bilateral layering effusions and lower lobe opacities, likely edema. Findings are similar to prior study. No acute bony abnormality. IMPRESSION: Suspect mild CHF with bilateral lower lobe opacities, likely edema  and layering effusions. No real change. Electronically Signed   By: Rolm Baptise M.D.   On: 11/08/2015 10:09   Dg Chest Port 1 View  11/07/2015  CLINICAL DATA:  Aspiration pneumonia. EXAM: PORTABLE CHEST 1 VIEW COMPARISON:  11/06/2015. FINDINGS: Cardiomegaly with pulmonary vascular prominence and  interstitial prominence consistent with congestive heart failure. Bibasilar pneumonia cannot be excluded . Small bilateral effusions. No pneumothorax. IMPRESSION: Findings suggest congestive heart failure with bilateral pulmonary edema and small pleural effusions. Bibasilar pneumonia cannot be excluded. No significant change from prior exam. Electronically Signed   By: Marcello Moores  Register   On: 11/07/2015 08:31    Medications:  Prior to Admission:  Prescriptions prior to admission  Medication Sig Dispense Refill Last Dose  . aspirin EC 81 MG EC tablet Take 1 tablet (81 mg total) by mouth daily.   11/05/2015 at Unknown time  . clotrimazole-betamethasone (LOTRISONE) cream Apply 1 application topically daily as needed (APPLY A THIN LAYER AS BARRIER AFTER CLEANING AND DRYING BUTTOCKS AREA COMPLETELY).   UNKNOWN  . FLUoxetine (PROZAC) 10 MG capsule TAKE 1 CAPSULE BY MOUTH DAILY FOR DEPRESSION/ANXIETY (Patient taking differently: TAKE 1 CAPSULE VIA PEG DAILY FOR DEPRESSION/ANXIETY) 90 capsule 1 11/05/2015 at Unknown time  . furosemide (LASIX) 20 MG tablet 20 mg by PEG Tube route daily.   11/05/2015 at Unknown time  . levETIRAcetam (KEPPRA) 750 MG tablet TAKE 1 TABLET BY MOUTH EVERY MORNING AND 2 TABLETS BY MOUTH EVERY NIGHT AT BEDTIME (Patient taking differently: TAKE 1 TABLET VIA PEG TUBE IN THE MORNING & 2 TABLETS VIA PEG TUBE EVERY NIGHT AT BEDTIME) 270 tablet 3 11/05/2015 at Unknown time  . loperamide (IMODIUM) 2 MG capsule Take 1 capsule (2 mg total) by mouth every 4 (four) hours as needed for diarrhea or loose stools. 30 capsule 0 11/04/2015 at Unknown time  . metFORMIN (GLUCOPHAGE) 500 MG tablet Take one half tablet BID (Patient taking differently: 250 mg by PEG Tube route 2 (two) times daily with a meal. ) 30 tablet 5 11/05/2015 at Unknown time  . methylphenidate (RITALIN) 5 MG tablet Give one tablet per tube twice daily with breakfast and lunch 60 tablet 0 11/05/2015 at Unknown time  . metoCLOPramide  (REGLAN) 10 MG tablet Place 1 tablet (10 mg total) into feeding tube every 6 (six) hours as needed for nausea.   11/05/2015 at Unknown time  . metoprolol tartrate (LOPRESSOR) 25 MG tablet Take 25 mg by mouth 2 (two) times daily.   11/05/2015 at 2100  . Nutritional Supplements (FEEDING SUPPLEMENT, VITAL AF 1.2 CAL,) LIQD Place 1,000 mLs into feeding tube daily.   11/05/2015 at Unknown time  . nystatin (MYCOSTATIN/NYSTOP) 100000 UNIT/GM POWD Apply topically 3 (three) times daily. **APPLY A DUSTING TO RASH ON PERINEUM AND BUTTOCKS AT [facility] SHIFT   UNKNOWN  . ondansetron (ZOFRAN ODT) 4 MG disintegrating tablet Take 1 tablet (4 mg total) by mouth every 8 (eight) hours as needed for nausea or vomiting. (Patient taking differently: 4 mg by PEG Tube route every 8 (eight) hours as needed for nausea or vomiting. ) 20 tablet 0 11/05/2015 at Unknown time  . pravastatin (PRAVACHOL) 40 MG tablet Take 1 tablet (40 mg total) by mouth daily. (Patient taking differently: 40 mg by PEG Tube route every evening. ) 30 tablet 12 11/05/2015 at Unknown time  . warfarin (COUMADIN) 4 MG tablet Take 4 mg by mouth every evening.   11/05/2015 at 1700  . [DISCONTINUED] ciprofloxacin (CIPRO) 250 MG tablet Place 1  tablet (250 mg total) into feeding tube 2 (two) times daily. 6 tablet 0 11/05/2015 at Unknown time  . Water For Irrigation, Sterile (FREE WATER) SOLN Place 200 mLs into feeding tube every 8 (eight) hours.   10/29/2015 at Unknown time   Scheduled: . antiseptic oral rinse  7 mL Mouth Rinse q12n4p  . aspirin  81 mg Per Tube Daily  . chlorhexidine  15 mL Mouth Rinse BID  . free water  200 mL Per Tube 3 times per day  . insulin aspart  0-9 Units Subcutaneous 6 times per day  . ipratropium-albuterol  3 mL Nebulization Q6H  . levETIRAcetam  1,500 mg Per Tube QHS  . levETIRAcetam  750 mg Per Tube Daily  . methylphenidate  5 mg Per Tube BID WC  . methylPREDNISolone (SOLU-MEDROL) injection  40 mg Intravenous Q12H  . metoprolol  tartrate  25 mg Per Tube BID  . piperacillin-tazobactam (ZOSYN)  IV  3.375 g Intravenous 3 times per day  . pravastatin  40 mg Per Tube QPM  . sodium chloride  3 mL Intravenous Q12H  . Warfarin - Pharmacist Dosing Inpatient   Does not apply q1800   Continuous:  NUU:[id number] **OR** acetaminophen, albuterol, guaiFENesin-dextromethorphan  Assesment: She was admitted with aspiration pneumonia. She developed acute hypoxic respiratory failure yesterday requiring transfer and BiPAP. She is off the BiPAP now. She has what appears to be pulmonary edema still on chest x-ray and she still has a very high BNP. She has improved however. She is now able to be maintained on nasal oxygen at least for now. She has history of dysphagia from a stroke so she is high risk for further episodes of aspiration. 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 Continue diuresis. Continue IV antibiotics for aspiration pneumonia. Considering the findings on her chest x-ray and her BNP she is still pretty high risk for chemical deterioration as happened yesterday.    LOS: 3 days   [place] L 11/09/2015, 8:13 AM

## 2015-11-09 NOTE — Therapy (Signed)
[place], [place], [address] Phone: [phone]   Fax:  [phone]  Patient Details  Name: Laura Melendez MRN: [id number] Date of Birth: 03/10/1941 Referring Provider:  No ref. provider found  Encounter Date: 11/09/2015  PHYSICAL THERAPY DISCHARGE SUMMARY  Visits from Start of Care: 9  Current functional level related to goals / functional outcomes:  Pt was admitted to inpatient hospital, d/c from [place] - 09/14/15 [place] #1   Title Patient will be able to perform functional bed mobility tasks, including supine<->sit and rolling with distant supervision, occasional cues for safety/sequencing    Baseline 9/28- pt requires close supervision and cueing for supine to sit   Time 4   Period Weeks   Status On-going   PT SHORT TERM GOAL #2   Title Patient will be able to perform sit to stand from low surfaces with distance supervision and only occasional cues for safety/sequencing    Time 4   Period Weeks   Status On-going   PT SHORT TERM GOAL #3   Title Patient will be able to perform TUG in 13 seconds or less on a consistent basis    Baseline 9/28- TUG 18 seconds   Time 4   Period Weeks   Status On-going   PT SHORT TERM GOAL #4   Title Patient will be able to ambulate at least 552f during 3 minute walk test with only minimal unsteadiness and good balance self-correction strategies    Baseline 9/28- 300 ft   Time 4   Period Weeks   Status On-going   PT SHORT TERM GOAL #5   Title Patient and her spouse will be independent in correctly and consistently performing appropriate HEP, to be updated PRN    Time 4   Period Weeks   Status On-going           PT Long Term Goals - 09/14/15 1606    PT LONG TERM GOAL #1   Title Patient will demonstrate at least 4+/5 strength in bilateral  lower extremities and at least 3+/5 strength in proximal musculature    Time 8   Period Weeks   Status On-going   PT LONG TERM GOAL #2   Title Patient will demonstrate good safety awareness at least 80% of the time when navigating through obstacles and in functional scenarios, to be evidenced by performance of functional obstacle course    Time 8   Period Weeks   Status On-going   PT LONG TERM GOAL #3   Title Patient and her spouse will report no falls within the past 4 weeks, and patient willl also demonstrate a score of no more than 11 on TUG    Time 8   Period Weeks   Status On-going   PT LONG TERM GOAL #4   Title Patient will be able to ambulate at least 10052fduring 6 minute walk test with no rest breaks, minimal unsteadiness and good balance self-correction strategies    Time 8   Period Weeks   Status On-going   PT LONG TERM GOAL #5   Title Patient to be able to maintain SLS on foam pad for at least 20 seconds each lower extremity to evidence improved stability and reduced fall risk    Time 8   Period Weeks   Status On-going            Remaining deficits:  See above   Education / Equipment: N/A  Plan:                                                    Patient goals were not met. Patient is being discharged due to a change in medical status.  ?????      Hilma Favors, PT, DPT [phone] 11/09/2015, 2:37 PM  [place], [place], [address] Phone: [phone]   Fax:  [phone]

## 2015-11-09 NOTE — Progress Notes (Signed)
ANTICOAGULATION CONSULT NOTE   Pharmacy Consult forCoumadin Indication: atrial fibrillation  No Known Allergies  Patient Measurements: Height: 5\' 6"  (167.6 cm) Weight: 90 lb 13.3 oz (41.2 kg) IBW/kg (Calculated) : 59.3kg  Vital Signs: Temp: 97.8 F (36.6 C) (11/23 0400) Temp Source: Oral (11/23 0400) BP: 127/78 mmHg (11/23 0800) Pulse Rate: 93 (11/23 0600)  Labs:  Recent Labs  11/07/15 0416 11/08/15 0602 11/08/15 1700 11/08/15 2223 11/09/15 0428  HGB 7.5* 7.7*  --   --  8.4*  HCT 25.1* 24.5*  --   --  27.6*  PLT 572* 554*  --   --  627*  LABPROT 30.5* 33.5*  --   --  27.3*  INR 2.99* 3.38*  --   --  2.58*  CREATININE 0.66 0.61  --   --  0.83  TROPONINI  --   --  0.04* 0.03 0.03    Estimated Creatinine Clearance: 38.7 mL/min (by C-G formula based on Cr of 0.83).   Medical History: Past Medical History  Diagnosis Date  . Essential hypertension, benign   . Type 2 diabetes mellitus ([place])   . Osteoporosis 06/2008  . 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 History of parotid cancer     Medications:  See med rec  Assessment: Chronic afib on coumadin. INR 2.58 today  No bleeding reported.  MD notes uptrend in CBC suggest hemoconcentration.  Goal of Therapy:  INR 2-3 Monitor platelets by anticoagulation protocol: Yes   Plan:  Coumadin 2 mg po today Daily PT/INR Monitor for S/S of bleeding  Thanks for allowing pharmacy to be a part of this patient's care.  Excell Seltzer, PharmD Clinical Pharmacist 11/09/2015,11:14 AM

## 2015-11-09 NOTE — Care Management Important Message (Signed)
Important Message  Patient Details  Name: Laura Melendez MRN: [id number] Date of Birth: 03/11/1941   Medicare Important Message Given:  Yes    Sherald Barge, RN 11/09/2015, 12:46 PM

## 2015-11-09 NOTE — Clinical Social Work Note (Addendum)
CSW updated PNC on pt. Pt is improved. Possible d/c this weekend. Parks notified and agreeable and will start [facility] authorization.   Benay Pike, [place]

## 2015-11-09 NOTE — Progress Notes (Signed)
Subjective:   SOB improved from yesterday but not resolved, no chest pain.    Objective:   Temp:  [97.7 F (36.5 C)-99.1 F (37.3 C)] 97.8 F (36.6 C) (11/23 0400) Pulse Rate:  [69-101] 93 (11/23 0600) Resp:  [14-28] 20 (11/23 0800) BP: (109-149)/(53-119) 127/78 mmHg (11/23 0800) SpO2:  [93 %-100 %] 99 % (11/23 0731) Weight:  [90 lb 13.3 oz (41.2 kg)] 90 lb 13.3 oz (41.2 kg) (11/23 0500) Last BM Date: 11/08/15  Filed Weights   11/07/15 0400 11/08/15 0636 11/09/15 0500  Weight: 93 lb 0.6 oz (42.2 kg) 95 lb 0.3 oz (43.1 kg) 90 lb 13.3 oz (41.2 kg)    Intake/Output Summary (Last 24 hours) at 11/09/15 0839 Last data filed at 11/09/15 0537  Gross per 24 hour  Intake    150 ml  Output   1900 ml  Net  -1750 ml    Telemetry: SR and sinus tach  Exam:  General: NAD  Resp: CTAB  Cardiac: RRR, 2/6 systolic murmur LLSB, no jvd  [id number] soft, NT, ND  MSK: no LE edema  Neuro: no focal deficits  Psych: appropriate affect  Lab Results:  Basic Metabolic Panel:  Recent Labs Lab 11/06/15 0503 11/07/15 0416 11/08/15 0602 11/08/15 1700 11/09/15 0428  NA 141 139 139  --  145  K 4.4 4.0 3.1*  --  5.3*  CL 105 103 103  --  108  CO2 26 28 29   --  28  GLUCOSE 262* 144* 186*  --  101*  BUN 28* 31* 24*  --  28*  CREATININE 0.54 0.66 0.61  --  0.83  CALCIUM 8.8* 8.3* 8.1*  --  8.8*  MG 2.2  --   --  2.2 2.1    Liver Function Tests:  Recent Labs Lab 11/04/15 0745  AST 17  ALT 17  ALKPHOS 42  BILITOT 0.3  PROT 5.1*  ALBUMIN 2.0*    CBC:  Recent Labs Lab 11/07/15 0416 11/08/15 0602 11/09/15 0428  WBC 9.2 9.0 10.4  HGB 7.5* 7.7* 8.4*  HCT 25.1* 24.5* 27.6*  MCV 83.9 81.7 82.6  PLT 572* 554* 627*    Cardiac Enzymes:  Recent Labs Lab 11/08/15 1700 11/08/15 2223 11/09/15 0428  TROPONINI 0.04* 0.03 0.03    BNP: No results for input(s): PROBNP in the last 8760 hours.  Coagulation:  Recent Labs Lab 11/07/15 0416 11/08/15 0602  11/09/15 0428  INR 2.99* 3.38* 2.58*    ECG:   Medications:   Scheduled Medications: . antiseptic oral rinse  7 mL Mouth Rinse q12n4p  . aspirin  81 mg Per Tube Daily  . chlorhexidine  15 mL Mouth Rinse BID  . free water  200 mL Per Tube 3 times per day  . insulin aspart  0-9 Units Subcutaneous 6 times per day  . ipratropium-albuterol  3 mL Nebulization Q6H  . levETIRAcetam  1,500 mg Per Tube QHS  . levETIRAcetam  750 mg Per Tube Daily  . methylphenidate  5 mg Per Tube BID WC  . methylPREDNISolone (SOLU-MEDROL) injection  40 mg Intravenous Q12H  . metoprolol tartrate  25 mg Per Tube BID  . piperacillin-tazobactam (ZOSYN)  IV  3.375 g Intravenous 3 times per day  . pravastatin  40 mg Per Tube QPM  . sodium chloride  3 mL Intravenous Q12H  . Warfarin - Pharmacist Dosing Inpatient   Does not apply q1800     Infusions:     PRN  Medications:  acetaminophen **OR** acetaminophen, albuterol, guaiFENesin-dextromethorphan     Assessment/Plan   1. Dyspnea - patient with recurrent admissions with aspiration pneumonia, being followed by pulmonary - had some evidence of pulmonary edema on CXR on admission, she has been diuresing. She has history of chronic diasotlic HF.  - negative 1.7 liters yesterday, negative 2/4 liters since admission. Uptrend in Cr and BUN though still within normal values. Uptrend in her CBC cells lines suggests she is becoming hemoconcentrated, weight is down 4 pounds since admission, though CXR continues to show possible edema and her BNP remains significantly elevated.  Yesterday she received IV lasix 60mg  x 2 doses. Will dose her with 60mg  IV x 2 today and follow her clinically - f/u repeat echo  2. Abnormal EKG - EKG with deep inverted T-waves inferior and lateral precordial leads. Troponins negative, she has had no chest pain - will f/u echo results. She would be a poor candidate for any form of ischemic testing given her severe debilitated status - she  also had long QTc. Reglan and prozac discontinued, will repeat ekg today.   3. Palliative care - agree with palliative care involvement, from records patient has had significant decline over the last few months with reoccuring aspiration and various other progressing medical problems. - she had changed her code status to limited code   4. Afib - patient is therapeutic on coumadin      Carlyle Dolly, M.D.

## 2015-11-10 DIAGNOSIS — J9601 Acute respiratory failure with hypoxia: Secondary | ICD-10-CM

## 2015-11-10 DIAGNOSIS — E0811 Diabetes mellitus due to underlying condition with ketoacidosis with coma: Secondary | ICD-10-CM

## 2015-11-10 DIAGNOSIS — Z794 Long term (current) use of insulin: Secondary | ICD-10-CM

## 2015-11-10 LAB — GLUCOSE, CAPILLARY
GLUCOSE-CAPILLARY: 116 mg/dL — AB (ref 65–99)
GLUCOSE-CAPILLARY: 128 mg/dL — AB (ref 65–99)
GLUCOSE-CAPILLARY: 163 mg/dL — AB (ref 65–99)
Glucose-Capillary: 113 mg/dL — ABNORMAL HIGH (ref 65–99)
Glucose-Capillary: 186 mg/dL — ABNORMAL HIGH (ref 65–99)
Glucose-Capillary: 128 mg/dL — ABNORMAL HIGH (ref 65–99)

## 2015-11-10 LAB — PROTIME-INR
INR: 3.16 — ABNORMAL HIGH (ref 0.00–1.49)
PROTHROMBIN TIME: 31.8 s — AB (ref 11.6–15.2)

## 2015-11-10 NOTE — Progress Notes (Signed)
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 fib on Coumadin, osteoporosis, pneumothorax status post chest tube, HTN, chronic indwelling Foley catheter for the last 2 months secondary to urinary retention, multiple hospitalizations (4 in the last 2 months), recent admission to [facility] 10/29/15-11/02/15 for acute hypoxic respiratory failure due to acute on chronic diastolic CHF, admitted to [facility] on 11/06/15 with new onset dyspnea, hypoxia and suspected aspiration. She was admitted to stepdown unit for aspiration pneumonia, decompensated diastolic CHF and acute respiratory failure with hypoxia. She was seen in consultation with cardiolology and pulmonary. She is now doing better off Bipap. Palliative care consultation was requested, and Laura Melendez saw her.  Plan to proceed with SNF for rehab, and to continue with partial code status.   Rewiew of Systems: Unable.    Past Medical History  Diagnosis Date  . Essential hypertension, benign   . Type 2 diabetes mellitus ([place])   . Osteoporosis 06/2008  . Stroke [facility]) 03/2015    right hemiparesis  . History of parotid cancer     Past Surgical History  Procedure Laterality Date  . Ankle surgery Left 2004    otif  . Abdominal hysterectomy    . Anterior and posterior repair N/A 03/11/2013    Procedure: ANTERIOR (CYSTOCELE) AND POSTERIOR REPAIR (RECTOCELE);  Surgeon: Laura Hedges, DO;  Location: [facility];  Service: Gynecology;  Laterality: N/A;  with sacrospinous ligament fixation bilateral  . Colonoscopy  2009    negative  . Cataract extraction w/phaco Right 12/27/2014    Procedure:  CATARACT EXTRACTION PHACO AND INTRAOCULAR LENS PLACEMENT RIGHT EYE;  Surgeon: Laura Branch, MD;  Location: [facility];  Service: Ophthalmology;  Laterality: Right;  CDE:10.63  . Cataract extraction w/phaco Left 01/13/2015    Procedure: CATARACT EXTRACTION PHACO AND INTRAOCULAR LENS PLACEMENT LEFT EYE;  Surgeon: Laura Branch, MD;  Location: [facility];  Service: Ophthalmology;  Laterality: Left;  CDE:18.87  . Cholecystectomy    . Tee without cardioversion N/A 04/01/2015    Procedure: TRANSESOPHAGEAL ECHOCARDIOGRAM (TEE);  Surgeon: Laura Klein, MD;  Location: [facility] ENDOSCOPY;  Service: Cardiovascular;  Laterality: N/A;  . Peg placement    . Parotidectomy      Medications:  HOME MEDS: Prior to Admission medications   Medication Sig Start Date End Date Taking? Authorizing Provider  aspirin EC 81 MG EC tablet Take 1 tablet (81 mg total) by mouth daily. 11/02/15  Yes Laura Leonie Green, MD  clotrimazole-betamethasone (LOTRISONE) cream Apply 1 application topically daily as needed (APPLY A THIN LAYER AS BARRIER AFTER CLEANING AND DRYING BUTTOCKS AREA COMPLETELY).   Yes Historical Provider, MD  FLUoxetine (PROZAC) 10 MG capsule TAKE 1 CAPSULE BY MOUTH DAILY FOR DEPRESSION/ANXIETY Patient taking differently: TAKE 1 CAPSULE VIA PEG DAILY FOR DEPRESSION/ANXIETY 09/15/15  Yes Laura Drown, MD  furosemide (LASIX) 20 MG tablet 20 mg by PEG Tube route daily.   Yes Historical Provider, MD  levETIRAcetam (KEPPRA) 750 MG tablet TAKE 1 TABLET BY MOUTH EVERY MORNING AND 2 TABLETS BY MOUTH EVERY NIGHT AT BEDTIME Patient taking differently: TAKE 1 TABLET VIA PEG TUBE IN THE MORNING & 2 TABLETS VIA PEG  TUBE EVERY NIGHT AT BEDTIME 08/01/15  Yes Laura Pacas, MD  loperamide (IMODIUM) 2 MG capsule Take 1 capsule (2 mg total) by mouth every 4 (four) hours as needed for diarrhea or loose stools. 10/21/15  Yes Laura Dike, MD  metFORMIN (GLUCOPHAGE) 500 MG tablet Take one half tablet BID Patient taking differently: 250 mg by PEG  Tube route 2 (two) times daily with a meal.  04/25/15  Yes Laura Drown, MD  methylphenidate (RITALIN) 5 MG tablet Give one tablet per tube twice daily with breakfast and lunch 11/03/15  Yes Laura L Reed, DO  metoCLOPramide (REGLAN) 10 MG tablet Place 1 tablet (10 mg total) into feeding tube every 6 (six) hours as needed for nausea. 10/21/15  Yes Laura Dike, MD  metoprolol tartrate (LOPRESSOR) 25 MG tablet Take 25 mg by mouth 2 (two) times daily.   Yes Historical Provider, MD  Nutritional Supplements (FEEDING SUPPLEMENT, VITAL AF 1.2 CAL,) LIQD Place 1,000 mLs into feeding tube daily. 10/21/15  Yes Laura Dike, MD  nystatin (MYCOSTATIN/NYSTOP) 100000 UNIT/GM POWD Apply topically 3 (three) times daily. **APPLY A DUSTING TO RASH ON PERINEUM AND BUTTOCKS AT Laura Melendez SHIFT   Yes Historical Provider, MD  ondansetron (ZOFRAN ODT) 4 MG disintegrating tablet Take 1 tablet (4 mg total) by mouth every 8 (eight) hours as needed for nausea or vomiting. Patient taking differently: 4 mg by PEG Tube route every 8 (eight) hours as needed for nausea or vomiting.  10/08/15  Yes Laura Beers, MD  pravastatin (PRAVACHOL) 40 MG tablet Take 1 tablet (40 mg total) by mouth daily. Patient taking differently: 40 mg by PEG Tube route every evening.  07/20/15  Yes Laura Drown, MD  warfarin (COUMADIN) 4 MG tablet Take 4 mg by mouth every evening.   Yes Historical Provider, MD  Water For Irrigation, Sterile (FREE WATER) SOLN Place 200 mLs into feeding tube every 8 (eight) hours. 10/21/15   Laura Dike, MD     Allergies:  No Known Allergies  Social History:   reports that she quit smoking about 8 months ago. Her smoking use included Cigarettes. She has a 7.5 pack-year smoking history. She does not have any smokeless tobacco history on file. She reports that she does not drink alcohol or use illicit drugs.  Family History: Family History  Problem Relation Age of Onset  . Cancer Father     Liver  . Cancer Sister      Breast  . Diabetes Brother   . Dementia Mother      Physical Exam: Filed Vitals:   11/10/15 0739 11/10/15 0745 11/10/15 0800 11/10/15 0900  BP:   122/54 134/58  Pulse:   93 92  Temp:  98.2 F (36.8 C)    TempSrc:  Axillary    Resp:   18 17  Height:      Weight:      SpO2: 100%  100% 100%   Blood pressure 134/58, pulse 92, temperature 98.2 F (36.8 C), temperature source Axillary, resp. rate 17, height 5\' 6"  (1.676 m), weight 40.7 kg (89 lb 11.6 oz), SpO2 100 %.  GEN:  Pleasant patient lying in the stretcher in no acute distress; cooperative with exam. On Bipap.  PSYCH:  alert and oriented x4; HEENT: Mucous membranes pink and anicteric; PERRLA; EOM intact; no cervical lymphadenopathy nor thyromegaly or carotid bruit; no JVD; There were no stridor. Neck is very supple. Breasts:: Not examined CHEST WALL: No tenderness CHEST: Normal respiration, clear to auscultation bilaterally.  On Bipap.  HEART: Regular rate and rhythm.  There are no murmur, rub, or gallops.   BACK: No kyphosis or scoliosis; no CVA tenderness ABDOMEN: soft and non-tender; no masses, no organomegaly, normal abdominal bowel sounds; no pannus; no intertriginous candida. There is no rebound and no distention. Rectal Exam: Not done EXTREMITIES: No bone or joint deformity; age-appropriate arthropathy of the hands and knees; no edema; no ulcerations.  There is no calf tenderness. Genitalia: not examined PULSES: 2+ and symmetric SKIN: Normal hydration no rash or ulceration CNS: Cranial nerves 2-12 grossly intact no focal lateralizing neurologic deficit.  Speech is fluent; uvula elevated with phonation, facial symmetry and tongue midline. DTR are normal bilaterally, cerebella exam is intact, barbinski is negative and strengths are equaled bilaterally.  No sensory loss.   Labs on Admission:  Basic Metabolic Panel:  Recent Labs Lab 11/04/15 0745 11/06/15 0503 11/07/15 0416 11/08/15 0602 11/08/15 1700  11/09/15 0428  NA 139 141 139 139  --  145  K 4.2 4.4 4.0 3.1*  --  5.3*  CL 104 105 103 103  --  108  CO2 29 26 28 29   --  28  GLUCOSE 150* 262* 144* 186*  --  101*  BUN 31* 28* 31* 24*  --  28*  CREATININE 0.51 0.54 0.66 0.61  --  0.83  CALCIUM 8.3* 8.8* 8.3* 8.1*  --  8.8*  MG  --  2.2  --   --  2.2 2.1   Liver Function Tests:  Recent Labs Lab 11/04/15 0745  AST 17  ALT 17  ALKPHOS 42  BILITOT 0.3  PROT 5.1*  ALBUMIN 2.0*   CBC:  Recent Labs Lab 11/04/15 0745 11/06/15 0503 11/07/15 0416 11/08/15 0602 11/09/15 0428  WBC 8.6 11.5* 9.2 9.0 10.4  NEUTROABS  --  7.1  --   --   --   HGB 8.0* 8.5* 7.5* 7.7* 8.4*  HCT 27.0* 27.9* 25.1* 24.5* 27.6*  MCV 85.4 83.3 83.9 81.7 82.6  PLT 593* 689* 572* 554* 627*   Cardiac Enzymes:  Recent Labs Lab 11/06/15 0503 11/08/15 1700 11/08/15 2223 11/09/15 0428  TROPONINI 0.03 0.04* 0.03 0.03    CBG:  Recent Labs Lab 11/09/15 1604 11/09/15 1945 11/09/15 2334 11/10/15 0420 11/10/15 0720  GLUCAP 80 140* 116* 113* 186*     Radiological Exams on Admission: Dg Chest Port 1 View  11/09/2015  CLINICAL DATA:  Acute respiratory failure with hypoxia. Aspiration pneumonia. Congestive heart failure. EXAM: PORTABLE CHEST 1 VIEW COMPARISON:  11/08/2015 FINDINGS: Cardiomegaly stable. Pulmonary vascular congestion and symmetric lower lung airspace disease shows no significant change allowing for ladder radiograph technique, most consistent with pulmonary edema. Probable layering bilateral pleural effusions again noted. IMPRESSION: No significant change in cardiomegaly, probable diffuse pulmonary edema, and bilateral pleural effusions, consistent with congestive heart failure. Electronically Signed   By: Earle Gell M.D.   On: 11/09/2015 07:44   Dg Chest Port 1 View  11/08/2015  CLINICAL DATA:  Hypoxia EXAM: PORTABLE CHEST - 1 VIEW COMPARISON:  11/08/2015 FINDINGS: Cardiac shadow remains enlarged. Bibasilar changes are again seen  with pleural effusions. Mild vascular congestion is noted. No focal confluent infiltrate is seen. No bony abnormality is noted. Gastrostomy catheter is noted over the upper abdomen. IMPRESSION: Stable appearance of the chest when compare with the prior exam consistent with mild CHF. Electronically Signed   By: Inez Catalina M.D.   On: 11/08/2015 14:41   Dg Chest [facility]  11/08/2015  CLINICAL DATA:  Pneumonia, cough EXAM: PORTABLE CHEST 1 VIEW COMPARISON:  11/07/2015 FINDINGS: Cardiomegaly with vascular congestion. Bilateral layering effusions and lower lobe opacities, likely edema. Findings are similar to prior study. No acute bony abnormality. IMPRESSION: Suspect mild CHF with bilateral lower lobe opacities, likely edema and layering effusions. No real change. Electronically Signed   By: Rolm Baptise M.D.   On: 11/08/2015 10:09   Assessment/Plan Present on Admission:  . (Resolved) Respiratory failure ([place]) . Acute on chronic diastolic CHF (congestive heart failure) ([place]) . Acute respiratory failure with hypoxia ([place]) . 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 Aspiration pneumonia ([place]) . Dyslipidemia . Essential hypertension, benign . Malnutrition of moderate degree  PLAN: Aspiration pneumonia, tube feeds/gastric contents, recurrent - As per family, patient apparently found supine in bed at SNF which would contribute to aspiration. Patient to be nursed head up while on tube feeds. - Treating empirically with IV vancomycin and Zosyn - Check Procalcitonin <0.1 - Improving. No fevers. Leukocytosis resolved.  - DC vancomycin. D/c Zosyn and give Augmentin via PEG tube  - Blood cultures 2 negative to date.   Acute on chronic diastolic CHF - Changed Lasix to 40 mg IV every 12 hourly on admission. - BNP on admission: 3854 - clinically improved but intermittently mildly tachypnea. Not much peripheral edema but repeated chest x-ray today suggests persistent mild CHF. - Changed  and gave Lasix 60 mg IV 1. Follow BNP and clinically in a.m.  Acute respiratory failure with hypoxia - Secondary to aspiration pneumonia and decompensated CHF. - Presented with oxygen saturations in the mid 80s. Transiently placed on BiPAP. - hypoxia resolved. Will d/c IV Steroids.   Hypokalemia - Secondary to diuresis. Replace aggressively and follow BMP in a.m.  Essential hypertension - Controlled. Resumed home dose of metoprolol.  Type II DM - Sliding-scale insulin. Controlled.  Chronic atrial fibrillation - Continue Coumadin per pharmacy and metoprolol. INR 11/21: 3.38.  Dysphagia status post PEG tube - Resumed tube feeds. Dietitian consultation for tube feed management.  - Speech therapy evaluation appreciated. Recommend dysphagia 1 diet and honey thickened liquids.  Urinary retention - Has had chronic indwelling Foley catheter for the last 2 months. Last changed on 11/19 at SNF. Outpatient urology consultation.  CVA with residual right hemiparesis - Continue Coumadin anticoagulation. PT and OT evaluation  Possible seizure disorder - Continue Keppra  Chronic anemia - Hemoglobin has dropped from 8.5 on 11/20 to 7.5 on 11/21. Hemoglobin has been fluctuating in the 7. 5-8 range over the last several days. No reported bleeding. Follow CBC in a.m. Transfuse PRBCs if hemoglobin less than 7 g per DL. - Hemoglobin stable in the mid 7 g per DL range.   Left parotidectomy for malignant tumor  Thrombocytosis, possibly reactive - Improving.  Prolonged QTC - Repeat EKG: QTC 5:15 milliseconds. Magnesium: 2.2. Minimize QT prolonging medications. -Unclear etiology. Replace potassium today. Continue monitoring on telemetry. Follow EKG in a.m.   Recently treated Escherichia coli UTI  Adult failure to thrive  - Had extensive discussion with patient's spouse at bedside on 11/22. Patient has had a complicated  course since her surgery early October 2016.  Palliative care's  consult noted and appreciated it.   Other plans as per orders.  Code Status: Partial ( no code blue, no intubation, no CPR. Cardiac Meds and [place] Meth, MD. [facility] Pager [phone] 7pm to 7am.  11/10/2015, 9:42 AM

## 2015-11-10 NOTE — Progress Notes (Signed)
Subjective: She is improving. She has no new complaints. She did require BiPAP. She is now on nasal oxygen.  Objective: Vital signs in last 24 hours: Temp:  [96.9 F (36.1 C)-98.4 F (36.9 C)] 98.3 F (36.8 C) (11/24 1127) Pulse Rate:  [74-115] 94 (11/24 1106) Resp:  [14-34] 20 (11/24 1106) BP: (104-156)/(53-90) 134/58 mmHg (11/24 0900) SpO2:  [93 %-100 %] 99 % (11/24 1106) Weight:  [40.7 kg (89 lb 11.6 oz)] 40.7 kg (89 lb 11.6 oz) (11/24 0500) Weight change: -0.5 kg (-1 lb 1.6 oz) Last BM Date: 11/10/15  Intake/Output from previous day: 11/23 0701 - 11/24 0700 In: 940 [NG/GT:640] Out: 1550 [Urine:1550]  PHYSICAL EXAM General appearance: alert, cooperative and no distress Resp: rhonchi bilaterally Cardio: irregularly irregular rhythm GI: soft, non-tender; bowel sounds normal; no masses,  no organomegaly Extremities: extremities normal, atraumatic, no cyanosis or edema  Lab Results:  Results for orders placed or performed during the hospital encounter of 11/06/15 (from the past 48 hour(s))  Blood gas, arterial     Status: Abnormal   Collection Time: 11/08/15  4:20 PM  Result Value Ref Range   FIO2 40.00    Delivery systems BILEVEL POSITIVE AIRWAY PRESSURE    Mode BILEVEL POSITIVE AIRWAY PRESSURE    Inspiratory PAP 16    Expiratory PAP 8    pH, Arterial 7.471 (H) 7.350 - 7.450   pCO2 arterial 38.4 35.0 - 45.0 mmHg   pO2, Arterial 127.00 (H) 80.0 - 100.0 mmHg   Bicarbonate 28.2 (H) 20.0 - 24.0 mEq/L   Acid-Base Excess 4.0 (H) 0.0 - 2.0 mmol/L   O2 Saturation 98.6 %   Collection site RIGHT BRACHIAL    Drawn by [phone]    Sample type ARTERIAL   Glucose, capillary     Status: Abnormal   Collection Time: 11/08/15  4:34 PM  Result Value Ref Range   Glucose-Capillary 116 (H) 65 - 99 mg/dL   Comment 1 Notify RN    Comment 2 Document in Chart   Troponin I (q 6hr x 3)     Status: Abnormal   Collection Time: 11/08/15  5:00 PM  Result Value Ref Range   Troponin I 0.04 (H)  <0.031 ng/mL    Comment:        PERSISTENTLY INCREASED TROPONIN VALUES IN THE RANGE OF 0.04-0.49 ng/mL CAN BE SEEN IN:       -UNSTABLE ANGINA       -CONGESTIVE HEART FAILURE       -MYOCARDITIS       -CHEST TRAUMA       -ARRYHTHMIAS       -LATE PRESENTING MYOCARDIAL INFARCTION       -COPD   CLINICAL FOLLOW-UP RECOMMENDED.   Magnesium     Status: None   Collection Time: 11/08/15  5:00 PM  Result Value Ref Range   Magnesium 2.2 1.7 - 2.4 mg/dL  Glucose, capillary     Status: Abnormal   Collection Time: 11/08/15  8:29 PM  Result Value Ref Range   Glucose-Capillary 163 (H) 65 - 99 mg/dL   Comment 1 Notify RN   Troponin I (q 6hr x 3)     Status: None   Collection Time: 11/08/15 10:23 PM  Result Value Ref Range   Troponin I 0.03 <0.031 ng/mL    Comment:        NO INDICATION OF MYOCARDIAL INJURY.   Glucose, capillary     Status: Abnormal   Collection Time: 11/08/15 11:31 PM  Result Value Ref Range   Glucose-Capillary 136 (H) 65 - 99 mg/dL  Blood gas, arterial     Status: Abnormal   Collection Time: 11/09/15  3:27 AM  Result Value Ref Range   FIO2 0.21    pH, Arterial 7.495 (H) 7.350 - 7.450   pCO2 arterial 34.1 (L) 35.0 - 45.0 mmHg   pO2, Arterial 56.9 (L) 80.0 - 100.0 mmHg   Bicarbonate 27.1 (H) 20.0 - 24.0 mEq/L   Acid-Base Excess 2.8 (H) 0.0 - 2.0 mmol/L   O2 Saturation 87.6 %   Patient temperature 37.0    Collection site LEFT RADIAL    Drawn by [id number]    Sample type ARTERIAL DRAW    Allens test (pass/fail) PASS PASS  Glucose, capillary     Status: Abnormal   Collection Time: 11/09/15  4:01 AM  Result Value Ref Range   Glucose-Capillary 100 (H) 65 - 99 mg/dL   Comment 1 Notify RN   Troponin I (q 6hr x 3)     Status: None   Collection Time: 11/09/15  4:28 AM  Result Value Ref Range   Troponin I 0.03 <0.031 ng/mL    Comment:        NO INDICATION OF MYOCARDIAL INJURY.   Protime-INR     Status: Abnormal   Collection Time: 11/09/15  4:28 AM  Result Value Ref  Range   Prothrombin Time 27.3 (H) 11.6 - 15.2 seconds   INR 2.58 (H) 0.00 - 1.49  Procalcitonin     Status: None   Collection Time: 11/09/15  4:28 AM  Result Value Ref Range   Procalcitonin <0.10 ng/mL    Comment:        Interpretation: PCT (Procalcitonin) <= 0.5 ng/mL: Systemic infection (sepsis) is not likely. Local bacterial infection is possible. (NOTE)         ICU PCT Algorithm               Non ICU PCT Algorithm    ----------------------------     ------------------------------         PCT < 0.25 ng/mL                 PCT < 0.1 ng/mL     Stopping of antibiotics            Stopping of antibiotics       strongly encouraged.               strongly encouraged.    ----------------------------     ------------------------------       PCT level decrease by               PCT < 0.25 ng/mL       >= 80% from peak PCT       OR PCT 0.25 - 0.5 ng/mL          Stopping of antibiotics                                             encouraged.     Stopping of antibiotics           encouraged.    ----------------------------     ------------------------------       PCT level decrease by              PCT >= 0.25 ng/mL       <  80% from peak PCT        AND PCT >= 0.5 ng/mL            Continuin g antibiotics                                              encouraged.       Continuing antibiotics            encouraged.    ----------------------------     ------------------------------     PCT level increase compared          PCT > 0.5 ng/mL         with peak PCT AND          PCT >= 0.5 ng/mL             Escalation of antibiotics                                          strongly encouraged.      Escalation of antibiotics        strongly encouraged.   CBC     Status: Abnormal   Collection Time: 11/09/15  4:28 AM  Result Value Ref Range   WBC 10.4 4.0 - 10.5 K/uL   RBC 3.34 (L) 3.87 - 5.11 MIL/uL   Hemoglobin 8.4 (L) 12.0 - 15.0 g/dL   HCT 27.6 (L) 36.0 - 46.0 %   MCV 82.6 78.0 - 100.0 fL   MCH  25.1 (L) 26.0 - 34.0 pg   MCHC 30.4 30.0 - 36.0 g/dL   RDW 17.0 (H) 11.5 - 15.5 %   Platelets 627 (H) 150 - 400 K/uL  Basic metabolic panel     Status: Abnormal   Collection Time: 11/09/15  4:28 AM  Result Value Ref Range   Sodium 145 135 - 145 mmol/L   Potassium 5.3 (H) 3.5 - 5.1 mmol/L    Comment: DELTA CHECK NOTED   Chloride 108 101 - 111 mmol/L   CO2 28 22 - 32 mmol/L   Glucose, Bld 101 (H) 65 - 99 mg/dL   BUN 28 (H) 6 - 20 mg/dL   Creatinine, Ser 0.83 0.44 - 1.00 mg/dL   Calcium 8.8 (L) 8.9 - 10.3 mg/dL   GFR calc non Af Amer >60 >60 mL/min   GFR calc Af Amer >60 >60 mL/min    Comment: (NOTE) The eGFR has been calculated using the CKD EPI equation. This calculation has not been validated in all clinical situations. eGFR's persistently <60 mL/min signify possible Chronic Kidney Disease.    Anion gap 9 5 - 15  Brain natriuretic peptide     Status: Abnormal   Collection Time: 11/09/15  4:28 AM  Result Value Ref Range   B Natriuretic Peptide >4500.0 (H) 0.0 - 100.0 pg/mL  Magnesium     Status: None   Collection Time: 11/09/15  4:28 AM  Result Value Ref Range   Magnesium 2.1 1.7 - 2.4 mg/dL  Glucose, capillary     Status: Abnormal   Collection Time: 11/09/15  7:26 AM  Result Value Ref Range   Glucose-Capillary 135 (H) 65 - 99 mg/dL  Glucose, capillary     Status: Abnormal   Collection Time: 11/09/15 11:46 AM  Result Value Ref Range   Glucose-Capillary 154 (H) 65 - 99 mg/dL  Glucose, capillary     Status: None   Collection Time: 11/09/15  4:04 PM  Result Value Ref Range   Glucose-Capillary 80 65 - 99 mg/dL   Comment 1 Notify RN    Comment 2 Document in Chart   Glucose, capillary     Status: Abnormal   Collection Time: 11/09/15  7:45 PM  Result Value Ref Range   Glucose-Capillary 140 (H) 65 - 99 mg/dL  Glucose, capillary     Status: Abnormal   Collection Time: 11/09/15 11:34 PM  Result Value Ref Range   Glucose-Capillary 116 (H) 65 - 99 mg/dL  Glucose, capillary      Status: Abnormal   Collection Time: 11/10/15  4:20 AM  Result Value Ref Range   Glucose-Capillary 113 (H) 65 - 99 mg/dL  Protime-INR     Status: Abnormal   Collection Time: 11/10/15  4:55 AM  Result Value Ref Range   Prothrombin Time 31.8 (H) 11.6 - 15.2 seconds   INR 3.16 (H) 0.00 - 1.49  Glucose, capillary     Status: Abnormal   Collection Time: 11/10/15  7:20 AM  Result Value Ref Range   Glucose-Capillary 186 (H) 65 - 99 mg/dL   Comment 1 Notify RN    Comment 2 Document in Chart   Glucose, capillary     Status: Abnormal   Collection Time: 11/10/15 11:13 AM  Result Value Ref Range   Glucose-Capillary 163 (H) 65 - 99 mg/dL   Comment 1 Notify RN    Comment 2 Document in Chart     ABGS  Recent Labs  11/09/15 0327  PHART 7.495*  PO2ART 56.9*  HCO3 27.1*   CULTURES Recent Results (from the past 240 hour(s))  Urine culture     Status: None   Collection Time: 11/06/15  6:02 AM  Result Value Ref Range Status   Specimen Description URINE, CLEAN CATCH  Final   Special Requests NONE  Final   Culture   Final    60,000 COLONIES/ml YEAST Performed at [facility]    Report Status 11/08/2015 FINAL  Final  Culture, blood (routine x 2)     Status: None (Preliminary result)   Collection Time: 11/06/15  6:56 AM  Result Value Ref Range Status   Specimen Description BLOOD RIGHT HAND  Final   Special Requests BOTTLES DRAWN AEROBIC AND ANAEROBIC 4CC EACH  Final   Culture NO GROWTH 3 DAYS  Final   Report Status PENDING  Incomplete  Culture, blood (routine x 2)     Status: None (Preliminary result)   Collection Time: 11/06/15  7:04 AM  Result Value Ref Range Status   Specimen Description BLOOD LEFT HAND  Final   Special Requests BOTTLES DRAWN AEROBIC AND ANAEROBIC [place]  Final   Culture NO GROWTH 3 DAYS  Final   Report Status PENDING  Incomplete   Studies/Results: Dg Chest Port 1 View  11/09/2015  CLINICAL DATA:  Acute respiratory failure with hypoxia. Aspiration  pneumonia. Congestive heart failure. EXAM: PORTABLE CHEST 1 VIEW COMPARISON:  11/08/2015 FINDINGS: Cardiomegaly stable. Pulmonary vascular congestion and symmetric lower lung airspace disease shows no significant change allowing for ladder radiograph technique, most consistent with pulmonary edema. Probable layering bilateral pleural effusions again noted. IMPRESSION: No significant change in cardiomegaly, probable diffuse pulmonary edema, and bilateral pleural effusions, consistent with congestive heart failure. Electronically Signed   By: Earle Gell  M.D.   On: 11/09/2015 07:44   Dg Chest Port 1 View  11/08/2015  CLINICAL DATA:  Hypoxia EXAM: PORTABLE CHEST - 1 VIEW COMPARISON:  11/08/2015 FINDINGS: Cardiac shadow remains enlarged. Bibasilar changes are again seen with pleural effusions. Mild vascular congestion is noted. No focal confluent infiltrate is seen. No bony abnormality is noted. Gastrostomy catheter is noted over the upper abdomen. IMPRESSION: Stable appearance of the chest when compare with the prior exam consistent with mild CHF. Electronically Signed   By: Inez Catalina M.D.   On: 11/08/2015 14:41    Medications:  Prior to Admission:  Prescriptions prior to admission  Medication Sig Dispense Refill Last Dose  . aspirin EC 81 MG EC tablet Take 1 tablet (81 mg total) by mouth daily.   11/05/2015 at Unknown time  . clotrimazole-betamethasone (LOTRISONE) cream Apply 1 application topically daily as needed (APPLY A THIN LAYER AS BARRIER AFTER CLEANING AND DRYING BUTTOCKS AREA COMPLETELY).   UNKNOWN  . FLUoxetine (PROZAC) 10 MG capsule TAKE 1 CAPSULE BY MOUTH DAILY FOR DEPRESSION/ANXIETY (Patient taking differently: TAKE 1 CAPSULE VIA PEG DAILY FOR DEPRESSION/ANXIETY) 90 capsule 1 11/05/2015 at Unknown time  . furosemide (LASIX) 20 MG tablet 20 mg by PEG Tube route daily.   11/05/2015 at Unknown time  . levETIRAcetam (KEPPRA) 750 MG tablet TAKE 1 TABLET BY MOUTH EVERY MORNING AND 2 TABLETS BY  MOUTH EVERY NIGHT AT BEDTIME (Patient taking differently: TAKE 1 TABLET VIA PEG TUBE IN THE MORNING & 2 TABLETS VIA PEG TUBE EVERY NIGHT AT BEDTIME) 270 tablet 3 11/05/2015 at Unknown time  . loperamide (IMODIUM) 2 MG capsule Take 1 capsule (2 mg total) by mouth every 4 (four) hours as needed for diarrhea or loose stools. 30 capsule 0 11/04/2015 at Unknown time  . metFORMIN (GLUCOPHAGE) 500 MG tablet Take one half tablet BID (Patient taking differently: 250 mg by PEG Tube route 2 (two) times daily with a meal. ) 30 tablet 5 11/05/2015 at Unknown time  . methylphenidate (RITALIN) 5 MG tablet Give one tablet per tube twice daily with breakfast and lunch 60 tablet 0 11/05/2015 at Unknown time  . metoCLOPramide (REGLAN) 10 MG tablet Place 1 tablet (10 mg total) into feeding tube every 6 (six) hours as needed for nausea.   11/05/2015 at Unknown time  . metoprolol tartrate (LOPRESSOR) 25 MG tablet Take 25 mg by mouth 2 (two) times daily.   11/05/2015 at 2100  . Nutritional Supplements (FEEDING SUPPLEMENT, VITAL AF 1.2 CAL,) LIQD Place 1,000 mLs into feeding tube daily.   11/05/2015 at Unknown time  . nystatin (MYCOSTATIN/NYSTOP) 100000 UNIT/GM POWD Apply topically 3 (three) times daily. **APPLY A DUSTING TO RASH ON PERINEUM AND BUTTOCKS AT [facility] SHIFT   UNKNOWN  . ondansetron (ZOFRAN ODT) 4 MG disintegrating tablet Take 1 tablet (4 mg total) by mouth every 8 (eight) hours as needed for nausea or vomiting. (Patient taking differently: 4 mg by PEG Tube route every 8 (eight) hours as needed for nausea or vomiting. ) 20 tablet 0 11/05/2015 at Unknown time  . pravastatin (PRAVACHOL) 40 MG tablet Take 1 tablet (40 mg total) by mouth daily. (Patient taking differently: 40 mg by PEG Tube route every evening. ) 30 tablet 12 11/05/2015 at Unknown time  . warfarin (COUMADIN) 4 MG tablet Take 4 mg by mouth every evening.   11/05/2015 at 1700  . [DISCONTINUED] ciprofloxacin (CIPRO) 250 MG tablet Place 1 tablet (250 mg total)  into feeding tube 2 (two) times  daily. 6 tablet 0 11/05/2015 at Unknown time  . Water For Irrigation, Sterile (FREE WATER) SOLN Place 200 mLs into feeding tube every 8 (eight) hours.   10/29/2015 at Unknown time   Scheduled: . amoxicillin-clavulanate  1 tablet Per Tube Q12H  . antiseptic oral rinse  7 mL Mouth Rinse q12n4p  . aspirin  81 mg Per Tube Daily  . chlorhexidine  15 mL Mouth Rinse BID  . free water  200 mL Per Tube 3 times per day  . insulin aspart  0-9 Units Subcutaneous 6 times per day  . ipratropium-albuterol  3 mL Nebulization Q6H WA  . levETIRAcetam  1,500 mg Per Tube QHS  . levETIRAcetam  750 mg Per Tube Daily  . methylphenidate  5 mg Per Tube BID WC  . metoprolol tartrate  25 mg Per Tube BID  . pravastatin  40 mg Per Tube QPM  . sodium chloride  3 mL Intravenous Q12H  . Warfarin - Pharmacist Dosing Inpatient   Does not apply q1800   Continuous: . feeding supplement (VITAL AF 1.2 CAL) 1,000 mL (11/10/15 0800)   KFW:[id number] **OR** acetaminophen, albuterol, guaiFENesin-dextromethorphan  Assesment: She was admitted with aspiration pneumonia and was improving from that but developed acute pulmonary edema and recurrent acute on chronic respiratory failure. She required BiPAP but is now on nasal oxygen. She has improved markedly. She has had a previous stroke and has dysphagia at baseline. 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 Discussed with family. Plan is for  her to remain in step down today and potentially move tomorrow. I will be out of town tomorrow but will return on the 26th.    LOS: 4 days   Belkis Norbeck L 11/10/2015, 12:36 PM

## 2015-11-10 NOTE — Progress Notes (Signed)
Call from nurse to give pt treatment pt in distress hr 130 rr 30 and audible wheezing. Pt placed back on BIPAP due to increased work of breathing

## 2015-11-10 NOTE — Progress Notes (Signed)
ANTICOAGULATION CONSULT NOTE   Pharmacy Consult for Coumadin Indication: atrial fibrillation  No Known Allergies  Patient Measurements: Height: 5\' 6"  (167.6 cm) Weight: 89 lb 11.6 oz (40.7 kg) IBW/kg (Calculated) : 59.3kg  Vital Signs: Temp: 98.2 F (36.8 C) (11/24 0745) Temp Source: Axillary (11/24 0745) BP: 122/54 mmHg (11/24 0800) Pulse Rate: 93 (11/24 0800)  Labs:  Recent Labs  11/08/15 0602 11/08/15 1700 11/08/15 2223 11/09/15 0428 11/10/15 0455  HGB 7.7*  --   --  8.4*  --   HCT 24.5*  --   --  27.6*  --   PLT 554*  --   --  627*  --   LABPROT 33.5*  --   --  27.3* 31.8*  INR 3.38*  --   --  2.58* 3.16*  CREATININE 0.61  --   --  0.83  --   TROPONINI  --  0.04* 0.03 0.03  --    Estimated Creatinine Clearance: 38.2 mL/min (by C-G formula based on Cr of 0.83).  Medical History: Past Medical History  Diagnosis Date  . Essential hypertension, benign   . Type 2 diabetes mellitus ([place])   . Osteoporosis 06/2008  . 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 History of parotid cancer    Medications:  See med rec  Assessment: Chronic afib on coumadin. INR trended up to SUPRAtherapeutic level today. No bleeding reported.    Goal of Therapy:  INR 2-3 Monitor platelets by anticoagulation protocol: Yes   Plan:  HOLD coumadin today to discourage further rise in INR Daily PT/INR Monitor for S/S of bleeding  Thanks for allowing pharmacy to be a part of this patient's care.  Hart Robinsons, PharmD Clinical Pharmacist 11/10/2015,9:05 AM

## 2015-11-11 ENCOUNTER — Ambulatory Visit: Payer: Medicare HMO | Admitting: Vascular Surgery

## 2015-11-11 LAB — PROTIME-INR
INR: 2.41 — AB (ref 0.00–1.49)
Prothrombin Time: 25.9 seconds — ABNORMAL HIGH (ref 11.6–15.2)

## 2015-11-11 LAB — BASIC METABOLIC PANEL
ANION GAP: 10 (ref 5–15)
BUN: 39 mg/dL — ABNORMAL HIGH (ref 6–20)
CALCIUM: 8.8 mg/dL — AB (ref 8.9–10.3)
CHLORIDE: 107 mmol/L (ref 101–111)
CO2: 28 mmol/L (ref 22–32)
Creatinine, Ser: 0.89 mg/dL (ref 0.44–1.00)
GFR calc non Af Amer: >60 mL/min (ref >60)
GLUCOSE: 64 mg/dL — AB (ref 65–99)
POTASSIUM: 3.7 mmol/L (ref 3.5–5.1)
Sodium: 145 mmol/L (ref 135–145)

## 2015-11-11 LAB — CULTURE, BLOOD (ROUTINE X 2)
CULTURE: NO GROWTH
CULTURE: NO GROWTH

## 2015-11-11 LAB — GLUCOSE, CAPILLARY
GLUCOSE-CAPILLARY: 149 mg/dL — AB (ref 65–99)
GLUCOSE-CAPILLARY: 92 mg/dL (ref 65–99)
GLUCOSE-CAPILLARY: 99 mg/dL (ref 65–99)
Glucose-Capillary: 168 mg/dL — ABNORMAL HIGH (ref 65–99)
Glucose-Capillary: 75 mg/dL (ref 65–99)
Glucose-Capillary: 108 mg/dL — ABNORMAL HIGH (ref 65–99)

## 2015-11-11 LAB — MAGNESIUM: Magnesium: 2.5 mg/dL — ABNORMAL HIGH (ref 1.7–2.4)

## 2015-11-11 LAB — PROCALCITONIN: Procalcitonin: <0.1 ng/mL

## 2015-11-11 MED ORDER — WARFARIN - PHARMACIST DOSING INPATIENT
Status: DC
  Administered 2015-11-13 – 2015-11-14 (×2)

## 2015-11-11 MED ORDER — WARFARIN SODIUM 2 MG PO TABS
2.0000 mg | ORAL_TABLET | Freq: Once | ORAL | Status: AC
  Administered 2015-11-11: 2 mg via ORAL
  Filled 2015-11-11: qty 1

## 2015-11-11 MED ORDER — FUROSEMIDE 20 MG PO TABS
20.0000 mg | ORAL_TABLET | Freq: Every day | ORAL | Status: DC
  Administered 2015-11-11 – 2015-11-15 (×5): 20 mg
  Filled 2015-11-11 (×5): qty 1

## 2015-11-11 MED ORDER — CETYLPYRIDINIUM CHLORIDE 0.05 % MT LIQD
7.0000 mL | Freq: Two times a day (BID) | OROMUCOSAL | Status: DC
  Administered 2015-11-11 – 2015-11-15 (×7): 7 mL via OROMUCOSAL

## 2015-11-11 NOTE — Progress Notes (Signed)
ANTICOAGULATION CONSULT NOTE   Pharmacy Consult for Coumadin Indication: atrial fibrillation  No Known Allergies  Patient Measurements: Height: 5\' 6"  (167.6 cm) Weight: 90 lb 9.7 oz (41.1 kg) IBW/kg (Calculated) : 59.3kg  Vital Signs: Temp: 96.7 F (35.9 C) (11/25 0733) Temp Source: Oral (11/25 0733) BP: 129/56 mmHg (11/25 0600) Pulse Rate: 73 (11/25 0600)  Labs:  Recent Labs  11/08/15 1700 11/08/15 2223 11/09/15 0428 11/10/15 0455 11/11/15 0427  HGB  --   --  8.4*  --   --   HCT  --   --  27.6*  --   --   PLT  --   --  627*  --   --   LABPROT  --   --  27.3* 31.8* 25.9*  INR  --   --  2.58* 3.16* 2.41*  CREATININE  --   --  0.83  --   --   TROPONINI 0.04* 0.03 0.03  --   --    Estimated Creatinine Clearance: 38.6 mL/min (by C-G formula based on Cr of 0.83).  Medical History: Past Medical History  Diagnosis Date  . Essential hypertension, benign   . Type 2 diabetes mellitus ([place])   . Osteoporosis 06/2008  . 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 History of parotid cancer    Medications:  See med rec  Assessment: Chronic afib on coumadin. INR therapeutic.  No bleeding reported.    Goal of Therapy:  INR 2-3 Monitor platelets by anticoagulation protocol: Yes   Plan:  Coumadin 2mg  po today x 1 Daily PT/INR Monitor for S/S of bleeding  Thanks for allowing pharmacy to be a part of this patient's care.  Hart Robinsons, PharmD Clinical Pharmacist 11/11/2015,9:30 AM

## 2015-11-11 NOTE — Progress Notes (Signed)
Subjective:    No complaints this AM  Objective:   Temp:  [96.7 F (35.9 C)-98.3 F (36.8 C)] 96.7 F (35.9 C) (11/25 0733) Pulse Rate:  [71-94] 73 (11/25 0600) Resp:  [15-24] 17 (11/25 0600) BP: (113-139)/(47-103) 129/56 mmHg (11/25 0600) SpO2:  [88 %-100 %] 100 % (11/25 0735) Weight:  [90 lb 9.7 oz (41.1 kg)] 90 lb 9.7 oz (41.1 kg) (11/25 0500) Last BM Date: 11/10/15  Filed Weights   11/09/15 0500 11/10/15 0500 11/11/15 0500  Weight: 90 lb 13.3 oz (41.2 kg) 89 lb 11.6 oz (40.7 kg) 90 lb 9.7 oz (41.1 kg)    Intake/Output Summary (Last 24 hours) at 11/11/15 0937 Last data filed at 11/11/15 0908  Gross per 24 hour  Intake   1333 ml  Output    902 ml  Net    431 ml    Telemetry: SR  Exam:  General: NAD  Resp: CTAB  Cardiac: RRR, no m/r/g, no jvd  GI: abdomen soft, NT, ND  MSK: no LE edema  Neuro: no focal deficits  Psych: appropriate affect  Lab Results:  Basic Metabolic Panel:  Recent Labs Lab 11/06/15 0503 11/07/15 0416 11/08/15 0602 11/08/15 1700 11/09/15 0428  NA 141 139 139  --  145  K 4.4 4.0 3.1*  --  5.3*  CL 105 103 103  --  108  CO2 26 28 29   --  28  GLUCOSE 262* 144* 186*  --  101*  BUN 28* 31* 24*  --  28*  CREATININE 0.54 0.66 0.61  --  0.83  CALCIUM 8.8* 8.3* 8.1*  --  8.8*  MG 2.2  --   --  2.2 2.1    Liver Function Tests: No results for input(s): AST, ALT, ALKPHOS, BILITOT, PROT, ALBUMIN in the last 168 hours.  CBC:  Recent Labs Lab 11/07/15 0416 11/08/15 0602 11/09/15 0428  WBC 9.2 9.0 10.4  HGB 7.5* 7.7* 8.4*  HCT 25.1* 24.5* 27.6*  MCV 83.9 81.7 82.6  PLT 572* 554* 627*    Cardiac Enzymes:  Recent Labs Lab 11/08/15 1700 11/08/15 2223 11/09/15 0428  TROPONINI 0.04* 0.03 0.03    BNP: No results for input(s): PROBNP in the last 8760 hours.  Coagulation:  Recent Labs Lab 11/09/15 0428 11/10/15 0455 11/11/15 0427  INR 2.58* 3.16* 2.41*    ECG:   Medications:   Scheduled Medications: .  amoxicillin-clavulanate  1 tablet Per Tube Q12H  . antiseptic oral rinse  7 mL Mouth Rinse q12n4p  . antiseptic oral rinse  7 mL Mouth Rinse BID  . aspirin  81 mg Per Tube Daily  . chlorhexidine  15 mL Mouth Rinse BID  . free water  200 mL Per Tube 3 times per day  . insulin aspart  0-9 Units Subcutaneous 6 times per day  . ipratropium-albuterol  3 mL Nebulization Q6H WA  . levETIRAcetam  1,500 mg Per Tube QHS  . levETIRAcetam  750 mg Per Tube Daily  . methylphenidate  5 mg Per Tube BID WC  . metoprolol tartrate  25 mg Per Tube BID  . pravastatin  40 mg Per Tube QPM  . sodium chloride  3 mL Intravenous Q12H  . warfarin  2 mg Oral Once  . Warfarin - Pharmacist Dosing Inpatient   Does not apply Q24H     Infusions: . feeding supplement (VITAL AF 1.2 CAL) 1,000 mL (11/10/15 1800)     PRN Medications:  acetaminophen **OR**  acetaminophen, albuterol, guaiFENesin-dextromethorphan     Assessment/Plan    1. Acute on chronic diastolic HF - patient with recurrent admissions with aspiration pneumonia, being followed by pulmonary - had some evidence of pulmonary edema on CXR on admission, she has been diuresing.  - echo this admit LVEF 50-55%, no WMAs, indet diastolic function - [id number] mL yesterday, negative 2.5 liters since admission. Weight down 4 lbs.  - She has history of chronic diasotlic HF.Has not had labs done today. Currently not on any diuretics.  - will start lasix 20mg  daily through peg tube as maintence.    2. Abnormal EKG - EKG with deep inverted T-waves inferior and lateral precordial leads. Troponins negative, she has had no chest pain. Echo with normal LVEF, no WMAs.  -  She would be a poor candidate for any form of ischemic testing given her severe debilitated status - she also had long QTc. Reglan and prozac discontinued, keeping K at 4 and Mg at 2.  - f/u repeat ekg today  3. Palliative care - agree with palliative care involvement, from records patient has had  significant decline over the last few months with reoccuring aspiration and various other progressing medical problems. - she had changed her code status to limited code   4. Afib - patient is therapeutic on coumadin, rate controlled with lopressor       Carlyle Dolly, M.D.

## 2015-11-11 NOTE — Care Management Note (Signed)
Case Management Note  Patient Details  Name: SIDNEI WORMALD MRN: [id number] Date of Birth: March 23, 1941  Subjective/Objective:                    Action/Plan:   Expected Discharge Date:  11/09/15               Expected Discharge Plan:  Skilled Nursing Facility  In-House Referral:  Clinical Social Work  Discharge planning Services  CM Consult  Post Acute Care Choice:  NA Choice offered to:  NA  DME Arranged:    DME Agency:     HH Arranged:    [place] Agency:     Status of Service:  Completed, signed off  Medicare Important Message Given:  Yes Date Medicare IM Given:    Medicare IM give by:    Date Additional Medicare IM Given:    Additional Medicare Important Message give by:     If discussed at [place] of Stay Meetings, dates discussed:    Additional Comments: Anticipate transfer to med surg floor on 11/12/15. Pt will discharge back to [facility] at discharge. Christinia Gully [place], RN 11/11/2015, 3:08 PM

## 2015-11-11 NOTE — Plan of Care (Signed)
Problem: Pain Management: Goal: Expressions of feelings of enhanced comfort will increase Outcome: Progressing Discussed pain management with patient with understanding and verbal communication back

## 2015-11-11 NOTE — Progress Notes (Signed)
Shift Report:  Patient rested well with oxygen via nasal cannula last night.  Note her urine is now tea colored.

## 2015-11-11 NOTE — Care Management Important Message (Signed)
Important Message  Patient Details  Name: Laura Melendez MRN: [id number] Date of Birth: 12-07-1941   Medicare Important Message Given:  Yes    Joylene Draft, RN 11/11/2015, 3:09 PM

## 2015-11-11 NOTE — Progress Notes (Signed)
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 fib on Coumadin, osteoporosis, pneumothorax status post chest tube, HTN, chronic indwelling Foley catheter for the last 2 months secondary to urinary retention, multiple hospitalizations (4 in the last 2 months), recent admission to [facility] 10/29/15-11/02/15 for acute hypoxic respiratory failure due to acute on chronic diastolic CHF, admitted to [facility] on 11/06/15 with new onset dyspnea, hypoxia and suspected aspiration. She was admitted to stepdown unit for aspiration pneumonia, decompensated diastolic CHF and acute respiratory failure with hypoxia. She was seen in consultation with cardiolology and pulmonary. She is now doing better off Bipap. Palliative care consultation was requested, and Laura Melendez saw her. Plan to proceed with SNF for rehab, and to continue with partial code status. She did require Bipap at times, but she is improving.   Rewiew of Systems:  Constitutional: Negative for malaise, fever and chills. No significant weight loss or weight gain Eyes: Negative for eye pain, redness and discharge, diplopia, visual changes, or flashes of light. ENMT: Negative for ear pain, hoarseness, nasal congestion, sinus pressure and sore throat. No headaches; tinnitus, drooling, or problem swallowing. Cardiovascular: Negative for chest pain, palpitations, diaphoresis, dyspnea and peripheral edema. ; No orthopnea, PND Respiratory: Negative for cough, hemoptysis, wheezing and stridor. No pleuritic chestpain. Gastrointestinal: Negative for nausea, vomiting, diarrhea, constipation, abdominal  pain, melena, blood in stool, hematemesis, jaundice and rectal bleeding.    Genitourinary: Negative for frequency, dysuria, incontinence,flank pain and hematuria; Musculoskeletal: Negative for back pain and neck pain. Negative for swelling and trauma.;  Skin: . Negative for pruritus, rash, abrasions, bruising and skin lesion.; ulcerations Neuro: Negative for headache, lightheadedness and neck stiffness. Negative for weakness, altered level of consciousness , altered mental status, extremity weakness, burning feet, involuntary movement, seizure and syncope.  Psych: negative for anxiety, depression, insomnia, tearfulness, panic attacks, hallucinations, paranoia, suicidal or homicidal ideation    Past Medical History  Diagnosis Date  . Essential hypertension, benign   . Type 2 diabetes mellitus ([place])   . Osteoporosis 06/2008  . Stroke [facility]) 03/2015    right hemiparesis  . History of parotid cancer     Past Surgical History  Procedure Laterality Date  . Ankle surgery Left 2004    otif  . Abdominal hysterectomy    . Anterior and posterior repair N/A 03/11/2013    Procedure: ANTERIOR (CYSTOCELE) AND POSTERIOR REPAIR (RECTOCELE);  Surgeon: Linda Hedges, DO;  Location: [place] [facility];  Service: Gynecology;  Laterality: N/A;  with sacrospinous ligament fixation bilateral  . Colonoscopy  2009    negative  . Cataract extraction w/phaco Right 12/27/2014    Procedure: CATARACT EXTRACTION PHACO AND INTRAOCULAR LENS PLACEMENT RIGHT EYE;  Surgeon: Tonny Branch, MD;  Location: [facility];  Service: Ophthalmology;  Laterality: Right;  CDE:10.63  . Cataract extraction w/phaco Left 01/13/2015    Procedure: CATARACT EXTRACTION PHACO AND INTRAOCULAR LENS PLACEMENT LEFT EYE;  Surgeon: Tonny Branch, MD;  Location: [facility];  Service: Ophthalmology;  Laterality: Left;  CDE:18.87  . Cholecystectomy    . Tee without cardioversion N/A 04/01/2015    Procedure: TRANSESOPHAGEAL ECHOCARDIOGRAM (TEE);  Surgeon: Sanda Klein, MD;   Location: [facility]  ENDOSCOPY;  Service: Cardiovascular;  Laterality: N/A;  . Peg placement    . Parotidectomy      Medications:  HOME MEDS: Prior to Admission medications   Medication Sig Start Date End Date Taking? Authorizing Annia Gomm  aspirin EC 81 MG EC tablet Take 1 tablet (81 mg total) by mouth daily. 11/02/15  Yes Estela Leonie Green, MD  clotrimazole-betamethasone (LOTRISONE) cream Apply 1 application topically daily as needed (APPLY A THIN LAYER AS BARRIER AFTER CLEANING AND DRYING BUTTOCKS AREA COMPLETELY).   Yes Historical Lorette Peterkin, MD  FLUoxetine (PROZAC) 10 MG capsule TAKE 1 CAPSULE BY MOUTH DAILY FOR DEPRESSION/ANXIETY Patient taking differently: TAKE 1 CAPSULE VIA PEG DAILY FOR DEPRESSION/ANXIETY 09/15/15  Yes [name], MD  furosemide (LASIX) 20 MG tablet 20 mg by PEG Tube route daily.   Yes Historical Brason Berthelot, MD  levETIRAcetam (KEPPRA) 750 MG tablet TAKE 1 TABLET BY MOUTH EVERY MORNING AND 2 TABLETS BY MOUTH EVERY NIGHT AT BEDTIME Patient taking differently: TAKE 1 TABLET VIA PEG TUBE IN THE MORNING & 2 TABLETS VIA PEG TUBE EVERY NIGHT AT BEDTIME 08/01/15  Yes [name], MD  loperamide (IMODIUM) 2 MG capsule Take 1 capsule (2 mg total) by mouth every 4 (four) hours as needed for diarrhea or loose stools. 10/21/15  Yes [name], MD  metFORMIN (GLUCOPHAGE) 500 MG tablet Take one half tablet BID Patient taking differently: 250 mg by PEG Tube route 2 (two) times daily with a meal.  04/25/15  Yes [name], MD  methylphenidate (RITALIN) 5 MG tablet Give one tablet per tube twice daily with breakfast and lunch 11/03/15  Yes [name], DO  metoCLOPramide (REGLAN) 10 MG tablet Place 1 tablet (10 mg total) into feeding tube every 6 (six) hours as needed for nausea. 10/21/15  Yes [name], MD  metoprolol tartrate (LOPRESSOR) 25 MG tablet Take 25 mg by mouth 2 (two) times daily.   Yes Historical Benito Lemmerman, MD  Nutritional Supplements (FEEDING SUPPLEMENT, VITAL AF  1.2 CAL,) LIQD Place 1,000 mLs into feeding tube daily. 10/21/15  Yes [name], MD  nystatin (MYCOSTATIN/NYSTOP) 100000 UNIT/GM POWD Apply topically 3 (three) times daily. **APPLY A DUSTING TO RASH ON PERINEUM AND BUTTOCKS AT [facility] SHIFT   Yes Historical Caitriona Sundquist, MD  ondansetron (ZOFRAN ODT) 4 MG disintegrating tablet Take 1 tablet (4 mg total) by mouth every 8 (eight) hours as needed for nausea or vomiting. Patient taking differently: 4 mg by PEG Tube route every 8 (eight) hours as needed for nausea or vomiting.  10/08/15  Yes [name], MD  pravastatin (PRAVACHOL) 40 MG tablet Take 1 tablet (40 mg total) by mouth daily. Patient taking differently: 40 mg by PEG Tube route every evening.  07/20/15  Yes [name], MD  warfarin (COUMADIN) 4 MG tablet Take 4 mg by mouth every evening.   Yes Historical Eline Geng, MD  Water For Irrigation, Sterile (FREE WATER) SOLN Place 200 mLs into feeding tube every 8 (eight) hours. 10/21/15   [name], MD     Allergies:  No Known Allergies  Social History:   reports that she quit smoking about 8 months ago. Her smoking use included Cigarettes. She has a 7.5 pack-year smoking history. She does not have any smokeless tobacco history on file. She reports that she does not drink alcohol or use illicit drugs.  Family History: Family History  Problem Relation Age of Onset  . Cancer Father     Liver  . Cancer Sister  Breast  . Diabetes Brother   . Dementia Mother      Physical Exam: Filed Vitals:   11/11/15 0500 11/11/15 0600 11/11/15 0733 11/11/15 0735  BP: 119/52 129/56    Pulse: 79 73    Temp:   96.7 F (35.9 C)   TempSrc:   Oral   Resp: 18 17    Height:      Weight: 41.1 kg (90 lb 9.7 oz)     SpO2: 100% 100%  100%   Blood pressure 129/56, pulse 73, temperature 96.7 F (35.9 C), temperature source Oral, resp. rate 17, height 5\' 6"  (1.676 m), weight 41.1 kg (90 lb 9.7 oz), SpO2 100 %.  GEN:  Pleasant  patient lying in the  stretcher in no acute distress; cooperative with exam. PSYCH:  alert and oriented x4; does not appear anxious or depressed; affect is appropriate. HEENT: Mucous membranes pink and anicteric; PERRLA; EOM intact; no cervical lymphadenopathy nor thyromegaly or carotid bruit; no JVD; There were no stridor. Neck is very supple. Breasts:: Not examined CHEST WALL: No tenderness CHEST: Normal respiration, clear to auscultation bilaterally.  HEART: Regular rate and rhythm.  There are no murmur, rub, or gallops.   BACK: No kyphosis or scoliosis; no CVA tenderness ABDOMEN: soft and non-tender; no masses, no organomegaly, normal abdominal bowel sounds; no pannus; no intertriginous candida. There is no rebound and no distention. Rectal Exam: Not done EXTREMITIES: No bone or joint deformity; age-appropriate arthropathy of the hands and knees; no edema; no ulcerations.  There is no calf tenderness. Genitalia: not examined PULSES: 2+ and symmetric SKIN: Normal hydration no rash or ulceration CNS: Cranial nerves 2-12 grossly intact no focal lateralizing neurologic deficit.  Speech is fluent; uvula elevated with phonation, facial symmetry and tongue midline. DTR are normal bilaterally, cerebella exam is intact, barbinski is negative and strengths are equaled bilaterally.  No sensory loss.   Labs on Admission:  Basic Metabolic Panel:  Recent Labs Lab 11/06/15 0503 11/07/15 0416 11/08/15 0602 11/08/15 1700 11/09/15 0428  NA 141 139 139  --  145  K 4.4 4.0 3.1*  --  5.3*  CL 105 103 103  --  108  CO2 26 28 29   --  28  GLUCOSE 262* 144* 186*  --  101*  BUN 28* 31* 24*  --  28*  CREATININE 0.54 0.66 0.61  --  0.83  CALCIUM 8.8* 8.3* 8.1*  --  8.8*  MG 2.2  --   --  2.2 2.1   Liver Function Tests: CBC:  Recent Labs Lab 11/06/15 0503 11/07/15 0416 11/08/15 0602 11/09/15 0428  WBC 11.5* 9.2 9.0 10.4  NEUTROABS 7.1  --   --   --   HGB 8.5* 7.5* 7.7* 8.4*  HCT 27.9* 25.1* 24.5* 27.6*  MCV 83.3  83.9 81.7 82.6  PLT 689* 572* 554* 627*   Cardiac Enzymes:  Recent Labs Lab 11/06/15 0503 11/08/15 1700 11/08/15 2223 11/09/15 0428  TROPONINI 0.03 0.04* 0.03 0.03    CBG:  Recent Labs Lab 11/10/15 1624 11/10/15 1959 11/11/15 0109 11/11/15 0433 11/11/15 0736  GLUCAP 128* 128* 168* 75 108*    Assessment/Plan Present on Admission:  . (Resolved) Respiratory failure ([place]) . Acute on chronic diastolic CHF (congestive heart failure) ([place]) . Acute respiratory failure with hypoxia ([place]) . 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 Aspiration pneumonia ([place]) . Dyslipidemia . Essential hypertension, benign . Malnutrition of moderate degree   PLAN: Aspiration pneumonia, tube feeds/gastric contents,  recurrent - As per family, patient apparently found supine in bed at SNF which would contribute to aspiration. Patient to be nursed head up while on tube feeds. Was initially Tx with Van/Zosyn, now on Augmentin.- Blood cultures 2 negative to date. Will keep in ICU today, transfer to floor with Qhs Bipap if remained stable.   Acute on chronic diastolic CHF - Changed Lasix to 40 mg IV every 12 hourly on admission. - BNP on admission: 3854 - clinically improved but intermittently SOB suggesting persistent mild CHF.  Acute respiratory failure with hypoxia - Secondary to aspiration pneumonia and decompensated CHF. Steroid was given, now discontinued. - hypoxia resolved.   Hypokalemia - Secondary to diuresis. Replaced  Essential hypertension - Controlled. Resumed home dose of metoprolol.  Type II DM - Sliding-scale insulin. Controlled.  Chronic atrial fibrillation - Continue Coumadin per pharmacy and metoprolol. INR 11/21: 3.38.  Dysphagia status post PEG tube - Resumed tube feeds. Dietitian consultation for tube feed management.  - Speech therapy evaluation appreciated. Recommend dysphagia 1 diet and honey thickened liquids.  Urinary retention - Has had  chronic indwelling Foley catheter for the last 2 months. Last changed on 11/19 at SNF. Outpatient urology consultation.  CVA with residual right hemiparesis - Continue Coumadin anticoagulation. PT and OT evaluation  Possible seizure disorder - Continue Keppra  Chronic anemia - Hemoglobin has dropped from 8.5 on 11/20 to 7.5 on 11/21. Hemoglobin has been fluctuating in the 7. 5-8 range over the last several days. No reported bleeding. Follow CBC in a.m. Transfuse PRBCs if hemoglobin less than 7 g per DL. - Hemoglobin stable in the mid 7 g per DL range.   Left parotidectomy for malignant tumor  Thrombocytosis, possibly reactive - Improving.  Prolonged QTC - Repeat EKG: QTC 5:15 milliseconds. Magnesium: 2.2. Minimize QT prolonging medications. -Unclear etiology. Replace potassium today. Continue monitoring on telemetry. Follow EKG in a.m.   Recently treated Escherichia coli UTI  Adult failure to thrive  - Had extensive discussion with patient's spouse at bedside on 11/22. Patient has had a complicated  course since her surgery early October 2016. Palliative care's consult noted and appreciated it.   Other plans as per orders.  Code Status: Partial ( no code blue, no intubation, no CPR. Cardiac Meds and Seward Meth, MD. [facility] Pager [phone] 7pm to 7am.  11/11/2015, 9:28 AM

## 2015-11-12 LAB — GLUCOSE, CAPILLARY
GLUCOSE-CAPILLARY: 125 mg/dL — AB (ref 65–99)
Glucose-Capillary: 190 mg/dL — ABNORMAL HIGH (ref 65–99)
Glucose-Capillary: 95 mg/dL (ref 65–99)
Glucose-Capillary: 132 mg/dL — ABNORMAL HIGH (ref 65–99)
Glucose-Capillary: 142 mg/dL — ABNORMAL HIGH (ref 65–99)
Glucose-Capillary: 122 mg/dL — ABNORMAL HIGH (ref 65–99)

## 2015-11-12 LAB — BASIC METABOLIC PANEL
Anion gap: 7 (ref 5–15)
BUN: 33 mg/dL — AB (ref 6–20)
CALCIUM: 8.9 mg/dL (ref 8.9–10.3)
CO2: 29 mmol/L (ref 22–32)
CREATININE: 0.79 mg/dL (ref 0.44–1.00)
Chloride: 106 mmol/L (ref 101–111)
GFR calc Af Amer: >60 mL/min (ref >60)
GLUCOSE: 84 mg/dL (ref 65–99)
Potassium: 3.8 mmol/L (ref 3.5–5.1)
Sodium: 142 mmol/L (ref 135–145)

## 2015-11-12 LAB — PROTIME-INR
INR: 1.71 — AB (ref 0.00–1.49)
PROTHROMBIN TIME: 20 s — AB (ref 11.6–15.2)

## 2015-11-12 MED ORDER — WARFARIN SODIUM 5 MG PO TABS
2.5000 mg | ORAL_TABLET | Freq: Once | ORAL | Status: AC
  Administered 2015-11-12: 2.5 mg via ORAL
  Filled 2015-11-12: qty 1

## 2015-11-12 NOTE — Progress Notes (Signed)
[place] [place] for Coumadin Indication: atrial fibrillation  No Known Allergies  Patient Measurements: Height: 5\' 6"  (167.6 cm) Weight: 88 lb 6.5 oz (40.1 kg) IBW/kg (Calculated) : 59.3kg  Vital Signs: Temp: 97.1 F (36.2 C) (11/26 0623) Temp Source: Axillary (11/26 0623) BP: 141/61 mmHg (11/26 0600) Pulse Rate: 69 (11/26 0600)  Labs:  Recent Labs  11/10/15 0455 11/11/15 0427 11/12/15 0449  LABPROT 31.8* 25.9* 20.0*  INR 3.16* 2.41* 1.71*  CREATININE  --  0.89 0.79   Estimated Creatinine Clearance: 39.1 mL/min (by C-G formula based on Cr of 0.79).  Medical History: Past Medical History  Diagnosis Date  . Essential hypertension, benign   . Type 2 diabetes mellitus ([place])   . Osteoporosis 06/2008  . Stroke [facility]) 03/2015    right hemiparesis  . History of parotid cancer    Assessment: Chronic afib on coumadin. INR is unstable, has trended down to SUBtherapeutic level.  No bleeding reported.  Home dose reportedly 4mg  daily but doses have been reduced this admission due to elevated INR's.  Pt receiving tube feedings.  Small, elderly lady.   Goal of Therapy:  INR 2-3 Monitor platelets by anticoagulation protocol: Yes   Plan:  Coumadin 2.5mg  po today x 1 Daily PT/INR Monitor for S/S of bleeding  Thanks for allowing pharmacy to be a part of this patient's care.  Hart Robinsons, PharmD Clinical Pharmacist 11/12/2015,8:50 AM

## 2015-11-12 NOTE — Progress Notes (Signed)
Subjective: She seems to be doing better. She has not required BiPAP. She is awake and alert. She has pulled her oxygen off her oxygen saturation is 97% at least briefly off the oxygen  Objective: Vital signs in last 24 hours: Temp:  [97 F (36.1 C)-97.9 F (36.6 C)] 97.1 F (36.2 C) (11/26 0623) Pulse Rate:  [69-95] 69 (11/26 0600) Resp:  [12-23] 18 (11/26 0600) BP: (123-156)/(53-75) 141/61 mmHg (11/26 0600) SpO2:  [89 %-100 %] 100 % (11/26 0833) Weight:  [40.1 kg (88 lb 6.5 oz)] 40.1 kg (88 lb 6.5 oz) (11/26 3474) Weight change: -1 kg (-2 lb 3.3 oz) Last BM Date: 11/11/15  Intake/Output from previous day: 11/25 0701 - 11/26 0700 In: 910 [I.V.:10; NG/GT:800] Out: 804 [Urine:800; Stool:4]  PHYSICAL EXAM General appearance: alert, cooperative and no distress Resp: clear to auscultation bilaterally Cardio: irregularly irregular rhythm GI: soft, non-tender; bowel sounds normal; no masses,  no organomegaly Extremities: extremities normal, atraumatic, no cyanosis or edema  Lab Results:  Results for orders placed or performed during the hospital encounter of 11/06/15 (from the past 48 hour(s))  Glucose, capillary     Status: Abnormal   Collection Time: 11/10/15 11:13 AM  Result Value Ref Range   Glucose-Capillary 163 (H) 65 - 99 mg/dL   Comment 1 Notify RN    Comment 2 Document in Chart   Glucose, capillary     Status: Abnormal   Collection Time: 11/10/15  4:24 PM  Result Value Ref Range   Glucose-Capillary 128 (H) 65 - 99 mg/dL  Glucose, capillary     Status: Abnormal   Collection Time: 11/10/15  7:59 PM  Result Value Ref Range   Glucose-Capillary 128 (H) 65 - 99 mg/dL  Glucose, capillary     Status: Abnormal   Collection Time: 11/11/15  1:09 AM  Result Value Ref Range   Glucose-Capillary 168 (H) 65 - 99 mg/dL  Protime-INR     Status: Abnormal   Collection Time: 11/11/15  4:27 AM  Result Value Ref Range   Prothrombin Time 25.9 (H) 11.6 - 15.2 seconds   INR 2.41 (H)  0.00 - 1.49  Procalcitonin     Status: None   Collection Time: 11/11/15  4:27 AM  Result Value Ref Range   Procalcitonin <0.10 ng/mL  Basic metabolic panel     Status: Abnormal   Collection Time: 11/11/15  4:27 AM  Result Value Ref Range   Sodium 145 135 - 145 mmol/L   Potassium 3.7 3.5 - 5.1 mmol/L    Comment: DELTA CHECK NOTED   Chloride 107 101 - 111 mmol/L   CO2 28 22 - 32 mmol/L   Glucose, Bld 64 (L) 65 - 99 mg/dL   BUN 39 (H) 6 - 20 mg/dL   Creatinine, Ser 0.89 0.44 - 1.00 mg/dL   Calcium 8.8 (L) 8.9 - 10.3 mg/dL   GFR calc non Af Amer >60 >60 mL/min   GFR calc Af Amer >60 >60 mL/min    Comment: (NOTE) The eGFR has been calculated using the CKD EPI equation. This calculation has not been validated in all clinical situations. eGFR's persistently <60 mL/min signify possible Chronic Kidney Disease.    Anion gap 10 5 - 15  Magnesium     Status: Abnormal   Collection Time: 11/11/15  4:27 AM  Result Value Ref Range   Magnesium 2.5 (H) 1.7 - 2.4 mg/dL  Glucose, capillary     Status: None   Collection Time: 11/11/15  4:33 AM  Result Value Ref Range   Glucose-Capillary 75 65 - 99 mg/dL  Glucose, capillary     Status: Abnormal   Collection Time: 11/11/15  7:36 AM  Result Value Ref Range   Glucose-Capillary 108 (H) 65 - 99 mg/dL  Glucose, capillary     Status: Abnormal   Collection Time: 11/11/15 10:47 AM  Result Value Ref Range   Glucose-Capillary 149 (H) 65 - 99 mg/dL  Glucose, capillary     Status: None   Collection Time: 11/11/15  3:44 PM  Result Value Ref Range   Glucose-Capillary 92 65 - 99 mg/dL   Comment 1 Document in Chart   Glucose, capillary     Status: None   Collection Time: 11/11/15  7:58 PM  Result Value Ref Range   Glucose-Capillary 99 65 - 99 mg/dL  Glucose, capillary     Status: Abnormal   Collection Time: 11/12/15 12:58 AM  Result Value Ref Range   Glucose-Capillary 190 (H) 65 - 99 mg/dL  Glucose, capillary     Status: None   Collection Time:  11/12/15  4:07 AM  Result Value Ref Range   Glucose-Capillary 95 65 - 99 mg/dL  Protime-INR     Status: Abnormal   Collection Time: 11/12/15  4:49 AM  Result Value Ref Range   Prothrombin Time 20.0 (H) 11.6 - 15.2 seconds   INR 1.71 (H) 0.00 - 6.71  Basic metabolic panel     Status: Abnormal   Collection Time: 11/12/15  4:49 AM  Result Value Ref Range   Sodium 142 135 - 145 mmol/L   Potassium 3.8 3.5 - 5.1 mmol/L   Chloride 106 101 - 111 mmol/L   CO2 29 22 - 32 mmol/L   Glucose, Bld 84 65 - 99 mg/dL   BUN 33 (H) 6 - 20 mg/dL   Creatinine, Ser 0.79 0.44 - 1.00 mg/dL   Calcium 8.9 8.9 - 10.3 mg/dL   GFR calc non Af Amer >60 >60 mL/min   GFR calc Af Amer >60 >60 mL/min    Comment: (NOTE) The eGFR has been calculated using the CKD EPI equation. This calculation has not been validated in all clinical situations. eGFR's persistently <60 mL/min signify possible Chronic Kidney Disease.    Anion gap 7 5 - 15  Glucose, capillary     Status: Abnormal   Collection Time: 11/12/15  7:50 AM  Result Value Ref Range   Glucose-Capillary 132 (H) 65 - 99 mg/dL   Comment 1 Notify RN    Comment 2 Document in Chart     ABGS No results for input(s): PHART, PO2ART, TCO2, HCO3 in the last 72 hours.  Invalid input(s): PCO2 CULTURES Recent Results (from the past 240 hour(s))  Urine culture     Status: None   Collection Time: 11/06/15  6:02 AM  Result Value Ref Range Status   Specimen Description URINE, CLEAN CATCH  Final   Special Requests NONE  Final   Culture   Final    60,000 COLONIES/ml YEAST Performed at [facility]    Report Status 11/08/2015 FINAL  Final  Culture, blood (routine x 2)     Status: None   Collection Time: 11/06/15  6:56 AM  Result Value Ref Range Status   Specimen Description BLOOD RIGHT HAND  Final   Special Requests BOTTLES DRAWN AEROBIC AND ANAEROBIC 4CC EACH  Final   Culture NO GROWTH 5 DAYS  Final   Report Status 11/11/2015 FINAL  Final  Culture, blood  (routine x 2)     Status: None   Collection Time: 11/06/15  7:04 AM  Result Value Ref Range Status   Specimen Description BLOOD LEFT HAND  Final   Special Requests BOTTLES DRAWN AEROBIC AND ANAEROBIC [place]  Final   Culture NO GROWTH 5 DAYS  Final   Report Status 11/11/2015 FINAL  Final   Studies/Results: No results found.  Medications:  Prior to Admission:  Prescriptions prior to admission  Medication Sig Dispense Refill Last Dose  . aspirin EC 81 MG EC tablet Take 1 tablet (81 mg total) by mouth daily.   11/05/2015 at Unknown time  . clotrimazole-betamethasone (LOTRISONE) cream Apply 1 application topically daily as needed (APPLY A THIN LAYER AS BARRIER AFTER CLEANING AND DRYING BUTTOCKS AREA COMPLETELY).   UNKNOWN  . FLUoxetine (PROZAC) 10 MG capsule TAKE 1 CAPSULE BY MOUTH DAILY FOR DEPRESSION/ANXIETY (Patient taking differently: TAKE 1 CAPSULE VIA PEG DAILY FOR DEPRESSION/ANXIETY) 90 capsule 1 11/05/2015 at Unknown time  . furosemide (LASIX) 20 MG tablet 20 mg by PEG Tube route daily.   11/05/2015 at Unknown time  . levETIRAcetam (KEPPRA) 750 MG tablet TAKE 1 TABLET BY MOUTH EVERY MORNING AND 2 TABLETS BY MOUTH EVERY NIGHT AT BEDTIME (Patient taking differently: TAKE 1 TABLET VIA PEG TUBE IN THE MORNING & 2 TABLETS VIA PEG TUBE EVERY NIGHT AT BEDTIME) 270 tablet 3 11/05/2015 at Unknown time  . loperamide (IMODIUM) 2 MG capsule Take 1 capsule (2 mg total) by mouth every 4 (four) hours as needed for diarrhea or loose stools. 30 capsule 0 11/04/2015 at Unknown time  . metFORMIN (GLUCOPHAGE) 500 MG tablet Take one half tablet BID (Patient taking differently: 250 mg by PEG Tube route 2 (two) times daily with a meal. ) 30 tablet 5 11/05/2015 at Unknown time  . methylphenidate (RITALIN) 5 MG tablet Give one tablet per tube twice daily with breakfast and lunch 60 tablet 0 11/05/2015 at Unknown time  . metoCLOPramide (REGLAN) 10 MG tablet Place 1 tablet (10 mg total) into feeding tube every 6  (six) hours as needed for nausea.   11/05/2015 at Unknown time  . metoprolol tartrate (LOPRESSOR) 25 MG tablet Take 25 mg by mouth 2 (two) times daily.   11/05/2015 at 2100  . Nutritional Supplements (FEEDING SUPPLEMENT, VITAL AF 1.2 CAL,) LIQD Place 1,000 mLs into feeding tube daily.   11/05/2015 at Unknown time  . nystatin (MYCOSTATIN/NYSTOP) 100000 UNIT/GM POWD Apply topically 3 (three) times daily. **APPLY A DUSTING TO RASH ON PERINEUM AND BUTTOCKS AT [facility] SHIFT   UNKNOWN  . ondansetron (ZOFRAN ODT) 4 MG disintegrating tablet Take 1 tablet (4 mg total) by mouth every 8 (eight) hours as needed for nausea or vomiting. (Patient taking differently: 4 mg by PEG Tube route every 8 (eight) hours as needed for nausea or vomiting. ) 20 tablet 0 11/05/2015 at Unknown time  . pravastatin (PRAVACHOL) 40 MG tablet Take 1 tablet (40 mg total) by mouth daily. (Patient taking differently: 40 mg by PEG Tube route every evening. ) 30 tablet 12 11/05/2015 at Unknown time  . warfarin (COUMADIN) 4 MG tablet Take 4 mg by mouth every evening.   11/05/2015 at 1700  . [DISCONTINUED] ciprofloxacin (CIPRO) 250 MG tablet Place 1 tablet (250 mg total) into feeding tube 2 (two) times daily. 6 tablet 0 11/05/2015 at Unknown time  . Water For Irrigation, Sterile (FREE WATER) SOLN Place 200 mLs into feeding tube every 8 (eight)  hours.   10/29/2015 at Unknown time   Scheduled: . amoxicillin-clavulanate  1 tablet Per Tube Q12H  . antiseptic oral rinse  7 mL Mouth Rinse q12n4p  . antiseptic oral rinse  7 mL Mouth Rinse BID  . aspirin  81 mg Per Tube Daily  . chlorhexidine  15 mL Mouth Rinse BID  . free water  200 mL Per Tube 3 times per day  . furosemide  20 mg Per Tube Daily  . insulin aspart  0-9 Units Subcutaneous 6 times per day  . ipratropium-albuterol  3 mL Nebulization Q6H WA  . levETIRAcetam  1,500 mg Per Tube QHS  . levETIRAcetam  750 mg Per Tube Daily  . methylphenidate  5 mg Per Tube BID WC  . metoprolol tartrate   25 mg Per Tube BID  . pravastatin  40 mg Per Tube QPM  . sodium chloride  3 mL Intravenous Q12H  . warfarin  2.5 mg Oral Once  . Warfarin - Pharmacist Dosing Inpatient   Does not apply Q24H   Continuous: . feeding supplement (VITAL AF 1.2 CAL) 1,000 mL (11/10/15 1800)   TOI:[id number] **OR** acetaminophen, albuterol, guaiFENesin-dextromethorphan  Assesment: She was admitted with aspiration pneumonia related to dysphagia from her stroke. She then developed acute on chronic respiratory failure which was multi-factorial and included acute on chronic diastolic heart failure. She had what looked like pulmonary edema and is much better now. She required BiPAP but has not needed that in the last 24 hours or so. 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 Her oxygenation is certainly adequate without BiPAP. I will plan to sign off. She is generally much improved. I will be glad to see her again if needed. She will need chest x-ray in about 4-6 weeks to document clearing of the pneumonia. Thanks for allowing me to see her with you   LOS: 6 days   Conchita Truxillo L 11/12/2015, 9:12 AM

## 2015-11-12 NOTE — Progress Notes (Signed)
Called report to [place], Therapist, sports on dept 300.  Verbalized understanding. Pt transferred to room 338 in safe and stable condition.

## 2015-11-12 NOTE — Progress Notes (Signed)
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 fib on Coumadin, osteoporosis, pneumothorax status post chest tube, HTN, chronic indwelling Foley catheter for the last 2 months secondary to urinary retention, multiple hospitalizations (4 in the last 2 months), recent admission to [facility] 10/29/15-11/02/15 for acute hypoxic respiratory failure due to acute on chronic diastolic CHF, admitted to [facility] on 11/06/15 with new onset dyspnea, hypoxia and suspected aspiration. She was admitted to stepdown unit for aspiration pneumonia, decompensated diastolic CHF and acute respiratory failure with hypoxia. She was seen in consultation with cardiolology and pulmonary. She is now doing better off Bipap. Palliative care consultation was requested, and Laura Melendez saw her. Plan to proceed with SNF for rehab, and to continue with partial code status. She no longer require bipap.  She is not SOB.   Rewiew of Systems:  Constitutional: Negative for malaise, fever and chills. No significant weight loss or weight gain Eyes: Negative for eye pain, redness and discharge, diplopia, visual changes, or flashes of light. ENMT: Negative for ear pain, hoarseness, nasal congestion, sinus pressure and sore throat. No headaches; tinnitus, drooling, or problem swallowing. Cardiovascular: Negative for chest pain, palpitations, diaphoresis, dyspnea and peripheral edema. ; No orthopnea, PND Respiratory: Negative for cough, hemoptysis, wheezing and stridor. No pleuritic chestpain. Gastrointestinal: Negative for nausea, vomiting, diarrhea, constipation, abdominal pain,  melena, blood in stool, hematemesis, jaundice and rectal bleeding.    Genitourinary: Negative for frequency, dysuria, incontinence,flank pain and hematuria; Musculoskeletal: Negative for back pain and neck pain. Negative for swelling and trauma.;  Skin: . Negative for pruritus, rash, abrasions, bruising and skin lesion.; ulcerations Neuro: Negative for headache, lightheadedness and neck stiffness. Negative for weakness, altered level of consciousness , altered mental status, extremity weakness, burning feet, involuntary movement, seizure and syncope.  Psych: negative for anxiety, depression, insomnia, tearfulness, panic attacks, hallucinations, paranoia, suicidal or homicidal ideation   Past Medical History  Diagnosis Date  . Essential hypertension, benign   . Type 2 diabetes mellitus ([name])   . Osteoporosis 06/2008  . Stroke [facility]) 03/2015    right hemiparesis  . History of parotid cancer     Past Surgical History  Procedure Laterality Date  . Ankle surgery Left 2004    otif  . Abdominal hysterectomy    . Anterior and posterior repair N/A 03/11/2013    Procedure: ANTERIOR (CYSTOCELE) AND POSTERIOR REPAIR (RECTOCELE);  Surgeon: Linda Hedges, DO;  Location: [place] [facility];  Service: Gynecology;  Laterality: N/A;  with sacrospinous ligament fixation bilateral  . Colonoscopy  2009    negative  . Cataract extraction w/phaco Right 12/27/2014    Procedure: CATARACT EXTRACTION PHACO AND INTRAOCULAR LENS PLACEMENT RIGHT EYE;  Surgeon: Tonny Branch, MD;  Location: [facility];  Service: Ophthalmology;  Laterality: Right;  CDE:10.63  . Cataract extraction w/phaco Left 01/13/2015    Procedure: CATARACT EXTRACTION PHACO AND INTRAOCULAR LENS PLACEMENT LEFT EYE;  Surgeon: Tonny Branch, MD;  Location: [facility];  Service: Ophthalmology;  Laterality: Left;  CDE:18.87  . Cholecystectomy    . Tee without cardioversion N/A 04/01/2015    Procedure: TRANSESOPHAGEAL ECHOCARDIOGRAM (TEE);  Surgeon: Sanda Klein, MD;  Location: [facility]  ENDOSCOPY;  Service: Cardiovascular;  Laterality: N/A;  . Peg placement    . Parotidectomy      Medications:  HOME MEDS: Prior to Admission medications   Medication Sig Start Date End Date Taking? Authorizing Provider  aspirin EC 81 MG EC tablet Take 1 tablet (81 mg total) by mouth daily. 11/02/15  Yes Estela Leonie Green, MD  clotrimazole-betamethasone (LOTRISONE) cream Apply 1 application topically daily as needed (APPLY A THIN LAYER AS BARRIER AFTER CLEANING AND DRYING BUTTOCKS AREA COMPLETELY).   Yes Historical Provider, MD  FLUoxetine (PROZAC) 10 MG capsule TAKE 1 CAPSULE BY MOUTH DAILY FOR DEPRESSION/ANXIETY Patient taking differently: TAKE 1 CAPSULE VIA PEG DAILY FOR DEPRESSION/ANXIETY 09/15/15  Yes [name], MD  furosemide (LASIX) 20 MG tablet 20 mg by PEG Tube route daily.   Yes Historical Provider, MD  levETIRAcetam (KEPPRA) 750 MG tablet TAKE 1 TABLET BY MOUTH EVERY MORNING AND 2 TABLETS BY MOUTH EVERY NIGHT AT BEDTIME Patient taking differently: TAKE 1 TABLET VIA PEG TUBE IN THE MORNING & 2 TABLETS VIA PEG TUBE EVERY NIGHT AT BEDTIME 08/01/15  Yes [name], MD  loperamide (IMODIUM) 2 MG capsule Take 1 capsule (2 mg total) by mouth every 4 (four) hours as needed for diarrhea or loose stools. 10/21/15  Yes [name], MD  metFORMIN (GLUCOPHAGE) 500 MG tablet Take one half tablet BID Patient taking differently: 250 mg by PEG Tube route 2 (two) times daily with a meal.  04/25/15  Yes [name], MD  methylphenidate (RITALIN) 5 MG tablet Give one tablet per tube twice daily with breakfast and lunch 11/03/15  Yes [name], DO  metoCLOPramide (REGLAN) 10 MG tablet Place 1 tablet (10 mg total) into feeding tube every 6 (six) hours as needed for nausea. 10/21/15  Yes [name], MD  metoprolol tartrate (LOPRESSOR) 25 MG tablet Take 25 mg by mouth 2 (two) times daily.   Yes Historical Provider, MD  Nutritional Supplements (FEEDING SUPPLEMENT, VITAL AF 1.2 CAL,) LIQD  Place 1,000 mLs into feeding tube daily. 10/21/15  Yes [name], MD  nystatin (MYCOSTATIN/NYSTOP) 100000 UNIT/GM POWD Apply topically 3 (three) times daily. **APPLY A DUSTING TO RASH ON PERINEUM AND BUTTOCKS AT [facility] SHIFT   Yes Historical Provider, MD  ondansetron (ZOFRAN ODT) 4 MG disintegrating tablet Take 1 tablet (4 mg total) by mouth every 8 (eight) hours as needed for nausea or vomiting. Patient taking differently: 4 mg by PEG Tube route every 8 (eight) hours as needed for nausea or vomiting.  10/08/15  Yes [name], MD  pravastatin (PRAVACHOL) 40 MG tablet Take 1 tablet (40 mg total) by mouth daily. Patient taking differently: 40 mg by PEG Tube route every evening.  07/20/15  Yes [name], MD  warfarin (COUMADIN) 4 MG tablet Take 4 mg by mouth every evening.   Yes Historical Provider, MD  Water For Irrigation, Sterile (FREE WATER) SOLN Place 200 mLs into feeding tube every 8 (eight) hours. 10/21/15   [name], MD     Allergies:  No Known Allergies  Social History:   reports that she quit smoking about 8 months ago. Her smoking use included Cigarettes. She has a 7.5 pack-year smoking history. She does not have any smokeless tobacco history on file. She reports that she does not drink alcohol or use illicit drugs.  Family History: Family History  Problem Relation Age of Onset  . Cancer Father     Liver  . Cancer Sister  Breast  . Diabetes Brother   . Dementia Mother      Physical Exam: Filed Vitals:   11/12/15 0500 11/12/15 0600 11/12/15 0623 11/12/15 0833  BP: 142/70 141/61    Pulse: 83 69    Temp:   97.1 F (36.2 C)   TempSrc:   Axillary   Resp: 18 18    Height:      Weight:   40.1 kg (88 lb 6.5 oz)   SpO2: 99% 100%  100%   Blood pressure 141/61, pulse 69, temperature 97.1 F (36.2 C), temperature source Axillary, resp. rate 18, height 5\' 6"  (1.676 m), weight 40.1 kg (88 lb 6.5 oz), SpO2 100 %.  GEN:  Pleasant  patient lying in the stretcher  in no acute distress; cooperative with exam. PSYCH:  alert and oriented x4; does not appear anxious or depressed; affect is appropriate. HEENT: Mucous membranes pink and anicteric; PERRLA; EOM intact; no cervical lymphadenopathy nor thyromegaly or carotid bruit; no JVD; There were no stridor. Neck is very supple. Breasts:: Not examined CHEST WALL: No tenderness CHEST: Normal respiration, clear to auscultation bilaterally.  HEART: Regular rate and rhythm.  There are no murmur, rub, or gallops.   BACK: No kyphosis or scoliosis; no CVA tenderness ABDOMEN: soft and non-tender; no masses, no organomegaly, normal abdominal bowel sounds; no pannus; no intertriginous candida. There is no rebound and no distention. Rectal Exam: Not done EXTREMITIES: No bone or joint deformity; age-appropriate arthropathy of the hands and knees; no edema; no ulcerations.  There is no calf tenderness. Genitalia: not examined PULSES: 2+ and symmetric SKIN: Normal hydration no rash or ulceration CNS: Cranial nerves 2-12 grossly intact no focal lateralizing neurologic deficit.  Speech is fluent; uvula elevated with phonation, facial symmetry and tongue midline. DTR are normal bilaterally, cerebella exam is intact, barbinski is negative and strengths are equaled bilaterally.  No sensory loss.    Recent Labs Lab 11/06/15 0503 11/07/15 0416 11/08/15 0602 11/08/15 1700 11/09/15 0428 11/11/15 0427 11/12/15 0449  NA 141 139 139  --  145 145 142  K 4.4 4.0 3.1*  --  5.3* 3.7 3.8  CL 105 103 103  --  108 107 106  CO2 26 28 29   --  28 28 29   GLUCOSE 262* 144* 186*  --  101* 64* 84  BUN 28* 31* 24*  --  28* 39* 33*  CREATININE 0.54 0.66 0.61  --  0.83 0.89 0.79  CALCIUM 8.8* 8.3* 8.1*  --  8.8* 8.8* 8.9  MG 2.2  --   --  2.2 2.1 2.5*  --    Liver Function Tests: CBC:  Recent Labs Lab 11/06/15 0503 11/07/15 0416 11/08/15 0602 11/09/15 0428  WBC 11.5* 9.2 9.0 10.4  NEUTROABS 7.1  --   --   --   HGB 8.5* 7.5*  7.7* 8.4*  HCT 27.9* 25.1* 24.5* 27.6*  MCV 83.3 83.9 81.7 82.6  PLT 689* 572* 554* 627*   Cardiac Enzymes:  Recent Labs Lab 11/06/15 0503 11/08/15 1700 11/08/15 2223 11/09/15 0428  TROPONINI 0.03 0.04* 0.03 0.03    CBG:  Recent Labs Lab 11/11/15 1544 11/11/15 1958 11/12/15 0058 11/12/15 0407 11/12/15 0750  GLUCAP 92 99 190* 95 132*   Assessment/Plan Present on Admission:  . (Resolved) Respiratory failure ([place]) . Acute on chronic diastolic CHF (congestive heart failure) ([place]) . Acute respiratory failure with hypoxia ([place]) . 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 Aspiration pneumonia ([place]) . Dyslipidemia .  Essential hypertension, benign . Malnutrition of moderate degree  PLAN:  PLAN: Aspiration pneumonia, tube feeds/gastric contents, recurrent - As per family, patient apparently found supine in bed at SNF which would contribute to aspiration. Patient to be nursed head up while on tube feeds. Was initially Tx with Van/Zosyn, now on Augmentin.- Blood cultures 2 negative to date. Transfer to floor with Qhs Bipap as she is doing well.   Acute on chronic diastolic CHF - Changed Lasix to 40 mg IV every 12 hourly on admission. - BNP on admission: 3854 - clinically improved but intermittently SOB suggesting persistent mild CHF.  Acute respiratory failure with hypoxia - Secondary to aspiration pneumonia and decompensated CHF. Steroid was given, now discontinued. - hypoxia resolved.   Hypokalemia - Secondary to diuresis. Replaced  Essential hypertension - Controlled. Resumed home dose of metoprolol.  Type II DM - Sliding-scale insulin. Controlled.  Chronic atrial fibrillation - Continue Coumadin per pharmacy and metoprolol. INR 11/21: 3.38.  Dysphagia status post PEG tube - Resumed tube feeds. Dietitian consultation for tube feed management.  - Speech therapy evaluation appreciated. Recommend dysphagia 1 diet and honey thickened  liquids.  Urinary retention - Has had chronic indwelling Foley catheter for the last 2 months. Last changed on 11/19 at SNF. Outpatient urology consultation.  CVA with residual right hemiparesis - Continue Coumadin anticoagulation. PT and OT evaluation  Possible seizure disorder - Continue Keppra  Chronic anemia - Hemoglobin has dropped from 8.5 on 11/20 to 7.5 on 11/21. Hemoglobin has been fluctuating in the 7. 5-8 range over the last several days. No reported bleeding. Follow CBC in a.m. Transfuse PRBCs if hemoglobin less than 7 g per DL. - Hemoglobin stable in the mid 7 g per DL range.   Left parotidectomy for malignant tumor  Thrombocytosis, possibly reactive - Improving.  Prolonged QTC - Repeat EKG: QTC 5:15 milliseconds. Magnesium: 2.2. Minimize QT prolonging medications. -Unclear etiology. Replace potassium today. Continue monitoring on telemetry. Follow EKG in a.m.   Recently treated Escherichia coli UTI  Adult failure to thrive  - Had extensive discussion with patient's spouse at bedside on 11/22. Patient has had a complicated  course since her surgery early October 2016. Palliative care's consult noted and appreciated it.   Other plans as per orders.  Code Status: Partial ( no code blue, no intubation, no CPR. Cardiac Meds and Seward Meth, MD. [facility] Pager [phone] 7pm to 7am.  11/12/2015, 9:52 AM

## 2015-11-13 ENCOUNTER — Inpatient Hospital Stay (HOSPITAL_COMMUNITY): Payer: Medicare HMO

## 2015-11-13 LAB — BASIC METABOLIC PANEL
Anion gap: 5 (ref 5–15)
BUN: 31 mg/dL — AB (ref 6–20)
CALCIUM: 8.9 mg/dL (ref 8.9–10.3)
CHLORIDE: 109 mmol/L (ref 101–111)
CO2: 27 mmol/L (ref 22–32)
CREATININE: 0.77 mg/dL (ref 0.44–1.00)
GFR calc non Af Amer: >60 mL/min (ref >60)
Glucose, Bld: 88 mg/dL (ref 65–99)
Potassium: 3.6 mmol/L (ref 3.5–5.1)
SODIUM: 141 mmol/L (ref 135–145)

## 2015-11-13 LAB — GLUCOSE, CAPILLARY
GLUCOSE-CAPILLARY: 156 mg/dL — AB (ref 65–99)
GLUCOSE-CAPILLARY: 171 mg/dL — AB (ref 65–99)
GLUCOSE-CAPILLARY: 94 mg/dL (ref 65–99)
Glucose-Capillary: 141 mg/dL — ABNORMAL HIGH (ref 65–99)
Glucose-Capillary: 104 mg/dL — ABNORMAL HIGH (ref 65–99)
Glucose-Capillary: 134 mg/dL — ABNORMAL HIGH (ref 65–99)

## 2015-11-13 LAB — BLOOD GAS, ARTERIAL
ACID-BASE EXCESS: 1.7 mmol/L (ref 0.0–2.0)
BICARBONATE: 26 meq/L — AB (ref 20.0–24.0)
DRAWN BY: 23534
O2 Content: 2 L/min
O2 Saturation: 96.9 %
PCO2 ART: 35.9 mmHg (ref 35.0–45.0)
PH ART: 7.46 — AB (ref 7.350–7.450)
pO2, Arterial: 89.1 mmHg (ref 80.0–100.0)

## 2015-11-13 LAB — PROTIME-INR
INR: 1.47 (ref 0.00–1.49)
PROTHROMBIN TIME: 17.9 s — AB (ref 11.6–15.2)

## 2015-11-13 MED ORDER — LORAZEPAM 1 MG PO TABS
2.0000 mg | ORAL_TABLET | Freq: Once | ORAL | Status: AC
  Administered 2015-11-13: 2 mg via ORAL
  Filled 2015-11-13: qty 2

## 2015-11-13 MED ORDER — WARFARIN SODIUM 5 MG PO TABS
5.0000 mg | ORAL_TABLET | Freq: Once | ORAL | Status: AC
  Administered 2015-11-13: 5 mg via ORAL
  Filled 2015-11-13: qty 1

## 2015-11-13 MED ORDER — IPRATROPIUM-ALBUTEROL 0.5-2.5 (3) MG/3ML IN SOLN
3.0000 mL | Freq: Three times a day (TID) | RESPIRATORY_TRACT | Status: DC
  Administered 2015-11-14 – 2015-11-15 (×5): 3 mL via RESPIRATORY_TRACT
  Filled 2015-11-13 (×5): qty 3

## 2015-11-13 NOTE — Progress Notes (Signed)
Called to pts room by another RN. Noticed that pt was diaphoretic; tachypnic. Pt appeared very anxious, checked O2 sats which were at 92% on RA; HR 110. Pt was pulling at foley and had pulled out IV. Unable to gain new IV access. MD notified. Orders received. Will continue to monitor.

## 2015-11-13 NOTE — Progress Notes (Signed)
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 fib on Coumadin, osteoporosis, pneumothorax status post chest tube, HTN, chronic indwelling Foley catheter for the last 2 months secondary to urinary retention, multiple hospitalizations (4 in the last 2 months), recent admission to [facility] 10/29/15-11/02/15 for acute hypoxic respiratory failure due to acute on chronic diastolic CHF, admitted to [facility] on 11/06/15 with new onset dyspnea, hypoxia and suspected aspiration. She was admitted to stepdown unit for aspiration pneumonia, decompensated diastolic CHF and acute respiratory failure with hypoxia. She was seen in consultation with cardiolology and pulmonary. She is now doing better off Bipap. Palliative care consultation was requested, and [name] saw her. Plan to proceed with SNF for rehab, and to continue with partial code status. She no longer require bipap. She is not SOB.  She was lethargic today.     Rewiew of Systems: Unable.  Past Medical History  Diagnosis Date  . Essential hypertension, benign   . Type 2 diabetes mellitus ([place])   . Osteoporosis 06/2008  . Stroke [facility]) 03/2015    right hemiparesis  . History of parotid cancer     Past Surgical History  Procedure Laterality Date  . Ankle surgery Left 2004    otif  . Abdominal hysterectomy    . Anterior and posterior repair N/A 03/11/2013    Procedure: ANTERIOR (CYSTOCELE) AND POSTERIOR REPAIR (RECTOCELE);  Surgeon: Linda Hedges, DO;  Location: [facility];  Service: Gynecology;  Laterality: N/A;  with sacrospinous ligament fixation bilateral  . Colonoscopy  2009     negative  . Cataract extraction w/phaco Right 12/27/2014    Procedure: CATARACT EXTRACTION PHACO AND INTRAOCULAR LENS PLACEMENT RIGHT EYE;  Surgeon: Tonny Branch, MD;  Location: [facility];  Service: Ophthalmology;  Laterality: Right;  CDE:10.63  . Cataract extraction w/phaco Left 01/13/2015    Procedure: CATARACT EXTRACTION PHACO AND INTRAOCULAR LENS PLACEMENT LEFT EYE;  Surgeon: Tonny Branch, MD;  Location: [facility];  Service: Ophthalmology;  Laterality: Left;  CDE:18.87  . Cholecystectomy    . Tee without cardioversion N/A 04/01/2015    Procedure: TRANSESOPHAGEAL ECHOCARDIOGRAM (TEE);  Surgeon: Sanda [place], MD;  Location: [facility] ENDOSCOPY;  Service: Cardiovascular;  Laterality: N/A;  . Peg placement    . Parotidectomy      Medications:  HOME MEDS: Prior to Admission medications   Medication Sig Start Date End Date Taking? Authorizing Provider  aspirin EC 81 MG EC tablet Take 1 tablet (81 mg total) by mouth daily. 11/02/15  Yes Estela Leonie Green, MD  clotrimazole-betamethasone (LOTRISONE) cream Apply 1 application topically daily as needed (APPLY A THIN LAYER AS BARRIER AFTER CLEANING AND DRYING BUTTOCKS AREA COMPLETELY).   Yes Historical Provider, MD  FLUoxetine (PROZAC) 10 MG capsule TAKE 1 CAPSULE BY MOUTH DAILY FOR DEPRESSION/ANXIETY Patient taking differently: TAKE 1 CAPSULE VIA PEG DAILY FOR DEPRESSION/ANXIETY 09/15/15  Yes [name], MD  furosemide (LASIX) 20 MG tablet 20 mg by PEG Tube route daily.   Yes Historical Provider, MD  levETIRAcetam (KEPPRA) 750 MG tablet TAKE 1 TABLET BY MOUTH EVERY MORNING AND 2 TABLETS BY MOUTH EVERY NIGHT AT BEDTIME Patient taking differently: TAKE 1 TABLET  VIA PEG TUBE IN THE MORNING & 2 TABLETS VIA PEG TUBE EVERY NIGHT AT BEDTIME 08/01/15  Yes [name], MD  loperamide (IMODIUM) 2 MG capsule Take 1 capsule (2 mg total) by mouth every 4 (four) hours as needed for diarrhea or loose stools. 10/21/15  Yes [name], MD  metFORMIN (GLUCOPHAGE) 500 MG  tablet Take one half tablet BID Patient taking differently: 250 mg by PEG Tube route 2 (two) times daily with a meal.  04/25/15  Yes [name], MD  methylphenidate (RITALIN) 5 MG tablet Give one tablet per tube twice daily with breakfast and lunch 11/03/15  Yes [name], DO  metoCLOPramide (REGLAN) 10 MG tablet Place 1 tablet (10 mg total) into feeding tube every 6 (six) hours as needed for nausea. 10/21/15  Yes [name], MD  metoprolol tartrate (LOPRESSOR) 25 MG tablet Take 25 mg by mouth 2 (two) times daily.   Yes Historical Provider, MD  Nutritional Supplements (FEEDING SUPPLEMENT, VITAL AF 1.2 CAL,) LIQD Place 1,000 mLs into feeding tube daily. 10/21/15  Yes [name], MD  nystatin (MYCOSTATIN/NYSTOP) 100000 UNIT/GM POWD Apply topically 3 (three) times daily. **APPLY A DUSTING TO RASH ON PERINEUM AND BUTTOCKS AT [facility] SHIFT   Yes Historical Provider, MD  ondansetron (ZOFRAN ODT) 4 MG disintegrating tablet Take 1 tablet (4 mg total) by mouth every 8 (eight) hours as needed for nausea or vomiting. Patient taking differently: 4 mg by PEG Tube route every 8 (eight) hours as needed for nausea or vomiting.  10/08/15  Yes [name], MD  pravastatin (PRAVACHOL) 40 MG tablet Take 1 tablet (40 mg total) by mouth daily. Patient taking differently: 40 mg by PEG Tube route every evening.  07/20/15  Yes [name], MD  warfarin (COUMADIN) 4 MG tablet Take 4 mg by mouth every evening.   Yes Historical Provider, MD  Water For Irrigation, Sterile (FREE WATER) SOLN Place 200 mLs into feeding tube every 8 (eight) hours. 10/21/15   [name], MD     Allergies:  No Known Allergies  Social History:   reports that she quit smoking about 8 months ago. Her smoking use included Cigarettes. She has a 7.5 pack-year smoking history. She does not have any smokeless tobacco history on file. She reports that she does not drink alcohol or use illicit drugs.  Family History: Family History   Problem Relation Age of Onset  . Cancer Father     Liver  . Cancer Sister     Breast  . Diabetes Brother   . Dementia Mother      Physical Exam: Filed Vitals:   11/12/15 2152 11/13/15 0500 11/13/15 0749 11/13/15 1346  BP: 152/78 139/69    Pulse: 88 77    Temp: 98.2 F (36.8 C) 98.5 F (36.9 C)    TempSrc: Oral Axillary    Resp: 16 20    Height:      Weight:  41.141 kg (90 lb 11.2 oz)    SpO2: 94% 95% 98% 99%   Blood pressure 139/69, pulse 77, temperature 98.5 F (36.9 C), temperature source Axillary, resp. rate 20, height 5\' 6"  (1.676 m), weight 41.141 kg (90 lb 11.2 oz), SpO2 99 %.  GEN:  Pleasant  patient lying in the stretcher in no acute distress; very lethargic today.  PSYCH:  alert and oriented x4; does not appear anxious or depressed; affect is appropriate. HEENT: Mucous membranes pink and anicteric; PERRLA; EOM intact; no cervical lymphadenopathy nor thyromegaly  or carotid bruit; no JVD; There were no stridor. Neck is very supple. Breasts:: Not examined CHEST WALL: No tenderness CHEST: Normal respiration, clear to auscultation bilaterally.  HEART: Regular rate and rhythm.  There are no murmur, rub, or gallops.   BACK: No kyphosis or scoliosis; no CVA tenderness ABDOMEN: soft and non-tender; no masses, no organomegaly, normal abdominal bowel sounds; no pannus; no intertriginous candida. There is no rebound and no distention. Rectal Exam: Not done EXTREMITIES: No bone or joint deformity; age-appropriate arthropathy of the hands and knees; no edema; no ulcerations.  There is no calf tenderness. Genitalia: not examined PULSES: 2+ and symmetric SKIN: Normal hydration no rash or ulceration CNS: Cranial nerves 2-12 grossly intact no focal lateralizing neurologic deficit.  Speech is fluent; uvula elevated with phonation, facial symmetry and tongue midline. DTR are normal bilaterally, cerebella exam is intact, barbinski is negative and strengths are equaled bilaterally.  No  sensory loss.   Labs on Admission:  Basic Metabolic Panel:  Recent Labs Lab 11/08/15 0602 11/08/15 1700 11/09/15 0428 11/11/15 0427 11/12/15 0449 11/13/15 0545  NA 139  --  145 145 142 141  K 3.1*  --  5.3* 3.7 3.8 3.6  CL 103  --  108 107 106 109  CO2 29  --  28 28 29 27   GLUCOSE 186*  --  101* 64* 84 88  BUN 24*  --  28* 39* 33* 31*  CREATININE 0.61  --  0.83 0.89 0.79 0.77  CALCIUM 8.1*  --  8.8* 8.8* 8.9 8.9  MG  --  2.2 2.1 2.5*  --   --     CBC:  Recent Labs Lab 11/07/15 0416 11/08/15 0602 11/09/15 0428  WBC 9.2 9.0 10.4  HGB 7.5* 7.7* 8.4*  HCT 25.1* 24.5* 27.6*  MCV 83.9 81.7 82.6  PLT 572* 554* 627*   Cardiac Enzymes:  Recent Labs Lab 11/08/15 1700 11/08/15 2223 11/09/15 0428  TROPONINI 0.04* 0.03 0.03    CBG:  Recent Labs Lab 11/12/15 2008 11/13/15 0006 11/13/15 0421 11/13/15 0746 11/13/15 1114  GLUCAP 125* 156* 171* 94 141*     Radiological Exams on Admission: Dg Chest Port 1 View  11/13/2015  CLINICAL DATA:  74 year old female with pneumonia EXAM: PORTABLE CHEST 1 VIEW COMPARISON:  Prior chest x-ray 11/09/2015 FINDINGS: Overall, no significant interval change in the appearance of the chest. Persistent cardiomegaly and mild pulmonary edema. There are moderate bilateral layering pleural effusions with associated bibasilar opacities. No pneumothorax. No support apparatus. IMPRESSION: No significant interval change in the appearance of the chest compared to 11/09/2015. Stable cardiomegaly, mild pulmonary edema and moderate bilateral layering effusions with associated bibasilar atelectasis. Superimposed infiltrate in the lung bases would be difficult to exclude radiographically. Electronically Signed   By: Jacqulynn Cadet M.D.   On: 11/13/2015 08:54   Assessment/Plan Present on Admission:  . (Resolved) Respiratory failure ([place]) . Acute on chronic diastolic CHF (congestive heart failure) ([place]) . Acute respiratory failure with hypoxia  ([place]) . 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 Aspiration pneumonia ([place]) . Dyslipidemia . Essential hypertension, benign . Malnutrition of moderate degree  PLAN:  Aspiration pneumonia, tube feeds/gastric contents, recurrent - As per family, patient apparently found supine in bed at SNF which would contribute to aspiration. Patient to be nursed head up while on tube feeds. Was initially Tx with Van/Zosyn, now on Augmentin.- Blood cultures 2 negative to date. Today, she was lethargic.  ABG was unremarkable.  Will CT her  head if she is not more alert, given she has been anticoagulated.  Other possibility included seizure.   Acute on chronic diastolic CHF - Changed Lasix to 40 mg IV every 12 hourly on admission. - BNP on admission: 3854 - clinically improved but intermittently SOB suggesting persistent mild CHF.  Acute respiratory failure with hypoxia - Secondary to aspiration pneumonia and decompensated CHF. Steroid was given, now discontinued. - hypoxia resolved.   Hypokalemia - Secondary to diuresis. Replaced  Essential hypertension - Controlled. Resumed home dose of metoprolol.  Type II DM - Sliding-scale insulin. Controlled.  Chronic atrial fibrillation - Continue Coumadin per pharmacy and metoprolol. INR 11/21: 3.38.  Dysphagia status post PEG tube - Resumed tube feeds. Dietitian consultation for tube feed management.  - Speech therapy evaluation appreciated. Recommend dysphagia 1 diet and honey thickened liquids.  Urinary retention - Has had chronic indwelling Foley catheter for the last 2 months. Last changed on 11/19 at SNF. Outpatient urology consultation.  CVA with residual right hemiparesis - Continue Coumadin anticoagulation. PT and OT evaluation  Possible seizure disorder - Continue Keppra.    Chronic anemia - Hemoglobin has dropped from 8.5 on 11/20 to 7.5 on 11/21. Hemoglobin has been fluctuating in the 7. 5-8 range over the last several  days. No reported bleeding. Follow CBC in a.m. Transfuse PRBCs if hemoglobin less than 7 g per DL. - Hemoglobin stable in the mid 7 g per DL range.   Left parotidectomy for malignant tumor  Thrombocytosis, possibly reactive - Improving.  Prolonged QTC - Repeat EKG: QTC 5:15 milliseconds. Magnesium: 2.2. Minimize QT prolonging medications. -Unclear etiology. Replace potassium today. Continue monitoring on telemetry. Follow EKG in a.m.   Recently treated Escherichia coli UTI  Adult failure to thrive  - Had extensive discussion with patient's spouse at bedside on 11/22. Patient has had a complicated  course since her surgery early October 2016. Palliative care's consult noted and appreciated it.   Other plans as per orders.  Code Status: Partial ( no code blue, no intubation, no CPR. Cardiac Meds and Seward Meth, MD. [facility] Pager [phone] 7pm to 7am.  11/13/2015, 2:01 PM

## 2015-11-13 NOTE — Progress Notes (Signed)
[place] for Coumadin Indication: atrial fibrillation  No Known Allergies  Patient Measurements: Height: 5\' 6"  (167.6 cm) Weight: 90 lb 11.2 oz (41.141 kg) IBW/kg (Calculated) : 59.3kg  Vital Signs: Temp: 98.5 F (36.9 C) (11/27 0500) Temp Source: Axillary (11/27 0500) BP: 139/69 mmHg (11/27 0500) Pulse Rate: 77 (11/27 0500)  Labs:  Recent Labs  11/11/15 0427 11/12/15 0449 11/13/15 0545  LABPROT 25.9* 20.0* 17.9*  INR 2.41* 1.71* 1.47  CREATININE 0.89 0.79 0.77   Estimated Creatinine Clearance: 40 mL/min (by C-G formula based on Cr of 0.77).  Medical History: Past Medical History  Diagnosis Date  . Essential hypertension, benign   . Type 2 diabetes mellitus ([place])   . Osteoporosis 06/2008  . Stroke [facility]) 03/2015    right hemiparesis  . History of parotid cancer    Assessment: Chronic afib on coumadin. INR is unstable, has trended down to SUBtherapeutic level after being elevated for a few days.  No bleeding reported.  Home dose reportedly 4mg  daily but doses have been reduced this admission due to elevated INR's.  Pt receiving tube feedings.  Small, elderly lady.   Goal of Therapy:  INR 2-3 Monitor platelets by anticoagulation protocol: Yes   Plan:  Coumadin 5mg  po today x 1 (to boost INR) Daily PT/INR Monitor for S/S of bleeding  Thanks for allowing pharmacy to be a part of this patient's care.  Hart Robinsons, PharmD Clinical Pharmacist 11/13/2015,10:01 AM

## 2015-11-14 ENCOUNTER — Encounter (HOSPITAL_COMMUNITY): Payer: Self-pay | Admitting: Cardiology

## 2015-11-14 ENCOUNTER — Inpatient Hospital Stay (HOSPITAL_COMMUNITY): Payer: Medicare HMO

## 2015-11-14 DIAGNOSIS — R9431 Abnormal electrocardiogram [ECG] [EKG]: Secondary | ICD-10-CM

## 2015-11-14 DIAGNOSIS — J690 Pneumonitis due to inhalation of food and vomit: Principal | ICD-10-CM

## 2015-11-14 DIAGNOSIS — R400 Somnolence: Secondary | ICD-10-CM

## 2015-11-14 LAB — GLUCOSE, CAPILLARY
GLUCOSE-CAPILLARY: 144 mg/dL — AB (ref 65–99)
GLUCOSE-CAPILLARY: 183 mg/dL — AB (ref 65–99)
Glucose-Capillary: 119 mg/dL — ABNORMAL HIGH (ref 65–99)
Glucose-Capillary: 89 mg/dL (ref 65–99)
Glucose-Capillary: 93 mg/dL (ref 65–99)
Glucose-Capillary: 122 mg/dL — ABNORMAL HIGH (ref 65–99)

## 2015-11-14 LAB — BASIC METABOLIC PANEL
ANION GAP: 5 (ref 5–15)
BUN: 33 mg/dL — ABNORMAL HIGH (ref 6–20)
CO2: 27 mmol/L (ref 22–32)
Calcium: 8.7 mg/dL — ABNORMAL LOW (ref 8.9–10.3)
Chloride: 110 mmol/L (ref 101–111)
Creatinine, Ser: 0.89 mg/dL (ref 0.44–1.00)
GFR calc Af Amer: >60 mL/min (ref >60)
Glucose, Bld: 154 mg/dL — ABNORMAL HIGH (ref 65–99)
POTASSIUM: 3.7 mmol/L (ref 3.5–5.1)
SODIUM: 142 mmol/L (ref 135–145)

## 2015-11-14 LAB — PROTIME-INR
INR: 1.47 (ref 0.00–1.49)
Prothrombin Time: 17.9 seconds — ABNORMAL HIGH (ref 11.6–15.2)

## 2015-11-14 MED ORDER — WARFARIN SODIUM 2 MG PO TABS
4.0000 mg | ORAL_TABLET | Freq: Once | ORAL | Status: AC
  Administered 2015-11-14: 4 mg via ORAL
  Filled 2015-11-14: qty 2

## 2015-11-14 MED ORDER — VITAL AF 1.2 CAL PO LIQD
1000.0000 mL | ORAL | Status: DC
  Administered 2015-11-14: 1000 mL

## 2015-11-14 MED ORDER — LORAZEPAM 2 MG/ML IJ SOLN
0.5000 mg | Freq: Once | INTRAMUSCULAR | Status: AC
  Administered 2015-11-14: 0.5 mg via INTRAVENOUS
  Filled 2015-11-14: qty 1

## 2015-11-14 MED ORDER — ONDANSETRON HCL 4 MG/2ML IJ SOLN
4.0000 mg | Freq: Four times a day (QID) | INTRAMUSCULAR | Status: DC | PRN
  Administered 2015-11-14: 4 mg via INTRAVENOUS
  Filled 2015-11-14: qty 2

## 2015-11-14 NOTE — Clinical Social Work Note (Signed)
CSW updated [facility] on pt. Facility has started authorization for return. Pt not medically stable for d/c per MD.  Laura Melendez, [place]

## 2015-11-14 NOTE — Progress Notes (Signed)
Primary cardiologist: Dr. Satira Sark  Seen for followup: Abnormal ECG, diastolic heart failure  Subjective:    Moves somewhat to voice, but no verbal communication.  Objective:   Temp:  [98.4 F (36.9 C)] 98.4 F (36.9 C) (11/28 [id number]) Pulse Rate:  [78-90] 78 (11/28 0608) Resp:  [20] 20 (11/28 0608) BP: (132-144)/(66-89) 133/89 mmHg (11/28 0608) SpO2:  [95 %-100 %] 98 % (11/28 0805) Last BM Date: 11/12/15  Filed Weights   11/11/15 0500 11/12/15 0623 11/13/15 0500  Weight: 90 lb 9.7 oz (41.1 kg) 88 lb 6.5 oz (40.1 kg) 90 lb 11.2 oz (41.141 kg)    Intake/Output Summary (Last 24 hours) at 11/14/15 0858 Last data filed at 11/14/15 0609  Gross per 24 hour  Intake      0 ml  Output   1000 ml  Net  -1000 ml    Telemetry: Sinus rhythm with PACs.  Exam:  General: Frail, cachectic woman in no obvious distress.  HEENT: Poor dentition. No elevated JVP.  Lungs: Diminished breath sounds at the bases. Scattered rhonchi.  Cardiac: RRR without gallop.  Abdomen: PEG tube.  Extremities: No edema.  Lab Results:  Basic Metabolic Panel:  Recent Labs Lab 11/08/15 1700 11/09/15 0428 11/11/15 0427 11/12/15 0449 11/13/15 0545 11/14/15 0649  NA  --  145 145 142 141 142  K  --  5.3* 3.7 3.8 3.6 3.7  CL  --  108 107 106 109 110  CO2  --  28 28 29 27 27   GLUCOSE  --  101* 64* 84 88 154*  BUN  --  28* 39* 33* 31* 33*  CREATININE  --  0.83 0.89 0.79 0.77 0.89  CALCIUM  --  8.8* 8.8* 8.9 8.9 8.7*  MG 2.2 2.1 2.5*  --   --   --     CBC:  Recent Labs Lab 11/08/15 0602 11/09/15 0428  WBC 9.0 10.4  HGB 7.7* 8.4*  HCT 24.5* 27.6*  MCV 81.7 82.6  PLT 554* 627*    Cardiac Enzymes:  Recent Labs Lab 11/08/15 1700 11/08/15 2223 11/09/15 0428  TROPONINI 0.04* 0.03 0.03    Coagulation:  Recent Labs Lab 11/12/15 0449 11/13/15 0545 11/14/15 0649  INR 1.71* 1.47 1.47    Chest x-ray 11/14/2015: FINDINGS: There is unchanged cardiomegaly. Central and  basilar ground-glass opacities persist, likely alveolar edema. Pleural effusions persist bilaterally. No pneumothorax.  IMPRESSION: Unchanged, with persisting cardiomegaly, pleural effusions and alveolar edema.   Echocardiogram 11/09/2015: Study Conclusions  - Left ventricle: The cavity size was normal. Wall thickness was increased in a pattern of mild LVH. Systolic function was normal. The estimated ejection fraction was in the range of 50% to 55%. Indeterminate diastolic function. Wall motion was normal; there were no regional wall motion abnormalities. - Aortic valve: Mildly calcified annulus. Trileaflet; mildly thickened leaflets. Valve area (VTI): 1.9 cm^2. Valve area (Vmax): 1.87 cm^2. - Left atrium: The atrium was severely dilated. - Pericardium, extracardiac: There is a large left pleural effusion. - Technically adequate study.   Medications:   Scheduled Medications: . amoxicillin-clavulanate  1 tablet Per Tube Q12H  . antiseptic oral rinse  7 mL Mouth Rinse q12n4p  . antiseptic oral rinse  7 mL Mouth Rinse BID  . aspirin  81 mg Per Tube Daily  . chlorhexidine  15 mL Mouth Rinse BID  . free water  200 mL Per Tube 3 times per day  . furosemide  20 mg Per Tube Daily  .  insulin aspart  0-9 Units Subcutaneous 6 times per day  . ipratropium-albuterol  3 mL Nebulization TID  . levETIRAcetam  1,500 mg Per Tube QHS  . levETIRAcetam  750 mg Per Tube Daily  . methylphenidate  5 mg Per Tube BID WC  . metoprolol tartrate  25 mg Per Tube BID  . pravastatin  40 mg Per Tube QPM  . sodium chloride  3 mL Intravenous Q12H  . Warfarin - Pharmacist Dosing Inpatient   Does not apply Q24H     Infusions: . feeding supplement (VITAL AF 1.2 CAL) 1,000 mL (11/13/15 0400)     PRN Medications:  acetaminophen **OR** acetaminophen, albuterol, guaiFENesin-dextromethorphan, ondansetron (ZOFRAN) IV   Assessment:   1. Abnormal ECG - deep T wave inversion and prolonged  QTc. No chest pain, but ischemia to be considered (no obvious acute CNS event and e-lytes now normal). Echocardiogram shows LVEF 50-55% and no wall motion abnormalities. Managing conservatively at this point.  2. 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 Aortic atherosclerosis and mural thrombus noted, and has been on Coumadin since then. No history of atrial fibrillation.  3. Acute on chronic diastolic heart failure. Has diuresed about 4500 cc during hospital stay. On Lasix per PEG.  4. Failure to thrive with recurring aspiration pneumonia. Has had declining course in general. Palliative Care has evaluated and patient currently has limited code status.   Plan/Discussion:    Reviewed medications. Continue ASA for now (with INR 1.4), Coumadin per pharmacy, lopressor, and Zocor. Still has pleural effusion and edema by CXR (question changes contributed to by aspiration as well). Continue Lasix at low dose - can adjust up if needed, but diuresing reasonably well. Repeat ECG - anticipate conservative management with no further ischemic testing however.   Satira Sark, M.D., F.A.C.C.

## 2015-11-14 NOTE — Progress Notes (Signed)
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 fib on Coumadin, osteoporosis, pneumothorax status post chest tube, HTN, chronic indwelling Foley catheter for the last 2 months secondary to urinary retention, multiple hospitalizations (4 in the last 2 months), recent admission to [facility] 10/29/15-11/02/15 for acute hypoxic respiratory failure due to acute on chronic diastolic CHF, admitted to [facility] on 11/06/15 with new onset dyspnea, hypoxia and suspected aspiration. She was admitted to stepdown unit for aspiration pneumonia, decompensated diastolic CHF and acute respiratory failure with hypoxia. She was seen in consultation with cardiolology and pulmonary. She is now doing better off Bipap. Palliative care consultation was requested, and [name] saw her. Plan to proceed with SNF for rehab, and to continue with partial code status. She no longer require bipap. She is not SOB. She was lethargic so ABG was done and it was OK, CT head was negative, and reviewed her med list showed no sedative meds, except the Keppra.   Rewiew of Systems:  Constitutional: Negative for malaise, fever and chills. No significant weight loss or weight gain Eyes: Negative for eye pain, redness and discharge, diplopia, visual changes, or flashes of light. ENMT: Negative for ear pain, hoarseness, nasal congestion, sinus pressure and sore throat. No headaches; tinnitus, drooling, or problem swallowing. Cardiovascular: Negative for chest pain, palpitations, diaphoresis, dyspnea and peripheral edema. ; No orthopnea, PND Respiratory: Negative for cough, hemoptysis,  wheezing and stridor. No pleuritic chestpain. Gastrointestinal: Negative for nausea, vomiting, diarrhea, constipation, abdominal pain, melena, blood in stool, hematemesis, jaundice and rectal bleeding.    Genitourinary: Negative for frequency, dysuria, incontinence,flank pain and hematuria; Musculoskeletal: Negative for back pain and neck pain. Negative for swelling and trauma.;  Skin: . Negative for pruritus, rash, abrasions, bruising and skin lesion.; ulcerations Neuro: Negative for headache, lightheadedness and neck stiffness. Negative for weakness, altered level of consciousness , altered mental status, extremity weakness, burning feet, involuntary movement, seizure and syncope.  Psych: negative for anxiety, depression, insomnia, tearfulness, panic attacks, hallucinations, paranoia, suicidal or homicidal ideation    Past Medical History  Diagnosis Date  . Essential hypertension, benign   . Type 2 diabetes mellitus ([place])   . Osteoporosis 06/2008  . Stroke [facility]) 03/2015    Rght hemiparesis  . History of parotid cancer     Past Surgical History  Procedure Laterality Date  . Ankle surgery Left 2004    otif  . Abdominal hysterectomy    . Anterior and posterior repair N/A 03/11/2013    Procedure: ANTERIOR (CYSTOCELE) AND POSTERIOR REPAIR (RECTOCELE);  Surgeon: Linda Hedges, DO;  Location: [place] [facility];  Service: Gynecology;  Laterality: N/A;  with sacrospinous ligament fixation bilateral  . Colonoscopy  2009    negative  . Cataract extraction w/phaco Right 12/27/2014    Procedure: CATARACT EXTRACTION PHACO AND INTRAOCULAR LENS PLACEMENT RIGHT EYE;  Surgeon: Tonny Branch, MD;  Location: [facility];  Service: Ophthalmology;  Laterality: Right;  CDE:10.63  . Cataract extraction w/phaco Left 01/13/2015    Procedure: CATARACT EXTRACTION PHACO AND INTRAOCULAR LENS PLACEMENT LEFT EYE;  Surgeon: Tonny Branch, MD;  Location: [facility];  Service: Ophthalmology;  Laterality: Left;  CDE:18.87  .  Cholecystectomy    .  Tee without cardioversion N/A 04/01/2015    Procedure: TRANSESOPHAGEAL ECHOCARDIOGRAM (TEE);  Surgeon: Sanda Klein, MD;  [facility] END[facility]COPY;  Service: Cardiovascular;  Laterality: N/A;  . Peg placement    . Parotidectomy      Medications:  HOME MEDS: Prior to Admission medications   Medication Sig Start Date End Date Taking? Authorizing Provider  aspirin EC 81 MG EC tablet Take 1 tablet (81 mg total) by mouth daily. 11/02/15  Yes Estela Leonie Green, MD  clotrimazole-betamethasone (LOTRISONE) cream Apply 1 application topically daily as needed (APPLY A THIN LAYER AS BARRIER AFTER CLEANING AND DRYING BUTTOCKS AREA COMPLETELY).   Yes Historical Provider, MD  FLUoxetine (PROZAC) 10 MG capsule TAKE 1 CAPSULE BY MOUTH DAILY FOR DEPRESSION/ANXIETY Patient taking differently: TAKE 1 CAPSULE VIA PEG DAILY FOR DEPRESSION/ANXIETY 09/15/15  Yes [name], MD  furosemide (LASIX) 20 MG tablet 20 mg by PEG Tube route daily.   Yes Historical Provider, MD  levETIRAcetam (KEPPRA) 750 MG tablet TAKE 1 TABLET BY MOUTH EVERY MORNING AND 2 TABLETS BY MOUTH EVERY NIGHT AT BEDTIME Patient taking differently: TAKE 1 TABLET VIA PEG TUBE IN THE MORNING & 2 TABLETS VIA PEG TUBE EVERY NIGHT AT BEDTIME 08/01/15  Yes [name], MD  loperamide (IMODIUM) 2 MG capsule Take 1 capsule (2 mg total) by mouth every 4 (four) hours as needed for diarrhea or loose stools. 10/21/15  Yes [name], MD  metFORMIN (GLUCOPHAGE) 500 MG tablet Take one half tablet BID Patient taking differently: 250 mg by PEG Tube route 2 (two) times daily with a meal.  04/25/15  Yes [name], MD  methylphenidate (RITALIN) 5 MG tablet Give one tablet per tube twice daily with breakfast and lunch 11/03/15  Yes [name], DO  metoCLOPramide (REGLAN) 10 MG tablet Place 1 tablet (10 mg total) into feeding tube every 6 (six) hours as needed for nausea. 10/21/15  Yes [name], MD  metoprolol tartrate (LOPRESSOR) 25 MG tablet Take  25 mg by mouth 2 (two) times daily.   Yes Historical Provider, MD  Nutritional Supplements (FEEDING SUPPLEMENT, VITAL AF 1.2 CAL,) LIQD Place 1,000 mLs into feeding tube daily. 10/21/15  Yes [name], MD  nystatin (MYCOSTATIN/NYSTOP) 100000 UNIT/GM POWD Apply topically 3 (three) times daily. **APPLY A DUSTING TO RASH ON PERINEUM AND BUTTOCKS AT [facility] SHIFT   Yes Historical Provider, MD  ondansetron (ZOFRAN ODT) 4 MG disintegrating tablet Take 1 tablet (4 mg total) by mouth every 8 (eight) hours as needed for nausea or vomiting. Patient taking differently: 4 mg by PEG Tube route every 8 (eight) hours as needed for nausea or vomiting.  10/08/15  Yes [name], MD  pravastatin (PRAVACHOL) 40 MG tablet Take 1 tablet (40 mg total) by mouth daily. Patient taking differently: 40 mg by PEG Tube route every evening.  07/20/15  Yes [name], MD  warfarin (COUMADIN) 4 MG tablet Take 4 mg by mouth every evening.   Yes Historical Provider, MD  Water For Irrigation, Sterile (FREE WATER) SOLN Place 200 mLs into feeding tube every 8 (eight) hours. 10/21/15   [name], MD     Allergies:  No Known Allergies  Social History:   reports that she quit smoking about 8 months ago. Her smoking use included Cigarettes. She has a 7.5 pack-year smoking history. She does not have any smokeless tobacco history on file. She reports that she does not drink alcohol or use  illicit drugs.  Family History: Family History  Problem Relation Age of Onset  . Cancer Father     Liver  . Cancer Sister     Breast  . Diabetes Brother   . Dementia Mother      Physical Exam: Filed Vitals:   11/14/15 0447 11/14/15 0608 11/14/15 0805 11/14/15 1455  BP:  133/89    Pulse:  78    Temp:  98.4 F (36.9 C)    TempSrc:  Axillary    Resp:  20    Height:      Weight:      SpO2: 99% 95% 98% 96%   Blood pressure 133/89, pulse 78, temperature 98.4 F (36.9 C), temperature source Axillary, resp. rate 20, height 5\' 6"   (1.676 m), weight 41.141 kg (90 lb 11.2 oz), SpO2 96 %.  GEN:  Pleasant  patient lying in the stretcher in no acute distress; cooperative with exam. PSYCH:  alert and oriented x4; does not appear anxious or depressed; affect is appropriate. HEENT: Mucous membranes pink and anicteric; PERRLA; EOM intact; no cervical lymphadenopathy nor thyromegaly or carotid bruit; no JVD; There were no stridor. Neck is very supple. Breasts:: Not examined CHEST WALL: No tenderness CHEST: Normal respiration, clear to auscultation bilaterally.  HEART: Regular rate and rhythm.  There are no murmur, rub, or gallops.   BACK: No kyphosis or scoliosis; no CVA tenderness ABDOMEN: soft and non-tender; no masses, no organomegaly, normal abdominal bowel sounds; no pannus; no intertriginous candida. There is no rebound and no distention. Rectal Exam: Not done EXTREMITIES: No bone or joint deformity; age-appropriate arthropathy of the hands and knees; no edema; no ulcerations.  There is no calf tenderness. Genitalia: not examined PULSES: 2+ and symmetric SKIN: Normal hydration no rash or ulceration CNS: Cranial nerves 2-12 grossly intact no focal lateralizing neurologic deficit.  Speech is fluent; uvula elevated with phonation, facial symmetry and tongue midline. DTR are normal bilaterally, cerebella exam is intact, barbinski is negative and strengths are equaled bilaterally.  No sensory loss.   Labs on Admission:  Basic Metabolic Panel:  Recent Labs Lab 11/08/15 1700 11/09/15 0428 11/11/15 0427 11/12/15 0449 11/13/15 0545 11/14/15 0649  NA  --  145 145 142 141 142  K  --  5.3* 3.7 3.8 3.6 3.7  CL  --  108 107 106 109 110  CO2  --  28 28 29 27 27   GLUCOSE  --  101* 64* 84 88 154*  BUN  --  28* 39* 33* 31* 33*  CREATININE  --  0.83 0.89 0.79 0.77 0.89  CALCIUM  --  8.8* 8.8* 8.9 8.9 8.7*  MG 2.2 2.1 2.5*  --   --   --    CBC:  Recent Labs Lab 11/08/15 0602 11/09/15 0428  WBC 9.0 10.4  HGB 7.7* 8.4*   HCT 24.5* 27.6*  MCV 81.7 82.6  PLT 554* 627*   Cardiac Enzymes:  Recent Labs Lab 11/08/15 1700 11/08/15 2223 11/09/15 0428  TROPONINI 0.04* 0.03 0.03    CBG:  Recent Labs Lab 11/13/15 2005 11/14/15 0113 11/14/15 0447 11/14/15 0741 11/14/15 1154  GLUCAP 134* 144* 183* 119* 89     Radiological Exams on Admission: Ct Head Wo Contrast  11/13/2015  CLINICAL DATA:  Altered mental status, lethargy EXAM: CT HEAD WITHOUT CONTRAST TECHNIQUE: Contiguous axial images were obtained from the base of the skull through the vertex without intravenous contrast. COMPARISON:  10/13/2015 FINDINGS: No skull fracture is noted. No  intracranial hemorrhage, mass effect or midline shift. No acute cortical infarction. No mass lesion is noted on this unenhanced scan. The gray and white-matter differentiation is preserved. Stable cerebral atrophy. Extensive periventricular and patchy subcortical chronic white matter disease again noted. IMPRESSION: No acute intracranial abnormality. Stable atrophy and extensive chronic white matter disease. Electronically Signed   By: Lahoma Crocker M.D.   On: 11/13/2015 15:13   Dg Chest Port 1 View  11/14/2015  CLINICAL DATA:  Aspiration EXAM: PORTABLE CHEST 1 VIEW COMPARISON:  11/13/2015 FINDINGS: There is unchanged cardiomegaly. Central and basilar ground-glass opacities persist, likely alveolar edema. Pleural effusions persist bilaterally. No pneumothorax. IMPRESSION: Unchanged, with persisting cardiomegaly, pleural effusions and alveolar edema Electronically Signed   By: Andreas Newport M.D.   On: 11/14/2015 06:42   Dg Chest Port 1 View  11/13/2015  CLINICAL DATA:  74 year old female with pneumonia EXAM: PORTABLE CHEST 1 VIEW COMPARISON:  Prior chest x-ray 11/09/2015 FINDINGS: Overall, no significant interval change in the appearance of the chest. Persistent cardiomegaly and mild pulmonary edema. There are moderate bilateral layering pleural effusions with associated  bibasilar opacities. No pneumothorax. No support apparatus. IMPRESSION: No significant interval change in the appearance of the chest compared to 11/09/2015. Stable cardiomegaly, mild pulmonary edema and moderate bilateral layering effusions with associated bibasilar atelectasis. Superimposed infiltrate in the lung bases would be difficult to exclude radiographically. Electronically Signed   By: Jacqulynn Cadet M.D.   On: 11/13/2015 08:54   Assessment/Plan Present on Admission:  . (Resolved) Respiratory failure ([place]) . Acute on chronic diastolic CHF (congestive heart failure) ([place]) . Acute respiratory failure with hypoxia ([place]) . 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 Aspiration pneumonia ([place]) . Dyslipidemia . Essential hypertension, benign . Malnutrition of moderate degree  PLAN:   Aspiration pneumonia, tube feeds/gastric contents, recurrent - As per family, patient apparently found supine in bed at SNF which would contribute to aspiration. Patient to be nursed head up while on tube feeds. Was initially Tx with Van/Zosyn, now on Augmentin.- Blood cultures 2 negative to date. Today, she was lethargic. ABG was unremarkable. Head CT was negative.   Acute on chronic diastolic CHF - Changed Lasix to 40 mg IV every 12 hourly on admission. - BNP on admission: 3854 - clinically improved but intermittently SOB suggesting persistent mild CHF.  Acute respiratory failure with hypoxia - Secondary to aspiration pneumonia and decompensated CHF. Steroid was given, now discontinued. - hypoxia resolved.   Hypokalemia - Secondary to diuresis. Replaced  Essential hypertension - Controlled. Resumed home dose of metoprolol.  Type II DM - Sliding-scale insulin. Controlled.  Chronic atrial fibrillation - Continue Coumadin per pharmacy and metoprolol. INR   Dysphagia status post PEG tube - Resumed tube feeds. Dietitian consultation for tube feed management.  - Speech therapy  evaluation appreciated. Recommend dysphagia 1 diet and honey thickened liquids.  Urinary retention - Has had chronic indwelling Foley catheter for the last 2 months. Last changed on 11/19 at SNF.   CVA with residual right hemiparesis - Continue Coumadin anticoagulation. PT and OT evaluation  Possible seizure disorder - Continue Keppra.   Chronic anemia - Hemoglobin has dropped from 8.5 on 11/20 to 7.5 on 11/21. Hemoglobin has been fluctuating in the 7. 5-8 range over the last several days. No reported bleeding. Follow CBC in a.m. Transfuse PRBCs if hemoglobin less than 7 g per DL. - Hemoglobin stable in the mid 7 g per DL range.   Left parotidectomy for malignant tumor  Thrombocytosis, possibly reactive - Improving.  Prolonged QTC - Repeat EKG: QTC 5:15 milliseconds. Magnesium: 2.2. Minimize QT prolonging medications. -Unclear etiology. Replace potassium today. Continue monitoring on telemetry. Follow EKG in a.m.   Recently treated Escherichia coli UTI  Adult failure to thrive  - Had extensive discussion with patient's spouse at bedside on 11/22. Patient has had a complicated  course since her surgery early October 2016. Palliative care's consult noted and appreciated it.   Other plans as per orders.  Code Status: Partial ( no code blue, no intubation, no CPR. Cardiac Meds and [place] Meth, MD. [facility] Pager [phone] 7pm to 7am.  11/14/2015, 5:01 PM

## 2015-11-14 NOTE — Progress Notes (Signed)
Received orders from MD.  Will carry out orders and continue to monitor patient.

## 2015-11-14 NOTE — Progress Notes (Signed)
ANTICOAGULATION CONSULT NOTE   Pharmacy Consult for Coumadin Indication: atrial fibrillation  No Known Allergies  Patient Measurements: Height: 5\' 6"  (167.6 cm) Weight: 90 lb 11.2 oz (41.141 kg) IBW/kg (Calculated) : 59.3kg  Vital Signs: Temp: 98.4 F (36.9 C) (11/28 0608) Temp Source: Axillary (11/28 0608) BP: 133/89 mmHg (11/28 0608) Pulse Rate: 78 (11/28 0608)  Labs:  Recent Labs  11/12/15 0449 11/13/15 0545 11/14/15 0649  LABPROT 20.0* 17.9* 17.9*  INR 1.71* 1.47 1.47  CREATININE 0.79 0.77 0.89   Estimated Creatinine Clearance: 36 mL/min (by C-G formula based on Cr of 0.89).  Medical History: Past Medical History  Diagnosis Date  . Essential hypertension, benign   . Type 2 diabetes mellitus ([place])   . Osteoporosis 06/2008  . Stroke [facility]) 03/2015    Rght hemiparesis  . History of parotid cancer    Assessment: Chronic afib on coumadin. INR is unstable, has trended down to SUBtherapeutic level after being elevated for a few days.  No bleeding reported.  Home dose reportedly 4mg  daily but doses have been reduced this admission due to elevated INR's.  Pt receiving tube feedings.  Small, elderly lady.   Goal of Therapy:  INR 2-3 Monitor platelets by anticoagulation protocol: Yes   Plan:  Coumadin 4mg  po today x 1  Daily PT/INR Monitor for S/S of bleeding  Thanks for allowing pharmacy to be a part of this patient's care.  Excell Seltzer, PharmD Clinical Pharmacist  11/14/2015,12:06 PM

## 2015-11-14 NOTE — Progress Notes (Signed)
Nutrition Follow-Up  INTERVENTION:  Resume Vital 1.2 ml/hr @ 25 ml/hr and advance 10 ml /hr every 4 hrs  to goal rate 45 ml/hr.   Provides 1296  kcal, 81 gr protein and 875 ml water. Meets100% energy and 100% protein needs.  Free water -200 ml TID and 30 ml water before and after medications.     NUTRITION DIAGNOSIS:  Predicted suboptimal oral nutrient intake related to swallow difficulty: ongoing. Pt PEG providing 100% of nutriton.  GOAL: Provide nutrition needs based on ASPEN/SCCM guidelines; met    MONITOR: TF tolerance, Labs, Weight trends, Skin   REASON FOR ASSESSMENT: Enteral feeding/NPO    ASSESSMENT:  Pt vomited this morning around 5 am and tube feeding has been on hold since. Discussed with her nurse who says no further vomiting episodes today and there were no acute changes in chest x-ray (no indication of aspiration).  Her diarrhea has improved. No BM since 11/26.  Talked with MD and we'll resume her tube feeding at a lower rate. Will follow up with her tomorrow. Re-assessed her nutrient needs and adjusted tube feeding goal.  Abnormal Labs: BUN-33, glucose 154 mg/dl.  Mag. 2.5 (11/25)   Diet Order:  Diet NPO time specified  Skin:   no new concerns   Last BM: 11/26   Height:   Ht Readings from Last 1 Encounters:  11/06/15 5' 6" (1.676 m)    Weight:   Wt Readings from Last 1 Encounters:  11/13/15 90 lb 11.2 oz (41.141 kg)    Ideal Body Weight:   59 kg  BMI:  Body mass index is 14.65 kg/(m^2). underweight  Re-Estimated Nutritional Needs:   Kcal:  0240-9735  Protein:   60-70 gr  Fluid:   1.3 liters daily  EDUCATION NEEDS: none at this time     Colman Cater MS,RD,CSG,LDN Office: #[phone] Pager: [phone]

## 2015-11-14 NOTE — Progress Notes (Signed)
Respiratory called staff to room.  Patient in bed, confused.  Patient had vomited over gown and into bed.  Patient alert and at baseline confusion from earlier in shift.  Patient oxygen saturation in the 90s on 2 liters of oxygen, pulse is tachycardic in the 120s and blood pressure elevated.  Patient appears pale and diaphoretic.  Patient clean, breathing treatment given vitals taken.  Notified MD of patient condition. Awaiting orders at this time.

## 2015-11-15 DIAGNOSIS — Z7189 Other specified counseling: Secondary | ICD-10-CM

## 2015-11-15 DIAGNOSIS — E785 Hyperlipidemia, unspecified: Secondary | ICD-10-CM

## 2015-11-15 LAB — GLUCOSE, CAPILLARY
GLUCOSE-CAPILLARY: 105 mg/dL — AB (ref 65–99)
GLUCOSE-CAPILLARY: 138 mg/dL — AB (ref 65–99)
Glucose-Capillary: 127 mg/dL — ABNORMAL HIGH (ref 65–99)
Glucose-Capillary: 130 mg/dL — ABNORMAL HIGH (ref 65–99)
Glucose-Capillary: 141 mg/dL — ABNORMAL HIGH (ref 65–99)

## 2015-11-15 LAB — CBC
HCT: 27 % — ABNORMAL LOW (ref 36.0–46.0)
Hemoglobin: 8.2 g/dL — ABNORMAL LOW (ref 12.0–15.0)
MCH: 25.1 pg — ABNORMAL LOW (ref 26.0–34.0)
MCHC: 30.4 g/dL (ref 30.0–36.0)
MCV: 82.6 fL (ref 78.0–100.0)
PLATELETS: 497 10*3/uL — AB (ref 150–400)
RBC: 3.27 MIL/uL — AB (ref 3.87–5.11)
RDW: 16.3 % — AB (ref 11.5–15.5)
WBC: 8.1 10*3/uL (ref 4.0–10.5)

## 2015-11-15 LAB — PROTIME-INR
INR: 1.67 — AB (ref 0.00–1.49)
Prothrombin Time: 19.7 seconds — ABNORMAL HIGH (ref 11.6–15.2)

## 2015-11-15 MED ORDER — WARFARIN SODIUM 2 MG PO TABS: 4.0000 mg | ORAL_TABLET | Freq: Once | ORAL | Status: DC

## 2015-11-15 MED ORDER — ALBUTEROL SULFATE (2.5 MG/3ML) 0.083% IN NEBU: 2.5000 mg | INHALATION_SOLUTION | RESPIRATORY_TRACT | Status: DC | PRN

## 2015-11-15 MED ORDER — PRAVASTATIN SODIUM 40 MG PO TABS: 40.0000 mg | ORAL_TABLET | Freq: Every evening | ORAL | Status: DC

## 2015-11-15 NOTE — Clinical Social Work Note (Signed)
Pt d/c today back to [facility]. Pt, pt's husband, and facility aware and agreeable. Will transfer with staff. No FL2 needed per [facility]. Facility reports they have received authorization.  Benay Pike, [place]

## 2015-11-15 NOTE — Care Management Important Message (Signed)
Important Message  Patient Details  Name: Laura Melendez MRN: [id number] Date of Birth: 1941/04/08   Medicare Important Message Given:  Yes    Joylene Draft, RN 11/15/2015, 3:24 PM

## 2015-11-15 NOTE — Progress Notes (Signed)
ANTICOAGULATION CONSULT NOTE   Pharmacy Consult for Coumadin Indication: atrial fibrillation  No Known Allergies  Patient Measurements: Height: 5\' 6"  (167.6 cm) Weight: 85 lb 3.2 oz (38.646 kg) IBW/kg (Calculated) : 59.3kg  Vital Signs: Temp: 97.6 F (36.4 C) (11/29 0459) Temp Source: Axillary (11/29 0459) BP: 146/59 mmHg (11/29 0459) Pulse Rate: 85 (11/29 0459)  Labs:  Recent Labs  11/13/15 0545 11/14/15 0649 11/15/15 0603  HGB  --   --  8.2*  HCT  --   --  27.0*  PLT  --   --  497*  LABPROT 17.9* 17.9* 19.7*  INR 1.47 1.47 1.67*  CREATININE 0.77 0.89  --    Estimated Creatinine Clearance: 33.8 mL/min (by C-G formula based on Cr of 0.89).  Medical History: Past Medical History  Diagnosis Date  . Essential hypertension, benign   . Type 2 diabetes mellitus ([place])   . Osteoporosis 06/2008  . Stroke [facility]) 03/2015    Rght hemiparesis  . History of parotid cancer    Assessment: Chronic afib on coumadin. INR is unstable, trended down to SUBtherapeutic level after being elevated for a few days, now starting to trend back up. No bleeding reported.   Goal of Therapy:  INR 2-3 Monitor platelets by anticoagulation protocol: Yes   Plan:  Coumadin 4mg  po today x 1  Daily PT/INR Monitor for S/S of bleeding  Thanks for allowing pharmacy to be a part of this patient's care.  Excell Seltzer, PharmD Clinical Pharmacist  11/15/2015,12:38 PM

## 2015-11-15 NOTE — Care Management Note (Signed)
Case Management Note  Patient Details  Name: BERETTA WETHERBY MRN: [id number] Date of Birth: 12-Jun-1941  Subjective/Objective:                    Action/Plan:   Expected Discharge Date:  11/09/15               Expected Discharge Plan:  Skilled Nursing Facility  In-House Referral:  Clinical Social Work  Discharge planning Services  CM Consult  Post Acute Care Choice:  NA Choice offered to:  NA  DME Arranged:    DME Agency:     HH Arranged:    [place] Agency:     Status of Service:  Completed, signed off  Medicare Important Message Given:  Yes Date Medicare IM Given:    Medicare IM give by:    Date Additional Medicare IM Given:    Additional Medicare Important Message give by:     If discussed at [place] of Stay Meetings, dates discussed:    Additional Comments: Pt discharged to [facility] today. CSW to arrange discharge to facility. Christinia Gully [place], RN 11/15/2015, 4:20 PM

## 2015-11-15 NOTE — Progress Notes (Signed)
SLP Cancellation Note  Patient Details Name: Laura Melendez MRN: [id number] DOB: 1941/04/06   Cancelled treatment:        SLP reviewing chart, as pt was on SLP caseload last week with recommendation for D1/puree with honey-thick liquids via teaspoon presentation prior to transfer to ICU. Pt no longer on SLP caseload due to transfer to higher level of care- will need new order from MD if desired, however if patient is going to transfer to [facility] today than recommend f/u SLP at [facility] instead. If pt, [facility] not [facility], please consider ordering SLP again for po trials in acute setting if appropriate.  Thank you,  Genene Churn, [place]    [place] 11/15/2015, 2:48 PM

## 2015-11-15 NOTE — Progress Notes (Signed)
Primary cardiologist: Dr. Satira Sark  Seen for followup: Abnormal ECG, diastolic heart failure  Subjective:    Opens eyes to voice intermittently. No verbal communication.  Objective:   Temp:  [97.6 F (36.4 C)-98.6 F (37 C)] 97.6 F (36.4 C) (11/29 0459) Pulse Rate:  [85-98] 85 (11/29 0459) Resp:  [19-20] 20 (11/29 0459) BP: (139-146)/(55-66) 146/59 mmHg (11/29 0459) SpO2:  [96 %-100 %] 99 % (11/29 0744) Weight:  [85 lb 3.2 oz (38.646 kg)] 85 lb 3.2 oz (38.646 kg) (11/29 0459) Last BM Date: 11/14/15  Filed Weights   11/12/15 0623 11/13/15 0500 11/15/15 0459  Weight: 88 lb 6.5 oz (40.1 kg) 90 lb 11.2 oz (41.141 kg) 85 lb 3.2 oz (38.646 kg)    Intake/Output Summary (Last 24 hours) at 11/15/15 0909 Last data filed at 11/15/15 0609  Gross per 24 hour  Intake 564.75 ml  Output   1850 ml  Net -1285.25 ml    Telemetry: Sinus rhythm with PACs.  Exam:  General: Frail, cachectic woman in no obvious distress.  HEENT: Poor dentition. No elevated JVP.  Lungs: Diminished breath sounds at the bases. Scattered rhonchi.  Cardiac: RRR without gallop.  Abdomen: PEG tube.  Extremities: No edema.  Lab Results:  Basic Metabolic Panel:  Recent Labs Lab 11/08/15 1700  11/09/15 0428 11/11/15 0427 11/12/15 0449 11/13/15 0545 11/14/15 0649  NA  --   < > 145 145 142 141 142  K  --   < > 5.3* 3.7 3.8 3.6 3.7  CL  --   < > 108 107 106 109 110  CO2  --   < > 28 28 29 27 27   GLUCOSE  --   < > 101* 64* 84 88 154*  BUN  --   < > 28* 39* 33* 31* 33*  CREATININE  --   < > 0.83 0.89 0.79 0.77 0.89  CALCIUM  --   < > 8.8* 8.8* 8.9 8.9 8.7*  MG 2.2  --  2.1 2.5*  --   --   --   < > = values in this interval not displayed.  CBC:  Recent Labs Lab 11/09/15 0428 11/15/15 0603  WBC 10.4 8.1  HGB 8.4* 8.2*  HCT 27.6* 27.0*  MCV 82.6 82.6  PLT 627* 497*    Cardiac Enzymes:  Recent Labs Lab 11/08/15 1700 11/08/15 2223 11/09/15 0428  TROPONINI 0.04* 0.03  0.03    Coagulation:  Recent Labs Lab 11/13/15 0545 11/14/15 0649 11/15/15 0603  INR 1.47 1.47 1.67*    Chest x-ray 11/14/2015: FINDINGS: There is unchanged cardiomegaly. Central and basilar ground-glass opacities persist, likely alveolar edema. Pleural effusions persist bilaterally. No pneumothorax.  IMPRESSION: Unchanged, with persisting cardiomegaly, pleural effusions and alveolar edema.   Echocardiogram 11/09/2015: Study Conclusions  - Left ventricle: The cavity size was normal. Wall thickness was increased in a pattern of mild LVH. Systolic function was normal. The estimated ejection fraction was in the range of 50% to 55%. Indeterminate diastolic function. Wall motion was normal; there were no regional wall motion abnormalities. - Aortic valve: Mildly calcified annulus. Trileaflet; mildly thickened leaflets. Valve area (VTI): 1.9 cm^2. Valve area (Vmax): 1.87 cm^2. - Left atrium: The atrium was severely dilated. - Pericardium, extracardiac: There is a large left pleural effusion. - Technically adequate study.   Medications:   Scheduled Medications: . amoxicillin-clavulanate  1 tablet Per Tube Q12H  . antiseptic oral rinse  7 mL Mouth Rinse q12n4p  .  antiseptic oral rinse  7 mL Mouth Rinse BID  . aspirin  81 mg Per Tube Daily  . chlorhexidine  15 mL Mouth Rinse BID  . free water  200 mL Per Tube 3 times per day  . furosemide  20 mg Per Tube Daily  . insulin aspart  0-9 Units Subcutaneous 6 times per day  . ipratropium-albuterol  3 mL Nebulization TID  . levETIRAcetam  1,500 mg Per Tube QHS  . levETIRAcetam  750 mg Per Tube Daily  . methylphenidate  5 mg Per Tube BID WC  . metoprolol tartrate  25 mg Per Tube BID  . pravastatin  40 mg Per Tube QPM  . sodium chloride  3 mL Intravenous Q12H  . Warfarin - Pharmacist Dosing Inpatient   Does not apply Q24H    Infusions: . feeding supplement (VITAL AF 1.2 CAL) 1,000 mL (11/15/15 0500)     PRN Medications: acetaminophen **OR** acetaminophen, albuterol, guaiFENesin-dextromethorphan, ondansetron (ZOFRAN) IV   Assessment:   1. Abnormal ECG - deep T wave inversion and prolonged QTc, stable by follow-up tracing. No obvious chest pain, but ischemia to be considered (no acute CNS event and e-lytes now normal). Echocardiogram shows LVEF 50-55% and no wall motion abnormalities. Managing conservatively at this point.  2. 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 Aortic atherosclerosis and mural thrombus noted, and has been on Coumadin since then. No history of atrial fibrillation.  3. Acute on chronic diastolic heart failure. Continues to diurese. On Lasix per PEG.  4. Failure to thrive with recurring aspiration pneumonia. Has had declining course in general. Palliative Care has evaluated and patient currently has limited code status.   Plan/Discussion:    Continue ASA for now (INR 1.6) with plan to stop when INR 2-3. On Coumadin per pharmacy, lopressor, and Zocor. Continue Lasix at low dose. No additional cardiac testing planned at this point - would manage medically.  Satira Sark, M.D., F.A.C.C.

## 2015-11-15 NOTE — Progress Notes (Addendum)
Daily Progress Note   Patient Name: Laura Melendez       Date: 11/15/2015 DOB: 1941/08/20  Age: 74 y.o. MRN#: [id number] Attending Physician: Orvan Falconer, MD Primary Care Physician: [name], MD Admit Date: 11/06/2015  Reason for Consultation/Follow-up: Establishing goals of care and Psychosocial/spiritual support  Subjective: Laura Melendez is resting in bed, looking out her door, with her husband at bedside.  Laura Melendez tells me that cardiologist came by earlier and told them that her heart was ok, that they wouldn't be able to catheterize her even if needed.  We talk about her transfer to [facility] for rehab, and the initial goal to safely transfer from bed to wc.  We also talk about tube feeding, and difference with continuouse and bolus feeds.  Laura Melendez tells me that she will start ST services at [facility].  I encourage him to put her partial plate in her mouth and try to return as much as possible to their normal life/routine.  Laura Melendez tells me they have been married 65 years. He talks about a neighbor who recently lost his wife and how they never see him now.  We talk about supporting each other during these times.        Length of Stay: 9 days  Current Medications: Scheduled Meds:  . amoxicillin-clavulanate  1 tablet Per Tube Q12H  . antiseptic oral rinse  7 mL Mouth Rinse q12n4p  . antiseptic oral rinse  7 mL Mouth Rinse BID  . aspirin  81 mg Per Tube Daily  . chlorhexidine  15 mL Mouth Rinse BID  . free water  200 mL Per Tube 3 times per day  . furosemide  20 mg Per Tube Daily  . insulin aspart  0-9 Units Subcutaneous 6 times per day  . ipratropium-albuterol  3 mL Nebulization TID  . levETIRAcetam  1,500 mg Per Tube QHS  . levETIRAcetam  750 mg Per Tube Daily  . methylphenidate  5 mg Per Tube BID WC  . metoprolol tartrate  25 mg Per Tube BID  . pravastatin  40 mg Per Tube QPM  . sodium chloride  3 mL Intravenous Q12H  . warfarin  4 mg Oral Once  . Warfarin - Pharmacist Dosing  Inpatient   Does not apply Q24H    Continuous Infusions: . feeding supplement (VITAL AF 1.2 CAL) 1,000 mL (11/15/15 0500)    PRN Meds: acetaminophen **OR** acetaminophen, albuterol, guaiFENesin-dextromethorphan, ondansetron (ZOFRAN) IV  Palliative Performance Scale: 20% at best, Tube feeding     Vital Signs: BP 146/59 mmHg  Pulse 85  Temp(Src) 97.6 F (36.4 C) (Axillary)  Resp 20  Ht 5\' 6"  (1.676 m)  Wt 38.646 kg (85 lb 3.2 oz)  BMI 13.76 kg/m2  SpO2 99% SpO2: SpO2: 99 % O2 Device: O2 Device: Nasal Cannula O2 Flow Rate: O2 Flow Rate (L/min): 2 L/min  Intake/output summary:  Intake/Output Summary (Last 24 hours) at 11/15/15 1338 Last data filed at 11/15/15 0609  Gross per 24 hour  Intake 564.75 ml  Output   1850 ml  Net -1285.25 ml   LBM:   Baseline Weight: Weight: 42.893 kg (94 lb 9 oz) Most recent weight: Weight: 38.646 kg (85 lb 3.2 oz)  Physical Exam: Constitutional:  Elderly, frail, lying in bed, makes and keeps eye contact.  Resp: even and non labored Psych: calm, pulling at telemetry lines.               Additional Data Reviewed: Recent  Labs     11/13/15  0545  11/14/15  0649  11/15/15  0603  WBC   --    --   8.1  HGB   --    --   8.2*  PLT   --    --   497*  NA  141  142   --   BUN  31*  33*   --   CREATININE  0.77  0.89   --      Problem List:  Patient Active Problem List   Diagnosis Date Noted  . Palliative care encounter   . DNR (do not resuscitate) discussion   . Acute on chronic diastolic (congestive) heart failure ([place])   . Acute on chronic diastolic CHF (congestive heart failure) ([place]) 10/29/2015  . Malnutrition of moderate degree 10/19/2015  . Aspiration pneumonia ([place]) 10/17/2015  . Sepsis ([place]) 10/17/2015  . Acute respiratory failure with hypoxia ([place]) 10/17/2015  . 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 Right hemiparesis ([place])  04/04/2015  . 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 DM (diabetes mellitus) ([place]) 03/23/2014  . Osteoporosis, unspecified 03/23/2014  . Dyslipidemia 03/23/2014  . Tobacco abuse 03/23/2014  . Syncope 01/01/2014  . Essential hypertension, benign 01/01/2014     Palliative Care Assessment & Plan    Code Status:  Limited code  Goals of Care:  Return to [facility] for rehab, return to home.   Symptom Management:  Tylenol 650 mg PT/PR Q 6 hours PRN  Zofran 54 mg IV Q 6 hours PRN.   Palliative Prophylaxis:  None at this time.   Psycho-social/Spiritual:  Desire for further Chaplaincy support:  Not at this time.    Prognosis: Unable to determine, likely less than 6 months  Discharge Planning: [place] for rehab with Palliative care service follow-up, with goal to return to home.    Care plan was discussed with nursing staff, CM, SW, and Dr. Marin Comment.   Thank you for allowing the Palliative Medicine Team to assist in the care of this patient.   Time In:  1200 Time Out: 1230 Total Time 30 minutes Prolonged Time Billed  no     Greater than 50%  of this time was spent counseling and coordinating care related to the above assessment and plan.   Drue Novel, NP  11/15/2015, 1:38 PM  Please contact Palliative Medicine Team phone at [phone] for questions and concerns.    We also talk about aspiration and the likelihood of recurrence.  Mr. Vires nods his head yes, and states he is aware.

## 2015-11-15 NOTE — Care Management Note (Signed)
Case Management Note  Patient Details  Name: WENDEE GATCHELL MRN: [id number] Date of Birth: 1941/01/25  Subjective/Objective:                    Action/Plan:   Expected Discharge Date:  11/09/15               Expected Discharge Plan:  Skilled Nursing Facility  In-House Referral:  Clinical Social Work  Discharge planning Services  CM Consult  Post Acute Care Choice:  NA Choice offered to:  NA  DME Arranged:    DME Agency:     HH Arranged:    [place] Agency:     Status of Service:  Completed, signed off  Medicare Important Message Given:  Yes Date Medicare IM Given:    Medicare IM give by:    Date Additional Medicare IM Given:    Additional Medicare Important Message give by:     If discussed at [place] of Stay Meetings, dates discussed:11/15/15    Additional Comments:  Joylene Draft, RN 11/15/2015, 4:02 PM

## 2015-11-15 NOTE — Discharge Summary (Signed)
Physician Discharge Summary  Laura Melendez [id number] DOB: 1941/01/07 DOA: 11/06/2015  PCP: Laura Lange, MD  Admit date: 11/06/2015 Discharge date: 11/15/2015  Time spent: 35 minutes  Recommendations for Outpatient Follow-up:  1. Follow up with PCP in one week. 2. Follow up with neurologist as soon as appointment is available.    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  Alert, conversing.   Diet recommendation: Dysphagia 1, honey thicken.  Recommend reconsult speech at [facility].   Filed Weights   11/12/15 0623 11/13/15 0500 11/15/15 0459  Weight: 40.1 kg (88 lb 6.5 oz) 41.141 kg (90 lb 11.2 oz) 38.646 kg (85 lb 3.2 oz)    History of present illness: patient was admitted for SOB, suspicious of aspiration again and hypoxia by Dr Laura Melendez on Nov 06, 2015.  As per his H and P:  " HPI: Laura Melendez is a 74 y.o. female, recent hospitalization at [facility] 10/29/15-11/02/15 for acute hypoxic respiratory failure due to acute on chronic diastolic CHF, presented to [facility] ED from SNF on 11/06/15 with new onset of acute dyspnea, suspected aspiration of gastric contents and hypoxia in the mid 80s. As per review of records and discussion with son at bedside, patient has had a complicated and [place] course over the last 2 months. She underwent elective left parotidectomy for malignant tumor at [facility] approximately 2 months ago. She then  developed wound hematoma requiring repeat surgery. This was followed by gram-negative pneumonia and VDRF. She had dysphagia and PEG tube was placed. Discharge to [facility] for rehabilitation. Rehospitalized at [facility] 10/31-11/4 for aspiration pneumonia. Discharged back to [facility] for rehabilitation. Rehospitalized to [facility] as stated above. Since her discharge back to rehabilitation, patient apparently did very well. She was not on home oxygen. She had periodic sleepiness and M.D. at SNF some medication adjustments were made following which she made further improvement. As per son, she was up in the chair couple days ago which was the first time she has done so in the last 2 months. She was also able to eat toast and Jell-O. Early 11/20 a.m., patient apparently was found lying supine in bed with difficulty breathing and suspected aspiration. She arrived to the ED with CPAP by EMS and oxygen saturations in the mid 80s at [facility]. Chest x-ray revealed worsening bibasilar opacities. She was admitted to stepdown unit for concern of aspiration pneumonia and acute on diastolic CHF leading to acute respiratory failure with hypoxia. 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   Hospital Course:  Patient had a very rocky course, started in the ICU, but she slowly improved  and became more stable.  She has extensive past medical history - of left parotidectomy for malignant tumor, aspiration pneumonia s/p VDRF, chronic diastolic CHF, DM 2, dysphagia status post PEG tube, possible seizure disorder on Keppra, HLD, left MCA  territory CVA with residual right hemiparesis, chronic A. 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 She was admitted to stepdown unit for aspiration pneumonia, decompensated diastolic CHF and acute respiratory failure with hypoxia. She was seen in consultation with cardiolology and pulmonary. She is now doing better off Bipap. Palliative care consultation was requested, and Ms Laura Melendez saw her. Plan to proceed with SNF for rehab, and to continue with partial code status, which means no intubation, no CPR, but cardiac meds are OK.  She no longer require bipap except at night.  She is no longer SOB at this time.  She had period of unresponsiveness, and during her episode, she was very lethargic subsequently. ABG was done during this period, and it was OK, CT head was negative, and reviewed her med list showed no sedative meds, except the Keppra. At the time of discharge, her Keppra level is still pending.  Her TF was resumed and she became alert again today.  This is suspicious for seizure, but I am not certain.  She should see her neurologist in follow up.  If these episode is more frequent, anticonsulvie meds needs to be changed or added.  Her TF was resume, and she tolerated it well.  She had finished her antibiotics, and no longer need any further antibiotics.  She will continue with lasix in her TF.  She is stable for discharged today.  She will follow up with her PCP in one week, and with her neurologist as well.  Thank you for allowing me to particiapte in her care and Good Day.     Discharge Exam: Filed Vitals:   11/15/15 0459 11/15/15  1442  BP: 146/59 138/56  Pulse: 85 92  Temp: 97.6 F (36.4 C) 97.8 F (36.6 C)  Resp: 20 20   Discharge Instructions   Discharge Instructions    Increase activity slowly    Complete by:  As directed           Current Discharge Medication List    START taking these medications   Details  albuterol (PROVENTIL) (2.5 MG/3ML) 0.083% nebulizer solution Take 3 mLs (2.5 mg total) by nebulization every 2 (two) hours as needed for wheezing. Qty: 75 mL, Refills: 12      CONTINUE these medications which have CHANGED   Details  pravastatin (PRAVACHOL) 40 MG tablet Place 1 tablet (40 mg total) into feeding tube every evening. Qty: 30 tablet, Refills: 1      CONTINUE these medications which have NOT CHANGED   Details  aspirin EC 81 MG EC tablet Take 1 tablet (81 mg total) by mouth daily.    clotrimazole-betamethasone (LOTRISONE) cream Apply 1 application topically daily as needed (APPLY A THIN LAYER AS BARRIER AFTER CLEANING AND DRYING BUTTOCKS AREA COMPLETELY).    FLUoxetine (PROZAC) 10 MG capsule TAKE 1 CAPSULE BY MOUTH DAILY FOR DEPRESSION/ANXIETY Qty: 90 capsule, Refills: 1    furosemide (LASIX) 20 MG tablet 20  mg by PEG Tube route daily.    levETIRAcetam (KEPPRA) 750 MG tablet TAKE 1 TABLET BY MOUTH EVERY MORNING AND 2 TABLETS BY MOUTH EVERY NIGHT AT BEDTIME Qty: 270 tablet, Refills: 3    metFORMIN (GLUCOPHAGE) 500 MG tablet Take one half tablet BID Qty: 30 tablet, Refills: 5    metoprolol tartrate (LOPRESSOR) 25 MG tablet Take 25 mg by mouth 2 (two) times daily.    Nutritional Supplements (FEEDING SUPPLEMENT, VITAL AF 1.2 CAL,) LIQD Place 1,000 mLs into feeding tube daily.    warfarin (COUMADIN) 4 MG tablet Take 4 mg by mouth every evening.    Water For Irrigation, Sterile (FREE WATER) SOLN Place 200 mLs into feeding tube every 8 (eight) hours.      STOP taking these medications     loperamide (IMODIUM) 2 MG capsule      methylphenidate (RITALIN) 5 MG tablet       metoCLOPramide (REGLAN) 10 MG tablet      nystatin (MYCOSTATIN/NYSTOP) 100000 UNIT/GM POWD      ondansetron (ZOFRAN ODT) 4 MG disintegrating tablet        No Known Allergies    The results of significant diagnostics from this hospitalization (including imaging, microbiology, ancillary and laboratory) are listed below for reference.    Significant Diagnostic Studies: Ct Head Wo Contrast  11/13/2015  CLINICAL DATA:  Altered mental status, lethargy EXAM: CT HEAD WITHOUT CONTRAST TECHNIQUE: Contiguous axial images were obtained from the base of the skull through the vertex without intravenous contrast. COMPARISON:  10/13/2015 FINDINGS: No skull fracture is noted. No intracranial hemorrhage, mass effect or midline shift. No acute cortical infarction. No mass lesion is noted on this unenhanced scan. The gray and white-matter differentiation is preserved. Stable cerebral atrophy. Extensive periventricular and patchy subcortical chronic white matter disease again noted. IMPRESSION: No acute intracranial abnormality. Stable atrophy and extensive chronic white matter disease. Electronically Signed   By: Lahoma Crocker M.D.   On: 11/13/2015 15:13   Dg Chest Port 1 View  11/14/2015  CLINICAL DATA:  Aspiration EXAM: PORTABLE CHEST 1 VIEW COMPARISON:  11/13/2015 FINDINGS: There is unchanged cardiomegaly. Central and basilar ground-glass opacities persist, likely alveolar edema. Pleural effusions persist bilaterally. No pneumothorax. IMPRESSION: Unchanged, with persisting cardiomegaly, pleural effusions and alveolar edema Electronically Signed   By: Andreas Newport M.D.   On: 11/14/2015 06:42   Dg Chest Port 1 View  11/13/2015  CLINICAL DATA:  74 year old female with pneumonia EXAM: PORTABLE CHEST 1 VIEW COMPARISON:  Prior chest x-ray 11/09/2015 FINDINGS: Overall, no significant interval change in the appearance of the chest. Persistent cardiomegaly and mild pulmonary edema. There are moderate bilateral  layering pleural effusions with associated bibasilar opacities. No pneumothorax. No support apparatus. IMPRESSION: No significant interval change in the appearance of the chest compared to 11/09/2015. Stable cardiomegaly, mild pulmonary edema and moderate bilateral layering effusions with associated bibasilar atelectasis. Superimposed infiltrate in the lung bases would be difficult to exclude radiographically. Electronically Signed   By: Jacqulynn Cadet M.D.   On: 11/13/2015 08:54   Dg Chest Port 1 View  11/09/2015  CLINICAL DATA:  Acute respiratory failure with hypoxia. Aspiration pneumonia. Congestive heart failure. EXAM: PORTABLE CHEST 1 VIEW COMPARISON:  11/08/2015 FINDINGS: Cardiomegaly stable. Pulmonary vascular congestion and symmetric lower lung airspace disease shows no significant change allowing for ladder radiograph technique, most consistent with pulmonary edema. Probable layering bilateral pleural effusions again noted. IMPRESSION: No significant change in cardiomegaly, probable diffuse pulmonary edema, and bilateral pleural effusions,  consistent with congestive heart failure. Electronically Signed   By: Earle Gell M.D.   On: 11/09/2015 07:44   Dg Chest Port 1 View  11/08/2015  CLINICAL DATA:  Hypoxia EXAM: PORTABLE CHEST - 1 VIEW COMPARISON:  11/08/2015 FINDINGS: Cardiac shadow remains enlarged. Bibasilar changes are again seen with pleural effusions. Mild vascular congestion is noted. No focal confluent infiltrate is seen. No bony abnormality is noted. Gastrostomy catheter is noted over the upper abdomen. IMPRESSION: Stable appearance of the chest when compare with the prior exam consistent with mild CHF. Electronically Signed   By: Inez Catalina M.D.   On: 11/08/2015 14:41   Dg Chest Port 1 View  11/08/2015  CLINICAL DATA:  Pneumonia, cough EXAM: PORTABLE CHEST 1 VIEW COMPARISON:  11/07/2015 FINDINGS: Cardiomegaly with vascular congestion. Bilateral layering effusions and lower lobe  opacities, likely edema. Findings are similar to prior study. No acute bony abnormality. IMPRESSION: Suspect mild CHF with bilateral lower lobe opacities, likely edema and layering effusions. No real change. Electronically Signed   By: Rolm Baptise M.D.   On: 11/08/2015 10:09   Dg Chest Port 1 View  11/07/2015  CLINICAL DATA:  Aspiration pneumonia. EXAM: PORTABLE CHEST 1 VIEW COMPARISON:  11/06/2015. FINDINGS: Cardiomegaly with pulmonary vascular prominence and interstitial prominence consistent with congestive heart failure. Bibasilar pneumonia cannot be excluded . Small bilateral effusions. No pneumothorax. IMPRESSION: Findings suggest congestive heart failure with bilateral pulmonary edema and small pleural effusions. Bibasilar pneumonia cannot be excluded. No significant change from prior exam. Electronically Signed   By: [place]   On: 11/07/2015 08:31   Dg Chest Portable 1 View  11/06/2015  CLINICAL DATA:  Shortness of breath. EXAM: PORTABLE CHEST 1 VIEW COMPARISON:  10/29/2015 FINDINGS: Worsening bibasilar opacities from prior with developing haziness suggestive of pleural effusions. Vascular congestion is unchanged. Cardiomediastinal contours with mild cardiomegaly are unchanged. No pneumothorax. The bones are under mineralized IMPRESSION: Worsening bibasilar opacities with developing lower lobe haziness suggestive of pleural effusions. Aspiration, pneumonia, or pleural effusions with compressive atelectasis. Electronically Signed   By: Jeb Levering M.D.   On: 11/06/2015 05:32   Dg Chest Portable 1 View  10/29/2015  CLINICAL DATA:  Respiratory distress EXAM: PORTABLE CHEST - 1 VIEW COMPARISON:  10/17/2015 FINDINGS: Cardiac shadow is stable. Lungs are well aerated bilaterally. Mild vascular congestion is noted as well as bibasilar increased density likely related atelectasis or early infiltrate. No bony abnormality is seen. IMPRESSION: Increasing bibasilar densities and mild vascular  congestion. Electronically Signed   By: Inez Catalina M.D.   On: 10/29/2015 20:52   Dg Abd Acute W/chest  10/17/2015  CLINICAL DATA:  Nausea and vomiting, possible aspiration. EXAM: DG ABDOMEN ACUTE W/ 1V CHEST COMPARISON:  Chest x-ray of October 13, 2015 FINDINGS: Chest x-ray: The lungs are adequately inflated. There are increased densities in the lower lobes bilaterally. These are more conspicuous than in the past. The heart is top-normal in size but stable. The pulmonary vascularity is normal. The mediastinum is normal in width. Within the abdomen there is a moderate amount of contrast noted throughout the normal caliber colon. A gastrostomy tube is in place. The stomach and small bowel do not appear abnormally distended. There is gentle levocurvature of the mid lumbar spine. IMPRESSION: 1. Increased density at both lung bases worrisome for the clinically suspected aspiration and development of atelectasis or pneumonia. Stable enlargement cardiac silhouette without pulmonary edema. 2. The bowel gas pattern is within the limits of normal with exception  of the presence of a moderate amount of contrast in the normal calibered colon. This contrast may be residual from previous studies. I do not have history of recent oral contrast administration. Electronically Signed   By: David  Martinique M.D.   On: 10/17/2015 09:50    Microbiology: Recent Results (from the past 240 hour(s))  Urine culture     Status: None   Collection Time: 11/06/15  6:02 AM  Result Value Ref Range Status   Specimen Description URINE, CLEAN CATCH  Final   Special Requests NONE  Final   Culture   Final    60,000 COLONIES/ml YEAST Performed at [facility]    Report Status 11/08/2015 FINAL  Final  Culture, blood (routine x 2)     Status: None   Collection Time: 11/06/15  6:56 AM  Result Value Ref Range Status   Specimen Description BLOOD RIGHT HAND  Final   Special Requests BOTTLES DRAWN AEROBIC AND ANAEROBIC 4CC EACH   Final   Culture NO GROWTH 5 DAYS  Final   Report Status 11/11/2015 FINAL  Final  Culture, blood (routine x 2)     Status: None   Collection Time: 11/06/15  7:04 AM  Result Value Ref Range Status   Specimen Description BLOOD LEFT HAND  Final   Special Requests BOTTLES DRAWN AEROBIC AND ANAEROBIC [place]  Final   Culture NO GROWTH 5 DAYS  Final   Report Status 11/11/2015 FINAL  Final     Labs: Basic Metabolic Panel:  Recent Labs Lab 11/08/15 1700 11/09/15 0428 11/11/15 0427 11/12/15 0449 11/13/15 0545 11/14/15 0649  NA  --  145 145 142 141 142  K  --  5.3* 3.7 3.8 3.6 3.7  CL  --  108 107 106 109 110  CO2  --  28 28 29 27 27   GLUCOSE  --  101* 64* 84 88 154*  BUN  --  28* 39* 33* 31* 33*  CREATININE  --  0.83 0.89 0.79 0.77 0.89  CALCIUM  --  8.8* 8.8* 8.9 8.9 8.7*  MG 2.2 2.1 2.5*  --   --   --    Liver Function Tests: No results for input(s): AST, ALT, ALKPHOS, BILITOT, PROT, ALBUMIN in the last 168 hours. No results for input(s): LIPASE, AMYLASE in the last 168 hours. No results for input(s): AMMONIA in the last 168 hours. CBC:  Recent Labs Lab 11/09/15 0428 11/15/15 0603  WBC 10.4 8.1  HGB 8.4* 8.2*  HCT 27.6* 27.0*  MCV 82.6 82.6  PLT 627* 497*   Cardiac Enzymes:  Recent Labs Lab 11/08/15 1700 11/08/15 2223 11/09/15 0428  TROPONINI 0.04* 0.03 0.03   BNP: BNP (last 3 results)  Recent Labs  10/29/15 2040 11/06/15 0503 11/09/15 0428  BNP >4500.0* 3854.0* >4500.0*    ProBNP (last 3 results) No results for input(s): PROBNP in the last 8760 hours.  CBG:  Recent Labs Lab 11/14/15 2102 11/15/15 0015 11/15/15 0502 11/15/15 0745 11/15/15 1205  GLUCAP 122* 127* 105* 138* 141*   Signed:  Jawuan Robb  [facility] 11/15/2015, 4:05 PM

## 2015-11-15 NOTE — Progress Notes (Signed)
Nutrition Follow-Up  INTERVENTION:   Vital 1.2 ml/hr @ 45 ml/hr and tolerating well per nursing  Provides 1296  kcal, 81 gr protein and 875 ml water. Meets100% energy and 100% protein needs.  Free water -200 ml TID and 30 ml water before and after medications.     NUTRITION DIAGNOSIS:  Predicted suboptimal oral nutrient intake related to swallow difficulty: ongoing. Pt PEG providing 100% of nutriton.  GOAL: Provide nutrition needs based on ASPEN/SCCM guidelines; met    MONITOR: TF tolerance, Labs, Weight trends, Skin   REASON FOR ASSESSMENT: Enteral feeding/NPO    ASSESSMENT:  Pt advanced now to goal with no c/o nausea or vomiting.   Pt possible discharge back to [facility] today.  Abnormal Labs: glucose 138 mg/dl.     Diet Order:  Diet NPO time specified  Skin:   no new concerns   Last BM: 11/29 (stool is loose)  Height:   Ht Readings from Last 1 Encounters:  11/06/15 '5\' 6"'  (1.676 m)    Weight:   Wt Readings from Last 1 Encounters:  11/15/15 85 lb 3.2 oz (38.646 kg)    Ideal Body Weight:   59 kg  BMI:  Body mass index is 13.76 kg/(m^2). underweight  Re-Estimated Nutritional Needs:   Kcal:  0301-3143  Protein:   60-70 gr  Fluid:   1.3 liters daily  EDUCATION NEEDS: none at this time     Colman Cater MS,RD,CSG,LDN Office: #[phone] Pager: [phone]

## 2015-11-16 ENCOUNTER — Other Ambulatory Visit: Payer: Self-pay | Admitting: *Deleted

## 2015-11-16 ENCOUNTER — Non-Acute Institutional Stay (SKILLED_NURSING_FACILITY): Payer: Medicare HMO | Admitting: Internal Medicine

## 2015-11-16 ENCOUNTER — Encounter (HOSPITAL_COMMUNITY)
Admission: AD | Disposition: A | Discharge status: Other Acute Inpatient Hospital | Payer: Self-pay | Source: Ambulatory Visit | Attending: Internal Medicine

## 2015-11-16 ENCOUNTER — Encounter: Payer: Medicare HMO | Admitting: Physical Medicine & Rehabilitation

## 2015-11-16 ENCOUNTER — Ambulatory Visit (HOSPITAL_COMMUNITY)
Admission: AD | Admit: 2015-11-16 | Discharge: 2015-11-17 | Disposition: A | Discharge status: Other Acute Inpatient Hospital | Payer: Medicare HMO | Source: Ambulatory Visit | Attending: Internal Medicine | Admitting: Internal Medicine

## 2015-11-16 ENCOUNTER — Encounter: Payer: Self-pay | Admitting: Internal Medicine

## 2015-11-16 DIAGNOSIS — I5033 Acute on chronic diastolic (congestive) heart failure: Secondary | ICD-10-CM | POA: Diagnosis not present

## 2015-11-16 DIAGNOSIS — R1314 Dysphagia, pharyngoesophageal phase: Secondary | ICD-10-CM | POA: Diagnosis not present

## 2015-11-16 DIAGNOSIS — Z79899 Other long term (current) drug therapy: Secondary | ICD-10-CM | POA: Insufficient documentation

## 2015-11-16 DIAGNOSIS — Z87891 Personal history of nicotine dependence: Secondary | ICD-10-CM | POA: Insufficient documentation

## 2015-11-16 DIAGNOSIS — Z85818 Personal history of malignant neoplasm of other sites of lip, oral cavity, and pharynx: Secondary | ICD-10-CM | POA: Insufficient documentation

## 2015-11-16 DIAGNOSIS — Z7984 Long term (current) use of oral hypoglycemic drugs: Secondary | ICD-10-CM | POA: Diagnosis not present

## 2015-11-16 DIAGNOSIS — M810 Age-related osteoporosis without current pathological fracture: Secondary | ICD-10-CM | POA: Insufficient documentation

## 2015-11-16 DIAGNOSIS — I10 Essential (primary) hypertension: Secondary | ICD-10-CM | POA: Insufficient documentation

## 2015-11-16 DIAGNOSIS — K9423 Gastrostomy malfunction: Secondary | ICD-10-CM | POA: Diagnosis not present

## 2015-11-16 DIAGNOSIS — J9601 Acute respiratory failure with hypoxia: Secondary | ICD-10-CM | POA: Diagnosis not present

## 2015-11-16 DIAGNOSIS — F329 Major depressive disorder, single episode, unspecified: Secondary | ICD-10-CM | POA: Diagnosis not present

## 2015-11-16 DIAGNOSIS — J690 Pneumonitis due to inhalation of food and vomit: Secondary | ICD-10-CM

## 2015-11-16 DIAGNOSIS — E119 Type 2 diabetes mellitus without complications: Secondary | ICD-10-CM | POA: Insufficient documentation

## 2015-11-16 DIAGNOSIS — F419 Anxiety disorder, unspecified: Secondary | ICD-10-CM | POA: Diagnosis not present

## 2015-11-16 DIAGNOSIS — Z8673 Personal history of transient ischemic attack (TIA), and cerebral infarction without residual deficits: Secondary | ICD-10-CM | POA: Insufficient documentation

## 2015-11-16 DIAGNOSIS — Z029 Encounter for administrative examinations, unspecified: Secondary | ICD-10-CM | POA: Insufficient documentation

## 2015-11-16 DIAGNOSIS — Z7982 Long term (current) use of aspirin: Secondary | ICD-10-CM | POA: Diagnosis not present

## 2015-11-16 DIAGNOSIS — Z7901 Long term (current) use of anticoagulants: Secondary | ICD-10-CM | POA: Diagnosis not present

## 2015-11-16 DIAGNOSIS — Z431 Encounter for attention to gastrostomy: Secondary | ICD-10-CM | POA: Diagnosis not present

## 2015-11-16 DIAGNOSIS — I639 Cerebral infarction, unspecified: Secondary | ICD-10-CM | POA: Diagnosis not present

## 2015-11-16 HISTORY — PX: PEG PLACEMENT: SHX5437

## 2015-11-16 SURGERY — REPLACEMENT, PEG TUBE, WITHOUT ENDOSCOPY
Anesthesia: LOCAL

## 2015-11-16 NOTE — OR Nursing (Signed)
Report given to Verlon Au... Nurse apply silver nitrate to peg site per Dr. Laural Golden. Patient tolerated procedure well. Nurse verbalized understanding of discharge orders.

## 2015-11-16 NOTE — H&P (Signed)
Laura Melendez is an 74 y.o. female.   Chief Complaint: Patient is here to have PEG tube replaced. HPI: Patient is 74 year old Caucasian female with multiple medical problems including history of CVA who is been fed via gastrostomy tube. Tube came out earlier today and nursing staff was able to insert Foley's catheter. Patient is here to have gastrostomy tube reinserted. Patient denies abdominal pain.  Past Medical History  Diagnosis Date  . Essential hypertension, benign   . Type 2 diabetes mellitus ([place])   . Osteoporosis 06/2008  . 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 History of parotid cancer     Past Surgical History  Procedure Laterality Date  . Ankle surgery Left 2004    otif  . Abdominal hysterectomy    . Anterior and posterior repair N/A 03/11/2013    Procedure: ANTERIOR (CYSTOCELE) AND POSTERIOR REPAIR (RECTOCELE);  Surgeon: Linda Hedges, DO;  Location: [facility];  Service: Gynecology;  Laterality: N/A;  with sacrospinous ligament fixation bilateral  . Colonoscopy  2009    negative  . Cataract extraction w/phaco Right 12/27/2014    Procedure: CATARACT EXTRACTION PHACO AND INTRAOCULAR LENS PLACEMENT RIGHT EYE;  Surgeon: Tonny Branch, MD;  Location: [facility];  Service: Ophthalmology;  Laterality: Right;  CDE:10.63  . Cataract extraction w/phaco Left 01/13/2015    Procedure: CATARACT EXTRACTION PHACO AND INTRAOCULAR LENS PLACEMENT LEFT EYE;  Surgeon: Tonny Branch, MD;  Location: [facility];  Service: Ophthalmology;  Laterality: Left;  CDE:18.87  . Cholecystectomy    . Tee without cardioversion N/A 04/01/2015    Procedure: TRANSESOPHAGEAL ECHOCARDIOGRAM (TEE);  Surgeon: Sanda Klein, MD;  Location: [facility];  Service: Cardiovascular;  Laterality: N/A;  . Peg placement    . Parotidectomy      Family History  Problem Relation Age of Onset  . Cancer Father     Liver  . Cancer Sister     Breast  . Diabetes Brother   . Dementia Mother    Social History:  reports that she quit  smoking about 8 months ago. Her smoking use included Cigarettes. She has a 7.5 pack-year smoking history. She does not have any smokeless tobacco history on file. She reports that she does not drink alcohol or use illicit drugs.  Allergies: No Known Allergies  Medications Prior to Admission  Medication Sig Dispense Refill  . albuterol (PROVENTIL) (2.5 MG/3ML) 0.083% nebulizer solution Take 3 mLs (2.5 mg total) by nebulization every 2 (two) hours as needed for wheezing. 75 mL 12  . aspirin EC 81 MG EC tablet Take 1 tablet (81 mg total) by mouth daily.    . clotrimazole-betamethasone (LOTRISONE) cream Apply 1 application topically daily as needed (APPLY A THIN LAYER AS BARRIER AFTER CLEANING AND DRYING BUTTOCKS AREA COMPLETELY).    Marland Kitchen FLUoxetine (PROZAC) 10 MG capsule TAKE 1 CAPSULE BY MOUTH DAILY FOR DEPRESSION/ANXIETY (Patient taking differently: TAKE 1 CAPSULE VIA PEG DAILY FOR DEPRESSION/ANXIETY) 90 capsule 1  . furosemide (LASIX) 20 MG tablet 20 mg by PEG Tube route daily.    Marland Kitchen levETIRAcetam (KEPPRA) 750 MG tablet TAKE 1 TABLET BY MOUTH EVERY MORNING AND 2 TABLETS BY MOUTH EVERY NIGHT AT BEDTIME (Patient taking differently: TAKE 1 TABLET VIA PEG TUBE IN THE MORNING & 2 TABLETS VIA PEG TUBE EVERY NIGHT AT BEDTIME) 270 tablet 3  . metFORMIN (GLUCOPHAGE) 500 MG tablet Take one half tablet BID (Patient taking differently: 250 mg by PEG Tube route 2 (two) times daily with a meal. )  30 tablet 5  . metoprolol tartrate (LOPRESSOR) 25 MG tablet Take 25 mg by mouth 2 (two) times daily.    . Nutritional Supplements (FEEDING SUPPLEMENT, VITAL AF 1.2 CAL,) LIQD Place 1,000 mLs into feeding tube daily.    . pravastatin (PRAVACHOL) 40 MG tablet Place 1 tablet (40 mg total) into feeding tube every evening. 30 tablet 1  . warfarin (COUMADIN) 4 MG tablet Take 4 mg by mouth every evening.    . Water For Irrigation, Sterile (FREE WATER) SOLN Place 200 mLs into feeding tube every 8 (eight) hours.      Results for  orders placed or performed during the hospital encounter of 11/06/15 (from the past 48 hour(s))  Glucose, capillary     Status: None   Collection Time: 11/14/15 11:54 AM  Result Value Ref Range   Glucose-Capillary 89 65 - 99 mg/dL  Glucose, capillary     Status: None   Collection Time: 11/14/15  5:01 PM  Result Value Ref Range   Glucose-Capillary 93 65 - 99 mg/dL  Glucose, capillary     Status: Abnormal   Collection Time: 11/14/15  9:02 PM  Result Value Ref Range   Glucose-Capillary 122 (H) 65 - 99 mg/dL  Glucose, capillary     Status: Abnormal   Collection Time: 11/15/15 12:15 AM  Result Value Ref Range   Glucose-Capillary 127 (H) 65 - 99 mg/dL  Glucose, capillary     Status: Abnormal   Collection Time: 11/15/15  5:02 AM  Result Value Ref Range   Glucose-Capillary 105 (H) 65 - 99 mg/dL  Protime-INR     Status: Abnormal   Collection Time: 11/15/15  6:03 AM  Result Value Ref Range   Prothrombin Time 19.7 (H) 11.6 - 15.2 seconds   INR 1.67 (H) 0.00 - 1.49  CBC     Status: Abnormal   Collection Time: 11/15/15  6:03 AM  Result Value Ref Range   WBC 8.1 4.0 - 10.5 K/uL   RBC 3.27 (L) 3.87 - 5.11 MIL/uL   Hemoglobin 8.2 (L) 12.0 - 15.0 g/dL   HCT 27.0 (L) 36.0 - 46.0 %   MCV 82.6 78.0 - 100.0 fL   MCH 25.1 (L) 26.0 - 34.0 pg   MCHC 30.4 30.0 - 36.0 g/dL   RDW 16.3 (H) 11.5 - 15.5 %   Platelets 497 (H) 150 - 400 K/uL  Glucose, capillary     Status: Abnormal   Collection Time: 11/15/15  7:45 AM  Result Value Ref Range   Glucose-Capillary 138 (H) 65 - 99 mg/dL  Glucose, capillary     Status: Abnormal   Collection Time: 11/15/15 12:05 PM  Result Value Ref Range   Glucose-Capillary 141 (H) 65 - 99 mg/dL  Glucose, capillary     Status: Abnormal   Collection Time: 11/15/15  4:45 PM  Result Value Ref Range   Glucose-Capillary 130 (H) 65 - 99 mg/dL   No results found.  ROS  There were no vitals taken for this visit. Physical Exam  Constitutional:  Well-developed and  Caucasian female who is alert and responds appropriately to simple questions.  Eyes: Conjunctivae are normal. No scleral icterus.  Neck: No thyromegaly present.  Cardiovascular: Normal rate, regular rhythm and normal heart sounds.   No murmur heard. She has mild pectum excavatum.  Respiratory: Effort normal and breath sounds normal.  GI:  Abdomen is flat and soft.. Patient has gastrostomy with foleys catheter in place.  Musculoskeletal: She exhibits no edema.  thin upper and lower extremities.  Lymphadenopathy:    She has no cervical adenopathy.  Neurological: She is alert.  Skin: Skin is warm.     Assessment/Plan Gastrostomy tube is out and need to be reinserted.  REHMAN,NAJEEB U 11/16/2015, 11:14 AM

## 2015-11-16 NOTE — Discharge Instructions (Signed)
Resume usual medications and gastric feeding as before. Apply 10% silver nitrate solution to grand relation tissue at gastrostomy tube every other day until it is resolved. Please keep gastrostomy tube site dry.

## 2015-11-16 NOTE — Op Note (Signed)
Procedure: Gastrostomy tube replacement.  Indication:  Patient is 74 year old Caucasian female who is cared for at the nursing center. She is on gastric feeding because of history of CVA. Her tube accidentally came out. Nursing staff inserted Foley's catheter in order to keep fistula open. Patient is here to have tube reinserted.  Findings: Foley's catheter was removed after deflating the balloon. Granulation tissue noted gastrostomy tube site. 24 French angled balloon replacement gastrostomy tube was advanced through existing fistula into stomach. Balloon was filled with 6 mL of sterile water. Tube position was confirmed by injecting year resulting in gurgling sound. There was also reflux of gastric contents. Gastrostomy tube was secured by bringing bolster close to the skin. Site covered with sterile dressing. No problems encountered during the procedure.  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 Granulation tissue noted at gastrostomy site.  Recommendations; Resume gastric feeding as before. Flush tube with 5-10 mL of water every time medication given. Treatment relation tissue at 10% silver nitrate every other day until it is resolved. Keep gastrostomy site dry.

## 2015-11-16 NOTE — Progress Notes (Signed)
Patient ID: Laura Melendez, female   DOB: December 27, 1940, 74 y.o.   This encounter was created in error - please disregard.

## 2015-11-17 ENCOUNTER — Encounter
Admission: RE | Admit: 2015-11-17 | Discharge: 2015-11-17 | Disposition: A | Discharge status: Home / Self Care | Payer: Medicare HMO | Source: Ambulatory Visit | Attending: Physical Medicine & Rehabilitation | Admitting: Physical Medicine & Rehabilitation

## 2015-11-17 ENCOUNTER — Inpatient Hospital Stay
Admission: RE | Admit: 2015-11-17 | Discharge: 2015-11-22 | Disposition: A | Discharge status: Still In Hospital | Payer: Medicare HMO | Source: Ambulatory Visit | Attending: Internal Medicine | Admitting: Internal Medicine

## 2015-11-17 ENCOUNTER — Emergency Department (HOSPITAL_COMMUNITY): Payer: Medicare HMO

## 2015-11-17 ENCOUNTER — Other Ambulatory Visit: Payer: Self-pay

## 2015-11-17 ENCOUNTER — Emergency Department (HOSPITAL_COMMUNITY)
Admission: EM | Admit: 2015-11-17 | Discharge: 2015-11-17 | Disposition: A | Discharge status: Home / Self Care | Payer: Medicare HMO | Attending: Emergency Medicine | Admitting: Emergency Medicine

## 2015-11-17 ENCOUNTER — Encounter (HOSPITAL_COMMUNITY): Payer: Self-pay | Admitting: Emergency Medicine

## 2015-11-17 DIAGNOSIS — Z8739 Personal history of other diseases of the musculoskeletal system and connective tissue: Secondary | ICD-10-CM | POA: Insufficient documentation

## 2015-11-17 DIAGNOSIS — Z7984 Long term (current) use of oral hypoglycemic drugs: Secondary | ICD-10-CM | POA: Diagnosis not present

## 2015-11-17 DIAGNOSIS — R000 Tachycardia, unspecified: Secondary | ICD-10-CM | POA: Insufficient documentation

## 2015-11-17 DIAGNOSIS — Z7982 Long term (current) use of aspirin: Secondary | ICD-10-CM | POA: Diagnosis not present

## 2015-11-17 DIAGNOSIS — E119 Type 2 diabetes mellitus without complications: Secondary | ICD-10-CM | POA: Insufficient documentation

## 2015-11-17 DIAGNOSIS — Z87891 Personal history of nicotine dependence: Secondary | ICD-10-CM | POA: Diagnosis not present

## 2015-11-17 DIAGNOSIS — Z029 Encounter for administrative examinations, unspecified: Secondary | ICD-10-CM | POA: Diagnosis present

## 2015-11-17 DIAGNOSIS — Z79899 Other long term (current) drug therapy: Secondary | ICD-10-CM | POA: Diagnosis not present

## 2015-11-17 DIAGNOSIS — Z8673 Personal history of transient ischemic attack (TIA), and cerebral infarction without residual deficits: Secondary | ICD-10-CM | POA: Insufficient documentation

## 2015-11-17 DIAGNOSIS — I509 Heart failure, unspecified: Secondary | ICD-10-CM

## 2015-11-17 DIAGNOSIS — I5043 Acute on chronic combined systolic (congestive) and diastolic (congestive) heart failure: Secondary | ICD-10-CM | POA: Insufficient documentation

## 2015-11-17 DIAGNOSIS — Z85818 Personal history of malignant neoplasm of other sites of lip, oral cavity, and pharynx: Secondary | ICD-10-CM | POA: Diagnosis not present

## 2015-11-17 DIAGNOSIS — Z7901 Long term (current) use of anticoagulants: Secondary | ICD-10-CM | POA: Diagnosis not present

## 2015-11-17 DIAGNOSIS — I10 Essential (primary) hypertension: Secondary | ICD-10-CM | POA: Insufficient documentation

## 2015-11-17 DIAGNOSIS — R0602 Shortness of breath: Secondary | ICD-10-CM | POA: Diagnosis present

## 2015-11-17 LAB — CBC WITH DIFFERENTIAL/PLATELET
Basophils Absolute: 0.1 10*3/uL (ref 0.0–0.1)
Basophils Relative: 1 %
EOS ABS: 0 10*3/uL (ref 0.0–0.7)
Eosinophils Relative: 0 %
HCT: 30.2 % — ABNORMAL LOW (ref 36.0–46.0)
HEMOGLOBIN: 9.2 g/dL — AB (ref 12.0–15.0)
LYMPHS ABS: 1.2 10*3/uL (ref 0.7–4.0)
Lymphocytes Relative: 9 %
MCH: 24.8 pg — AB (ref 26.0–34.0)
MCHC: 30.5 g/dL (ref 30.0–36.0)
MCV: 81.4 fL (ref 78.0–100.0)
MONOS PCT: 3 %
Monocytes Absolute: 0.4 10*3/uL (ref 0.1–1.0)
NEUTROS PCT: 87 %
Neutro Abs: 11 10*3/uL — ABNORMAL HIGH (ref 1.7–7.7)
Platelets: 527 10*3/uL — ABNORMAL HIGH (ref 150–400)
RBC: 3.71 MIL/uL — ABNORMAL LOW (ref 3.87–5.11)
RDW: 16.5 % — ABNORMAL HIGH (ref 11.5–15.5)
WBC: 12.7 10*3/uL — ABNORMAL HIGH (ref 4.0–10.5)

## 2015-11-17 LAB — COMPREHENSIVE METABOLIC PANEL
ALT: 18 U/L (ref 14–54)
AST: 22 U/L (ref 15–41)
Albumin: 3 g/dL — ABNORMAL LOW (ref 3.5–5.0)
Alkaline Phosphatase: 44 U/L (ref 38–126)
Anion gap: 10 (ref 5–15)
BUN: 30 mg/dL — AB (ref 6–20)
CHLORIDE: 109 mmol/L (ref 101–111)
CO2: 26 mmol/L (ref 22–32)
CREATININE: 0.8 mg/dL (ref 0.44–1.00)
Calcium: 9.6 mg/dL (ref 8.9–10.3)
GFR calc non Af Amer: >60 mL/min (ref >60)
Glucose, Bld: 191 mg/dL — ABNORMAL HIGH (ref 65–99)
Potassium: 3.8 mmol/L (ref 3.5–5.1)
SODIUM: 145 mmol/L (ref 135–145)
Total Bilirubin: 0.5 mg/dL (ref 0.3–1.2)
Total Protein: 7.2 g/dL (ref 6.5–8.1)

## 2015-11-17 LAB — PROTIME-INR
INR: 1.74 — AB (ref 0.00–1.49)
Prothrombin Time: 20.3 seconds — ABNORMAL HIGH (ref 11.6–15.2)

## 2015-11-17 LAB — BRAIN NATRIURETIC PEPTIDE

## 2015-11-17 LAB — LEVETIRACETAM LEVEL: Levetiracetam Lvl: 67.9 ug/mL — ABNORMAL HIGH (ref 10.0–40.0)

## 2015-11-17 MED ORDER — ALBUTEROL (5 MG/ML) CONTINUOUS INHALATION SOLN
10.0000 mg/h | INHALATION_SOLUTION | Freq: Once | RESPIRATORY_TRACT | Status: AC
  Administered 2015-11-17: 10 mg/h via RESPIRATORY_TRACT
  Filled 2015-11-17: qty 20

## 2015-11-17 MED ORDER — FUROSEMIDE 10 MG/ML IJ SOLN
60.0000 mg | Freq: Once | INTRAMUSCULAR | Status: AC
  Administered 2015-11-17: 60 mg via INTRAVENOUS
  Filled 2015-11-17: qty 6

## 2015-11-17 MED ORDER — IPRATROPIUM BROMIDE 0.02 % IN SOLN
0.5000 mg | Freq: Once | RESPIRATORY_TRACT | Status: AC
  Administered 2015-11-17: 0.5 mg via RESPIRATORY_TRACT
  Filled 2015-11-17: qty 2.5

## 2015-11-17 NOTE — ED Provider Notes (Signed)
CSN: [id number]     Arrival date & time 11/17/15  0033 History  By signing my name below, I, Altamease Oiler, attest that this documentation has been prepared under the direction and in the presence of Rolland Porter, MD  At [phone]. Electronically Signed: Altamease Oiler, ED Scribe. 11/17/2015. 1:02 AM   Chief Complaint  Patient presents with  . Shortness of Breath     The history is provided by the EMS personnel. No language interpreter was used.    Level V caveat for respiratory distress  Brought in by EMS from the [facility], KEILANI ANTE is a 74 y.o. female with history of HTN, DM, and stroke who presents to the Emergency Department complaining of respiratory distress with onset approximately 1 hour PTA. Per EMS the pt had oxygen saturation of 94% on her usual 3 L at the facility tonight. EMS states that patient's advanced directives, MOST,  stipulate full care with no intubation, CPR, or cardioversion. Patient is unable to talk due to her respiratory distress.  PCP Dr Wolfgang Phoenix  Past Medical History  Diagnosis Date  . Essential hypertension, benign   . Type 2 diabetes mellitus ([place])   . Osteoporosis 06/2008  . 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 History of parotid cancer    Past Surgical History  Procedure Laterality Date  . Ankle surgery Left 2004    otif  . Abdominal hysterectomy    . Anterior and posterior repair N/A 03/11/2013    Procedure: ANTERIOR (CYSTOCELE) AND POSTERIOR REPAIR (RECTOCELE);  Surgeon: Linda Hedges, DO;  Location: [facility];  Service: Gynecology;  Laterality: N/A;  with sacrospinous ligament fixation bilateral  . Colonoscopy  2009    negative  . Cataract extraction w/phaco Right 12/27/2014    Procedure: CATARACT EXTRACTION PHACO AND INTRAOCULAR LENS PLACEMENT RIGHT EYE;  Surgeon: Tonny Branch, MD;  Location: [facility];  Service: Ophthalmology;  Laterality: Right;  CDE:10.63  . Cataract extraction w/phaco Left 01/13/2015    Procedure: CATARACT EXTRACTION PHACO  AND INTRAOCULAR LENS PLACEMENT LEFT EYE;  Surgeon: Tonny Branch, MD;  Location: [facility];  Service: Ophthalmology;  Laterality: Left;  CDE:18.87  . Cholecystectomy    . Tee without cardioversion N/A 04/01/2015    Procedure: TRANSESOPHAGEAL ECHOCARDIOGRAM (TEE);  Surgeon: Sanda Klein, MD;  Location: [facility];  Service: Cardiovascular;  Laterality: N/A;  . Peg placement    . Parotidectomy     Family History  Problem Relation Age of Onset  . Cancer Father     Liver  . Cancer Sister     Breast  . Diabetes Brother   . Dementia Mother    Social History  Substance Use Topics  . Smoking status: Former Smoker -- 0.25 packs/day for 30 years    Types: Cigarettes    Quit date: 02/27/2015  . Smokeless tobacco: None  . Alcohol Use: No   She is in a nursing facility She wears oxygen 3 lpm [place]  OB History    No data available     Review of Systems  Unable to perform ROS: Severe respiratory distress      Allergies  Review of patient's allergies indicates no known allergies.  Home Medications   Prior to Admission medications   Medication Sig Start Date End Date Taking? Authorizing Provider  albuterol (PROVENTIL) (2.5 MG/3ML) 0.083% nebulizer solution Take 3 mLs (2.5 mg total) by nebulization every 2 (two) hours as needed for wheezing. 11/15/15  Yes Orvan Falconer, MD  aspirin  EC 81 MG EC tablet Take 1 tablet (81 mg total) by mouth daily. 11/02/15  Yes Estela Leonie Green, MD  clotrimazole-betamethasone (LOTRISONE) cream Apply 1 application topically daily as needed (APPLY A THIN LAYER AS BARRIER AFTER CLEANING AND DRYING BUTTOCKS AREA COMPLETELY).   Yes Historical Provider, MD  FLUoxetine (PROZAC) 10 MG capsule TAKE 1 CAPSULE BY MOUTH DAILY FOR DEPRESSION/ANXIETY Patient taking differently: TAKE 1 CAPSULE VIA PEG DAILY FOR DEPRESSION/ANXIETY 09/15/15  Yes [name], MD  furosemide (LASIX) 20 MG tablet 20 mg by PEG Tube route daily.   Yes Historical Provider, MD  levETIRAcetam  (KEPPRA) 750 MG tablet TAKE 1 TABLET BY MOUTH EVERY MORNING AND 2 TABLETS BY MOUTH EVERY NIGHT AT BEDTIME Patient taking differently: TAKE 1 TABLET VIA PEG TUBE IN THE MORNING & 2 TABLETS VIA PEG TUBE EVERY NIGHT AT BEDTIME 08/01/15  Yes [name], MD  metFORMIN (GLUCOPHAGE) 500 MG tablet Take one half tablet BID Patient taking differently: 250 mg by PEG Tube route 2 (two) times daily with a meal.  04/25/15  Yes [name], MD  metoprolol tartrate (LOPRESSOR) 25 MG tablet Take 25 mg by mouth 2 (two) times daily.   Yes Historical Provider, MD  Nutritional Supplements (FEEDING SUPPLEMENT, VITAL AF 1.2 CAL,) LIQD Place 1,000 mLs into feeding tube daily. 10/21/15  Yes [name], MD  pravastatin (PRAVACHOL) 40 MG tablet Place 1 tablet (40 mg total) into feeding tube every evening. 11/15/15  Yes Orvan Falconer, MD  warfarin (COUMADIN) 4 MG tablet Take 4 mg by mouth every evening.   Yes Historical Provider, MD  Water For Irrigation, Sterile (FREE WATER) SOLN Place 200 mLs into feeding tube every 8 (eight) hours. 10/21/15  Yes [name], MD   BP 185/94 mmHg  Pulse 113  Temp(Src) 97.9 F (36.6 C) (Oral)  Resp 26  Ht 5' (1.524 m)  Wt 85 lb (38.556 kg)  BMI 16.60 kg/m2  SpO2 100% Physical Exam  Constitutional:  Non-toxic appearance. She appears distressed.  Thin, chronically ill appearing female  HENT:  Head: Normocephalic and atraumatic.  Right Ear: External ear normal.  Left Ear: External ear normal.  Nose: Nose normal. No mucosal edema or rhinorrhea.  Mouth/Throat: Mucous membranes are normal. No dental abscesses or uvula swelling.  Eyes: Conjunctivae and EOM are normal. Pupils are equal, round, and reactive to light.  Neck: Normal range of motion and full passive range of motion without pain. Neck supple.  Cardiovascular: Regular rhythm and normal heart sounds.  Tachycardia present.  Exam reveals no gallop and no friction rub.   No murmur heard. Pulmonary/Chest: Accessory muscle usage  present. She is in respiratory distress. She has decreased breath sounds. She has wheezes. She has no rhonchi. She has no rales. She exhibits no tenderness and no crepitus.  Diminished breath sounds Scattered wheezing Retractions "Pectus excavatum"  Abdominal: Soft. Normal appearance and bowel sounds are normal. She exhibits no distension. There is no tenderness. There is no rebound and no guarding.  Musculoskeletal: Normal range of motion. She exhibits no edema or tenderness.  Moves all extremities well.   Neurological: She is alert. She has normal strength. No cranial nerve deficit.  Skin: Skin is warm, dry and intact. No rash noted. No erythema. No pallor.  Psychiatric: Her speech is normal. Her mood appears not anxious.  Nursing note and vitals reviewed.   ED Course  Procedures (including critical care time)  Medications  albuterol (PROVENTIL,VENTOLIN) solution continuous neb (10 mg/hr Nebulization  Given 11/17/15 0048)  ipratropium (ATROVENT) nebulizer solution 0.5 mg (0.5 mg Nebulization Given 11/17/15 0048)  furosemide (LASIX) injection 60 mg (60 mg Intravenous Given 11/17/15 0207)    DIAGNOSTIC STUDIES: Oxygen Saturation is 100% on 3L adequate by my interpretation.    COORDINATION OF CARE: 12:41 AM Treatment plan includes lab work, CXR and a breathing treatment .  Recheck at 1:30 AM patient is on a continuous nebulizer. Her breathing seems to be getting a little bit easier. Her family states they were called to the nursing home and when they arrived she was struggling to breathe, she was diaphoretic, and they were told her pulse ox had dropped down into the 60s and would come up into the 80s.  Recheck at 03:00 pt resting, lungs sound clear, states she feels better.   Recheck at 4:30 am, pt sleeping, pulse ox 100% on her oxygen. Lungs are clear. Discussed discharge back to her facility with family.   Labs Review Results for orders placed or performed during the hospital  encounter of 11/17/15  Comprehensive metabolic panel  Result Value Ref Range   Sodium 145 135 - 145 mmol/L   Potassium 3.8 3.5 - 5.1 mmol/L   Chloride 109 101 - 111 mmol/L   CO2 26 22 - 32 mmol/L   Glucose, Bld 191 (H) 65 - 99 mg/dL   BUN 30 (H) 6 - 20 mg/dL   Creatinine, Ser 0.80 0.44 - 1.00 mg/dL   Calcium 9.6 8.9 - 10.3 mg/dL   Total Protein 7.2 6.5 - 8.1 g/dL   Albumin 3.0 (L) 3.5 - 5.0 g/dL   AST 22 15 - 41 U/L   ALT 18 14 - 54 U/L   Alkaline Phosphatase 44 38 - 126 U/L   Total Bilirubin 0.5 0.3 - 1.2 mg/dL   GFR calc non Af Amer >60 >60 mL/min   GFR calc Af Amer >60 >60 mL/min   Anion gap 10 5 - 15  CBC with Differential  Result Value Ref Range   WBC 12.7 (H) 4.0 - 10.5 K/uL   RBC 3.71 (L) 3.87 - 5.11 MIL/uL   Hemoglobin 9.2 (L) 12.0 - 15.0 g/dL   HCT 30.2 (L) 36.0 - 46.0 %   MCV 81.4 78.0 - 100.0 fL   MCH 24.8 (L) 26.0 - 34.0 pg   MCHC 30.5 30.0 - 36.0 g/dL   RDW 16.5 (H) 11.5 - 15.5 %   Platelets 527 (H) 150 - 400 K/uL   Neutrophils Relative % 87 %   Neutro Abs 11.0 (H) 1.7 - 7.7 K/uL   Lymphocytes Relative 9 %   Lymphs Abs 1.2 0.7 - 4.0 K/uL   Monocytes Relative 3 %   Monocytes Absolute 0.4 0.1 - 1.0 K/uL   Eosinophils Relative 0 %   Eosinophils Absolute 0.0 0.0 - 0.7 K/uL   Basophils Relative 1 %   Basophils Absolute 0.1 0.0 - 0.1 K/uL  Brain natriuretic peptide  Result Value Ref Range   B Natriuretic Peptide >4500.0 (H) 0.0 - 100.0 pg/mL   Laboratory interpretation all normal except chronically elevated BNP, stable anemia, elevated BUN, leukocytosis   Imaging Review Dg Chest Port 1 View  11/17/2015  CLINICAL DATA:  75 year old female with increasing shortness of breath. EXAM: PORTABLE CHEST 1 VIEW COMPARISON:  Chest radiograph dated 11/14/2015 FINDINGS: Single-view of the chest demonstrate cardiomegaly with increased central vascular and interstitial prominence compatible with pulmonary edema. There is no focal consolidation or pneumothorax. A small  pleural effusion may  be present. Osseous structures are grossly unremarkable. IMPRESSION: Cardiomegaly with findings of pulmonary edema. Electronically Signed   By: Anner Crete M.D.   On: 11/17/2015 01:13   I have personally reviewed and evaluated these images and lab results as part of my medical decision-making.   EKG Interpretation   Date/Time:  Thursday November 17 2015 00:41:35 EST Ventricular Rate:  108 PR Interval:  87 QRS Duration: 83 QT Interval:  434 QTC Calculation: 582 R Axis:   118 Text Interpretation:  Sinus tachycardia Consider right atrial enlargement  Probable lateral infarct, age indeterminate Nonspecific T abnormalities,  lateral leads Prolonged QT interval Electrode noise No significant change  since last tracing 14 Nov 2015 Confirmed by [facility]  MD-I, Aysha Livecchi ([id number]) on  11/17/2015 2:32:56 AM      MDM   Final diagnoses:  Acute on chronic congestive heart failure, unspecified congestive heart failure type ([place])    Plan discharge  Rolland Porter, MD, FACEP   I personally performed the services described in this documentation, which was scribed in my presence. The recorded information has been reviewed and considered.  Rolland Porter, MD, Barbette Or, MD 11/17/15 [phone]

## 2015-11-17 NOTE — ED Notes (Signed)
Gave report to Hilda Blades, LPN at [facility], patient will be transported back to [facility] by ED Marlan Palau.

## 2015-11-17 NOTE — Discharge Instructions (Signed)
Double her lasix today, then the following day her usual dose. Recheck as needed.

## 2015-11-17 NOTE — ED Notes (Signed)
Per EMS called out for respiratory distress, patient has peg tub and after feeding patient started to have to have difficulty breathing, history of aspiration pneumonia.

## 2015-11-18 ENCOUNTER — Non-Acute Institutional Stay (SKILLED_NURSING_FACILITY): Payer: Medicare HMO | Admitting: Internal Medicine

## 2015-11-18 ENCOUNTER — Encounter (HOSPITAL_COMMUNITY)
Admission: RE | Admit: 2015-11-18 | Discharge: 2015-11-18 | Disposition: A | Discharge status: Home / Self Care | Payer: Medicare HMO | Source: Skilled Nursing Facility | Attending: Internal Medicine | Admitting: Internal Medicine

## 2015-11-18 ENCOUNTER — Ambulatory Visit: Payer: Medicare HMO | Admitting: Vascular Surgery

## 2015-11-18 ENCOUNTER — Encounter (HOSPITAL_COMMUNITY)
Admission: AD | Admit: 2015-11-18 | Discharge: 2015-11-18 | Disposition: A | Discharge status: Home / Self Care | Payer: Medicare HMO | Source: Skilled Nursing Facility | Attending: Internal Medicine | Admitting: Internal Medicine

## 2015-11-18 ENCOUNTER — Encounter: Payer: Self-pay | Admitting: Internal Medicine

## 2015-11-18 DIAGNOSIS — I5033 Acute on chronic diastolic (congestive) heart failure: Secondary | ICD-10-CM

## 2015-11-18 DIAGNOSIS — Z7901 Long term (current) use of anticoagulants: Secondary | ICD-10-CM | POA: Diagnosis not present

## 2015-11-18 DIAGNOSIS — I63412 Cerebral infarction due to embolism of left middle cerebral artery: Secondary | ICD-10-CM

## 2015-11-18 DIAGNOSIS — R21 Rash and other nonspecific skin eruption: Secondary | ICD-10-CM | POA: Diagnosis not present

## 2015-11-18 DIAGNOSIS — Z029 Encounter for administrative examinations, unspecified: Secondary | ICD-10-CM | POA: Diagnosis not present

## 2015-11-18 LAB — PROTIME-INR
INR: 1.83 — AB (ref 0.00–1.49)
PROTHROMBIN TIME: 21.1 s — AB (ref 11.6–15.2)

## 2015-11-19 ENCOUNTER — Non-Acute Institutional Stay (SKILLED_NURSING_FACILITY): Payer: Medicare HMO | Admitting: Internal Medicine

## 2015-11-19 ENCOUNTER — Other Ambulatory Visit (HOSPITAL_COMMUNITY)
Admission: RE | Admit: 2015-11-19 | Discharge: 2015-11-19 | Disposition: A | Discharge status: Home / Self Care | Payer: Medicare HMO | Source: Skilled Nursing Facility | Attending: Internal Medicine | Admitting: Internal Medicine

## 2015-11-19 DIAGNOSIS — I10 Essential (primary) hypertension: Secondary | ICD-10-CM | POA: Diagnosis present

## 2015-11-19 DIAGNOSIS — I5020 Unspecified systolic (congestive) heart failure: Secondary | ICD-10-CM | POA: Diagnosis not present

## 2015-11-19 LAB — BASIC METABOLIC PANEL
Anion gap: 10 (ref 5–15)
BUN: 34 mg/dL — AB (ref 6–20)
CALCIUM: 9.3 mg/dL (ref 8.9–10.3)
CHLORIDE: 106 mmol/L (ref 101–111)
CO2: 27 mmol/L (ref 22–32)
CREATININE: 0.82 mg/dL (ref 0.44–1.00)
GFR calc non Af Amer: >60 mL/min (ref >60)
GLUCOSE: 145 mg/dL — AB (ref 65–99)
Potassium: 4.3 mmol/L (ref 3.5–5.1)
Sodium: 143 mmol/L (ref 135–145)

## 2015-11-19 LAB — CBC WITH DIFFERENTIAL/PLATELET
BASOS ABS: 0.1 10*3/uL (ref 0.0–0.1)
BASOS PCT: 1 %
EOS ABS: 0.2 10*3/uL (ref 0.0–0.7)
EOS PCT: 2 %
HCT: 32 % — ABNORMAL LOW (ref 36.0–46.0)
Hemoglobin: 9.8 g/dL — ABNORMAL LOW (ref 12.0–15.0)
Lymphocytes Relative: 18 %
Lymphs Abs: 1.7 10*3/uL (ref 0.7–4.0)
MCH: 24.8 pg — ABNORMAL LOW (ref 26.0–34.0)
MCHC: 30.6 g/dL (ref 30.0–36.0)
MCV: 81 fL (ref 78.0–100.0)
MONO ABS: 0.4 10*3/uL (ref 0.1–1.0)
Monocytes Relative: 4 %
NEUTROS ABS: 7 10*3/uL (ref 1.7–7.7)
Neutrophils Relative %: 75 %
PLATELETS: 503 10*3/uL — AB (ref 150–400)
RBC: 3.95 MIL/uL (ref 3.87–5.11)
RDW: 16.5 % — AB (ref 11.5–15.5)
WBC: 9.2 10*3/uL (ref 4.0–10.5)

## 2015-11-19 LAB — PROTIME-INR
INR: 2.29 — AB (ref 0.00–1.49)
PROTHROMBIN TIME: 25 s — AB (ref 11.6–15.2)

## 2015-11-19 NOTE — Progress Notes (Signed)
Patient ID: Laura Melendez, female   DOB: [birth year], 74 y.o.   MRN: [id number] Facility; [facility] Chief complaint follow-up shortness of breath History; the patient was over in the emergency room on 12 1 apparently sent there by on-call for increasing shortness of breath. Chest x-ray suggested mild pulmonary edema. BNP was over 4500. She was given 40 mg of Lasix and then the dose was reduced back to 20 mg a day. I think the correct response here is to increase the Lasix. She is receiving a large fluid challenge.  BMP Latest Ref Rng 11/19/2015 11/17/2015 11/14/2015  Glucose 65 - 99 mg/dL 145(H) 191(H) 154(H)  BUN 6 - 20 mg/dL 34(H) 30(H) 33(H)  Creatinine 0.44 - 1.00 mg/dL 0.82 0.80 0.89  BUN/Creat Ratio 11 - 26 - - -  Sodium 135 - 145 mmol/L 143 145 142  Potassium 3.5 - 5.1 mmol/L 4.3 3.8 3.7  Chloride 101 - 111 mmol/L 106 109 110  CO2 22 - 32 mmol/L 27 26 27   Calcium 8.9 - 10.3 mg/dL 9.3 9.6 8.7(L)   BNP (last 3 results)  Recent Labs  11/06/15 0503 11/09/15 0428 11/17/15 0110  BNP 3854.0* >4500.0* >4500.0*   Past Medical History  Diagnosis Date  . Essential hypertension, benign   . Type 2 diabetes mellitus ([place])   . Osteoporosis 06/2008  . 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 History of parotid cancer    Review of systems is not really possible from the patient at this point however her family is concerned about vaginal itching/irritation  Physical examination Gen. patient is not in any distress although somewhat tachypneic. Respiratory; significant thoracic kyphosis but otherwise her lungs are clear Cardiac heart sounds are normal no murmurs no gallops. JVP is not visibly elevated Abdomen no liver no spleen no masses GU no suprapubic or costovertebral angle tenderness Foley catheter is noted Extremities no evidence of a DVT there is no edema no coccyx edema.  Impression/plan #1 probably some degree of congestive heart failure related to fluid overload. She will need  more diuretic I'm going to give her next her 40 mg today and increase this to 40 mg daily. Although I had some concern with a history about recurrent aspiration I don't think that is the issue here. I personally reviewed the chest x-ray on epic and I agree with the interpretation                       *[place] [address]                        [place], [place] [address]                            [id number]  ------------------------------------------------------------------- Transthoracic Echocardiography  Patient:    Laura, Melendez MR #:       [id number] Study Date: 11/09/2015 Gender:     F Age:        30 Height:     167.6 cm Weight:     40.8 kg BSA:        1.36 m^2 Pt. Status: Room:       IC05   ADMITTING    Oswald Hillock  Berna Spare, M.D.  REFERRING    Kerry Hough, M.D.  ATTENDING    Horton, Barbette Hair  PERFORMING   Chmg, Forestine Na  SONOGRAPHER  Alvino Chapel, RCS  cc:  ------------------------------------------------------------------- LV EF: 50% -   55%  ------------------------------------------------------------------- Indications:      Dyspnea 786.09.  ------------------------------------------------------------------- History:   PMH:  Cerebral Infarction, Acute Respiratory Failure with Hypoxia.  Congestive heart failure.  Risk factors: Hypertension. Diabetes mellitus. Dyslipidemia.  ------------------------------------------------------------------- Study Conclusions  - Left ventricle: The cavity size was normal. Wall thickness was   increased in a pattern of mild LVH. Systolic function was normal.   The estimated ejection fraction was in the range of 50% to 55%.   Indeterminate diastolic function. Wall motion was normal; there   were no regional wall motion abnormalities. - Aortic valve: Mildly calcified annulus. Trileaflet; mildly   thickened leaflets. Valve area (VTI): 1.9 cm^2.  Valve area   (Vmax): 1.87 cm^2. - Left atrium: The atrium was severely dilated. - Pericardium, extracardiac: There is a large left pleural   effusion. - Technically adequate study.  Transthoracic echocardiography.  M-mode, complete 2D, spectral Doppler, and color Doppler.  Birthdate:  Patient birthdate: [birth year].  Age:  Patient is 74 yr old.  Sex:  Gender: female. BMI: 14.5 kg/m^2.  Blood pressure:     138/73  Patient status: Inpatient.  Study date:  Study date: 11/09/2015. Study time: 10:01 AM.  Location:  ICU/CCU  -------------------------------------------------------------------  ------------------------------------------------------------------- Left ventricle:  The cavity size was normal. Wall thickness was increased in a pattern of mild LVH. Systolic function was normal. The estimated ejection fraction was in the range of 50% to 55%. Indeterminate diastolic function. Wall motion was normal; there were no regional wall motion abnormalities.  ------------------------------------------------------------------- Aortic valve:   Mildly calcified annulus. Trileaflet; mildly thickened leaflets.  Doppler:   There was no stenosis.   There was no significant regurgitation.    VTI ratio of LVOT to aortic valve: 0.6. Valve area (VTI): 1.9 cm^2. Indexed valve area (VTI): 1.4 cm^2/m^2. Peak velocity ratio of LVOT to aortic valve: 0.59. Valve area (Vmax): 1.87 cm^2. Indexed valve area (Vmax): 1.38 cm^2/m^2.  Mean gradient (S): 7 mm Hg. Peak gradient (S): 13 mm Hg.  ------------------------------------------------------------------- Aorta:  Aortic root: The aortic root was normal in size.  ------------------------------------------------------------------- Mitral valve:   Normal thickness leaflets .  Doppler:   There was no evidence for stenosis.   There was trivial regurgitation. Peak gradient (D): 7 mm Hg.  ------------------------------------------------------------------- Left  atrium:  The atrium was severely dilated.  ------------------------------------------------------------------- Right ventricle:  The cavity size was normal. Systolic function was normal.  ------------------------------------------------------------------- Pulmonic valve:   Not well visualized.  Doppler:   There was no evidence for stenosis.   There was no significant regurgitation.   ------------------------------------------------------------------- Tricuspid valve:   Normal thickness leaflets.  Doppler:   There was no evidence for stenosis.   There was no significant regurgitation.   ------------------------------------------------------------------- Pulmonary artery:    Systolic pressure could not be accurately estimated.   Inadequate TR jet.  ------------------------------------------------------------------- Right atrium:  The atrium was normal in size.  ------------------------------------------------------------------- Pericardium:  There is a large left pleural effusion. There was no pericardial effusion.  ------------------------------------------------------------------- Systemic veins: Inferior vena cava: The vessel was normal in size. The respirophasic diameter changes were in the normal range (>= 50%), consistent with normal central venous pressure.      Study Result  CLINICAL DATA:  74 year old female with increasing shortness of breath.   EXAM: PORTABLE CHEST 1 VIEW   COMPARISON:  Chest radiograph dated 11/14/2015   FINDINGS: Single-view of the chest demonstrate cardiomegaly with increased central vascular and interstitial prominence compatible with pulmonary edema. There is no focal consolidation or pneumothorax. A small pleural effusion may be present. Osseous structures are grossly unremarkable.   IMPRESSION: Cardiomegaly with findings of pulmonary edema.     Electronically Signed   By: Anner Crete M.D.   On: 11/17/2015 01:13

## 2015-11-20 DIAGNOSIS — R21 Rash and other nonspecific skin eruption: Secondary | ICD-10-CM | POA: Insufficient documentation

## 2015-11-20 DIAGNOSIS — Z7901 Long term (current) use of anticoagulants: Secondary | ICD-10-CM | POA: Insufficient documentation

## 2015-11-20 NOTE — Progress Notes (Signed)
Patient ID: Laura Melendez, female   DOB: 09-02-1941, 74 y.o.   MRN: [id number]                    DATE:  11/18/2015       FACILITY: [facility]                 LEVEL OF CARE:   SNF                   CHIEF COMPLAINT:  Acute visit status post ER visit with diagnosis of pulmonary edema --also anticoagulation management on Coumadin          HISTORY OF PRESENT ILLNESS:    , this was initially a patient who went for an elective parotidectomy at [facility], I believe in early November.  She had a rocky postoperative course which included pneumonia, respiratory failure, required support in the ICU, she had gram-negative pneumonia, a postoperative hematoma which required a second surgery.    By the time she stabilized, she could not swallow.   Just before she was initially sent here, she apparently passed a FEES study and had a diet.  However, she has never been able to swallow here and has remained essentially PEG tube dependent.  The PEG tube was initially put in at [facility] before coming here.    She was in the hospital from 10/29/2015 through 11/02/2015 with acute respiratory failure, felt to be secondary to CHF and possible aspiration.  She required BiPAP.r when she came back and she actually looked fairly stable.  Nevertheless, I think that night she was sent back to hospital in respiratory distress again, difficulties before the parotidectomy.    Finally, her status, including her physical status, generally declined with marked diffuse weakness, all of this complicated by major depression, making bedside neurologic assessment difficult.  At one point, Dr. Dellia Nims had considered not only a repeat stroke but some form of neuromuscular illness.  However, it appears that this may have just been profound deconditioning, .    Nevertheless, recently she was sent back to hospital with aspiration pneumonia.  She was put in the ICU.  I do not believe she was intubated.  She was felt  to have aspiration pneumonia, decompensated diastolic heart failure, and acute respiratory failure with hypoxemia.   She was seen by Cardiology and Pulmonary.   She was taken off BiPAP.  Palliative Care consult was undertaken.  In fact, at one point I think we had heard that she was going to be sent to Hospice.  However, this does not seem to have happened.     She returned to the facility-however apparently early on the morning of December 1 she went back to the ER in respiratory distress-per review of ER noted appears she was given nebulizers and an additional Lasix and this apparently fairly quickly stabilized and she has return the facility.  She continues on her baseline 20 mg of Lasix she does not complain of increased shortness of breath today appears to be relatively stable considering her extreme fragile status  Patient also is on chronic Coumadin I believe for a history of embolic stroke-she currently is on 4 mg a day INR is gradually rising 1.83 today we will continue her on that same dose and recheck this tomorrow.  Marland Kitchen    PAST MEDICAL HISTORY/PROBLEM LIST:   Reviewed.     PAST SURGICAL HISTORY:   Reviewed.      CURRENT  MEDICATIONS:  Medication list is reviewed .    Coumadin 4 mg.    Lasix 20 mg.    Keppra 750 in the morning, 2 tablets at night.   She is on aspirin 81 mg a day.    Glucophage 500 mg twice a day.    Lopressor 25 mg twice a day.    Pravastatin 40 mg daily at bedtime.    Prozac 10 mg daily   *Ritalin discontinued.    *Reglan discontinued.     SOCIAL HISTORY:  Reviewed.                HOUSING:  Prior to all this, she was living with her husband.   FUNCTIONAL STATUS:  They paint her as quite functional after this stroke in April.    FAMILY HISTORY:   Reviewed.     REVIEW OF SYSTEMS:       HEENT:   No headache or dizziness.    CHEST/RESPIRATORY:  No clear cough or shortness of breath.   CARDIAC:  No chest pain.    GI:  No abdominal pain.      Has a a PEG tube    GU:  She has a Foley catheter in place for urinary retention.   I    MUSCULOSKELETAL:  Still generalized weakness   PSYCHIATRIC:  Mental status: She is able to carry conversation although very weak     PHYSICAL EXAMINATION:   Temperature 98.7 pulse 85 respirations 20 blood pressure 149/73-119/52 recently in this range GENERAL APPEARANCE:  The patient is not in any distress.  Her skin is warm and dry she does have an erythematous rash of her vaginal area I do not see any active discharge             HEENT:   MOUTH/THROAT--  somewhatdry mucous membranes  There are no oral lesions.       .     CHEST/RESPIRATORY:  Very shallow air entry, but no crackles or wheezes.       CARDIOVASCULAR:   CARDIAC:  Heart sounds are normal.  No signs of heart failure.  She appears to be euvolemic no significant edema.      GASTROINTESTINAL:   ABDOMEN:  Soft.   PEG site with a Foley catheter in.  Not tender.     Marland Kitchen   GENITOURINARY:   BLADDER:  Foley catheter in.   No suprapubic or costovertebral angle tenderness.-As noted above    CIRCULATION:   EDEMA/VARICOSITIES:  Extremities:  She has no edema.  No signs of a DVT.   NEUROLOGICAL:   CRANIAL NERVES:  She has full extraocular movements Continues with mild left facial weakness and right sided extremity weakness which is not new    Psych she appears to be more bright and alert able to carry conversation although very frail.  Labs.  This ever second 2016.  INR 1.83.  11/17/2015.  Sodium 145 potassium 3.8 BUN 30 creatinine 0.8.  Albumin 3.0.  WBC 12.7 hemoglobin 9.2 platelets 527.  BNP greater than 4500    ASSESSMENT/PLAN:              Respiratory failure secondary to aspiration pneumonia, diastolic heart failure.    Again a very challenging situation-she did receive increase Lasix in the ER apparently with improvement-consider increasing her Lasix routinely here will discuss this with Dr. Dellia Nims will be in the facility  tomorrow      .     On chronic Coumadin  4 mg. INR is trending up now near therapeutic range will update this tomorrow.    #3 history of vaginal rash Will treat with Nizoral cream twice a day and monitor.     CPT CODE: 09811

## 2015-11-20 NOTE — Progress Notes (Signed)
Patient ID: Laura Melendez, female   DOB: January 18, 1941, 74 y.o.   MRN: [id number]               HISTORY & PHYSICAL  DATE:  11/16/2015       FACILITY: [facility]                 LEVEL OF CARE:   SNF   ADMISSION DATES: Readmit from [place] from   10/29/2015 through 11/02/2015.                 CHIEF COMPLAINT:  This is one of several readmissions to the facility, coming from [facility]:    In brief, this was initially a patient who went for an elective parotidectomy at [facility], I believe in early November.  She had a rocky postoperative course which included pneumonia, respiratory failure, required support in the ICU, she had gram-negative pneumonia, a postoperative hematoma which required a second surgery.    By the time she stabilized, she could not swallow.   Just before she was initially sent here, she apparently passed a FEES study and had a diet.  However, she has never been able to swallow here and has remained essentially PEG tube dependent.  The PEG tube was initially put in at [facility] before coming here.    She was in the hospital from 10/29/2015 through 11/02/2015 with acute respiratory failure, felt to be secondary to CHF and possible aspiration.  She required BiPAP.  I saw her when she came back and she actually looked fairly stable.  Nevertheless, I think that night she was sent back to hospital in respiratory distress again, difficulties before the parotidectomy.    Finally, her status, including her physical status, generally declined with marked diffuse weakness, all of this complicated by major depression, making bedside neurologic assessment difficult.  At one point, I had considered not only a repeat stroke but some form of neuromuscular illness.  However, it appears that this may have just been profound deconditioning, perhaps on a baseline not as stable as the family presented.    Nevertheless, on this occasion  she was sent back to hospital with aspiration pneumonia.  She was put in the ICU.  I do not believe she was intubated.  She was felt to have aspiration pneumonia, decompensated diastolic heart failure, and acute respiratory failure with hypoxemia.   She was seen by Cardiology and Pulmonary.   She was taken off BiPAP.  Palliative Care consult was undertaken.  In fact, at one point I think we had heard that she was going to be sent to Hospice.  However, this does not seem to have happened.    She had a period of unresponsiveness during the hospitalization.  However, her EEG was normal.  CT scan of the head was negative.  She had a Keppra level, although I am not sure how that would help.   Her tube feeding was resumed and she became more alert.  There was a suspicion for seizure.    Past Medical History  Diagnosis Date  . Essential hypertension, benign   . Type 2 diabetes mellitus ([place])   . Osteoporosis 06/2008  . 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 History of parotid cancer    Past Surgical History  Procedure Laterality Date  . Ankle surgery Left 2004    otif  .  Abdominal hysterectomy    . Anterior and posterior repair N/A 03/11/2013    Procedure: ANTERIOR (CYSTOCELE) AND POSTERIOR REPAIR (RECTOCELE);  Surgeon: Linda Hedges, DO;  Location: [facility];  Service: Gynecology;  Laterality: N/A;  with sacrospinous ligament fixation bilateral  . Colonoscopy  2009    negative  . Cataract extraction w/phaco Right 12/27/2014    Procedure: CATARACT EXTRACTION PHACO AND INTRAOCULAR LENS PLACEMENT RIGHT EYE;  Surgeon: Tonny Branch, MD;  Location: [facility];  Service: Ophthalmology;  Laterality: Right;  CDE:10.63  . Cataract extraction w/phaco Left 01/13/2015    Procedure: CATARACT EXTRACTION PHACO AND INTRAOCULAR LENS PLACEMENT LEFT EYE;  Surgeon: Tonny Branch, MD;  Location: [facility];  Service: Ophthalmology;  Laterality: Left;  CDE:18.87  . Cholecystectomy    . Tee without cardioversion N/A 04/01/2015     Procedure: TRANSESOPHAGEAL ECHOCARDIOGRAM (TEE);  Surgeon: Sanda Klein, MD;  Location: [facility] ENDOSCOPY;  Service: Cardiovascular;  Laterality: N/A;  . Peg placement    . Parotidectomy    . Peg placement N/A 11/16/2015    Procedure: PERCUTANEOUS ENDOSCOPIC GASTROSTOMY (PEG) REPLACEMENT;  Surgeon: Rogene Houston, MD;  Location: [facility];  Service: Endoscopy;  Laterality: N/A;         CURRENT MEDICATIONS:  Medication list is . Current Outpatient Prescriptions on File Prior to Visit  Medication Sig Dispense Refill  . albuterol (PROVENTIL) (2.5 MG/3ML) 0.083% nebulizer solution Take 3 mLs (2.5 mg total) by nebulization every 2 (two) hours as needed for wheezing. 75 mL 12  . aspirin EC 81 MG EC tablet Take 1 tablet (81 mg total) by mouth daily.    . clotrimazole-betamethasone (LOTRISONE) cream Apply 1 application topically daily as needed (APPLY A THIN LAYER AS BARRIER AFTER CLEANING AND DRYING BUTTOCKS AREA COMPLETELY).    Marland Kitchen FLUoxetine (PROZAC) 10 MG capsule TAKE 1 CAPSULE BY MOUTH DAILY FOR DEPRESSION/ANXIETY (Patient taking differently: TAKE 1 CAPSULE VIA PEG DAILY FOR DEPRESSION/ANXIETY) 90 capsule 1  . furosemide (LASIX) 20 MG tablet 20 mg by PEG Tube route daily.    Marland Kitchen levETIRAcetam (KEPPRA) 750 MG tablet TAKE 1 TABLET BY MOUTH EVERY MORNING AND 2 TABLETS BY MOUTH EVERY NIGHT AT BEDTIME (Patient taking differently: TAKE 1 TABLET VIA PEG TUBE IN THE MORNING & 2 TABLETS VIA PEG TUBE EVERY NIGHT AT BEDTIME) 270 tablet 3  . metFORMIN (GLUCOPHAGE) 500 MG tablet Take one half tablet BID (Patient taking differently: 250 mg by PEG Tube route 2 (two) times daily with a meal. ) 30 tablet 5  . metoprolol tartrate (LOPRESSOR) 25 MG tablet Take 25 mg by mouth 2 (two) times daily.    . Nutritional Supplements (FEEDING SUPPLEMENT, VITAL AF 1.2 CAL,) LIQD Place 1,000 mLs into feeding tube daily.    . pravastatin (PRAVACHOL) 40 MG tablet Place 1 tablet (40 mg total) into feeding tube every evening. 30  tablet 1  . warfarin (COUMADIN) 4 MG tablet Take 4 mg by mouth every evening.    . Water For Irrigation, Sterile (FREE WATER) SOLN Place 200 mLs into feeding tube every 8 (eight) hours.    *Ritalin discontinued.    *Reglan discontinued.     SOCIAL HISTORY:  Reviewed.                HOUSING:  Prior to all this, she was living with her husband.   FUNCTIONAL STATUS:  They paint her as quite functional after this stroke in April.    FAMILY HISTORY:   Reviewed.  REVIEW OF SYSTEMS:       HEENT:   No headache or dizziness.    CHEST/RESPIRATORY:  No clear cough or shortness of breath.   CARDIAC:  No chest pain.    GI:  No abdominal pain.   Unfortunately, she apparently had some sort of fall overnight.   Her PEG tube came out.  A Foley catheter is in place.  We will replace that today.    GU:  She has a Foley catheter in place for urinary retention.   I will need to research this.   MUSCULOSKELETAL:  Still generalized weakness although once again, like the last time I saw her, she seems better than perhaps three weeks ago.   PSYCHIATRIC:  Mental status:  She is brighter, more alert.  Attempts at conversation.     PHYSICAL EXAMINATION:   GENERAL APPEARANCE:  The patient is not in any distress.              HEENT:   MOUTH/THROAT:  Mucous membranes are dry.  There are no oral lesions.      NECK/THYROID:  No thyroid palpable.   LYMPHATICS:   None palpable in the cervical, clavicular, or axillary areas.     CHEST/RESPIRATORY:  Very shallow air entry, but no crackles or wheezes.       CARDIOVASCULAR:   CARDIAC:  Heart sounds are normal.  No signs of heart failure.  She appears to be euvolemic.      GASTROINTESTINAL:   ABDOMEN:  Soft.   PEG site with a Foley catheter in.  Not tender.    LIVER/SPLEEN/KIDNEY:   No liver, no spleen.   GENITOURINARY:   BLADDER:  Foley catheter in.   No suprapubic or costovertebral angle tenderness.    CIRCULATION:   EDEMA/VARICOSITIES:  Extremities:  She has no  edema.  No signs of a DVT.   NEUROLOGICAL:   CRANIAL NERVES:  She has full extraocular movements.  No upgaze.  Mild left facial weakness.   Otherwise, her cranial nerves seem intact.  Jaw jerk is normal.   SENSATION/STRENGTH:  She has weakness of the right arm versus the left.  In her lower extremities, she has 3+/5 diffuse strength.   DEEP TENDON REFLEXES:  Reflexes intact.  Toes downgoing.   BALANCE/GAIT:   PSYCHIATRIC:   MENTAL STATUS:   Again, she is brighter, less depressed-looking.  At one point, she would not even open her eyes and would not talk.  This is an improvement.    ASSESSMENT/PLAN:              Respiratory failure secondary to aspiration pneumonia, diastolic heart failure.  All of this seems improved.  With regards to the heart failure, we will need to keep a closer eye on this Currenlty on lasix 20mg    There was some suggestion that we did not keep her bed at 45 and I have emphasized this to the staff.     Dysphagia.  I suppose the simplest explanation is that this is a holdover from her past stroke in April of this year.  This is not what the family described.  Nevertheless, she has not been able to safely initiate a swallow and I am very reticent to do anything but allow Speech Therapy to work with her.    Generalized weakness.   At one point, I was really concerned that this woman had had a brainstem stroke and at one point some form of neuromuscular issue.  However,  I am less concerned about that now.    Probable major depression.  This is improved.  This will also need to be monitored.    Diabetes.     On chronic Coumadin 4 mg.  I do not see any lab work on her, but apparently this has been done.     CPT CODE: 16109

## 2015-11-21 ENCOUNTER — Encounter (HOSPITAL_COMMUNITY): Payer: Self-pay | Admitting: Internal Medicine

## 2015-11-21 ENCOUNTER — Encounter (HOSPITAL_COMMUNITY)
Admission: RE | Admit: 2015-11-21 | Discharge: 2015-11-21 | Disposition: A | Discharge status: Home / Self Care | Payer: Medicare HMO | Source: Skilled Nursing Facility | Attending: Internal Medicine | Admitting: Internal Medicine

## 2015-11-21 DIAGNOSIS — Z029 Encounter for administrative examinations, unspecified: Secondary | ICD-10-CM | POA: Diagnosis not present

## 2015-11-21 LAB — CBC WITH DIFFERENTIAL/PLATELET
Basophils Absolute: 0.1 10*3/uL (ref 0.0–0.1)
Basophils Relative: 1 %
Eosinophils Absolute: 0.3 10*3/uL (ref 0.0–0.7)
Eosinophils Relative: 3 %
HEMATOCRIT: 29.4 % — AB (ref 36.0–46.0)
HEMOGLOBIN: 9 g/dL — AB (ref 12.0–15.0)
LYMPHS ABS: 1.9 10*3/uL (ref 0.7–4.0)
LYMPHS PCT: 19 %
MCH: 24.9 pg — AB (ref 26.0–34.0)
MCHC: 30.6 g/dL (ref 30.0–36.0)
MCV: 81.4 fL (ref 78.0–100.0)
MONO ABS: 0.7 10*3/uL (ref 0.1–1.0)
MONOS PCT: 7 %
NEUTROS ABS: 7.4 10*3/uL (ref 1.7–7.7)
NEUTROS PCT: 70 %
Platelets: 455 10*3/uL — ABNORMAL HIGH (ref 150–400)
RBC: 3.61 MIL/uL — ABNORMAL LOW (ref 3.87–5.11)
RDW: 16.7 % — AB (ref 11.5–15.5)
WBC: 10.4 10*3/uL (ref 4.0–10.5)

## 2015-11-21 LAB — BASIC METABOLIC PANEL
ANION GAP: 7 (ref 5–15)
BUN: 43 mg/dL — ABNORMAL HIGH (ref 6–20)
CALCIUM: 9.1 mg/dL (ref 8.9–10.3)
CO2: 26 mmol/L (ref 22–32)
Chloride: 110 mmol/L (ref 101–111)
Creatinine, Ser: 0.84 mg/dL (ref 0.44–1.00)
GFR calc Af Amer: >60 mL/min (ref >60)
GFR calc non Af Amer: >60 mL/min (ref >60)
GLUCOSE: 142 mg/dL — AB (ref 65–99)
Potassium: 4.4 mmol/L (ref 3.5–5.1)
Sodium: 143 mmol/L (ref 135–145)

## 2015-11-22 ENCOUNTER — Non-Acute Institutional Stay (SKILLED_NURSING_FACILITY): Payer: Medicare HMO | Admitting: Internal Medicine

## 2015-11-22 ENCOUNTER — Other Ambulatory Visit: Payer: Self-pay

## 2015-11-22 ENCOUNTER — Emergency Department (HOSPITAL_COMMUNITY)
Admission: EM | Admit: 2015-11-22 | Discharge: 2015-11-23 | Disposition: A | Discharge status: Home / Self Care | Payer: Medicare HMO | Source: Home / Self Care | Attending: Emergency Medicine | Admitting: Emergency Medicine

## 2015-11-22 ENCOUNTER — Encounter (HOSPITAL_COMMUNITY): Payer: Self-pay | Admitting: *Deleted

## 2015-11-22 ENCOUNTER — Emergency Department (HOSPITAL_COMMUNITY): Payer: Medicare HMO

## 2015-11-22 DIAGNOSIS — I5023 Acute on chronic systolic (congestive) heart failure: Secondary | ICD-10-CM | POA: Insufficient documentation

## 2015-11-22 DIAGNOSIS — Z8673 Personal history of transient ischemic attack (TIA), and cerebral infarction without residual deficits: Secondary | ICD-10-CM

## 2015-11-22 DIAGNOSIS — Z7984 Long term (current) use of oral hypoglycemic drugs: Secondary | ICD-10-CM

## 2015-11-22 DIAGNOSIS — Z79899 Other long term (current) drug therapy: Secondary | ICD-10-CM | POA: Insufficient documentation

## 2015-11-22 DIAGNOSIS — F322 Major depressive disorder, single episode, severe without psychotic features: Secondary | ICD-10-CM

## 2015-11-22 DIAGNOSIS — R231 Pallor: Secondary | ICD-10-CM

## 2015-11-22 DIAGNOSIS — Z87891 Personal history of nicotine dependence: Secondary | ICD-10-CM

## 2015-11-22 DIAGNOSIS — Z7982 Long term (current) use of aspirin: Secondary | ICD-10-CM

## 2015-11-22 DIAGNOSIS — I10 Essential (primary) hypertension: Secondary | ICD-10-CM

## 2015-11-22 DIAGNOSIS — R0602 Shortness of breath: Secondary | ICD-10-CM | POA: Diagnosis not present

## 2015-11-22 DIAGNOSIS — Z8739 Personal history of other diseases of the musculoskeletal system and connective tissue: Secondary | ICD-10-CM

## 2015-11-22 DIAGNOSIS — E119 Type 2 diabetes mellitus without complications: Secondary | ICD-10-CM

## 2015-11-22 DIAGNOSIS — Z85858 Personal history of malignant neoplasm of other endocrine glands: Secondary | ICD-10-CM | POA: Insufficient documentation

## 2015-11-22 DIAGNOSIS — I5020 Unspecified systolic (congestive) heart failure: Secondary | ICD-10-CM

## 2015-11-22 DIAGNOSIS — I110 Hypertensive heart disease with heart failure: Secondary | ICD-10-CM | POA: Diagnosis not present

## 2015-11-22 DIAGNOSIS — Z7901 Long term (current) use of anticoagulants: Secondary | ICD-10-CM

## 2015-11-22 DIAGNOSIS — R1314 Dysphagia, pharyngoesophageal phase: Secondary | ICD-10-CM | POA: Diagnosis not present

## 2015-11-22 DIAGNOSIS — N390 Urinary tract infection, site not specified: Secondary | ICD-10-CM | POA: Insufficient documentation

## 2015-11-22 LAB — I-STAT TROPONIN, ED: TROPONIN I, POC: 0.06 ng/mL (ref 0.00–0.08)

## 2015-11-22 LAB — CBC WITH DIFFERENTIAL/PLATELET
BASOS ABS: 0.1 10*3/uL (ref 0.0–0.1)
Basophils Relative: 1 %
EOS ABS: 0.1 10*3/uL (ref 0.0–0.7)
EOS PCT: 0 %
HCT: 29.8 % — ABNORMAL LOW (ref 36.0–46.0)
Hemoglobin: 9.1 g/dL — ABNORMAL LOW (ref 12.0–15.0)
Lymphocytes Relative: 7 %
Lymphs Abs: 1 10*3/uL (ref 0.7–4.0)
MCH: 24.7 pg — AB (ref 26.0–34.0)
MCHC: 30.5 g/dL (ref 30.0–36.0)
MCV: 80.8 fL (ref 78.0–100.0)
MONO ABS: 0.6 10*3/uL (ref 0.1–1.0)
Monocytes Relative: 4 %
Neutro Abs: 12.9 10*3/uL — ABNORMAL HIGH (ref 1.7–7.7)
Neutrophils Relative %: 88 %
PLATELETS: 433 10*3/uL — AB (ref 150–400)
RBC: 3.69 MIL/uL — AB (ref 3.87–5.11)
RDW: 16.8 % — AB (ref 11.5–15.5)
WBC: 14.6 10*3/uL — AB (ref 4.0–10.5)

## 2015-11-22 LAB — COMPREHENSIVE METABOLIC PANEL
ALT: 17 U/L (ref 14–54)
AST: 23 U/L (ref 15–41)
Albumin: 2.9 g/dL — ABNORMAL LOW (ref 3.5–5.0)
Alkaline Phosphatase: 41 U/L (ref 38–126)
Anion gap: 14 (ref 5–15)
BUN: 46 mg/dL — ABNORMAL HIGH (ref 6–20)
CHLORIDE: 100 mmol/L — AB (ref 101–111)
CO2: 25 mmol/L (ref 22–32)
CREATININE: 0.86 mg/dL (ref 0.44–1.00)
Calcium: 9.4 mg/dL (ref 8.9–10.3)
Glucose, Bld: 205 mg/dL — ABNORMAL HIGH (ref 65–99)
POTASSIUM: 4.4 mmol/L (ref 3.5–5.1)
SODIUM: 139 mmol/L (ref 135–145)
Total Bilirubin: 0.3 mg/dL (ref 0.3–1.2)
Total Protein: 6.7 g/dL (ref 6.5–8.1)

## 2015-11-22 LAB — URINALYSIS, ROUTINE W REFLEX MICROSCOPIC
BILIRUBIN URINE: NEGATIVE
GLUCOSE, UA: NEGATIVE mg/dL
Ketones, ur: NEGATIVE mg/dL
Nitrite: POSITIVE — AB
Specific Gravity, Urine: 1.025 (ref 1.005–1.030)
pH: 5.5 (ref 5.0–8.0)

## 2015-11-22 LAB — URINE MICROSCOPIC-ADD ON

## 2015-11-22 LAB — BRAIN NATRIURETIC PEPTIDE: B NATRIURETIC PEPTIDE 5: 2931 pg/mL — AB (ref 0.0–100.0)

## 2015-11-22 MED ORDER — DEXTROSE 5 % IV SOLN
1.0000 g | Freq: Once | INTRAVENOUS | Status: AC
  Administered 2015-11-23: 1 g via INTRAVENOUS
  Filled 2015-11-22: qty 10

## 2015-11-22 MED ORDER — IPRATROPIUM-ALBUTEROL 0.5-2.5 (3) MG/3ML IN SOLN
3.0000 mL | Freq: Once | RESPIRATORY_TRACT | Status: AC
  Administered 2015-11-22: 3 mL via RESPIRATORY_TRACT
  Filled 2015-11-22: qty 3

## 2015-11-22 MED ORDER — ALBUTEROL SULFATE (2.5 MG/3ML) 0.083% IN NEBU: 5.0000 mg | INHALATION_SOLUTION | Freq: Once | RESPIRATORY_TRACT | Status: DC

## 2015-11-22 MED ORDER — FUROSEMIDE 10 MG/ML IJ SOLN
40.0000 mg | Freq: Once | INTRAMUSCULAR | Status: AC
  Administered 2015-11-22: 40 mg via INTRAVENOUS
  Filled 2015-11-22: qty 4

## 2015-11-22 MED ORDER — ALBUTEROL SULFATE (2.5 MG/3ML) 0.083% IN NEBU
2.5000 mg | INHALATION_SOLUTION | Freq: Once | RESPIRATORY_TRACT | Status: AC
  Administered 2015-11-22: 2.5 mg via RESPIRATORY_TRACT
  Filled 2015-11-22: qty 3

## 2015-11-22 MED ORDER — IPRATROPIUM BROMIDE 0.02 % IN SOLN
0.5000 mg | Freq: Once | RESPIRATORY_TRACT | Status: DC
Start: 2015-11-22 — End: 2015-11-22

## 2015-11-22 NOTE — ED Provider Notes (Signed)
CSN: [id number]     Arrival date & time 11/22/15  2200 History  By signing my name below, I, Altamease Oiler, attest that this documentation has been prepared under the direction and in the presence of Rolland Porter, MD at 2302. Electronically Signed: Altamease Oiler, ED Scribe. 11/23/2015. 12:21 AM    Chief Complaint  Patient presents with  . Shortness of Breath   Level V caveat due to respiratory distress.   The history is provided by the spouse. The history is limited by the condition of the patient. No language interpreter was used.   Brought in by EMS from the [facility], Laura Melendez is a 74 y.o. female with PMHx of HTN, DM, stroke, and parotid cancer s/p parotidectomy who presents to the Emergency Department with her husband complaining of SOB tonight. Her husband states that he received a call around 10 PM tonight that the pt was SOB at her facility and being transported to the ED. He states that her breathing seems to have improved and  that earlier in the evening, around 8:30 PM, he heard her wheezing. Pt denies chest pain. Her husband states that she is not always on oxygen. Patient's advanced directives, MOST, stipulate full care with no intubation, CPR, or cardioversion. Patient is unable to talk due to her respiratory distress.  PCP Dr Dellia Nims  Past Medical History  Diagnosis Date  . Essential hypertension, benign   . Type 2 diabetes mellitus ([place])   . Osteoporosis 06/2008  . 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 History of parotid cancer    Past Surgical History  Procedure Laterality Date  . Ankle surgery Left 2004    otif  . Abdominal hysterectomy    . Anterior and posterior repair N/A 03/11/2013    Procedure: ANTERIOR (CYSTOCELE) AND POSTERIOR REPAIR (RECTOCELE);  Surgeon: Linda Hedges, DO;  Location: [facility];  Service: Gynecology;  Laterality: N/A;  with sacrospinous ligament fixation bilateral  . Colonoscopy  2009    negative  . Cataract extraction w/phaco  Right 12/27/2014    Procedure: CATARACT EXTRACTION PHACO AND INTRAOCULAR LENS PLACEMENT RIGHT EYE;  Surgeon: Tonny Branch, MD;  Location: [facility];  Service: Ophthalmology;  Laterality: Right;  CDE:10.63  . Cataract extraction w/phaco Left 01/13/2015    Procedure: CATARACT EXTRACTION PHACO AND INTRAOCULAR LENS PLACEMENT LEFT EYE;  Surgeon: Tonny Branch, MD;  Location: [facility];  Service: Ophthalmology;  Laterality: Left;  CDE:18.87  . Cholecystectomy    . Tee without cardioversion N/A 04/01/2015    Procedure: TRANSESOPHAGEAL ECHOCARDIOGRAM (TEE);  Surgeon: Sanda Klein, MD;  Location: [facility] ENDOSCOPY;  Service: Cardiovascular;  Laterality: N/A;  . Peg placement    . Parotidectomy    . Peg placement N/A 11/16/2015    Procedure: PERCUTANEOUS ENDOSCOPIC GASTROSTOMY (PEG) REPLACEMENT;  Surgeon: Rogene Houston, MD;  Location: [facility];  Service: Endoscopy;  Laterality: N/A;   Family History  Problem Relation Age of Onset  . Cancer Father     Liver  . Cancer Sister     Breast  . Diabetes Brother   . Dementia Mother    Social History  Substance Use Topics  . Smoking status: Former Smoker -- 0.25 packs/day for 30 years    Types: Cigarettes    Quit date: 02/27/2015  . Smokeless tobacco: None  . Alcohol Use: No   Living in NH  OB History    No data available     Review of Systems  Unable  to perform ROS: Severe respiratory distress    Allergies  Review of patient's allergies indicates no known allergies.  Home Medications   Prior to Admission medications   Medication Sig Start Date End Date Taking? Authorizing Provider  albuterol (PROVENTIL) (2.5 MG/3ML) 0.083% nebulizer solution Take 3 mLs (2.5 mg total) by nebulization every 2 (two) hours as needed for wheezing. 11/15/15  Yes Orvan Falconer, MD  aspirin EC 81 MG EC tablet Take 1 tablet (81 mg total) by mouth daily. Patient taking differently: 81 mg by Gastric Tube route daily.  11/02/15  Yes Erline Hau, MD  FLUoxetine  (PROZAC) 10 MG capsule TAKE 1 CAPSULE BY MOUTH DAILY FOR DEPRESSION/ANXIETY Patient taking differently: TAKE 1 CAPSULE VIA PEG DAILY FOR DEPRESSION/ANXIETY 09/15/15  Yes [name], MD  furosemide (LASIX) 20 MG tablet 20 mg by Gastric Tube route daily.    Yes Historical Provider, MD  levETIRAcetam (KEPPRA) 750 MG tablet TAKE 1 TABLET BY MOUTH EVERY MORNING AND 2 TABLETS BY MOUTH EVERY NIGHT AT BEDTIME Patient taking differently: TAKE 1 TABLET VIA PEG TUBE IN THE MORNING & 2 TABLETS VIA PEG TUBE EVERY NIGHT AT BEDTIME 08/01/15  Yes [name], MD  metFORMIN (GLUCOPHAGE) 500 MG tablet Take one half tablet BID Patient taking differently: 250 mg by Gastric Tube route 2 (two) times daily with a meal.  04/25/15  Yes [name], MD  metoprolol tartrate (LOPRESSOR) 25 MG tablet 25 mg by Gastric Tube route 2 (two) times daily.    Yes Historical Provider, MD  nystatin cream (MYCOSTATIN) Apply 1 application topically 2 (two) times daily. Applied to vaginal rash until resolution   Yes Historical Provider, MD  potassium chloride 20 MEQ/15ML (10%) SOLN Take 20 mEq by mouth daily.   Yes Historical Provider, MD  pravastatin (PRAVACHOL) 40 MG tablet Place 1 tablet (40 mg total) into feeding tube every evening. Patient taking differently: 40 mg by Gastric Tube route every evening.  11/15/15  Yes Orvan Falconer, MD  warfarin (COUMADIN) 4 MG tablet 4 mg by Gastric Tube route every evening.    Yes Historical Provider, MD  Water For Irrigation, Sterile (FREE WATER) SOLN Place 200 mLs into feeding tube every 8 (eight) hours. 10/21/15  Yes [name], MD  cephALEXin (KEFLEX) 500 MG capsule Place 1 capsule (500 mg total) into feeding tube 3 (three) times daily. 11/23/15   Rolland Porter, MD  clotrimazole-betamethasone (LOTRISONE) cream Apply 1 application topically daily as needed (APPLY A THIN LAYER AS BARRIER AFTER CLEANING AND DRYING BUTTOCKS AREA COMPLETELY).    Historical Provider, MD  Nutritional Supplements (FEEDING  SUPPLEMENT, VITAL AF 1.2 CAL,) LIQD Place 1,000 mLs into feeding tube daily. 10/21/15   [name], MD   BP 159/79 mmHg  Pulse 96  Temp(Src) 98.2 F (36.8 C) (Rectal)  Resp 26  Ht 5' (1.524 m)  Wt 85 lb (38.556 kg)  BMI 16.60 kg/m2  SpO2 100%  Vital signs normal    Physical Exam  Constitutional: She is oriented to person, place, and time.  Non-toxic appearance. She does not appear ill. No distress.  Thin, frail female  HENT:  Head: Normocephalic and atraumatic.  Right Ear: External ear normal.  Left Ear: External ear normal.  Nose: Nose normal. No mucosal edema or rhinorrhea.  Mouth/Throat: Oropharynx is clear and moist and mucous membranes are normal. No dental abscesses or uvula swelling.  Eyes: Conjunctivae and EOM are normal. Pupils are equal, round, and reactive to light.  Neck:  Normal range of motion and full passive range of motion without pain. Neck supple.  Cardiovascular: Normal rate, regular rhythm and normal heart sounds.  Exam reveals no gallop and no friction rub.   No murmur heard. Pulmonary/Chest: Accessory muscle usage present. She is in respiratory distress. She has wheezes. She has no rhonchi. She has no rales. She exhibits no tenderness and no crepitus.  Abdominal: Soft. Normal appearance and bowel sounds are normal. She exhibits no distension. There is no tenderness. There is no rebound and no guarding.  Musculoskeletal: Normal range of motion. She exhibits no edema or tenderness.  Moves all extremities well.   Neurological: She is alert and oriented to person, place, and time. She has normal strength. No cranial nerve deficit.  Skin: Skin is warm, dry and intact. No rash noted. No erythema. There is pallor.  Psychiatric: She has a normal mood and affect. Her behavior is normal. Her mood appears not anxious. She is noncommunicative.  Pt only barely nods her head to yes or no questions.   Nursing note and vitals reviewed.   ED Course  Procedures  (including critical care time)  Medications  furosemide (LASIX) injection 40 mg (40 mg Intravenous Given 11/22/15 2311)  ipratropium-albuterol (DUONEB) 0.5-2.5 (3) MG/3ML nebulizer solution 3 mL (3 mLs Nebulization Given 11/22/15 2340)  albuterol (PROVENTIL) (2.5 MG/3ML) 0.083% nebulizer solution 2.5 mg (2.5 mg Nebulization Given 11/22/15 2340)  cefTRIAXone (ROCEPHIN) 1 g in dextrose 5 % 50 mL IVPB (0 g Intravenous Stopped 11/23/15 0115)     DIAGNOSTIC STUDIES: Oxygen Saturation is 100% on 3L, adequate by my interpretation.    COORDINATION OF CARE: 11:08 PM Discussed treatment plan which includes lab work, CXR, EKG, and a breathing treatment with the patient's husband at bedside and he agreed to the plan.  12:10 AM I re-evaluated the patient and updated family at the bedside on the results of her CXR and lab work. They are in agreement with the plan to treat her urine with abx. She has had 900 cc of urine output after the lasix. Her most recent urine culture was reviewed and she was given IV Rocephin for her UTI.  Recheck at 1:30 AM patient has almost finished her antibiotics. She sleeping comfortably in no distress. I have talked to her husband about she would be returning back to her facility. She was discharged with Keflex I can be given through her PEG tube for her UTI.        3wk ago    Specimen Description URINE, CATHETERIZED   Special Requests NONE   Culture >=100,000 COLONIES/mL ESCHERICHIA COLI  Performed at [facility]       Report Status 11/02/2015 FINAL   Organism ID, Bacteria ESCHERICHIA COLI   Resulting Agency [facility]    Culture & Susceptibility      ESCHERICHIA COLI     Antibiotic Sensitivity Microscan Status    AMPICILLIN Sensitive <=2 SENSITIVE Final    Method: MIC    AMPICILLIN/SULBACTAM Sensitive <=2 SENSITIVE Final    Method: MIC    CEFAZOLIN Sensitive <=4 SENSITIVE Final    Method: MIC    CEFTRIAXONE Sensitive <=1  SENSITIVE Final    Method: MIC    CIPROFLOXACIN Sensitive <=0.25 SENSITIVE Final    Method: MIC    GENTAMICIN Sensitive <=1 SENSITIVE Final    Method: MIC    IMIPENEM Sensitive <=0.25 SENSITIVE Final    Method: MIC    NITROFURANTOIN Sensitive <=16 SENSITIVE Final    Method:  MIC    PIP/TAZO Sensitive <=4 SENSITIVE Final    Method: MIC    TRIMETH/SULFA Sensitive <=20 SENSITIVE Final    Method: MIC    Comments ESCHERICHIA COLI (MIC)    >=100,000 COLONIES/mL ESCHERICHIA COLI               Specimen Collected: 10/31/15 10:33 AM Last Resulted: 11/02/15 10:32 AM          Labs Review Results for orders placed or performed during the hospital encounter of 11/22/15  Urinalysis, Routine w reflex microscopic (not at [facility])  Result Value Ref Range   Color, Urine YELLOW YELLOW   APPearance HAZY (A) CLEAR   Specific Gravity, Urine 1.025 1.005 - 1.030   pH 5.5 5.0 - 8.0   Glucose, UA NEGATIVE NEGATIVE mg/dL   Hgb urine dipstick LARGE (A) NEGATIVE   Bilirubin Urine NEGATIVE NEGATIVE   Ketones, ur NEGATIVE NEGATIVE mg/dL   Protein, ur >300 (A) NEGATIVE mg/dL   Nitrite POSITIVE (A) NEGATIVE   Leukocytes, UA MODERATE (A) NEGATIVE  CBC with Differential/Platelet  Result Value Ref Range   WBC 14.6 (H) 4.0 - 10.5 K/uL   RBC 3.69 (L) 3.87 - 5.11 MIL/uL   Hemoglobin 9.1 (L) 12.0 - 15.0 g/dL   HCT 29.8 (L) 36.0 - 46.0 %   MCV 80.8 78.0 - 100.0 fL   MCH 24.7 (L) 26.0 - 34.0 pg   MCHC 30.5 30.0 - 36.0 g/dL   RDW 16.8 (H) 11.5 - 15.5 %   Platelets 433 (H) 150 - 400 K/uL   Neutrophils Relative % 88 %   Neutro Abs 12.9 (H) 1.7 - 7.7 K/uL   Lymphocytes Relative 7 %   Lymphs Abs 1.0 0.7 - 4.0 K/uL   Monocytes Relative 4 %   Monocytes Absolute 0.6 0.1 - 1.0 K/uL   Eosinophils Relative 0 %   Eosinophils Absolute 0.1 0.0 - 0.7 K/uL   Basophils Relative 1 %   Basophils Absolute 0.1 0.0 - 0.1 K/uL  Comprehensive metabolic panel  Result Value Ref Range    Sodium 139 135 - 145 mmol/L   Potassium 4.4 3.5 - 5.1 mmol/L   Chloride 100 (L) 101 - 111 mmol/L   CO2 25 22 - 32 mmol/L   Glucose, Bld 205 (H) 65 - 99 mg/dL   BUN 46 (H) 6 - 20 mg/dL   Creatinine, Ser 0.86 0.44 - 1.00 mg/dL   Calcium 9.4 8.9 - 10.3 mg/dL   Total Protein 6.7 6.5 - 8.1 g/dL   Albumin 2.9 (L) 3.5 - 5.0 g/dL   AST 23 15 - 41 U/L   ALT 17 14 - 54 U/L   Alkaline Phosphatase 41 38 - 126 U/L   Total Bilirubin 0.3 0.3 - 1.2 mg/dL   GFR calc non Af Amer >60 >60 mL/min   GFR calc Af Amer >60 >60 mL/min   Anion gap 14 5 - 15  Brain natriuretic peptide  Result Value Ref Range   B Natriuretic Peptide 2931.0 (H) 0.0 - 100.0 pg/mL  Urine microscopic-add on  Result Value Ref Range   Squamous Epithelial / LPF 0-5 (A) NONE SEEN   WBC, UA TOO NUMEROUS TO COUNT 0 - 5 WBC/hpf   RBC / HPF 6-30 0 - 5 RBC/hpf   Bacteria, UA MANY (A) NONE SEEN   Casts GRANULAR CAST (A) NEGATIVE   Urine-Other YEAST PRESENT   I-stat troponin, ED  Result Value Ref Range   Troponin i, poc 0.06 0.00 -  0.08 ng/mL   Comment 3           Laboratory interpretation all normal except possible UTI, stable anemia, elevated BNP but improved from prior studies, leukocytosis     Imaging Review Dg Chest Portable 1 View  11/22/2015  CLINICAL DATA:  Shortness of breath.  Wheezing.  Chest congestion. EXAM: PORTABLE CHEST 1 VIEW COMPARISON:  11/17/2015 FINDINGS: Moderate cardiomegaly is stable. Diffuse interstitial infiltrates are again seen, suspicious for interstitial edema. Small bilateral pleural effusions also stable. IMPRESSION: Stable appearance of mild congestive heart failure and small bilateral pleural effusions. Electronically Signed   By: Earle Gell M.D.   On: 11/22/2015 23:10   I have personally reviewed and evaluated these images and lab results as part of my medical decision-making.    ED ECG REPORT   Date: 11/23/2015  Rate: 102  Rhythm: sinus tachycardia  QRS Axis: normal  Intervals: QT  prolonged  ST/T Wave abnormalities: TWI in Lateral leads  Conduction Disutrbances:none  Narrative Interpretation: electrode noise  Old EKG Reviewed: unchanged from 17 Nov 2015  I have personally reviewed the EKG tracing and agree with the computerized printout as noted.     MDM   patient has congestive heart failure presents tonight with acute exacerbation that rapidly improved with IV Lasix. She also is noted to have a urinary tract infection that was treated.    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    Plan discharge  .ik    I personally performed the services described in this documentation, which was scribed in my presence. The recorded information has been reviewed and considered.  Rolland Porter, MD, Barbette Or, MD 11/23/15 [phone]

## 2015-11-22 NOTE — ED Notes (Signed)
O2 at 3l/min - Resps 30/min

## 2015-11-22 NOTE — ED Notes (Signed)
Pt from [facility].  Per report, pt has been SOB for aprox 45 min.  Nebulizer treatment given prior to EMS arrival. Pt placed on 4 LPM O2, prior to arrival at department.  Saturations 100% during triage.

## 2015-11-23 ENCOUNTER — Non-Acute Institutional Stay (SKILLED_NURSING_FACILITY): Payer: Medicare HMO | Admitting: Internal Medicine

## 2015-11-23 ENCOUNTER — Encounter (HOSPITAL_COMMUNITY)
Admission: RE | Admit: 2015-11-23 | Discharge: 2015-11-23 | Disposition: A | Discharge status: Home / Self Care | Payer: Medicare HMO | Source: Skilled Nursing Facility | Attending: Internal Medicine | Admitting: Internal Medicine

## 2015-11-23 ENCOUNTER — Other Ambulatory Visit (HOSPITAL_COMMUNITY)
Admission: RE | Admit: 2015-11-23 | Discharge: 2015-11-23 | Disposition: A | Discharge status: Home / Self Care | Payer: Medicare HMO | Source: Skilled Nursing Facility | Attending: Internal Medicine | Admitting: Internal Medicine

## 2015-11-23 DIAGNOSIS — I5020 Unspecified systolic (congestive) heart failure: Secondary | ICD-10-CM

## 2015-11-23 DIAGNOSIS — R1314 Dysphagia, pharyngoesophageal phase: Secondary | ICD-10-CM

## 2015-11-23 LAB — PROTIME-INR
INR: 3.75 — AB (ref 0.00–1.49)
INR: 3.96 — AB (ref 0.00–1.49)
PROTHROMBIN TIME: 37.7 s — AB (ref 11.6–15.2)
Prothrombin Time: 36.2 seconds — ABNORMAL HIGH (ref 11.6–15.2)

## 2015-11-23 LAB — BASIC METABOLIC PANEL
Anion gap: 10 (ref 5–15)
Anion gap: 15 (ref 5–15)
BUN: 49 mg/dL — AB (ref 6–20)
BUN: 48 mg/dL — AB (ref 6–20)
CALCIUM: 9.1 mg/dL (ref 8.9–10.3)
CALCIUM: 9.2 mg/dL (ref 8.9–10.3)
CHLORIDE: 104 mmol/L (ref 101–111)
CHLORIDE: 104 mmol/L (ref 101–111)
CO2: 28 mmol/L (ref 22–32)
CO2: 24 mmol/L (ref 22–32)
CREATININE: 0.94 mg/dL (ref 0.44–1.00)
CREATININE: 0.98 mg/dL (ref 0.44–1.00)
GFR calc Af Amer: >60 mL/min (ref >60)
GFR calc non Af Amer: 58 mL/min — ABNORMAL LOW (ref >60)
GFR, EST NON AFRICAN AMERICAN: 55 mL/min — AB (ref >60)
GLUCOSE: 151 mg/dL — AB (ref 65–99)
Glucose, Bld: 147 mg/dL — ABNORMAL HIGH (ref 65–99)
Potassium: 4 mmol/L (ref 3.5–5.1)
Potassium: 4.1 mmol/L (ref 3.5–5.1)
SODIUM: 143 mmol/L (ref 135–145)
Sodium: 142 mmol/L (ref 135–145)

## 2015-11-23 MED ORDER — CEPHALEXIN 500 MG PO CAPS: 500.0000 mg | ORAL_CAPSULE | Freq: Three times a day (TID) | ORAL | Status: DC

## 2015-11-23 NOTE — Discharge Instructions (Signed)
Increase her lasix to double the lasix each dose for the next 2 days then back to the usual dose. She has a urinary tract infection. The urine culture needs to be rechecked in about 2 days. Give her the keflex for the infection.

## 2015-11-23 NOTE — Progress Notes (Addendum)
Patient ID: Laura Melendez, female   DOB: 1941-07-31, 74 y.o.   MRN: [id number]                PROGRESS NOTE  DATE:  11/21/2015       FACILITY: [facility]                     LEVEL OF CARE:   SNF   Acute Visit             CHIEF COMPLAINT:  Follow up CHF.     HISTORY OF PRESENT ILLNESS:  This is a patient who recently was over in the emergency room on 11/17/2015.  Her chest x-ray suggested mild pulmonary edema with a BNP of over 4500.    I saw her on 11/19/2015 in follow-up.  I started her on Lasix 40 mg a day after giving her a 40 mg dose additionally that day.    PAST MEDICAL HISTORY/PROBLEM LIST: Past Medical History  Diagnosis Date  . Essential hypertension, benign   . Type 2 diabetes mellitus ([place])   . Osteoporosis 06/2008  . 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 History of parotid cancer      LABORATORY DATA:  Her lab work has come back today showing:    Sodium of 143, potassium 4.4, chloride 110, CO2 of 26, BUN 43, creatinine 0.84.    White count 10.4, hemoglobin 9 which is reasonably stable for her.  Differential count normal.    REVIEW OF SYSTEMS:   Difficult from this patient.      PHYSICAL EXAMINATION:   GENERAL APPEARANCE:  "Hello, Dr. Dellia Nims."   The patient is sitting in the TV room, stopped me when I was passing by.   CHEST/RESPIRATORY:  She still has a few basilar crackles.       CARDIOVASCULAR:   CARDIAC:  Heart sounds are normal.  JVP is not elevated.  There is no S3.      GASTROINTESTINAL:   ABDOMEN:  No masses.     LIVER/SPLEEN/KIDNEYS:  No liver, no spleen.   GENITOURINARY:   BLADDER:  No suprapubic or costovertebral angle tenderness.  Foley catheter is still in place.   CIRCULATION:   EDEMA/VARICOSITIES:  Extremities:  There is no evidence of a DVT.    ASSESSMENT/PLAN:                 Congestive heart failure.   Her echocardiogram done in the hospital showed mild LVH, reasonably normal ejection fraction of 50-55%.  I am going  to continue the Lasix 40 mg.  Follow up her lab work next week.    I took the chance today to ask her about depression, which I had felt was possibly moderating a lot of her decline.  I could not really get her to speak about it.  She certainly does look depressed and weak, although the weakness itself may have been mostly due to recurrent  profoundly complicated hospitalizations.   Dysphagia: She is now working with speech therapy again. This problem has fluctuated in severity although has certainly  Not come back close to a point where we feel comfortable with an oral diet

## 2015-11-25 ENCOUNTER — Emergency Department (HOSPITAL_COMMUNITY): Payer: Medicare HMO

## 2015-11-25 ENCOUNTER — Inpatient Hospital Stay (HOSPITAL_COMMUNITY)
Admission: EM | Admit: 2015-11-25 | Discharge: 2015-11-27 | DRG: 292 | Disposition: A | Discharge status: Hospice | Payer: Medicare HMO | Attending: Internal Medicine | Admitting: Internal Medicine

## 2015-11-25 ENCOUNTER — Encounter (HOSPITAL_COMMUNITY): Payer: Self-pay

## 2015-11-25 DIAGNOSIS — R0602 Shortness of breath: Secondary | ICD-10-CM | POA: Diagnosis present

## 2015-11-25 DIAGNOSIS — G40909 Epilepsy, unspecified, not intractable, without status epilepticus: Secondary | ICD-10-CM | POA: Diagnosis present

## 2015-11-25 DIAGNOSIS — B961 Klebsiella pneumoniae [K. pneumoniae] as the cause of diseases classified elsewhere: Secondary | ICD-10-CM | POA: Diagnosis present

## 2015-11-25 DIAGNOSIS — I110 Hypertensive heart disease with heart failure: Secondary | ICD-10-CM | POA: Diagnosis present

## 2015-11-25 DIAGNOSIS — Z7982 Long term (current) use of aspirin: Secondary | ICD-10-CM

## 2015-11-25 DIAGNOSIS — E440 Moderate protein-calorie malnutrition: Secondary | ICD-10-CM | POA: Diagnosis present

## 2015-11-25 DIAGNOSIS — E119 Type 2 diabetes mellitus without complications: Secondary | ICD-10-CM | POA: Diagnosis present

## 2015-11-25 DIAGNOSIS — E0811 Diabetes mellitus due to underlying condition with ketoacidosis with coma: Secondary | ICD-10-CM | POA: Diagnosis not present

## 2015-11-25 DIAGNOSIS — M810 Age-related osteoporosis without current pathological fracture: Secondary | ICD-10-CM | POA: Diagnosis present

## 2015-11-25 DIAGNOSIS — I69351 Hemiplegia and hemiparesis following cerebral infarction affecting right dominant side: Secondary | ICD-10-CM

## 2015-11-25 DIAGNOSIS — Z85818 Personal history of malignant neoplasm of other sites of lip, oral cavity, and pharynx: Secondary | ICD-10-CM | POA: Diagnosis not present

## 2015-11-25 DIAGNOSIS — Z7189 Other specified counseling: Secondary | ICD-10-CM

## 2015-11-25 DIAGNOSIS — Z931 Gastrostomy status: Secondary | ICD-10-CM | POA: Diagnosis not present

## 2015-11-25 DIAGNOSIS — Z7901 Long term (current) use of anticoagulants: Secondary | ICD-10-CM

## 2015-11-25 DIAGNOSIS — R319 Hematuria, unspecified: Secondary | ICD-10-CM

## 2015-11-25 DIAGNOSIS — E785 Hyperlipidemia, unspecified: Secondary | ICD-10-CM | POA: Diagnosis present

## 2015-11-25 DIAGNOSIS — Z7984 Long term (current) use of oral hypoglycemic drugs: Secondary | ICD-10-CM

## 2015-11-25 DIAGNOSIS — Z515 Encounter for palliative care: Secondary | ICD-10-CM | POA: Diagnosis not present

## 2015-11-25 DIAGNOSIS — Z833 Family history of diabetes mellitus: Secondary | ICD-10-CM

## 2015-11-25 DIAGNOSIS — Z66 Do not resuscitate: Secondary | ICD-10-CM | POA: Diagnosis present

## 2015-11-25 DIAGNOSIS — R778 Other specified abnormalities of plasma proteins: Secondary | ICD-10-CM

## 2015-11-25 DIAGNOSIS — I5033 Acute on chronic diastolic (congestive) heart failure: Secondary | ICD-10-CM | POA: Diagnosis not present

## 2015-11-25 DIAGNOSIS — R1310 Dysphagia, unspecified: Secondary | ICD-10-CM | POA: Diagnosis present

## 2015-11-25 DIAGNOSIS — Z681 Body mass index (BMI) 19 or less, adult: Secondary | ICD-10-CM | POA: Diagnosis not present

## 2015-11-25 DIAGNOSIS — I10 Essential (primary) hypertension: Secondary | ICD-10-CM

## 2015-11-25 DIAGNOSIS — R7989 Other specified abnormal findings of blood chemistry: Secondary | ICD-10-CM

## 2015-11-25 DIAGNOSIS — I4891 Unspecified atrial fibrillation: Secondary | ICD-10-CM | POA: Diagnosis present

## 2015-11-25 DIAGNOSIS — I5023 Acute on chronic systolic (congestive) heart failure: Secondary | ICD-10-CM

## 2015-11-25 DIAGNOSIS — Z87891 Personal history of nicotine dependence: Secondary | ICD-10-CM | POA: Diagnosis not present

## 2015-11-25 DIAGNOSIS — G8191 Hemiplegia, unspecified affecting right dominant side: Secondary | ICD-10-CM

## 2015-11-25 DIAGNOSIS — N390 Urinary tract infection, site not specified: Secondary | ICD-10-CM | POA: Diagnosis present

## 2015-11-25 DIAGNOSIS — Z79899 Other long term (current) drug therapy: Secondary | ICD-10-CM | POA: Diagnosis not present

## 2015-11-25 LAB — CBC WITH DIFFERENTIAL/PLATELET
BASOS PCT: 0 %
Basophils Absolute: 0.1 10*3/uL (ref 0.0–0.1)
EOS ABS: 0 10*3/uL (ref 0.0–0.7)
EOS PCT: 0 %
HCT: 31.3 % — ABNORMAL LOW (ref 36.0–46.0)
Hemoglobin: 9.4 g/dL — ABNORMAL LOW (ref 12.0–15.0)
Lymphocytes Relative: 8 %
Lymphs Abs: 0.9 10*3/uL (ref 0.7–4.0)
MCH: 24.5 pg — ABNORMAL LOW (ref 26.0–34.0)
MCHC: 30 g/dL (ref 30.0–36.0)
MCV: 81.7 fL (ref 78.0–100.0)
MONO ABS: 0.4 10*3/uL (ref 0.1–1.0)
MONOS PCT: 4 %
Neutro Abs: 9.8 10*3/uL — ABNORMAL HIGH (ref 1.7–7.7)
Neutrophils Relative %: 88 %
Platelets: 411 10*3/uL — ABNORMAL HIGH (ref 150–400)
RBC: 3.83 MIL/uL — ABNORMAL LOW (ref 3.87–5.11)
RDW: 16.6 % — AB (ref 11.5–15.5)
WBC: 11.2 10*3/uL — ABNORMAL HIGH (ref 4.0–10.5)

## 2015-11-25 LAB — GLUCOSE, CAPILLARY
GLUCOSE-CAPILLARY: 101 mg/dL — AB (ref 65–99)
Glucose-Capillary: 101 mg/dL — ABNORMAL HIGH (ref 65–99)
Glucose-Capillary: 114 mg/dL — ABNORMAL HIGH (ref 65–99)
Glucose-Capillary: 132 mg/dL — ABNORMAL HIGH (ref 65–99)

## 2015-11-25 LAB — BASIC METABOLIC PANEL
Anion gap: 10 (ref 5–15)
BUN: 51 mg/dL — ABNORMAL HIGH (ref 6–20)
CALCIUM: 9.6 mg/dL (ref 8.9–10.3)
CO2: 28 mmol/L (ref 22–32)
CREATININE: 0.93 mg/dL (ref 0.44–1.00)
Chloride: 105 mmol/L (ref 101–111)
GFR calc non Af Amer: 59 mL/min — ABNORMAL LOW (ref >60)
Glucose, Bld: 226 mg/dL — ABNORMAL HIGH (ref 65–99)
Potassium: 4.4 mmol/L (ref 3.5–5.1)
SODIUM: 143 mmol/L (ref 135–145)

## 2015-11-25 LAB — URINALYSIS, ROUTINE W REFLEX MICROSCOPIC
Bilirubin Urine: NEGATIVE
GLUCOSE, UA: NEGATIVE mg/dL
Ketones, ur: NEGATIVE mg/dL
Nitrite: NEGATIVE
Specific Gravity, Urine: >1.03 — ABNORMAL HIGH (ref 1.005–1.030)
pH: 5.5 (ref 5.0–8.0)

## 2015-11-25 LAB — URINE CULTURE: Culture: >=100000

## 2015-11-25 LAB — URINE MICROSCOPIC-ADD ON

## 2015-11-25 LAB — TROPONIN I
TROPONIN I: 0.05 ng/mL — AB (ref <0.031)
Troponin I: 0.06 ng/mL — ABNORMAL HIGH (ref <0.031)
Troponin I: 0.06 ng/mL — ABNORMAL HIGH (ref <0.031)
Troponin I: 0.05 ng/mL — ABNORMAL HIGH (ref <0.031)

## 2015-11-25 LAB — BRAIN NATRIURETIC PEPTIDE: B NATRIURETIC PEPTIDE 5: 3507 pg/mL — AB (ref 0.0–100.0)

## 2015-11-25 LAB — PROTIME-INR
INR: 2.59 — ABNORMAL HIGH (ref 0.00–1.49)
Prothrombin Time: 27.4 seconds — ABNORMAL HIGH (ref 11.6–15.2)

## 2015-11-25 MED ORDER — WARFARIN SODIUM 2 MG PO TABS
4.0000 mg | ORAL_TABLET | Freq: Once | ORAL | Status: AC
Start: 2015-11-25 — End: 2015-11-25
  Administered 2015-11-25: 4 mg via ORAL
  Filled 2015-11-25: qty 2

## 2015-11-25 MED ORDER — METFORMIN HCL 500 MG PO TABS
250.0000 mg | ORAL_TABLET | Freq: Two times a day (BID) | ORAL | Status: DC
  Administered 2015-11-25 – 2015-11-27 (×5): 250 mg via ORAL
  Filled 2015-11-25 (×5): qty 1

## 2015-11-25 MED ORDER — SODIUM CHLORIDE 0.9 % IJ SOLN
3.0000 mL | Freq: Two times a day (BID) | INTRAMUSCULAR | Status: DC
  Administered 2015-11-25 – 2015-11-27 (×4): 3 mL via INTRAVENOUS

## 2015-11-25 MED ORDER — PRAVASTATIN SODIUM 40 MG PO TABS
40.0000 mg | ORAL_TABLET | Freq: Every day | ORAL | Status: DC
Start: 2015-11-25 — End: 2015-11-27
  Administered 2015-11-25 – 2015-11-27 (×3): 40 mg via ORAL
  Filled 2015-11-25 (×3): qty 1

## 2015-11-25 MED ORDER — DEXTROSE 5 % IV SOLN
1.0000 g | Freq: Once | INTRAVENOUS | Status: AC
  Administered 2015-11-25: 1 g via INTRAVENOUS
  Filled 2015-11-25: qty 10

## 2015-11-25 MED ORDER — ASPIRIN EC 81 MG PO TBEC
81.0000 mg | DELAYED_RELEASE_TABLET | Freq: Every day | ORAL | Status: DC
  Administered 2015-11-25 – 2015-11-27 (×3): 81 mg via ORAL
  Filled 2015-11-25 (×3): qty 1

## 2015-11-25 MED ORDER — ASPIRIN 81 MG PO CHEW
324.0000 mg | CHEWABLE_TABLET | Freq: Once | ORAL | Status: AC
  Administered 2015-11-25: 324 mg via JEJUNOSTOMY
  Filled 2015-11-25: qty 4

## 2015-11-25 MED ORDER — NYSTATIN 100000 UNIT/GM EX CREA
1.0000 "application " | TOPICAL_CREAM | Freq: Two times a day (BID) | CUTANEOUS | Status: DC
  Administered 2015-11-25 – 2015-11-27 (×5): 1 via TOPICAL
  Filled 2015-11-25: qty 15

## 2015-11-25 MED ORDER — DEXTROSE 5 % IV SOLN: 1.0000 g | INTRAVENOUS | Status: DC

## 2015-11-25 MED ORDER — LEVETIRACETAM 500 MG PO TABS
500.0000 mg | ORAL_TABLET | Freq: Two times a day (BID) | ORAL | Status: DC
  Administered 2015-11-25 – 2015-11-27 (×5): 500 mg via ORAL
  Filled 2015-11-25 (×5): qty 1

## 2015-11-25 MED ORDER — FLUOXETINE HCL 10 MG PO CAPS
10.0000 mg | ORAL_CAPSULE | Freq: Every day | ORAL | Status: DC
  Administered 2015-11-25 – 2015-11-27 (×3): 10 mg
  Filled 2015-11-25 (×4): qty 1

## 2015-11-25 MED ORDER — METOPROLOL TARTRATE 25 MG PO TABS
25.0000 mg | ORAL_TABLET | Freq: Two times a day (BID) | ORAL | Status: DC
  Administered 2015-11-25 – 2015-11-27 (×5): 25 mg
  Filled 2015-11-25 (×5): qty 1

## 2015-11-25 MED ORDER — VITAL AF 1.2 CAL PO LIQD
1000.0000 mL | ORAL | Status: DC
  Administered 2015-11-25 – 2015-11-27 (×3): 1000 mL
  Filled 2015-11-25 (×5): qty 1000

## 2015-11-25 MED ORDER — FUROSEMIDE 10 MG/ML IJ SOLN
40.0000 mg | Freq: Once | INTRAMUSCULAR | Status: AC
  Administered 2015-11-25: 40 mg via INTRAVENOUS
  Filled 2015-11-25: qty 4

## 2015-11-25 MED ORDER — POTASSIUM CHLORIDE 20 MEQ/15ML (10%) PO SOLN
20.0000 meq | Freq: Every day | ORAL | Status: DC
  Administered 2015-11-25 – 2015-11-27 (×3): 20 meq via ORAL
  Filled 2015-11-25 (×3): qty 30

## 2015-11-25 MED ORDER — WARFARIN - PHARMACIST DOSING INPATIENT
Freq: Every day | Status: DC
  Administered 2015-11-25 – 2015-11-26 (×2)

## 2015-11-25 MED ORDER — DEXTROSE 5 % IV SOLN
1.0000 g | INTRAVENOUS | Status: DC
  Administered 2015-11-26 – 2015-11-27 (×2): 1 g via INTRAVENOUS
  Filled 2015-11-25 (×3): qty 10

## 2015-11-25 MED ORDER — BUMETANIDE 0.25 MG/ML IJ SOLN
1.0000 mg | Freq: Two times a day (BID) | INTRAMUSCULAR | Status: DC
  Administered 2015-11-25 – 2015-11-27 (×5): 1 mg via INTRAVENOUS
  Filled 2015-11-25 (×5): qty 4

## 2015-11-25 MED ORDER — FREE WATER
200.0000 mL | Freq: Three times a day (TID) | Status: DC
  Administered 2015-11-25 – 2015-11-27 (×6): 200 mL

## 2015-11-25 MED ORDER — IPRATROPIUM-ALBUTEROL 0.5-2.5 (3) MG/3ML IN SOLN
3.0000 mL | RESPIRATORY_TRACT | Status: AC
  Administered 2015-11-25 (×2): 3 mL via RESPIRATORY_TRACT
  Filled 2015-11-25: qty 6

## 2015-11-25 MED ORDER — ALBUTEROL SULFATE (2.5 MG/3ML) 0.083% IN NEBU: 2.5000 mg | INHALATION_SOLUTION | RESPIRATORY_TRACT | Status: DC | PRN

## 2015-11-25 MED ORDER — INSULIN ASPART 100 UNIT/ML ~~LOC~~ SOLN
0.0000 [IU] | SUBCUTANEOUS | Status: DC
  Administered 2015-11-25 – 2015-11-26 (×3): 1 [IU] via SUBCUTANEOUS
  Administered 2015-11-26: 2 [IU] via SUBCUTANEOUS
  Administered 2015-11-26 (×2): 1 [IU] via SUBCUTANEOUS
  Administered 2015-11-27 (×2): 2 [IU] via SUBCUTANEOUS
  Administered 2015-11-27: 1 [IU] via SUBCUTANEOUS

## 2015-11-25 NOTE — Progress Notes (Signed)
Patient Demographics  Laura Melendez, is a 74 y.o. female, DOB - October 18, 1941, [id number]  Admit date - 11/25/2015   Admitting Physician Orvan Falconer, MD  Outpatient Primary MD for the patient is [name], MD  LOS -    Chief Complaint  Patient presents with  . Shortness of Breath      Admission history of present illness/brief narrative: Laura Melendez is an 74 y.o. 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   Subjective:   [name] today appears to be lethargic, can't provide any complaints.  Assessment & Plan    Principal Problem:   Acute on chronic diastolic CHF (congestive heart failure) (HCC) Active Problems:   Essential hypertension, benign   DM (diabetes mellitus) (HCC)   Right hemiparesis (HCC)   Malnutrition of moderate degree   Acute on chronic diastolic CHF (congestive heart failure), NYHA class 1 (HCC)  Acute on chronic diastolic CHF - Patient with elevated BNP at 3500, chest x-ray showing worsening CHF. - Continue with IV diuresis, monitor daily weights, strict ins and outs.  Elevated troponins - This is most likely related to CHF - Continue to follow trend 0.05>0.06   Atrial fibrillation - Currently normal sinus rhythm, on warfarin for anticoagulation, dosed by pharmacy  UTI - Continue with IV  Rocephin, follow on urine cultures  History of CVA - Patient is on warfarin  Hyperlipidemia - Continue with statin  Diabetes mellitus - Continue with metformin, on insulin sliding scale  History of seizures - Continue with Keppra  Dysphagia - Status post PEG.   Code Status: Partial  Family Communication: None at bedside  Disposition Plan: SNF when diuresed   Procedures  None   Consults   Palliative   Medications  Scheduled Meds: . aspirin EC  81 mg Oral Daily  . bumetanide (BUMEX) IV  1 mg Intravenous Q12H  . [START ON 11/26/2015] cefTRIAXone (ROCEPHIN)  IV  1 g Intravenous Q24H  . feeding supplement (VITAL AF 1.2 CAL)  1,000 mL Per Tube Q24H  . FLUoxetine  10 mg Per Tube Daily  . free water  200 mL Per Tube 3 times per day  . insulin aspart  0-9 Units Subcutaneous 6 times per day  . levETIRAcetam  500 mg Oral BID  . metFORMIN  250 mg Oral BID WC  . metoprolol tartrate  25 mg Per Tube BID  . nystatin cream  1 application Topical BID  . potassium chloride  20 mEq Oral Daily  . pravastatin  40 mg Oral Daily  . sodium chloride  3 mL Intravenous Q12H  . warfarin  4 mg Oral Once  . Warfarin - Pharmacist Dosing Inpatient   Does not apply q1800   Continuous Infusions:  PRN Meds:.albuterol  DVT Prophylaxis  On warfarin  Lab Results  Component Value Date   PLT 411* 11/25/2015    Antibiotics    Anti-infectives    Start     Dose/Rate Route Frequency Ordered Stop   11/26/15 0500  cefTRIAXone (ROCEPHIN) 1 g in dextrose 5 % 50 mL IVPB     1 g 100 mL/hr over 30 Minutes Intravenous Every 24 hours 11/25/15 0617 12/03/15 0459   11/25/15 0515  cefTRIAXone (ROCEPHIN) 1 g in dextrose 5 % 50 mL IVPB  Status:  Discontinued     1 g 100 mL/hr over 30 Minutes Intravenous Every 24 hours 11/25/15 0508 11/25/15 0616   11/25/15 0445  cefTRIAXone (ROCEPHIN) 1 g in dextrose 5 % 50 mL IVPB     1 g 100 mL/hr over 30 Minutes Intravenous  Once 11/25/15 0431 11/25/15 0524           Objective:   Filed Vitals:   11/25/15 0430 11/25/15 0449 11/25/15 0500 11/25/15 0530  BP: 119/63 119/63 130/68 123/63  Pulse: 80 92 92 95  Temp:      TempSrc:      Resp: 19 20 22 14   Weight:      SpO2: 100% 100% 100% 99%    Wt Readings from Last 3 Encounters:  11/25/15 38.556 kg (85 lb)  11/22/15 38.556 kg (85 lb)  11/17/15 38.556 kg (85 lb)    No intake or output data in the 24 hours ending 11/25/15 0917   Physical Exam  Wakes up to verbal stimuli, otherwise noncommunicative. Supple Neck, no thyromegaly  Symmetrical Chest wall movement, Good air movement bilaterally, no wheezing RRR,No Gallops,Rubs, No Parasternal Heave +ve B.Sounds, Abd Soft, No tenderness,  No Cyanosis, Clubbing or edema, No new Rash or bruise     Data Review   Micro Results Recent Results (from the past 240 hour(s))  Urine culture     Status: None (Preliminary result)   Collection Time: 11/22/15 10:15 PM  Result Value Ref Range Status   Specimen Description URINE, CATHETERIZED  Final   Special Requests NONE  Final   Culture   Final    >=100,000 COLONIES/mL GRAM NEGATIVE RODS Performed at [facility]    Report Status PENDING  Incomplete    Radiology Reports Ct Head Wo Contrast  11/13/2015  CLINICAL DATA:  Altered mental status, lethargy EXAM: CT HEAD WITHOUT CONTRAST TECHNIQUE: Contiguous axial images were obtained from the base of the skull through the vertex without intravenous contrast. COMPARISON:  10/13/2015 FINDINGS: No skull fracture is noted. No intracranial hemorrhage, mass effect or midline shift. No acute cortical infarction. No mass lesion is noted on this unenhanced scan. The gray and white-matter differentiation is preserved. Stable cerebral atrophy. Extensive periventricular and patchy subcortical chronic white matter disease again noted. IMPRESSION: No acute intracranial abnormality. Stable atrophy and extensive chronic white matter disease. Electronically  Signed   By: Lahoma Crocker M.D.   On: 11/13/2015 15:13   Dg Chest Port 1 View  11/25/2015  CLINICAL DATA:  74 year old female with wheezing and shortness of breath EXAM: PORTABLE CHEST 1 VIEW COMPARISON:  Radiograph dated 11/22/2015 FINDINGS: Single portable view of the chest demonstrates cardiomegaly with bilateral central vascular prominence compatible with congestive changes. There has been slight interval increased in the congestive appearance compared to the prior study. No significant pleural effusion or pneumothorax. The costophrenic angles are not included in the image. The osseous structures are grossly unremarkable. IMPRESSION: Slight interval worsening of the congestive  pattern. Follow-up recommended. Electronically Signed   By: Anner Crete M.D.   On: 11/25/2015 04:05   Dg Chest Portable 1 View  11/22/2015  CLINICAL DATA:  Shortness of breath.  Wheezing.  Chest congestion. EXAM: PORTABLE CHEST 1 VIEW COMPARISON:  11/17/2015 FINDINGS: Moderate cardiomegaly is stable. Diffuse interstitial infiltrates are again seen, suspicious for interstitial edema. Small bilateral pleural effusions also stable. IMPRESSION: Stable appearance of mild congestive heart failure and small bilateral pleural effusions. Electronically Signed   By: Earle Gell M.D.   On: 11/22/2015 23:10   Dg Chest Port 1 View  11/17/2015  CLINICAL DATA:  74 year old female with increasing shortness of breath. EXAM: PORTABLE CHEST 1 VIEW COMPARISON:  Chest radiograph dated 11/14/2015 FINDINGS: Single-view of the chest demonstrate cardiomegaly with increased central vascular and interstitial prominence compatible with pulmonary edema. There is no focal consolidation or pneumothorax. A small pleural effusion may be present. Osseous structures are grossly unremarkable. IMPRESSION: Cardiomegaly with findings of pulmonary edema. Electronically Signed   By: Anner Crete M.D.   On: 11/17/2015 01:13   Dg Chest Port 1 View  11/14/2015   CLINICAL DATA:  Aspiration EXAM: PORTABLE CHEST 1 VIEW COMPARISON:  11/13/2015 FINDINGS: There is unchanged cardiomegaly. Central and basilar ground-glass opacities persist, likely alveolar edema. Pleural effusions persist bilaterally. No pneumothorax. IMPRESSION: Unchanged, with persisting cardiomegaly, pleural effusions and alveolar edema Electronically Signed   By: Andreas Newport M.D.   On: 11/14/2015 06:42   Dg Chest Port 1 View  11/13/2015  CLINICAL DATA:  74 year old female with pneumonia EXAM: PORTABLE CHEST 1 VIEW COMPARISON:  Prior chest x-ray 11/09/2015 FINDINGS: Overall, no significant interval change in the appearance of the chest. Persistent cardiomegaly and mild pulmonary edema. There are moderate bilateral layering pleural effusions with associated bibasilar opacities. No pneumothorax. No support apparatus. IMPRESSION: No significant interval change in the appearance of the chest compared to 11/09/2015. Stable cardiomegaly, mild pulmonary edema and moderate bilateral layering effusions with associated bibasilar atelectasis. Superimposed infiltrate in the lung bases would be difficult to exclude radiographically. Electronically Signed   By: Jacqulynn Cadet M.D.   On: 11/13/2015 08:54   Dg Chest Port 1 View  11/09/2015  CLINICAL DATA:  Acute respiratory failure with hypoxia. Aspiration pneumonia. Congestive heart failure. EXAM: PORTABLE CHEST 1 VIEW COMPARISON:  11/08/2015 FINDINGS: Cardiomegaly stable. Pulmonary vascular congestion and symmetric lower lung airspace disease shows no significant change allowing for ladder radiograph technique, most consistent with pulmonary edema. Probable layering bilateral pleural effusions again noted. IMPRESSION: No significant change in cardiomegaly, probable diffuse pulmonary edema, and bilateral pleural effusions, consistent with congestive heart failure. Electronically Signed   By: Earle Gell M.D.   On: 11/09/2015 07:44   Dg Chest Port 1  View  11/08/2015  CLINICAL DATA:  Hypoxia EXAM: PORTABLE CHEST - 1 VIEW COMPARISON:  11/08/2015 FINDINGS: Cardiac shadow remains enlarged. Bibasilar changes are again seen with pleural effusions. Mild vascular congestion is noted. No focal confluent infiltrate is seen. No bony abnormality is noted. Gastrostomy catheter is noted over the upper abdomen. IMPRESSION: Stable appearance of the chest when compare with the prior exam consistent with mild CHF. Electronically Signed   By: Inez Catalina M.D.   On: 11/08/2015 14:41   Dg Chest Port 1 View  11/08/2015  CLINICAL DATA:  Pneumonia, cough EXAM: PORTABLE CHEST 1 VIEW COMPARISON:  11/07/2015 FINDINGS: Cardiomegaly with vascular congestion. Bilateral layering effusions and lower lobe opacities, likely edema. Findings are similar to prior study. No acute bony  abnormality. IMPRESSION: Suspect mild CHF with bilateral lower lobe opacities, likely edema and layering effusions. No real change. Electronically Signed   By: Rolm Baptise M.D.   On: 11/08/2015 10:09   Dg Chest Port 1 View  11/07/2015  CLINICAL DATA:  Aspiration pneumonia. EXAM: PORTABLE CHEST 1 VIEW COMPARISON:  11/06/2015. FINDINGS: Cardiomegaly with pulmonary vascular prominence and interstitial prominence consistent with congestive heart failure. Bibasilar pneumonia cannot be excluded . Small bilateral effusions. No pneumothorax. IMPRESSION: Findings suggest congestive heart failure with bilateral pulmonary edema and small pleural effusions. Bibasilar pneumonia cannot be excluded. No significant change from prior exam. Electronically Signed   By: [place]   On: 11/07/2015 08:31   Dg Chest Portable 1 View  11/06/2015  CLINICAL DATA:  Shortness of breath. EXAM: PORTABLE CHEST 1 VIEW COMPARISON:  10/29/2015 FINDINGS: Worsening bibasilar opacities from prior with developing haziness suggestive of pleural effusions. Vascular congestion is unchanged. Cardiomediastinal contours with mild  cardiomegaly are unchanged. No pneumothorax. The bones are under mineralized IMPRESSION: Worsening bibasilar opacities with developing lower lobe haziness suggestive of pleural effusions. Aspiration, pneumonia, or pleural effusions with compressive atelectasis. Electronically Signed   By: Jeb Levering M.D.   On: 11/06/2015 05:32   Dg Chest Portable 1 View  10/29/2015  CLINICAL DATA:  Respiratory distress EXAM: PORTABLE CHEST - 1 VIEW COMPARISON:  10/17/2015 FINDINGS: Cardiac shadow is stable. Lungs are well aerated bilaterally. Mild vascular congestion is noted as well as bibasilar increased density likely related atelectasis or early infiltrate. No bony abnormality is seen. IMPRESSION: Increasing bibasilar densities and mild vascular congestion. Electronically Signed   By: Inez Catalina M.D.   On: 10/29/2015 20:52     CBC  Recent Labs Lab 11/19/15 1055 11/21/15 0600 11/22/15 2245 11/25/15 0344  WBC 9.2 10.4 14.6* 11.2*  HGB 9.8* 9.0* 9.1* 9.4*  HCT 32.0* 29.4* 29.8* 31.3*  PLT 503* 455* 433* 411*  MCV 81.0 81.4 80.8 81.7  MCH 24.8* 24.9* 24.7* 24.5*  MCHC 30.6 30.6 30.5 30.0  RDW 16.5* 16.7* 16.8* 16.6*  LYMPHSABS 1.7 1.9 1.0 0.9  MONOABS 0.4 0.7 0.6 0.4  EOSABS 0.2 0.3 0.1 0.0  BASOSABS 0.1 0.1 0.1 0.1    Chemistries   Recent Labs Lab 11/21/15 0600 11/22/15 2245 11/23/15 0500 11/23/15 0830 11/25/15 0344  NA 143 139 142 143 143  K 4.4 4.4 4.0 4.1 4.4  CL 110 100* 104 104 105  CO2 26 25 28 24 28   GLUCOSE 142* 205* 151* 147* 226*  BUN 43* 46* 49* 48* 51*  CREATININE 0.84 0.86 0.94 0.98 0.93  CALCIUM 9.1 9.4 9.1 9.2 9.6  AST  --  23  --   --   --   ALT  --  17  --   --   --   ALKPHOS  --  41  --   --   --   BILITOT  --  0.3  --   --   --    ------------------------------------------------------------------------------------------------------------------ estimated creatinine clearance is 32.3 mL/min (by C-G formula based on Cr of  0.93). ------------------------------------------------------------------------------------------------------------------ No results for input(s): HGBA1C in the last 72 hours. ------------------------------------------------------------------------------------------------------------------ No results for input(s): CHOL, HDL, LDLCALC, TRIG, CHOLHDL, LDLDIRECT in the last 72 hours. ------------------------------------------------------------------------------------------------------------------ No results for input(s): TSH, T4TOTAL, T3FREE, THYROIDAB in the last 72 hours.  Invalid input(s): FREET3 ------------------------------------------------------------------------------------------------------------------ No results for input(s): VITAMINB12, FOLATE, FERRITIN, TIBC, IRON, RETICCTPCT in the last 72 hours.  Coagulation profile  Recent Labs Lab 11/19/15 1055 11/23/15  0500 11/23/15 0830 11/25/15 0344  INR 2.29* 3.75* 3.96* 2.59*    No results for input(s): DDIMER in the last 72 hours.  Cardiac Enzymes  Recent Labs Lab 11/25/15 0344 11/25/15 0628  TROPONINI 0.05* 0.06*   ------------------------------------------------------------------------------------------------------------------ Invalid input(s): POCBNP     Time Spent in minutes   No charge   [facility], DAWOOD M.D on 11/25/2015 at 9:17 AM  Between 7am to 7pm - Pager - [phone]  After 7pm go to [email or web address] - password [email or web address]  [facility]   Office  [phone]

## 2015-11-25 NOTE — ED Notes (Signed)
Patient from [facility] sent for evaluation of respiratory distress. Staff member states patient had audible wheezing this morning, that improved with breathing treatment. Staff also states patient was using accessory muscles to breath. Oxygen saturation upon arrival to ED is 98% on 2L/M via [place]

## 2015-11-25 NOTE — ED Provider Notes (Signed)
TIME SEEN: 3:30 AM  CHIEF COMPLAINT: Shortness of breath, wheezing, respiratory distress  HPI: Pt is a 74 y.o. female with history of hypertension, diabetes, stroke with right-sided hemiparesis, atrial fibrillation on Coumadin who has had a recent complex medical history outlined below who presents with shortness of breath, wheezing or respiratory distress that started just prior to arrival. Patient transported from [facility] rehabilitation facility.   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 Nicolette Bang. She was extubated after surgery but on October 4 developed a neck hematoma and required fiberoptic intubation. She went back to the operating room for evacuation of the hematoma and ligation of bleeding vessels. She remained intubated until October 10 and during this time developed multifocal pneumonia. Sputum culture grew Escherichia coli and Klebsiella treated with IV antibiotics. This was also complicated by a pneumothorax and required a chest tube on October 5. Chest tube was removed on October 11. Patient was found to have dysphasia and a PEG tube was placed on October 17. She was discharged to rehabilitation facility from [facility] on October 21. She has been back to the ED pain emergency department now 5 times for shortness of breath, respiratory distress. She was admitted on October 31, November 12, November 20. Was seen in the ED on December 1 and December 6 [address] [place] distress and shortness of breath and symptoms resolved after albuterol, Atrovent and IV Lasix and she was discharged back to the [place] facility.  She is now on 3L O2 [place] at the [facility]   Pt has a DNR and MOST form.  Patient's son, Shermane Rauser, at bedside.  Confirms that patient would want treatment, hospitalization if needed but does not want intubation, CPR, cardioversion, defibrillation.  His contact number is [phone].  He feels that  the patient looks much better than he expected her to be. She did receive albuterol prior to arrival to the emergency department. He states that he feels her breathing is slightly more labored than normal. Patient denies feeling short of breath. She is sleeping but arousable but difficult to obtain any information from.  She is unable to tell me if she is having any pain.  ROS: Level V caveat as patient is a poor historian  PAST MEDICAL HISTORY/PAST SURGICAL HISTORY:  Past Medical History  Diagnosis Date  . Essential hypertension, benign   . Type 2 diabetes mellitus ([place])   . Osteoporosis 06/2008  . Stroke [facility]) 03/2015    Rght hemiparesis  . History of parotid cancer     MEDICATIONS:  Prior to Admission medications   Medication Sig Start Date End Date Taking? Authorizing Provider  albuterol (PROVENTIL) (2.5 MG/3ML) 0.083% nebulizer solution Take 3 mLs (2.5 mg total) by nebulization every 2 (two) hours as needed for wheezing. 11/15/15   Orvan Falconer, MD  aspirin EC 81 MG EC tablet Take 1 tablet (81 mg total) by mouth daily. Patient taking differently: 81 mg by Gastric Tube route daily.  11/02/15   Erline Hau, MD  cephALEXin (KEFLEX) 500 MG capsule Place 1 capsule (500 mg total) into feeding tube 3 (three) times daily. 11/23/15   Rolland Porter, MD  clotrimazole-betamethasone (LOTRISONE) cream Apply 1 application topically daily as needed (APPLY A THIN LAYER AS BARRIER AFTER CLEANING AND DRYING BUTTOCKS AREA COMPLETELY).    Historical Provider, MD  FLUoxetine (PROZAC) 10 MG capsule TAKE 1 CAPSULE BY MOUTH DAILY FOR DEPRESSION/ANXIETY  Patient taking differently: TAKE 1 CAPSULE VIA PEG DAILY FOR DEPRESSION/ANXIETY 09/15/15   [name], MD  furosemide (LASIX) 20 MG tablet 20 mg by Gastric Tube route daily.     Historical Provider, MD  levETIRAcetam (KEPPRA) 750 MG tablet TAKE 1 TABLET BY MOUTH EVERY MORNING AND 2 TABLETS BY MOUTH EVERY NIGHT AT BEDTIME Patient taking differently: TAKE  1 TABLET VIA PEG TUBE IN THE MORNING & 2 TABLETS VIA PEG TUBE EVERY NIGHT AT BEDTIME 08/01/15   [name], MD  metFORMIN (GLUCOPHAGE) 500 MG tablet Take one half tablet BID Patient taking differently: 250 mg by Gastric Tube route 2 (two) times daily with a meal.  04/25/15   [name], MD  metoprolol tartrate (LOPRESSOR) 25 MG tablet 25 mg by Gastric Tube route 2 (two) times daily.     Historical Provider, MD  Nutritional Supplements (FEEDING SUPPLEMENT, VITAL AF 1.2 CAL,) LIQD Place 1,000 mLs into feeding tube daily. 10/21/15   [name], MD  nystatin cream (MYCOSTATIN) Apply 1 application topically 2 (two) times daily. Applied to vaginal rash until resolution    Historical Provider, MD  potassium chloride 20 MEQ/15ML (10%) SOLN Take 20 mEq by mouth daily.    Historical Provider, MD  pravastatin (PRAVACHOL) 40 MG tablet Place 1 tablet (40 mg total) into feeding tube every evening. Patient taking differently: 40 mg by Gastric Tube route every evening.  11/15/15   Orvan Falconer, MD  warfarin (COUMADIN) 4 MG tablet 4 mg by Gastric Tube route every evening.     Historical Provider, MD  Water For Irrigation, Sterile (FREE WATER) SOLN Place 200 mLs into feeding tube every 8 (eight) hours. 10/21/15   [name], MD    ALLERGIES:  No Known Allergies  SOCIAL HISTORY:  Social History  Substance Use Topics  . Smoking status: Former Smoker -- 0.25 packs/day for 30 years    Types: Cigarettes    Quit date: 02/27/2015  . Smokeless tobacco: Not on file  . Alcohol Use: No    FAMILY HISTORY: Family History  Problem Relation Age of Onset  . Cancer Father     Liver  . Cancer Sister     Breast  . Diabetes Brother   . Dementia Mother     EXAM: BP 137/63 mmHg  Pulse 93  Temp(Src) 97.1 F (36.2 C) (Axillary)  Resp 26  Wt 85 lb (38.556 kg)  SpO2 95% CONSTITUTIONAL: Alert but drowsy and falls asleep quickly, elderly, chronically ill-appearing, in no significant distress HEAD:  Normocephalic EYES: Conjunctivae clear, PERRL ENT: normal nose; no rhinorrhea; moist mucous membranes; pharynx without lesions noted NECK: Supple, no meningismus, no LAD  CARD: RRR; S1 and S2 appreciated; no murmurs, no clicks, no rubs, no gallops RESP: Normal chest excursion without splinting, minimally tachypneic but no significant increased work of breathing, no hypoxia, patient is satting 95% on 2 L nasal cannula, mild scattered exotropia wheezing on exam, bibasilar Rales, no rhonchi ABD/GI: Normal bowel sounds; non-distended; soft, non-tender, no rebound, no guarding, no peritoneal signs, PEG tube in the middle of the abdomen with no stranding erythema, warmth or drainage, patient has a chronic indwelling Foley catheter BACK:  The back appears normal and is non-tender to palpation, there is no CVA tenderness EXT: Normal ROM in all joints; non-tender to palpation; no edema; normal capillary refill; no cyanosis, no calf tenderness or swelling    SKIN: Normal color for age and race; warm NEURO: Right-sided hemiparesis at baseline, no facial droop,  no dysarthria   MEDICAL DECISION MAKING: Patient here shortness of breath, wheezing. Has a history of CHF, respiratory failure. Family reports that often her symptoms improve with albuterol, Lasix. We'll give albuterol, Atrovent, IV Lasix in the emergency department. We'll obtain labs, EKG and chest x-ray. Patient was recently discharged on Keflex on December 6 for UTI. Will repeat urinalysis.  ED PROGRESS: Patient's labs show worsening BNP. Troponin is also slightly positive at 0.05 but suspect this is demand ischemia from CHF exacerbation. Chest x-ray shows worsening pulmonary edema, CHF. INR is therapeutic. Urine shows UTI. Will repeat culture and give IV ceftriaxone. Given positive troponin, worsening CHF, will admit for further monitoring, IV diuresis. We'll give aspirin and her PEG tube. Updated patient's son who agrees with this plan. Pt's PCP is  Dr. Wolfgang Phoenix.   4:40 AM  D/w Dr. Marin Comment with hospitalist service for admission. He will see patient in place orders.    EKG Interpretation  Date/Time:  Friday November 25 2015 03:54:02 EST Ventricular Rate:  77 PR Interval:  174 QRS Duration: 83 QT Interval:  453 QTC Calculation: 513 R Axis:   -110 Text Interpretation:  Right and left arm electrode reversal, interpretation assumes no reversal Sinus or ectopic atrial rhythm Left anterior fascicular block Probable lateral infarct, old Anteroseptal infarct, old Abnrm T, probable ischemia, anterolateral lds Prolonged QT interval No significant change since last tracing Confirmed by Baylynn Shifflett,  DO, Mumin Denomme [phone]) on 11/25/2015 3:58:29 AM       EKG Interpretation  Date/Time:  Friday November 25 2015 04:03:03 EST Ventricular Rate:  81 PR Interval:  173 QRS Duration: 82 QT Interval:  441 QTC Calculation: 512 R Axis:   94 Text Interpretation:  Sinus rhythm Right axis deviation Consider left ventricular hypertrophy Anterior Q waves, possibly due to LVH Abnrm T, probable ischemia, anterolateral lds Prolonged QT interval No significant change since last tracing Confirmed by Lavelle Berland,  DO, Leelynd Maldonado [phone]) on 11/25/2015 4:34:01 AM        [place], DO 11/25/15 PO:[id number]

## 2015-11-25 NOTE — Progress Notes (Signed)
Daily Progress Note   Patient Name: Laura Melendez       Date: 11/25/2015 DOB: 11/02/1941  Age: 74 y.o. MRN#: [id number] Attending Physician: Laura Patricia, MD Primary Care Physician: Laura Lange, MD Admit Date: 11/25/2015  Reason for Consultation/Follow-up: Establishing goals of care and Psychosocial/spiritual support  Subjective: Laura Melendez is resting quietly in her bed.  She does not make eye contact or answer my questions.  I tell her that I'm sorry this has happened to her and that I will call her husband and talk about making her comfortable.  She nods her head yes to this.    Call to Laura Melendez.  We talk about Laura Melendez's BNP results, and treatment with Bumex.  We talk about the eventual return of fluid even if we do everything right.  I ask how Laura Melendez did with PT, "wasn't doing very much".  He tells me about her work with Laura Melendez in [place].  She was only taking 1-2 bites of ice chips, and stating "I don't want it".  Laura Melendez shares that he saw her yesterday and , "I can tells she's really kind of depressed and feeling bad."  We talk about her returning to [facility] and the issue with insurance.  He tells me that he and Laura Melendez completed papers for Medicaid yesterday.  We talk about [facility] services, Hospice in home, and Hospice in patient services.  He tells me that he might bring her home if he has some help.   Laura Melendez states, "If I could just get her to eat", and I share that this will not change.  He states, "I don't think it's going to happen."  He tells me that he doesn't know if he should keep trying. I ask if he has talked with Laura Melendez.  He has an appointment on Monday.   I share the concept of getting weaker after hospitalizations, and the time between coming to the hospital getting closer together.  He states "I can see it".    I share that no service will come 24/7, and we discuss the benefits of Hospice at home and in patient (dignity and comfort).  I encourage him to  keep Laura Melendez at the center of this plan.  I encourage Laura Melendez that he should listen to and follow his heart.     Length of Stay: none  Current Medications: Scheduled Meds:  . aspirin EC  81 mg Oral Daily  . bumetanide (BUMEX) IV  1 mg Intravenous Q12H  . [START ON 11/26/2015] cefTRIAXone (ROCEPHIN)  IV  1 g Intravenous Q24H  . feeding supplement (VITAL AF 1.2 CAL)  1,000 mL Per Tube Q24H  . FLUoxetine  10 mg Per Tube Daily  . free water  200 mL Per Tube 3 times per day  . insulin aspart  0-9 Units Subcutaneous 6 times per day  . levETIRAcetam  500 mg Oral BID  . metFORMIN  250 mg Oral BID WC  . metoprolol tartrate  25 mg Per Tube BID  . nystatin cream  1 application Topical BID  . potassium chloride  20 mEq Oral Daily  . pravastatin  40 mg Oral Daily  . sodium chloride  3 mL Intravenous Q12H  . warfarin  4 mg Oral Once  . Warfarin - Pharmacist Dosing Inpatient   Does not apply q1800    Continuous Infusions:    PRN Meds: albuterol  Palliative Performance Scale: 10%, tube feedings.  Vital Signs: BP 118/55 mmHg  Pulse 91  Temp(Src) 97.9 F (36.6 C) (Oral)  Resp 20  Wt 38.556 kg (85 lb)  SpO2 98% SpO2: SpO2: 98 % O2 Device: O2 Device: Nasal Cannula O2 Flow Rate: O2 Flow Rate (L/min): 2 L/min  Intake/output summary:  Intake/Output Summary (Last 24 hours) at 11/25/15 1258 Last data filed at 11/25/15 1050  Gross per 24 hour  Intake      3 ml  Output      0 ml  Net      3 ml   LBM:   Baseline Weight: Weight: 38.556 kg (85 lb) Most recent weight: Weight: 38.556 kg (85 lb)  Physical Exam: Constitutional: Ill appearing, frail elderly. She does not make eye contact or speak, but nods her head.   Cardiovascular:  RRR, no edema.  Pulmonary/Chest: Effort normal and breath sounds normal.  Abdominal: Soft, rounded.  There is no tenderness/guarding. PEG site   Nursing note and vitals reviewed.              Additional Data Reviewed: Recent Labs      11/22/15  2245   11/23/15  0830  11/25/15  0344  WBC  14.6*   --    --   11.2*  HGB  9.1*   --    --   9.4*  PLT  433*   --    --   411*  NA  139   < >  143  143  BUN  46*   < >  48*  51*  CREATININE  0.86   < >  0.98  0.93   < > = values in this interval not displayed.     Problem List:  Patient Active Problem List   Diagnosis Date Noted  . Acute on chronic diastolic CHF (congestive heart failure), NYHA class 1 (Laura Melendez) 11/25/2015  . Chronic anticoagulation 11/20/2015  . Rash and nonspecific skin eruption 11/20/2015  . Palliative care encounter   . DNR (do not resuscitate) discussion   . Acute on chronic diastolic (congestive) heart failure (Laura Melendez)   . Acute on chronic diastolic CHF (congestive heart failure) (Laura Melendez) 10/29/2015  . Malnutrition of moderate degree 10/19/2015  . Aspiration pneumonia (Laura Melendez) 10/17/2015  . Sepsis (Laura Melendez) 10/17/2015  . Acute respiratory failure with hypoxia (Laura Melendez) 10/17/2015  . Aortic mural thrombus (Laura Melendez) 05/06/2015  . 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 Right hemiparesis ([place]) 04/04/2015  . 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 DM (diabetes mellitus) (Laura Melendez) 03/23/2014  . Osteoporosis, unspecified 03/23/2014  . Dyslipidemia 03/23/2014  . Tobacco abuse 03/23/2014  . Syncope 01/01/2014  . Essential hypertension, benign 01/01/2014     Palliative Care Assessment & Plan    Code Status:  Limited code, OK for BiPAP and drugs.   Goals of Care:  Unsure at this time. Previous goal to rehab and return to home.   Symptom Management:  Bumex   Palliative Prophylaxis:  None at this time.   Psycho-social/Spiritual:  Desire for further Chaplaincy support:  Not discussed today.    Prognosis: < 3 months, likely  Discharge Planning: Laura Melendez for rehab with Palliative care service follow-up  likely,    Care plan was discussed with Nursing staff, CM, SW, and Dr. Landis Melendez.   Thank you for allowing the Palliative Medicine Team to assist  in the care of this patient.   Time In:  1200 Time Out: 1255 Total Time 55 minutes Prolonged Time Billed  no     Greater than 50%  of this time was spent counseling and coordinating care related to the above assessment and plan.   Drue Novel, NP  11/25/2015, 12:58 PM  Please contact Palliative Medicine Team phone at [phone] for questions and concerns.

## 2015-11-25 NOTE — H&P (Signed)
[facility] History and Physical  RADINE Melendez [id number] DOB: 06-Jul-1941    PCP:   [name], MD   Chief Complaint: another episode of SOB and wheezing.   HPI: LOUBERTHA Melendez is an 74 y.o. 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  Work up included an BNP at 3500, troponins 0.05, and CXR showed worsening CHF.  She is now comfortable sleeping.  EDP spoke with her son Laura Melendez, and we were asked to admit her for CHF> She has a MOST DNR order.   Rewiew of Systems:  Constitutional: Negative for malaise, fever and chills. No significant weight loss or weight gain Eyes: Negative for eye pain, redness and discharge, diplopia, visual changes, or flashes of light. ENMT: Negative for ear pain, hoarseness, nasal congestion, sinus pressure and sore throat. No headaches; tinnitus, drooling, or problem swallowing. Cardiovascular: Negative for chest pain, palpitations, diaphoresis, dyspnea and peripheral edema. ; No orthopnea, PND Respiratory: Negative for cough, hemoptysis, wheezing and stridor. No pleuritic chestpain. Gastrointestinal: Negative for nausea, vomiting, diarrhea, constipation, abdominal pain, melena, blood in stool, hematemesis, jaundice and rectal bleeding.    Genitourinary:  Negative for frequency, dysuria, incontinence,flank pain and hematuria; Musculoskeletal: Negative for back pain and neck pain. Negative for swelling and trauma.;  Skin: . Negative for pruritus, rash, abrasions, bruising and skin lesion.; ulcerations Neuro: Negative for headache, lightheadedness and neck stiffness. Negative for weakness, altered level of consciousness , altered mental status, extremity weakness, burning feet, involuntary movement, seizure and syncope.  Psych: negative for anxiety, depression, insomnia, tearfulness, panic attacks, hallucinations, paranoia, suicidal or homicidal ideation    Past Medical History  Diagnosis Date  . Essential hypertension, benign   . Type 2 diabetes mellitus ([place])   . Osteoporosis 06/2008  . Stroke [facility]) 03/2015    Rght hemiparesis  . History of parotid cancer     Past Surgical History  Procedure Laterality Date  . Ankle surgery Left 2004    otif  . Abdominal hysterectomy    . Anterior and posterior repair N/A 03/11/2013    Procedure: ANTERIOR (CYSTOCELE) AND POSTERIOR REPAIR (RECTOCELE);  Surgeon: Linda Hedges, DO;  Location: [facility];  Service: Gynecology;  Laterality: N/A;  with sacrospinous ligament fixation bilateral  . Colonoscopy  2009    negative  . Cataract extraction w/phaco Right 12/27/2014    Procedure: CATARACT EXTRACTION PHACO AND INTRAOCULAR LENS PLACEMENT RIGHT EYE;  Surgeon: Tonny Branch, MD;  Location: [facility];  Service: Ophthalmology;  Laterality: Right;  CDE:10.63  . Cataract extraction w/phaco Left 01/13/2015    Procedure: CATARACT EXTRACTION PHACO AND INTRAOCULAR LENS PLACEMENT LEFT EYE;  Surgeon: Tonny Branch, MD;  Location: [facility];  Service: Ophthalmology;  Laterality: Left;  CDE:18.87  . Cholecystectomy    . Tee without cardioversion N/A 04/01/2015    Procedure: TRANSESOPHAGEAL ECHOCARDIOGRAM (TEE);  Surgeon: Sanda Klein, MD;  Location: [place];  Service:  Cardiovascular;  Laterality: N/A;  . Peg placement    .  Parotidectomy    . Peg placement N/A 11/16/2015    Procedure: PERCUTANEOUS ENDOSCOPIC GASTROSTOMY (PEG) REPLACEMENT;  Surgeon: Rogene Houston, MD;  Location: [facility];  Service: Endoscopy;  Laterality: N/A;    Medications:  HOME MEDS: Prior to Admission medications   Medication Sig Start Date End Date Taking? Authorizing Provider  albuterol (PROVENTIL) (2.5 MG/3ML) 0.083% nebulizer solution Take 3 mLs (2.5 mg total) by nebulization every 2 (two) hours as needed for wheezing. 11/15/15   Orvan Falconer, MD  aspirin EC 81 MG EC tablet Take 1 tablet (81 mg total) by mouth daily. Patient taking differently: 81 mg by Gastric Tube route daily.  11/02/15   Erline Hau, MD  cephALEXin (KEFLEX) 500 MG capsule Place 1 capsule (500 mg total) into feeding tube 3 (three) times daily. 11/23/15   Rolland Porter, MD  clotrimazole-betamethasone (LOTRISONE) cream Apply 1 application topically daily as needed (APPLY A THIN LAYER AS BARRIER AFTER CLEANING AND DRYING BUTTOCKS AREA COMPLETELY).    Historical Provider, MD  FLUoxetine (PROZAC) 10 MG capsule TAKE 1 CAPSULE BY MOUTH DAILY FOR DEPRESSION/ANXIETY Patient taking differently: TAKE 1 CAPSULE VIA PEG DAILY FOR DEPRESSION/ANXIETY 09/15/15   [name], MD  furosemide (LASIX) 20 MG tablet 20 mg by Gastric Tube route daily.     Historical Provider, MD  levETIRAcetam (KEPPRA) 750 MG tablet TAKE 1 TABLET BY MOUTH EVERY MORNING AND 2 TABLETS BY MOUTH EVERY NIGHT AT BEDTIME Patient taking differently: TAKE 1 TABLET VIA PEG TUBE IN THE MORNING & 2 TABLETS VIA PEG TUBE EVERY NIGHT AT BEDTIME 08/01/15   [name], MD  metFORMIN (GLUCOPHAGE) 500 MG tablet Take one half tablet BID Patient taking differently: 250 mg by Gastric Tube route 2 (two) times daily with a meal.  04/25/15   [name], MD  metoprolol tartrate (LOPRESSOR) 25 MG tablet 25 mg by Gastric Tube route 2 (two) times daily.     Historical Provider, MD  Nutritional Supplements (FEEDING  SUPPLEMENT, VITAL AF 1.2 CAL,) LIQD Place 1,000 mLs into feeding tube daily. 10/21/15   [name], MD  nystatin cream (MYCOSTATIN) Apply 1 application topically 2 (two) times daily. Applied to vaginal rash until resolution    Historical Provider, MD  potassium chloride 20 MEQ/15ML (10%) SOLN Take 20 mEq by mouth daily.    Historical Provider, MD  pravastatin (PRAVACHOL) 40 MG tablet Place 1 tablet (40 mg total) into feeding tube every evening. Patient taking differently: 40 mg by Gastric Tube route every evening.  11/15/15   Orvan Falconer, MD  warfarin (COUMADIN) 4 MG tablet 4 mg by Gastric Tube route every evening.     Historical Provider, MD  Water For Irrigation, Sterile (FREE WATER) SOLN Place 200 mLs into feeding tube every 8 (eight) hours. 10/21/15   [name], MD     Allergies:  No Known Allergies  Social History:   reports that she quit smoking about 8 months ago. Her smoking use included Cigarettes. She has a 7.5 pack-year smoking history. She does not have any smokeless tobacco history on file. She reports that she does not drink alcohol or use illicit drugs.  Family History: Family History  Problem Relation Age of Onset  . Cancer Father     Liver  . Cancer Sister     Breast  . Diabetes Brother   . Dementia Mother      Physical Exam:  Filed Vitals:   11/25/15 0325 11/25/15 0406 11/25/15 0425 11/25/15 0449  BP: 137/63   119/63  Pulse: 93   92  Temp: 97.1 F (36.2 C)     TempSrc: Axillary     Resp: 26   20  Weight: 38.556 kg (85 lb)     SpO2: 95% 97% 98% 100%   Blood pressure 119/63, pulse 92, temperature 97.1 F (36.2 C), temperature source Axillary, resp. rate 20, weight 38.556 kg (85 lb), SpO2 100 %.  GEN:  Pleasant patient lying in the stretcher in no acute distress; cooperative with exam. PSYCH:  alert and oriented x4; does not appear anxious or depressed; affect is appropriate. HEENT: Mucous membranes pink and anicteric; PERRLA; EOM intact; no cervical  lymphadenopathy nor thyromegaly or carotid bruit; no JVD; There were no stridor. Neck is very supple. Breasts:: Not examined CHEST WALL: No tenderness CHEST: No wheezing.  Some bisilar rales heard. No wheezing.  HEART: Regular rate and rhythm.  There are no murmur, rub, or gallops.   BACK: No kyphosis or scoliosis; no CVA tenderness ABDOMEN: soft and non-tender; no masses, no organomegaly, normal abdominal bowel sounds; no pannus; no intertriginous candida. There is no rebound and no distention. Rectal Exam: Not done EXTREMITIES: No bone or joint deformity; age-appropriate arthropathy of the hands and knees; no edema; no ulcerations.  There is no calf tenderness. Genitalia: not examined PULSES: 2+ and symmetric SKIN: Normal hydration no rash or ulceration CNS: Cranial nerves 2-12 grossly intact no focal lateralizing neurologic deficit.  Speech is fluent; uvula elevated with phonation, facial symmetry and tongue midline. DTR are normal bilaterally, cerebella exam is intact, barbinski is negative and strengths are equaled bilaterally.  No sensory loss.   Labs on Admission:  Basic Metabolic Panel:  Recent Labs Lab 11/21/15 0600 11/22/15 2245 11/23/15 0500 11/23/15 0830 11/25/15 0344  NA 143 139 142 143 143  K 4.4 4.4 4.0 4.1 4.4  CL 110 100* 104 104 105  CO2 26 25 28 24 28   GLUCOSE 142* 205* 151* 147* 226*  BUN 43* 46* 49* 48* 51*  CREATININE 0.84 0.86 0.94 0.98 0.93  CALCIUM 9.1 9.4 9.1 9.2 9.6   Liver Function Tests:  Recent Labs Lab 11/22/15 2245  AST 23  ALT 17  ALKPHOS 41  BILITOT 0.3  PROT 6.7  ALBUMIN 2.9*   CBC:  Recent Labs Lab 11/19/15 1055 11/21/15 0600 11/22/15 2245 11/25/15 0344  WBC 9.2 10.4 14.6* 11.2*  NEUTROABS 7.0 7.4 12.9* 9.8*  HGB 9.8* 9.0* 9.1* 9.4*  HCT 32.0* 29.4* 29.8* 31.3*  MCV 81.0 81.4 80.8 81.7  PLT 503* 455* 433* 411*   Cardiac Enzymes:  Recent Labs Lab 11/25/15 0344  TROPONINI 0.05*    Radiological Exams on  Admission: Dg Chest Port 1 View  11/25/2015  CLINICAL DATA:  74 year old female with wheezing and shortness of breath EXAM: PORTABLE CHEST 1 VIEW COMPARISON:  Radiograph dated 11/22/2015 FINDINGS: Single portable view of the chest demonstrates cardiomegaly with bilateral central vascular prominence compatible with congestive changes. There has been slight interval increased in the congestive appearance compared to the prior study. No significant pleural effusion or pneumothorax. The costophrenic angles are not included in the image. The osseous structures are grossly unremarkable. IMPRESSION: Slight interval worsening of the congestive pattern. Follow-up recommended. Electronically Signed   By: Anner Crete M.D.   On: 11/25/2015 04:05    EKG: Independently reviewed.    Assessment/Plan Present on Admission:  . Acute on chronic  diastolic CHF (congestive heart failure) ([place]) . Malnutrition of moderate degree . Essential hypertension, benign . Acute on chronic diastolic CHF (congestive heart failure), NYHA class 1 (HCC)  PLAN:  Will admit her for acute on chronic CHF.  Maintain DNR code status with No CPR, Intubation, but cardiac meds are OK.  Will continue with cycling troponins, though I suspect it is from CHF and not an ACS.  Will continue her meds and I will diurese her with IV Bumex.  For her HTN,  Her BP is controlled. She is feeling better and we will admit her to telemetry.  Thank you for allowing me to participate in her care once more.   Other plans as per orders.  Code Status: Partial DNR (No CPR, no Intubation but cardiac and Bipap were OK)  Orvan Falconer, MD. [facility] Pager [phone] 7pm to 7am.  11/25/2015, 5:09 AM

## 2015-11-25 NOTE — Care Management Important Message (Signed)
Important Message  Patient Details  Name: Laura Melendez MRN: [id number] Date of Birth: Jul 13, 1941   Medicare Important Message Given:  Yes    Sherald Barge, RN 11/25/2015, 3:24 PM

## 2015-11-25 NOTE — Progress Notes (Signed)
Inpatient Diabetes Program Recommendations  AACE/ADA: New Consensus Statement on Inpatient Glycemic Control (2015)  Target Ranges:  Prepandial:   less than 140 mg/dL      Peak postprandial:   less than 180 mg/dL (1-2 hours)      Critically ill patients:  140 - 180 mg/dL  Results for Laura Melendez, Laura Melendez (MRN [id number]) as of 11/25/2015 07:29  Ref. Range 11/25/2015 03:44  Glucose Latest Ref Range: 65-99 mg/dL 226 (H)   Review of Glycemic Control  Diabetes history: DM2 Outpatient Diabetes medications: Metformin 250 mg BID Current orders for Inpatient glycemic control: Metformin 250 mg BID  Inpatient Diabetes Program Recommendations: Correction (SSI): While inpatient, please consider ordering CBGs with Novolog correction scale Q4H.  Thanks, Barnie Alderman, RN, MSN, CDE Diabetes Coordinator Inpatient Diabetes Program [phone] (Team Pager from [place] to [place]) [phone] ([facility] office) [phone] [facility] office) [phone] [facility] office)

## 2015-11-25 NOTE — Progress Notes (Signed)
PT Cancellation Note  Patient Details Name: Laura Melendez MRN: [id number] DOB: 1941/11/04   Cancelled Treatment:    Reason Eval/Treat Not Completed: Patient's level of consciousness.  Unable to awaken pt.   Demetrios Isaacs L  PT 11/25/2015, 11:44 AM [phone]

## 2015-11-25 NOTE — Clinical Social Work Note (Signed)
Clinical Social Work Assessment  Patient Details  Name: Laura Melendez MRN: [id number] Date of Birth: [birth year]  Date of referral:  11/25/15               Reason for consult:  Discharge Planning                Permission sought to share information with:    Permission granted to share information::     Name::        Agency::     Relationship::     Contact Information:     Housing/Transportation Living arrangements for the past 2 months:  [place] of Information:  Spouse Patient Interpreter Needed:  None Criminal Activity/Legal Involvement Pertinent to Current Situation/Hospitalization:  No - Comment as needed Significant Relationships:  Adult Children, Spouse Lives with:  Facility Resident Do you feel safe going back to the place where you live?  Yes Need for family participation in patient care:  Yes (Comment)  Care giving concerns:  Pt is resident at [facility].    Social Worker assessment / plan:  CSW attempted to meet with pt at bedside. Pt sleeping soundly. CSW spoke with pt's husband on phone. Pt is well known to CSW due to recent admissions. She has been a resident at [facility] for several weeks. Pt admitted due to acute on chronic CHF. She has a feeding tube and has been unable to make much progress with PT due to illness and hospital admissions. Pt's husband reports he feels that [place] may not continue to approve SNF. He is aware that options would be private pay or return home. He states he started Medicaid application yesterday. Pt's husband requests return to [facility] at d/c. Per Marianna Fuss at facility, [employer] has been initiated. PT unable to work with pt this morning as pt was not alert enough. No FL2 needed and pt is okay to return.   Employment status:  Retired Nurse, adult PT Recommendations:  Not assessed at this time Information / Referral to community resources:  Other (Comment Required) (return to  [facility])  Patient/Family's Response to care:  Pt's husband requests return to [facility] at d/c.   Patient/Family's Understanding of and Emotional Response to Diagnosis, Current Treatment, and Prognosis:  Pt's husband aware of pt's medical history and treatment plan.   Emotional Assessment Appearance:  Appears stated age Attitude/Demeanor/Rapport:  Unable to Assess Affect (typically observed):  Unable to Assess Orientation:    Alcohol / Substance use:  Not Applicable Psych involvement (Current and /or in the community):  No (Comment)  Discharge Needs  Concerns to be addressed:  Discharge Planning Concerns Readmission within the last 30 days:  Yes Current discharge risk:  Chronically ill Barriers to Discharge:  Continued Medical Work up   [employer], [place] 11/25/2015, 12:06 PM [phone]

## 2015-11-25 NOTE — Progress Notes (Signed)
ANTICOAGULATION CONSULT NOTE - Initial Consult  Pharmacy Consult for coumadin Indication: atrial fibrillation  No Known Allergies  Patient Measurements: Weight: 85 lb (38.556 kg)   Vital Signs: Temp: 97.1 F (36.2 C) (12/09 0325) Temp Source: Axillary (12/09 0325) BP: 123/63 mmHg (12/09 0530) Pulse Rate: 95 (12/09 0530)  Labs:  Recent Labs  11/22/15 2245 11/23/15 0500 11/23/15 0830 11/25/15 0344 11/25/15 0628  HGB 9.1*  --   --  9.4*  --   HCT 29.8*  --   --  31.3*  --   PLT 433*  --   --  411*  --   LABPROT  --  36.2* 37.7* 27.4*  --   INR  --  3.75* 3.96* 2.59*  --   CREATININE 0.86 0.94 0.98 0.93  --   TROPONINI  --   --   --  0.05* 0.06*    Estimated Creatinine Clearance: 32.3 mL/min (by C-G formula based on Cr of 0.93).   Medical History: Past Medical History  Diagnosis Date  . Essential hypertension, benign   . Type 2 diabetes mellitus ([place])   . Osteoporosis 06/2008  . Stroke [facility]) 03/2015    Rght hemiparesis  . History of parotid cancer     Medications:  Prescriptions prior to admission  Medication Sig Dispense Refill Last Dose  . albuterol (PROVENTIL) (2.5 MG/3ML) 0.083% nebulizer solution Take 3 mLs (2.5 mg total) by nebulization every 2 (two) hours as needed for wheezing. 75 mL 12 unknown  . aspirin EC 81 MG EC tablet Take 1 tablet (81 mg total) by mouth daily. (Patient taking differently: 81 mg by Gastric Tube route daily. )   11/22/2015 at [place]  . cephALEXin (KEFLEX) 500 MG capsule Place 1 capsule (500 mg total) into feeding tube 3 (three) times daily. 30 capsule 0   . clotrimazole-betamethasone (LOTRISONE) cream Apply 1 application topically daily as needed (APPLY A THIN LAYER AS BARRIER AFTER CLEANING AND DRYING BUTTOCKS AREA COMPLETELY).   UNKNOWN  . FLUoxetine (PROZAC) 10 MG capsule TAKE 1 CAPSULE BY MOUTH DAILY FOR DEPRESSION/ANXIETY (Patient taking differently: TAKE 1 CAPSULE VIA PEG DAILY FOR DEPRESSION/ANXIETY) 90 capsule 1 11/22/2015 at [place]   . furosemide (LASIX) 20 MG tablet 20 mg by Gastric Tube route daily.    11/22/2015 at [place]  . levETIRAcetam (KEPPRA) 750 MG tablet TAKE 1 TABLET BY MOUTH EVERY MORNING AND 2 TABLETS BY MOUTH EVERY NIGHT AT BEDTIME (Patient taking differently: TAKE 1 TABLET VIA PEG TUBE IN THE MORNING & 2 TABLETS VIA PEG TUBE EVERY NIGHT AT BEDTIME) 270 tablet 3 11/22/2015 at 2100  . metFORMIN (GLUCOPHAGE) 500 MG tablet Take one half tablet BID (Patient taking differently: 250 mg by Gastric Tube route 2 (two) times daily with a meal. ) 30 tablet 5 11/22/2015 at 1700  . metoprolol tartrate (LOPRESSOR) 25 MG tablet 25 mg by Gastric Tube route 2 (two) times daily.    11/22/2015 at 2100  . Nutritional Supplements (FEEDING SUPPLEMENT, VITAL AF 1.2 CAL,) LIQD Place 1,000 mLs into feeding tube daily.   11/05/2015 at Unknown time  . nystatin cream (MYCOSTATIN) Apply 1 application topically 2 (two) times daily. Applied to vaginal rash until resolution   11/22/2015 at 2100  . potassium chloride 20 MEQ/15ML (10%) SOLN Take 20 mEq by mouth daily.   11/22/2015 at 900a  . pravastatin (PRAVACHOL) 40 MG tablet Place 1 tablet (40 mg total) into feeding tube every evening. (Patient taking differently: 40 mg by Gastric Tube route every  evening. ) 30 tablet 1 11/22/2015 at 2100  . warfarin (COUMADIN) 4 MG tablet 4 mg by Gastric Tube route every evening.    11/22/2015 at 1700  . Water For Irrigation, Sterile (FREE WATER) SOLN Place 200 mLs into feeding tube every 8 (eight) hours.   11/22/2015 at Unknown time    Assessment: 74 yo lady to continue coumadin for afib.  INR is therapeutic. Goal of Therapy:  INR 2-3 Monitor platelets by anticoagulation protocol: Yes   Plan:  Coumadin 4 mg po today Daily PT/INR  Thanks for allowing pharmacy to be a part of this patient's care.  Excell Seltzer, PharmD Clinical Pharmacist  11/25/2015,8:33 AM

## 2015-11-25 NOTE — Care Management Note (Signed)
Case Management Note  Patient Details  Name: Laura Melendez MRN: [id number] Date of Birth: 1941/06/08  Subjective/Objective:                  Pt admitted for CHF exacerbation. Pt is from [facility] SNF.  Pt not waking up to work with PT this morning. Palliative consult pending to continue [place] discussion with family.   Action/Plan: Anticipate pt will return to SNF. CSW is aware of DC plan. Will cont to follow.   Expected Discharge Date:                  Expected Discharge Plan:  Skilled Nursing Facility  In-House Referral:  Clinical Social Work, Hospice / Palliative Care  Discharge planning Services  CM Consult  Post Acute Care Choice:  NA Choice offered to:  NA  DME Arranged:    DME Agency:     HH Arranged:    HH Agency:     Status of Service:  In process, will continue to follow  Medicare Important Message Given:    Date Medicare IM Given:    Medicare IM give by:    Date Additional Medicare IM Given:    Additional Medicare Important Message give by:     If discussed at [place] of Stay Meetings, dates discussed:    Additional Comments:  Sherald Barge, RN 11/25/2015, 12:48 PM

## 2015-11-25 NOTE — Evaluation (Signed)
Physical Therapy Evaluation Patient Details Name: Laura Melendez MRN: [id number] DOB: Nov 10, 1941 Today's Date: 11/25/2015   History of Present Illness  Laura Melendez is an 74 y.o. 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 Work up included an BNP at 3500, troponins 0.05, and CXR showed worsening CHF. She is now comfortable sleeping. EDP spoke with her son Marzetta Board, and we were asked to admit her for CHF> She has a MOST DNR order.   Clinical Impression   Pt was seen for evaluation.  She is well known to this therapist from frequent admissions in recent past.  She is found to have the same problems as found at the last evaluation 2 weeks ago.  Her sitting balance is poor and she falls to the right.  She needs total assist is transfer bed to chair.  She seems to be less oriented today than in the past but she was cooperative and able to follow most simple directions.    Follow Up Recommendations SNF    Equipment Recommendations  None recommended by PT    Recommendations for Other Services OT consult     Precautions / Restrictions Precautions Precautions: Fall Restrictions Weight Bearing Restrictions: No      Mobility  Bed Mobility Overal bed mobility: Needs Assistance Bed Mobility:  Rolling Rolling: Min assist   Supine to sit: Mod assist     General bed mobility comments: continues to fall to the right upon sitting  Transfers Overall transfer level: Needs assistance Equipment used: None Transfers: Stand Pivot Transfers Sit to Stand: Total assist Stand pivot transfers: Total assist          Ambulation/Gait             General Gait Details: unable  Stairs            Wheelchair Mobility    Modified Rankin (Stroke Patients Only)       Balance Overall balance assessment: Needs assistance Sitting-balance support: Bilateral upper extremity supported;Feet supported Sitting balance-Leahy Scale: Poor Sitting balance - Comments: falls to the right                                      Pertinent Vitals/Pain Pain Assessment: Faces Faces Pain Scale: No hurt    Home Living Family/patient expects to be discharged to:: Skilled nursing facility                      Prior Function Level of Independence: Needs assistance   Gait / Transfers Assistance Needed: pt has been at [facility] since last admission and it is not known if she has made any progress on transfers or gait...pt is not able to tell me  ADL's / Homemaking Assistance Needed: full assist has been needed for quite some time due  to multiple medical problems        Hand Dominance   Dominant Hand: Right    Extremity/Trunk Assessment   Upper Extremity Assessment: Generalized weakness           Lower Extremity Assessment: Generalized weakness      Cervical / Trunk Assessment: Kyphotic  Communication   Communication: Expressive difficulties (minimally verbal although she is able to speak a bit.)  Cognition Arousal/Alertness: Awake/alert Behavior During Therapy: Flat affect Overall Cognitive Status: Impaired/Different from baseline Area of Impairment: Orientation;Following commands Orientation Level: Disoriented to;Place;Time;Situation     Following  Commands: Follows one step commands inconsistently            General Comments      Exercises        Assessment/Plan    PT Assessment All further PT needs can be met in the next venue of care  PT Diagnosis Generalized weakness   PT Problem List    PT Treatment Interventions     PT Goals (Current goals can be found in the Care Plan section) Acute Rehab PT Goals PT Goal Formulation: All assessment and education complete, DC therapy    Frequency     Barriers to discharge   complexity of medical issues prevents return home    Co-evaluation               End of Session Equipment Utilized During Treatment: Gait belt Activity Tolerance: Patient tolerated treatment well Patient left: in chair;with call bell/phone within reach;with chair alarm set Nurse Communication: Mobility status         Time: 7948-0165 PT Time Calculation (min) (ACUTE ONLY): 34 min   Charges:   PT Evaluation $Initial PT Evaluation Tier I: 1 Procedure     PT G CodesDemetrios Isaacs L  PT 11/25/2015, 2:02 PM [phone]

## 2015-11-25 NOTE — Progress Notes (Signed)
Nutrition Follow-Up  INTERVENTION:  If agreeable with pt/family goals of care continuous enteral feeding Vital 1.2 ml/hr @ 42 ml/hr via PEG  Provides 1200  kcal, 75 gr protein and 811 ml water. Meets 100% energy and 100% protein needs.  free water flushes to -200 ml BID and 30 ml water before and after medications.     NUTRITION DIAGNOSIS:  Predicted suboptimal oral nutrient intake related to swallow difficulty: ongoing. Pt PEG providing 100% of nutriton.  GOAL: Provide nutrition needs based on ASPEN/SCCM guidelines; met    MONITOR: TF tolerance, Labs, Weight trends, Skin   REASON FOR ASSESSMENT: Enteral feeding/NPO    ASSESSMENT:  Pt has hx of Chronic CHF, DM type 2, CVA, dysphagia, aspiration PNA and is s/p PEG placement. Multiple hospitalizations over the past 90 days. Her weight has been up and down with fluctuating fluid status. Also it's been difficult for her to maintain continuous feeding with her unstable health problems. Last admission her code status was updated and a palliative consult is pending for follow-up this admission.  Her tube feeding was resumed this afternoon: Vital 1.2 @ 40 ml/hr which will provide her with 1152 kcal, 72 gr protein and 779 ml water.   Abnormal Labs: Today BUN 51, glucose 226 mg/dl but glucose was 151,147 on 12/7. Pt tube feeding has not been on today.   Diet Order:  Diet NPO time specified  Skin:   no new concerns   Last BM: 12/9   Height:   Ht Readings from Last 1 Encounters:  11/22/15 5' (1.524 m)    Weight:   Wt Readings from Last 1 Encounters:  11/25/15 85 lb (38.556 kg)    Ideal Body Weight:   59 kg  BMI:  Body mass index is 16.6 kg/(m^2). underweight  Re-Estimated Nutritional Needs:   Kcal:  5597-4163  Protein:   60-70 gr  Fluid:   1.3 liters daily  EDUCATION NEEDS: none at this time     Colman Cater MS,RD,CSG,LDN Office: #[phone] Pager: [phone]

## 2015-11-26 ENCOUNTER — Telehealth (HOSPITAL_BASED_OUTPATIENT_CLINIC_OR_DEPARTMENT_OTHER): Payer: Self-pay | Admitting: Emergency Medicine

## 2015-11-26 LAB — CBC
HCT: 26.3 % — ABNORMAL LOW (ref 36.0–46.0)
HEMOGLOBIN: 8 g/dL — AB (ref 12.0–15.0)
MCH: 24.6 pg — ABNORMAL LOW (ref 26.0–34.0)
MCHC: 30.4 g/dL (ref 30.0–36.0)
MCV: 80.9 fL (ref 78.0–100.0)
PLATELETS: 406 10*3/uL — AB (ref 150–400)
RBC: 3.25 MIL/uL — ABNORMAL LOW (ref 3.87–5.11)
RDW: 16.5 % — ABNORMAL HIGH (ref 11.5–15.5)
WBC: 7.4 10*3/uL (ref 4.0–10.5)

## 2015-11-26 LAB — GLUCOSE, CAPILLARY
GLUCOSE-CAPILLARY: 130 mg/dL — AB (ref 65–99)
Glucose-Capillary: 156 mg/dL — ABNORMAL HIGH (ref 65–99)
Glucose-Capillary: 147 mg/dL — ABNORMAL HIGH (ref 65–99)
Glucose-Capillary: 144 mg/dL — ABNORMAL HIGH (ref 65–99)
Glucose-Capillary: 132 mg/dL — ABNORMAL HIGH (ref 65–99)

## 2015-11-26 LAB — BASIC METABOLIC PANEL
ANION GAP: 8 (ref 5–15)
BUN: 55 mg/dL — ABNORMAL HIGH (ref 6–20)
CALCIUM: 9.2 mg/dL (ref 8.9–10.3)
CO2: 27 mmol/L (ref 22–32)
CREATININE: 1.02 mg/dL — AB (ref 0.44–1.00)
Chloride: 109 mmol/L (ref 101–111)
GFR, EST NON AFRICAN AMERICAN: 53 mL/min — AB (ref >60)
GLUCOSE: 166 mg/dL — AB (ref 65–99)
Potassium: 3.7 mmol/L (ref 3.5–5.1)
Sodium: 144 mmol/L (ref 135–145)

## 2015-11-26 LAB — PROTIME-INR
INR: 2.59 — AB (ref 0.00–1.49)
PROTHROMBIN TIME: 27.4 s — AB (ref 11.6–15.2)

## 2015-11-26 MED ORDER — WARFARIN SODIUM 2 MG PO TABS
4.0000 mg | ORAL_TABLET | Freq: Once | ORAL | Status: AC
  Administered 2015-11-26: 4 mg via ORAL
  Filled 2015-11-26: qty 2

## 2015-11-26 NOTE — Progress Notes (Signed)
[facility] Nurse/Amy came and communicated with family regarding services and what decisions they want to proceed with.  Nurse was able to speak with Dr. Waldron Labs regarding Hospice and d/c plans for patient.  Pending for patient to either D/C to [facility], [facility], or home.  If patient goes home they will need equipment/bed for home and this can not be arranged until Monday.  Criteria to meet standards for Medicare/placement.  Nurse reviewed labs 11/22/15  Albumin 2.9 low/Malnutrition.   Cardiology report 11/15/15: Ejection Fraction report Range 50-55 has to be less than 25%.    Hospice nurse reviewed current H&P and Palliative Team progress notes.  Pending D/C over the weekend or Monday.

## 2015-11-26 NOTE — Progress Notes (Addendum)
CM received call regarding Hospice services for patient.  CM contacted [facility] spoke with Carlyon Shadow the representative and she  will forward message to on call nurse to follow up with CM.   Received call from on call nurse Amy from Hospice.  Informed she is coming to [facility] to review the patient's case for Hospice services.   CM communicated with doctor the nurse will be here to visit patient.

## 2015-11-26 NOTE — Progress Notes (Signed)
Patient Demographics  Laura Melendez, is a 74 y.o. female, DOB - 1941-10-21, [id number]  Admit date - 11/25/2015   Admitting Physician Orvan Falconer, MD  Outpatient Primary MD for the patient is [name], MD  LOS - 1   Chief Complaint  Patient presents with  . Shortness of Breath      Admission history of present illness/brief narrative: Laura Melendez is an 74 y.o. 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   Subjective:   Laura Melendez today appears more awake, But still can't provide any complaints.  Assessment & Plan    Principal Problem:   Acute on chronic diastolic CHF (congestive heart failure) (HCC) Active Problems:   Essential hypertension, benign   DM (diabetes mellitus) (HCC)   Right hemiparesis (HCC)   Malnutrition of moderate degree   Acute on chronic diastolic CHF (congestive heart failure), NYHA class 1 (HCC)  Acute on chronic diastolic CHF - Patient with elevated BNP at 3500, chest x-ray showing worsening CHF. -Improving with IV diuresis , tolerating room air . -  continue with strict -694  since admission.  Elevated troponins - This is most likely related to CHF , troponins plateaued  0.05>0.06>0.06>0.05, no further workup .   Atrial fibrillation - Currently normal sinus rhythm, on warfarin for  anticoagulation, dosed by pharmacy  UTI - Continue with IV Rocephin, follow on urine cultures  History of CVA - Patient is on warfarin  Hyperlipidemia - Continue with statin  Diabetes mellitus - Continue with metformin, on insulin sliding scale  History of seizures - Continue with Keppra  Dysphagia - Status post PEG.   Code Status: Partial  Family Communication: Son at bedside  Disposition Plan: pending further workup,and hospice evaluation    Procedures  None   Consults   Palliative   Medications  Scheduled Meds: . aspirin EC  81 mg Oral Daily  . bumetanide (BUMEX) IV  1 mg Intravenous Q12H  . cefTRIAXone (ROCEPHIN)  IV  1 g Intravenous Q24H  . feeding supplement (VITAL AF 1.2 CAL)  1,000 mL Per Tube Q24H  . FLUoxetine  10 mg Per Tube Daily  . free water  200 mL Per Tube 3 times per day  . insulin aspart  0-9 Units Subcutaneous 6 times per day  . levETIRAcetam  500 mg Oral BID  . metFORMIN  250 mg Oral BID WC  . metoprolol tartrate  25 mg Per Tube BID  . nystatin cream  1 application Topical BID  . potassium chloride  20 mEq Oral Daily  . pravastatin  40 mg Oral Daily  . sodium chloride  3 mL Intravenous Q12H  . warfarin  4 mg Oral Once  . Warfarin - Pharmacist Dosing Inpatient   Does  not apply q1800   Continuous Infusions:  PRN Meds:.albuterol  DVT Prophylaxis  On warfarin  Lab Results  Component Value Date   PLT 406* 11/26/2015    Antibiotics    Anti-infectives    Start     Dose/Rate Route Frequency Ordered Stop   11/26/15 0500  cefTRIAXone (ROCEPHIN) 1 g in dextrose 5 % 50 mL IVPB     1 g 100 mL/hr over 30 Minutes Intravenous Every 24 hours 11/25/15 0617 12/03/15 0459   11/25/15 0515  cefTRIAXone (ROCEPHIN) 1 g in dextrose 5 % 50 mL IVPB  Status:  Discontinued     1 g 100 mL/hr over 30 Minutes Intravenous Every 24 hours 11/25/15 0508 11/25/15 0616   11/25/15 0445  cefTRIAXone (ROCEPHIN) 1 g in dextrose 5 % 50 mL IVPB     1 g 100 mL/hr  over 30 Minutes Intravenous  Once 11/25/15 0431 11/25/15 0524          Objective:   Filed Vitals:   11/25/15 1441 11/25/15 2200 11/26/15 0657 11/26/15 0925  BP: 125/68 132/66 133/70 151/66  Pulse: 87 92 98 103  Temp: 98.3 F (36.8 C) 98.2 F (36.8 C) 98.5 F (36.9 C) 98.6 F (37 C)  TempSrc: Oral Oral Oral Oral  Resp: 18 18 18 18   Weight:      SpO2: 97% 97% 98% 98%    Wt Readings from Last 3 Encounters:  11/25/15 38.556 kg (85 lb)  11/22/15 38.556 kg (85 lb)  11/17/15 38.556 kg (85 lb)     Intake/Output Summary (Last 24 hours) at 11/26/15 1302 Last data filed at 11/26/15 0953  Gross per 24 hour  Intake      3 ml  Output    700 ml  Net   -697 ml     Physical Exam   awake , confused  Symmetrical Chest wall movement, Good air movement bilaterally, no wheezing RRR,No Gallops,Rubs, No Parasternal Heave +ve B.Sounds, Abd Soft, No tenderness, PEG + No Cyanosis, Clubbing or edema, No new Rash or bruise     Data Review   Micro Results Recent Results (from the past 240 hour(s))  Urine culture     Status: None   Collection Time: 11/22/15 10:15 PM  Result Value Ref Range Status   Specimen Description URINE, CATHETERIZED  Final   Special Requests NONE  Final   Culture   Final    >=100,000 COLONIES/mL KLEBSIELLA PNEUMONIAE Performed at [facility]    Report Status 11/25/2015 FINAL  Final   Organism ID, Bacteria KLEBSIELLA PNEUMONIAE  Final      Susceptibility   Klebsiella pneumoniae - MIC*    AMPICILLIN >=32 RESISTANT Resistant     CEFAZOLIN <=4 SENSITIVE Sensitive     CEFTRIAXONE <=1 SENSITIVE Sensitive     CIPROFLOXACIN <=0.25 SENSITIVE Sensitive     GENTAMICIN <=1 SENSITIVE Sensitive     IMIPENEM <=0.25 SENSITIVE Sensitive     NITROFURANTOIN 32 SENSITIVE Sensitive     TRIMETH/SULFA <=20 SENSITIVE Sensitive     AMPICILLIN/SULBACTAM 8 SENSITIVE Sensitive     PIP/TAZO 8 SENSITIVE Sensitive     * >=100,000 COLONIES/mL KLEBSIELLA PNEUMONIAE  Urine  culture     Status: None (Preliminary result)   Collection Time: 11/25/15  4:32 AM  Result Value Ref Range Status   Specimen Description URINE, CATHETERIZED  Final   Special Requests NONE  Final   Culture   Final    TOO YOUNG TO READ Performed at [facility]  [facility]    Report Status PENDING  Incomplete    Radiology Reports Ct Head Wo Contrast  11/13/2015  CLINICAL DATA:  Altered mental status, lethargy EXAM: CT HEAD WITHOUT CONTRAST TECHNIQUE: Contiguous axial images were obtained from the base of the skull through the vertex without intravenous contrast. COMPARISON:  10/13/2015 FINDINGS: No skull fracture is noted. No intracranial hemorrhage, mass effect or midline shift. No acute cortical infarction. No mass lesion is noted on this unenhanced scan. The gray and white-matter differentiation is preserved. Stable cerebral atrophy. Extensive periventricular and patchy subcortical chronic white matter disease again noted. IMPRESSION: No acute intracranial abnormality. Stable atrophy and extensive chronic white matter disease. Electronically Signed   By: Lahoma Crocker M.D.   On: 11/13/2015 15:13   Dg Chest Port 1 View  11/25/2015  CLINICAL DATA:  74 year old female with wheezing and shortness of breath EXAM: PORTABLE CHEST 1 VIEW COMPARISON:  Radiograph dated 11/22/2015 FINDINGS: Single portable view of the chest demonstrates cardiomegaly with bilateral central vascular prominence compatible with congestive changes. There has been slight interval increased in the congestive appearance compared to the prior study. No significant pleural effusion or pneumothorax. The costophrenic angles are not included in the image. The osseous structures are grossly unremarkable. IMPRESSION: Slight interval worsening of the congestive pattern. Follow-up recommended. Electronically Signed   By: Anner Crete M.D.   On: 11/25/2015 04:05   Dg Chest Portable 1 View  11/22/2015  CLINICAL DATA:  Shortness of breath.   Wheezing.  Chest congestion. EXAM: PORTABLE CHEST 1 VIEW COMPARISON:  11/17/2015 FINDINGS: Moderate cardiomegaly is stable. Diffuse interstitial infiltrates are again seen, suspicious for interstitial edema. Small bilateral pleural effusions also stable. IMPRESSION: Stable appearance of mild congestive heart failure and small bilateral pleural effusions. Electronically Signed   By: Earle Gell M.D.   On: 11/22/2015 23:10   Dg Chest Port 1 View  11/17/2015  CLINICAL DATA:  74 year old female with increasing shortness of breath. EXAM: PORTABLE CHEST 1 VIEW COMPARISON:  Chest radiograph dated 11/14/2015 FINDINGS: Single-view of the chest demonstrate cardiomegaly with increased central vascular and interstitial prominence compatible with pulmonary edema. There is no focal consolidation or pneumothorax. A small pleural effusion may be present. Osseous structures are grossly unremarkable. IMPRESSION: Cardiomegaly with findings of pulmonary edema. Electronically Signed   By: Anner Crete M.D.   On: 11/17/2015 01:13   Dg Chest Port 1 View  11/14/2015  CLINICAL DATA:  Aspiration EXAM: PORTABLE CHEST 1 VIEW COMPARISON:  11/13/2015 FINDINGS: There is unchanged cardiomegaly. Central and basilar ground-glass opacities persist, likely alveolar edema. Pleural effusions persist bilaterally. No pneumothorax. IMPRESSION: Unchanged, with persisting cardiomegaly, pleural effusions and alveolar edema Electronically Signed   By: Andreas Newport M.D.   On: 11/14/2015 06:42   Dg Chest Port 1 View  11/13/2015  CLINICAL DATA:  74 year old female with pneumonia EXAM: PORTABLE CHEST 1 VIEW COMPARISON:  Prior chest x-ray 11/09/2015 FINDINGS: Overall, no significant interval change in the appearance of the chest. Persistent cardiomegaly and mild pulmonary edema. There are moderate bilateral layering pleural effusions with associated bibasilar opacities. No pneumothorax. No support apparatus. IMPRESSION: No significant interval  change in the appearance of the chest compared to 11/09/2015. Stable cardiomegaly, mild pulmonary edema and moderate bilateral layering effusions with associated bibasilar atelectasis. Superimposed infiltrate in the lung bases would be difficult to exclude radiographically. Electronically Signed   By: Jacqulynn Cadet M.D.   On: 11/13/2015 08:54   Dg Chest Port 1 View  11/09/2015  CLINICAL  DATA:  Acute respiratory failure with hypoxia. Aspiration pneumonia. Congestive heart failure. EXAM: PORTABLE CHEST 1 VIEW COMPARISON:  11/08/2015 FINDINGS: Cardiomegaly stable. Pulmonary vascular congestion and symmetric lower lung airspace disease shows no significant change allowing for ladder radiograph technique, most consistent with pulmonary edema. Probable layering bilateral pleural effusions again noted. IMPRESSION: No significant change in cardiomegaly, probable diffuse pulmonary edema, and bilateral pleural effusions, consistent with congestive heart failure. Electronically Signed   By: Earle Gell M.D.   On: 11/09/2015 07:44   Dg Chest Port 1 View  11/08/2015  CLINICAL DATA:  Hypoxia EXAM: PORTABLE CHEST - 1 VIEW COMPARISON:  11/08/2015 FINDINGS: Cardiac shadow remains enlarged. Bibasilar changes are again seen with pleural effusions. Mild vascular congestion is noted. No focal confluent infiltrate is seen. No bony abnormality is noted. Gastrostomy catheter is noted over the upper abdomen. IMPRESSION: Stable appearance of the chest when compare with the prior exam consistent with mild CHF. Electronically Signed   By: Inez Catalina M.D.   On: 11/08/2015 14:41   Dg Chest Port 1 View  11/08/2015  CLINICAL DATA:  Pneumonia, cough EXAM: PORTABLE CHEST 1 VIEW COMPARISON:  11/07/2015 FINDINGS: Cardiomegaly with vascular congestion. Bilateral layering effusions and lower lobe opacities, likely edema. Findings are similar to prior study. No acute bony abnormality. IMPRESSION: Suspect mild CHF with bilateral lower  lobe opacities, likely edema and layering effusions. No real change. Electronically Signed   By: Rolm Baptise M.D.   On: 11/08/2015 10:09   Dg Chest Port 1 View  11/07/2015  CLINICAL DATA:  Aspiration pneumonia. EXAM: PORTABLE CHEST 1 VIEW COMPARISON:  11/06/2015. FINDINGS: Cardiomegaly with pulmonary vascular prominence and interstitial prominence consistent with congestive heart failure. Bibasilar pneumonia cannot be excluded . Small bilateral effusions. No pneumothorax. IMPRESSION: Findings suggest congestive heart failure with bilateral pulmonary edema and small pleural effusions. Bibasilar pneumonia cannot be excluded. No significant change from prior exam. Electronically Signed   By: [place]   On: 11/07/2015 08:31   Dg Chest Portable 1 View  11/06/2015  CLINICAL DATA:  Shortness of breath. EXAM: PORTABLE CHEST 1 VIEW COMPARISON:  10/29/2015 FINDINGS: Worsening bibasilar opacities from prior with developing haziness suggestive of pleural effusions. Vascular congestion is unchanged. Cardiomediastinal contours with mild cardiomegaly are unchanged. No pneumothorax. The bones are under mineralized IMPRESSION: Worsening bibasilar opacities with developing lower lobe haziness suggestive of pleural effusions. Aspiration, pneumonia, or pleural effusions with compressive atelectasis. Electronically Signed   By: Jeb Levering M.D.   On: 11/06/2015 05:32   Dg Chest Portable 1 View  10/29/2015  CLINICAL DATA:  Respiratory distress EXAM: PORTABLE CHEST - 1 VIEW COMPARISON:  10/17/2015 FINDINGS: Cardiac shadow is stable. Lungs are well aerated bilaterally. Mild vascular congestion is noted as well as bibasilar increased density likely related atelectasis or early infiltrate. No bony abnormality is seen. IMPRESSION: Increasing bibasilar densities and mild vascular congestion. Electronically Signed   By: Inez Catalina M.D.   On: 10/29/2015 20:52     CBC  Recent Labs Lab 11/21/15 0600  11/22/15 2245 11/25/15 0344 11/26/15 0636  WBC 10.4 14.6* 11.2* 7.4  HGB 9.0* 9.1* 9.4* 8.0*  HCT 29.4* 29.8* 31.3* 26.3*  PLT 455* 433* 411* 406*  MCV 81.4 80.8 81.7 80.9  MCH 24.9* 24.7* 24.5* 24.6*  MCHC 30.6 30.5 30.0 30.4  RDW 16.7* 16.8* 16.6* 16.5*  LYMPHSABS 1.9 1.0 0.9  --   MONOABS 0.7 0.6 0.4  --   EOSABS 0.3 0.1 0.0  --  BASOSABS 0.1 0.1 0.1  --     Chemistries   Recent Labs Lab 11/22/15 2245 11/23/15 0500 11/23/15 0830 11/25/15 0344 11/26/15 0636  NA 139 142 143 143 144  K 4.4 4.0 4.1 4.4 3.7  CL 100* 104 104 105 109  CO2 25 28 24 28 27   GLUCOSE 205* 151* 147* 226* 166*  BUN 46* 49* 48* 51* 55*  CREATININE 0.86 0.94 0.98 0.93 1.02*  CALCIUM 9.4 9.1 9.2 9.6 9.2  AST 23  --   --   --   --   ALT 17  --   --   --   --   ALKPHOS 41  --   --   --   --   BILITOT 0.3  --   --   --   --    ------------------------------------------------------------------------------------------------------------------ estimated creatinine clearance is 29.5 mL/min (by C-G formula based on Cr of 1.02). ------------------------------------------------------------------------------------------------------------------ No results for input(s): HGBA1C in the last 72 hours. ------------------------------------------------------------------------------------------------------------------ No results for input(s): CHOL, HDL, LDLCALC, TRIG, CHOLHDL, LDLDIRECT in the last 72 hours. ------------------------------------------------------------------------------------------------------------------ No results for input(s): TSH, T4TOTAL, T3FREE, THYROIDAB in the last 72 hours.  Invalid input(s): FREET3 ------------------------------------------------------------------------------------------------------------------ No results for input(s): VITAMINB12, FOLATE, FERRITIN, TIBC, IRON, RETICCTPCT in the last 72 hours.  Coagulation profile  Recent Labs Lab 11/23/15 0500 11/23/15 0830  11/25/15 0344 11/26/15 0636  INR 3.75* 3.96* 2.59* 2.59*    No results for input(s): DDIMER in the last 72 hours.  Cardiac Enzymes  Recent Labs Lab 11/25/15 0628 11/25/15 1155 11/25/15 1847  TROPONINI 0.06* 0.06* 0.05*   ------------------------------------------------------------------------------------------------------------------ Invalid input(s): POCBNP     Time Spent in minutes   25 minutes   Jshon Ibe M.D on 11/26/2015 at 1:02 PM  Between 7am to 7pm - Pager - [phone]  After 7pm go to [email or web address] - password [facility]  [facility]   Office  [phone]

## 2015-11-26 NOTE — Progress Notes (Signed)
ANTICOAGULATION CONSULT NOTE   Pharmacy Consult for coumadin Indication: atrial fibrillation  No Known Allergies  Patient Measurements: Weight: 85 lb (38.556 kg)   Vital Signs: Temp: 98.6 F (37 C) (12/10 0925) Temp Source: Oral (12/10 0925) BP: 151/66 mmHg (12/10 0925) Pulse Rate: 103 (12/10 0925)  Labs:  Recent Labs  11/25/15 0344 11/25/15 0628 11/25/15 1155 11/25/15 1847 11/26/15 0636  HGB 9.4*  --   --   --  8.0*  HCT 31.3*  --   --   --  26.3*  PLT 411*  --   --   --  406*  LABPROT 27.4*  --   --   --  27.4*  INR 2.59*  --   --   --  2.59*  CREATININE 0.93  --   --   --  1.02*  TROPONINI 0.05* 0.06* 0.06* 0.05*  --     Estimated Creatinine Clearance: 29.5 mL/min (by C-G formula based on Cr of 1.02).   Medical History: Past Medical History  Diagnosis Date  . Essential hypertension, benign   . Type 2 diabetes mellitus ([place])   . Osteoporosis 06/2008  . 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 History of parotid cancer     Medications:  Prescriptions prior to admission  Medication Sig Dispense Refill Last Dose  . albuterol (PROVENTIL) (2.5 MG/3ML) 0.083% nebulizer solution Take 3 mLs (2.5 mg total) by nebulization every 2 (two) hours as needed for wheezing. 75 mL 12 11/25/2015 at 225a  . aspirin EC 81 MG EC tablet Take 1 tablet (81 mg total) by mouth daily. (Patient taking differently: 81 mg by Gastric Tube route daily. )   11/25/2015 at [place]  . cephALEXin (KEFLEX) 500 MG capsule Place 1 capsule (500 mg total) into feeding tube 3 (three) times daily. 30 capsule 0 11/25/2015 at 1425  . FLUoxetine (PROZAC) 10 MG capsule TAKE 1 CAPSULE BY MOUTH DAILY FOR DEPRESSION/ANXIETY (Patient taking differently: TAKE 1 CAPSULE VIA PEG DAILY FOR DEPRESSION/ANXIETY) 90 capsule 1 11/25/2015 at 915a  . furosemide (LASIX) 20 MG tablet 60 mg by Gastric Tube route daily.    11/25/2015 at 915a  . levETIRAcetam (KEPPRA) 750 MG tablet TAKE 1 TABLET BY MOUTH EVERY MORNING AND 2 TABLETS  BY MOUTH EVERY NIGHT AT BEDTIME (Patient taking differently: TAKE 1 TABLET VIA PEG TUBE IN THE MORNING & 2 TABLETS VIA PEG TUBE EVERY NIGHT AT BEDTIME) 270 tablet 3 11/25/2015 at 915a  . metFORMIN (GLUCOPHAGE) 500 MG tablet Take one half tablet BID (Patient taking differently: 250 mg by Gastric Tube route 2 (two) times daily with a meal. ) 30 tablet 5 11/25/2015 at 915a  . metoprolol tartrate (LOPRESSOR) 25 MG tablet 25 mg by Gastric Tube route 2 (two) times daily.    11/25/2015 at [place]  . Nutritional Supplements (FEEDING SUPPLEMENT, VITAL AF 1.2 CAL,) LIQD Place 1,000 mLs into feeding tube daily.   11/25/2015 at 915s  . nystatin cream (MYCOSTATIN) Apply 1 application topically 2 (two) times daily. Applied to vaginal rash until resolution   11/22/2015 at 2100  . pravastatin (PRAVACHOL) 40 MG tablet Place 1 tablet (40 mg total) into feeding tube every evening. (Patient taking differently: 40 mg by Gastric Tube route every evening. ) 30 tablet 1 11/24/2015 at 2100  . Water For Irrigation, Sterile (FREE WATER) SOLN Place 200 mLs into feeding tube every 8 (eight) hours.   11/25/2015 at Unknown time    Assessment: 74 yo lady to continue  coumadin for afib.  INR is therapeutic. Goal of Therapy:  INR 2-3 Monitor platelets by anticoagulation protocol: Yes   Plan:  Coumadin 4 mg po today Daily PT/INR  Thanks for allowing pharmacy to be a part of this patient's care.  Excell Seltzer, PharmD Clinical Pharmacist  11/26/2015,9:48 AM

## 2015-11-26 NOTE — Progress Notes (Signed)
Patient been awake all night.  Patient has been confused all night, only oriented to self.  Family left bedside at 2100.  Patient has been awake and throwing legs consistently over bed and constantly  repositioning herself in bed.  Patient is combative at times and reorientation to patient is unsuccessful. Will continue supportive measures to patient.

## 2015-11-26 NOTE — Telephone Encounter (Signed)
Post ED Visit - Positive Culture Follow-up  Culture report reviewed by antimicrobial stewardship pharmacist:  []  Elenor Quinones, Pharm.D. []  Heide Guile, Pharm.D., BCPS []  Parks Neptune, Pharm.D. []  Alycia Rossetti, Pharm.D., BCPS []  [employer], Pharm.D., BCPS, AAHIVP []  Legrand Como, Pharm.D., BCPS, AAHIVP [x]  Milus Glazier, Pharm.D. []  Stephens November, Pharm.D.  Positive urine culture Klebsiella Treated with cephalexin, organism sensitive to the same and no further patient follow-up is required at this time.  Hazle Nordmann 11/26/2015, 9:06 AM

## 2015-11-27 ENCOUNTER — Inpatient Hospital Stay: Admission: RE | Admit: 2015-11-27 | Payer: Medicare HMO | Source: Ambulatory Visit | Admitting: Family Medicine

## 2015-11-27 LAB — URINE CULTURE: Culture: >=100000

## 2015-11-27 LAB — GLUCOSE, CAPILLARY
GLUCOSE-CAPILLARY: 144 mg/dL — AB (ref 65–99)
GLUCOSE-CAPILLARY: 159 mg/dL — AB (ref 65–99)
GLUCOSE-CAPILLARY: 117 mg/dL — AB (ref 65–99)
Glucose-Capillary: 155 mg/dL — ABNORMAL HIGH (ref 65–99)

## 2015-11-27 LAB — PROTIME-INR
INR: 3.11 — AB (ref 0.00–1.49)
Prothrombin Time: 31.4 seconds — ABNORMAL HIGH (ref 11.6–15.2)

## 2015-11-27 NOTE — Progress Notes (Signed)
ANTICOAGULATION CONSULT NOTE   Pharmacy Consult for coumadin Indication: atrial fibrillation  No Known Allergies  Patient Measurements: Weight: 85 lb (38.556 kg)   Vital Signs: Temp: 97.9 F (36.6 C) (12/11 0517) Temp Source: Oral (12/11 0517) BP: 151/70 mmHg (12/11 0517) Pulse Rate: 90 (12/11 0517)  Labs:  Recent Labs  11/25/15 0344 11/25/15 0628 11/25/15 1155 11/25/15 1847 11/26/15 0636 11/27/15 0556  HGB 9.4*  --   --   --  8.0*  --   HCT 31.3*  --   --   --  26.3*  --   PLT 411*  --   --   --  406*  --   LABPROT 27.4*  --   --   --  27.4* 31.4*  INR 2.59*  --   --   --  2.59* 3.11*  CREATININE 0.93  --   --   --  1.02*  --   TROPONINI 0.05* 0.06* 0.06* 0.05*  --   --     Estimated Creatinine Clearance: 29.5 mL/min (by C-G formula based on Cr of 1.02).   Medical History: Past Medical History  Diagnosis Date  . Essential hypertension, benign   . Type 2 diabetes mellitus ([place])   . Osteoporosis 06/2008  . 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 History of parotid cancer     Medications:  Prescriptions prior to admission  Medication Sig Dispense Refill Last Dose  . albuterol (PROVENTIL) (2.5 MG/3ML) 0.083% nebulizer solution Take 3 mLs (2.5 mg total) by nebulization every 2 (two) hours as needed for wheezing. 75 mL 12 11/25/2015 at 225a  . aspirin EC 81 MG EC tablet Take 1 tablet (81 mg total) by mouth daily. (Patient taking differently: 81 mg by Gastric Tube route daily. )   11/25/2015 at [place]  . cephALEXin (KEFLEX) 500 MG capsule Place 1 capsule (500 mg total) into feeding tube 3 (three) times daily. 30 capsule 0 11/25/2015 at 1425  . FLUoxetine (PROZAC) 10 MG capsule TAKE 1 CAPSULE BY MOUTH DAILY FOR DEPRESSION/ANXIETY (Patient taking differently: TAKE 1 CAPSULE VIA PEG DAILY FOR DEPRESSION/ANXIETY) 90 capsule 1 11/25/2015 at 915a  . furosemide (LASIX) 20 MG tablet 60 mg by Gastric Tube route daily.    11/25/2015 at 915a  . levETIRAcetam (KEPPRA) 750 MG  tablet TAKE 1 TABLET BY MOUTH EVERY MORNING AND 2 TABLETS BY MOUTH EVERY NIGHT AT BEDTIME (Patient taking differently: TAKE 1 TABLET VIA PEG TUBE IN THE MORNING & 2 TABLETS VIA PEG TUBE EVERY NIGHT AT BEDTIME) 270 tablet 3 11/25/2015 at 915a  . metFORMIN (GLUCOPHAGE) 500 MG tablet Take one half tablet BID (Patient taking differently: 250 mg by Gastric Tube route 2 (two) times daily with a meal. ) 30 tablet 5 11/25/2015 at 915a  . metoprolol tartrate (LOPRESSOR) 25 MG tablet 25 mg by Gastric Tube route 2 (two) times daily.    11/25/2015 at [place]  . Nutritional Supplements (FEEDING SUPPLEMENT, VITAL AF 1.2 CAL,) LIQD Place 1,000 mLs into feeding tube daily.   11/25/2015 at 915s  . nystatin cream (MYCOSTATIN) Apply 1 application topically 2 (two) times daily. Applied to vaginal rash until resolution   11/22/2015 at 2100  . pravastatin (PRAVACHOL) 40 MG tablet Place 1 tablet (40 mg total) into feeding tube every evening. (Patient taking differently: 40 mg by Gastric Tube route every evening. ) 30 tablet 1 11/24/2015 at 2100  . Water For Irrigation, Sterile (FREE WATER) SOLN Place 200 mLs into feeding tube  every 8 (eight) hours.   11/25/2015 at Unknown time    Assessment: 74 yo lady to continue coumadin for afib.  INR is slightly supratherapeutic.   Goal of Therapy:  INR 2-3 Monitor platelets by anticoagulation protocol: Yes   Plan:  No coumadin today Daily PT/INR  Thanks for allowing pharmacy to be a part of this patient's care.  Excell Seltzer, PharmD Clinical Pharmacist  11/27/2015,8:51 AM

## 2015-11-27 NOTE — Progress Notes (Addendum)
Patient ID: Laura Melendez, female   DOB: [birth year], 74 y.o.   MRN: [id number]                PROGRESS NOTE  DATE:  11/23/2015        FACILITY: [facility]                       LEVEL OF CARE:   SNF   Acute Visit                 CHIEF COMPLAINT:  Review, post trip to the ER last night.      HISTORY OF PRESENT ILLNESS:  Mrs. Tallmadge apparently developed respiratory distress last night with increasing complaints of shortness of breath and O2 sats in the upper 80s.  She was sent to the emergency room.  Chest x-ray still suggested interstitial edema.  Her BNP was over 2000, although previously this was over 4000.  She was given IV Lasix.     She was also felt to have a UTI, placed on Keflex, but the culture is pending.    Past Medical History  Diagnosis Date  . Essential hypertension, benign   . Type 2 diabetes mellitus ([place])   . Osteoporosis 06/2008  . 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 History of parotid cancer      REVIEW OF SYSTEMS:   Really difficult in this patient today.  She is not as conversational.    PHYSICAL EXAMINATION:   VITAL SIGNS:     PULSE:  87.    02 SATURATIONS:  95%.   CHEST/RESPIRATORY:  Shallow, but otherwise clear air entry bilaterally.    CARDIOVASCULAR:   CARDIAC:  Heart sounds are normal.  Her JVP appears to be elevated.        GASTROINTESTINAL:   ABDOMEN:  Soft.     LIVER/SPLEEN/KIDNEYS:  No liver, no spleen.  No tenderness.    CIRCULATION:   EDEMA/VARICOSITIES:  Extremities:  No edema.  No evidence of a DVT.    ASSESSMENT/PLAN:                      Congestive heart failure.  I think this is true.  The ER gave her extra Lasix IV last night.  Lab work showed a BUN of 49, creatinine of 0.94.  I am going to increase her Lasix from 40 mg to 60 mg a day.    ?Recurrent aspiration pneumonitis.  Once again, she seemed to choke when I was in the room.  She was perhaps at 10.     Coagulopathy.  Her INR is 3.75 today.  Coumadin will  need to be adjusted.

## 2015-11-27 NOTE — Progress Notes (Signed)
Left via EMS for transport to [place].  PEG flushed and clamped, IV saline locked, foley catheter emptied and intact.  Family present.  [facility] informed of patient leaving for transport.

## 2015-11-27 NOTE — Progress Notes (Signed)
Left via wheelchair for discharge home in care of family.  Stable at discharge, congested cough, no respiratory distress.

## 2015-11-27 NOTE — Discharge Instructions (Signed)
Further Management per [facility] place.

## 2015-11-27 NOTE — Progress Notes (Signed)
Report called to [facility], discharge information faxed, medical necessity completed. Per [facility], leave foley catheter intact and IV access.  Tube feeding stopped and flushed, then clamped.  IV saline locked.  Will call [facility] when patient leaves with EMS.

## 2015-11-27 NOTE — Discharge Summary (Signed)
Laura Melendez, is a 74 y.o. female  DOB [birth year]  MRN [id number].  Admission date:  11/25/2015  Admitting Physician  Orvan Falconer, MD  Discharge Date:  11/27/2015   Primary MD  [name], MD  Recommendations for primary care physician for things to follow:  - Further management as per [facility] place.   Admission Diagnosis  Elevated troponin [R79.89] Acute on chronic systolic congestive heart failure (HCC) [I50.23] Urinary tract infection with hematuria, site unspecified [N39.0, R31.9]   Discharge Diagnosis  Elevated troponin [R79.89] Acute on chronic systolic congestive heart failure (HCC) [I50.23] Urinary tract infection with hematuria, site unspecified [N39.0, R31.9]    Principal Problem:   Acute on chronic diastolic CHF (congestive heart failure) (HCC) Active Problems:   Essential hypertension, benign   DM (diabetes mellitus) ([place])   Right hemiparesis (HCC)   Malnutrition of moderate degree   Acute on chronic diastolic CHF (congestive heart failure), NYHA class 1 (HCC)      Past Medical History  Diagnosis Date  . Essential hypertension, benign   . Type 2 diabetes mellitus ([place])   . Osteoporosis 06/2008  . Stroke [facility]) 03/2015    Rght hemiparesis  . History of parotid cancer     Past Surgical History  Procedure Laterality Date  . Ankle surgery Left 2004    otif  . Abdominal hysterectomy    . Anterior and posterior repair N/A 03/11/2013    Procedure: ANTERIOR (CYSTOCELE) AND POSTERIOR REPAIR (RECTOCELE);  Surgeon: Linda Hedges, DO;  Location: [facility];  Service: Gynecology;  Laterality: N/A;  with sacrospinous ligament fixation bilateral  . Colonoscopy  2009    negative  . Cataract extraction w/phaco Right 12/27/2014    Procedure: CATARACT EXTRACTION PHACO AND INTRAOCULAR LENS PLACEMENT RIGHT EYE;  Surgeon: Tonny Branch, MD;  Location: [facility];  Service: Ophthalmology;   Laterality: Right;  CDE:10.63  . Cataract extraction w/phaco Left 01/13/2015    Procedure: CATARACT EXTRACTION PHACO AND INTRAOCULAR LENS PLACEMENT LEFT EYE;  Surgeon: Tonny Branch, MD;  Location: [facility];  Service: Ophthalmology;  Laterality: Left;  CDE:18.87  . Cholecystectomy    . Tee without cardioversion N/A 04/01/2015    Procedure: TRANSESOPHAGEAL ECHOCARDIOGRAM (TEE);  Surgeon: Sanda Klein, MD;  Location: [facility] ENDOSCOPY;  Service: Cardiovascular;  Laterality: N/A;  . Peg placement    . Parotidectomy    . Peg placement N/A 11/16/2015    Procedure: PERCUTANEOUS ENDOSCOPIC GASTROSTOMY (PEG) REPLACEMENT;  Surgeon: Rogene Houston, MD;  Location: [facility];  Service: Endoscopy;  Laterality: N/A;       History of present illness and  Hospital Course:     Kindly see H&P for history of present illness and admission details, please review complete Labs, Consult reports and Test reports for all details in brief  HPI  from the history and physical done on the day of admission 11/25/2015 Laura Melendez is an 74 y.o. 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 Work up included an BNP at 3500, troponins 0.05, and CXR showed worsening CHF. She is now comfortable sleeping. EDP spoke with her son Marzetta Board,  and we were asked to admit her for CHF> She has a MOST DNR order.   Hospital Course  NATOYA Melendez is an 74 y.o. 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  Patient was seen by palliative care, goals of care were discussed with family, overall severe deconditioning, malnutrition, and poor life quality, and overall poor prognosis was discussed, family met with hospice, and plan is to discharge for hospice home.  Acute on chronic diastolic CHF - Patient with elevated BNP at 3500, chest x-ray showing worsening CHF. -Improving with IV diuresis , tolerating room air . -Patient will be discharged on oral Lasix to prevent recurrence of dyspnea.  Elevated troponins - This is most likely related to CHF , troponins plateaued 0.05>0.06>0.06>0.05, no further workup .  Atrial fibrillation - Currently normal sinus rhythm,warfarin stopped on discharge giving she is going to hospice .  UTIDue to klebsiella pneumonia  - Continue with IV Rocephin  History of CVA  Hyperlipidemia  Diabetes mellitus  History of seizures - Continue with Keppra  Dysphagia - Status post PEG.    Discharge Condition:   overall poor long-term prognosis .       Discharge Instructions  and  Discharge Medications     Discharge Instructions    Discharge instructions    Complete by:  As  directed   Management per [facility] place.            Medication List    STOP taking these medications        aspirin 81 MG EC tablet     cephALEXin 500 MG capsule  Commonly known as:  KEFLEX     feeding supplement (VITAL AF 1.2 CAL) Liqd     metFORMIN 500 MG tablet  Commonly known as:  GLUCOPHAGE     metoprolol tartrate 25 MG tablet  Commonly known as:  LOPRESSOR     pravastatin 40 MG tablet  Commonly known as:  PRAVACHOL      TAKE these medications        albuterol (2.5 MG/3ML) 0.083% nebulizer solution  Commonly known as:  PROVENTIL  Take 3 mLs (2.5 mg total) by nebulization every 2 (two) hours as needed for wheezing.  FLUoxetine 10 MG capsule  Commonly known as:  PROZAC  TAKE 1 CAPSULE BY MOUTH DAILY FOR DEPRESSION/ANXIETY     free water Soln  Place 200 mLs into feeding tube every 8 (eight) hours.     furosemide 20 MG tablet  Commonly known as:  LASIX  60 mg by Gastric Tube route daily.     levETIRAcetam 750 MG tablet  Commonly known as:  KEPPRA  TAKE 1 TABLET BY MOUTH EVERY MORNING AND 2 TABLETS BY MOUTH EVERY NIGHT AT BEDTIME     nystatin cream  Commonly known as:  MYCOSTATIN  Apply 1 application topically 2 (two) times daily. Applied to vaginal rash until resolution          Diet and Activity recommendation: See Discharge Instructions above   Consults obtained -  Palliative    Major procedures and Radiology Reports - PLEASE review detailed and final reports for all details, in brief -      Ct Head Wo Contrast  11/13/2015  CLINICAL DATA:  Altered mental status, lethargy EXAM: CT HEAD WITHOUT CONTRAST TECHNIQUE: Contiguous axial images were obtained from the base of the skull through the vertex without intravenous contrast. COMPARISON:  10/13/2015 FINDINGS: No skull fracture is noted. No intracranial hemorrhage, mass effect or midline shift. No acute cortical infarction. No mass lesion is noted on this unenhanced scan. The  gray and white-matter differentiation is preserved. Stable cerebral atrophy. Extensive periventricular and patchy subcortical chronic white matter disease again noted. IMPRESSION: No acute intracranial abnormality. Stable atrophy and extensive chronic white matter disease. Electronically Signed   By: Lahoma Crocker M.D.   On: 11/13/2015 15:13   Dg Chest Port 1 View  11/25/2015  CLINICAL DATA:  74 year old female with wheezing and shortness of breath EXAM: PORTABLE CHEST 1 VIEW COMPARISON:  Radiograph dated 11/22/2015 FINDINGS: Single portable view of the chest demonstrates cardiomegaly with bilateral central vascular prominence compatible with congestive changes. There has been slight interval increased in the congestive appearance compared to the prior study. No significant pleural effusion or pneumothorax. The costophrenic angles are not included in the image. The osseous structures are grossly unremarkable. IMPRESSION: Slight interval worsening of the congestive pattern. Follow-up recommended. Electronically Signed   By: Anner Crete M.D.   On: 11/25/2015 04:05   Dg Chest Portable 1 View  11/22/2015  CLINICAL DATA:  Shortness of breath.  Wheezing.  Chest congestion. EXAM: PORTABLE CHEST 1 VIEW COMPARISON:  11/17/2015 FINDINGS: Moderate cardiomegaly is stable. Diffuse interstitial infiltrates are again seen, suspicious for interstitial edema. Small bilateral pleural effusions also stable. IMPRESSION: Stable appearance of mild congestive heart failure and small bilateral pleural effusions. Electronically Signed   By: Earle Gell M.D.   On: 11/22/2015 23:10   Dg Chest Port 1 View  11/17/2015  CLINICAL DATA:  74 year old female with increasing shortness of breath. EXAM: PORTABLE CHEST 1 VIEW COMPARISON:  Chest radiograph dated 11/14/2015 FINDINGS: Single-view of the chest demonstrate cardiomegaly with increased central vascular and interstitial prominence compatible with pulmonary edema. There is no focal  consolidation or pneumothorax. A small pleural effusion may be present. Osseous structures are grossly unremarkable. IMPRESSION: Cardiomegaly with findings of pulmonary edema. Electronically Signed   By: Anner Crete M.D.   On: 11/17/2015 01:13   Dg Chest Port 1 View  11/14/2015  CLINICAL DATA:  Aspiration EXAM: PORTABLE CHEST 1 VIEW COMPARISON:  11/13/2015 FINDINGS: There is unchanged cardiomegaly. Central and basilar ground-glass opacities persist, likely alveolar edema. Pleural effusions persist bilaterally. No  pneumothorax. IMPRESSION: Unchanged, with persisting cardiomegaly, pleural effusions and alveolar edema Electronically Signed   By: Daniel R Mitchell M.D.   On: 11/14/2015 06:42   Dg Chest Port 1 View  11/13/2015  CLINICAL DATA:  74-year-old female with pneumonia EXAM: PORTABLE CHEST 1 VIEW COMPARISON:  Prior chest x-ray 11/09/2015 FINDINGS: Overall, no significant interval change in the appearance of the chest. Persistent cardiomegaly and mild pulmonary edema. There are moderate bilateral layering pleural effusions with associated bibasilar opacities. No pneumothorax. No support apparatus. IMPRESSION: No significant interval change in the appearance of the chest compared to 11/09/2015. Stable cardiomegaly, mild pulmonary edema and moderate bilateral layering effusions with associated bibasilar atelectasis. Superimposed infiltrate in the lung bases would be difficult to exclude radiographically. Electronically Signed   By: Heath  McCullough M.D.   On: 11/13/2015 08:54   Dg Chest Port 1 View  11/09/2015  CLINICAL DATA:  Acute respiratory failure with hypoxia. Aspiration pneumonia. Congestive heart failure. EXAM: PORTABLE CHEST 1 VIEW COMPARISON:  11/08/2015 FINDINGS: Cardiomegaly stable. Pulmonary vascular congestion and symmetric lower lung airspace disease shows no significant change allowing for ladder radiograph technique, most consistent with pulmonary edema. Probable layering bilateral  pleural effusions again noted. IMPRESSION: No significant change in cardiomegaly, probable diffuse pulmonary edema, and bilateral pleural effusions, consistent with congestive heart failure. Electronically Signed   By: John  Stahl M.D.   On: 11/09/2015 07:44   Dg Chest Port 1 View  11/08/2015  CLINICAL DATA:  Hypoxia EXAM: PORTABLE CHEST - 1 VIEW COMPARISON:  11/08/2015 FINDINGS: Cardiac shadow remains enlarged. Bibasilar changes are again seen with pleural effusions. Mild vascular congestion is noted. No focal confluent infiltrate is seen. No bony abnormality is noted. Gastrostomy catheter is noted over the upper abdomen. IMPRESSION: Stable appearance of the chest when compare with the prior exam consistent with mild CHF. Electronically Signed   By: Mark  Lukens M.D.   On: 11/08/2015 14:41   Dg Chest Port 1 View  11/08/2015  CLINICAL DATA:  Pneumonia, cough EXAM: PORTABLE CHEST 1 VIEW COMPARISON:  11/07/2015 FINDINGS: Cardiomegaly with vascular congestion. Bilateral layering effusions and lower lobe opacities, likely edema. Findings are similar to prior study. No acute bony abnormality. IMPRESSION: Suspect mild CHF with bilateral lower lobe opacities, likely edema and layering effusions. No real change. Electronically Signed   By: Kevin  Dover M.D.   On: 11/08/2015 10:09   Dg Chest Port 1 View  11/07/2015  CLINICAL DATA:  Aspiration pneumonia. EXAM: PORTABLE CHEST 1 VIEW COMPARISON:  11/06/2015. FINDINGS: Cardiomegaly with pulmonary vascular prominence and interstitial prominence consistent with congestive heart failure. Bibasilar pneumonia cannot be excluded . Small bilateral effusions. No pneumothorax. IMPRESSION: Findings suggest congestive heart failure with bilateral pulmonary edema and small pleural effusions. Bibasilar pneumonia cannot be excluded. No significant change from prior exam. Electronically Signed   By: Thomas  Register   On: 11/07/2015 08:31   Dg Chest Portable 1 View  11/06/2015   CLINICAL DATA:  Shortness of breath. EXAM: PORTABLE CHEST 1 VIEW COMPARISON:  10/29/2015 FINDINGS: Worsening bibasilar opacities from prior with developing haziness suggestive of pleural effusions. Vascular congestion is unchanged. Cardiomediastinal contours with mild cardiomegaly are unchanged. No pneumothorax. The bones are under mineralized IMPRESSION: Worsening bibasilar opacities with developing lower lobe haziness suggestive of pleural effusions. Aspiration, pneumonia, or pleural effusions with compressive atelectasis. Electronically Signed   By: Melanie  Ehinger M.D.   On: 11/06/2015 05:32   Dg Chest Portable 1 View  10/29/2015  CLINICAL DATA:  Respiratory distress EXAM:   PORTABLE CHEST - 1 VIEW COMPARISON:  10/17/2015 FINDINGS: Cardiac shadow is stable. Lungs are well aerated bilaterally. Mild vascular congestion is noted as well as bibasilar increased density likely related atelectasis or early infiltrate. No bony abnormality is seen. IMPRESSION: Increasing bibasilar densities and mild vascular congestion. Electronically Signed   By: Inez Catalina M.D.   On: 10/29/2015 20:52    Micro Results     Recent Results (from the past 240 hour(s))  Urine culture     Status: None   Collection Time: 11/22/15 10:15 PM  Result Value Ref Range Status   Specimen Description URINE, CATHETERIZED  Final   Special Requests NONE  Final   Culture   Final    >=100,000 COLONIES/mL KLEBSIELLA PNEUMONIAE Performed at [facility]    Report Status 11/25/2015 FINAL  Final   Organism ID, Bacteria KLEBSIELLA PNEUMONIAE  Final      Susceptibility   Klebsiella pneumoniae - MIC*    AMPICILLIN >=32 RESISTANT Resistant     CEFAZOLIN <=4 SENSITIVE Sensitive     CEFTRIAXONE <=1 SENSITIVE Sensitive     CIPROFLOXACIN <=0.25 SENSITIVE Sensitive     GENTAMICIN <=1 SENSITIVE Sensitive     IMIPENEM <=0.25 SENSITIVE Sensitive     NITROFURANTOIN 32 SENSITIVE Sensitive     TRIMETH/SULFA <=20 SENSITIVE Sensitive      AMPICILLIN/SULBACTAM 8 SENSITIVE Sensitive     PIP/TAZO 8 SENSITIVE Sensitive     * >=100,000 COLONIES/mL KLEBSIELLA PNEUMONIAE  Urine culture     Status: None   Collection Time: 11/25/15  4:32 AM  Result Value Ref Range Status   Specimen Description URINE, CATHETERIZED  Final   Special Requests NONE  Final   Culture   Final    >=100,000 COLONIES/mL YEAST 40,000 COLONIES/ml KLEBSIELLA PNEUMONIAE Performed at [facility]    Report Status 11/27/2015 FINAL  Final   Organism ID, Bacteria KLEBSIELLA PNEUMONIAE  Final      Susceptibility   Klebsiella pneumoniae - MIC*    AMPICILLIN >=32 RESISTANT Resistant     CEFAZOLIN <=4 SENSITIVE Sensitive     CEFTRIAXONE <=1 SENSITIVE Sensitive     CIPROFLOXACIN <=0.25 SENSITIVE Sensitive     GENTAMICIN <=1 SENSITIVE Sensitive     IMIPENEM <=0.25 SENSITIVE Sensitive     NITROFURANTOIN 32 SENSITIVE Sensitive     TRIMETH/SULFA <=20 SENSITIVE Sensitive     AMPICILLIN/SULBACTAM 8 SENSITIVE Sensitive     PIP/TAZO 8 SENSITIVE Sensitive     * 40,000 COLONIES/ml KLEBSIELLA PNEUMONIAE       Today   Subjective:   [name] toda is more awake and alert, can't provide any complaints.  Blood pressure 151/70, pulse 90, temperature 97.9 F (36.6 C), temperature source Oral, resp. rate 18, weight 38.556 kg (85 lb), SpO2 96 %.   Intake/Output Summary (Last 24 hours) at 11/27/15 1416 Last data filed at 11/27/15 1000  Gross per 24 hour  Intake      3 ml  Output   1351 ml  Net  -1348 ml    Exam awake , confused  Symmetrical Chest wall movement, Good air movement bilaterally, no wheezing RRR,No Gallops,Rubs, No Parasternal Heave +ve B.Sounds, Abd Soft, No tenderness, PEG + No Cyanosis, Clubbing or edema, No new Rash or bruise  Data Review   CBC w Diff: Lab Results  Component Value Date   WBC 7.4 11/26/2015   HGB 8.0* 11/26/2015   HCT 26.3* 11/26/2015   PLT 406* 11/26/2015   LYMPHOPCT 8 11/25/2015  MONOPCT 4 11/25/2015    EOSPCT 0 11/25/2015   BASOPCT 0 11/25/2015    CMP: Lab Results  Component Value Date   NA 144 11/26/2015   NA 142 02/21/2015   K 3.7 11/26/2015   CL 109 11/26/2015   CO2 27 11/26/2015   BUN 55* 11/26/2015   BUN 16 02/21/2015   CREATININE 1.02* 11/26/2015   CREATININE 0.66 09/06/2014   PROT 6.7 11/22/2015   ALBUMIN 2.9* 11/22/2015   BILITOT 0.3 11/22/2015   ALKPHOS 41 11/22/2015   AST 23 11/22/2015   ALT 17 11/22/2015  .   Total Time in preparing paper work, data evaluation and todays exam - 35 minutes  Mikailah Morel M.D on 11/27/2015 at 2:16 PM  [facility]   Office  [phone]

## 2015-11-28 ENCOUNTER — Other Ambulatory Visit: Payer: Medicare HMO

## 2015-11-30 ENCOUNTER — Encounter (HOSPITAL_COMMUNITY)
Admission: AD | Admit: 2015-11-30 | Discharge: 2015-11-30 | Disposition: A | Discharge status: Home / Self Care | Payer: Medicare HMO | Source: Skilled Nursing Facility | Attending: Internal Medicine | Admitting: Internal Medicine

## 2015-12-01 ENCOUNTER — Other Ambulatory Visit: Payer: Self-pay | Admitting: *Deleted

## 2015-12-01 MED ORDER — METHYLPHENIDATE HCL 5 MG PO TABS: ORAL_TABLET | ORAL | Status: AC

## 2015-12-01 NOTE — Telephone Encounter (Signed)
[facility] 

## 2015-12-16 ENCOUNTER — Ambulatory Visit: Payer: Medicare HMO | Admitting: Vascular Surgery

## 2016-01-10 DIAGNOSIS — R4182 Altered mental status, unspecified: Secondary | ICD-10-CM | POA: Insufficient documentation

## 2016-03-13 ENCOUNTER — Encounter (HOSPITAL_COMMUNITY): Payer: Self-pay

## 2016-05-23 ENCOUNTER — Telehealth: Payer: Self-pay | Admitting: Gastroenterology

## 2016-05-23 NOTE — Telephone Encounter (Signed)
Letter sent to pt

## 2016-05-23 NOTE — Telephone Encounter (Signed)
Pt is on the July recall to have colonoscopy. I can not locate in epic when her last one was done.

## 2016-06-11 ENCOUNTER — Encounter (HOSPITAL_COMMUNITY)
Admission: EM | Disposition: A | Discharge status: Other Acute Inpatient Hospital | Payer: Self-pay | Source: Home / Self Care | Attending: Emergency Medicine

## 2016-06-11 ENCOUNTER — Encounter (HOSPITAL_COMMUNITY): Payer: Self-pay | Admitting: Emergency Medicine

## 2016-06-11 ENCOUNTER — Emergency Department (HOSPITAL_COMMUNITY)
Admission: EM | Admit: 2016-06-11 | Discharge: 2016-06-11 | Disposition: A | Discharge status: Other Acute Inpatient Hospital | Attending: Emergency Medicine | Admitting: Emergency Medicine

## 2016-06-11 DIAGNOSIS — K2970 Gastritis, unspecified, without bleeding: Secondary | ICD-10-CM | POA: Diagnosis not present

## 2016-06-11 DIAGNOSIS — Z8673 Personal history of transient ischemic attack (TIA), and cerebral infarction without residual deficits: Secondary | ICD-10-CM | POA: Diagnosis not present

## 2016-06-11 DIAGNOSIS — Z87891 Personal history of nicotine dependence: Secondary | ICD-10-CM | POA: Insufficient documentation

## 2016-06-11 DIAGNOSIS — R1310 Dysphagia, unspecified: Secondary | ICD-10-CM | POA: Diagnosis not present

## 2016-06-11 DIAGNOSIS — Z85858 Personal history of malignant neoplasm of other endocrine glands: Secondary | ICD-10-CM | POA: Insufficient documentation

## 2016-06-11 DIAGNOSIS — T85528A Displacement of other gastrointestinal prosthetic devices, implants and grafts, initial encounter: Secondary | ICD-10-CM

## 2016-06-11 DIAGNOSIS — I10 Essential (primary) hypertension: Secondary | ICD-10-CM | POA: Insufficient documentation

## 2016-06-11 DIAGNOSIS — K9423 Gastrostomy malfunction: Secondary | ICD-10-CM | POA: Diagnosis present

## 2016-06-11 DIAGNOSIS — E119 Type 2 diabetes mellitus without complications: Secondary | ICD-10-CM | POA: Insufficient documentation

## 2016-06-11 DIAGNOSIS — Z7982 Long term (current) use of aspirin: Secondary | ICD-10-CM | POA: Diagnosis not present

## 2016-06-11 DIAGNOSIS — Z431 Encounter for attention to gastrostomy: Secondary | ICD-10-CM

## 2016-06-11 HISTORY — PX: PEG PLACEMENT: SHX5437

## 2016-06-11 HISTORY — PX: ESOPHAGOGASTRODUODENOSCOPY: SHX5428

## 2016-06-11 HISTORY — DX: Cachexia: R64

## 2016-06-11 SURGERY — EGD (ESOPHAGOGASTRODUODENOSCOPY)
Anesthesia: Moderate Sedation

## 2016-06-11 MED ORDER — MIDAZOLAM HCL 5 MG/5ML IJ SOLN
INTRAMUSCULAR | Status: AC
  Filled 2016-06-11: qty 10

## 2016-06-11 MED ORDER — SODIUM CHLORIDE 0.9 % IV SOLN: INTRAVENOUS | Status: DC

## 2016-06-11 MED ORDER — LIDOCAINE 1 % OPTIME INJ - NO CHARGE
INTRAMUSCULAR | Status: DC | PRN
  Administered 2016-06-11: 2 mL via INTRADERMAL

## 2016-06-11 MED ORDER — LIDOCAINE VISCOUS 2 % MT SOLN
OROMUCOSAL | Status: DC | PRN
  Administered 2016-06-11: 2 mL via OROMUCOSAL

## 2016-06-11 MED ORDER — MEPERIDINE HCL 100 MG/ML IJ SOLN
INTRAMUSCULAR | Status: DC | PRN
  Administered 2016-06-11: 25 mg via INTRAVENOUS

## 2016-06-11 MED ORDER — STERILE WATER FOR IRRIGATION IR SOLN
Status: DC | PRN
  Administered 2016-06-11: 16:00:00

## 2016-06-11 MED ORDER — MEPERIDINE HCL 100 MG/ML IJ SOLN
INTRAMUSCULAR | Status: AC
  Filled 2016-06-11: qty 2

## 2016-06-11 MED ORDER — CEFAZOLIN SODIUM-DEXTROSE 2-4 GM/100ML-% IV SOLN: 2.0000 g | Freq: Once | INTRAVENOUS | Status: DC

## 2016-06-11 MED ORDER — MIDAZOLAM HCL 5 MG/5ML IJ SOLN
INTRAMUSCULAR | Status: DC | PRN
  Administered 2016-06-11 (×2): 1 mg via INTRAVENOUS

## 2016-06-11 MED ORDER — LIDOCAINE VISCOUS 2 % MT SOLN
OROMUCOSAL | Status: AC
  Filled 2016-06-11: qty 15

## 2016-06-11 MED ORDER — CEFAZOLIN SODIUM-DEXTROSE 2-4 GM/100ML-% IV SOLN
INTRAVENOUS | Status: AC
  Filled 2016-06-11: qty 100

## 2016-06-11 NOTE — Op Note (Signed)
[facility] Patient Name: Laura Melendez Procedure Date: 06/11/2016 4:02 PM MRN: [id number] Date of Birth: 1941-02-01 Attending MD: Barney Drain , MD CSN: [id number] Age: 75 Admit Type: Outpatient Procedure:                Upper GI endoscopy for PEG PLACEMENT Indications:              Dysphagia-PEG FELL OUT. ED AND DR. Shauntay Brunelli UNABLE TO                            REPLACE G TUBE AT BEDSIDE Providers:                Barney Drain, MD, Rosina Lowenstein, RN, Georgeann Oppenheim,                            Technician Referring MD:             Elayne Snare. Storm Frisk, MD Medicines:                Meperidine 25 mg IV, Midazolam 2 mg IV Complications:            No immediate complications. Estimated Blood Loss:     Estimated blood loss was minimal. Procedure:                Pre-Anesthesia Assessment:                           - Prior to the procedure, a History and Physical                            was performed, and patient medications and                            allergies were reviewed. The patient's tolerance of                            previous anesthesia was also reviewed. The risks                            and benefits of the procedure and the sedation                            options and risks were discussed with the patient.                            All questions were answered, and informed consent                            was obtained. Prior Anticoagulants: The patient has                            taken no previous anticoagulant or antiplatelet                            agents. ASA Grade Assessment: II - A patient with  mild systemic disease. After reviewing the risks                            and benefits, the patient was deemed in                            satisfactory condition to undergo the procedure.                           After obtaining informed consent, the endoscope was                            passed under direct vision.  Throughout the                            procedure, the patient's blood pressure, pulse, and                            oxygen saturations were monitored continuously. The                            [id number] scope was introduced through the                            mouth, and advanced to the second part of duodenum.                            The upper GI endoscopy was accomplished with ease.                            The patient tolerated the procedure well. Scope In: 4:10:24 PM Scope Out: 4:22:41 PM Total Procedure Duration: 0 hours 12 minutes 17 seconds  Findings:      The examined esophagus was normal.      Scattered mild inflammation characterized by erythema was found in the       stomach. The patient was placed in the supine position for PEG       placement. The stomach was insufflated to appose gastric and abdominal       walls. A site was located in the body of the stomach with excellent       transillumination for placement. The abdominal wall was marked and       prepped in a sterile manner. The area was anesthetized with 3 mL of 0.5%       lidocaine. The trocar needle was introduced through the abdominal wall       and into the stomach under direct endoscopic view. A snare was       introduced through the endoscope and opened in the gastric lumen. The       guide wire was passed through the trocar and into the open snare. The       snare was closed around the guide wire. The endoscope and snare were       removed, pulling the wire out through the mouth. A skin incision was       made at the site of needle insertion. The 20 Fr Bard gastrostomy  tube       was lubricated. The G-tube was tied to the guide wire and pulled through       the mouth and into the stomach. The trocar needle was removed, and the       gastrostomy tube was pulled out from the stomach through the skin. The       external bumper was attached to the gastrostomy tube, and the tube was       cut  to remove the guide wire. The final position of the gastrostomy tube       was confirmed by relook endoscopy, and skin marking noted to be 2 cm and       TOP OF BUMPER AT 3 cm . The final tension and compression of the       abdominal wall by the PEG tube and external bumper were checked and       revealed that the bumper was loose and lightly touching the skin and       that the PEG balloon was moderately tight and mildly compressing the       stomach. The feeding tube was capped, and the tube site cleaned and       dressed.      The examined duodenum was normal. Impression:               - Normal esophagus.                           - Gastritis.                           - Normal examined duodenum.                           - A PEG placement was successfully completed.                           - No specimens collected. Moderate Sedation:      Moderate (conscious) sedation was administered by the endoscopy nurse       and supervised by the endoscopist. The following parameters were       monitored: oxygen saturation, heart rate, blood pressure, and response       to care. Total physician intraservice time was 15 minutes. Recommendation:           - Resume previous diet.                           - Continue present medications.                           - Patient has a contact number available for                            emergencies. The signs and symptoms of potential                            delayed complications were discussed with the                            patient. Return to normal  activities tomorrow.                            Written discharge instructions were provided to the                            patient.                           - Please follow the post-PEG recommendations                            including: advance food and medications per primary                            care provider. Procedure Code(s):        --- Professional ---                            [phone], Esophagogastroduodenoscopy, flexible,                            transoral; with directed placement of percutaneous                            gastrostomy tube                           G0500, Moderate sedation services provided by the                            same physician or other qualified health care                            professional performing a gastrointestinal                            endoscopic service that sedation supports,                            requiring the presence of an independent trained                            observer to assist in the monitoring of the                            patient's level of consciousness and physiological                            status; initial 15 minutes of intra-service time;                            patient age 49 years or older (additional time may                            be reported with [phone], as appropriate) Diagnosis Code(s):        ---  Professional ---                           K29.70, Gastritis, unspecified, without bleeding                           R13.10, Dysphagia, unspecified CPT copyright 2016 [employer]. All rights reserved. The codes documented in this report are preliminary and upon coder review may  be revised to meet current compliance requirements. Barney Drain, MD Barney Drain, MD 06/11/2016 8:34:08 PM This report has been signed electronically. Number of Addenda: 0

## 2016-06-11 NOTE — ED Notes (Signed)
Patient arrives via EMS from home where she is on hospice. Peg tube was removed at home, here for replacement. Patient non-verbal at baseline.

## 2016-06-11 NOTE — ED Notes (Signed)
EDP at bedside  

## 2016-06-11 NOTE — Consult Note (Signed)
Primary Care Physician:  [name], MD Primary Gastroenterologist:  Dr. Oneida Alar  Pre-Procedure History & Physical: HPI:  Laura Melendez is a 75 y.o. female here for Peg fell out AND NO FOLEY N PLACE. ATTEMPTED TO DILATE TRACT BUT UNABLE TO PASS LARGE DILATORS FOR A 20 FR PEG,   Past Medical History  Diagnosis Date  . Essential hypertension, benign   . Type 2 diabetes mellitus ([place])   . Osteoporosis 06/2008  . 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 History of parotid cancer   . Cachexia [facility])     Past Surgical History  Procedure Laterality Date  . Ankle surgery Left 2004    otif  . Abdominal hysterectomy    . Anterior and posterior repair N/A 03/11/2013    Procedure: ANTERIOR (CYSTOCELE) AND POSTERIOR REPAIR (RECTOCELE);  Surgeon: Linda Hedges, DO;  Location: [place] [facility];  Service: Gynecology;  Laterality: N/A;  with sacrospinous ligament fixation bilateral  . Colonoscopy  2009    negative  . Cataract extraction w/phaco Right 12/27/2014    Procedure: CATARACT EXTRACTION PHACO AND INTRAOCULAR LENS PLACEMENT RIGHT EYE;  Surgeon: Tonny Branch, MD;  Location: [facility];  Service: Ophthalmology;  Laterality: Right;  CDE:10.63  . Cataract extraction w/phaco Left 01/13/2015    Procedure: CATARACT EXTRACTION PHACO AND INTRAOCULAR LENS PLACEMENT LEFT EYE;  Surgeon: Tonny Branch, MD;  Location: [facility];  Service: Ophthalmology;  Laterality: Left;  CDE:18.87  . Cholecystectomy    . Tee without cardioversion N/A 04/01/2015    Procedure: TRANSESOPHAGEAL ECHOCARDIOGRAM (TEE);  Surgeon: Sanda Klein, MD;  Location: [facility] ENDOSCOPY;  Service: Cardiovascular;  Laterality: N/A;  . Peg placement    . Parotidectomy    . Peg placement N/A 11/16/2015    Procedure: PERCUTANEOUS ENDOSCOPIC GASTROSTOMY (PEG) REPLACEMENT;  Surgeon: Rogene Houston, MD;  Location: [facility];  Service: Endoscopy;  Laterality: N/A;    Prior to Admission medications   Medication Sig Start Date End Date Taking? Authorizing  Provider  aspirin 81 MG chewable tablet Chew 81 mg by mouth daily.   Yes Historical Provider, MD  FLUoxetine (PROZAC) 10 MG capsule TAKE 1 CAPSULE BY MOUTH DAILY FOR DEPRESSION/ANXIETY Patient taking differently: TAKE 1 CAPSULE VIA PEG DAILY FOR DEPRESSION/ANXIETY 09/15/15  Yes [name], MD  furosemide (LASIX) 20 MG tablet 20 mg by Gastric Tube route daily.    Yes Historical Provider, MD  levETIRAcetam (KEPPRA) 100 MG/ML solution Take 750-1,500 mg by mouth 2 (two) times daily. 7.5 ml in the morning and 15 ml at bedtime.   Yes Historical Provider, MD  methylphenidate (RITALIN) 5 MG tablet Take one tablet per tube twice daily with breakfast and lunch 12/01/15  Yes Lauree Chandler, NP  albuterol (PROVENTIL) (2.5 MG/3ML) 0.083% nebulizer solution Take 3 mLs (2.5 mg total) by nebulization every 2 (two) hours as needed for wheezing. Patient not taking: Reported on 06/11/2016 11/15/15   Orvan Falconer, MD  levETIRAcetam (KEPPRA) 750 MG tablet TAKE 1 TABLET BY MOUTH EVERY MORNING AND 2 TABLETS BY MOUTH EVERY NIGHT AT BEDTIME Patient not taking: Reported on 06/11/2016 08/01/15   [name], MD  nystatin cream (MYCOSTATIN) Apply 1 application topically 2 (two) times daily. Applied to vaginal rash until resolution    Historical Provider, MD  Water For Irrigation, Sterile (FREE WATER) SOLN Place 200 mLs into feeding tube every 8 (eight) hours. 10/21/15   [name], MD    Allergies as of 06/11/2016  . (No Known Allergies)  Family History  Problem Relation Age of Onset  . Cancer Father     Liver  . Cancer Sister     Breast  . Diabetes Brother   . Dementia Mother     Social History   Social History  . Marital Status: Married    Spouse Name: N/A  . Number of Children: 3  . Years of Education: 12   Occupational History  . Retired    Social History Main Topics  . Smoking status: Former Smoker -- 0.25 packs/day for 30 years    Types: Cigarettes    Quit date: 02/27/2015  . Smokeless  tobacco: Not on file  . Alcohol Use: No  . Drug Use: No  . Sexual Activity: Yes    Birth Control/ Protection: Surgical   Other Topics Concern  . Not on file   Social History Narrative   Lives at home with her husband.   Right-handed.   2 cups caffeine daily.    Review of Systems: See HPI, otherwise negative ROS   Physical Exam: BP 141/95 mmHg  Pulse 94  Temp(Src) 97.7 F (36.5 C) (Oral)  Resp 18  Wt 85 lb (38.556 kg)  SpO2 90% General:   Alert,  pleasant and cooperative, APHASIC, CACHECTIC Head:  Normocephalic and atraumatic. Neck:  Supple; Lungs:  Clear throughout to auscultation.    Heart:  Regular rate and rhythm. Abdomen:  Soft, nontender and nondistended. Normal bowel sounds, without guarding, and without rebound.   Neurologic:  Alert, NO  NEW FOCAL DEFICITS.  Impression/Plan:   Peg fell out. NO Foley in place. UNABLE TO REPLACE PEG AT BEDSIDE.  PLAN: 1. EGD TO REPLACE PEG. DISCUSSED PROCEDURE, BENEFITS, & RISKS WITH HUSBAND AND DAUGHTER: < 1% chance of medication reaction, PERFORATION, BLEEDING.

## 2016-06-11 NOTE — ED Provider Notes (Signed)
CSN: [id number]     Arrival date & time 06/11/16  [id number] History  By signing my name below, I, Laura Melendez, attest that this documentation has been prepared under the direction and in the presence of Merrily Pew, MD. Electronically Signed: Irene Melendez, ED Scribe. 06/11/2016. 7:19 AM.   Chief Complaint  Patient presents with  . Peg Tube Problem    The history is provided by the EMS personnel. No language interpreter was used.  HPI Comments (Level 5 Caveat due to pt non-verbal at baseline): Laura Melendez is a 75 y.o. Female with a hx of stroke, parotid cancer, cachexia, and Type II DM brought in by EMS who presents to the Emergency Department complaining of peg tube replacement. Pt is on hospice at home. Per nurse, peg tube was removed at some point in the last 24 hours. Mechanism of removal is unknown.   Past Medical History  Diagnosis Date  . Essential hypertension, benign   . Type 2 diabetes mellitus ([place])   . Osteoporosis 06/2008  . 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 History of parotid cancer   . Cachexia [facility])    Past Surgical History  Procedure Laterality Date  . Ankle surgery Left 2004    otif  . Abdominal hysterectomy    . Anterior and posterior repair N/A 03/11/2013    Procedure: ANTERIOR (CYSTOCELE) AND POSTERIOR REPAIR (RECTOCELE);  Surgeon: Linda Hedges, DO;  Location: [place] [facility];  Service: Gynecology;  Laterality: N/A;  with sacrospinous ligament fixation bilateral  . Colonoscopy  2009    negative  . Cataract extraction w/phaco Right 12/27/2014    Procedure: CATARACT EXTRACTION PHACO AND INTRAOCULAR LENS PLACEMENT RIGHT EYE;  Surgeon: Tonny Branch, MD;  Location: [facility];  Service: Ophthalmology;  Laterality: Right;  CDE:10.63  . Cataract extraction w/phaco Left 01/13/2015    Procedure: CATARACT EXTRACTION PHACO AND INTRAOCULAR LENS PLACEMENT LEFT EYE;  Surgeon: Tonny Branch, MD;  Location: [facility];  Service: Ophthalmology;  Laterality: Left;  CDE:18.87  .  Cholecystectomy    . Tee without cardioversion N/A 04/01/2015    Procedure: TRANSESOPHAGEAL ECHOCARDIOGRAM (TEE);  Surgeon: Sanda Klein, MD;  [facility] ENDOSCOPY;  Service: Cardiovascular;  Laterality: N/A;  . Peg placement    . Parotidectomy    . Peg placement N/A 11/16/2015    Procedure: PERCUTANEOUS ENDOSCOPIC GASTROSTOMY (PEG) REPLACEMENT;  Surgeon: Rogene Houston, MD;  Location[facility];  Service: Endoscopy;  Laterality: N/A;   Family History  Problem Relation Age of Onset  . Cancer Father     Liver  . Cancer Sister     Breast  . Diabetes Brother   . Dementia Mother    Social History  Substance Use Topics  . Smoking status: Former Smoker -- 0.25 packs/day for 30 years    Types: Cigarettes    Quit date: 02/27/2015  . Smokeless tobacco: None  . Alcohol Use: No   OB History    No data available     Review of Systems  Unable to perform ROS: Patient nonverbal   Allergies  Review of patient's allergies indicates no known allergies.  Home Medications   Prior to Admission medications   Medication Sig Start Date End Date Taking? Authorizing Provider  aspirin 81 MG chewable tablet Chew 81 mg by mouth daily.   Yes Historical Provider, MD  FLUoxetine (PROZAC) 10 MG capsule TAKE 1 CAPSULE BY MOUTH DAILY FOR DEPRESSION/ANXIETY Patient taking differently: TAKE 1 CAPSULE VIA PEG DAILY  FOR DEPRESSION/ANXIETY 09/15/15  Yes [name], MD  furosemide (LASIX) 20 MG tablet 20 mg by Gastric Tube route daily.    Yes Historical Provider, MD  levETIRAcetam (KEPPRA) 100 MG/ML solution Take 750-1,500 mg by mouth 2 (two) times daily. 7.5 ml in the morning and 15 ml at bedtime.   Yes Historical Provider, MD  methylphenidate (RITALIN) 5 MG tablet Take one tablet per tube twice daily with breakfast and lunch 12/01/15  Yes Lauree Chandler, NP  nystatin cream (MYCOSTATIN) Apply 1 application topically 2 (two) times daily. Applied to vaginal rash until resolution    Historical  Provider, MD  Water For Irrigation, Sterile (FREE WATER) SOLN Place 200 mLs into feeding tube every 8 (eight) hours. 10/21/15   [name], MD   BP 115/70 mmHg  Pulse 69  Temp(Src) 97.2 F (36.2 C) (Axillary)  Resp 15  Ht 5' (1.524 m)  Wt 85 lb (38.556 kg)  BMI 16.60 kg/m2  SpO2 100% Physical Exam  Constitutional: She is oriented to person, place, and time. She appears well-developed and well-nourished.  HENT:  Head: Normocephalic and atraumatic.  Eyes: EOM are normal. Pupils are equal, round, and reactive to light.  Neck: Normal range of motion. Neck supple.  Cardiovascular: Normal rate, regular rhythm and normal heart sounds.  Exam reveals no gallop and no friction rub.   No murmur heard. Pulmonary/Chest: Effort normal and breath sounds normal. She has no wheezes.  Abdominal: Soft. There is no tenderness.  Musculoskeletal: Normal range of motion.  Neurological: She is alert and oriented to person, place, and time.  Skin: Skin is warm and dry.  Psychiatric: She has a normal mood and affect. Her behavior is normal.  Nursing note and vitals reviewed.   ED Course  Procedures (including critical care time) DIAGNOSTIC STUDIES: Oxygen Saturation is 97% on RA, normal by my interpretation.    COORDINATION OF CARE: 10:06 AM-peg tube replacement  Labs Review Labs Reviewed - No data to display  Imaging Review No results found. I have personally reviewed and evaluated these images and lab results as part of my medical decision-making.   EKG Interpretation None      MDM   Final diagnoses:  Dislodged gastrostomy tube ([place])    PEG tube came out sometime in the last 12-14 hours. Not able to put it back in myself, discussed with Dr. Oneida Alar in GI and will take to endoscopy suite to try and reinsert it. Transferred to endo suite for further management and likely discharge.   Merrily Pew, MD 06/12/16 [phone]

## 2016-06-11 NOTE — Discharge Instructions (Signed)
You can USE TUBE FOR TUBE FEEDS/MEDS TODAY.   ENDOSCOPY Care After Read the instructions outlined below and refer to this sheet in the next week. These discharge instructions provide you with general information on caring for yourself after you leave the hospital. While your treatment has been planned according to the most current medical practices available, unavoidable complications occasionally occur. If you have any problems or questions after discharge, call DR. Clary Meeker, [phone].  ACTIVITY  You may resume your regular activity, but move at a slower pace for the next 24 hours.   Take frequent rest periods for the next 24 hours.   Walking will help get rid of the air and reduce the bloated feeling in your belly (abdomen).   No driving for 24 hours (because of the medicine (anesthesia) used during the test).   You may shower.   Do not sign any important legal documents or operate any machinery for 24 hours (because of the anesthesia used during the test).    NUTRITION  Drink plenty of fluids.   You may resume your normal diet as instructed by your doctor.   Begin with a light meal and progress to your normal diet. Heavy or fried foods are harder to digest and may make you feel sick to your stomach (nauseated).   Avoid alcoholic beverages for 24 hours or as instructed.    MEDICATIONS  You may resume your normal medications.   WHAT YOU CAN EXPECT TODAY  Some feelings of bloating in the abdomen.   Passage of more gas than usual.   Spotting of blood in your stool or on the toilet paper  .  IF YOU HADBIOPSIES TAKEN  DURING THE SIGMOIDOSCOPY/UPPER ENDOSCOPY:  Eat a soft diet IF YOU HAVE NAUSEA, BLOATING, ABDOMINAL PAIN, OR VOMITING.    FINDING OUT THE RESULTS OF YOUR TEST Not all test results are available during your visit. DR. Oneida Alar WILL CALL YOU WITHIN 7 DAYS OF YOUR PROCEDUE WITH YOUR RESULTS. Do not assume everything is normal if you have not heard from DR.  Maveryck Bahri IN ONE WEEK, CALL HER OFFICE AT [phone].  SEEK IMMEDIATE MEDICAL ATTENTION AND CALL THE OFFICE: [phone] IF:  You have more than a spotting of blood in your stool.   Your belly is swollen (abdominal distention).   You are nauseated or vomiting.   You have a temperature over 101F.   You have abdominal pain or discomfort that is severe or gets worse throughout the day.

## 2016-06-21 ENCOUNTER — Encounter (HOSPITAL_COMMUNITY): Payer: Self-pay | Admitting: Gastroenterology

## 2016-08-17 DEATH — deceased

## 2016-12-02 IMAGING — CT CT ABD-PELV W/O CM
2 of 4 series · 16 of 46 positions shown, 18 images · non-contrast
Comparison: None.

CLINICAL DATA: Abdominal pain.  Sepsis.

EXAM:
CT ABDOMEN AND PELVIS WITHOUT CONTRAST
TECHNIQUE: Multidetector CT imaging of the abdomen and pelvis was performed
following the standard protocol without IV contrast.

[Series 2: abd/ pelvis 5.0 i30f 1 · axial · 0.67mm/px · z∈[-408,+2]mm · 13 of 90 slices shown, 15 images]
[im 4/90  soft-tissue]
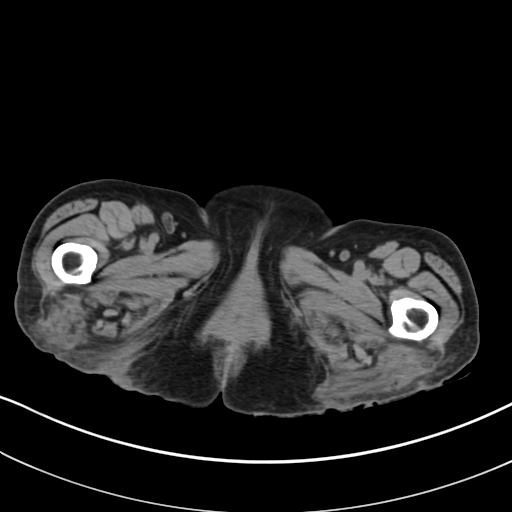
[im 4/90  bone]
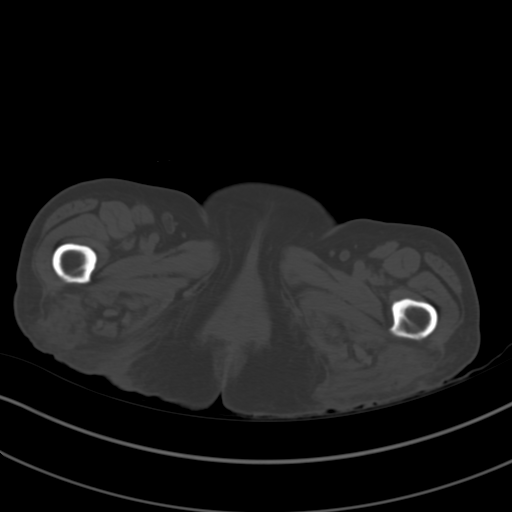
[im 12/90  soft-tissue]
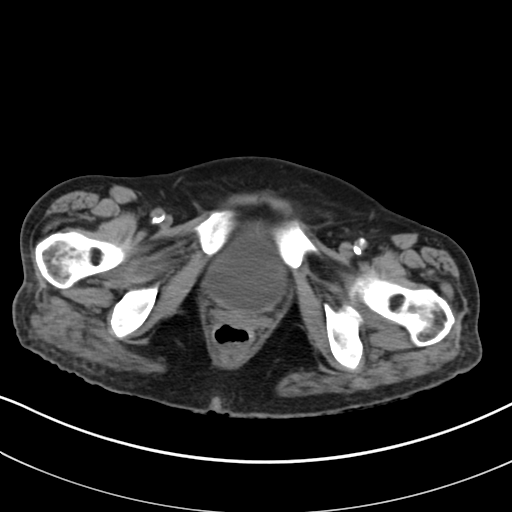
[im 19/90  soft-tissue]
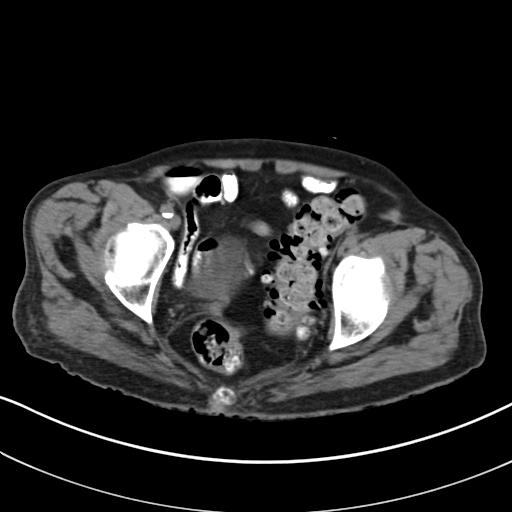
[im 26/90  soft-tissue]
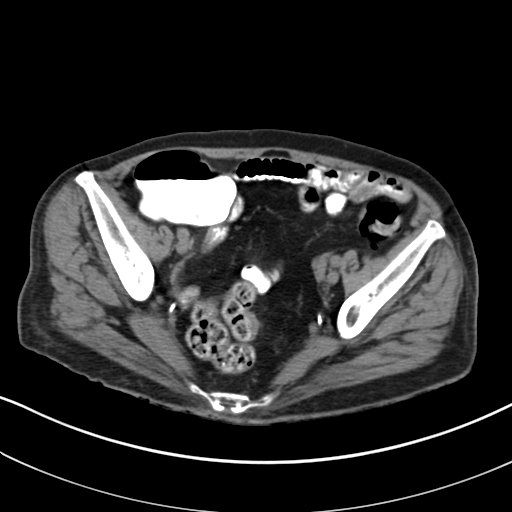
[im 30/90  soft-tissue]
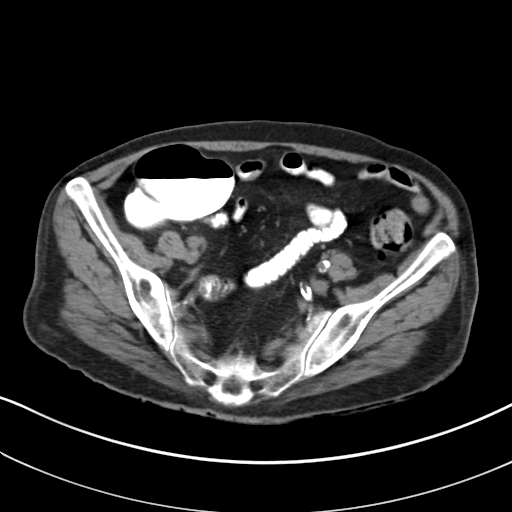
[im 38/90  soft-tissue]
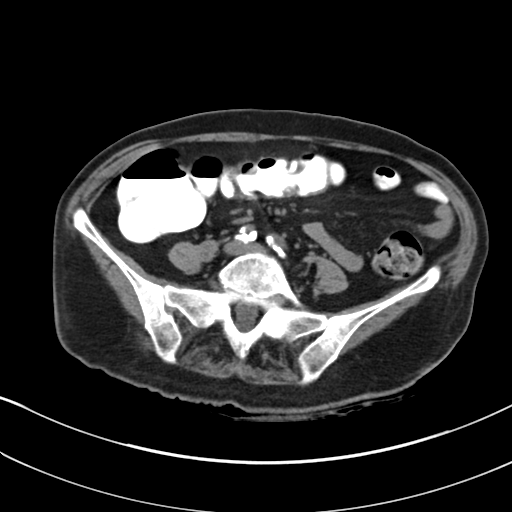
[im 45/90  soft-tissue]
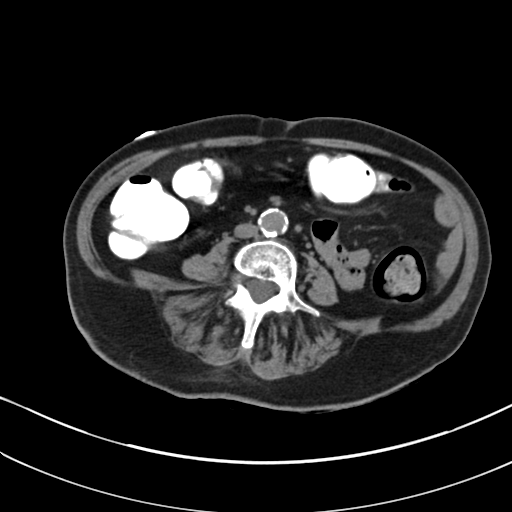
[im 52/90  soft-tissue]
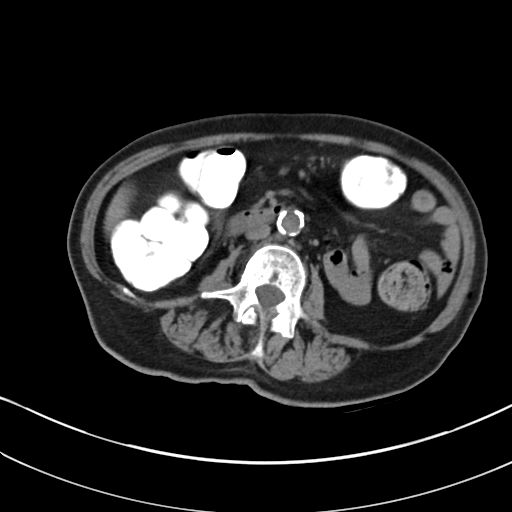
[im 60/90  soft-tissue]
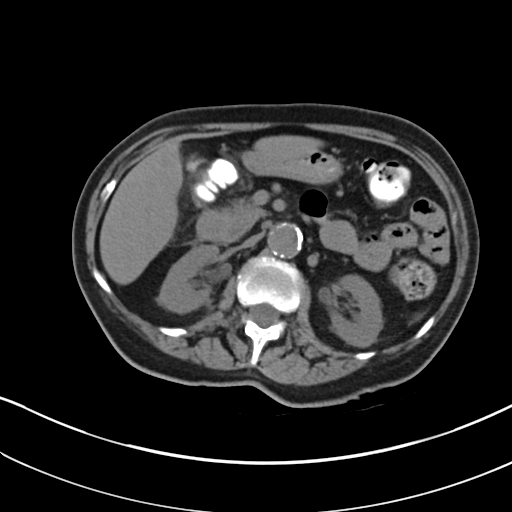
[im 60/90  bone]
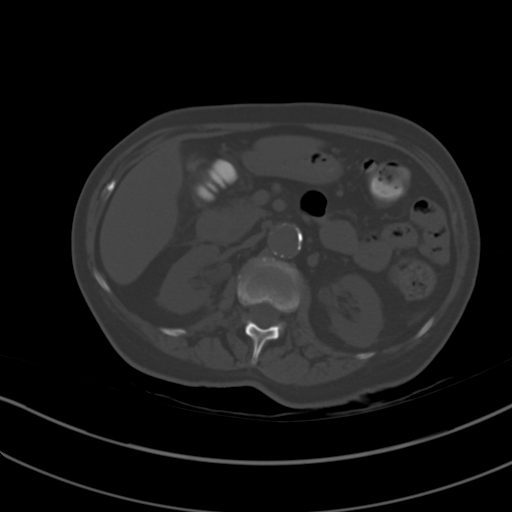
[im 64/90  soft-tissue]
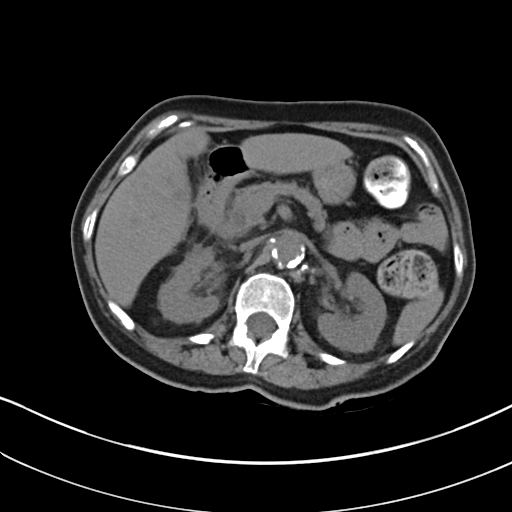
[im 71/90  soft-tissue]
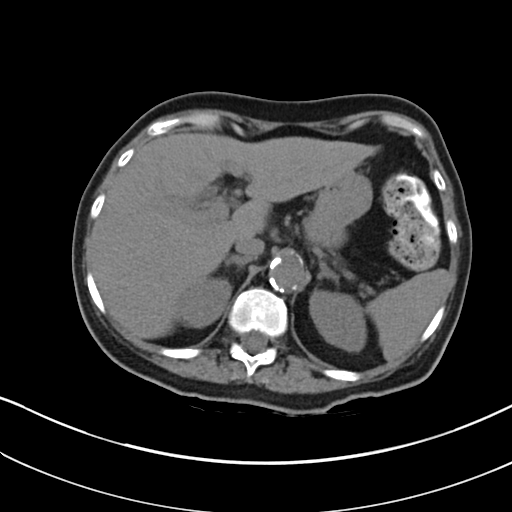
[im 78/90  soft-tissue]
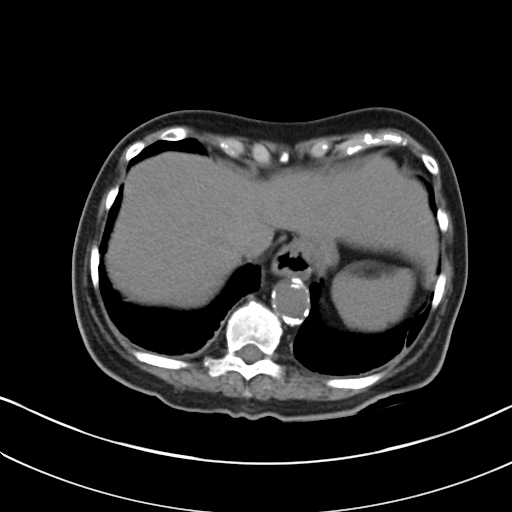
[im 86/90  soft-tissue]
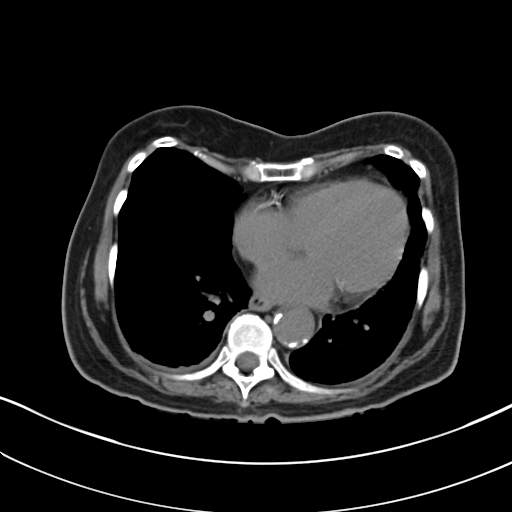

[Series 5: coronals · coronal · 0.70mm/px · 3 of 111 slices shown]
[im 37/111  soft-tissue]
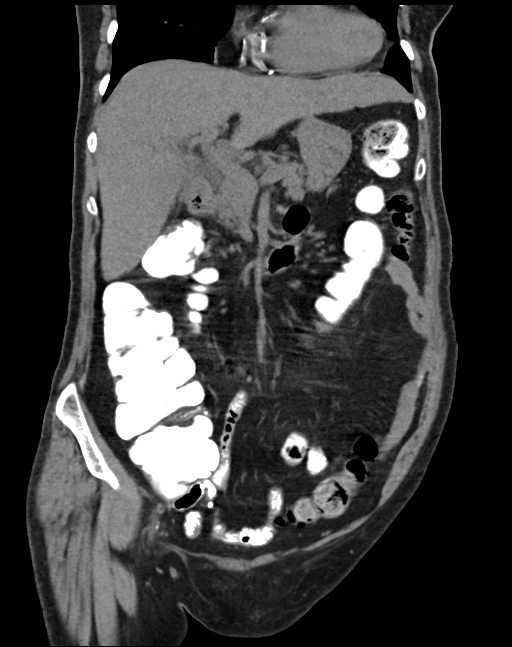
[im 49/111  soft-tissue]
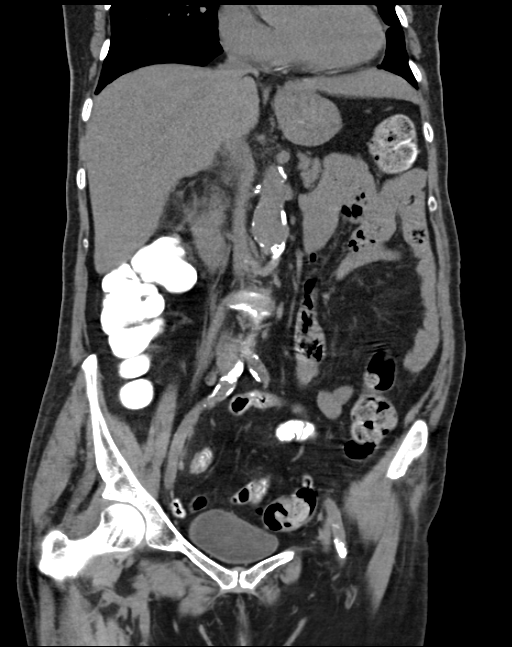
[im 62/111  soft-tissue]
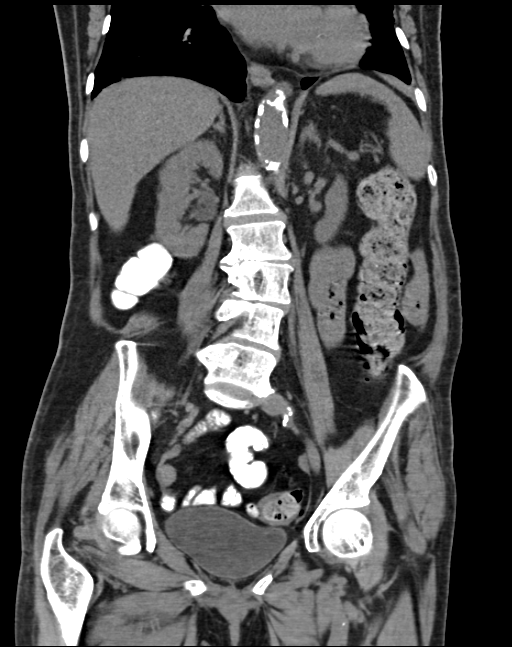

[16 of 46 positions shown; findings below may reference images not displayed]

FINDINGS: There are unremarkable unenhanced appearances of the liver, spleen,
pancreas, adrenals and kidneys. There probably has been
cholecystectomy and hysterectomy. There is no bile duct dilatation.
There is no hydronephrosis or ureteral dilatation.

There is a small hiatal hernia. There is extensive colonic
diverticulosis. There is no evidence of diverticulitis or other
acute inflammatory process in the abdomen or pelvis. There is no
bowel obstruction. There is no free air. There is no ascites.

No significant abnormalities are evident in the lower chest.

No relevant skeletal abnormalities are evident. There is severe
degenerative disc disease at L2-3. There is a unilateral pars defect
on the right at L5.
IMPRESSION: *No acute findings are evident in the abdomen or pelvis. Etiology of
sepsis is not identified on this scan.
*Uncomplicated diverticulosis
*Small hiatal hernia

## 2016-12-02 IMAGING — DX DG CHEST 2V
3 series · 3 of 3 positions shown · non-contrast
Comparison: 01/28/2015

CLINICAL DATA: Sepsis.

EXAM:
CHEST  2 VIEW

[chest pa]
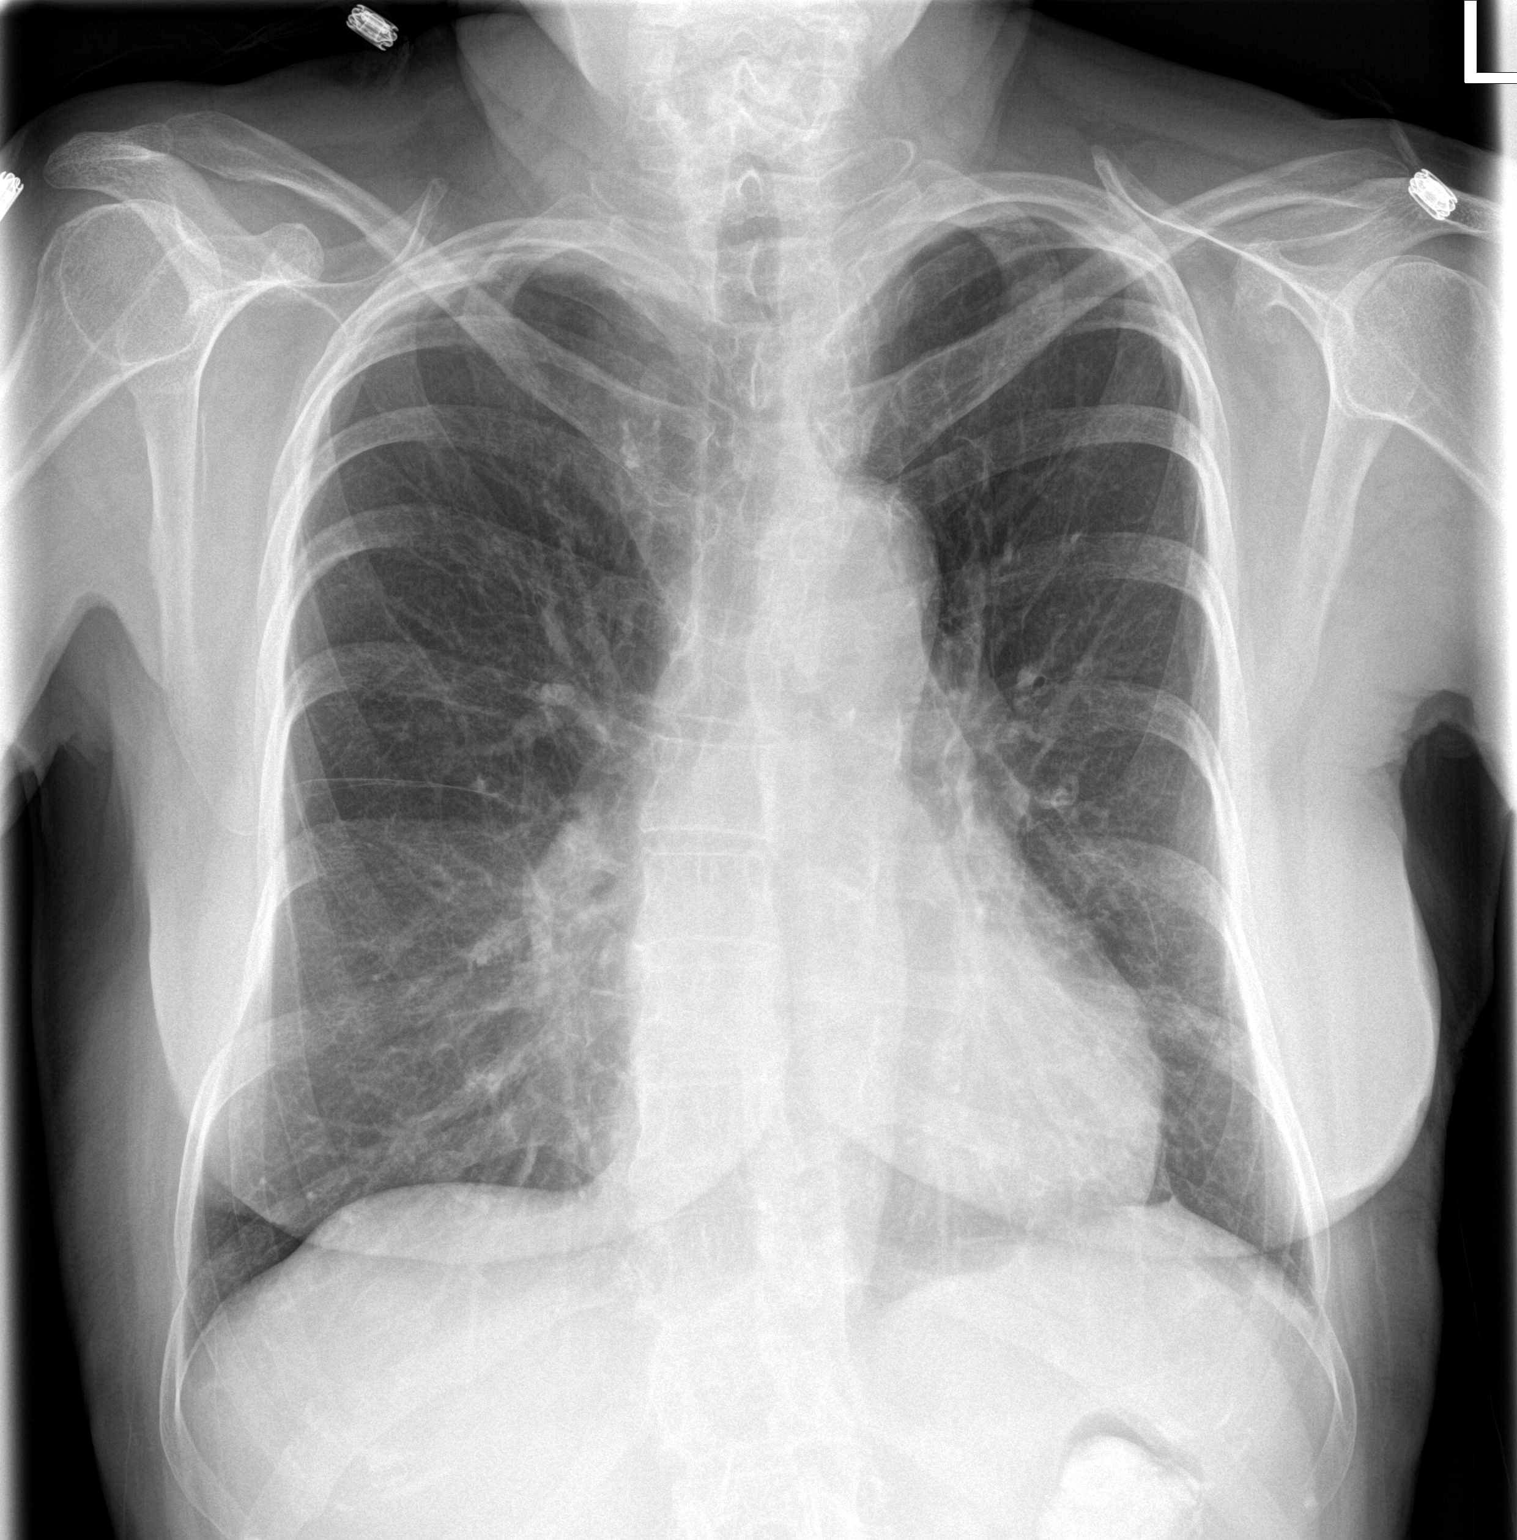

[chest lat (1 of 2)]
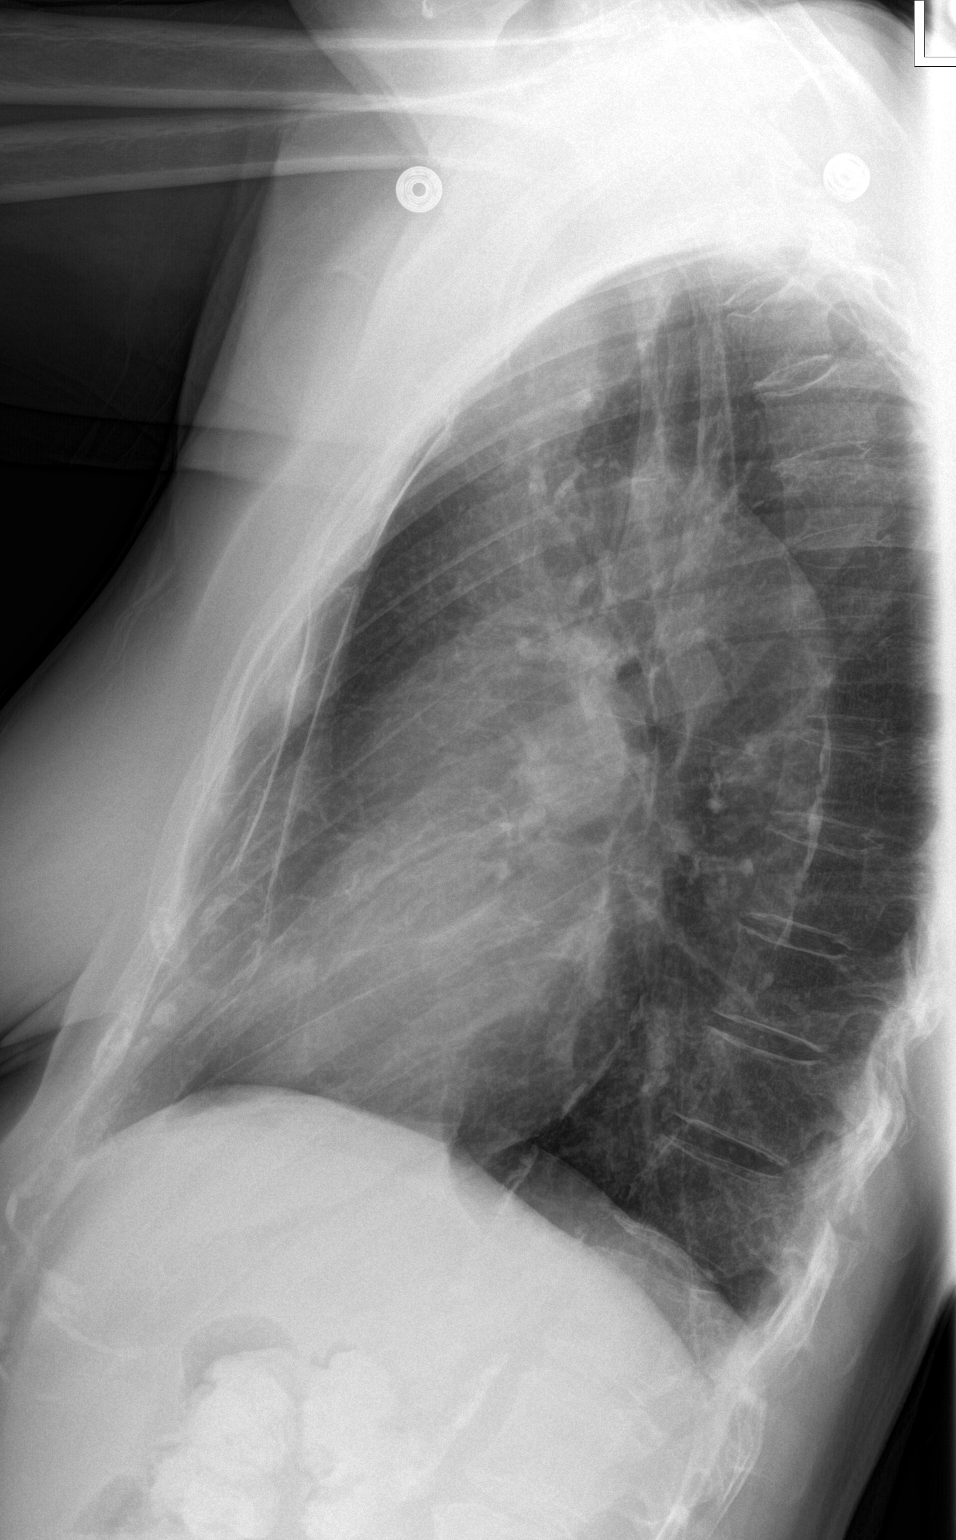

[chest lat (2 of 2)]
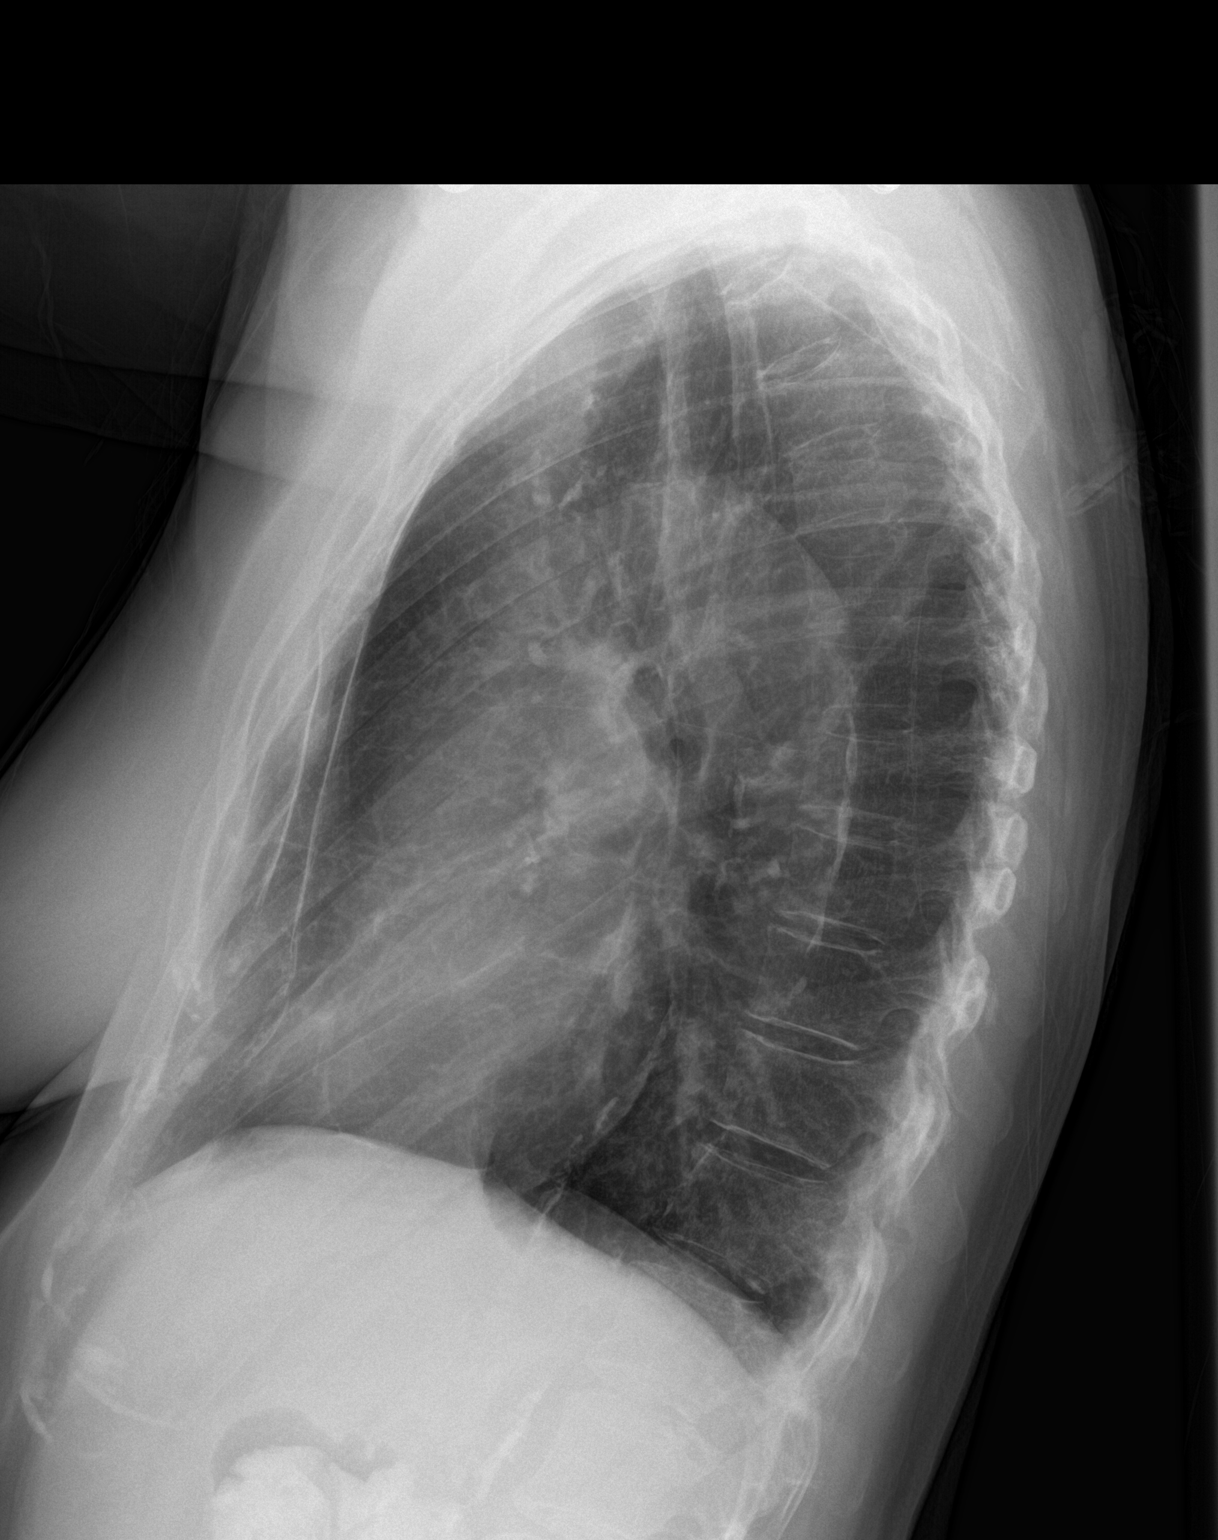

[3 of 3 positions shown; findings below may reference images not displayed]

FINDINGS: Lungs are hyperinflated. The heart is mildly enlarged. No focal
consolidations or pleural effusions. Perihilar peribronchial
thickening. No pulmonary edema.
IMPRESSION: 1. Bronchitic changes.
2. Cardiomegaly.

## 2017-08-15 IMAGING — DX DG CHEST 1V
1 series · 1 of 1 positions shown · non-contrast
Comparison: 10/08/2015

CLINICAL DATA: Cough, aspiration pneumonia, type II diabetes
mellitus, essential hypertension

EXAM:
CHEST 1 VIEW

[chest ap]
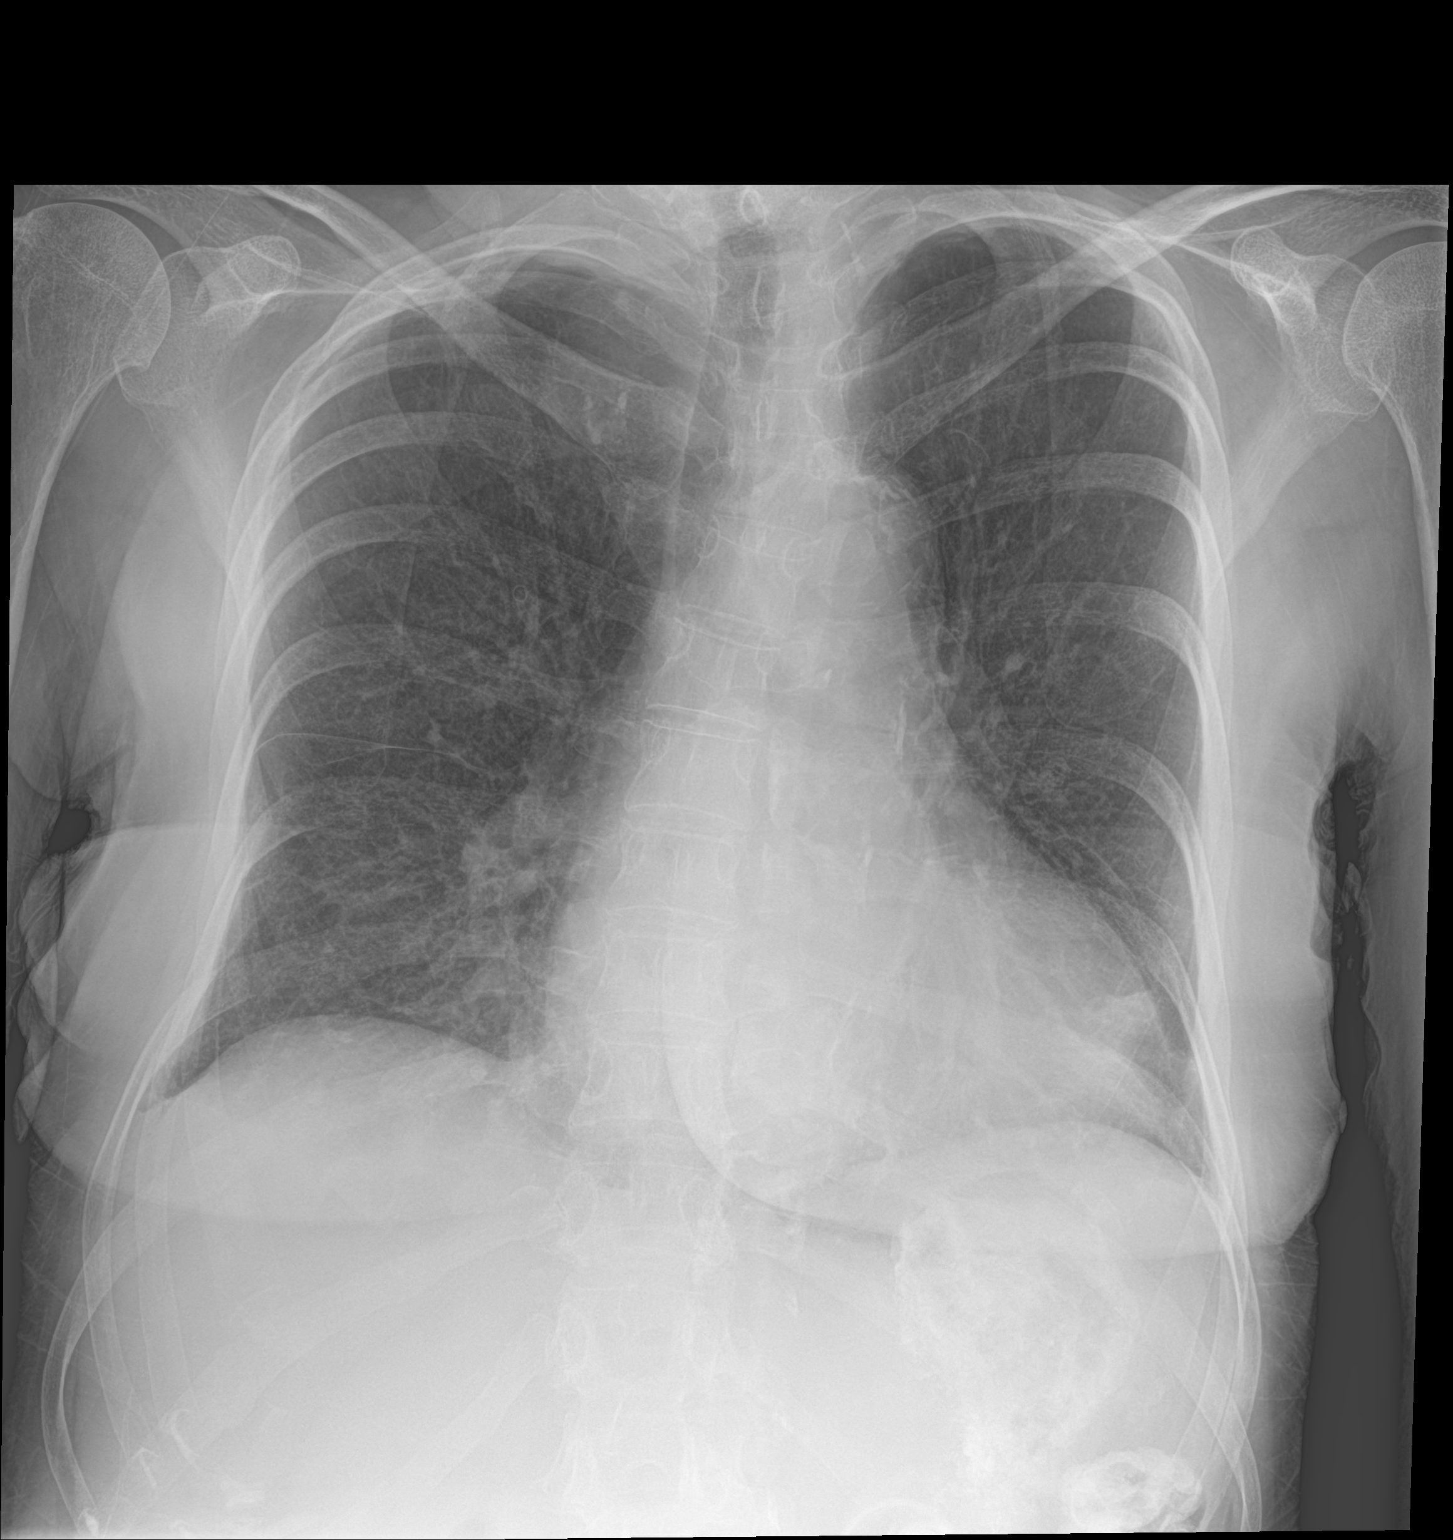

[1 of 1 positions shown; findings below may reference images not displayed]

FINDINGS: Enlargement of cardiac silhouette.

Atherosclerotic calcification aorta.

Mediastinal contours and pulmonary vascularity normal.

Question minimal atelectasis at lung bases.

Minimal central peribronchial thickening.

No infiltrate, pleural effusion or pneumothorax.

Bones demineralized.

Retained contrast in abdomen.
IMPRESSION: Minimal bronchitic changes and basilar atelectasis.
